# Patient Record
Sex: Female | Born: 1935
Health system: Southern US, Community
[De-identification: ages and names within clinical notes are randomized; demographics above are authoritative.]

## PROBLEM LIST (undated history)

## (undated) DIAGNOSIS — E119 Type 2 diabetes mellitus without complications: Secondary | ICD-10-CM

## (undated) DIAGNOSIS — I428 Other cardiomyopathies: Secondary | ICD-10-CM

## (undated) DIAGNOSIS — E875 Hyperkalemia: Secondary | ICD-10-CM

## (undated) DIAGNOSIS — Z9289 Personal history of other medical treatment: Secondary | ICD-10-CM

## (undated) DIAGNOSIS — Z972 Presence of dental prosthetic device (complete) (partial): Secondary | ICD-10-CM

## (undated) DIAGNOSIS — I10 Essential (primary) hypertension: Secondary | ICD-10-CM

## (undated) DIAGNOSIS — I951 Orthostatic hypotension: Secondary | ICD-10-CM

## (undated) DIAGNOSIS — K219 Gastro-esophageal reflux disease without esophagitis: Secondary | ICD-10-CM

## (undated) DIAGNOSIS — I493 Ventricular premature depolarization: Secondary | ICD-10-CM

## (undated) DIAGNOSIS — I00 Rheumatic fever without heart involvement: Secondary | ICD-10-CM

## (undated) DIAGNOSIS — I5042 Chronic combined systolic (congestive) and diastolic (congestive) heart failure: Secondary | ICD-10-CM

## (undated) DIAGNOSIS — E785 Hyperlipidemia, unspecified: Secondary | ICD-10-CM

## (undated) DIAGNOSIS — N184 Chronic kidney disease, stage 4 (severe): Secondary | ICD-10-CM

## (undated) DIAGNOSIS — I4891 Unspecified atrial fibrillation: Secondary | ICD-10-CM

## (undated) DIAGNOSIS — D649 Anemia, unspecified: Secondary | ICD-10-CM

## (undated) DIAGNOSIS — N189 Chronic kidney disease, unspecified: Secondary | ICD-10-CM

## (undated) HISTORY — DX: Hyperlipidemia, unspecified: E78.5

## (undated) HISTORY — DX: Unspecified atrial fibrillation: I48.91

## (undated) HISTORY — DX: Type 2 diabetes mellitus without complications: E11.9

## (undated) HISTORY — DX: Rheumatic fever without heart involvement: I00

## (undated) HISTORY — DX: Orthostatic hypotension: I95.1

## (undated) HISTORY — DX: Chronic kidney disease, unspecified: N18.9

## (undated) HISTORY — DX: Ventricular premature depolarization: I49.3

## (undated) HISTORY — PX: OVARIAN CYST REMOVAL: SHX89

## (undated) HISTORY — DX: Personal history of other medical treatment: Z92.89

## (undated) HISTORY — DX: Essential (primary) hypertension: I10

## (undated) HISTORY — DX: Hyperkalemia: E87.5

---

## 1999-02-28 ENCOUNTER — Other Ambulatory Visit: Admission: RE | Admit: 1999-02-28 | Discharge: 1999-02-28 | Payer: Self-pay | Admitting: *Deleted

## 2000-11-25 ENCOUNTER — Encounter: Payer: Self-pay | Admitting: Family Medicine

## 2000-11-25 ENCOUNTER — Encounter: Admission: RE | Admit: 2000-11-25 | Discharge: 2000-11-25 | Payer: Self-pay | Admitting: Family Medicine

## 2000-12-19 ENCOUNTER — Encounter: Payer: Self-pay | Admitting: Family Medicine

## 2000-12-19 ENCOUNTER — Encounter: Admission: RE | Admit: 2000-12-19 | Discharge: 2000-12-19 | Payer: Self-pay | Admitting: Family Medicine

## 2002-09-09 ENCOUNTER — Ambulatory Visit (HOSPITAL_COMMUNITY): Admission: RE | Admit: 2002-09-09 | Discharge: 2002-09-09 | Payer: Self-pay | Admitting: Gastroenterology

## 2002-09-09 ENCOUNTER — Encounter (INDEPENDENT_AMBULATORY_CARE_PROVIDER_SITE_OTHER): Payer: Self-pay | Admitting: *Deleted

## 2003-03-17 ENCOUNTER — Encounter: Payer: Self-pay | Admitting: Family Medicine

## 2003-03-17 ENCOUNTER — Encounter: Admission: RE | Admit: 2003-03-17 | Discharge: 2003-03-17 | Payer: Self-pay | Admitting: Family Medicine

## 2007-10-23 HISTORY — PX: KNEE SURGERY: SHX244

## 2007-11-03 ENCOUNTER — Encounter: Admission: RE | Admit: 2007-11-03 | Discharge: 2007-12-01 | Payer: Self-pay | Admitting: Sports Medicine

## 2007-12-04 ENCOUNTER — Encounter: Admission: RE | Admit: 2007-12-04 | Discharge: 2007-12-04 | Payer: Self-pay | Admitting: Nephrology

## 2007-12-16 ENCOUNTER — Encounter: Admission: RE | Admit: 2007-12-16 | Discharge: 2008-01-12 | Payer: Self-pay | Admitting: Orthopedic Surgery

## 2008-10-19 ENCOUNTER — Encounter: Admission: RE | Admit: 2008-10-19 | Discharge: 2008-10-19 | Payer: Self-pay | Admitting: Family Medicine

## 2010-08-04 ENCOUNTER — Other Ambulatory Visit: Admission: RE | Admit: 2010-08-04 | Discharge: 2010-08-04 | Payer: Self-pay | Admitting: Family Medicine

## 2010-08-23 ENCOUNTER — Emergency Department (HOSPITAL_BASED_OUTPATIENT_CLINIC_OR_DEPARTMENT_OTHER): Admission: EM | Admit: 2010-08-23 | Discharge: 2010-08-23 | Payer: Self-pay | Admitting: Emergency Medicine

## 2010-11-06 ENCOUNTER — Encounter
Admission: RE | Admit: 2010-11-06 | Discharge: 2010-11-06 | Payer: Self-pay | Source: Home / Self Care | Attending: Family Medicine | Admitting: Family Medicine

## 2011-01-10 ENCOUNTER — Other Ambulatory Visit: Payer: Self-pay | Admitting: *Deleted

## 2011-01-10 NOTE — Telephone Encounter (Signed)
Msg left to verify Benicar dose, how she is feeling and to make F/U with dr Acie Fredrickson. [facility] faxed ordered refill.  Corwin Levins RN 01/10/2011 4:07 PM

## 2011-01-11 ENCOUNTER — Telehealth: Payer: Self-pay | Admitting: Cardiovascular Disease

## 2011-01-11 ENCOUNTER — Other Ambulatory Visit: Payer: Self-pay | Admitting: *Deleted

## 2011-01-11 DIAGNOSIS — I10 Essential (primary) hypertension: Secondary | ICD-10-CM

## 2011-01-11 MED ORDER — OLMESARTAN MEDOXOMIL-HCTZ 40-25 MG PO TABS: 0.5000 | ORAL_TABLET | Freq: Every day | ORAL | Status: DC

## 2011-01-11 MED ORDER — TELMISARTAN-HCTZ 40-12.5 MG PO TABS: 1.0000 | ORAL_TABLET | Freq: Every day | ORAL | Status: DC

## 2011-01-11 NOTE — Telephone Encounter (Signed)
Pt calling stating the Benicar is causing her to feel flushed and a sunburn effect across her face.  She is requesting a new medication to try to see if this symptom resolves.  This was sent to the pharmacy and pt was advised.  She will continue to monitor her blood pressure and call with any problems.

## 2011-01-11 NOTE — Telephone Encounter (Signed)
Having trouble with script for Benacar HCT 40/25mg  #30, taking 1/2 tab daily.  Pharmacy is telling patient, they can only fill #15, it is illegal to fill for more than the directed dose.  Please call patient to discuss.

## 2011-02-06 ENCOUNTER — Telehealth: Payer: Self-pay | Admitting: Cardiovascular Disease

## 2011-02-06 NOTE — Telephone Encounter (Signed)
Wanted to schedule an appointment for a blood pressure medication check. Says that the medication she received last time isn't working and her blood pressure is still high. Please call back. I have pulled the chart.

## 2011-02-06 NOTE — Telephone Encounter (Signed)
Left msg for pt to make app. If needs follow up soon schedule with Truitt Merle NP or if can wait can schedule a week out with Dr Acie Fredrickson. Office number left to call us back.Corwin Levins RN

## 2011-03-09 NOTE — Op Note (Signed)
NAME:  Krista Mason, Krista Mason                           ACCOUNT NO.:  [id number]   MEDICAL RECORD NO.:  [id number]                   PATIENT TYPE:  AMB   LOCATION:  ENDO                                 FACILITY:  [facility]   PHYSICIAN:  Ronald Lobo, M.D.                DATE OF BIRTH:  [birth year]   DATE OF PROCEDURE:  09/09/2002  DATE OF DISCHARGE:                                 OPERATIVE REPORT   PROCEDURE PERFORMED:  Colonoscopy with polypectomy and biopsy.   ENDOSCOPIST:  Cleotis Nipper, M.D.   INDICATIONS FOR PROCEDURE:  The patient is a 75 year old female for colon  cancer screening.   FINDINGS:  Several small polyps removed.  Moderate sigmoid diverticulosis.   DESCRIPTION OF PROCEDURE:  The nature, purpose and risks of the procedure  had been discussed with the patient, who provided written consent.  Sedation  was fentanyl 100 mcg and Versed 10 mg IV without arrhythmias or  desaturation.   The Olympus adjustable tension pediatric video colonoscope was advanced with  mild difficulty through a somewhat fixated  sigmoid region around the colon  to the cecum, and then into the base of the cecum as identified by  visualization of the appendiceal orifice.  Pullback was performed  thereafter.  The quality of the prep was excellent and it is felt that all  areas were well seen.   In the cecum were two polyps.  The larger was about 4 mm, semipedunculated  in morphology, erythematous and removed by snare technique with minimal  oozing, and with the edge being trimmed with a little bit of cold biopsy  technique.  Adjacent to this was a 2 mm sessile polyp removed by cold biopsy  technique.  There were two diminutive sessile hyperplastic appearing polyps  in the rectosigmoid at about 15 to 20 cm and moderately severe sigmoid  diverticulosis with associated muscular thickening.  Retroflexion could not  be accomplished in the rectum due to a small rectal ampulla but antegrade  viewing and  reinspection of the rectum was unremarkable.  No large polyps,  cancer, colitis or significant vascular ectasia were observed but there were  some early mild changes consistent with vascular ectasia in the cecum.  The  patient tolerated the procedure well and there were no apparent  complications.   IMPRESSION:  1. Several colon polyps removed as described above.  2. Moderate sigmoid diverticulosis.  3. Questionable early vascular ectasia in the cecum.   PLAN:  Await pathology on the polyps.                                              Ronald Lobo, M.D.   RB/MEDQ  D:  09/09/2002  T:  09/09/2002  Job:  [id number]   cc:  Tammy R. Modena Morrow, M.D.

## 2011-06-22 ENCOUNTER — Other Ambulatory Visit: Payer: Self-pay

## 2011-08-13 ENCOUNTER — Other Ambulatory Visit: Payer: Self-pay

## 2011-11-29 ENCOUNTER — Other Ambulatory Visit: Payer: Self-pay | Admitting: Physician Assistant

## 2012-02-28 ENCOUNTER — Encounter: Payer: Self-pay | Admitting: *Deleted

## 2012-04-15 ENCOUNTER — Emergency Department (HOSPITAL_COMMUNITY)
Admission: EM | Admit: 2012-04-15 | Discharge: 2012-04-15 | Disposition: A | Discharge status: Home / Self Care | Payer: Medicare Other | Attending: Emergency Medicine | Admitting: Emergency Medicine

## 2012-04-15 DIAGNOSIS — R079 Chest pain, unspecified: Secondary | ICD-10-CM | POA: Insufficient documentation

## 2012-04-15 DIAGNOSIS — Z79899 Other long term (current) drug therapy: Secondary | ICD-10-CM | POA: Insufficient documentation

## 2012-04-15 DIAGNOSIS — R000 Tachycardia, unspecified: Secondary | ICD-10-CM | POA: Insufficient documentation

## 2012-04-15 DIAGNOSIS — R7309 Other abnormal glucose: Secondary | ICD-10-CM | POA: Insufficient documentation

## 2012-04-15 DIAGNOSIS — R739 Hyperglycemia, unspecified: Secondary | ICD-10-CM

## 2012-04-15 DIAGNOSIS — E785 Hyperlipidemia, unspecified: Secondary | ICD-10-CM | POA: Insufficient documentation

## 2012-04-15 DIAGNOSIS — I10 Essential (primary) hypertension: Secondary | ICD-10-CM | POA: Insufficient documentation

## 2012-04-15 DIAGNOSIS — E860 Dehydration: Secondary | ICD-10-CM

## 2012-04-15 LAB — DIFFERENTIAL
Basophils Absolute: 0 10*3/uL (ref 0.0–0.1)
Basophils Relative: 0 % (ref 0–1)
Eosinophils Relative: 1 % (ref 0–5)
Lymphocytes Relative: 13 % (ref 12–46)
Monocytes Absolute: 0.5 10*3/uL (ref 0.1–1.0)

## 2012-04-15 LAB — URINALYSIS, ROUTINE W REFLEX MICROSCOPIC
Bilirubin Urine: NEGATIVE
Ketones, ur: NEGATIVE mg/dL
Nitrite: NEGATIVE
Protein, ur: NEGATIVE mg/dL
Urobilinogen, UA: 0.2 mg/dL (ref 0.0–1.0)
pH: 5.5 (ref 5.0–8.0)

## 2012-04-15 LAB — CBC
HCT: 36.7 % (ref 36.0–46.0)
MCHC: 35.1 g/dL (ref 30.0–36.0)
MCV: 84.4 fL (ref 78.0–100.0)
Platelets: 273 10*3/uL (ref 150–400)
RDW: 13.9 % (ref 11.5–15.5)
WBC: 10.5 10*3/uL (ref 4.0–10.5)

## 2012-04-15 LAB — BASIC METABOLIC PANEL
BUN: 17 mg/dL (ref 6–23)
Calcium: 9.4 mg/dL (ref 8.4–10.5)
Creatinine, Ser: 1.31 mg/dL — ABNORMAL HIGH (ref 0.50–1.10)
GFR calc Af Amer: 45 mL/min — ABNORMAL LOW (ref >90)

## 2012-04-15 LAB — URINE MICROSCOPIC-ADD ON

## 2012-04-15 MED ORDER — SODIUM CHLORIDE 0.9 % IV SOLN: INTRAVENOUS | Status: DC

## 2012-04-15 MED ORDER — SODIUM CHLORIDE 0.9 % IV BOLUS (SEPSIS)
1000.0000 mL | Freq: Once | INTRAVENOUS | Status: AC
  Administered 2012-04-15: 1000 mL via INTRAVENOUS

## 2012-04-15 NOTE — ED Provider Notes (Addendum)
History     CSN: [id number]  Arrival date & time 04/15/12  1402   First MD Initiated Contact with Patient 04/15/12 1419      No chief complaint on file.   (Consider location/radiation/quality/duration/timing/severity/associated sxs/prior treatment) HPI Comments: Krista Mason is a 76 y.o. Female who describes Hx tightness on-and-off for 2 weeks, somewhat more persistent in the last 2 days. She saw her doctor today, and they felt that she needed further workup because of tachycardia and the chest discomfort. She had blood work done 5 days ago, which showed mild electrolyte abnormalities in living, elevated BUN, creatinine and glucose; and decreased sodium. On transfer here she was treated with aspirin, and sublingual nitroglycerin x1 without change in her discomfort. The patient is trying to lose weight. She also feels that her mouth is dry. She has no change in bowel or urinary habits. She does not know of any palliative or alleviating factors.  The history is provided by the patient.    Past Medical History  Diagnosis Date  . Orthostatic hypotension   . Hypertension   . PVC (premature ventricular contraction)   . Hyperkalemia   . Sleep apnea   . Hyperlipidemia   . Rheumatic fever     Past Surgical History  Procedure Date  . Ovarian cyst removal     Family History  Problem Relation Age of Onset  . Heart attack Father   . Heart disease Father   . Hypertension Brother   . Stroke Brother   . Hypertension Brother     History  Substance Use Topics  . Smoking status: Not on file  . Smokeless tobacco: Not on file  . Alcohol Use: No    OB History    Grav Para Term Preterm Abortions TAB SAB Ect Mult Living                  Review of Systems  All other systems reviewed and are negative.    Allergies  Ceclor; Epinephrine; Other; and Percocet  Home Medications   Current Outpatient Rx  Name Route Sig Dispense Refill  . ACETAMINOPHEN 325 MG PO TABS Oral Take 325  mg by mouth every 6 (six) hours as needed. For pain    . ASPIRIN 81 MG PO TABS Oral Take 81 mg by mouth daily.    [name] VITAMIN D 1000 UNITS PO TABS Oral Take 2,000 Units by mouth daily.     [name] LOSARTAN POTASSIUM-HCTZ 50-12.5 MG PO TABS Oral Take 1 tablet by mouth daily.    [name] METFORMIN HCL 500 MG PO TABS Oral Take 500 mg by mouth 2 (two) times daily with a meal.    . SYSTANE OP Ophthalmic Apply 2 drops to eye 2 (two) times daily as needed. For dry eyes      BP 170/74  Pulse 89  Temp 97.4 F (36.3 C) (Oral)  Resp 22  SpO2 100%  Physical Exam  Nursing note and vitals reviewed. Constitutional: She is oriented to person, place, and time. She appears well-developed and well-nourished. No distress.  HENT:  Head: Normocephalic and atraumatic.  Eyes: Conjunctivae and EOM are normal. Pupils are equal, round, and reactive to light.  Neck: Normal range of motion and phonation normal. Neck supple.  Cardiovascular: Regular rhythm, normal heart sounds and intact distal pulses.        Mild tachycardia  Pulmonary/Chest: Effort normal and breath sounds normal. She exhibits no tenderness.  Abdominal: Soft. She exhibits no distension. There  is no tenderness. There is no guarding.  Musculoskeletal: Normal range of motion.  Neurological: She is alert and oriented to person, place, and time. She has normal strength. She exhibits normal muscle tone.  Skin: Skin is warm and dry.  Psychiatric: She has a normal mood and affect. Her behavior is normal. Judgment and thought content normal.    ED Course  Procedures (including critical care time)   Date: 04/15/2012  Rate: 106  Rhythm: sinus tachycardia  QRS Axis: normal  Intervals: first degree AV block  ST/T Wave abnormalities: normal  Conduction Disutrbances:none  Narrative Interpretation:   Old EKG Reviewed: none available   Reevaluation: 16:30- She was observed in the ED, off oxygen with a normal oxygen saturation. The pulse gradually improved to 89  on examination. Trial. She walked without desaturation. She feels better, with no complaints of chest pain, or discomfort.  Labs Reviewed  BASIC METABOLIC PANEL - Abnormal; Notable for the following:    Sodium 131 (*)     Chloride 95 (*)     Glucose, Bld 159 (*)     Creatinine, Ser 1.31 (*)     GFR calc non Af Amer 39 (*)     GFR calc Af Amer 45 (*)     All other components within normal limits  DIFFERENTIAL - Abnormal; Notable for the following:    Neutrophils Relative 82 (*)     Neutro Abs 8.6 (*)     All other components within normal limits  URINALYSIS, ROUTINE W REFLEX MICROSCOPIC - Abnormal; Notable for the following:    APPearance HAZY (*)     Hgb urine dipstick SMALL (*)     Leukocytes, UA SMALL (*)     All other components within normal limits  PRO B NATRIURETIC PEPTIDE - Abnormal; Notable for the following:    Pro B Natriuretic peptide (BNP) 234.4 (*)     All other components within normal limits  URINE MICROSCOPIC-ADD ON - Abnormal; Notable for the following:    Squamous Epithelial / LPF MANY (*)     Bacteria, UA FEW (*)     All other components within normal limits  CBC  TROPONIN I   No results found.   1. Chest pain   2. Dehydration   3. Hyperglycemia       MDM  Nonspecific chest discomfort, with reassuring. Evaluation in the emergency department. She has mild, hyperglycemia, and electrolyte abnormalities, consistent with dehydration, secondary to elevated glucose. No DKA. No evident infection. Doubt ACS, PE, or pneumonia .  She is stable for discharge with outpatient management.   Plan: Home Medications- usual; Home Treatments- increase fluids; Recommended follow up- call PCP to discuss treatment plans- e.g. Increasing Metformin and further CP evaluation        Richarda Blade, MD 04/15/12 [place], MD 04/16/12 [id number]

## 2012-04-15 NOTE — ED Notes (Signed)
Pt in via [facility], pt from [facility], pt c/o Chest heaviness, pt states at night she gains about 4lbs & swells in the ankles, pt A&O x4, follows commands, speaks in complete sentences, prior to arrival she experienced several PVCs & stays in abnormal rhythm, pt states she has a leaky valve, pt denies SOB & n/v/d, pt c/o dizziness, pt rcvd 324 ASA prior to arrival & x1 SL nitro

## 2012-04-15 NOTE — Discharge Instructions (Signed)
Get plenty of rest, and drink extra fluids. You appear to be somewhat dehydrated. Because your glucose is elevated. Ask your doctor if you should double your metformin to improve your hyperglycemia. Return here if needed for problems         Chest Pain (Nonspecific) It is often hard to give a specific diagnosis for the cause of chest pain. There is always a chance that your pain could be related to something serious, such as a heart attack or a blood clot in the lungs. You need to follow up with your caregiver for further evaluation. CAUSES   Heartburn.   Pneumonia or bronchitis.   Anxiety or stress.   Inflammation around your heart (pericarditis) or lung (pleuritis or pleurisy).   A blood clot in the lung.   A collapsed lung (pneumothorax). It can develop suddenly on its own (spontaneous pneumothorax) or from injury (trauma) to the chest.   Shingles infection (herpes zoster virus).  The chest wall is composed of bones, muscles, and cartilage. Any of these can be the source of the pain.  The bones can be bruised by injury.   The muscles or cartilage can be strained by coughing or overwork.   The cartilage can be affected by inflammation and become sore (costochondritis).  DIAGNOSIS  Lab tests or other studies, such as X-rays, electrocardiography, stress testing, or cardiac imaging, may be needed to find the cause of your pain.  TREATMENT   Treatment depends on what may be causing your chest pain. Treatment may include:   Acid blockers for heartburn.   Anti-inflammatory medicine.   Pain medicine for inflammatory conditions.   Antibiotics if an infection is present.   You may be advised to change lifestyle habits. This includes stopping smoking and avoiding alcohol, caffeine, and chocolate.   You may be advised to keep your head raised (elevated) when sleeping. This reduces the chance of acid going backward from your stomach into your esophagus.   Most of the time,  nonspecific chest pain will improve within 2 to 3 days with rest and mild pain medicine.  HOME CARE INSTRUCTIONS   If antibiotics were prescribed, take your antibiotics as directed. Finish them even if you start to feel better.   For the next few days, avoid physical activities that bring on chest pain. Continue physical activities as directed.   Do not smoke.   Avoid drinking alcohol.   Only take over-the-counter or prescription medicine for pain, discomfort, or fever as directed by your caregiver.   Follow your caregiver's suggestions for further testing if your chest pain does not go away.   Keep any follow-up appointments you made. If you do not go to an appointment, you could develop lasting (chronic) problems with pain. If there is any problem keeping an appointment, you must call to reschedule.  SEEK MEDICAL CARE IF:   You think you are having problems from the medicine you are taking. Read your medicine instructions carefully.   Your chest pain does not go away, even after treatment.   You develop a rash with blisters on your chest.  SEEK IMMEDIATE MEDICAL CARE IF:   You have increased chest pain or pain that spreads to your arm, neck, jaw, back, or abdomen.   You develop shortness of breath, an increasing cough, or you are coughing up blood.   You have severe back or abdominal pain, feel nauseous, or vomit.   You develop severe weakness, fainting, or chills.   You have a  fever.  THIS IS AN EMERGENCY. Do not wait to see if the pain will go away. Get medical help at once. Call your local emergency services (911 in [place] Do not drive yourself to the hospital. MAKE SURE YOU:   Understand these instructions.   Will watch your condition.   Will get help right away if you are not doing well or get worse.  Document Released: 07/18/2005 Document Revised: 09/27/2011 Document Reviewed: 05/13/2008 [facility] Patient Information 2012 [place].

## 2012-04-15 NOTE — ED Notes (Signed)
Patient ambulated to  & back to room with minimal assistance, while maintaining 99% O2.

## 2012-07-01 ENCOUNTER — Encounter: Payer: Self-pay | Admitting: Cardiovascular Disease

## 2012-11-05 ENCOUNTER — Other Ambulatory Visit: Payer: Self-pay | Admitting: Family Medicine

## 2012-11-05 DIAGNOSIS — R0989 Other specified symptoms and signs involving the circulatory and respiratory systems: Secondary | ICD-10-CM

## 2012-11-05 DIAGNOSIS — R6889 Other general symptoms and signs: Secondary | ICD-10-CM

## 2012-11-10 ENCOUNTER — Ambulatory Visit
Admission: RE | Admit: 2012-11-10 | Discharge: 2012-11-10 | Disposition: A | Discharge status: Home / Self Care | Payer: Medicare Other | Source: Ambulatory Visit | Attending: Family Medicine | Admitting: Family Medicine

## 2012-11-10 DIAGNOSIS — R0989 Other specified symptoms and signs involving the circulatory and respiratory systems: Secondary | ICD-10-CM

## 2012-11-10 DIAGNOSIS — R6889 Other general symptoms and signs: Secondary | ICD-10-CM

## 2012-11-20 ENCOUNTER — Other Ambulatory Visit: Payer: Self-pay | Admitting: *Deleted

## 2012-11-20 DIAGNOSIS — I739 Peripheral vascular disease, unspecified: Secondary | ICD-10-CM

## 2012-12-03 ENCOUNTER — Encounter: Payer: Medicare Other | Admitting: Vascular Surgery

## 2012-12-12 ENCOUNTER — Encounter: Payer: Self-pay | Admitting: Surgery

## 2012-12-15 ENCOUNTER — Ambulatory Visit (INDEPENDENT_AMBULATORY_CARE_PROVIDER_SITE_OTHER): Payer: Medicare Other | Admitting: Surgery

## 2012-12-15 ENCOUNTER — Encounter (INDEPENDENT_AMBULATORY_CARE_PROVIDER_SITE_OTHER): Payer: Medicare Other | Admitting: *Deleted

## 2012-12-15 ENCOUNTER — Encounter: Payer: Self-pay | Admitting: Surgery

## 2012-12-15 VITALS — BP 164/99 | HR 102 | Ht 65.0 in | Wt 151.0 lb

## 2012-12-15 DIAGNOSIS — I739 Peripheral vascular disease, unspecified: Secondary | ICD-10-CM | POA: Insufficient documentation

## 2012-12-15 NOTE — Progress Notes (Signed)
[facility]   Patient name: Krista Mason MRN: [id number] DOB: 1935-10-30 Sex: female   Referred by: Dr. Drema Dallas  Reason for referral:  Chief Complaint  Patient presents with  . New Evaluation    decreased pedal pulses - Dr. Leighton Ruff pt c/o of constant numbness in L LE    HISTORY OF PRESENT ILLNESS: The patient is referred for evaluation of left leg peripheral vascular disease. The patient states that she has been having issues with numbness in her left leg for the past 3-4 years. She states that this feeling can come on at any time. It occurs with standing. It occurs with walking. It also occurs with sitting. She states that she does not experience leg cramping with walking. She does get cramps at night. She denies nonhealing ulcers. Although, it did take her about a year to heal A.) 10 nail removal on her right great toe.  The patient suffers from hypercholesterolemia. She has been on a statin in the past but discontinued this recently do to nausea. She is medically managed for type 2 diabetes. She does have chronic renal insufficiency.  Past Medical History  Diagnosis Date  . Orthostatic hypotension   . Hypertension   . PVC (premature ventricular contraction)   . Hyperkalemia   . Sleep apnea   . Hyperlipidemia   . Rheumatic fever   . Atrial fibrillation   . Diabetes mellitus without complication   . Chronic kidney disease     Past Surgical History  Procedure Laterality Date  . Ovarian cyst removal    . Knee surgery Left 2009    History   Social History  . Marital Status: Widowed    Spouse Name: N/A    Number of Children: N/A  . Years of Education: N/A   Occupational History  . Not on file.   Social History Main Topics  . Smoking status: Current Every Day Smoker -- 0.50 packs/day for 30 years  . Smokeless tobacco: Never Used     Comment: pt states " I'm thinking about it"  . Alcohol Use: No  . Drug Use: No  . Sexually  Active: Not on file   Other Topics Concern  . Not on file   Social History Narrative  . No narrative on file    Family History  Problem Relation Age of Onset  . Heart attack Father   . Heart disease Father   . Cancer Father   . Hypertension Father   . Hypertension Brother   . Stroke Brother   . Hypertension Brother   . Diabetes Son   . Hyperlipidemia Son   . Hypertension Son     Allergies as of 12/15/2012 - Review Complete 12/15/2012  Allergen Reaction Noted  . Penicillins  12/15/2012  . Ceclor (cefaclor) Itching 02/28/2012  . Epinephrine Other (See Comments) 04/15/2012  . Nsaids  12/15/2012  . Other Other (See Comments) 04/15/2012  . Percocet (oxycodone-acetaminophen) Nausea And Vomiting 02/28/2012  . Prednisone  12/15/2012    Current Outpatient Prescriptions on File Prior to Visit  Medication Sig Dispense Refill  . acetaminophen (TYLENOL) 325 MG tablet Take 325 mg by mouth every 6 (six) hours as needed. For pain      . cholecalciferol (VITAMIN D) 1000 UNITS tablet Take 2,000 Units by mouth daily.       [name] losartan-hydrochlorothiazide (HYZAAR) 50-12.5 MG per tablet Take 1 tablet by mouth daily.      . metFORMIN (GLUCOPHAGE) 500  MG tablet Take 500 mg by mouth 2 (two) times daily with a meal.      . aspirin 81 MG tablet Take 81 mg by mouth daily.      Vladimir Faster Glycol-Propyl Glycol (SYSTANE OP) Apply 2 drops to eye 2 (two) times daily as needed. For dry eyes       No current facility-administered medications on file prior to visit.     REVIEW OF SYSTEMS: Cardiovascular: No chest pain, chest pressure, palpitations, positive for pain in legs with walking Pulmonary: No productive cough, asthma or wheezing. Neurologic: Positive for weakness in her legs and numbness.  Hematologic: No bleeding problems or clotting disorders. Musculoskeletal: No joint pain or joint swelling. Gastrointestinal: No blood in stool or hematemesis Genitourinary: No dysuria or  hematuria. Psychiatric:: No history of major depression. Integumentary: No rashes or ulcers. Constitutional: No fever or chills.  PHYSICAL EXAMINATION: General: The patient appears their stated age.  Vital signs are BP 164/99  Pulse 102  Ht 5\' 5"  (1.651 m)  Wt 151 lb (68.493 kg)  BMI 25.13 kg/m2  SpO2 100% HEENT:  No gross abnormalities Pulmonary: Respirations are non-labored Abdomen: Soft and non-tender no carotid bruits. Musculoskeletal: There are no major deformities.   Neurologic: No focal weakness or paresthesias are detected, Skin: There are no ulcer or rashes noted. Psychiatric: The patient has normal affect. Cardiovascular: There is a regular rate and rhythm without significant murmur appreciated. No carotid bruits. I cannot palpate pedal pulses.  Diagnostic Studies: Outside ultrasound revealed an ABI of 0.94 on the right and 0.73 on the left. This was confirmed that our office  Outside Studies/Documentation Historical records were reviewed.  They showed left leg peripheral vascular disease  Medication Changes: I have recommended that the patient restart a statin.  Assessment:  Peripheral Vascular disease, left leg Plan: The patient's biggest complaint is that of numbness of her left leg. Based on her symptomatology, I do not feel that this complaint is do to vascular disease. She does have a history of back problems which is the most likely source. Regardless, her ultrasound findings  show that she does have some underlying vascular insufficiency on the left leg. She is not at the point that she would require treatment for this other than medical management. I have stressed the importance of restarting a statin. We also discussed the importance of smoking cessation. I also told her that with her diabetes and underlying vascular disease that she must examine her feet every night. Any wound on her feet should be aggressively managed. I will have her followup with me in one  year with a repeat ultrasound     V. Leia Alf, M.D. [facility] [place] [place] Office: [phone] Pager:  [phone]

## 2012-12-15 NOTE — Addendum Note (Signed)
Addended by: Reola Calkins on: 12/15/2012 04:25 PM   Modules accepted: Orders

## 2013-12-21 ENCOUNTER — Encounter (HOSPITAL_COMMUNITY): Payer: Medicare Other

## 2013-12-21 ENCOUNTER — Ambulatory Visit: Payer: Medicare Other | Admitting: Family

## 2014-10-21 ENCOUNTER — Ambulatory Visit (INDEPENDENT_AMBULATORY_CARE_PROVIDER_SITE_OTHER): Payer: Medicare HMO | Admitting: Physician Assistant

## 2014-10-21 ENCOUNTER — Encounter: Payer: Self-pay | Admitting: Physician Assistant

## 2014-10-21 VITALS — BP 156/88 | HR 111 | Ht 66.0 in | Wt 153.0 lb

## 2014-10-21 DIAGNOSIS — I10 Essential (primary) hypertension: Secondary | ICD-10-CM

## 2014-10-21 DIAGNOSIS — I739 Peripheral vascular disease, unspecified: Secondary | ICD-10-CM

## 2014-10-21 DIAGNOSIS — Z720 Tobacco use: Secondary | ICD-10-CM

## 2014-10-21 DIAGNOSIS — I471 Supraventricular tachycardia: Secondary | ICD-10-CM

## 2014-10-21 DIAGNOSIS — R5383 Other fatigue: Secondary | ICD-10-CM

## 2014-10-21 DIAGNOSIS — E785 Hyperlipidemia, unspecified: Secondary | ICD-10-CM

## 2014-10-21 MED ORDER — DILTIAZEM HCL ER COATED BEADS 120 MG PO CP24: 120.0000 mg | ORAL_CAPSULE | Freq: Every day | ORAL | Status: DC

## 2014-10-21 NOTE — Progress Notes (Addendum)
Cardiology Office Note   Date:  10/21/2014   ID:  Krista Mason, DOB March 28, 1936, MRN [id number]  PCP:  Gerrit Heck, MD  Cardiologist:  Dr. 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  She was evaluated by Dr. Trula Slade in 2014 for PAD.  ABIs: R 0.94 and L 0.73.  Medical Rx was recommended at that time.  She has not seen Dr. Acie Fredrickson since 2010.  She saw her PCP today.  She was noted to be tachycardic.  Her ECG was sent over for Dr. Sherren Mocha (DOD) to read.  She was added on to my schedule for evaluation.  She notes a [place] hx of rapid palpitations.  She does not feel like these have worsened any.  However, she has noted increased fatigue since Thanksgiving this year.  She denies chest pain. She denies significant dyspnea.  She denies syncope or near syncope.  She denies orthopnea, PND, edema.  She is a widow and lives alone.  She is not certain if she snores.  She does take frequent naps during the day.     Studies:   - Echo (10/08):  EF 55-60%, mild LVH, impaired LV relaxation, mild aortic stenosis (mean 5.3 mmHg) , mild to moderate aortic insufficiency, mild MR, mild TR   Recent Labs: No results found for requested labs within last 365 days.    Recent Radiology: No results found.    Wt Readings from Last 3 Encounters:  10/21/14 153 lb (69.4 kg)  12/15/12 151 lb (68.493 kg)     Past Medical History  Diagnosis Date  . Orthostatic hypotension   . Hypertension   . PVC (premature ventricular contraction)   . Hyperkalemia   . Sleep apnea   . Hyperlipidemia   . Rheumatic fever   . Atrial fibrillation   . Diabetes mellitus without complication   . Chronic kidney disease     Current Outpatient Prescriptions  Medication Sig Dispense Refill  . acetaminophen (TYLENOL) 325 MG tablet Take 325 mg by mouth every 6 (six) hours as needed. For pain    .  aspirin 81 MG tablet Take 81 mg by mouth daily.    . cholecalciferol (VITAMIN D) 1000 UNITS tablet Take 2,000 Units by mouth daily.     [name] esomeprazole (NEXIUM) 40 MG capsule Take 40 mg by mouth as needed.    . Imiquimod (ZYCLARA PUMP) 2.5 % CREA Apply topically as needed.    [name] losartan-hydrochlorothiazide (HYZAAR) 50-12.5 MG per tablet Take 1 tablet by mouth daily.    [name] lovastatin (MEVACOR) 20 MG tablet   0  . metFORMIN (GLUCOPHAGE) 500 MG tablet Take 500 mg by mouth 2 (two) times daily with a meal.    . minocycline (DYNACIN) 50 MG tablet Take 50 mg by mouth daily.    Vladimir Faster Glycol-Propyl Glycol (SYSTANE OP) Apply 2 drops to eye 2 (two) times daily as needed. For dry eyes     No current facility-administered medications for this visit.     Allergies:   Penicillins; Ceclor; Epinephrine; Nsaids; Other; Percocet; and Prednisone   Social History:  The patient  reports that she has been smoking.  She has never used smokeless tobacco. She reports that she does not drink alcohol or use illicit drugs.   Family History:  The patient's family history includes Cancer in her father;  Diabetes in her son; Heart attack in her father; Heart disease in her father; Hyperlipidemia in her son; Hypertension in her brother, brother, father, and son; Stroke in her brother.    ROS:  Please see the history of present illness.       All other systems reviewed and negative.    PHYSICAL EXAM: VS:  BP 156/88 mmHg  Pulse 111  Ht 5\' 6"  (1.676 m)  Wt 153 lb (69.4 kg)  BMI 24.71 kg/m2 Well nourished, well developed, in no acute distress HEENT: normal Neck:  no JVD Cardiac:  normal S1, S2;  Irregularly irregular rhythm; no murmur   Lungs:   clear to auscultation bilaterally, no wheezing, rhonchi or rales Abd: soft, nontender, no hepatomegaly Ext:  no edema Skin: warm and dry Neuro:  CNs 2-12 intact, no focal abnormalities noted  EKG:  Multifocal atrial tachycardia, HR 111, normal axis, NSSTTW changes         ASSESSMENT AND PLAN: No diagnosis found.  1.  Multifactorial Atrial Tachycardia:  She has a [place] smoking hx.  Although she does not have a lot of symptoms, I would not be surprised if she has COPD.  I suspect her symptoms of fatigue may be explained by the tachycardia.      -  Schedule 2D echo.    -  Start Diltiazem CD 120 mg QD.    -  Labs from PCP pending:  CMET, TSH, CBC 2.  Fatigue:  I suspect this is related to her MAT.  She may have OSA.  Will have to keep this in mind.  Also she has a lot of risk factors for CAD.    -  Consider sleep study if echo shows pulmonary HTN or atriopathy.    -  Consider stress testing if symptoms continue or worsen. 3.  Hypertension:  BP elevated today and should be able to tolerate Diltiazem.   4.  Hyperlipidemia:  Continue statin. 5.  Tobacco Abuse:  I have advised her to quit.  Consider PFTs.  Defer this to PCP. 6.  PAD:  Given her risk factors, I would suggest she take an ASA a day.    -  Start ASA 81 mg QD.   Disposition:   FU with Dr. Liam Rogers 3-4 weeks.    Signed, Versie Starks, MHS 10/21/2014 2:07 PM    [facility] [address], [place], [place]  [address] Phone: [phone]; Fax: [phone]   Patient seen, examined. Available data reviewed. Agree with findings, assessment, and plan as outlined by Richardson Dopp, PA-C. The patient was independently interviewed and examined. Exam reveals an alert, oriented woman in NAD. Lungs are clear bilaterally. Heart is irregularly irregular and tachycardiac without murmur. There is no abdominal tenderness and no peripheral edema.   EKG reviewed and shows MAT. Suspect this is responsible for her progressive fatigue. Labs reviewed from Dr Drema Dallas' office with no major abnormalities noted. Agree with further diagnostic and treatment plan as outlined above.   Sherren Mocha, M.D. 10/23/2014 11:03 PM

## 2014-10-21 NOTE — Patient Instructions (Addendum)
Your EKG today shows Multifocal Atrial Tachycardia.  This causes your heart to run fast.  I have placed you on Cardizem CD 120 mg once daily.  This should help.  Call me back if your BP starts to run low with this.  I don't think that will happen.  But, if it does, call me back.   Also, start Aspirin 81 mg daily.   We will get a copy of the lab tests done by Gerrit Heck, MD.  I would like for you to get an Echocardiogram to look at your heart chamber sizes and the your left and right heart function.  Call 1-800-QUIT-NOW [phone]) for help with quitting smoking.   Schedule follow up with Dr. Liam Rogers in 3-4 weeks.  Your physician has requested that you have an echocardiogram. Echocardiography is a painless test that uses sound waves to create images of your heart. It provides your doctor with information about the size and shape of your heart and how well your heart's chambers and valves are working. This procedure takes approximately one hour. There are no restrictions for this procedure.

## 2014-10-26 ENCOUNTER — Encounter: Payer: Self-pay | Admitting: Physician Assistant

## 2014-10-26 ENCOUNTER — Ambulatory Visit (HOSPITAL_COMMUNITY): Payer: Medicare HMO | Attending: Cardiovascular Disease | Admitting: Cardiology

## 2014-10-26 DIAGNOSIS — I471 Supraventricular tachycardia: Secondary | ICD-10-CM

## 2014-10-26 NOTE — Progress Notes (Signed)
Echo performed. 

## 2014-10-27 ENCOUNTER — Encounter: Payer: Self-pay | Admitting: Physician Assistant

## 2014-10-27 ENCOUNTER — Telehealth: Payer: Self-pay | Admitting: *Deleted

## 2014-10-27 NOTE — Telephone Encounter (Signed)
pt notified about echo results with verbal understanding . Pt said she is still having some palpitations, but does want to stay on the cardizem, I sent in refill. Pt read to me BP, HR readings which were normal. I advised pt if HR > 100 to call office. Pt said ok and is agreeable to Plan of care. Verified appt with Dr. Acie Fredrickson 2/5.

## 2014-11-03 ENCOUNTER — Encounter: Payer: Self-pay | Admitting: Cardiovascular Disease

## 2014-11-26 ENCOUNTER — Encounter: Payer: Self-pay | Admitting: Cardiovascular Disease

## 2014-11-26 ENCOUNTER — Ambulatory Visit (INDEPENDENT_AMBULATORY_CARE_PROVIDER_SITE_OTHER): Payer: Medicare HMO | Admitting: Cardiovascular Disease

## 2014-11-26 VITALS — BP 135/90 | HR 98 | Ht 66.0 in | Wt 152.4 lb

## 2014-11-26 DIAGNOSIS — I739 Peripheral vascular disease, unspecified: Secondary | ICD-10-CM

## 2014-11-26 DIAGNOSIS — I471 Supraventricular tachycardia: Secondary | ICD-10-CM

## 2014-11-26 MED ORDER — DILTIAZEM HCL ER BEADS 120 MG PO CP24: 120.0000 mg | ORAL_CAPSULE | Freq: Every day | ORAL | Status: DC

## 2014-11-26 MED ORDER — TAZTIA XT 120 MG PO CP24: 120.0000 mg | ORAL_CAPSULE | Freq: Every day | ORAL | Status: DC

## 2014-11-26 NOTE — Progress Notes (Signed)
Cardiology Office Note   Date:  11/26/2014   ID:  Krista Mason, DOB Apr 07, 1936, MRN [id number]  PCP:  Gerrit Heck, MD  Cardiologist:   Thayer Headings, MD   Chief Complaint  Patient presents with  . Follow-up    HTN    P roblem List 1. Hypertension 2. Hyperlipidemia 3. Diabetes mellitus 4. Chronic kidney disease 5. Multifocal atrial tachycardia   History of Present Illness: Krista Mason is a 79 y.o. 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 She was evaluated by Dr. Trula Slade in 2014 for PAD. ABIs: R 0.94 and L 0.73. Medical Rx was recommended at that time. I last saw me  in 2010.   she was recent seen by Nicki Reaper we are for episodes of tachycardia. Her EKG showed multifocal atrial tachycardia. This was probably related to her COPD.  The MAT has improved.  She is still taking the Diltiazem .   No CP or dyspnea.  Has had some left  knee surgery     Past Medical History  Diagnosis Date  . Orthostatic hypotension   . Hypertension   . PVC (premature ventricular contraction)   . Hyperkalemia   . Sleep apnea   . Hyperlipidemia   . Rheumatic fever   . Atrial fibrillation   . Diabetes mellitus without complication   . Chronic kidney disease   . Hx of echocardiogram     Echo (1/16):  EF 60-65%, no RWMA, Gr 1 DD, mod AI, mod MR, mild LAE    Past Surgical History  Procedure Laterality Date  . Ovarian cyst removal    . Knee surgery Left 2009     Current Outpatient Prescriptions  Medication Sig Dispense Refill  . acetaminophen (TYLENOL) 325 MG tablet Take 325 mg by mouth every 6 (six) hours as needed. For pain    . aspirin 81 MG tablet Take 81 mg by mouth daily.    . cholecalciferol (VITAMIN D) 1000 UNITS tablet Take 2,000 Units by mouth daily.     [name] esomeprazole (NEXIUM) 40 MG capsule Take 40 mg by mouth as needed.    . Imiquimod (ZYCLARA PUMP) 2.5 % CREA  Apply topically as needed.    [name] losartan (COZAAR) 50 MG tablet Take 50 mg by mouth daily.  1  . lovastatin (MEVACOR) 20 MG tablet Take 20 mg by mouth.   0  . metFORMIN (GLUCOPHAGE) 500 MG tablet Take 500 mg by mouth 2 (two) times daily with a meal.    . Polyethyl Glycol-Propyl Glycol (SYSTANE OP) Apply 2 drops to eye 2 (two) times daily as needed. For dry eyes    . TAZTIA XT 120 MG 24 hr capsule Take 120 mg by mouth daily.     No current facility-administered medications for this visit.    Allergies:   Penicillins; Ceclor; Epinephrine; Nsaids; Other; Percocet; and Prednisone    Social History:  The patient  reports that she has been smoking.  She has never used smokeless tobacco. She reports that she does not drink alcohol or use illicit drugs.   Family History:  The patient's family history includes Cancer in her father; Diabetes in her son; Heart attack in her father; Heart disease in her father; Hyperlipidemia in her son; Hypertension in her brother, brother, father, and son; Stroke in her brother.    ROS:  Please see the  history of present illness.    Review of Systems: Constitutional:  denies fever, chills, diaphoresis, appetite change and fatigue.  HEENT: denies photophobia, eye pain, redness, hearing loss, ear pain, congestion, sore throat, rhinorrhea, sneezing, neck pain, neck stiffness and tinnitus.  Respiratory: admits to SOB,   Cardiovascular: denies chest pain, palpitations and leg swelling.  Gastrointestinal: denies nausea, vomiting, abdominal pain, diarrhea, constipation, blood in stool.  Genitourinary: denies dysuria, urgency, frequency, hematuria, flank pain and difficulty urinating.  Musculoskeletal: denies  myalgias, back pain, joint swelling, arthralgias and gait problem.   Skin: denies pallor, rash and wound.  Neurological: denies dizziness, seizures, syncope, weakness, light-headedness, numbness and headaches.   Hematological: denies adenopathy, easy bruising,  personal or family bleeding history.  Psychiatric/ Behavioral: denies suicidal ideation, mood changes, confusion, nervousness, sleep disturbance and agitation.       All other systems are reviewed and negative.    PHYSICAL EXAM: VS:  BP 200/80 mmHg  Pulse 98  Ht 5\' 6"  (1.676 m)  Wt 152 lb 6.4 oz (69.128 kg)  BMI 24.61 kg/m2 , BMI Body mass index is 24.61 kg/(m^2). GEN: Well nourished, well developed, in no acute distress HEENT: normal Neck: no JVD, carotid bruits, or masses Cardiac: Irreg. Irreg. ; soft systolic  murmur, rubs, or gallops,no edema  Respiratory:  clear to auscultation bilaterally, normal work of breathing GI: soft, nontender, nondistended, + BS MS: no deformity or atrophy Skin: warm and dry, no rash Neuro:  Strength and sensation are intact Psych: normal   EKG:  EKG is not ordered today.    Recent Labs: No results found for requested labs within last 365 days.    Lipid Panel No results found for: CHOL, TRIG, HDL, CHOLHDL, VLDL, LDLCALC, LDLDIRECT    Wt Readings from Last 3 Encounters:  11/26/14 152 lb 6.4 oz (69.128 kg)  10/21/14 153 lb (69.4 kg)  12/15/12 151 lb (68.493 kg)      Other studies Reviewed: Additional studies/ records that were reviewed today include: . Review of the above records demonstrates:    ASSESSMENT AND PLAN:  1. Hypertension -  Her blood pressure was initially fairly elevated but after several minute weight came down to the 140/90 range. Her blood pressure log from home shows that her blood pressures are typically in the normal range. We will continue with her current medications.  2. Hyperlipidemia -  Followed by her general medical doctor. 3. Diabetes mellitus 4. Chronic kidney disease -  Followed by her general medical doctor. 5. Multifocal atrial tachycardia -  She still has a very irregular  heart rate and her EKG from several weeks ago showed multifocal atrial tachycardia. I've once again encouraged her to stop  smoking. We will continue with the current dose of diltiazem.  She brought her BP log with her and her home HR and BP reading are fairly normal    Current medicines are reviewed at length with the patient today.  The patient does not have concerns regarding medicines.  The following changes have been made:  no change   Disposition:   FU with me in 6 months     Signed, Presly Steinruck, Wonda Cheng, MD  11/26/2014 11:40 AM    [facility] [address], [place], [place]  [address] Phone: [phone]; Fax: [phone]

## 2014-11-26 NOTE — Patient Instructions (Signed)
Your physician recommends that you continue on your current medications as directed. Please refer to the Current Medication list given to you today.  Your physician wants you to follow-up in: 6 months with Dr. Nahser.  You will receive a reminder letter in the mail two months in advance. If you don't receive a letter, please call our office to schedule the follow-up appointment.  

## 2015-06-24 ENCOUNTER — Other Ambulatory Visit: Payer: Self-pay | Admitting: Family Medicine

## 2015-06-24 DIAGNOSIS — R0989 Other specified symptoms and signs involving the circulatory and respiratory systems: Secondary | ICD-10-CM

## 2015-06-30 ENCOUNTER — Ambulatory Visit
Admission: RE | Admit: 2015-06-30 | Discharge: 2015-06-30 | Disposition: A | Discharge status: Home / Self Care | Payer: Medicare HMO | Source: Ambulatory Visit | Attending: Family Medicine | Admitting: Family Medicine

## 2015-06-30 DIAGNOSIS — R0989 Other specified symptoms and signs involving the circulatory and respiratory systems: Secondary | ICD-10-CM

## 2015-07-06 ENCOUNTER — Other Ambulatory Visit: Payer: Self-pay | Admitting: *Deleted

## 2015-07-06 DIAGNOSIS — R0989 Other specified symptoms and signs involving the circulatory and respiratory systems: Secondary | ICD-10-CM

## 2015-07-06 DIAGNOSIS — I739 Peripheral vascular disease, unspecified: Secondary | ICD-10-CM

## 2015-07-11 ENCOUNTER — Ambulatory Visit (INDEPENDENT_AMBULATORY_CARE_PROVIDER_SITE_OTHER)
Admission: RE | Admit: 2015-07-11 | Discharge: 2015-07-11 | Disposition: A | Discharge status: Home / Self Care | Payer: Medicare HMO | Source: Ambulatory Visit | Attending: Surgery | Admitting: Surgery

## 2015-07-11 ENCOUNTER — Ambulatory Visit (HOSPITAL_COMMUNITY)
Admission: RE | Admit: 2015-07-11 | Discharge: 2015-07-11 | Disposition: A | Discharge status: Home / Self Care | Payer: Medicare HMO | Source: Ambulatory Visit | Attending: Surgery | Admitting: Surgery

## 2015-07-11 DIAGNOSIS — R0989 Other specified symptoms and signs involving the circulatory and respiratory systems: Secondary | ICD-10-CM | POA: Diagnosis not present

## 2015-07-11 DIAGNOSIS — R200 Anesthesia of skin: Secondary | ICD-10-CM | POA: Insufficient documentation

## 2015-07-11 DIAGNOSIS — I739 Peripheral vascular disease, unspecified: Secondary | ICD-10-CM | POA: Insufficient documentation

## 2015-07-25 ENCOUNTER — Encounter (HOSPITAL_COMMUNITY): Payer: Medicare HMO

## 2015-07-28 DIAGNOSIS — I739 Peripheral vascular disease, unspecified: Secondary | ICD-10-CM | POA: Diagnosis not present

## 2015-07-28 DIAGNOSIS — I10 Essential (primary) hypertension: Secondary | ICD-10-CM | POA: Diagnosis not present

## 2015-07-28 DIAGNOSIS — D179 Benign lipomatous neoplasm, unspecified: Secondary | ICD-10-CM | POA: Diagnosis not present

## 2015-07-29 ENCOUNTER — Encounter: Payer: Self-pay | Admitting: Surgery

## 2015-08-01 ENCOUNTER — Ambulatory Visit (INDEPENDENT_AMBULATORY_CARE_PROVIDER_SITE_OTHER): Payer: Medicare HMO | Admitting: Surgery

## 2015-08-01 ENCOUNTER — Encounter: Payer: Self-pay | Admitting: Surgery

## 2015-08-01 VITALS — BP 201/147 | HR 62 | Temp 98.0°F | Resp 18 | Ht 66.0 in | Wt 139.0 lb

## 2015-08-01 DIAGNOSIS — I70209 Unspecified atherosclerosis of native arteries of extremities, unspecified extremity: Secondary | ICD-10-CM | POA: Diagnosis not present

## 2015-08-01 NOTE — Progress Notes (Signed)
Patient name: Krista Mason MRN: [id number] DOB: 26-Aug-1936 Sex: female     Chief Complaint  Patient presents with  . Re-evaluation    Ref. by Dr. Drema Dallas   PVD  C/O  decreased edal pulse on Left .   Pt. states numbness of left leg ongoing.    HISTORY OF PRESENT ILLNESS: The patient is back today for follow-up of her peripheral vascular disease.  I first met her 2014.  She was having issues with numbness in her left leg for many years.  When she described her symptoms, they could occur at any time.  As could be with standing or walking or sitting.  She denied classic claudication symptoms of leg cramping with activity.  She would occasionally get cramping at night.  She has a history of a nonhealing ulcer which took approximately 1 year to heal but ultimately did.  She continues to be medically managed for type 2 diabetes.  She takes a statin for hypercholesterolemia and had discontinued it given her nausea.  The patient recently had ankle-brachial indices checked aorta slightly less than previous.  Therefore she is referred today for follow-up evaluation.  Her symptoms of leg pain has been relatively stable.  Past Medical History  Diagnosis Date  . Orthostatic hypotension   . Hypertension   . PVC (premature ventricular contraction)   . Hyperkalemia   . Sleep apnea   . Hyperlipidemia   . Rheumatic fever   . Atrial fibrillation ([place])   . Diabetes mellitus without complication ([place])   . Chronic kidney disease   . Hx of echocardiogram     Echo (1/16):  EF 60-65%, no RWMA, Gr 1 DD, mod AI, mod MR, mild LAE    Past Surgical History  Procedure Laterality Date  . Ovarian cyst removal    . Knee surgery Left 2009    Social History   Social History  . Marital Status: Widowed    Spouse Name: N/A  . Number of Children: N/A  . Years of Education: N/A   Occupational History  . Not on file.   Social History Main Topics  . Smoking status: Current Every Day Smoker -- 0.50  packs/day for 50 years  . Smokeless tobacco: Never Used     Comment: pt states " I'm thinking about it"  . Alcohol Use: No  . Drug Use: No  . Sexual Activity: Not on file   Other Topics Concern  . Not on file   Social History Narrative    Family History  Problem Relation Age of Onset  . Heart attack Father   . Heart disease Father   . Cancer Father   . Hypertension Father   . Hypertension Brother   . Stroke Brother   . Hypertension Brother   . Diabetes Son   . Hyperlipidemia Son   . Hypertension Son     Allergies as of 08/01/2015 - Review Complete 08/01/2015  Allergen Reaction Noted  . Penicillins  12/15/2012  . Ceclor [cefaclor] Itching 02/28/2012  . Epinephrine Other (See Comments) 04/15/2012  . Nsaids  12/15/2012  . Other Other (See Comments) 04/15/2012  . Percocet [oxycodone-acetaminophen] Nausea And Vomiting 02/28/2012  . Prednisone  12/15/2012    Current Outpatient Prescriptions on File Prior to Visit  Medication Sig Dispense Refill  . acetaminophen (TYLENOL) 325 MG tablet Take 325 mg by mouth every 6 (six) hours as needed. For pain    . aspirin 81 MG tablet Take 81 mg  by mouth daily.    . cholecalciferol (VITAMIN D) 1000 UNITS tablet Take 2,000 Units by mouth daily.     [name] diltiazem (TAZTIA XT) 120 MG 24 hr capsule Take 1 capsule (120 mg total) by mouth daily. 90 capsule 3  . esomeprazole (NEXIUM) 40 MG capsule Take 40 mg by mouth as needed.    . Imiquimod (ZYCLARA PUMP) 2.5 % CREA Apply topically as needed.    [name] losartan (COZAAR) 50 MG tablet Take 50 mg by mouth daily.  1  . lovastatin (MEVACOR) 20 MG tablet Take 20 mg by mouth.   0  . metFORMIN (GLUCOPHAGE) 500 MG tablet Take 500 mg by mouth 2 (two) times daily with a meal.    . Polyethyl Glycol-Propyl Glycol (SYSTANE OP) Apply 2 drops to eye 2 (two) times daily as needed. For dry eyes     No current facility-administered medications on file prior to visit.     REVIEW OF SYSTEMS: Please see history of  present illness, otherwise negative  PHYSICAL EXAMINATION:   Vital signs are  Filed Vitals:   08/01/15 1118 08/01/15 1126  BP: 218/86 201/147  Pulse: 106 62  Temp: 98 F (36.7 C)   TempSrc: Oral   Resp: 18   Height: $Remove'5\' 6"'pLLYDUX$  (1.676 m)   Weight: 139 lb (63.05 kg)   SpO2: 100%    Body mass index is 22.45 kg/(m^2). General: The patient appears their stated age. HEENT:  No gross abnormalities Pulmonary:  Non labored breathing Abdomen: Soft and non-tender Musculoskeletal: There are no major deformities. Neurologic: No focal weakness or paresthesias are detected, Skin: There are no ulcer or rashes noted. Psychiatric: The patient has normal affect. Cardiovascular: There is a regular rate and rhythm without significant murmur appreciated.  Palpable right dorsalis pedis pulse, nonpalpable left   Diagnostic Studies Most recent ABIs were 0.77 on the right and 0.69 on the left.  When compared to previous studies in 2014, she was 0.94 on the right and 0.7.  The left  Assessment: Atherosclerosis Plan: Although the patient has had some progression of her atherosclerotic disease, I still feel that she remained asymptomatic.  I discussed with her that as [place] as she is asymptomatic no intervention would be recommended.  I think her symptoms are still related to neuropathic issues.  She may benefit from further evaluation of her lower back and possibly physical therapy.  I'll leave that to the discretion of Dr. Drema Dallas.  I again stressed the importance of daily foot inspection and foot care given her diabetes and peripheral neuropathy.  I have scheduled her to follow-up again in 1 year with a repeat ankle brachial index and  V. Leia Alf, M.D. [facility] Office: [phone] Pager:  [phone]

## 2015-08-01 NOTE — Progress Notes (Signed)
Filed Vitals:   08/01/15 1118 08/01/15 1126  BP: 218/86 201/147  Pulse: 106 62  Temp: 98 F (36.7 C)   TempSrc: Oral   Resp: 18   Height: 5\' 6"  (1.676 m)   Weight: 139 lb (63.05 kg)   SpO2: 100%

## 2015-08-01 NOTE — Addendum Note (Signed)
Addended by: Dorthula Rue L on: 08/01/2015 03:11 PM   Modules accepted: Orders

## 2015-09-22 DIAGNOSIS — N189 Chronic kidney disease, unspecified: Secondary | ICD-10-CM | POA: Diagnosis not present

## 2015-09-22 DIAGNOSIS — Z23 Encounter for immunization: Secondary | ICD-10-CM | POA: Diagnosis not present

## 2015-09-22 DIAGNOSIS — I10 Essential (primary) hypertension: Secondary | ICD-10-CM | POA: Diagnosis not present

## 2015-09-22 DIAGNOSIS — Z1389 Encounter for screening for other disorder: Secondary | ICD-10-CM | POA: Diagnosis not present

## 2015-09-22 DIAGNOSIS — E785 Hyperlipidemia, unspecified: Secondary | ICD-10-CM | POA: Diagnosis not present

## 2015-09-22 DIAGNOSIS — I739 Peripheral vascular disease, unspecified: Secondary | ICD-10-CM | POA: Diagnosis not present

## 2015-09-22 DIAGNOSIS — E119 Type 2 diabetes mellitus without complications: Secondary | ICD-10-CM | POA: Diagnosis not present

## 2015-09-22 DIAGNOSIS — E559 Vitamin D deficiency, unspecified: Secondary | ICD-10-CM | POA: Diagnosis not present

## 2015-09-22 DIAGNOSIS — Z0000 Encounter for general adult medical examination without abnormal findings: Secondary | ICD-10-CM | POA: Diagnosis not present

## 2015-09-30 ENCOUNTER — Other Ambulatory Visit: Payer: Self-pay | Admitting: Family Medicine

## 2015-09-30 DIAGNOSIS — Z1231 Encounter for screening mammogram for malignant neoplasm of breast: Secondary | ICD-10-CM

## 2015-09-30 DIAGNOSIS — M810 Age-related osteoporosis without current pathological fracture: Secondary | ICD-10-CM

## 2015-11-03 ENCOUNTER — Ambulatory Visit
Admission: RE | Admit: 2015-11-03 | Discharge: 2015-11-03 | Disposition: A | Discharge status: Home / Self Care | Payer: Medicare HMO | Source: Ambulatory Visit | Attending: Family Medicine | Admitting: Family Medicine

## 2015-11-03 DIAGNOSIS — Z1231 Encounter for screening mammogram for malignant neoplasm of breast: Secondary | ICD-10-CM

## 2015-11-03 DIAGNOSIS — M8589 Other specified disorders of bone density and structure, multiple sites: Secondary | ICD-10-CM | POA: Diagnosis not present

## 2015-11-03 DIAGNOSIS — M810 Age-related osteoporosis without current pathological fracture: Secondary | ICD-10-CM

## 2015-11-16 DIAGNOSIS — L820 Inflamed seborrheic keratosis: Secondary | ICD-10-CM | POA: Diagnosis not present

## 2015-11-16 DIAGNOSIS — D1801 Hemangioma of skin and subcutaneous tissue: Secondary | ICD-10-CM | POA: Diagnosis not present

## 2015-11-16 DIAGNOSIS — L821 Other seborrheic keratosis: Secondary | ICD-10-CM | POA: Diagnosis not present

## 2015-11-16 DIAGNOSIS — D485 Neoplasm of uncertain behavior of skin: Secondary | ICD-10-CM | POA: Diagnosis not present

## 2015-11-16 DIAGNOSIS — Z85828 Personal history of other malignant neoplasm of skin: Secondary | ICD-10-CM | POA: Diagnosis not present

## 2015-11-16 DIAGNOSIS — L570 Actinic keratosis: Secondary | ICD-10-CM | POA: Diagnosis not present

## 2015-11-29 DIAGNOSIS — M8580 Other specified disorders of bone density and structure, unspecified site: Secondary | ICD-10-CM | POA: Diagnosis not present

## 2015-11-29 DIAGNOSIS — N189 Chronic kidney disease, unspecified: Secondary | ICD-10-CM | POA: Diagnosis not present

## 2015-12-12 DIAGNOSIS — R69 Illness, unspecified: Secondary | ICD-10-CM | POA: Diagnosis not present

## 2015-12-27 ENCOUNTER — Other Ambulatory Visit: Payer: Self-pay | Admitting: Cardiovascular Disease

## 2016-01-11 DIAGNOSIS — L820 Inflamed seborrheic keratosis: Secondary | ICD-10-CM | POA: Diagnosis not present

## 2016-01-11 DIAGNOSIS — L570 Actinic keratosis: Secondary | ICD-10-CM | POA: Diagnosis not present

## 2016-03-07 DIAGNOSIS — R69 Illness, unspecified: Secondary | ICD-10-CM | POA: Diagnosis not present

## 2016-03-30 ENCOUNTER — Other Ambulatory Visit: Payer: Self-pay | Admitting: Cardiovascular Disease

## 2016-04-28 ENCOUNTER — Other Ambulatory Visit: Payer: Self-pay | Admitting: Cardiovascular Disease

## 2016-05-29 ENCOUNTER — Other Ambulatory Visit: Payer: Self-pay | Admitting: Cardiovascular Disease

## 2016-06-12 DIAGNOSIS — R69 Illness, unspecified: Secondary | ICD-10-CM | POA: Diagnosis not present

## 2016-06-12 DIAGNOSIS — I10 Essential (primary) hypertension: Secondary | ICD-10-CM | POA: Diagnosis not present

## 2016-06-12 DIAGNOSIS — E119 Type 2 diabetes mellitus without complications: Secondary | ICD-10-CM | POA: Diagnosis not present

## 2016-06-12 DIAGNOSIS — K449 Diaphragmatic hernia without obstruction or gangrene: Secondary | ICD-10-CM | POA: Diagnosis not present

## 2016-06-12 DIAGNOSIS — R634 Abnormal weight loss: Secondary | ICD-10-CM | POA: Diagnosis not present

## 2016-06-12 DIAGNOSIS — F17200 Nicotine dependence, unspecified, uncomplicated: Secondary | ICD-10-CM | POA: Diagnosis not present

## 2016-06-12 DIAGNOSIS — R030 Elevated blood-pressure reading, without diagnosis of hypertension: Secondary | ICD-10-CM | POA: Diagnosis not present

## 2016-06-12 DIAGNOSIS — R000 Tachycardia, unspecified: Secondary | ICD-10-CM | POA: Diagnosis not present

## 2016-06-12 DIAGNOSIS — E559 Vitamin D deficiency, unspecified: Secondary | ICD-10-CM | POA: Diagnosis not present

## 2016-06-12 DIAGNOSIS — E785 Hyperlipidemia, unspecified: Secondary | ICD-10-CM | POA: Diagnosis not present

## 2016-06-18 ENCOUNTER — Ambulatory Visit (INDEPENDENT_AMBULATORY_CARE_PROVIDER_SITE_OTHER): Payer: Medicare HMO | Admitting: Cardiovascular Disease

## 2016-06-18 ENCOUNTER — Encounter: Payer: Self-pay | Admitting: Cardiovascular Disease

## 2016-06-18 VITALS — BP 160/90 | HR 98 | Ht 66.0 in | Wt 132.8 lb

## 2016-06-18 DIAGNOSIS — I10 Essential (primary) hypertension: Secondary | ICD-10-CM | POA: Insufficient documentation

## 2016-06-18 DIAGNOSIS — I471 Supraventricular tachycardia: Secondary | ICD-10-CM | POA: Diagnosis not present

## 2016-06-18 MED ORDER — DILTIAZEM HCL ER BEADS 120 MG PO CP24: 120.0000 mg | ORAL_CAPSULE | Freq: Every day | ORAL | 3 refills | Status: DC

## 2016-06-18 MED ORDER — METOPROLOL TARTRATE 25 MG PO TABS: 25.0000 mg | ORAL_TABLET | Freq: Two times a day (BID) | ORAL | 11 refills | Status: DC

## 2016-06-18 NOTE — Addendum Note (Signed)
Addended by: Emmaline Life on: 06/18/2016 05:17 PM   Modules accepted: Orders

## 2016-06-18 NOTE — Patient Instructions (Signed)
Medication Instructions:  START: Metoprolol (Lopressor) 25 mg twice a day.  Labwork: None ordered  Testing/Procedures: None ordered  Follow-Up: Your physician wants you to follow-up in: 6 months with Dr. Acie Fredrickson. You will receive a reminder letter in the mail two months in advance. If you don't receive a letter, please call our office to schedule the follow-up appointment.    If you need a refill on your cardiac medications before your next appointment, please call your pharmacy.

## 2016-06-18 NOTE — Progress Notes (Signed)
Cardiology Office Note   Date:  06/18/2016   ID:  Krista Mason, DOB 1936-07-02, MRN [id number]  PCP:  Gerrit Heck, MD  Cardiologist:   Mertie Moores, MD   Chief Complaint  Patient presents with  . Follow-up    P roblem List 1. Hypertension 2. Hyperlipidemia 3. Diabetes mellitus 4. Chronic kidney disease 5. Multifocal atrial tachycardia   History of Present Illness: Krista Mason is a 80 y.o. female     retired Therapist, sports with a hx of HTN, PVCs, HL, DM2, CKD, histoplasmosis, tobacco abuse. She was evaluated by Dr. Trula Slade in 2014 for PAD. ABIs: R 0.94 and L 0.73. Medical Rx was recommended at that time. I last saw me  in 2010.   she was recent seen by Nicki Reaper we are for episodes of tachycardia. Her EKG showed multifocal atrial tachycardia. This was probably related to her COPD.  The MAT has improved.  She is still taking the Diltiazem .   No CP or dyspnea.  Has had some left  knee surgery   Aug. 28, 2017,  Pt is seen for her 1 year ov. Saw Dr. Drema Dallas last week.  Was found to have tachycardia - likely ws MAT .   No CP or dyspnea Has lost some weight .  20 labs in the past year  Is scheduled to see Dr. Cristina Gong.    Wt Readings from Last 3 Encounters:  06/18/16 132 lb 12.8 oz (60.2 kg)  08/01/15 139 lb (63 kg)  11/26/14 152 lb 6.4 oz (69.1 kg)       Past Medical History:  Diagnosis Date  . Atrial fibrillation ([place])   . Chronic kidney disease   . Diabetes mellitus without complication ([place])   . Hx of echocardiogram    Echo (1/16):  EF 60-65%, no RWMA, Gr 1 DD, mod AI, mod MR, mild LAE  . Hyperkalemia   . Hyperlipidemia   . Hypertension   . Orthostatic hypotension   . PVC (premature ventricular contraction)   . Rheumatic fever   . Sleep apnea     Past Surgical History:  Procedure Laterality Date  . KNEE SURGERY Left 2009  . OVARIAN CYST REMOVAL       Current Outpatient Prescriptions  Medication Sig Dispense Refill  . acetaminophen  (TYLENOL) 325 MG tablet Take 325 mg by mouth every 6 (six) hours as needed. For pain    . aspirin 81 MG tablet Take 81 mg by mouth daily.    . cholecalciferol (VITAMIN D) 1000 UNITS tablet Take 2,000 Units by mouth daily.     [name] diltiazem (TIAZAC) 120 MG 24 hr capsule Take 1 capsule (120 mg total) by mouth daily. Patient is overdue for an appt. Please call and schedule for further refills 15 capsule 0  . esomeprazole (NEXIUM) 40 MG capsule Take 40 mg by mouth as needed.    . Imiquimod (ZYCLARA PUMP) 2.5 % CREA Apply topically as needed.    [name] losartan (COZAAR) 50 MG tablet Take 50 mg by mouth daily.  1  . lovastatin (MEVACOR) 20 MG tablet Take 20 mg by mouth.   0  . metFORMIN (GLUCOPHAGE) 500 MG tablet Take 500 mg by mouth 2 (two) times daily with a meal.    . Polyethyl Glycol-Propyl Glycol (SYSTANE OP) Apply 2 drops to eye 2 (two) times daily as needed. For dry eyes     No current facility-administered medications for this visit.     Allergies:  Penicillins; Ceclor [cefaclor]; Epinephrine; Nsaids; Other; Percocet [oxycodone-acetaminophen]; and Prednisone    Social History:  The patient  reports that she has been smoking.  She has a 25.00 pack-year smoking history. She has never used smokeless tobacco. She reports that she does not drink alcohol or use drugs.   Family History:  The patient's family history includes Cancer in her father; Diabetes in her son; Heart attack in her father; Heart disease in her father; Hyperlipidemia in her son; Hypertension in her brother, brother, father, and son; Stroke in her brother.    ROS:  Please see the history of present illness.    Review of Systems: Constitutional:  denies fever, chills, diaphoresis, appetite change and fatigue.  HEENT: denies photophobia, eye pain, redness, hearing loss, ear pain, congestion, sore throat, rhinorrhea, sneezing, neck pain, neck stiffness and tinnitus.  Respiratory: admits to SOB,   Cardiovascular: denies chest pain,  palpitations and leg swelling.  Gastrointestinal: denies nausea, vomiting, abdominal pain, diarrhea, constipation, blood in stool.  Genitourinary: denies dysuria, urgency, frequency, hematuria, flank pain and difficulty urinating.  Musculoskeletal: denies  myalgias, back pain, joint swelling, arthralgias and gait problem.   Skin: denies pallor, rash and wound.  Neurological: denies dizziness, seizures, syncope, weakness, light-headedness, numbness and headaches.   Hematological: denies adenopathy, easy bruising, personal or family bleeding history.  Psychiatric/ Behavioral: denies suicidal ideation, mood changes, confusion, nervousness, sleep disturbance and agitation.       All other systems are reviewed and negative.    PHYSICAL EXAM: VS:  BP (!) 160/90 (BP Location: Left Arm, Patient Position: Sitting, Cuff Size: Normal)   Pulse 98   Ht 5\' 6"  (1.676 m)   Wt 132 lb 12.8 oz (60.2 kg)   BMI 21.43 kg/m  , BMI Body mass index is 21.43 kg/m. GEN: Well nourished, well developed, in no acute distress  HEENT: normal  Neck: no JVD, carotid bruits, or masses Cardiac: Irreg. Irreg. ; soft systolic  murmur, rubs, or gallops,no edema  Respiratory:  clear to auscultation bilaterally, normal work of breathing GI: soft, nontender, nondistended, + BS MS: no deformity or atrophy  Skin: warm and dry, no rash Neuro:  Strength and sensation are intact Psych: normal   EKG:  EKG is ordered today.  NSR at 98.  1st degree AV block . PACs voltage criteria for LVH.     Recent Labs: No results found for requested labs within last 8760 hours.    Lipid Panel No results found for: CHOL, TRIG, HDL, CHOLHDL, VLDL, LDLCALC, LDLDIRECT    Wt Readings from Last 3 Encounters:  06/18/16 132 lb 12.8 oz (60.2 kg)  08/01/15 139 lb (63 kg)  11/26/14 152 lb 6.4 oz (69.1 kg)      Other studies Reviewed: Additional studies/ records that were reviewed today include: . Review of the above records  demonstrates:    ASSESSMENT AND PLAN:  1. Hypertension -   Blood pressure is a little elevated. She is also tachycardic. She does not take beta agonist for her breathing. We'll try her on metoprolol 25 mg twice a day. She'll call us in several weeks or send [place] a message via mychart to let us know how she's doing.  2. Hyperlipidemia -  Followed by her general medical doctor. 3. Diabetes mellitus 4. Chronic kidney disease -  Followed by her general medical doctor. 5. Multifocal atrial tachycardia -   Still has episodes of tachycardia. She's on a very low-dose dose diltiazem. Most of her issues when  she gets anxious. She might benefit from low-dose metoprolol. We will add metoprolol 25 g twice a day. She developed shortness of breath with this, we'll stop the metoprolol and put her on a higher dose of diltiazem.      Current medicines are reviewed at length with the patient today.  The patient does not have concerns regarding medicines.  The following changes have been made:  no change   Disposition:   FU with me in 6 months     Signed, Mertie Moores, MD  06/18/2016 4:54 PM    [facility] [place], [place], [place]  [address] Phone: [phone]; Fax: [phone]

## 2016-06-19 ENCOUNTER — Telehealth: Payer: Self-pay | Admitting: Cardiovascular Disease

## 2016-06-19 NOTE — Telephone Encounter (Signed)
Spoke with patient and advised her that all upcoming testing was ordered by Dr. Stephens Shire office.  I gave her his office phone number and advised her to call with questions.  She verbalized understanding and agreement with plan.

## 2016-06-19 NOTE — Telephone Encounter (Signed)
Krista Mason is calling because she wanted to ask about some test that were schedule and it was not addressed with her . Please call   Thanks

## 2016-06-20 ENCOUNTER — Other Ambulatory Visit: Payer: Self-pay | Admitting: Acute Care

## 2016-06-20 DIAGNOSIS — F17210 Nicotine dependence, cigarettes, uncomplicated: Secondary | ICD-10-CM

## 2016-06-26 ENCOUNTER — Telehealth: Payer: Self-pay | Admitting: Cardiovascular Disease

## 2016-06-26 NOTE — Telephone Encounter (Signed)
New message       Pt c/o medication issue:  1. Name of Medication: lopressor  2. How are you currently taking this medication (dosage and times per day)? 25mg  bid 3. Are you having a reaction (difficulty breathing--STAT)? no 4. What is your medication issue?  Since starting medication, pt said she is extremely tired. Yesterday, she went outside to get the paper and could not hardly make it back inside.  Please advise

## 2016-06-26 NOTE — Telephone Encounter (Signed)
Please have her decrease the metoprolol to 12.5 mg BID and see if that helps

## 2016-06-26 NOTE — Telephone Encounter (Signed)
Patient stated she is feeling weak and fatigue since she started Lopressor. Patient stated she held last nights dose and felt a little better. Patient stated she has not been taking her BP. Informed patient to start recording her BP. Informed patient if her BP is too low and she is having weakness and fatigue, that she should hold her Lopressor and give our office a call. Informed patient that her message would be sent to Dr. Acie Fredrickson and his nurse. Patient verbalized understanding.

## 2016-06-27 MED ORDER — METOPROLOL TARTRATE 25 MG PO TABS: 12.5000 mg | ORAL_TABLET | Freq: Two times a day (BID) | ORAL | 11 refills | Status: DC

## 2016-06-27 NOTE — Telephone Encounter (Signed)
Spoke with patient who states she has not been feeling well since starting metoprolol.  Reports vital signs as follows: 163/72 mmHg, 60 bpm yesterday afternoon 146/62 mmHg, 55 bpm @ 2100 yesterday 158/94 mmHg, 97 bpm @ 0900 today 155/71 mmHg, 73 bpm currently She complains of feeling dizzy and not able to ambulate well; states people have asked her why she is so pale.  I advised her to decrease lopressor to 12.5 mg twice daily and continue to monitor vital signs. I advised that her heart rate of 55-60 bpm may have been too low for her since she is used to a higher heart rate.  I advised her to call back to if her symptoms do not improve after decreasing the dose of lopressor.  She verbalized understanding and agreement and thanked me for the call.

## 2016-06-29 DIAGNOSIS — I10 Essential (primary) hypertension: Secondary | ICD-10-CM | POA: Diagnosis not present

## 2016-06-29 DIAGNOSIS — R1110 Vomiting, unspecified: Secondary | ICD-10-CM | POA: Diagnosis not present

## 2016-06-29 DIAGNOSIS — D649 Anemia, unspecified: Secondary | ICD-10-CM | POA: Diagnosis not present

## 2016-07-11 ENCOUNTER — Ambulatory Visit (INDEPENDENT_AMBULATORY_CARE_PROVIDER_SITE_OTHER)
Admission: RE | Admit: 2016-07-11 | Discharge: 2016-07-11 | Disposition: A | Discharge status: Home / Self Care | Payer: Medicare HMO | Source: Ambulatory Visit | Attending: Acute Care | Admitting: Acute Care

## 2016-07-11 ENCOUNTER — Ambulatory Visit (INDEPENDENT_AMBULATORY_CARE_PROVIDER_SITE_OTHER): Payer: Medicare HMO | Admitting: Acute Care

## 2016-07-11 ENCOUNTER — Encounter: Payer: Self-pay | Admitting: Acute Care

## 2016-07-11 DIAGNOSIS — R69 Illness, unspecified: Secondary | ICD-10-CM | POA: Diagnosis not present

## 2016-07-11 DIAGNOSIS — Z87891 Personal history of nicotine dependence: Secondary | ICD-10-CM | POA: Diagnosis not present

## 2016-07-11 DIAGNOSIS — Z122 Encounter for screening for malignant neoplasm of respiratory organs: Secondary | ICD-10-CM

## 2016-07-11 DIAGNOSIS — F17210 Nicotine dependence, cigarettes, uncomplicated: Secondary | ICD-10-CM | POA: Diagnosis not present

## 2016-07-11 NOTE — Progress Notes (Signed)
Shared Decision Making Visit Lung Cancer Screening Program [phone])   Eligibility:  Age 80 y.o.  Pack Years Smoking History Calculation 32 pack year smoking history (# packs/per year x # years smoked)  Recent History of coughing up blood  no  Unexplained weight loss? no ( >Than 15 pounds within the last 6 months )  Prior History Lung / other cancer no (Diagnosis within the last 5 years already requiring surveillance chest CT Scans).  Smoking Status Current Smoker  Former Smokers: Years since quit: NA  Quit Date: NA  Visit Components:  Discussion included one or more decision making aids. yes  Discussion included risk/benefits of screening. yes  Discussion included potential follow up diagnostic testing for abnormal scans. yes  Discussion included meaning and risk of over diagnosis. yes  Discussion included meaning and risk of False Positives. yes  Discussion included meaning of total radiation exposure. yes  Counseling Included:  Importance of adherence to annual lung cancer LDCT screening. yes  Impact of comorbidities on ability to participate in the program. yes  Ability and willingness to under diagnostic treatment. yes  Smoking Cessation Counseling:  Current Smokers:   Discussed importance of smoking cessation. yes  Information about tobacco cessation classes and interventions provided to patient. yes  Patient provided with "ticket" for LDCT Scan. yes  Symptomatic Patient. no  Counseling  Diagnosis Code: Tobacco Use Z72.0  Asymptomatic Patient yes  Counseling (Intermediate counseling: > three minutes counseling) K5625  Former Smokers:   Discussed the importance of maintaining cigarette abstinence. yes  Diagnosis Code: Personal History of Nicotine Dependence. W38.937  Information about tobacco cessation classes and interventions provided to patient. Yes  Patient provided with "ticket" for LDCT Scan. yes  Written Order for Lung Cancer  Screening with LDCT placed in Epic. Yes (CT Chest Lung Cancer Screening Low Dose W/O CM) [id number] Z12.2-Screening of respiratory organs Z87.891-Personal history of nicotine dependence  I have spent 20 minutes of face to face time with Krista Mason discussing the risks and benefits of lung cancer screening. We viewed a power point together that explained in detail the above noted topics. We paused at intervals to allow for questions to be asked and answered to ensure understanding.We discussed that the single most powerful action that she can take to decrease her risk of developing lung cancer is to quit smoking. We discussed whether or not she is ready to commit to setting a quit date. She is working on quitting smoking, but has not set a quit date. We discussed options for tools to aid in quitting smoking including nicotine replacement therapy, non-nicotine medications, support groups, Quit Smart classes, and behavior modification. We discussed that often times setting smaller, more achievable goals, such as eliminating 1 cigarette a day for a week and then 2 cigarettes a day for a week can be helpful in slowly decreasing the number of cigarettes smoked. This allows for a sense of accomplishment as well as providing a clinical benefit. I gave her the " Be Stronger Than Your Excuses" card with contact information for community resources, classes, free nicotine replacement therapy, and access to mobile apps, text messaging, and on-line smoking cessation help. I have also given her my card and contact information in the event she needs to contact me. We discussed the time and location of the scan, and that either June Leap, CMA, or I will call with the results within 24-48 hours of receiving them. I have provided her with a copy of the power point  we viewed  as a resource in the event they need reinforcement of the concepts we discussed today in the office. The patient verbalized understanding of all of  the above  and had no further questions upon leaving the office. They have my contact information in the event they have any further questions.   Krista Spatz, NP 07/11/2016

## 2016-07-12 ENCOUNTER — Telehealth: Payer: Self-pay | Admitting: Acute Care

## 2016-07-12 DIAGNOSIS — F17210 Nicotine dependence, cigarettes, uncomplicated: Secondary | ICD-10-CM

## 2016-07-12 NOTE — Telephone Encounter (Signed)
I attempted to call Krista Mason with the results of her low-dose CT. There was no answer, or an option to leave a message. I will attempt to call results at a later date.

## 2016-07-16 ENCOUNTER — Telehealth: Payer: Self-pay | Admitting: Acute Care

## 2016-07-16 NOTE — Telephone Encounter (Signed)
I have called Ms. Berk with her CT scan results. I have told her that the scan was read as Lung RADS 2: nodules that are benign in appearance and behavior with a very low likelihood of becoming a clinically active cancer due to size or lack of growth. Recommendation per radiology is for a repeat LDCT in 12 months. I explained that her scan also indicated old granulomatous disease, emphysema, and coronary artery and aortic atherosclerosis. She is currently on a statin and followed by cardiology.The scan also indicated a L1 superior endplate compression fracture age indeterminate. I have shared these results with the patient who verbalized understanding of the results.

## 2016-07-16 NOTE — Telephone Encounter (Signed)
Pt is calling for results of her CT scan.  Will forward to SG for these.  thanks

## 2016-07-18 ENCOUNTER — Other Ambulatory Visit: Payer: Self-pay | Admitting: Physician Assistant

## 2016-07-18 DIAGNOSIS — R1013 Epigastric pain: Secondary | ICD-10-CM | POA: Diagnosis not present

## 2016-07-18 DIAGNOSIS — R634 Abnormal weight loss: Secondary | ICD-10-CM | POA: Diagnosis not present

## 2016-07-18 DIAGNOSIS — R1111 Vomiting without nausea: Secondary | ICD-10-CM | POA: Diagnosis not present

## 2016-07-18 DIAGNOSIS — R1310 Dysphagia, unspecified: Secondary | ICD-10-CM

## 2016-07-18 DIAGNOSIS — D509 Iron deficiency anemia, unspecified: Secondary | ICD-10-CM | POA: Diagnosis not present

## 2016-07-19 DIAGNOSIS — M546 Pain in thoracic spine: Secondary | ICD-10-CM | POA: Diagnosis not present

## 2016-07-19 DIAGNOSIS — S32010A Wedge compression fracture of first lumbar vertebra, initial encounter for closed fracture: Secondary | ICD-10-CM | POA: Diagnosis not present

## 2016-07-19 DIAGNOSIS — R69 Illness, unspecified: Secondary | ICD-10-CM | POA: Diagnosis not present

## 2016-07-19 DIAGNOSIS — M810 Age-related osteoporosis without current pathological fracture: Secondary | ICD-10-CM | POA: Diagnosis not present

## 2016-07-24 DIAGNOSIS — H18411 Arcus senilis, right eye: Secondary | ICD-10-CM | POA: Diagnosis not present

## 2016-07-24 DIAGNOSIS — H02839 Dermatochalasis of unspecified eye, unspecified eyelid: Secondary | ICD-10-CM | POA: Diagnosis not present

## 2016-07-24 DIAGNOSIS — H18412 Arcus senilis, left eye: Secondary | ICD-10-CM | POA: Diagnosis not present

## 2016-07-24 DIAGNOSIS — Z961 Presence of intraocular lens: Secondary | ICD-10-CM | POA: Diagnosis not present

## 2016-07-25 ENCOUNTER — Ambulatory Visit
Admission: RE | Admit: 2016-07-25 | Discharge: 2016-07-25 | Disposition: A | Discharge status: Home / Self Care | Payer: Medicare HMO | Source: Ambulatory Visit | Attending: Physician Assistant | Admitting: Physician Assistant

## 2016-07-25 DIAGNOSIS — R1310 Dysphagia, unspecified: Secondary | ICD-10-CM

## 2016-07-31 DIAGNOSIS — R030 Elevated blood-pressure reading, without diagnosis of hypertension: Secondary | ICD-10-CM | POA: Diagnosis not present

## 2016-07-31 DIAGNOSIS — S32010D Wedge compression fracture of first lumbar vertebra, subsequent encounter for fracture with routine healing: Secondary | ICD-10-CM | POA: Diagnosis not present

## 2016-07-31 DIAGNOSIS — D509 Iron deficiency anemia, unspecified: Secondary | ICD-10-CM | POA: Diagnosis not present

## 2016-07-31 DIAGNOSIS — I10 Essential (primary) hypertension: Secondary | ICD-10-CM | POA: Diagnosis not present

## 2016-07-31 DIAGNOSIS — K222 Esophageal obstruction: Secondary | ICD-10-CM | POA: Diagnosis not present

## 2016-07-31 DIAGNOSIS — M810 Age-related osteoporosis without current pathological fracture: Secondary | ICD-10-CM | POA: Diagnosis not present

## 2016-08-01 DIAGNOSIS — K222 Esophageal obstruction: Secondary | ICD-10-CM | POA: Diagnosis not present

## 2016-08-01 DIAGNOSIS — R933 Abnormal findings on diagnostic imaging of other parts of digestive tract: Secondary | ICD-10-CM | POA: Diagnosis not present

## 2016-08-01 DIAGNOSIS — R1314 Dysphagia, pharyngoesophageal phase: Secondary | ICD-10-CM | POA: Diagnosis not present

## 2016-08-10 DIAGNOSIS — M810 Age-related osteoporosis without current pathological fracture: Secondary | ICD-10-CM | POA: Diagnosis not present

## 2016-08-10 DIAGNOSIS — M47816 Spondylosis without myelopathy or radiculopathy, lumbar region: Secondary | ICD-10-CM | POA: Diagnosis not present

## 2016-08-16 DIAGNOSIS — M545 Low back pain: Secondary | ICD-10-CM | POA: Diagnosis not present

## 2016-08-17 DIAGNOSIS — K222 Esophageal obstruction: Secondary | ICD-10-CM | POA: Diagnosis not present

## 2016-08-17 DIAGNOSIS — R1310 Dysphagia, unspecified: Secondary | ICD-10-CM | POA: Diagnosis not present

## 2016-08-27 ENCOUNTER — Encounter (HOSPITAL_COMMUNITY): Payer: Medicare HMO

## 2016-08-27 ENCOUNTER — Ambulatory Visit: Payer: Medicare HMO | Admitting: Family

## 2016-08-31 DIAGNOSIS — M5416 Radiculopathy, lumbar region: Secondary | ICD-10-CM | POA: Diagnosis not present

## 2016-08-31 DIAGNOSIS — M545 Low back pain: Secondary | ICD-10-CM | POA: Diagnosis not present

## 2016-09-11 DIAGNOSIS — K208 Other esophagitis: Secondary | ICD-10-CM | POA: Diagnosis not present

## 2016-09-11 DIAGNOSIS — R1310 Dysphagia, unspecified: Secondary | ICD-10-CM | POA: Diagnosis not present

## 2016-09-11 DIAGNOSIS — K222 Esophageal obstruction: Secondary | ICD-10-CM | POA: Diagnosis not present

## 2016-09-15 ENCOUNTER — Other Ambulatory Visit: Payer: Self-pay | Admitting: Cardiovascular Disease

## 2016-09-18 NOTE — Telephone Encounter (Signed)
Medication Detail    Disp Refills Start End   diltiazem (TIAZAC) 120 MG 24 hr capsule 90 capsule 3 06/18/2016    Sig - Route: Take 1 capsule (120 mg total) by mouth daily. - Oral   E-Prescribing Status: Receipt confirmed by pharmacy (06/18/2016 5:17 PM EDT)   Pharmacy   [id number] - [place],  - [address] AT [address]

## 2016-09-19 DIAGNOSIS — M545 Low back pain: Secondary | ICD-10-CM | POA: Diagnosis not present

## 2016-09-19 DIAGNOSIS — M2170 Unequal limb length (acquired), unspecified site: Secondary | ICD-10-CM | POA: Diagnosis not present

## 2016-09-19 DIAGNOSIS — M810 Age-related osteoporosis without current pathological fracture: Secondary | ICD-10-CM | POA: Diagnosis not present

## 2016-09-27 ENCOUNTER — Ambulatory Visit: Payer: Medicare HMO | Attending: Sports Medicine | Admitting: Physical Therapy

## 2016-09-27 DIAGNOSIS — R293 Abnormal posture: Secondary | ICD-10-CM | POA: Diagnosis present

## 2016-09-27 DIAGNOSIS — M6281 Muscle weakness (generalized): Secondary | ICD-10-CM | POA: Insufficient documentation

## 2016-09-27 DIAGNOSIS — M5442 Lumbago with sciatica, left side: Secondary | ICD-10-CM | POA: Diagnosis not present

## 2016-09-27 DIAGNOSIS — G8929 Other chronic pain: Secondary | ICD-10-CM | POA: Diagnosis present

## 2016-09-27 NOTE — Patient Instructions (Signed)
Knee to Chest (Flexion)    Pull knee toward chest. Feel stretch in lower back or buttock area. Breathing deeply, Hold _20-30___ seconds. Repeat with other knee. Repeat _2-3___ times. Do _2-3___ sessions per day.  http://gt2.exer.us/226   Copyright  VHI. All rights reserved.   Piriformis Stretch    Lying on back, pull right knee toward opposite shoulder. Hold __20-30__ seconds.  Repeat with other leg. Repeat _2-3___ times. Do _2-3___ sessions per day.  http://gt2.exer.us/258   Copyright  VHI. All rights reserved.   Hamstring Step 2    Left foot relaxed, knee straight, other leg bent, foot flat. Raise straight leg further upward to maximal range. Hold __20-30_ seconds. Relax leg completely down.  Repeat with other leg. Repeat _2-3__ times.  Perform 2-3 sessions per day.   Copyright  VHI. All rights reserved.

## 2016-09-27 NOTE — Therapy (Signed)
[facility] [address]  [place], [place], [address] Phone: [phone]   Fax:  [phone]  Physical Therapy Evaluation  Patient Details  Name: Krista Mason MRN: [id number] Date of Birth: Jan 01, 1936 Referring Provider: Dr. Wandra Feinstein  Encounter Date: 09/27/2016      PT End of Session - 09/27/16 1153    Visit Number 1   Number of Visits 12   Date for PT Re-Evaluation 11/08/16   PT Start Time 1100   PT Stop Time 1140   PT Time Calculation (min) 40 min   Activity Tolerance Patient tolerated treatment well   Behavior During Therapy [facility] for tasks assessed/performed      Past Medical History:  Diagnosis Date  . Atrial fibrillation ([place])   . Chronic kidney disease   . Diabetes mellitus without complication ([place])   . Hx of echocardiogram    Echo (1/16):  EF 60-65%, no RWMA, Gr 1 DD, mod AI, mod MR, mild LAE  . Hyperkalemia   . Hyperlipidemia   . Hypertension   . Orthostatic hypotension   . PVC (premature ventricular contraction)   . Rheumatic fever   . Sleep apnea     Past Surgical History:  Procedure Laterality Date  . KNEE SURGERY Left 2009  . OVARIAN CYST REMOVAL      There were no vitals filed for this visit.       Subjective Assessment - 09/27/16 1057    Subjective Pt is a 80 y/o female who presents to OPPT with 6 month hx of LBP.  Pt reports lung CT in Aug revealed L1 compression fx.  Pt reports initial tx limited activity and back brace. Pt unable to recall mechanism of injury. Reports ESI helped on L side some.   Pertinent History CKD, afib, DM, HLD, HTN, rhematic fever   Limitations House hold activities;Lifting;Sitting;Standing;Walking   How Fre[place] can you sit comfortably? < 5 min   How [place] can you stand comfortably? ~ 10 min   How Retz[place]off can you walk comfortably? 15 min while grocery shopping with UE support on cart; without < 5 min   Diagnostic tests MRI: slight herniated disc L3-5;  mod to severe arthritis, and compression fx only 25% healed   Patient Stated Goals improve pain, return to normal daily activities   Currently in Pain? Yes   Pain Score 4   up to 8-9/10; at best 1-2/10   Pain Location Back   Pain Orientation Lower;Left   Pain Descriptors / Indicators Dull;Aching;Throbbing;Stabbing;Pressure;Nagging   Pain Type Chronic pain   Pain Radiating Towards numbness into bil hips and LLE   Pain Onset More than a month ago   Pain Frequency Constant   Aggravating Factors  sitting, standing, walking, bending, lifting   Pain Relieving Factors sitting in lounge chair, tylenol            [facility] PT Assessment - 09/27/16 1107      Assessment   Medical Diagnosis LBP   Referring Provider Dr. Wandra Feinstein   Onset Date/Surgical Date --  June 2017   Next MD Visit 10/11/16   Prior Therapy unrelated to LBP     Precautions   Precautions None     Restrictions   Weight Bearing Restrictions No     Balance Screen   Has the patient fallen in the past 6 months No   Has the patient had a decrease in activity level because of a fear of  falling?  Yes   Is the patient reluctant to leave their home because of a fear of falling?  No     Home Environment   Living Environment Private residence   Living Arrangements Alone   Type of [place] to enter   Entrance Stairs-Number of Steps 3   Entrance Stairs-Rails None   Home Layout One level   [place] bars - tub/shower   Additional Comments denies difficulty with stairs     Prior Function   Level of Independence Independent   Vocation Retired   Biomedical scientist retired Therapist, sports   Leisure fishing (hasn't been on boat in ~ 5 years), goes to [employer]     Observation/Other Assessments   Focus on [place] (FOTO)  37 (63% limited; predicted 49% limited)     Posture/Postural Control   Posture/Postural Control Postural limitations   Postural Limitations Rounded Shoulders;Forward  head;Increased thoracic kyphosis;Decreased lumbar lordosis;Right pelvic obliquity   Posture Comments R hip elevated in stance     AROM   AROM Assessment Site Lumbar   Lumbar Flexion 90   Lumbar Extension 18   Lumbar - Right Side Bend 25  increase pain on R side   Lumbar - Left Side Bend 27     Strength   Strength Assessment Site Hip;Knee;Ankle   Right Hip Flexion 4/5   Right Hip Extension 3-/5   Right Hip ABduction 3/5   Right Hip ADduction 3+/5   Left Hip Flexion 3/5   Left Hip Extension 3-/5   Left Hip ABduction 3-/5   Left Hip ADduction 3+/5   Right/Left Knee Right;Left   Right Knee Flexion 4/5   Right Knee Extension 4/5   Left Knee Flexion 3/5   Left Knee Extension 3+/5   Right Ankle Dorsiflexion 5/5   Left Ankle Dorsiflexion 3/5     Flexibility   Soft Tissue Assessment /Muscle Length yes   Hamstrings tightness L > R   Piriformis tightness bil     Palpation   Spinal mobility hypomobility   SI assessment  tenderness bil     Special Tests    Special Tests Lumbar   Lumbar Tests Straight Leg Raise     Straight Leg Raise   Findings Negative   Comment bil                   [facility] Adult PT Treatment/Exercise - 09/27/16 1107      Lumbar Exercises: Stretches   Passive Hamstring Stretch 1 rep;30 seconds   Passive Hamstring Stretch Limitations bil; supine with strap; for HEP instruction   Single Knee to Chest Stretch 1 rep;30 seconds   Single Knee to Chest Stretch Limitations bil; for HEP instruction   Piriformis Stretch 1 rep;30 seconds   Piriformis Stretch Limitations bil, for HEP instruction                PT Education - 09/27/16 1152    Education provided Yes   Education Details clinical findings, POC, goals of care, HEP   Person(s) Educated Patient   Methods Explanation;Demonstration;Handout   Comprehension Verbalized understanding;Returned demonstration             PT [place] Term Goals - 09/27/16 1157      PT Eman[place]le TERM GOAL #1    Title independent with HEP (target date for all LTGs: 11/08/16)   Time 6   Period Weeks   Status New     PT T[place]  TERM GOAL #2   Title verbalize understanding of posture/body mechanics to decrease risk of reinjury    Time 6   Period Weeks   Status New     PT O[place]o TERM GOAL #3   Title perform lumbar ROM without increase in pain for improved mobility   Time 6   Period Weeks   Status New     PT Denk TERM GOAL #4   Title report ability to amb > 20 min without increase in pain for improved function   Time 6   Period Weeks   Status New     PT Brandle TERM GOAL #5   Title perform simulated ADLs without increase in pain in order to return to home responsibilities   Time 6   Period Weeks   Status New               Plan - 09/27/16 1154    Clinical Impression Statement Pt is a 80 y/o female who presents to OPPT for moderate complexity PT eval for LBP.  Pt reports ~ 1 in leg length descrepancy (R longer than L) which MD put 5/8" lift in shoe to correct but continues to have R pelvic obliquity.  Will plan to monitor and determine if additional intervention is needed.  Pt also demonstrates poor postural control, core and hip stability as well as decreased functional mobility.  Will benefit from PT to address deficits listed.   Rehab Potential Good   PT Frequency 2x / week   PT Duration 6 weeks   PT Treatment/Interventions ADLs/Self Care Home Management;Cryotherapy;Electrical Stimulation;Moist Heat;Ultrasound;Neuromuscular re-education;Therapeutic exercise;Therapeutic activities;Functional mobility training;Stair training;Gait training;Patient/family education;Orthotic Fit/Training;Manual techniques   PT Next Visit Plan review HEP, add core/hip strengthening exercises   Consulted and Agree with Plan of Care Patient      Patient will benefit from skilled therapeutic intervention in order to improve the following deficits and impairments:  Pain, Decreased strength, Decreased mobility,  Difficulty walking, Postural dysfunction, Impaired flexibility, Decreased range of motion  Visit Diagnosis: Chronic left-sided low back pain with left-sided sciatica - Plan: PT plan of care cert/re-cert  Muscle weakness (generalized) - Plan: PT plan of care cert/re-cert  Abnormal posture - Plan: PT plan of care cert/re-cert      G-Codes - 63/84/53 1200    Functional Assessment Tool Used FOTO   Functional Limitation Mobility: Walking and moving around   Mobility: Walking and Moving Around Current Status (M4680) At least 60 percent but less than 80 percent impaired, limited or restricted   Mobility: Walking and Moving Around Goal Status (H2122) At least 40 percent but less than 60 percent impaired, limited or restricted       Problem List Patient Active Problem List   Diagnosis Date Noted  . HTN (hypertension) 06/18/2016  . Multifocal atrial tachycardia ([place]) 11/26/2014  . PVD (peripheral vascular disease) ([place]) 12/15/2012       Laureen Abrahams, PT, DPT 09/27/16 12:02 PM    [facility] [address]  [place], [place], [address] Phone: [phone]   Fax:  [phone]  Name: KYMBERLY BLOMBERG MRN: [id number] Date of Birth: 1936-08-09

## 2016-10-01 ENCOUNTER — Ambulatory Visit: Payer: Medicare HMO | Admitting: Physical Therapy

## 2016-10-01 DIAGNOSIS — M6281 Muscle weakness (generalized): Secondary | ICD-10-CM

## 2016-10-01 DIAGNOSIS — M5442 Lumbago with sciatica, left side: Secondary | ICD-10-CM | POA: Diagnosis not present

## 2016-10-01 DIAGNOSIS — G8929 Other chronic pain: Secondary | ICD-10-CM

## 2016-10-01 DIAGNOSIS — R293 Abnormal posture: Secondary | ICD-10-CM

## 2016-10-01 NOTE — Therapy (Signed)
[facility] [address]  [place], [place], [address] Phone: [phone]   Fax:  [phone]  Physical Therapy Treatment  Patient Details  Name: Krista Mason MRN: [id number] Date of Birth: 1936/09/16 Referring Provider: Dr. Wandra Feinstein  Encounter Date: 10/01/2016      PT End of Session - 10/01/16 1342    Visit Number 2   Number of Visits 12   Date for PT Re-Evaluation 11/08/16   PT Start Time 1016   PT Stop Time 1101   PT Time Calculation (min) 45 min   Activity Tolerance Patient tolerated treatment well   Behavior During Therapy [facility] for tasks assessed/performed      Past Medical History:  Diagnosis Date  . Atrial fibrillation ([place])   . Chronic kidney disease   . Diabetes mellitus without complication ([place])   . Hx of echocardiogram    Echo (1/16):  EF 60-65%, no RWMA, Gr 1 DD, mod AI, mod MR, mild LAE  . Hyperkalemia   . Hyperlipidemia   . Hypertension   . Orthostatic hypotension   . PVC (premature ventricular contraction)   . Rheumatic fever   . Sleep apnea     Past Surgical History:  Procedure Laterality Date  . KNEE SURGERY Left 2009  . OVARIAN CYST REMOVAL      There were no vitals filed for this visit.      Subjective Assessment - 10/01/16 1019    Subjective Patient reporting L hip pain this weekend - almost had to use RW - felt like L leg was going to "give way"   Pertinent History CKD, afib, DM, HLD, HTN, rhematic fever   Diagnostic tests MRI: slight herniated disc L3-5; mod to severe arthritis, and compression fx only 25% healed   Patient Stated Goals improve pain, return to normal daily activities   Currently in Pain? Yes   Pain Score 3    Pain Location Back   Pain Orientation Lower;Medial   Pain Descriptors / Indicators Aching;Dull   Pain Type Chronic pain   Pain Onset More than a month ago   Pain Frequency Constant   Aggravating Factors  sitting, standing, walking,  bending, lifting   Pain Relieving Factors sitting in lounge chair, tylenol                         [facility] Adult PT Treatment/Exercise - 10/01/16 1021      Exercises   Exercises Knee/Hip;Lumbar     Lumbar Exercises: Stretches   Passive Hamstring Stretch 3 reps;30 seconds   Passive Hamstring Stretch Limitations B - supine with strap   Single Knee to Chest Stretch 1 rep;30 seconds   Single Knee to Chest Stretch Limitations B - supine   Piriformis Stretch 1 rep;30 seconds   Piriformis Stretch Limitations B - HEP review     Lumbar Exercises: Supine   Ab Set 10 reps;3 seconds   Clam 10 reps   Clam Limitations with red tband (unilater hip abduction)   Bridge 15 reps;3 seconds     Knee/Hip Exercises: Aerobic   Nustep level 3 x 5 minutes     Knee/Hip Exercises: Supine   Straight Leg Raises Strengthening;Both;15 reps     Knee/Hip Exercises: Sidelying   Clams 10 reps with red tband at knees - VC to not roll back                PT Education -  10/01/16 1341    Education provided Yes   Education Details HEP for gentle strengthening   Person(s) Educated Patient   Methods Explanation;Demonstration;Handout   Comprehension Verbalized understanding;Returned demonstration;Need further instruction             PT [place] Term Goals - 09/27/16 1157      PT [place] TERM GOAL #1   Title independent with HEP (target date for all LTGs: 11/08/16)   Time 6   Period Weeks   Status New     PT [place]ie TERM GOAL #2   Title verbalize understanding of posture/body mechanics to decrease risk of reinjury    Time 6   Period Weeks   Status New     PT Y[place]owski TERM GOAL #3   Title perform lumbar ROM without increase in pain for improved mobility   Time 6   Period Weeks   Status New     PT Gogg[place] TERM GOAL #4   Title report ability to amb > 20 min without increase in pain for improved function   Time 6   Period Weeks   Status New     PT [place] TERM GOAL #5   Title perform  simulated ADLs without increase in pain in order to return to home responsibilities   Time 6   Period Weeks   Status New               Plan - 10/01/16 1342    Clinical Impression Statement Patient stating that she only did HEP 1 time over the weekend and experienced hip back pain limiting overall function - unsure if due to HEP or other factor. HEP reviewed with patient today with patient requiring further instruction on tasks to perofrm correctly. PT session focusing on addition of gentle hip and core strengthening this visit with patient able to perform all tasks with no increase in pain. HEP handout givent o paitent to perorm at least once daily to maintain and progress functional gains. Patient to continue to benefit from continued PT to maximize function.    PT Treatment/Interventions ADLs/Self Care Home Management;Cryotherapy;Electrical Stimulation;Moist Heat;Ultrasound;Neuromuscular re-education;Therapeutic exercise;Therapeutic activities;Functional mobility training;Stair training;Gait training;Patient/family education;Orthotic Fit/Training;Manual techniques   PT Next Visit Plan progress core and hip strengthening   Consulted and Agree with Plan of Care Patient      Patient will benefit from skilled therapeutic intervention in order to improve the following deficits and impairments:  Pain, Decreased strength, Decreased mobility, Difficulty walking, Postural dysfunction, Impaired flexibility, Decreased range of motion  Visit Diagnosis: Chronic left-sided low back pain with left-sided sciatica  Muscle weakness (generalized)  Abnormal posture     Problem List Patient Active Problem List   Diagnosis Date Noted  . HTN (hypertension) 06/18/2016  . Multifocal atrial tachycardia ([place]) 11/26/2014  . PVD (peripheral vascular disease) ([place]) 12/15/2012       Lanney Gins, PT, DPT 10/01/16 1:47 PM     [facility] [address]  [place], [place], [address] Phone: [phone]   Fax:  [phone]  Name: Krista Mason MRN: [id number] Date of Birth: 1936/09/19

## 2016-10-01 NOTE — Patient Instructions (Signed)
Bridge    Lie back, legs bent. Inhale, pressing hips up. Keeping ribs in, lengthen lower back. Exhale, rolling down along spine from top. Repeat _10-15___ times. Hold 3-5 seconds.     Straight Leg Raise    With one knee bent, other leg straight 10 leg lifts    Pelvic Tilt: Posterior - Legs Bent (Supine)    Tighten stomach and flatten back by rolling pelvis down. Hold _5___ seconds. Relax. Repeat __15__ times per set.     Clam Shell 90 Degrees: Two Pillows    Lying with hips and knees bent 90, two pillows between knees and ankles. Roll top knee up. Be sure pelvis does not roll backward. Do not arch back. Do __10_ times, each leg

## 2016-10-03 ENCOUNTER — Ambulatory Visit: Payer: Medicare HMO | Admitting: Physical Therapy

## 2016-10-03 DIAGNOSIS — M5442 Lumbago with sciatica, left side: Secondary | ICD-10-CM | POA: Diagnosis not present

## 2016-10-03 DIAGNOSIS — R293 Abnormal posture: Secondary | ICD-10-CM

## 2016-10-03 DIAGNOSIS — G8929 Other chronic pain: Secondary | ICD-10-CM

## 2016-10-03 DIAGNOSIS — M6281 Muscle weakness (generalized): Secondary | ICD-10-CM

## 2016-10-03 NOTE — Therapy (Signed)
[facility] [address]  [place], [place], [address] Phone: [phone]   Fax:  [phone]  Physical Therapy Treatment  Patient Details  Name: Krista Mason MRN: [id number] Date of Birth: Mar 04, 1936 Referring Provider: Dr. Wandra Feinstein  Encounter Date: 10/03/2016      PT End of Session - 10/03/16 1329    Visit Number 3   Number of Visits 12   Date for PT Re-Evaluation 11/08/16   PT Start Time 1314   PT Stop Time 1400   PT Time Calculation (min) 46 min   Activity Tolerance Patient tolerated treatment well   Behavior During Therapy [facility] for tasks assessed/performed      Past Medical History:  Diagnosis Date  . Atrial fibrillation ([place])   . Chronic kidney disease   . Diabetes mellitus without complication ([place])   . Hx of echocardiogram    Echo (1/16):  EF 60-65%, no RWMA, Gr 1 DD, mod AI, mod MR, mild LAE  . Hyperkalemia   . Hyperlipidemia   . Hypertension   . Orthostatic hypotension   . PVC (premature ventricular contraction)   . Rheumatic fever   . Sleep apnea     Past Surgical History:  Procedure Laterality Date  . KNEE SURGERY Left 2009  . OVARIAN CYST REMOVAL      There were no vitals filed for this visit.      Subjective Assessment - 10/03/16 1321    Subjective Patient reporting she did not HEP since last visit - wrapped presents yesterday and had to take multiple seated rest breaks    Pertinent History CKD, afib, DM, HLD, HTN, rhematic fever   Diagnostic tests MRI: slight herniated disc L3-5; mod to severe arthritis, and compression fx only 25% healed   Patient Stated Goals improve pain, return to normal daily activities   Currently in Pain? Yes   Pain Score 3    Pain Location Back   Pain Orientation Left;Medial   Pain Descriptors / Indicators Aching   Pain Type Chronic pain   Pain Onset More than a month ago   Pain Frequency Constant   Aggravating Factors  sitting, standing,  walking, bending, lifting   Pain Relieving Factors sitting in lounge chair, tylenol                         [facility] Adult PT Treatment/Exercise - 10/03/16 1332      Lumbar Exercises: Supine   Ab Set 10 reps;3 seconds   AB Set Limitations progression to arms overhead x 10 reps with 3-5 second hold   Clam 10 reps   Clam Limitations with red tband (unilater hip abduction) - with ab set prior to movement   Bridge 15 reps;3 seconds   Bridge Limitations with red tband at knees     Knee/Hip Exercises: Aerobic   Nustep level 4 x 7 minutes     Knee/Hip Exercises: Standing   Heel Raises 15 reps   Heel Raises Limitations with B UE support   Hip Abduction Stengthening;Both;10 reps;Knee straight   Abduction Limitations with B UE support     Knee/Hip Exercises: Supine   Straight Leg Raises Strengthening;Both;15 reps   Straight Leg Raises Limitations 1#     Knee/Hip Exercises: Sidelying   Clams 15 reps B LE - red tband at knees  PT [place] Term Goals - 09/27/16 1157      PT Massmann TERM GOAL #1   Title independent with HEP (target date for all LTGs: 11/08/16)   Time 6   Period Weeks   Status New     PT [place] TERM GOAL #2   Title verbalize understanding of posture/body mechanics to decrease risk of reinjury    Time 6   Period Weeks   Status New     PT [place]din TERM GOAL #3   Title perform lumbar ROM without increase in pain for improved mobility   Time 6   Period Weeks   Status New     PT [place] TERM GOAL #4   Title report ability to amb > 20 min without increase in pain for improved function   Time 6   Period Weeks   Status New     PT W[place] TERM GOAL #5   Title perform simulated ADLs without increase in pain in order to return to home responsibilities   Time 6   Period Weeks   Status New               Plan - 10/03/16 1329    Clinical Impression Statement Patient continuing to report that she has not done HEP - attirbutes not  being able to do it due to busy schedule and pain after wrapping presents. Patient today with some increased pain while on NuStep ( up to 5/10) that quickly resolved after completion of task. PT session focusing on good core engagement with tasks for improved lumbopelvic support. Patient to continue to benefit from PT to maximize function.    PT Treatment/Interventions ADLs/Self Care Home Management;Cryotherapy;Electrical Stimulation;Moist Heat;Ultrasound;Neuromuscular re-education;Therapeutic exercise;Therapeutic activities;Functional mobility training;Stair training;Gait training;Patient/family education;Orthotic Fit/Training;Manual techniques   PT Next Visit Plan progress core and hip strengthening   Consulted and Agree with Plan of Care Patient      Patient will benefit from skilled therapeutic intervention in order to improve the following deficits and impairments:  Pain, Decreased strength, Decreased mobility, Difficulty walking, Postural dysfunction, Impaired flexibility, Decreased range of motion  Visit Diagnosis: Chronic left-sided low back pain with left-sided sciatica  Muscle weakness (generalized)  Abnormal posture     Problem List Patient Active Problem List   Diagnosis Date Noted  . HTN (hypertension) 06/18/2016  . Multifocal atrial tachycardia ([place]) 11/26/2014  . PVD (peripheral vascular disease) ([place]) 12/15/2012      Lanney Gins, PT, DPT 10/03/16 2:18 PM    [facility] [address]  [place], [place], [address] Phone: [phone]   Fax:  [phone]  Name: Krista Mason MRN: [id number] Date of Birth: [birth year]

## 2016-10-04 DIAGNOSIS — K222 Esophageal obstruction: Secondary | ICD-10-CM | POA: Diagnosis not present

## 2016-10-04 DIAGNOSIS — K209 Esophagitis, unspecified: Secondary | ICD-10-CM | POA: Diagnosis not present

## 2016-10-08 ENCOUNTER — Ambulatory Visit: Payer: Medicare HMO | Admitting: Physical Therapy

## 2016-10-10 ENCOUNTER — Ambulatory Visit: Payer: Medicare HMO | Admitting: Physical Therapy

## 2016-10-10 DIAGNOSIS — M2170 Unequal limb length (acquired), unspecified site: Secondary | ICD-10-CM | POA: Diagnosis not present

## 2016-10-10 DIAGNOSIS — M545 Low back pain: Secondary | ICD-10-CM | POA: Diagnosis not present

## 2016-10-12 ENCOUNTER — Encounter: Payer: Self-pay | Admitting: Family

## 2016-10-17 ENCOUNTER — Ambulatory Visit: Payer: Medicare HMO | Admitting: Physical Therapy

## 2016-10-17 DIAGNOSIS — M6281 Muscle weakness (generalized): Secondary | ICD-10-CM

## 2016-10-17 DIAGNOSIS — M5442 Lumbago with sciatica, left side: Principal | ICD-10-CM

## 2016-10-17 DIAGNOSIS — R293 Abnormal posture: Secondary | ICD-10-CM

## 2016-10-17 DIAGNOSIS — G8929 Other chronic pain: Secondary | ICD-10-CM

## 2016-10-17 NOTE — Therapy (Addendum)
[facility] [address]  [place], [place], [address] Phone: [phone]   Fax:  [phone]  Physical Therapy Treatment  Patient Details  Name: Krista Mason MRN: [id number] Date of Birth: [birth year] Referring Provider: Dr. Wandra Feinstein  Encounter Date: 10/17/2016      PT End of Session - 10/17/16 1104    Visit Number 4   Number of Visits 12   Date for PT Re-Evaluation 11/08/16   PT Start Time 1100   PT Stop Time 1142   PT Time Calculation (min) 42 min   Activity Tolerance Patient tolerated treatment well   Behavior During Therapy [facility] for tasks assessed/performed      Past Medical History:  Diagnosis Date  . Atrial fibrillation ([place])   . Chronic kidney disease   . Diabetes mellitus without complication ([place])   . Hx of echocardiogram    Echo (1/16):  EF 60-65%, no RWMA, Gr 1 DD, mod AI, mod MR, mild LAE  . Hyperkalemia   . Hyperlipidemia   . Hypertension   . Orthostatic hypotension   . PVC (premature ventricular contraction)   . Rheumatic fever   . Sleep apnea     Past Surgical History:  Procedure Laterality Date  . KNEE SURGERY Left 2009  . OVARIAN CYST REMOVAL      There were no vitals filed for this visit.      Subjective Assessment - 10/17/16 1102    Subjective Patient has been sick - had an endoscope since the last time she was here for esophageal stretching; continues to have some low back pain - has not done HEP due to some sickness   Pertinent History CKD, afib, DM, HLD, HTN, rhematic fever   Diagnostic tests MRI: slight herniated disc L3-5; mod to severe arthritis, and compression fx only 25% healed   Patient Stated Goals improve pain, return to normal daily activities   Currently in Pain? Yes   Pain Score 3    Pain Location Back   Pain Orientation Lower   Pain Descriptors / Indicators Aching;Dull   Pain Type Chronic pain   Pain Onset More than a month ago   Pain Frequency  Constant   Aggravating Factors  sitting, standing, walking, bending, lifting   Pain Relieving Factors sititng in lounge chair, tylenol                         [facility] Adult PT Treatment/Exercise - 10/17/16 1105      Lumbar Exercises: Stretches   Quadruped Mid Back Stretch Limitations rolling out red physioball left/mid/right 5 reps each with 5 second hold for lumbar stretch     Lumbar Exercises: Supine   Ab Set 15 reps;3 seconds   AB Set Limitations progression to arms overhead x 10 reps with 3-5 second hold; alternating LE marches with ab set x 10 reps    Clam 10 reps   Clam Limitations with green tband (unilater hip abduction) - with ab set prior to movement   Bridge 15 reps;3 seconds   Bridge Limitations with green tband at knees     Knee/Hip Exercises: Aerobic   Nustep level 5 x 5 minutes      Knee/Hip Exercises: Supine   Straight Leg Raises Strengthening;Both;15 reps   Straight Leg Raises Limitations 2#   Other Supine Knee/Hip Exercises straight leg bridge on peanut ball x 15 reps - 3-5 second hold at top  Knee/Hip Exercises: Sidelying   Clams 15 reps B LE - green tband at knees                     PT [place] Term Goals - 10/17/16 1146      PT [place] TERM GOAL #1   Title independent with HEP (target date for all LTGs: 11/08/16)   Time 6   Period Weeks   Status On-going     PT Weltman TERM GOAL #2   Title verbalize understanding of posture/body mechanics to decrease risk of reinjury    Time 6   Period Weeks   Status On-going     PT Swaminathan TERM GOAL #3   Title perform lumbar ROM without increase in pain for improved mobility   Time 6   Period Weeks   Status On-going     PT Robarts TERM GOAL #4   Title report ability to amb > 20 min without increase in pain for improved function   Time 6   Period Weeks   Status On-going     PT Mans TERM GOAL #5   Title perform simulated ADLs without increase in pain in order to return to home  responsibilities   Time 6   Period Weeks   Status On-going               Plan - 10/17/16 1104    Clinical Impression Statement Patient today with subjective reports of poor HEP compliance - due to recent illness. Discussion with patient regarding importance of compliance for functional progress and maintainence of gains made in PT treatment sessions. Patient today tolerating all progressions with no increase in pain. Patient with good core engagement today with very few VC on appropriate muscle activation. Patient to continue to benefit from PT to maximize function.    PT Treatment/Interventions ADLs/Self Care Home Management;Cryotherapy;Electrical Stimulation;Moist Heat;Ultrasound;Neuromuscular re-education;Therapeutic exercise;Therapeutic activities;Functional mobility training;Stair training;Gait training;Patient/family education;Orthotic Fit/Training;Manual techniques   PT Next Visit Plan progress core and hip strengthening   Consulted and Agree with Plan of Care Patient      Patient will benefit from skilled therapeutic intervention in order to improve the following deficits and impairments:  Pain, Decreased strength, Decreased mobility, Difficulty walking, Postural dysfunction, Impaired flexibility, Decreased range of motion  Visit Diagnosis: Chronic left-sided low back pain with left-sided sciatica  Muscle weakness (generalized)  Abnormal posture  G-Codes -    Functional Assessment Tool Used Clinical judgement   Functional Limitation Mobility: Walking and moving around   Mobility: Walking and Moving Around Goal Status [phone]) At least 40 percent but less than 60 percent impaired, limited or restricted   Mobility: Walking and Moving Around Discharge Status [phone]) At least 60 percent but less than 80 percent impaired, limited or restricted        Problem List Patient Active Problem List   Diagnosis Date Noted  . HTN (hypertension) 06/18/2016  . Multifocal  atrial tachycardia ([place]) 11/26/2014  . PVD (peripheral vascular disease) ([place]) 12/15/2012     Lanney Gins, PT, DPT 10/17/16 11:46 AM   PHYSICAL THERAPY DISCHARGE SUMMARY  Visits from Start of Care: 4  Current functional level related to goals / functional outcomes: See above   Remaining deficits: See above; poor HEP compliance, low back pain, reduced strength, poor activity tolerance and endurance   Education / Equipment: HEP  Plan: Patient agrees to discharge.  Patient goals were not met. Patient is being discharged due to the patient's request.  ?????  Patient calling requesting to be d/c from PT due to newly diagnosed CHF. Wishing to stop therapy until she is feeling better and gets medical comorbidities under control. Will gladly see patient in the future for any PT needs.  Lanney Gins, PT, DPT 11/27/16 3:28 PM  [facility] [address]  [place], [place], [address] Phone: [phone]   Fax:  [phone]  Name: Krista Mason MRN: [id number] Date of Birth: 22-Jun-1936

## 2016-10-19 ENCOUNTER — Telehealth: Payer: Self-pay | Admitting: Physician Assistant

## 2016-10-19 ENCOUNTER — Encounter: Payer: Self-pay | Admitting: Cardiology

## 2016-10-19 DIAGNOSIS — R05 Cough: Secondary | ICD-10-CM | POA: Diagnosis not present

## 2016-10-19 DIAGNOSIS — I509 Heart failure, unspecified: Secondary | ICD-10-CM | POA: Diagnosis not present

## 2016-10-19 NOTE — Telephone Encounter (Signed)
Patient called to report she went to Dr. Eddie Dibbles office today, a chest xray was completed and she was told she had an enlarged heart and heart failure. He started her on Lasix 40 mg daily and sent her home. They also called [facility] to schedule appointment - they were given 10/26/16. He instructed her to give the lasix "48 hours to do its work." She had her first dose around lunchtime today. The patient is not in acute distress - she reports SOB on exertion and at night when she lies flat.  She denies CP and swelling. Encouraged her to take Lasix as directed. Encouraged her to limit salt. Recommended she sleep in her recliner until Lasix "kicks in" and she is able to sleep better. Rescheduled her for earlier appointment Tuesday at 0830 with B. Rosita Fire, [place]. She understands to go to ED immediately if breathing worsens over the weekend. Requested notes from Dr. Eddie Dibbles office. Patient was grateful for assistance.   Med list updated.

## 2016-10-19 NOTE — Telephone Encounter (Signed)
New Message   Pt c/o Shortness Of Breath: STAT if SOB developed within the last 24 hours or pt is noticeably SOB on the phone  1. Are you currently SOB (can you hear that pt is SOB on the phone)? Yes  2. How Garden have you been experiencing SOB? Started 2 weeks ago, and has gotten worse  3. Are you SOB when sitting or when up moving around? More when moving around  4. Are you currently experiencing any other symptoms? Cough  Tried to call triage no answer/Patient also has visit on 1/5 with bhagat

## 2016-10-23 ENCOUNTER — Encounter: Payer: Self-pay | Admitting: Cardiology

## 2016-10-23 ENCOUNTER — Ambulatory Visit (INDEPENDENT_AMBULATORY_CARE_PROVIDER_SITE_OTHER): Payer: Medicare HMO | Admitting: Cardiology

## 2016-10-23 ENCOUNTER — Telehealth (HOSPITAL_COMMUNITY): Payer: Self-pay | Admitting: *Deleted

## 2016-10-23 VITALS — BP 190/80 | HR 104 | Ht 66.0 in | Wt 132.0 lb

## 2016-10-23 DIAGNOSIS — R0600 Dyspnea, unspecified: Secondary | ICD-10-CM

## 2016-10-23 DIAGNOSIS — R0609 Other forms of dyspnea: Secondary | ICD-10-CM | POA: Diagnosis not present

## 2016-10-23 DIAGNOSIS — R079 Chest pain, unspecified: Secondary | ICD-10-CM | POA: Diagnosis not present

## 2016-10-23 NOTE — Progress Notes (Signed)
10/23/2016 [name]   [birth year]  [id number]  Primary Physician Krista Heck, MD Primary Cardiologist: Dr. 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 She was evaluated by Dr. Trula Mason in 2014 for PAD. ABIs: R 0.94 and L 0.73. Medical Rx was recommended at that time.She is followed by Dr. Acie Mason.   She was recently diagnosed with CHF. She saw her PCP, Dr. Rex Mason, given symptoms of exertional dyspnea and orthopnea. Per Pt report, she was evaluated with a CXR and was told that she had an enlarged heart and CHF. Dr. Rex Mason started her on PO lasix. She also recently had a chest CT in September 2017 that showed coronary artery calcification and atherosclerotic calcification in the wall of the thoracic aorta. Of note, she had a 2D echo in Jan. 2016 that showed normal LVEF of 60-65% with grade 1 DD, moderate AI and moderate MR. She presents to our clinic for f/u.   She notes lasix has helped but she still has mild DOE. No resting dyspnea. She notes symptoms of orthopnea. Her LEE resolved with lasix. She also notes exertional chest pressure/tightness with activity. No resting chest pain. She notes occasional palpitations. Her BP is elevated today at 190/80 and HR is 114 however, she reports that she did not take her morning meds prior to her appt this morning. She plans to take her meds when she leaves. She is resistant to increasing her metoprolol, as she reports she had bradycardia on higher doses with rates in the 40s.      Current Meds  Medication Sig  . acetaminophen (TYLENOL) 325 MG tablet Take 325 mg by mouth every 6 (six) hours as needed. For pain  . aspirin 81 MG tablet Take 81 mg by mouth daily.  . cholecalciferol (VITAMIN D) 1000 UNITS tablet Take 2,000 Units by mouth daily.   [name] diltiazem (TIAZAC) 120 MG 24 hr capsule Take 1 capsule (120 mg  total) by mouth daily.  . furosemide (LASIX) 40 MG tablet Take 40 mg by mouth daily.  . Imiquimod (ZYCLARA PUMP) 2.5 % CREA Apply topically as needed.  [name] losartan (COZAAR) 50 MG tablet Take 50 mg by mouth daily.  [name] lovastatin (MEVACOR) 20 MG tablet Take 20 mg by mouth.   . metFORMIN (GLUCOPHAGE) 500 MG tablet Take 500 mg by mouth 2 (two) times daily with a meal.  . metoprolol tartrate (LOPRESSOR) 25 MG tablet Take 0.5 tablets (12.5 mg total) by mouth 2 (two) times daily.  [name] omeprazole (PRILOSEC) 40 MG capsule TK 1 C PO QD  . ONE TOUCH ULTRA TEST test strip USE TO TEST YOUR BLOOD SUGAR ONCE DAILY UTD  . ONETOUCH DELICA LANCETS 40X MISC USE TO CHECK YOUR BLOOD SUGAR ONCE DAILY UTD  . Polyethyl Glycol-Propyl Glycol (SYSTANE OP) Apply 2 drops to eye 2 (two) times daily as needed. For dry eyes  . traMADol (ULTRAM) 50 MG tablet TK 1-2 TS PO Q 6-8 H PRF SEVERE PAIN  . [DISCONTINUED] esomeprazole (NEXIUM) 40 MG capsule Take 40 mg by mouth as needed.   Allergies  Allergen Reactions  . Penicillins   . Ceclor [Cefaclor] Itching  . Crestor [Rosuvastatin Calcium] Nausea Only  . Epinephrine Other (See Comments)    "feels like heart is beating out of chest"  . Nsaids     Pt states  she is not taking because of kidney function  . Other Other (See Comments)    FLU SHOT  . Percocet [Oxycodone-Acetaminophen] Nausea And Vomiting  . Prednisone     Dizziness, spiked blood sugar  . Lidocaine Hcl Palpitations   Past Medical History:  Diagnosis Date  . Atrial fibrillation ([place])   . Chronic kidney disease   . Diabetes mellitus without complication ([place])   . Hx of echocardiogram    Echo (1/16):  EF 60-65%, no RWMA, Gr 1 DD, mod AI, mod MR, mild LAE  . Hyperkalemia   . Hyperlipidemia   . Hypertension   . Orthostatic hypotension   . PVC (premature ventricular contraction)   . Rheumatic fever   . Sleep apnea    Family History  Problem Relation Age of Onset  . Heart attack Father   . Heart disease  Father   . Cancer Father   . Hypertension Father   . Hypertension Brother   . Stroke Brother   . Hypertension Brother   . Diabetes Son   . Hyperlipidemia Son   . Hypertension Son    Past Surgical History:  Procedure Laterality Date  . KNEE SURGERY Left 2009  . OVARIAN CYST REMOVAL     Social History   Social History  . Marital status: Widowed    Spouse name: N/A  . Number of children: N/A  . Years of education: N/A   Occupational History  . Not on file.   Social History Main Topics  . Smoking status: Current Every Day Smoker    Packs/day: 0.75    Years: 50.00  . Smokeless tobacco: Never Used     Comment: pt states " I'm thinking about it"  . Alcohol use No  . Drug use: No  . Sexual activity: Not on file   Other Topics Concern  . Not on file   Social History Narrative  . No narrative on file     Review of Systems: General: negative for chills, fever, night sweats or weight changes.  Cardiovascular: negative for chest pain, dyspnea on exertion, edema, orthopnea, palpitations, paroxysmal nocturnal dyspnea or shortness of breath Dermatological: negative for rash Respiratory: negative for cough or wheezing Urologic: negative for hematuria Abdominal: negative for nausea, vomiting, diarrhea, bright red blood per rectum, melena, or hematemesis Neurologic: negative for visual changes, syncope, or dizziness All other systems reviewed and are otherwise negative except as noted above.   Physical Exam:  Blood pressure (!) 190/80, pulse (!) 104, height 5\' 6"  (1.676 m), weight 132 lb (59.9 kg).  General appearance: alert, cooperative and no distress Neck: no carotid bruit and no JVD Lungs: clear to auscultation bilaterally Heart: regular rate and rhythm, S1, S2 normal, no murmur, click, rub or gallop Extremities: extremities normal, atraumatic, no cyanosis or edema Pulses: 2+ and symmetric Skin: Skin color, texture, turgor normal. No rashes or lesions Neurologic:  Grossly normal  EKG NSR. 63 bpm. Qt/QTc 390/399 ms.   ASSESSMENT AND PLAN:   1. Exertional Dyspnea/ Chest Pain: suspect acute CHF based on CXR findings and symptoms. Will check a BNP today and will check a 2D echo to assess LVF, valve anatomy and wall motion. We will also obtain a NST to r/o underlying ischemia given exertional CP, ? New onset HF and frequent ectopy. If reduced EF, we will need to stop Cardizem and change bet blocker from lopressor to Toprol XL Vs Coreg. May also consider 48 hr Holter monitor if reduced EF but normal perfusion,  to assess PVC burden. Continue Lasix. Check BMP today to assess renal function and K. We will also check a Mg level, TSH and CBC. F/u in 1 -2 weeks after w/u is complete.   2. Valvular Disease: Moderate MR and AI noted on last echo in 2016. Will repeat echo given development of dyspnea.   3. HTN: BP is elevated today but patient reports that she did not take morning meds prior to appt. Pt advised to continue losartan, lasix, cardizem and metoprolol. Reassess BP at next f/u visit.   4. PVCs: currently on Cardizem and metoprolol. If echo shows reduced EF, we will need to d/c Cardizem. We discussed upward titration of metoprolol, however patient is resistant to this change today as she notes prior h/o significant bradycardia on higher doses of metoprolol. If EF is reduced and NST is negative, will consider 48 holter monitor to assess PVC burden, as this may be the cause of potential HF/ cardiomyopathy. Check THS, Mg and K today.    Krista Jester PA-C 10/23/2016 12:49 PM

## 2016-10-23 NOTE — Patient Instructions (Signed)
Medication Instructions:  Your physician recommends that you continue on your current medications as directed. Please refer to the Current Medication list given to you today.   Labwork: TODAY BMET, BNP  Testing/Procedures: 1. Your physician has requested that you have a lexiscan myoview. For further information please visit [email or web address]. Please follow instruction sheet, as given.  2. Your physician has requested that you have an echocardiogram. Echocardiography is a painless test that uses sound waves to create images of your heart. It provides your doctor with information about the size and shape of your heart and how well your heart's chambers and valves are working. This procedure takes approximately one hour. There are no restrictions for this procedure.    Follow-Up: PER BRITTANY SIMMONS, PAC... YOU WILL NEED TO FOLLOW UP IN 1-2 WEEKS WITH HER OR DR. Acie Fredrickson AFTER  YOUR TEST HAVE BEEN COMPLETED  Any Other Special Instructions Will Be Listed Below (If Applicable).     If you need a refill on your cardiac medications before your next appointment, please call your pharmacy.

## 2016-10-23 NOTE — Telephone Encounter (Signed)
Patient given detailed instructions per Myocardial Perfusion Study Information Sheet for the test on 10/24/16 at 0715. Patient notified to arrive 15 minutes early and that it is imperative to arrive on time for appointment to keep from having the test rescheduled.  If you need to cancel or reschedule your appointment, please call the office within 24 hours of your appointment. Failure to do so may result in a cancellation of your appointment, and a $50 no show fee. Patient verbalized understanding.Jasiri Hanawalt, [place]e [place]

## 2016-10-24 ENCOUNTER — Ambulatory Visit (HOSPITAL_BASED_OUTPATIENT_CLINIC_OR_DEPARTMENT_OTHER): Payer: Medicare HMO

## 2016-10-24 ENCOUNTER — Ambulatory Visit (HOSPITAL_COMMUNITY): Payer: Medicare HMO | Attending: Cardiovascular Disease

## 2016-10-24 ENCOUNTER — Telehealth: Payer: Self-pay | Admitting: Cardiovascular Disease

## 2016-10-24 ENCOUNTER — Other Ambulatory Visit: Payer: Self-pay

## 2016-10-24 ENCOUNTER — Ambulatory Visit: Payer: Medicare HMO | Admitting: Physical Therapy

## 2016-10-24 DIAGNOSIS — I371 Nonrheumatic pulmonary valve insufficiency: Secondary | ICD-10-CM | POA: Diagnosis not present

## 2016-10-24 DIAGNOSIS — I313 Pericardial effusion (noninflammatory): Secondary | ICD-10-CM | POA: Insufficient documentation

## 2016-10-24 DIAGNOSIS — I129 Hypertensive chronic kidney disease with stage 1 through stage 4 chronic kidney disease, or unspecified chronic kidney disease: Secondary | ICD-10-CM | POA: Insufficient documentation

## 2016-10-24 DIAGNOSIS — R69 Illness, unspecified: Secondary | ICD-10-CM | POA: Diagnosis not present

## 2016-10-24 DIAGNOSIS — I493 Ventricular premature depolarization: Secondary | ICD-10-CM | POA: Insufficient documentation

## 2016-10-24 DIAGNOSIS — E1122 Type 2 diabetes mellitus with diabetic chronic kidney disease: Secondary | ICD-10-CM | POA: Diagnosis not present

## 2016-10-24 DIAGNOSIS — R0609 Other forms of dyspnea: Secondary | ICD-10-CM

## 2016-10-24 DIAGNOSIS — N189 Chronic kidney disease, unspecified: Secondary | ICD-10-CM | POA: Diagnosis not present

## 2016-10-24 DIAGNOSIS — R0600 Dyspnea, unspecified: Secondary | ICD-10-CM

## 2016-10-24 DIAGNOSIS — F17200 Nicotine dependence, unspecified, uncomplicated: Secondary | ICD-10-CM | POA: Diagnosis not present

## 2016-10-24 DIAGNOSIS — E785 Hyperlipidemia, unspecified: Secondary | ICD-10-CM | POA: Insufficient documentation

## 2016-10-24 DIAGNOSIS — I080 Rheumatic disorders of both mitral and aortic valves: Secondary | ICD-10-CM | POA: Insufficient documentation

## 2016-10-24 DIAGNOSIS — R079 Chest pain, unspecified: Secondary | ICD-10-CM

## 2016-10-24 LAB — BASIC METABOLIC PANEL
BUN/Creatinine Ratio: 19 (ref 12–28)
BUN: 23 mg/dL (ref 8–27)
CALCIUM: 9.3 mg/dL (ref 8.7–10.3)
CHLORIDE: 97 mmol/L (ref 96–106)
CO2: 21 mmol/L (ref 18–29)
Creatinine, Ser: 1.24 mg/dL — ABNORMAL HIGH (ref 0.57–1.00)
GFR calc non Af Amer: 42 mL/min/{1.73_m2} — ABNORMAL LOW (ref >59)
GFR, EST AFRICAN AMERICAN: 48 mL/min/{1.73_m2} — AB (ref >59)
Glucose: 123 mg/dL — ABNORMAL HIGH (ref 65–99)
POTASSIUM: 4.2 mmol/L (ref 3.5–5.2)
Sodium: 137 mmol/L (ref 134–144)

## 2016-10-24 LAB — MYOCARDIAL PERFUSION IMAGING
CHL CUP NUCLEAR SRS: 1
NUC STRESS TID: 0.98
Peak HR: 85 {beats}/min
RATE: 0.34
Rest HR: 71 {beats}/min
SDS: 2
SSS: 3

## 2016-10-24 LAB — SPECIMEN STATUS

## 2016-10-24 LAB — BRAIN NATRIURETIC PEPTIDE

## 2016-10-24 MED ORDER — TECHNETIUM TC 99M TETROFOSMIN IV KIT
10.1000 | PACK | Freq: Once | INTRAVENOUS | Status: AC | PRN
  Administered 2016-10-24: 10.1 via INTRAVENOUS
  Filled 2016-10-24: qty 11

## 2016-10-24 MED ORDER — REGADENOSON 0.4 MG/5ML IV SOLN
0.4000 mg | Freq: Once | INTRAVENOUS | Status: AC
  Administered 2016-10-24: 0.4 mg via INTRAVENOUS

## 2016-10-24 MED ORDER — AMINOPHYLLINE 25 MG/ML IV SOLN
75.0000 mg | Freq: Once | INTRAVENOUS | Status: AC
  Administered 2016-10-24: 75 mg via INTRAVENOUS

## 2016-10-24 MED ORDER — TECHNETIUM TC 99M TETROFOSMIN IV KIT
32.3000 | PACK | Freq: Once | INTRAVENOUS | Status: AC | PRN
  Administered 2016-10-24: 32.3 via INTRAVENOUS
  Filled 2016-10-24: qty 33

## 2016-10-24 NOTE — Telephone Encounter (Signed)
I spoke with Krista Mason at [facility]. He reports they received BNP but blood was whole blood in a lavender tube. They need plasma from a lavender tube.

## 2016-10-24 NOTE — Telephone Encounter (Signed)
Reviewed with Con Memos (Publishing copy) and Lanny Hurst from our lab with contact [place]

## 2016-10-24 NOTE — Telephone Encounter (Signed)
She has normal LV systolic function Moderate valvular disease Agree with Lasix for now.

## 2016-10-24 NOTE — Telephone Encounter (Signed)
New Message  Krista Mason voiced he is calling in regards to pt labs.  Please f/u

## 2016-10-25 ENCOUNTER — Telehealth: Payer: Self-pay | Admitting: *Deleted

## 2016-10-25 NOTE — Telephone Encounter (Signed)
Pt notified of echo results and findings by phone with verbal understanding. Pt is agreeable to recommendations per Ellen Henri, PA to be seen tomorrow 10/26/16 @ 2 pm. Pt aware at [place] 10/26/16 she will be set up with a 48 hour heart monitor as well as adjustment to Cardizem and Metoprolol. Pt asked me if I had her lab results in. I placed pt on hold and asked Ellen Henri, PA for lab results. I reviewed lab results with the pt and she is understanding to continue on current Tx plan until visit tomorrow.  I cannot note on lab results since I do not have access to [place], [place], [place]. [place] aware pt aware of all test results at this time.

## 2016-10-25 NOTE — Telephone Encounter (Signed)
BNP resulted.

## 2016-10-26 ENCOUNTER — Encounter: Payer: Self-pay | Admitting: Cardiology

## 2016-10-26 ENCOUNTER — Ambulatory Visit (INDEPENDENT_AMBULATORY_CARE_PROVIDER_SITE_OTHER): Payer: Medicare HMO

## 2016-10-26 ENCOUNTER — Ambulatory Visit: Payer: Medicare HMO | Admitting: Physician Assistant

## 2016-10-26 ENCOUNTER — Ambulatory Visit (INDEPENDENT_AMBULATORY_CARE_PROVIDER_SITE_OTHER): Payer: Medicare HMO | Admitting: Cardiology

## 2016-10-26 VITALS — BP 140/78 | HR 58 | Ht 66.0 in | Wt 132.8 lb

## 2016-10-26 DIAGNOSIS — I493 Ventricular premature depolarization: Secondary | ICD-10-CM

## 2016-10-26 MED ORDER — METOPROLOL TARTRATE 25 MG PO TABS: 25.0000 mg | ORAL_TABLET | Freq: Two times a day (BID) | ORAL | 11 refills | Status: DC

## 2016-10-26 NOTE — Patient Instructions (Addendum)
Medication Instructions:  1) DISCONTINUE Cardizem 2) INCREASE Metoprolol Tartrate to 25mg  twice daily  Labwork: None  Testing/Procedures: Your physician has recommended that you wear a 48 hour holter monitor. Holter monitors are medical devices that record the heart's electrical activity. Doctors most often use these monitors to diagnose arrhythmias. Arrhythmias are problems with the speed or rhythm of the heartbeat. The monitor is a small, portable device. You can wear one while you do your normal daily activities. This is usually used to diagnose what is causing palpitations/syncope (passing out).    Follow-Up: Your physician recommends that you schedule a follow-up appointment in: 2 weeks with one of our EP physicians. (This should be after holter is completed)  Your physician recommends that you schedule a follow-up appointment in: 4-6 weeks with Dr. Acie Fredrickson.   Any Other Special Instructions Will Be Listed Below (If Applicable).  Your physician recommends that you weigh, daily, at the same time every day, and in the same amount of clothing. Please record your daily weights on the handout provided and bring it to your next appointment.   Low-Sodium Eating Plan Sodium raises blood pressure and causes water to be held in the body. Getting less sodium from food will help lower your blood pressure, reduce any swelling, and protect your heart, liver, and kidneys. We get sodium by adding salt (sodium chloride) to food. Most of our sodium comes from canned, boxed, and frozen foods. Restaurant foods, fast foods, and pizza are also very high in sodium. Even if you take medicine to lower your blood pressure or to reduce fluid in your body, getting less sodium from your food is important. What is my plan? Most people should limit their sodium intake to 2,300 mg a day. Your health care provider recommends that you limit your sodium intake to __2,000mg /2g__ a day. What do I need to know about this  eating plan? For the low-sodium eating plan, you will follow these general guidelines:  Choose foods with a % Daily Value for sodium of less than 5% (as listed on the food label).  Use salt-free seasonings or herbs instead of table salt or sea salt.  Check with your health care provider or pharmacist before using salt substitutes.  Eat fresh foods.  Eat more vegetables and fruits.  Limit canned vegetables. If you do use them, rinse them well to decrease the sodium.  Limit cheese to 1 oz (28 g) per day.  Eat lower-sodium products, often labeled as "lower sodium" or "no salt added."  Avoid foods that contain monosodium glutamate (MSG). MSG is sometimes added to [place] food and some canned foods.  Check food labels (Nutrition Facts labels) on foods to learn how much sodium is in one serving.  Eat more home-cooked food and less restaurant, buffet, and fast food.  When eating at a restaurant, ask that your food be prepared with less salt, or no salt if possible. How do I read food labels for sodium information? The Nutrition Facts label lists the amount of sodium in one serving of the food. If you eat more than one serving, you must multiply the listed amount of sodium by the number of servings. Food labels may also identify foods as:  Sodium free-Less than 5 mg in a serving.  Very low sodium-35 mg or less in a serving.  Low sodium-140 mg or less in a serving.  Light in sodium-50% less sodium in a serving. For example, if a food that usually has 300 mg of sodium  is changed to become light in sodium, it will have 150 mg of sodium.  Reduced sodium-25% less sodium in a serving. For example, if a food that usually has 400 mg of sodium is changed to reduced sodium, it will have 300 mg of sodium. What foods can I eat? Grains  Low-sodium cereals, including oats, puffed wheat and rice, and shredded wheat cereals. Low-sodium crackers. Unsalted rice and pasta. Lower-sodium  bread. Vegetables  Frozen or fresh vegetables. Low-sodium or reduced-sodium canned vegetables. Low-sodium or reduced-sodium tomato sauce and paste. Low-sodium or reduced-sodium tomato and vegetable juices. Fruits  Fresh, frozen, and canned fruit. Fruit juice. Meat and Other Protein Products  Low-sodium canned tuna and salmon. Fresh or frozen meat, poultry, seafood, and fish. Lamb. Unsalted nuts. Dried beans, peas, and lentils without added salt. Unsalted canned beans. Homemade soups without salt. Eggs. Dairy  Milk. Soy milk. Ricotta cheese. Low-sodium or reduced-sodium cheeses. Yogurt. Condiments  Fresh and dried herbs and spices. Salt-free seasonings. Onion and garlic powders. Low-sodium varieties of mustard and ketchup. Fresh or refrigerated horseradish. Lemon juice. Fats and Oils  Reduced-sodium salad dressings. Unsalted butter. Other  Unsalted popcorn and pretzels. The items listed above may not be a complete list of recommended foods or beverages. Contact your dietitian for more options.  What foods are not recommended? Grains  Instant hot cereals. Bread stuffing, pancake, and biscuit mixes. Croutons. Seasoned rice or pasta mixes. Noodle soup cups. Boxed or frozen macaroni and cheese. Self-rising flour. Regular salted crackers. Vegetables  Regular canned vegetables. Regular canned tomato sauce and paste. Regular tomato and vegetable juices. Frozen vegetables in sauces. Salted [place] fries. Olives. Angie Fava. Relishes. Sauerkraut. Salsa. Meat and Other Protein Products  Salted, canned, smoked, spiced, or pickled meats, seafood, or fish. Bacon, ham, sausage, hot dogs, corned beef, chipped beef, and packaged luncheon meats. Salt pork. Jerky. Pickled herring. Anchovies, regular canned tuna, and sardines. Salted nuts. Dairy  Processed cheese and cheese spreads. Cheese curds. Blue cheese and cottage cheese. Buttermilk. Condiments  Onion and garlic salt, seasoned salt, table salt, and sea salt.  Canned and packaged gravies. Worcestershire sauce. Tartar sauce. Barbecue sauce. Teriyaki sauce. Soy sauce, including reduced sodium. Steak sauce. Fish sauce. Oyster sauce. Cocktail sauce. Horseradish that you find on the shelf. Regular ketchup and mustard. Meat flavorings and tenderizers. Bouillon cubes. Hot sauce. Tabasco sauce. Marinades. Taco seasonings. Relishes. Fats and Oils  Regular salad dressings. Salted butter. Margarine. Ghee. Bacon fat. Other  Potato and tortilla chips. Corn chips and puffs. Salted popcorn and pretzels. Canned or dried soups. Pizza. Frozen entrees and pot pies. The items listed above may not be a complete list of foods and beverages to avoid. Contact your dietitian for more information.  This information is not intended to replace advice given to you by your health care provider. Make sure you discuss any questions you have with your health care provider. Document Released: 03/30/2002 Document Revised: 03/15/2016 Document Reviewed: 08/12/2013 Elsevier Interactive Patient Education  2017 [employer].     If you need a refill on your cardiac medications before your next appointment, please call your pharmacy.         Premature Ventricular Contraction A premature ventricular contraction (PVC) is a common irregularity in the normal heart rhythm. These contractions are extra heartbeats that start in the heart ventricles and occur too early in the normal sequence. During the PVC, the heart's normal electrical pathway is not used, so the beat is shorter and less effective. In most cases, these  contractions come and go and do not require treatment. What are the causes? In many cases, the cause may not be known. Common causes of the condition include:  Smoking.  Drinking alcohol.  Caffeine.  Certain medicines.  Some illegal drugs.  Stress. Certain medical conditions can also cause PVCs:  Changes in minerals in the blood (electrolytes).  Heart  failure.  Heart valve problems.  Low blood oxygen levels or high carbon dioxide levels.  Heart attack, or coronary artery disease. What are the signs or symptoms? The main symptom of this condition is a fast or skipped heartbeat (palpitations). Other symptoms include:  Chest pain.  Shortness of breath.  Feeling tired.  Dizziness. In some cases, there are no symptoms. How is this diagnosed? This condition may be diagnosed based on:  Your medical history.  A physical exam. During the exam, the health care provider will check for irregular heartbeats.  Tests, such as:  An ECG (electrocardiogram) to monitor the electrical activity of your heart.  Holter monitor testing. This involves wearing a device that clips to your clothing and monitors the electrical activity of your heart over longer periods of time.  Stress tests to see how exercise affects your heart rhythm and blood supply.  Echocardiogram. This test uses sound waves (ultrasound) to produce an image of your heart.  Electrophysiology study. This test checks the electric pathways in your heart. How is this treated? Treatment depends on any underlying conditions, the type of PVCs that you are having, and how much the symptoms are interfering with your daily life. Possible treatments include:  Avoiding things that can trigger the premature contractions, such as caffeine or alcohol.  Medicines. These may be given if symptoms are severe or if the extra heartbeats are frequent.  Treatment for any underlying condition that is found to be the cause of the contractions.  Catheter ablation. This procedure destroys the heart tissues that send abnormal signals. In some cases, no treatment is required. Follow these instructions at home: Lifestyle Follow these instructions as told by your health care provider:  Do not use any products that contain nicotine or tobacco, such as cigarettes and e-cigarettes. If you need help  quitting, ask your health care provider.  If caffeine triggers episodes of PVC, do not eat, drink, or use anything with caffeine in it.  If caffeine does not seem to trigger episodes, consume caffeine in moderation.  If alcohol triggers episodes of PVC, do not drink alcohol.  If alcohol does not seem to trigger episodes, limit alcohol intake to no more than 1 drink a day for nonpregnant women and 2 drinks a day for men. One drink equals 12 oz of beer, 5 oz of wine, or 1 oz of hard liquor.  Exercise regularly. Ask your health care provider what type of exercise is safe for you.  Find healthy ways to manage stress. Avoid stressful situations when possible.  Try to get at least 7-9 hours of sleep each night, or as much as recommended by your health care provider.  Do not use illegal drugs. General instructions  Take over-the-counter and prescription medicines only as told by your health care provider.  Keep all follow-up visits as told by your health care provider. This is important. Get help right away if:  You feel palpitations that are frequent or continual.  You have chest pain.  You have shortness of breath.  You have sweating for no reason.  You have nausea and vomiting.  You become light-headed  or you faint. This information is not intended to replace advice given to you by your health care provider. Make sure you discuss any questions you have with your health care provider. Document Released: 05/25/2004 Document Revised: 06/01/2016 Document Reviewed: 03/14/2016 Elsevier Interactive Patient Education  2017 [employer].

## 2016-10-26 NOTE — Progress Notes (Signed)
10/26/2016 Krista Mason   [birth year]  [id number]  Primary Physician Gerrit Heck, MD Primary Cardiologist: Dr. 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 She was evaluated by Dr. Trula Slade in 2014 for PAD. ABIs: R 0.94 and L 0.73. Medical Rx was recommended at that time.She is followed by Dr. Acie Fredrickson.   She was recently diagnosed with CHF. She saw her PCP, Dr. Rex Kras, given symptoms of exertional dyspnea and orthopnea. Per Pt report, she was evaluated with a CXR and was told that she had an enlarged heart and CHF. Dr. Rex Kras started her on PO lasix. She also recently had a chest CT in September 2017 that showed coronary artery calcification and atherosclerotic calcification in the wall of the thoracic aorta. Of note, she had a 2D echo in Jan. 2016 that showed normal LVEF of 60-65% with grade 1 DD, moderate AI and moderate MR.   She presented to our clinic for f/u on 10/24/15. I ordered a 2D echo to assess LVF and valve anatomy as well as a NST, given reports of occasional chest discomfort, suspected systolic dysfunction and evidence of coronary artery calcification on recent chest CT. Her 2D echo confirmed new systolic HF with reduction in EF down to 35-40% with diffuse hypokinesis and grade 2 DD. There was no significant change in her valves. Mild AI and moderate MR noted.   Her NST was a low risk study with no evidence for ischemia or infarction. It was noted in both the echo and stress test reports that she had frequent PVCs. She was also noted to have frequent PVCs on EKG during her office visit on 10/23/16.  She presents back to clinic today with her daughter and granddaughter. Her breathing has improved with lasix. No LEE. Sleeping has also improved. She was able to sleep on 1 pillow the other night (previously had to sleep in her recliner).  She had an office BMP 10/23/26 that showed stable renal function and K after starting lasix. Of note, she is currently on Cardizem 120 mg daily.     Current Meds  Medication Sig  . acetaminophen (TYLENOL) 325 MG tablet Take 325 mg by mouth every 6 (six) hours as needed. For pain  . aspirin 81 MG tablet Take 81 mg by mouth daily.  . cholecalciferol (VITAMIN D) 1000 UNITS tablet Take 2,000 Units by mouth daily.   [name] diltiazem (TIAZAC) 120 MG 24 hr capsule Take 1 capsule (120 mg total) by mouth daily.  . furosemide (LASIX) 40 MG tablet Take 20 mg by mouth daily.   . Imiquimod (ZYCLARA PUMP) 2.5 % CREA Apply topically as needed.  [name] losartan (COZAAR) 50 MG tablet Take 50 mg by mouth daily.  [name] lovastatin (MEVACOR) 20 MG tablet Take 20 mg by mouth.   . metFORMIN (GLUCOPHAGE) 500 MG tablet Take 500 mg by mouth 2 (two) times daily with a meal.  . metoprolol tartrate (LOPRESSOR) 25 MG tablet Take 0.5 tablets (12.5 mg total) by mouth 2 (two) times daily.  [name] omeprazole (PRILOSEC) 40 MG capsule TK 1 C PO QD  . ONE TOUCH ULTRA TEST test strip USE TO TEST YOUR BLOOD SUGAR ONCE DAILY UTD  . ONETOUCH DELICA LANCETS 17G MISC USE TO CHECK YOUR BLOOD SUGAR ONCE DAILY UTD  . Polyethyl Glycol-Propyl Glycol (SYSTANE OP) Apply 2 drops to eye  2 (two) times daily as needed. For dry eyes  . traMADol (ULTRAM) 50 MG tablet TK 1-2 TS PO Q 6-8 H PRF SEVERE PAIN   Allergies  Allergen Reactions  . Penicillins   . Ceclor [Cefaclor] Itching  . Crestor [Rosuvastatin Calcium] Nausea Only  . Epinephrine Other (See Comments)    "feels like heart is beating out of chest"  . Nsaids     Pt states she is not taking because of kidney function  . Other Other (See Comments)    FLU SHOT  . Percocet [Oxycodone-Acetaminophen] Nausea And Vomiting  . Prednisone     Dizziness, spiked blood sugar  . Lidocaine Hcl Palpitations   Past Medical History:  Diagnosis Date  . Atrial fibrillation ([place])   . Chronic kidney disease   .  Diabetes mellitus without complication ([place])   . Hx of echocardiogram    Echo (1/16):  EF 60-65%, no RWMA, Gr 1 DD, mod AI, mod MR, mild LAE  . Hyperkalemia   . Hyperlipidemia   . Hypertension   . Orthostatic hypotension   . PVC (premature ventricular contraction)   . Rheumatic fever   . Sleep apnea    Family History  Problem Relation Age of Onset  . Heart attack Father   . Heart disease Father   . Cancer Father   . Hypertension Father   . Hypertension Brother   . Stroke Brother   . Hypertension Brother   . Diabetes Son   . Hyperlipidemia Son   . Hypertension Son    Past Surgical History:  Procedure Laterality Date  . KNEE SURGERY Left 2009  . OVARIAN CYST REMOVAL     Social History   Social History  . Marital status: Widowed    Spouse name: N/A  . Number of children: N/A  . Years of education: N/A   Occupational History  . Not on file.   Social History Main Topics  . Smoking status: Current Every Day Smoker    Packs/day: 0.75    Years: 50.00  . Smokeless tobacco: Never Used     Comment: pt states " I'm thinking about it"  . Alcohol use No  . Drug use: No  . Sexual activity: Not on file   Other Topics Concern  . Not on file   Social History Narrative  . No narrative on file     Review of Systems: General: negative for chills, fever, night sweats or weight changes.  Cardiovascular: negative for chest pain, dyspnea on exertion, edema, orthopnea, palpitations, paroxysmal nocturnal dyspnea or shortness of breath Dermatological: negative for rash Respiratory: negative for cough or wheezing Urologic: negative for hematuria Abdominal: negative for nausea, vomiting, diarrhea, bright red blood per rectum, melena, or hematemesis Neurologic: negative for visual changes, syncope, or dizziness All other systems reviewed and are otherwise negative except as noted above.   Physical Exam:  Blood pressure 140/78, pulse (!) 58, height 5\' 6"  (1.676 m), weight 132 lb  12.8 oz (60.2 kg).  General appearance: alert, cooperative and no distress Neck: no carotid bruit and no JVD Lungs: clear to auscultation bilaterally Heart: regular rate and rhythm, S1, S2 normal, no murmur, click, rub or gallop Extremities: extremities normal, atraumatic, no cyanosis or edema Pulses: 2+ and symmetric Skin: Skin color, texture, turgor normal. No rashes or lesions Neurologic: Grossly normal  EKG not performed   2D Echo 10/24/16 Study Conclusions  - Left ventricle: Systolic function was moderately reduced. The   estimated ejection fraction  was in the range of 35% to 40%.   Diffuse hypokinesis. Features are consistent with a pseudonormal   left ventricular filling pattern, with concomitant abnormal   relaxation and increased filling pressure (grade 2 diastolic   dysfunction). - Aortic valve: There was mild regurgitation. - Mitral valve: There was moderate regurgitation directed   centrally. Valve area by pressure half-time: 2.47 cm^2. - Left atrium: The atrium was moderately dilated. - Pericardium, extracardiac: A small pericardial effusion was   identified circumferential to the heart. There was no evidence of   hemodynamic compromise.  Impressions:  - Incessant PVCs throughout the study. Compared to 2016 study,   there is marked reduction in LV function.  NST 10/24/2016 Study Highlights     There was no ST segment deviation noted during stress.  This is a low risk study.   1. No evidence for ischemia or infarction.  2. Ungated study.  3. Frequent PVCs.      ASSESSMENT AND PLAN:    1. Combined Systolic + Diastolic HF: volume and symptoms improved with Lasix. F/u BMP showed stable renal function and K. Continue lasix 20 mg daily. Continue daily weights. Pt instructed to restrict sodium and to notify our office if any weight gain, increased dyspnea or LEE.   2. Cardiomyopathy: new. EF now reduced down to 35-40%, previously 60-65% in 2016. NST 10/23/16  was negative for ischemia and infarction. She has frequent PVCs. Suspect tachy medicated cardiomyopathy. We will order a 48 hr Holter monitor to assess PVC burden and will place referral to see EP. In the meantime, we will discontinue Cardizem given reduced EF and will increase her metoprolol to 25 mg BID. Based on monitor findings, EP will determine continued management with beta blockers, vs initiation of an antiarrythmic vs PVC ablation. We will see how she responds to higher dose of metoprolol. If ok on higher dose, can transition to metoprolol XL, given LV dysfunction.   3. PVCs: 48 hr Holter to assess ectopic burden>> EP referral. If high burden, check TSH and Mg level. K is WNL. We discussed avoidance of caffeine. Continue BB. Metoprolol increased to 25 mg BID today.   4. Valvular Disease: no significant change when compared to prior echo in 2016. She has  Mild AI and moderate MR.   Lyda Jester PA-C 10/26/2016 2:56 PM

## 2016-10-29 ENCOUNTER — Encounter (HOSPITAL_COMMUNITY): Payer: Medicare HMO

## 2016-10-29 ENCOUNTER — Ambulatory Visit: Payer: Medicare HMO | Admitting: Family

## 2016-11-01 ENCOUNTER — Encounter: Payer: Self-pay | Admitting: Cardiology

## 2016-11-01 ENCOUNTER — Ambulatory Visit: Payer: Medicare HMO | Admitting: Cardiology

## 2016-11-01 ENCOUNTER — Ambulatory Visit (INDEPENDENT_AMBULATORY_CARE_PROVIDER_SITE_OTHER): Payer: Medicare HMO | Admitting: Cardiology

## 2016-11-01 VITALS — BP 132/80 | HR 88 | Ht 66.0 in | Wt 132.8 lb

## 2016-11-01 DIAGNOSIS — I493 Ventricular premature depolarization: Secondary | ICD-10-CM | POA: Diagnosis not present

## 2016-11-01 MED ORDER — AMIODARONE HCL 200 MG PO TABS: ORAL_TABLET | ORAL | 0 refills | Status: DC

## 2016-11-01 MED ORDER — AMIODARONE HCL 200 MG PO TABS: 200.0000 mg | ORAL_TABLET | Freq: Every day | ORAL | 2 refills | Status: DC

## 2016-11-01 MED ORDER — ATORVASTATIN CALCIUM 20 MG PO TABS: 20.0000 mg | ORAL_TABLET | Freq: Every day | ORAL | 4 refills | Status: DC

## 2016-11-01 NOTE — Progress Notes (Signed)
Electrophysiology Office Note   Date:  11/01/2016   ID:  Krista Mason, DOB [birth year], MRN [id number]  PCP:  Gerrit Heck, MD  Cardiologist:  [place] Primary Electrophysiologist:  Will [name], MD    Chief Complaint  Patient presents with  . Follow-up    PVC's     History of Present Illness: Krista Mason is a 81 y.o. female who presents today for electrophysiology evaluation.   Hx of HTN, PVCs, HLD, DM2, CKD, histoplasmosis, and tobacco abuse. Recently diagnosed with CHF. Myoview without ischemia. Holter with 29% PVCs. She has been having episodes of fatigue and shortness of breath.  That being said, her SOB has improved since starting her lasix. She is sleeping better but does still complain of palpitations. She previously had to sleep in a chair but was able to sleep on one pillow recently.    Today, she denies symptoms of chest pain, lower extremity edema, claudication, dizziness, presyncope, syncope, bleeding, or neurologic sequela. The patient is tolerating medications without difficulties and is otherwise without complaint today.    Past Medical History:  Diagnosis Date  . Atrial fibrillation ([place])   . Chronic kidney disease   . Diabetes mellitus without complication ([place])   . Hx of echocardiogram    Echo (1/16):  EF 60-65%, no RWMA, Gr 1 DD, mod AI, mod MR, mild LAE  . Hyperkalemia   . Hyperlipidemia   . Hypertension   . Orthostatic hypotension   . PVC (premature ventricular contraction)   . Rheumatic fever   . Sleep apnea    Past Surgical History:  Procedure Laterality Date  . KNEE SURGERY Left 2009  . OVARIAN CYST REMOVAL       Current Outpatient Prescriptions  Medication Sig Dispense Refill  . acetaminophen (TYLENOL) 325 MG tablet Take 325 mg by mouth every 6 (six) hours as needed. For pain    . aspirin 81 MG tablet Take 81 mg by mouth daily.    . cholecalciferol (VITAMIN D) 1000 UNITS tablet Take 1,000 Units by mouth daily.     .  furosemide (LASIX) 40 MG tablet Take 20 mg by mouth daily.     [name] losartan (COZAAR) 50 MG tablet Take 50 mg by mouth daily.  1  . metFORMIN (GLUCOPHAGE) 500 MG tablet Take 500 mg by mouth 2 (two) times daily with a meal.    . metoprolol tartrate (LOPRESSOR) 25 MG tablet Take 1 tablet (25 mg total) by mouth 2 (two) times daily. 60 tablet 11  . omeprazole (PRILOSEC) 40 MG capsule TK 1 C PO QD  4  . ONE TOUCH ULTRA TEST test strip USE TO TEST YOUR BLOOD SUGAR ONCE DAILY UTD  5  . ONETOUCH DELICA LANCETS 92J MISC USE TO CHECK YOUR BLOOD SUGAR ONCE DAILY UTD  5  . Polyethyl Glycol-Propyl Glycol (SYSTANE OP) Apply 2 drops to eye 2 (two) times daily as needed. For dry eyes    . traMADol (ULTRAM) 50 MG tablet TK 1-2 TS PO Q 6-8 H PRF SEVERE PAIN  0   No current facility-administered medications for this visit.     Allergies:   Penicillins; Ceclor [cefaclor]; Crestor [rosuvastatin calcium]; Epinephrine; Nsaids; Other; Percocet [oxycodone-acetaminophen]; Prednisone; and Lidocaine hcl   Social History:  The patient  reports that she has been smoking.  She has a 37.50 pack-year smoking history. She has never used smokeless tobacco. She reports that she does not drink alcohol or use drugs.   Family History:  The patient's family history includes Cancer in her father; Diabetes in her son; Heart attack in her father; Heart disease in her father; Hyperlipidemia in her son; Hypertension in her brother, brother, father, and son; Stroke in her brother.    ROS:  Please see the history of present illness.   Otherwise, review of systems is positive for fatigue, chest pressure, palpitations, chest pain, SOB, cough, back pain, dizziness.   All other systems are reviewed and negative.    PHYSICAL EXAM: VS:  BP 132/80   Pulse 88   Ht 5\' 6"  (1.676 m)   Wt 132 lb 12.8 oz (60.2 kg)   BMI 21.43 kg/m  , BMI Body mass index is 21.43 kg/m. GEN: Well nourished, well developed, in no acute distress  HEENT: normal    Neck: no JVD, carotid bruits, or masses Cardiac: RRR; no murmurs, rubs, or gallops,no edema  Respiratory:  clear to auscultation bilaterally, normal work of breathing GI: soft, nontender, nondistended, + BS MS: no deformity or atrophy  Skin: warm and dry Neuro:  Strength and sensation are intact Psych: euthymic mood, full affect  EKG:  EKG is not ordered today. Personal review of the ekg ordered shows sinus rhythm, frequent PVCs  Recent Labs: 10/23/2016: BNP CANCELED; BUN 23; Creatinine, Ser 1.24; NT-Pro BNP 4,005; Platelets WILL FOLLOW; Potassium 4.2; Sodium 137    Lipid Panel  No results found for: CHOL, TRIG, HDL, CHOLHDL, VLDL, LDLCALC, LDLDIRECT   Wt Readings from Last 3 Encounters:  11/01/16 132 lb 12.8 oz (60.2 kg)  10/26/16 132 lb 12.8 oz (60.2 kg)  10/24/16 132 lb (59.9 kg)      Other studies Reviewed: Additional studies/ records that were reviewed today include: TTE 10/24/16, SPECT 10/24/16, Holter 10/30/16  Review of the above records today demonstrates:  - Left ventricle: Systolic function was moderately reduced. The   estimated ejection fraction was in the range of 35% to 40%.   Diffuse hypokinesis. Features are consistent with a pseudonormal   left ventricular filling pattern, with concomitant abnormal   relaxation and increased filling pressure (grade 2 diastolic   dysfunction). - Aortic valve: There was mild regurgitation. - Mitral valve: There was moderate regurgitation directed   centrally. Valve area by pressure half-time: 2.47 cm^2. - Left atrium: The atrium was moderately dilated. - Pericardium, extracardiac: A small pericardial effusion was   identified circumferential to the heart. There was no evidence of   hemodynamic compromise.   There was no ST segment deviation noted during stress.  This is a low risk study.   1. No evidence for ischemia or infarction.  2. Ungated study.  3. Frequent PVCs.    NSR  Frequent PVCs  Her PVC burden is  29%   ASSESSMENT AND PLAN:  1.  PVCs: unfortunately, it appears that her PVCs are multifocal. Some of them do appear to be coming  From the outflow tracts, and would be amenable to ablation. That being said, she would ather try medical management prior to Ablation. We will start her on amiodarone.I did give her the option of admission to the hospital with initiation of sotalol, but she wished to try amiodarone first.  2. Nonischemic cardiomyopathy: Likely due to her elevated PVC burden. She is on optimal medical therapy with metoprolol and an ARB. Hopefully decreasing her PVC  Burden with amiodarone will help to improve her ejection fraction.  3. Hyperlipidemia: She is on lovastatin which has an interaction with amiodarone. We'll switch her to Lipitor.  Current medicines are reviewed at length with the patient today.   The patient does not have concerns regarding her medicines.  The following changes were made today:  Amiodarone, stop lovastatin start atorvastatin  Labs/ tests ordered today include:  No orders of the defined types were placed in this encounter.    Disposition:   FU with Will Camnitz 3 months  Signed, Will [name], MD  11/01/2016 5:04 PM     [place] [address] [place] [address] [phone] (office) [phone] (fax)

## 2016-11-01 NOTE — Patient Instructions (Addendum)
Medication Instructions:    Your physician has recommended you make the following change in your medication:  1) STOP Lovastatin 2) START Lipitor 20 mg once daily -- start in 1-2 weeks 2) START Amiodarone  - take 400 mg twice a day for 14 days, then  - take 400 mg once a day for 14 days, then  - take 200 mg once daily  --- If you need a refill on your cardiac medications before your next appointment, please call your pharmacy. ---  Labwork:  None ordered  Testing/Procedures:  None ordered  Follow-Up:  Your physician recommends that you schedule a follow-up appointment in: 3 months with Dr. Curt Bears.  Thank you for choosing [facility]!!   Krista Curet, RN [phone]    Any Other Special Instructions Will Be Listed Below (If Applicable).  Amiodarone tablets What is this medicine? AMIODARONE (a MEE oh da rone) is an antiarrhythmic drug. It helps make your heart beat regularly. Because of the side effects caused by this medicine, it is only used when other medicines have not worked. It is usually used for heartbeat problems that may be life threatening. This medicine may be used for other purposes; ask your health care provider or pharmacist if you have questions. COMMON BRAND NAME(S): Cordarone, Pacerone What should I tell my health care provider before I take this medicine? They need to know if you have any of these conditions: -liver disease -lung disease -other heart problems -thyroid disease -an unusual or allergic reaction to amiodarone, iodine, other medicines, foods, dyes, or preservatives -pregnant or trying to get pregnant -breast-feeding How should I use this medicine? Take this medicine by mouth with a glass of water. Follow the directions on the prescription label. You can take this medicine with or without food. However, you should always take it the same way each time. Take your doses at regular intervals. Do not take your medicine more often than  directed. Do not stop taking except on the advice of your doctor or health care professional. A special MedGuide will be given to you by the pharmacist with each prescription and refill. Be sure to read this information carefully each time. Talk to your pediatrician regarding the use of this medicine in children. Special care may be needed. Overdosage: If you think you have taken too much of this medicine contact a poison control center or emergency room at once. NOTE: This medicine is only for you. Do not share this medicine with others. What if I miss a dose? If you miss a dose, take it as soon as you can. If it is almost time for your next dose, take only that dose. Do not take double or extra doses. What may interact with this medicine? Do not take this medicine with any of the following medications: -abarelix -apomorphine -arsenic trioxide -certain antibiotics like erythromycin, gemifloxacin, levofloxacin, pentamidine -certain medicines for depression like amoxapine, tricyclic antidepressants -certain medicines for fungal infections like fluconazole, itraconazole, ketoconazole, posaconazole, voriconazole -certain medicines for irregular heart beat like disopyramide, dofetilide, dronedarone, ibutilide, propafenone, sotalol -certain medicines for malaria like chloroquine, halofantrine -cisapride -droperidol -haloperidol -hawthorn -maprotiline -methadone -phenothiazines like chlorpromazine, mesoridazine, thioridazine -pimozide -ranolazine -red yeast rice -vardenafil -ziprasidone This medicine may also interact with the following medications: -antiviral medicines for HIV or AIDS -certain medicines for blood pressure, heart disease, irregular heart beat -certain medicines for cholesterol like atorvastatin, cerivastatin, lovastatin, simvastatin -certain medicines for hepatitis C like sofosbuvir and ledipasvir; sofosbuvir -certain medicines for seizures like phenytoin -  certain  medicines for thyroid problems -certain medicines that treat or prevent blood clots like warfarin -cholestyramine -cimetidine -clopidogrel -cyclosporine -dextromethorphan -diuretics -fentanyl -general anesthetics -grapefruit juice -lidocaine -loratadine -methotrexate -other medicines that prolong the QT interval (cause an abnormal heart rhythm) -procainamide -quinidine -rifabutin, rifampin, or rifapentine -St. John's Wort -trazodone This list may not describe all possible interactions. Give your health care provider a list of all the medicines, herbs, non-prescription drugs, or dietary supplements you use. Also tell them if you smoke, drink alcohol, or use illegal drugs. Some items may interact with your medicine. What should I watch for while using this medicine? Your condition will be monitored closely when you first begin therapy. Often, this drug is first started in a hospital or other monitored health care setting. Once you are on maintenance therapy, visit your doctor or health care professional for regular checks on your progress. Because your condition and use of this medicine carry some risk, it is a good idea to carry an identification card, necklace or bracelet with details of your condition, medications, and doctor or health care professional. Krista Mason may get drowsy or dizzy. Do not drive, use machinery, or do anything that needs mental alertness until you know how this medicine affects you. Do not stand or sit up quickly, especially if you are an older patient. This reduces the risk of dizzy or fainting spells. This medicine can make you more sensitive to the sun. Keep out of the sun. If you cannot avoid being in the sun, wear protective clothing and use sunscreen. Do not use sun lamps or tanning beds/booths. You should have regular eye exams before and during treatment. Call your doctor if you have blurred vision, see halos, or your eyes become sensitive to light. Your eyes may get  dry. It may be helpful to use a lubricating eye solution or artificial tears solution. If you are going to have surgery or a procedure that requires contrast dyes, tell your doctor or health care professional that you are taking this medicine. What side effects may I notice from receiving this medicine? Side effects that you should report to your doctor or health care professional as soon as possible: -allergic reactions like skin rash, itching or hives, swelling of the face, lips, or tongue -blue-gray coloring of the skin -blurred vision, seeing blue green halos, increased sensitivity of the eyes to light -breathing problems -chest pain -dark urine -fast, irregular heartbeat -feeling faint or light-headed -intolerance to heat or cold -nausea or vomiting -pain and swelling of the scrotum -pain, tingling, numbness in feet, hands -redness, blistering, peeling or loosening of the skin, including inside the mouth -spitting up blood -stomach pain -sweating -unusual or uncontrolled movements of body -unusually weak or tired -weight gain or loss -yellowing of the eyes or skin Side effects that usually do not require medical attention (report to your doctor or health care professional if they continue or are bothersome): -change in sex drive or performance -constipation -dizziness -headache -loss of appetite -trouble sleeping This list may not describe all possible side effects. Call your doctor for medical advice about side effects. You may report side effects to FDA at 1-800-FDA-1088. Where should I keep my medicine? Keep out of the reach of children. Store at room temperature between 20 and 25 degrees C (68 and 77 degrees F). Protect from light. Keep container tightly closed. Throw away any unused medicine after the expiration date. NOTE: This sheet is a summary. It may not cover all possible information.  If you have questions about this medicine, talk to your doctor, pharmacist, or  health care provider.  2017 Elsevier/Gold Standard (2014-01-11 19:48:11)

## 2016-11-05 ENCOUNTER — Encounter (HOSPITAL_COMMUNITY): Payer: Self-pay | Admitting: Emergency Medicine

## 2016-11-05 ENCOUNTER — Emergency Department (HOSPITAL_COMMUNITY): Payer: Medicare HMO

## 2016-11-05 ENCOUNTER — Other Ambulatory Visit: Payer: Self-pay

## 2016-11-05 ENCOUNTER — Inpatient Hospital Stay (HOSPITAL_COMMUNITY)
Admission: EM | Admit: 2016-11-05 | Discharge: 2016-11-09 | DRG: 291 | Disposition: A | Discharge status: Home Health | Payer: Medicare HMO | Attending: Internal Medicine | Admitting: Internal Medicine

## 2016-11-05 ENCOUNTER — Inpatient Hospital Stay (HOSPITAL_COMMUNITY): Payer: Medicare HMO

## 2016-11-05 DIAGNOSIS — R0682 Tachypnea, not elsewhere classified: Secondary | ICD-10-CM | POA: Diagnosis not present

## 2016-11-05 DIAGNOSIS — Z823 Family history of stroke: Secondary | ICD-10-CM | POA: Diagnosis not present

## 2016-11-05 DIAGNOSIS — Z7984 Long term (current) use of oral hypoglycemic drugs: Secondary | ICD-10-CM | POA: Diagnosis not present

## 2016-11-05 DIAGNOSIS — R0602 Shortness of breath: Secondary | ICD-10-CM

## 2016-11-05 DIAGNOSIS — E119 Type 2 diabetes mellitus without complications: Secondary | ICD-10-CM

## 2016-11-05 DIAGNOSIS — Z720 Tobacco use: Secondary | ICD-10-CM | POA: Diagnosis present

## 2016-11-05 DIAGNOSIS — N189 Chronic kidney disease, unspecified: Secondary | ICD-10-CM | POA: Diagnosis not present

## 2016-11-05 DIAGNOSIS — I472 Ventricular tachycardia: Secondary | ICD-10-CM | POA: Diagnosis present

## 2016-11-05 DIAGNOSIS — I159 Secondary hypertension, unspecified: Secondary | ICD-10-CM

## 2016-11-05 DIAGNOSIS — Z8249 Family history of ischemic heart disease and other diseases of the circulatory system: Secondary | ICD-10-CM

## 2016-11-05 DIAGNOSIS — N179 Acute kidney failure, unspecified: Secondary | ICD-10-CM | POA: Diagnosis present

## 2016-11-05 DIAGNOSIS — D509 Iron deficiency anemia, unspecified: Secondary | ICD-10-CM | POA: Diagnosis not present

## 2016-11-05 DIAGNOSIS — E784 Other hyperlipidemia: Secondary | ICD-10-CM | POA: Diagnosis not present

## 2016-11-05 DIAGNOSIS — J9601 Acute respiratory failure with hypoxia: Secondary | ICD-10-CM | POA: Diagnosis present

## 2016-11-05 DIAGNOSIS — I4891 Unspecified atrial fibrillation: Secondary | ICD-10-CM | POA: Diagnosis present

## 2016-11-05 DIAGNOSIS — I340 Nonrheumatic mitral (valve) insufficiency: Secondary | ICD-10-CM | POA: Diagnosis not present

## 2016-11-05 DIAGNOSIS — E785 Hyperlipidemia, unspecified: Secondary | ICD-10-CM | POA: Diagnosis present

## 2016-11-05 DIAGNOSIS — N183 Chronic kidney disease, stage 3 unspecified: Secondary | ICD-10-CM | POA: Diagnosis present

## 2016-11-05 DIAGNOSIS — F17210 Nicotine dependence, cigarettes, uncomplicated: Secondary | ICD-10-CM | POA: Diagnosis present

## 2016-11-05 DIAGNOSIS — E1122 Type 2 diabetes mellitus with diabetic chronic kidney disease: Secondary | ICD-10-CM | POA: Diagnosis present

## 2016-11-05 DIAGNOSIS — E1165 Type 2 diabetes mellitus with hyperglycemia: Secondary | ICD-10-CM | POA: Diagnosis not present

## 2016-11-05 DIAGNOSIS — I5043 Acute on chronic combined systolic (congestive) and diastolic (congestive) heart failure: Secondary | ICD-10-CM

## 2016-11-05 DIAGNOSIS — Z7982 Long term (current) use of aspirin: Secondary | ICD-10-CM

## 2016-11-05 DIAGNOSIS — I493 Ventricular premature depolarization: Secondary | ICD-10-CM | POA: Diagnosis not present

## 2016-11-05 DIAGNOSIS — G4730 Sleep apnea, unspecified: Secondary | ICD-10-CM | POA: Diagnosis present

## 2016-11-05 DIAGNOSIS — R69 Illness, unspecified: Secondary | ICD-10-CM | POA: Diagnosis not present

## 2016-11-05 DIAGNOSIS — I501 Left ventricular failure: Secondary | ICD-10-CM | POA: Diagnosis not present

## 2016-11-05 DIAGNOSIS — E1169 Type 2 diabetes mellitus with other specified complication: Secondary | ICD-10-CM | POA: Insufficient documentation

## 2016-11-05 DIAGNOSIS — I10 Essential (primary) hypertension: Secondary | ICD-10-CM | POA: Diagnosis not present

## 2016-11-05 DIAGNOSIS — I080 Rheumatic disorders of both mitral and aortic valves: Secondary | ICD-10-CM | POA: Diagnosis present

## 2016-11-05 DIAGNOSIS — I5021 Acute systolic (congestive) heart failure: Secondary | ICD-10-CM

## 2016-11-05 DIAGNOSIS — N184 Chronic kidney disease, stage 4 (severe): Secondary | ICD-10-CM | POA: Diagnosis present

## 2016-11-05 DIAGNOSIS — Z79899 Other long term (current) drug therapy: Secondary | ICD-10-CM

## 2016-11-05 DIAGNOSIS — I130 Hypertensive heart and chronic kidney disease with heart failure and stage 1 through stage 4 chronic kidney disease, or unspecified chronic kidney disease: Principal | ICD-10-CM | POA: Diagnosis present

## 2016-11-05 DIAGNOSIS — J811 Chronic pulmonary edema: Secondary | ICD-10-CM | POA: Diagnosis not present

## 2016-11-05 DIAGNOSIS — Z833 Family history of diabetes mellitus: Secondary | ICD-10-CM | POA: Diagnosis not present

## 2016-11-05 DIAGNOSIS — E118 Type 2 diabetes mellitus with unspecified complications: Secondary | ICD-10-CM | POA: Insufficient documentation

## 2016-11-05 DIAGNOSIS — I429 Cardiomyopathy, unspecified: Secondary | ICD-10-CM | POA: Diagnosis present

## 2016-11-05 DIAGNOSIS — I440 Atrioventricular block, first degree: Secondary | ICD-10-CM | POA: Diagnosis present

## 2016-11-05 DIAGNOSIS — E782 Mixed hyperlipidemia: Secondary | ICD-10-CM | POA: Diagnosis present

## 2016-11-05 LAB — CBC
HCT: 31.9 % — ABNORMAL LOW (ref 36.0–46.0)
Hemoglobin: 9.8 g/dL — ABNORMAL LOW (ref 12.0–15.0)
MCH: 23.6 pg — ABNORMAL LOW (ref 26.0–34.0)
MCHC: 30.7 g/dL (ref 30.0–36.0)
MCV: 76.7 fL — ABNORMAL LOW (ref 78.0–100.0)
PLATELETS: 309 10*3/uL (ref 150–400)
RBC: 4.16 MIL/uL (ref 3.87–5.11)
RDW: 16.3 % — AB (ref 11.5–15.5)
WBC: 10.6 10*3/uL — AB (ref 4.0–10.5)

## 2016-11-05 LAB — I-STAT TROPONIN, ED: TROPONIN I, POC: 0.01 ng/mL (ref 0.00–0.08)

## 2016-11-05 LAB — FOLATE: Folate: 20 ng/mL (ref >5.9)

## 2016-11-05 LAB — TSH: TSH: 2.077 u[IU]/mL (ref 0.350–4.500)

## 2016-11-05 LAB — BASIC METABOLIC PANEL
Anion gap: 11 (ref 5–15)
BUN: 30 mg/dL — AB (ref 6–20)
CALCIUM: 9.5 mg/dL (ref 8.9–10.3)
CO2: 20 mmol/L — ABNORMAL LOW (ref 22–32)
CREATININE: 1.36 mg/dL — AB (ref 0.44–1.00)
Chloride: 100 mmol/L — ABNORMAL LOW (ref 101–111)
GFR calc Af Amer: 42 mL/min — ABNORMAL LOW (ref >60)
GFR, EST NON AFRICAN AMERICAN: 36 mL/min — AB (ref >60)
Glucose, Bld: 253 mg/dL — ABNORMAL HIGH (ref 65–99)
Potassium: 4.2 mmol/L (ref 3.5–5.1)
SODIUM: 131 mmol/L — AB (ref 135–145)

## 2016-11-05 LAB — CBG MONITORING, ED
GLUCOSE-CAPILLARY: 120 mg/dL — AB (ref 65–99)
Glucose-Capillary: 114 mg/dL — ABNORMAL HIGH (ref 65–99)

## 2016-11-05 LAB — RETICULOCYTES
RBC.: 3.76 MIL/uL — AB (ref 3.87–5.11)
Retic Count, Absolute: 26.3 10*3/uL (ref 19.0–186.0)
Retic Ct Pct: 0.7 % (ref 0.4–3.1)

## 2016-11-05 LAB — GLUCOSE, CAPILLARY
GLUCOSE-CAPILLARY: 98 mg/dL (ref 65–99)
Glucose-Capillary: 102 mg/dL — ABNORMAL HIGH (ref 65–99)

## 2016-11-05 LAB — FERRITIN: Ferritin: 8 ng/mL — ABNORMAL LOW (ref 11–307)

## 2016-11-05 LAB — BRAIN NATRIURETIC PEPTIDE: B NATRIURETIC PEPTIDE 5: 1409.7 pg/mL — AB (ref 0.0–100.0)

## 2016-11-05 LAB — IRON AND TIBC
Iron: 10 ug/dL — ABNORMAL LOW (ref 28–170)
SATURATION RATIOS: 2 % — AB (ref 10.4–31.8)
TIBC: 489 ug/dL — AB (ref 250–450)
UIBC: 479 ug/dL

## 2016-11-05 LAB — TROPONIN I: Troponin I: <0.03 ng/mL (ref <0.03)

## 2016-11-05 LAB — VITAMIN B12: Vitamin B-12: 330 pg/mL (ref 180–914)

## 2016-11-05 MED ORDER — ORAL CARE MOUTH RINSE
15.0000 mL | Freq: Two times a day (BID) | OROMUCOSAL | Status: DC
  Administered 2016-11-05 – 2016-11-09 (×8): 15 mL via OROMUCOSAL

## 2016-11-05 MED ORDER — GUAIFENESIN-DM 100-10 MG/5ML PO SYRP
5.0000 mL | ORAL_SOLUTION | ORAL | Status: DC | PRN
  Administered 2016-11-07 – 2016-11-08 (×2): 5 mL via ORAL
  Filled 2016-11-05 (×2): qty 5

## 2016-11-05 MED ORDER — POLYETHYL GLYCOL-PROPYL GLYCOL 0.4-0.3 % OP GEL: Freq: Two times a day (BID) | OPHTHALMIC | Status: DC | PRN

## 2016-11-05 MED ORDER — SODIUM CHLORIDE 0.9 % IV SOLN: 250.0000 mL | INTRAVENOUS | Status: DC | PRN

## 2016-11-05 MED ORDER — ENSURE ENLIVE PO LIQD
237.0000 mL | Freq: Two times a day (BID) | ORAL | Status: DC
  Administered 2016-11-05 – 2016-11-06 (×2): 237 mL via ORAL

## 2016-11-05 MED ORDER — INSULIN ASPART 100 UNIT/ML ~~LOC~~ SOLN: 0.0000 [IU] | Freq: Every day | SUBCUTANEOUS | Status: DC

## 2016-11-05 MED ORDER — PANTOPRAZOLE SODIUM 40 MG PO TBEC
40.0000 mg | DELAYED_RELEASE_TABLET | Freq: Every day | ORAL | Status: DC
  Administered 2016-11-05 – 2016-11-08 (×4): 40 mg via ORAL
  Filled 2016-11-05 (×4): qty 1

## 2016-11-05 MED ORDER — ACETAMINOPHEN 325 MG PO TABS
650.0000 mg | ORAL_TABLET | ORAL | Status: DC | PRN
  Administered 2016-11-07: 325 mg via ORAL
  Filled 2016-11-05: qty 2

## 2016-11-05 MED ORDER — NITROGLYCERIN IN D5W 200-5 MCG/ML-% IV SOLN
0.0000 ug/min | Freq: Once | INTRAVENOUS | Status: AC
  Administered 2016-11-05: 5 ug/min via INTRAVENOUS
  Filled 2016-11-05: qty 250

## 2016-11-05 MED ORDER — TRAMADOL HCL 50 MG PO TABS: 50.0000 mg | ORAL_TABLET | Freq: Four times a day (QID) | ORAL | Status: DC | PRN

## 2016-11-05 MED ORDER — FUROSEMIDE 10 MG/ML IJ SOLN
40.0000 mg | Freq: Once | INTRAMUSCULAR | Status: AC
  Administered 2016-11-05: 40 mg via INTRAVENOUS
  Filled 2016-11-05: qty 4

## 2016-11-05 MED ORDER — ASPIRIN 81 MG PO CHEW
81.0000 mg | CHEWABLE_TABLET | Freq: Every day | ORAL | Status: DC
  Administered 2016-11-05 – 2016-11-09 (×5): 81 mg via ORAL
  Filled 2016-11-05 (×4): qty 1

## 2016-11-05 MED ORDER — ASPIRIN 81 MG PO TABS: 81.0000 mg | ORAL_TABLET | Freq: Every day | ORAL | Status: DC

## 2016-11-05 MED ORDER — AMIODARONE HCL 200 MG PO TABS
400.0000 mg | ORAL_TABLET | Freq: Two times a day (BID) | ORAL | Status: DC
  Administered 2016-11-05: 400 mg via ORAL
  Filled 2016-11-05: qty 2

## 2016-11-05 MED ORDER — INSULIN ASPART 100 UNIT/ML ~~LOC~~ SOLN
0.0000 [IU] | Freq: Three times a day (TID) | SUBCUTANEOUS | Status: DC
  Administered 2016-11-07 – 2016-11-08 (×2): 1 [IU] via SUBCUTANEOUS
  Administered 2016-11-09: 2 [IU] via SUBCUTANEOUS

## 2016-11-05 MED ORDER — SODIUM CHLORIDE 0.9% FLUSH: 3.0000 mL | INTRAVENOUS | Status: DC | PRN

## 2016-11-05 MED ORDER — ATORVASTATIN CALCIUM 20 MG PO TABS
20.0000 mg | ORAL_TABLET | Freq: Every day | ORAL | Status: DC
  Filled 2016-11-05 (×4): qty 1

## 2016-11-05 MED ORDER — SODIUM CHLORIDE 0.9% FLUSH
3.0000 mL | Freq: Two times a day (BID) | INTRAVENOUS | Status: DC
  Administered 2016-11-05 – 2016-11-09 (×8): 3 mL via INTRAVENOUS

## 2016-11-05 MED ORDER — ENOXAPARIN SODIUM 30 MG/0.3ML ~~LOC~~ SOLN
30.0000 mg | SUBCUTANEOUS | Status: DC
  Administered 2016-11-05 – 2016-11-08 (×4): 30 mg via SUBCUTANEOUS
  Filled 2016-11-05 (×4): qty 0.3

## 2016-11-05 MED ORDER — POLYVINYL ALCOHOL 1.4 % OP SOLN: 1.0000 [drp] | Freq: Two times a day (BID) | OPHTHALMIC | Status: DC | PRN

## 2016-11-05 MED ORDER — FUROSEMIDE 10 MG/ML IJ SOLN
40.0000 mg | Freq: Two times a day (BID) | INTRAMUSCULAR | Status: DC
  Administered 2016-11-05 – 2016-11-09 (×8): 40 mg via INTRAVENOUS
  Filled 2016-11-05 (×8): qty 4

## 2016-11-05 MED ORDER — LOSARTAN POTASSIUM 50 MG PO TABS
50.0000 mg | ORAL_TABLET | Freq: Every day | ORAL | Status: DC
  Administered 2016-11-05 – 2016-11-09 (×5): 50 mg via ORAL
  Filled 2016-11-05 (×4): qty 1

## 2016-11-05 MED ORDER — ONDANSETRON HCL 4 MG/2ML IJ SOLN: 4.0000 mg | Freq: Four times a day (QID) | INTRAMUSCULAR | Status: DC | PRN

## 2016-11-05 MED ORDER — METOPROLOL TARTRATE 25 MG PO TABS: 25.0000 mg | ORAL_TABLET | Freq: Two times a day (BID) | ORAL | Status: DC

## 2016-11-05 NOTE — ED Notes (Signed)
Per admitting NP, discontinue bipap and nitro drip, continue to monitor. Pt a/o x4 at this time, NAD, reports she feels much better than on arrival. On 4L nasal cannula, sats 98%, RR unlabored.

## 2016-11-05 NOTE — Progress Notes (Signed)
Patient continues to complain of shortness of breath with any exertion, remains on 2 liters.

## 2016-11-05 NOTE — Care Management Note (Signed)
Case Management Note  Patient Details  Name: Krista Mason MRN: [id number] Date of Birth: Apr 17, 1936  Subjective/Objective:                  From home. /80 y.o. female, with hx of DM, MAT, CHF, HTN, brought in by ambulance, who presents to the Emergency Department for sudden onset, worsening shortness of breath that started just prior to arrival.   Action/Plan: Follow for disposition needs. / Admit status INPATIENT (Flash pulm edema w/ resp failure); anticipate discharge [place].    Expected Discharge Date:  11/08/16               Expected Discharge Plan:  [place]  In-House Referral:  NA  Discharge planning Services  CM Consult   Status of Service:  In process, will continue to follow  If discussed at [place] Length of Stay Meetings, dates discussed:    Additional Comments:  Fuller Mandril, RN 11/05/2016, 11:50 AM

## 2016-11-05 NOTE — Progress Notes (Signed)
Subjective: Feeling much better off IV NTG and BiPap  Objective: Vital signs in last 24 hours: Temp:  [97.8 F (36.6 C)] 97.8 F (36.6 C) (01/15 0246) Pulse Rate:  [39-82] 74 (01/15 1140) Resp:  [11-28] 16 (01/15 1140) BP: (111-181)/(50-100) 150/75 (01/15 1140) SpO2:  [98 %-100 %] 99 % (01/15 1140) Weight:  [132 lb (59.9 kg)] 132 lb (59.9 kg) (01/15 0247) Weight change:    Intake/Output from previous day: No intake/output data recorded. Intake/Output this shift: Total I/O In: -  Out: 300 [Urine:300]  KG:URKYHCW:CBJSEGBT affect, NAD Skin:Warm and dry, brisk capillary refill HEENT:normocephalic, sclera clear, mucus membranes moist Neck:supple, no JVD Heart:S1S2 RRR without murmur, gallup, rub or click Lungs: with bibasilar rales, occ rhonchi, no wheezes DVV:OHYW, non tender, + BS, do not palpate liver spleen or masses Ext:no lower ext edema, 2+ pedal pulses, 2+ radial pulses Neuro:alert and oriented, MAE, follows commands, + facial symmetry Tele: SR with PVCs   Lab Results:  Recent Labs  11/05/16 0253  WBC 10.6*  HGB 9.8*  HCT 31.9*  PLT 309   BMET  Recent Labs  11/05/16 0253  NA 131*  K 4.2  CL 100*  CO2 20*  GLUCOSE 253*  BUN 30*  CREATININE 1.36*  CALCIUM 9.5   No results for input(s): TROPONINI in the last 72 hours.  Invalid input(s): CK, MB  No results found for: CHOL, HDL, LDLCALC, LDLDIRECT, TRIG, CHOLHDL No results found for: HGBA1C   Lab Results  Component Value Date   TSH 2.077 11/05/2016        Studies/Results: Dg Chest Port 1 View  Result Date: 11/05/2016 CLINICAL DATA:  Shortness of breath. EXAM: PORTABLE CHEST 1 VIEW COMPARISON:  Radiograph 10/19/2016. CT 07/11/2016 FINDINGS: Cardiomegaly is similar. Mediastinal contours are unchanged. Atherosclerosis of the thoracic aorta. Calcified left mediastinal lymph nodes. Increased pulmonary edema from prior exam. Blunting of the costophrenic angles, right greater than left,  consistent with small effusions. Again seen hyperinflation. No focal airspace disease to suggest pneumonia. No pneumothorax. IMPRESSION: Increasing pulmonary edema, with persistent cardiomegaly and pleural effusions. Findings consistent with worsening CHF. Electronically Signed   By: Jeb Levering M.D.   On: 11/05/2016 03:41    Medications: I have reviewed the patient's current medications. Scheduled Meds: . amiodarone  400 mg Oral BID  . aspirin  81 mg Oral Daily  . furosemide  40 mg Intravenous Q12H  . insulin aspart  0-5 Units Subcutaneous QHS  . insulin aspart  0-9 Units Subcutaneous TID WC  . losartan  50 mg Oral Daily  . pantoprazole  40 mg Oral Daily   Continuous Infusions: PRN Meds:.polyvinyl alcohol  Assessment/Plan: Principal Problem:   Acute on chronic combined systolic and diastolic congestive heart failure (HCC) Active Problems:   Uncontrolled hypertension   Diabetes mellitus without complication (HCC)   HLD (hyperlipidemia)   CKD (chronic kidney disease), stage III   Tobacco abuse   Acute respiratory failure with hypoxia (HCC)   Multifocal PVCs   Moderate mitral regurgitation   Acute on chronic combined systolic and diastolic CHF (congestive heart failure) (HCC)   Microcytic anemia  Acute on chronic systolic and diastolic HF with acute respiratory failure  -- recent echo 10/24/16 with EF 3540% G2DD, mod MR small pericardial effusion. Marked reduction on in LV function form 2016, + PVCs -- nuc study 10/24/16 without ischemia, freq PVCs -- on bipap and IV NTG initially now off --? HF with amiodarone will  ask EP to weigh in- Dr. Curt Bears here tomorrow ( she had 4 doses prior to admit) - communicated with Dr. Curt Bears will stop amiodarone for now and check amiodarone level.  he will see tomorrow.  --follow troponins- may need Rt and Lt heart cath - will see tomorrow to decide.  PVCs freq on holter monitor PVCs 29% of time. Has seen EP Dr. Curt Bears " it appears that her  PVCs are multifocal. Some of them do appear to be coming  From the outflow tracts, and would be amenable to ablation. That being said, she would ather try medical management prior to Ablation. We will start her on amiodarone.I did give her the option of admission to the hospital with initiation of sotalol, but she wished to try amiodarone first."  Nonischemic cardiomyopathy: Likely due to her elevated PVC burden. She is on optimal medical therapy with metoprolol and an ARB. Hopefully decreasing her PVC  Burden with amiodarone will help to improve her ejection fraction.   Hyperlipidemia: She is on lovastatin which has an interaction with amiodarone. We'll switch her to Lipitor.  LOS: 0 days   Time spent with pt. : 15 minutes. Cecilie Kicks  Nurse Practitioner Certified Pager [phone] or after 5pm and on weekends call [phone] 11/05/2016, 11:50 AM

## 2016-11-05 NOTE — ED Provider Notes (Signed)
[facility] DEPT Provider Note   CSN: [id number] Arrival date & time: 11/05/16  0241  By signing my name below, I, Krista Mason, attest that this documentation has been prepared under the direction and in the presence of Krista Hacker, MD. Electronically signed, Krista Mason, ED Scribe. 11/05/16. 3:00 AM.  History   Chief Complaint Chief Complaint  Patient presents with  . Shortness of Breath    HPI HPI Comments: Krista Mason is a 81 y.o. female, with hx of DM, MAT, CHF, HTN, brought in by ambulance, who presents to the Emergency Department for sudden onset, worsening shortness of breath that started just prior to arrival. Per EMS, on arrival pt was tachypneic with audible wheezing. EMS gave 10mg  Albuterol, 0.5 Atrovent, 2 Nitro, 2g Mg IV and 4mg  Zofran. She reports some mild sinus drainage today but no recent illness or fever. Reports that the shortness of breath that came on all of a sudden. Pt is currently placed on Bipap and reports only mild relief of shortness of breath so far. She had a new recent Hx of CHF. She is unsure of any leg swelling as she has not looked. Pt has been a chronic smoker for approximately 40 years, she has decreased to 1 or 2 cigarettes per day. She denies any Hx of COPD. No Hx of similar symptoms. She has never required an inhaler.  Note, patient recently evaluated by Dr. Curt Bears.  She was started on amiodarone and has taken 2 full doses. She has a history of multifocal atrial tachycardia. She has atrial fibrillation listed but this is unclear. She is not on any chronic anticoagulants. Recent EF reduced 35-40%. Negative Myoview for ischemia.  The history is provided by the patient and the EMS personnel. No language interpreter was used.    Past Medical History:  Diagnosis Date  . Atrial fibrillation ([place])   . Chronic kidney disease   . Diabetes mellitus without complication ([place])   . Hx of echocardiogram    Echo (1/16):  EF 60-65%, no RWMA, Gr 1 DD,  mod AI, mod MR, mild LAE  . Hyperkalemia   . Hyperlipidemia   . Hypertension   . Orthostatic hypotension   . PVC (premature ventricular contraction)   . Rheumatic fever   . Sleep apnea     Patient Active Problem List   Diagnosis Date Noted  . HTN (hypertension) 06/18/2016  . Multifocal atrial tachycardia ([place]) 11/26/2014  . PVD (peripheral vascular disease) ([place]) 12/15/2012    Past Surgical History:  Procedure Laterality Date  . KNEE SURGERY Left 2009  . OVARIAN CYST REMOVAL      OB History    No data available       Home Medications    Prior to Admission medications   Medication Sig Start Date End Date Taking? Authorizing Provider  acetaminophen (TYLENOL) 325 MG tablet Take 325 mg by mouth every 6 (six) hours as needed. For pain    Historical Provider, MD  amiodarone (PACERONE) 200 MG tablet Take 2 tablets (400 mg total) twice a day for 14 days. Then reduce and take 2 tablets (400 mg total) once a day for 14 days. 11/01/16   Krista [name], MD  amiodarone (PACERONE) 200 MG tablet Take 1 tablet (200 mg total) by mouth daily. 11/01/16   Krista [name], MD  aspirin 81 MG tablet Take 81 mg by mouth daily.    Historical Provider, MD  atorvastatin (LIPITOR) 20 MG tablet Take 1 tablet (20  mg total) by mouth daily. 11/01/16 01/30/17  Krista [name], MD  cholecalciferol (VITAMIN D) 1000 UNITS tablet Take 1,000 Units by mouth daily.     Historical Provider, MD  furosemide (LASIX) 40 MG tablet Take 20 mg by mouth daily.     Historical Provider, MD  losartan (COZAAR) 50 MG tablet Take 50 mg by mouth daily. 11/15/14   Historical Provider, MD  metFORMIN (GLUCOPHAGE) 500 MG tablet Take 500 mg by mouth 2 (two) times daily with a meal.    Historical Provider, MD  metoprolol tartrate (LOPRESSOR) 25 MG tablet Take 1 tablet (25 mg total) by mouth 2 (two) times daily. 10/26/16 10/26/17  Krista Erie Noe, PA-C  omeprazole (PRILOSEC) 40 MG capsule TK 1 C PO QD 08/10/16   Historical  Provider, MD  ONE TOUCH ULTRA TEST test strip USE TO TEST YOUR BLOOD SUGAR ONCE DAILY UTD 07/19/16   Historical Provider, MD  Krista Mason DELICA LANCETS 03E [place] USE TO CHECK YOUR BLOOD SUGAR ONCE DAILY UTD 07/19/16   Historical Provider, MD  Polyethyl Glycol-Propyl Glycol (SYSTANE OP) Apply 2 drops to eye 2 (two) times daily as needed. For dry eyes    Historical Provider, MD  traMADol (ULTRAM) 50 MG tablet TK 1-2 TS PO Q 6-8 H PRF SEVERE PAIN 07/20/16   Historical Provider, MD    Family History Family History  Problem Relation Age of Onset  . Heart attack Father   . Heart disease Father   . Cancer Father   . Hypertension Father   . Hypertension Brother   . Stroke Brother   . Hypertension Brother   . Diabetes Son   . Hyperlipidemia Son   . Hypertension Son     Social History Social History  Substance Use Topics  . Smoking status: Current Every Day Smoker    Packs/day: 0.75    Years: 50.00  . Smokeless tobacco: Never Used     Comment: pt states " I'm thinking about it"  . Alcohol use No     Allergies   Penicillins; Ceclor [cefaclor]; Crestor [rosuvastatin calcium]; Epinephrine; Nsaids; Other; Percocet [oxycodone-acetaminophen]; Prednisone; and Lidocaine hcl   Review of Systems Review of Systems  Constitutional: Negative for fever.  Respiratory: Positive for shortness of breath. Negative for cough.   Cardiovascular: Positive for leg swelling. Negative for chest pain.  Gastrointestinal: Negative for diarrhea, nausea and vomiting.  All other systems reviewed and are negative.    Physical Exam Updated Vital Signs BP 164/79   Pulse (!) 52   Temp 97.8 F (36.6 C) (Oral)   Resp 25   Ht 5\' 4"  (1.626 m)   Wt 132 lb (59.9 kg)   SpO2 100%   BMI 22.66 kg/m   Physical Exam  Constitutional: She is oriented to person, place, and time.  Ill-appearing but nontoxic, on BiPAP  HENT:  Head: Normocephalic and atraumatic.  Cardiovascular: Normal rate and normal heart sounds.     No murmur heard. Irregular rhythm  Pulmonary/Chest: Effort normal. No respiratory distress. She has wheezes.  Expiratory wheezing upper lung fields, coarse rales and crackles bilateral lung fields with diminished breath sounds right greater than left  Abdominal: Soft. Bowel sounds are normal. There is no tenderness. There is no guarding.  Musculoskeletal: She exhibits edema.  1+ bilateral pitting edema  Neurological: She is alert and oriented to person, place, and time.  Skin: Skin is warm and dry.  Psychiatric: She has a normal mood and affect.  Nursing note and vitals  reviewed.    ED Treatments / Results  DIAGNOSTIC STUDIES: Oxygen Saturation is 99% on RA, normal by my interpretation.  COORDINATION OF CARE: 2:53 AM-Discussed treatment plan with pt at bedside and pt agreed to plan.   Labs (all labs ordered are listed, but only abnormal results are displayed) Labs Reviewed  BASIC METABOLIC PANEL - Abnormal; Notable for the following:       Result Value   Sodium 131 (*)    Chloride 100 (*)    CO2 20 (*)    Glucose, Bld 253 (*)    BUN 30 (*)    Creatinine, Ser 1.36 (*)    GFR calc non Af Amer 36 (*)    GFR calc Af Amer 42 (*)    All other components within normal limits  CBC - Abnormal; Notable for the following:    WBC 10.6 (*)    Hemoglobin 9.8 (*)    HCT 31.9 (*)    MCV 76.7 (*)    MCH 23.6 (*)    RDW 16.3 (*)    All other components within normal limits  BRAIN NATRIURETIC PEPTIDE - Abnormal; Notable for the following:    B Natriuretic Peptide 1,409.7 (*)    All other components within normal limits  I-STAT TROPOININ, ED    EKG  EKG Interpretation  Date/Time:  Monday November 05 2016 02:47:03 EST Ventricular Rate:  88 PR Interval:    QRS Duration: 94 QT Interval:  438 QTC Calculation: 509 R Axis:   42 Text Interpretation:  Atrial fibrillation vs multifocal atrial rhythm Ventricular bigeminy Borderline low voltage, extremity leads Left ventricular  hypertrophy Prolonged QT interval Reconfirmed by Dina Rich  MD, Raelea Gosse ([id number]) on 11/05/2016 4:32:06 AM       EKG Interpretation  Date/Time:  Monday November 05 2016 04:13:11 EST Ventricular Rate:  63 PR Interval:    QRS Duration: 89 QT Interval:  465 QTC Calculation: 476 R Axis:   38 Text Interpretation:  Sinus rhythm Ventricular premature complex Prolonged PR interval Probable left atrial enlargement Left ventricular hypertrophy Confirmed by Dina Rich  MD, [place] ([id number]) on 11/05/2016 4:53:40 AM        Radiology Dg Chest Port 1 View  Result Date: 11/05/2016 CLINICAL DATA:  Shortness of breath. EXAM: PORTABLE CHEST 1 VIEW COMPARISON:  Radiograph 10/19/2016. CT 07/11/2016 FINDINGS: Cardiomegaly is similar. Mediastinal contours are unchanged. Atherosclerosis of the thoracic aorta. Calcified left mediastinal lymph nodes. Increased pulmonary edema from prior exam. Blunting of the costophrenic angles, right greater than left, consistent with small effusions. Again seen hyperinflation. No focal airspace disease to suggest pneumonia. No pneumothorax. IMPRESSION: Increasing pulmonary edema, with persistent cardiomegaly and pleural effusions. Findings consistent with worsening CHF. Electronically Signed   By: Jeb Levering M.D.   On: 11/05/2016 03:41    Procedures Procedures (including critical care time)  CRITICAL CARE Performed by: Krista Mason   Total critical care time: 40 minutes  Critical care time was exclusive of separately billable procedures and treating other patients.  Critical care was necessary to treat or prevent imminent or life-threatening deterioration.  Critical care was time spent personally by me on the following activities: development of treatment plan with patient and/or surrogate as well as nursing, discussions with consultants, evaluation of patient's response to treatment, examination of patient, obtaining history from patient or surrogate, ordering and  performing treatments and interventions, ordering and review of laboratory studies, ordering and review of radiographic studies, pulse oximetry and re-evaluation of patient's condition.  Medications Ordered in ED Medications  nitroGLYCERIN 50 mg in dextrose 5 % 250 mL (0.2 mg/mL) infusion (5 mcg/min Intravenous New Bag/Given 11/05/16 0434)  furosemide (LASIX) injection 40 mg (40 mg Intravenous Given 11/05/16 0430)     Initial Impression / Assessment and Plan / ED Course  I have reviewed the triage vital signs and the nursing notes.  Pertinent labs & imaging results that were available during my care of the patient were reviewed by me and considered in my medical decision making (see chart for details).  Clinical Course     Patient presents with acute onset of shortness of breath. History of heart failure as well as COPD although patient states that she is never been on BiPAP and does not take any COPD medications. Initial EKG looks like as ectopic atrial activity and is irregular but unclear whether this truly A. fib. Repeat EKG in normal sinus rhythm. Clinical presentation is most suspicious for heart failure although COPD not cannot be excluded. She is already received inhaler and steroids but patient had improvement markedly on BiPAP. Chest x-ray shows pleural effusions, BNP is elevated, and patient has evidence of peripheral edema. Krista treat for heart failure. Patient given 40 of IV Lasix. She was also placed on a nitroglycerin drip. Cardiology consulted. Per Dr. Hassell Done, given other respiratory and medical problems, preferred admission to hospitalist with cardiology following. Krista page admitting hospitalist.  Final Clinical Impressions(s) / ED Diagnoses   Final diagnoses:  Acute systolic congestive heart failure ([place])    New Prescriptions New Prescriptions   No medications on file   I personally performed the services described in this documentation, which was scribed in my  presence. The recorded information has been reviewed and is accurate.     Krista Hacker, MD 11/05/16 [phone]

## 2016-11-05 NOTE — Consult Note (Signed)
CARDIOLOGY CONSULT NOTE   Referring Physician: [place] Primary Physician: Primary Cardiologist: Nahser Reason for Consultation: flash pulm edema/resp failure   HPI: [name] is a 81yo female w/ h/o newly-dx LV systolic dysfunction (felt to be non-ischemic, due to high burden of ventricular ectopy--29% on recent Holter), HTN, dyslipidemia, COPD w/ continued smoking, Dm2, h/o Histoplasmosis, and CKD currently admitted w/ respiratory failure requiring BiPap. Pt tells me she was just seen in the cardiology clinic a couple days prior to this ED visit. At that time she was started on amiodarone for frequent ventricular ectopy. She also recently was dx w/ LVEF 40% (down from 55-60% in 2016) and has been on lasix for about 1-2 weeks. She had been doing OK until she went to bed this evening, noting that she was a little more SOB than usual. She was coughing up clear phlegm (although it sounds like this is not new for her), and had more trouble than usual breathing when she laid down flat. As things progressed, she began to feel nauseous and clammy in addition and called 911. She denies significant associated chest discomfort. She does continue to have palpitations and feels her heart skipping.  In the ED, she was started on Bipap and given IV lasix. BNP is up and there is evidence of pulm edema on CXR. She has mild LE edema. BP was 181/100 on admission but has come down slightly since she has been started on NTG gtt.    ROS:  Constitutional: no F/C, unexplained weight loss, appetite change Eyes: no blurry vision or vision changes Cardiovascular: see HPI for CV ROS. Negative other than noted in HPI Respiratory: see HPI for respiratory ROS. Negative other than noted in HPI Gi: no N/V, abdominal pain Skin: no rash or lesions Neuro: no recent dizziness or syncope, focal weakness Psych: no depression or suicidal thoughts Heme: no easy bleeding or bruising  All ROS otherwise negative.    Past  Medical History:  Diagnosis Date  . Atrial fibrillation ([place])   . Chronic kidney disease   . Diabetes mellitus without complication ([place])   . Hx of echocardiogram    Echo (1/16):  EF 60-65%, no RWMA, Gr 1 DD, mod AI, mod MR, mild LAE  . Hyperkalemia   . Hyperlipidemia   . Hypertension   . Orthostatic hypotension   . PVC (premature ventricular contraction)   . Rheumatic fever   . Sleep apnea    Prior to Admission medications   Medication Sig Start Date End Date Taking? Authorizing Provider  acetaminophen (TYLENOL) 325 MG tablet Take 325 mg by mouth every 6 (six) hours as needed. For pain    Historical Provider, MD  amiodarone (PACERONE) 200 MG tablet Take 2 tablets (400 mg total) twice a day for 14 days. Then reduce and take 2 tablets (400 mg total) once a day for 14 days. 11/01/16   Will [name], MD  amiodarone (PACERONE) 200 MG tablet Take 1 tablet (200 mg total) by mouth daily. 11/01/16   Will [name], MD  aspirin 81 MG tablet Take 81 mg by mouth daily.    Historical Provider, MD  atorvastatin (LIPITOR) 20 MG tablet Take 1 tablet (20 mg total) by mouth daily. 11/01/16 01/30/17  Will [name], MD  cholecalciferol (VITAMIN D) 1000 UNITS tablet Take 1,000 Units by mouth daily.     Historical Provider, MD  furosemide (LASIX) 40 MG tablet Take 20 mg by mouth daily.     Historical Provider, MD  losartan (COZAAR) 50 MG tablet Take 50 mg by mouth daily. 11/15/14   Historical Provider, MD  metFORMIN (GLUCOPHAGE) 500 MG tablet Take 500 mg by mouth 2 (two) times daily with a meal.    Historical Provider, MD  metoprolol tartrate (LOPRESSOR) 25 MG tablet Take 1 tablet (25 mg total) by mouth 2 (two) times daily. 10/26/16 10/26/17  Brittainy Erie Noe, PA-C  omeprazole (PRILOSEC) 40 MG capsule TK 1 C PO QD 08/10/16   Historical Provider, MD  ONE TOUCH ULTRA TEST test strip USE TO TEST YOUR BLOOD SUGAR ONCE DAILY UTD 07/19/16   Historical Provider, MD  Glory Rosebush DELICA LANCETS 36R [place] USE TO  CHECK YOUR BLOOD SUGAR ONCE DAILY UTD 07/19/16   Historical Provider, MD  Polyethyl Glycol-Propyl Glycol (SYSTANE OP) Apply 2 drops to eye 2 (two) times daily as needed. For dry eyes    Historical Provider, MD  traMADol (ULTRAM) 50 MG tablet TK 1-2 TS PO Q 6-8 H PRF SEVERE PAIN 07/20/16   Historical Provider, MD     (Not in a hospital admission)     Infusions:   Allergies  Allergen Reactions  . Penicillins   . Ceclor [Cefaclor] Itching  . Crestor [Rosuvastatin Calcium] Nausea Only  . Epinephrine Other (See Comments)    "feels like heart is beating out of chest"  . Nsaids     Pt states she is not taking because of kidney function  . Other Other (See Comments)    FLU SHOT  . Percocet [Oxycodone-Acetaminophen] Nausea And Vomiting  . Prednisone     Dizziness, spiked blood sugar  . Lidocaine Hcl Palpitations    Social History   Social History  . Marital status: Widowed    Spouse name: N/A  . Number of children: N/A  . Years of education: N/A   Occupational History  . Not on file.   Social History Main Topics  . Smoking status: Current Every Day Smoker    Packs/day: 0.75    Years: 50.00  . Smokeless tobacco: Never Used     Comment: pt states " I'm thinking about it"  . Alcohol use No  . Drug use: No  . Sexual activity: Not on file   Other Topics Concern  . Not on file   Social History Narrative  . No narrative on file    Family History  Problem Relation Age of Onset  . Heart attack Father   . Heart disease Father   . Cancer Father   . Hypertension Father   . Hypertension Brother   . Stroke Brother   . Hypertension Brother   . Diabetes Son   . Hyperlipidemia Son   . Hypertension Son     PHYSICAL EXAM: Vitals:   11/05/16 0430 11/05/16 0500  BP: 148/77 166/66  Pulse: (!) 58 69  Resp: 24 18  Temp:      No intake or output data in the 24 hours ending 11/05/16 0534  General:  NAD. + respiratory difficulty; examined on bipap HEENT: normal Neck:  supple. + JVD. Carotids 2+ bilat; no bruits. No lymphadenopathy or thryomegaly appreciated. Cor: PMI nondisplaced. Regular rate & rhythm with frequent ectopy. No rubs, gallops or obvious murmurs. Lungs: decreased air movement/BS. diffuse coarse BS bilaterally with a few rales.  Abdomen: soft, nontender, nondistended. No hepatosplenomegaly. No bruits or masses. Good bowel sounds. Extremities: no cyanosis, clubbing, rash. Ext warm and well-perfused. 1+ bilateral edema Neuro: alert & oriented x 3, cranial nerves grossly intact. moves  all 4 extremities w/o difficulty. Affect pleasant.  ECG: most recent NSR with HR 63 and PVC. Frequent PAC/PVC on the monitor  Results for orders placed or performed during the hospital encounter of 11/05/16 (from the past 24 hour(s))  Basic metabolic panel     Status: Abnormal   Collection Time: 11/05/16  2:53 AM  Result Value Ref Range   Sodium 131 (L) 135 - 145 mmol/L   Potassium 4.2 3.5 - 5.1 mmol/L   Chloride 100 (L) 101 - 111 mmol/L   CO2 20 (L) 22 - 32 mmol/L   Glucose, Bld 253 (H) 65 - 99 mg/dL   BUN 30 (H) 6 - 20 mg/dL   Creatinine, Ser 1.36 (H) 0.44 - 1.00 mg/dL   Calcium 9.5 8.9 - 10.3 mg/dL   GFR calc non Af Amer 36 (L) >60 mL/min   GFR calc Af Amer 42 (L) >60 mL/min   Anion gap 11 5 - 15  CBC     Status: Abnormal   Collection Time: 11/05/16  2:53 AM  Result Value Ref Range   WBC 10.6 (H) 4.0 - 10.5 K/uL   RBC 4.16 3.87 - 5.11 MIL/uL   Hemoglobin 9.8 (L) 12.0 - 15.0 g/dL   HCT 31.9 (L) 36.0 - 46.0 %   MCV 76.7 (L) 78.0 - 100.0 fL   MCH 23.6 (L) 26.0 - 34.0 pg   MCHC 30.7 30.0 - 36.0 g/dL   RDW 16.3 (H) 11.5 - 15.5 %   Platelets 309 150 - 400 K/uL  Brain natriuretic peptide     Status: Abnormal   Collection Time: 11/05/16  2:53 AM  Result Value Ref Range   B Natriuretic Peptide 1,409.7 (H) 0.0 - 100.0 pg/mL  I-stat troponin, ED     Status: None   Collection Time: 11/05/16  3:00 AM  Result Value Ref Range   Troponin i, poc 0.01 0.00 -  0.08 ng/mL   Comment 3           Dg Chest Port 1 View  Result Date: 11/05/2016 CLINICAL DATA:  Shortness of breath. EXAM: PORTABLE CHEST 1 VIEW COMPARISON:  Radiograph 10/19/2016. CT 07/11/2016 FINDINGS: Cardiomegaly is similar. Mediastinal contours are unchanged. Atherosclerosis of the thoracic aorta. Calcified left mediastinal lymph nodes. Increased pulmonary edema from prior exam. Blunting of the costophrenic angles, right greater than left, consistent with small effusions. Again seen hyperinflation. No focal airspace disease to suggest pneumonia. No pneumothorax. IMPRESSION: Increasing pulmonary edema, with persistent cardiomegaly and pleural effusions. Findings consistent with worsening CHF. Electronically Signed   By: Jeb Levering M.D.   On: 11/05/2016 03:41   TTE: 10-24-16 nl LV sz with EF 35-40%, grade 2 DD , mild AI , thickened MV leaflets with mod MR , mod dilated LA , nl RV , tr TR , small pericardial effusion, nl IVC diameter  Stress MPI: 10-24-16 non-gated. No ischemia  ASSESSMENT AND PLAN:  1. Flash pulm edema w/ resp failure: probably due to a combination of HTN urgency, LV systolic dysfunction. There may also be an element of COPD exacerbation although she seems to be responding well to diuresis. Would cont IV lasix diuresis (gently as Cr is 1.36). Cont NTG gtt and BiPap for now and wean as tolerated. Would repeat TTE in the morning. She is on losartan and metoprolol tartrate; consideration could be given to switching BB to more HF-proven agent although this can be weighed against treatment for her frequent PVCs and MAT. Would cont to cycle troponins (  1st 0.01)--recent MPI neg for ischemia  2. PVCs/MAT: she has been on amiodarone for a couple days and follows w/ Dr. Curt Bears. EP to see later today to make a decision re: possible PVC ablation vs continuation of amiodarone vs switching to a different anti-arrhythmic.   3. LV systolic dysfunction: see #1 above  4. HTN: BP has been high  but coming down. Would restart home anti-HTN agents and titrate PRN. She is currently on NTG gtt  5. Diabetes/CKD/dyslipidemia: management as per hospitalist svc   I appreciate the opportunity to participate in the care of your patient. Will follow with you. Rudean Curt, MD , [facility] 5:34 AM

## 2016-11-05 NOTE — Progress Notes (Signed)
Continued CPAP from [facility], RT to monitor and titrate as tolerated.

## 2016-11-05 NOTE — ED Triage Notes (Signed)
Pt brought to Ed by EMS from home for SOB, breath very labored with wz on all fields on EMS arrival to her house, 10 mg Albuterol, 0.5 Atrovent, 2 Nitro sl, 2g Mg IV and 4 mg Zofran IV given by EMS, bread sound changed to rales on all fields, pt placed on CPA by EMS with SPO2 99%.

## 2016-11-05 NOTE — Progress Notes (Signed)
This is a no charge note  Pending admission per Dr. Dina Rich  81 year old lady with past medical history of newly-dx combined systolic and diastolic CHF with EF of 45-62 percent, HTN, dyslipidemia, COPD w/ continued smoking, Dm2, h/o Histoplasmosis, and CKD, who presents with acute respiratory distress requiring BiPAP. BNP 1409. CXR showed pulmonary edema, consistence with CHF exacerbation. Cardiology, Dr. Hassell Done is consulted. Patient was given one dose of Lasix 40 mg x 1 in ED. Nitro gtt is started. Pt is accepted to SDU as inpt.  Ivor Costa, MD  [facility] Pager [phone]  If 7PM-7AM, please contact night-coverage [email or web address] Password [facility] 11/05/2016, 5:46 AM

## 2016-11-05 NOTE — H&P (Signed)
History and Physical    Krista Mason NOB:[id number] DOB: January 23, 1936 DOA: 11/05/2016   PCP: Krista Heck, MD   Patient coming from/Resides with: Private residence/lives alone  Admission status: Inpatient/telemetry -medically necessary to stay a minimum 2 midnights to rule out impending and/or unexpected changes in physiologic status that may differ from initial evaluation performed in the ER and/or at time of admission. Patient presents with acute exacerbation of chronic combined systolic/diastolic heart failure with hypoxemia initially requiring BiPAP and IV nitrates. Patient was able to transition off of IV nitrates and BiPAP to nasal cannula oxygen while in the ER. Cardiology has been consulted and has recommended repeating echocardiogram and EP consultation. She may require adjustments in medications including changing antiarrhythmic and/or undergoing ablation treatment.  Chief Complaint: Shortness of breath  HPI: Krista Mason is a 81 y.o. female with medical history significant for hypertension, moderate mitral regurg with mild aortic insufficiency, dyslipidemia, chronic kidney disease and diabetes on metformin. She was initially diagnosed (2016) with heart failure by her primary care physician 2/2 symptoms of lower extremity edema, dyspnea on exertion and orthopnea. Echo demonstrated moderate MR with mild diastolic dysfunction and she was started on medical therapy for the symptoms. After the initiation of Lasix patient's symptoms improved. She was referred to cardiology and was seen on 1/2 due to recurrent symptomatology. An echocardiogram was repeated that revealed worsened systolic function with EF now 35-40% but valvular disease was stable. EKG revealed first reactive to block with frequent PVCs. She underwent noninvasive stress testing which demonstrated no ischemia. It was presumed that due to her multiple multifocal PVCs she may have tachycardia mediated systolic dysfunction.  She was started on amiodarone on 1/11. Her lovastatin was discontinued in favor of Lipitor secondary to interactions from Lipitor.  The patient reports that she had rapid onset of respiratory symptoms during the night associated with coughing of "stringy" mucoid secretions. She is not had any fevers or chills. She is unable to receive a flu shot due to reported allergy. She did receive pneumococcal vaccine.  ED Course:  Vital Signs: BP 111/64   Pulse (!) 39   Temp 97.8 F (36.6 C) (Oral)   Resp 19   Ht 5\' 4"  (1.626 m)   Wt 59.9 kg (132 lb)   SpO2 100%   BMI 22.66 kg/m  PCXR: Increased pulmonary edema, cardiomegaly and pleural effusions consistent with CHF Lab data: Sodium 131, potassium 4.2, chloride 100, CO2 20, glucose 253, BUN 30, creatinine 1.36, BNP 1410, poc troponin 0.01, white count 10,600 differential not obtained, hemoglobin 9.8, MCV 76.7, platelets 309,000 Medications and treatments: Nitroglycerin infusion, Lasix 40 mg IV 1  Review of Systems:  In addition to the HPI above,  No Fever-chills, myalgias or other constitutional symptoms No Headache, changes with Vision or hearing, new weakness, tingling, numbness in any extremity, dizziness, dysarthria or word finding difficulty, gait disturbance or imbalance, tremors or seizure activity No problems swallowing food or Liquids, indigestion/reflux, choking or coughing while eating, abdominal pain with or after eating No Chest pain No Abdominal pain, N/V, melena,hematochezia, dark tarry stools, constipation No dysuria, malodorous urine, hematuria or flank pain No new skin rashes, lesions, masses or bruises, No new joint pains, aches, swelling or redness No recent unintentional weight gain or loss No polyuria, polydypsia or polyphagia   Past Medical History:  Diagnosis Date  . Atrial fibrillation ([place])   . Chronic kidney disease   . Diabetes mellitus without complication ([place])   . Hx of echocardiogram  Echo (1/16):  EF  60-65%, no RWMA, Gr 1 DD, mod AI, mod MR, mild LAE  . Hyperkalemia   . Hyperlipidemia   . Hypertension   . Orthostatic hypotension   . PVC (premature ventricular contraction)   . Rheumatic fever   . Sleep apnea     Past Surgical History:  Procedure Laterality Date  . KNEE SURGERY Left 2009  . OVARIAN CYST REMOVAL      Social History   Social History  . Marital status: Widowed    Spouse name: N/A  . Number of children: N/A  . Years of education: N/A   Occupational History  . Not on file.   Social History Main Topics  . Smoking status: Current Every Day Smoker    Packs/day: 0.75    Years: 50.00  . Smokeless tobacco: Never Used     Comment: pt states " I'm thinking about it"  . Alcohol use No  . Drug use: No  . Sexual activity: Not on file   Other Topics Concern  . Not on file   Social History Narrative  . No narrative on file    Mobility: Without assistive devices Work history: Retired Equities trader   Allergies  Allergen Reactions  . Penicillins   . Ceclor [Cefaclor] Itching  . Crestor [Rosuvastatin Calcium] Nausea Only  . Epinephrine Other (See Comments)    "feels like heart is beating out of chest"  . Nsaids     Pt states she is not taking because of kidney function  . Other Other (See Comments)    FLU SHOT  . Percocet [Oxycodone-Acetaminophen] Nausea And Vomiting  . Prednisone     Dizziness, spiked blood sugar  . Lidocaine Hcl Palpitations    Family History  Problem Relation Age of Onset  . Heart attack Father   . Heart disease Father   . Cancer Father   . Hypertension Father   . Hypertension Brother   . Stroke Brother   . Hypertension Brother   . Diabetes Son   . Hyperlipidemia Son   . Hypertension Son      Prior to Admission medications   Medication Sig Start Date End Date Taking? Authorizing Provider  acetaminophen (TYLENOL) 325 MG tablet Take 325 mg by mouth every 6 (six) hours as needed. For pain    Historical Provider, MD    amiodarone (PACERONE) 200 MG tablet Take 2 tablets (400 mg total) twice a day for 14 days. Then reduce and take 2 tablets (400 mg total) once a day for 14 days. 11/01/16   Will [name], MD  amiodarone (PACERONE) 200 MG tablet Take 1 tablet (200 mg total) by mouth daily. 11/01/16   Will [name], MD  aspirin 81 MG tablet Take 81 mg by mouth daily.    Historical Provider, MD  atorvastatin (LIPITOR) 20 MG tablet Take 1 tablet (20 mg total) by mouth daily. 11/01/16 01/30/17  Will [name], MD  cholecalciferol (VITAMIN D) 1000 UNITS tablet Take 1,000 Units by mouth daily.     Historical Provider, MD  furosemide (LASIX) 40 MG tablet Take 20 mg by mouth daily.     Historical Provider, MD  losartan (COZAAR) 50 MG tablet Take 50 mg by mouth daily. 11/15/14   Historical Provider, MD  metFORMIN (GLUCOPHAGE) 500 MG tablet Take 500 mg by mouth 2 (two) times daily with a meal.    Historical Provider, MD  metoprolol tartrate (LOPRESSOR) 25 MG tablet Take 1 tablet (25 mg  total) by mouth 2 (two) times daily. 10/26/16 10/26/17  Brittainy Erie Noe, PA-C  omeprazole (PRILOSEC) 40 MG capsule TK 1 C PO QD 08/10/16   Historical Provider, MD  ONE TOUCH ULTRA TEST test strip USE TO TEST YOUR BLOOD SUGAR ONCE DAILY UTD 07/19/16   Historical Provider, MD  Glory Rosebush DELICA LANCETS 68T [place] USE TO CHECK YOUR BLOOD SUGAR ONCE DAILY UTD 07/19/16   Historical Provider, MD  Polyethyl Glycol-Propyl Glycol (SYSTANE OP) Apply 2 drops to eye 2 (two) times daily as needed. For dry eyes    Historical Provider, MD  traMADol (ULTRAM) 50 MG tablet TK 1-2 TS PO Q 6-8 H PRF SEVERE PAIN 07/20/16   Historical Provider, MD    Physical Exam: Vitals:   11/05/16 0430 11/05/16 0500 11/05/16 0545 11/05/16 0615  BP: 148/77 166/66 127/58 111/64  Pulse: (!) 58 69 (!) 39   Resp: 24 18 19    Temp:      TempSrc:      SpO2: 100% 100% 100% 100%  Weight:      Height:          Constitutional: NAD, calm, comfortable Eyes: PERRL, lids and  conjunctivae normal ENMT: Mucous membranes are moist. Posterior pharynx clear of any exudate or lesions.Normal dentition.  Neck: normal, supple, no masses, no thyromegaly Respiratory: Mostly clear to auscultation with fine crackles in the mid fields with decreased sounds bilateral bases, Normal respiratory effort. No accessory muscle use. BiPAP in place Cardiovascular: Regular rate and rhythm with underlying first-degree AV block,, no rubs / gallops. Grade 2/6 systolic murmur left sternal border fourth intercostal space, 2+ bilateral lower extremity edema. 2+ pedal pulses. No carotid bruits.  Abdomen: no tenderness, no masses palpated. No hepatosplenomegaly. Bowel sounds positive.  Musculoskeletal: no clubbing / cyanosis. No joint deformity upper and lower extremities. Good ROM, no contractures. Normal muscle tone.  Skin: no rashes, lesions, ulcers. No induration Neurologic: CN 2-12 grossly intact. Sensation intact, DTR normal. Strength 5/5 x all 4 extremities.  Psychiatric: Normal judgment and insight. Alert and oriented x 3. Normal mood.    Labs on Admission: I have personally reviewed following labs and imaging studies  CBC:  Recent Labs Lab 11/05/16 0253  WBC 10.6*  HGB 9.8*  HCT 31.9*  MCV 76.7*  PLT 419   Basic Metabolic Panel:  Recent Labs Lab 11/05/16 0253  NA 131*  K 4.2  CL 100*  CO2 20*  GLUCOSE 253*  BUN 30*  CREATININE 1.36*  CALCIUM 9.5   GFR: Estimated Creatinine Clearance: 29.4 mL/min (by C-G formula based on SCr of 1.36 mg/dL (H)). Liver Function Tests: No results for input(s): AST, ALT, ALKPHOS, BILITOT, PROT, ALBUMIN in the last 168 hours. No results for input(s): LIPASE, AMYLASE in the last 168 hours. No results for input(s): AMMONIA in the last 168 hours. Coagulation Profile: No results for input(s): INR, PROTIME in the last 168 hours. Cardiac Enzymes: No results for input(s): CKTOTAL, CKMB, CKMBINDEX, TROPONINI in the last 168 hours. BNP (last 3  results)  Recent Labs  10/23/16 0939  PROBNP 4,005*   HbA1C: No results for input(s): HGBA1C in the last 72 hours. CBG: No results for input(s): GLUCAP in the last 168 hours. Lipid Profile: No results for input(s): CHOL, HDL, LDLCALC, TRIG, CHOLHDL, LDLDIRECT in the last 72 hours. Thyroid Function Tests: No results for input(s): TSH, T4TOTAL, FREET4, T3FREE, THYROIDAB in the last 72 hours. Anemia Panel: No results for input(s): VITAMINB12, FOLATE, FERRITIN, TIBC, IRON, RETICCTPCT in the  last 72 hours. Urine analysis:    Component Value Date/Time   COLORURINE YELLOW 04/15/2012 1451   APPEARANCEUR HAZY (A) 04/15/2012 1451   LABSPEC 1.011 04/15/2012 1451   PHURINE 5.5 04/15/2012 1451   GLUCOSEU NEGATIVE 04/15/2012 1451   HGBUR SMALL (A) 04/15/2012 1451   BILIRUBINUR NEGATIVE 04/15/2012 1451   KETONESUR NEGATIVE 04/15/2012 1451   PROTEINUR NEGATIVE 04/15/2012 1451   UROBILINOGEN 0.2 04/15/2012 1451   NITRITE NEGATIVE 04/15/2012 1451   LEUKOCYTESUR SMALL (A) 04/15/2012 1451   Sepsis Labs: @LABRCNTIP (procalcitonin:4,lacticidven:4) )No results found for this or any previous visit (from the past 240 hour(s)).   Radiological Exams on Admission: Dg Chest Port 1 View  Result Date: 11/05/2016 CLINICAL DATA:  Shortness of breath. EXAM: PORTABLE CHEST 1 VIEW COMPARISON:  Radiograph 10/19/2016. CT 07/11/2016 FINDINGS: Cardiomegaly is similar. Mediastinal contours are unchanged. Atherosclerosis of the thoracic aorta. Calcified left mediastinal lymph nodes. Increased pulmonary edema from prior exam. Blunting of the costophrenic angles, right greater than left, consistent with small effusions. Again seen hyperinflation. No focal airspace disease to suggest pneumonia. No pneumothorax. IMPRESSION: Increasing pulmonary edema, with persistent cardiomegaly and pleural effusions. Findings consistent with worsening CHF. Electronically Signed   By: Jeb Levering M.D.   On: 11/05/2016 03:41     EKG: (Independently reviewed) Sinus rhythm with first degree AV block, ventricular rate 63 bpm, unifocal PVC, QTC 476 ms, voltage criteria for LVH, no ischemic changes and unchanged from previous EKGs  Assessment/Plan Principal Problem:   Acute on chronic combined systolic and diastolic congestive heart failure/bilateral pleural effusion  -Patient presents with abrupt onset of shortness of breath with accelerated hypertension at presentation -Symptoms have markedly improved with very low-dose IV nitrates and 1 dose IV Lasix -Acute heart failure order set initiated -Cardiology fellow has seen overnight -Echo just completed as an outpatient on 04/21/68 with new systolic heart failure presumably related to multifocal PVCs prompting discontinuation of Cardizem -Continue Cozaar -Hold Lopressor secondary to acute decompensation and underlying first-degree AV block -Daily weights, strict I/O -Lasix 40 mg IV twice a day with next dose 8 PM -2 view chest x-ray in a.m. -DC IV nitroglycerin  Active Problems:   Acute respiratory failure with hypoxia  -Secondary to acute heart failure -No evidence of COPD on chest x-ray or clinically -Markedly improved so we'll discontinue BiPAP and transition to nasal cannula oxygen    Multifocal PVCs -Amiodarone initiated as an outpatient with current dose 400 twice a day for the next 12 days then down to daily dosing -Cardiology has documented may need to readjust antiarrhythmic such as starting sotalol versus consider ablation    Moderate mitral regurgitation/mild aortic insufficiency -Per recent echocardiogram valvular disease is stable    Uncontrolled hypertension -Presented with markedly elevated blood pressure now improved after initiation of Lasix and IV nitrates -Resume Cozaar -Continuing Lasix with discontinuing IV nitrates (was only on 1.5 mL/h)    Diabetes mellitus without complication  -Mildly hyperglycemic at presentation -Hold metformin  especially with attempts to diurese/acute illness -Hemoglobin A1c -CBGs -SSI    CKD (chronic kidney disease), stage III -Mild acute kidney injury in setting of recent hypoxemia and acute heart failure with anticipation will return to baseline with diuresis -Baseline: 23/1.24 -Continue to monitor renal function closely in the outpatient setting given concomitant need for metformin and ARB    HLD (hyperlipidemia) -Recently lovastatin discontinued in favor of Lipitor due to drug to drug interactions with amiodarone    Tobacco abuse -Continues to smoke but typically smokes  no more than 1-2 cigarettes per day and states most days she does not smoke at all -No chest x-ray evidence of COPD and no clinical evidence in the past 12 months of COPD related symptoms    Microcytic anemia -Hemoglobin in 2013 was 12.9 -Current hemoglobin 9.8 but decrease could potentially be dilutional secondary to heart failure exacerbation -Follow trends in hemoglobin after diuresis -Check TSH and anemia panel      DVT prophylaxis: Lovenox  Code Status: Full  Family Communication: Family at bedside with patient's permission Disposition Plan: Anticipate discharge back to preadmission home environment when medically stable Consults called: Cardiology/ Dr. Hassell Done cardiology fellow([facility] on-call)    Samella Parr ANP-BC [facility] Pager [phone]   If 7PM-7AM, please contact night-coverage [email or web address] Password [email or web address]  11/05/2016, 7:59 AM

## 2016-11-06 ENCOUNTER — Inpatient Hospital Stay (HOSPITAL_COMMUNITY): Payer: Medicare HMO

## 2016-11-06 DIAGNOSIS — I5043 Acute on chronic combined systolic (congestive) and diastolic (congestive) heart failure: Secondary | ICD-10-CM

## 2016-11-06 LAB — BASIC METABOLIC PANEL
ANION GAP: 11 (ref 5–15)
BUN: 25 mg/dL — ABNORMAL HIGH (ref 6–20)
CO2: 25 mmol/L (ref 22–32)
Calcium: 9.1 mg/dL (ref 8.9–10.3)
Chloride: 97 mmol/L — ABNORMAL LOW (ref 101–111)
Creatinine, Ser: 1.42 mg/dL — ABNORMAL HIGH (ref 0.44–1.00)
GFR calc Af Amer: 40 mL/min — ABNORMAL LOW (ref >60)
GFR, EST NON AFRICAN AMERICAN: 34 mL/min — AB (ref >60)
Glucose, Bld: 99 mg/dL (ref 65–99)
POTASSIUM: 3.6 mmol/L (ref 3.5–5.1)
Sodium: 133 mmol/L — ABNORMAL LOW (ref 135–145)

## 2016-11-06 LAB — TROPONIN I: Troponin I: <0.03 ng/mL (ref <0.03)

## 2016-11-06 LAB — GLUCOSE, CAPILLARY
GLUCOSE-CAPILLARY: 98 mg/dL (ref 65–99)
GLUCOSE-CAPILLARY: 170 mg/dL — AB (ref 65–99)
Glucose-Capillary: 82 mg/dL (ref 65–99)
Glucose-Capillary: 107 mg/dL — ABNORMAL HIGH (ref 65–99)

## 2016-11-06 LAB — HEMOGLOBIN A1C
HEMOGLOBIN A1C: 6.2 % — AB (ref 4.8–5.6)
Mean Plasma Glucose: 131 mg/dL

## 2016-11-06 LAB — AMIODARONE LEVEL
Amiodarone Lvl: 3.4 ug/mL — ABNORMAL HIGH (ref 1.0–2.5)
N-Desethyl-Amiodarone: 0.3 ug/mL — ABNORMAL LOW (ref 1.0–2.5)

## 2016-11-06 IMAGING — CR DG CHEST 2V
2 series · 2 of 2 positions shown · non-contrast
Comparison: [DATE]

CLINICAL DATA: Acute on chronic systolic and diastolic CHF.

EXAM:
CHEST  2 VIEW

[chest lat]
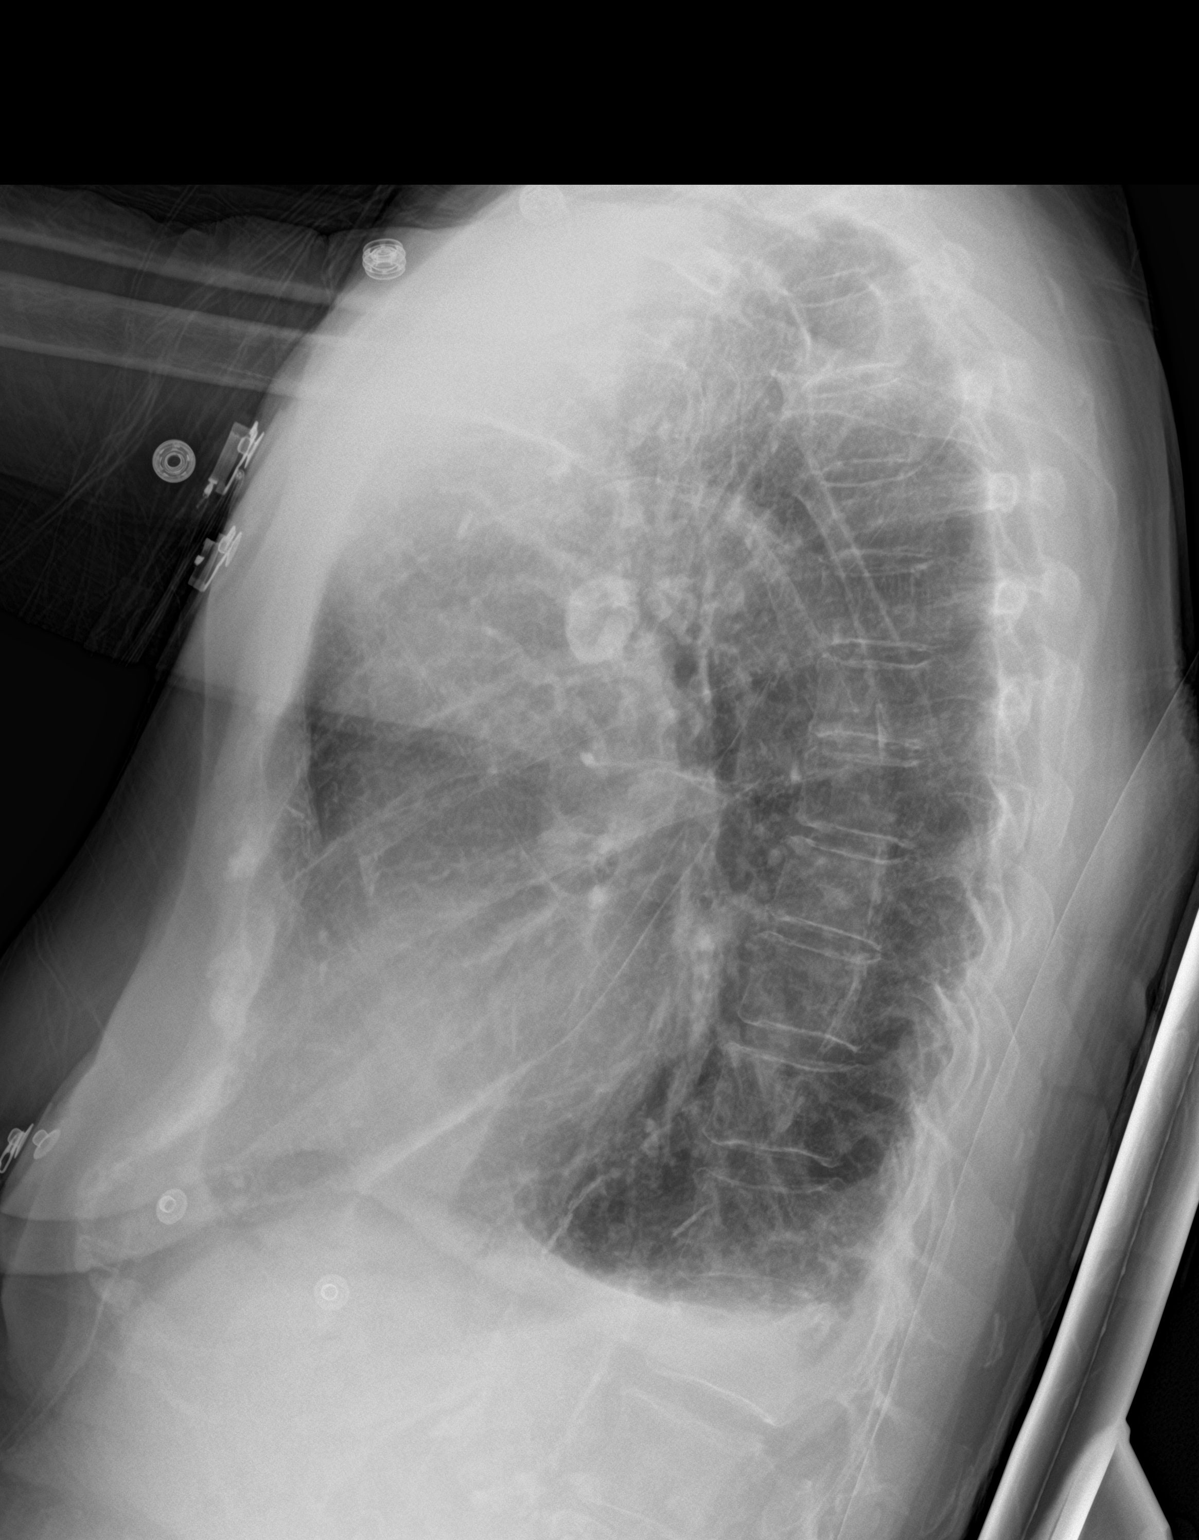

[chest ap]
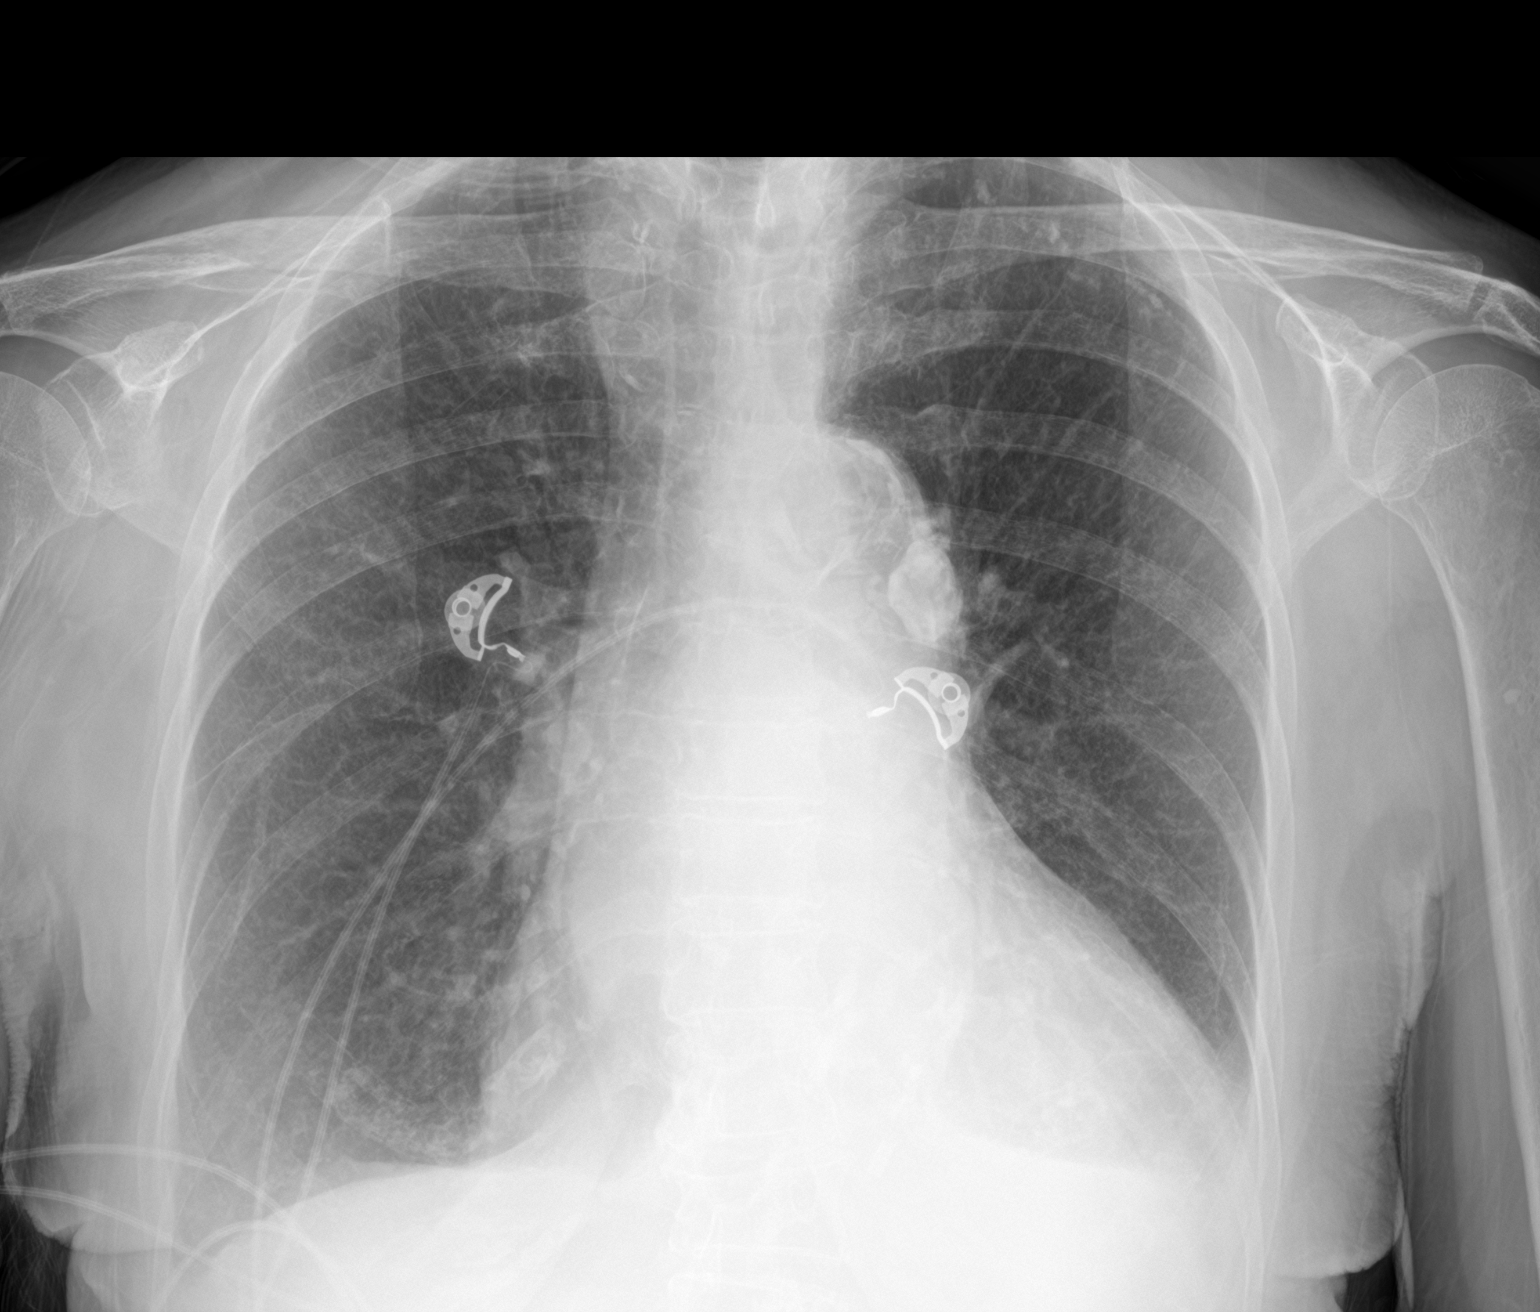

[2 of 2 positions shown; findings below may reference images not displayed]

FINDINGS: Lungs are hyperexpanded as before. The diffuse interstitial and
basilar alveolar opacity has resolved in the interval. Tiny
bilateral pleural effusions persist. The cardio pericardial
silhouette is enlarged. Densely calcified lymph node identified AP
window, stable. Bones are diffusely demineralized. Compression
deformity identified in the region of the thoracolumbar junction,
likely L1. Telemetry leads overlie the chest.
IMPRESSION: Interval resolution of interstitial pulmonary edema with some
minimal persistent bilateral pleural effusions.

## 2016-11-06 MED ORDER — CARVEDILOL 3.125 MG PO TABS
3.1250 mg | ORAL_TABLET | Freq: Two times a day (BID) | ORAL | Status: DC
  Administered 2016-11-06 – 2016-11-08 (×4): 3.125 mg via ORAL
  Filled 2016-11-06 (×4): qty 1

## 2016-11-06 MED ORDER — FERROUS GLUCONATE 324 (38 FE) MG PO TABS
324.0000 mg | ORAL_TABLET | Freq: Every day | ORAL | Status: DC
  Administered 2016-11-07 – 2016-11-09 (×3): 324 mg via ORAL
  Filled 2016-11-06 (×3): qty 1

## 2016-11-06 NOTE — Consult Note (Signed)
ELECTROPHYSIOLOGY CONSULT NOTE    Patient ID: Krista Mason MRN: [id number], DOB/AGE: 1935/12/21 81 y.o.  Admit date: 11/05/2016 Date of Consult: 11/06/2016   Primary Physician: Gerrit Heck, MD Primary Cardiologist: N[place]ser Electrophysiologist: Dr. Curt Bears Requesting MD: Dr. Gwenlyn Found  Reason for Consultation: PVC's  HPI: Krista Mason is a 81 y.o. female with PMHx of HTN, COPD, CRI, CM possiblt secondary to an increased PVC burden, histoplasmosis comes to [facility] with increasing DOE/SOB, admitted with acute CHF exacerbation initially requiring BIPAP support and IV nitrates, though much improved with diuresis, perhaps some component of COPD, though much improved today.  Cardiology team is on the case with thoughts of possible R/LHC today.  The patient is known to EP service, Dr. Curt Bears, referred recently for elevated PVC burden (29%) with concern these may be the etiology of her CM.  She was started on amiodarone (after discussion of options).  Given her acute CHF exacerbation, amiodarone was stopped with thoughts to try Sotalol, once acute CHF is resolved.   LABS: K+ 4.2 > 3.6 BUN/Creat 30/1.36 > 25/1.42 BNP 1409 H/H 9.8/31.9 WBC 10.6 plts 309 Trop I: <0.03 x3 TSH 2.077  Past Medical History:  Diagnosis Date  . Atrial fibrillation ([place])   . Chronic kidney disease   . Diabetes mellitus without complication ([place])   . Hx of echocardiogram    Echo (1/16):  EF 60-65%, no RWMA, Gr 1 DD, mod AI, mod MR, mild LAE  . Hyperkalemia   . Hyperlipidemia   . Hypertension   . Orthostatic hypotension   . PVC (premature ventricular contraction)   . Rheumatic fever   . Sleep apnea      Surgical History:  Past Surgical History:  Procedure Laterality Date  . KNEE SURGERY Left 2009  . OVARIAN CYST REMOVAL       Prescriptions Prior to Admission  Medication Sig Dispense Refill Last Dose  . acetaminophen (TYLENOL) 325 MG tablet Take 325 mg by mouth every 6 (six) hours as needed.  For pain   Past Month at Unknown time  . amiodarone (PACERONE) 200 MG tablet Take 2 tablets (400 mg total) twice a day for 14 days. Then reduce and take 2 tablets (400 mg total) once a day for 14 days. 84 tablet 0 11/04/2016 at Unknown time  . aspirin 81 MG tablet Take 81 mg by mouth daily.   11/04/2016 at Unknown time  . cholecalciferol (VITAMIN D) 1000 UNITS tablet Take 1,000 Units by mouth daily.    11/04/2016 at Unknown time  . denosumab (PROLIA) 60 MG/ML SOLN injection Inject 60 mg into the skin every 6 (six) months. Administer in upper arm, thigh, or abdomen     . furosemide (LASIX) 40 MG tablet Take 20 mg by mouth daily.    11/04/2016 at Unknown time  . losartan (COZAAR) 50 MG tablet Take 50 mg by mouth daily.  1 11/04/2016 at Unknown time  . metFORMIN (GLUCOPHAGE) 500 MG tablet Take 500 mg by mouth 2 (two) times daily with a meal.   11/04/2016 at Unknown time  . metoprolol tartrate (LOPRESSOR) 25 MG tablet Take 1 tablet (25 mg total) by mouth 2 (two) times daily. 60 tablet 11 11/04/2016 at 1900  . Multiple Vitamin (MULTIVITAMIN) tablet Take 1 tablet by mouth daily.   11/04/2016 at Unknown time  . omeprazole (PRILOSEC) 40 MG capsule 40mg  in am, 40mg  in pm  4 11/04/2016 at Unknown time  . ONE TOUCH ULTRA TEST test strip USE TO TEST  YOUR BLOOD SUGAR ONCE DAILY UTD  5 11/04/2016 at Unknown time  . ONETOUCH DELICA LANCETS 89F MISC USE TO CHECK YOUR BLOOD SUGAR ONCE DAILY UTD  5 11/04/2016 at Unknown time  . Polyethyl Glycol-Propyl Glycol (SYSTANE OP) Apply 2 drops to eye 2 (two) times daily as needed. For dry eyes   Past Week at Unknown time  . atorvastatin (LIPITOR) 20 MG tablet Take 1 tablet (20 mg total) by mouth daily. 30 tablet 4     Inpatient Medications:  . aspirin  81 mg Oral Daily  . atorvastatin  20 mg Oral QHS  . enoxaparin (LOVENOX) injection  30 mg Subcutaneous Q24H  . feeding supplement (ENSURE ENLIVE)  237 mL Oral BID BM  . furosemide  40 mg Intravenous Q12H  . insulin aspart  0-5 Units  Subcutaneous QHS  . insulin aspart  0-9 Units Subcutaneous TID WC  . losartan  50 mg Oral Daily  . mouth rinse  15 mL Mouth Rinse BID  . pantoprazole  40 mg Oral Daily  . sodium chloride flush  3 mL Intravenous Q12H    Allergies:  Allergies  Allergen Reactions  . Penicillins   . Ceclor [Cefaclor] Itching  . Crestor [Rosuvastatin Calcium] Nausea Only  . Epinephrine Other (See Comments)    "feels like heart is beating out of chest"  . Nsaids     Pt states she is not taking because of kidney function  . Other Other (See Comments)    FLU SHOT  . Percocet [Oxycodone-Acetaminophen] Nausea And Vomiting  . Prednisone     Dizziness, spiked blood sugar  . Lidocaine Hcl Palpitations    Social History   Social History  . Marital status: Widowed    Spouse name: N/A  . Number of children: N/A  . Years of education: N/A   Occupational History  . Not on file.   Social History Main Topics  . Smoking status: Current Every Day Smoker    Packs/day: 0.75    Years: 50.00  . Smokeless tobacco: Never Used     Comment: pt states " I'm thinking about it"  . Alcohol use No  . Drug use: No  . Sexual activity: Not on file   Other Topics Concern  . Not on file   Social History Narrative  . No narrative on file     Family History  Problem Relation Age of Onset  . Heart attack Father   . Heart disease Father   . Cancer Father   . Hypertension Father   . Hypertension Brother   . Stroke Brother   . Hypertension Brother   . Diabetes Son   . Hyperlipidemia Son   . Hypertension Son      Review of Systems: All other systems reviewed and are otherwise negative except as noted above.  Physical Exam: Vitals:   11/05/16 1200 11/05/16 1321 11/05/16 2156 11/06/16 0612  BP: 151/62 (!) 174/64 (!) 140/48 (!) 150/58  Pulse: 65 68 70 66  Resp:  18 18 18   Temp:   98 F (36.7 C) 98.1 F (36.7 C)  TempSrc:   Oral Oral  SpO2: 100% 100% 100% 98%  Weight:  133 lb 9.6 oz (60.6 kg)  130 lb 8  oz (59.2 kg)  Height:  5\' 6"  (1.676 m)      GEN- The patient is well appearing, alert and oriented x 3 today.   HEENT: normocephalic, atraumatic; sclera clear, conjunctiva pink; hearing intact; oropharynx clear; neck  supple, no JVP Lymph- no cervical lymphadenopathy Lungs- Clear to ausculation bilaterally, normal work of breathing.  No wheezes, rales, rhonchi Heart- Regular rate and rhythm, extrasystoles appreciated, no murmurs, rubs or gallops, PMI not laterally displaced GI- soft, non-tender, non-distended, bowel sounds present Extremities- no clubbing, cyanosis, trace edema MS- no significant deformity or atrophy Skin- warm and dry, no rash or lesion Psych- euthymic mood, full affect Neuro- no gross deficits observed  Labs:   Lab Results  Component Value Date   WBC 10.6 (H) 11/05/2016   HGB 9.8 (L) 11/05/2016   HCT 31.9 (L) 11/05/2016   MCV 76.7 (L) 11/05/2016   PLT 309 11/05/2016    Recent Labs Lab 11/06/16 0140  NA 133*  K 3.6  CL 97*  CO2 25  BUN 25*  CREATININE 1.42*  CALCIUM 9.1  GLUCOSE 99      Radiology/Studies:  Dg Chest 2 View Result Date: 11/06/2016 CLINICAL DATA:  Acute on chronic systolic and diastolic CHF. EXAM: CHEST  2 VIEW COMPARISON:  11/05/2016 FINDINGS: Lungs are hyperexpanded as before. The diffuse interstitial and basilar alveolar opacity has resolved in the interval. Tiny bilateral pleural effusions persist. The cardio pericardial silhouette is enlarged. Densely calcified lymph node identified AP window, stable. Bones are diffusely demineralized. Compression deformity identified in the region of the thoracolumbar junction, likely L1. Telemetry leads overlie the chest. IMPRESSION: Interval resolution of interstitial pulmonary edema with some minimal persistent bilateral pleural effusions. Electronically Signed   By: Misty Stanley M.D.   On: 11/06/2016 08:49      EKG: SR, 1st degree AVBlock, PVC's TELEMETRY: SR, frequent PVC's, multifocal TTE  10/24/16  Review of the above records today demonstrates:  - Left ventricle: Systolic function was moderately reduced. The estimated ejection fraction was in the range of 35% to 40%. Diffuse hypokinesis. Features are consistent with a pseudonormal left ventricular filling pattern, with concomitant abnormal relaxation and increased filling pressure (grade 2 diastolic dysfunction). - Aortic valve: There was mild regurgitation. - Mitral valve: There was moderate regurgitation directed centrally. Valve area by pressure half-time: 2.47 cm^2. - Left atrium: The atrium was moderately dilated. - Pericardium, extracardiac: A small pericardial effusion was identified circumferential to the heart. There was no evidence of hemodynamic compromise.  SPECT 10/24/16  There was no ST segment deviation noted during stress.  This is a low risk study.  1. No evidence for ischemia or infarction.  2. Ungated study.  3. Frequent PVCs.    Holter 10/30/16   NSR  Frequent PVCs  Her PVC burden is 29%     Assessment and Plan:   1. Acute/chronic systolic CHF     Fluid negative -2867ml     C/w primary cardiology team  2. CM     Felt to be nonischemic with negative stress test     Plans for possible [facility] today with cardiology service (decision deferred o their service)     Negative Trop x3  3. PVC's     initially treated with amiodarone (started last week)     Stopped with acute CHF exacerbation     amio level is pending, last does was 400mg  yesterday morning     May pursue Sotalol once acute exacerbation is resolved, await amio level    Signed, Tommye Standard, PA-C 11/06/2016 10:26 AM   I have seen and examined this patient with Tommye Standard.  Agree with above, note added to reflect my findings.  On exam, regular rhythm with extrasystoles, no murmurs, lungs  clear. Presented to the hospital hypertensive and in flash pulmonary edema. She was previously seen by me in clinic  with PVCs, 29%, and heart failure with an EF of approximately 40%.  She was placed on amiodarone and had taken for doses prior to her admission to the hospital. At this time, would stop her amiodarone. Would continue diuresis, as it appears that she still is short of breath and is requiring oxygen. I have discussed with her the option of ablation for her PVCs. Her PVCs do appear to be polymorphic, but there is a dominant morphology on telemetry, that appears to be coming from the outflow tracts. Due to that, we'll schedule her for PVC ablation as an outpatient.  Takari Duncombe M. Cameka Rae MD 11/06/2016 2:47 PM

## 2016-11-06 NOTE — Progress Notes (Addendum)
Initial Nutrition Assessment   INTERVENTION:  Ensure Enlive po BID, each supplement provides 350 kcal and 20 grams of protein\ Multivitamin with minerals daily  NUTRITION DIAGNOSIS:   Inadequate oral intake related to dysphagia as evidenced by per patient/family report, moderate depletions of muscle mass.   GOAL:   Patient will meet greater than or equal to 90% of their needs   MONITOR:   PO intake, Labs, Weight trends, Skin, I & O's  REASON FOR ASSESSMENT:   Malnutrition Screening Tool    ASSESSMENT:   81 y.o. Caucasian women with a PMH significant for DM, MAT, CHF, and HTN. She was brought to the ED on 11/05/16 due to acute onset SOB. Pt. Has smoked for 40 years.   Pt reports that about one year ago, she was weighing 164 lbs and she has lost weight due to an esophageal stricture. She reports eating toast and oatmeal for breakfast, peanut butter crackers and an Ensure or Boost supplement in the afternoon, and meat, veggies, and potatoes for dinner. She reports maintaining her weight the past few months. She has some short term improvement of swallow function after having esophagus stretched in November and December, but requires several more treatments.   Labs: low sodium, low chloride, low ferritin, low iron, high TIBC, low hemoglobin  Diet Order:  Diet heart healthy/carb modified Room service appropriate? Yes; Fluid consistency: Thin  Skin:  Reviewed, no issues  Last BM:  1/14  Height:   Ht Readings from Last 1 Encounters:  11/05/16 5\' 6"  (1.676 m)    Weight:   Wt Readings from Last 1 Encounters:  11/06/16 130 lb 8 oz (59.2 kg)    Ideal Body Weight:  59.09 kg  BMI:  Body mass index is 21.06 kg/m.  Estimated Nutritional Needs:   Kcal:  1450-1650  Protein:  70-80 grams  Fluid:  1.7 L/day  EDUCATION NEEDS:   No education needs identified at this time  [place], CSP, LDN Inpatient Clinical Dietitian Pager: [phone] After Hours Pager:  [phone]

## 2016-11-06 NOTE — Progress Notes (Signed)
PROGRESS NOTE  Krista Mason VOJ:[id number] DOB: May 25, 1936 DOA: 11/05/2016 PCP: Gerrit Heck, MD   LOS: 1 day   Brief Narrative: Patient is a 81 y.o. Caucasian women with a PMH significant for DM, MAT, CHF, and HTN. She was brought to the ED on 11/05/16 due to acute onset SOB. Pt. Has smoked for 40 years, current intake is about 1-2 cigarettes per day. She has never required an inhaler or been dx with COPD.  Assessment & Plan:   Acute respiratory failure with hypoxia (HCC)  - Probable etiology CHF exacerbation induced SOB. See below.   - CXR on admission showed evidence of pulmonary edema, repeat chest x-ray this morning with improvement - Today, O2 sat is 98% on 2L nasal cannula, continue O2 therapy, wean off as tolerated - Continue furosemide.    Acute on chronic combined systolic and diastolic congestive heart failure ([place]):  - Possible etiology arrhythmia induced cardiomyopathy - Echocardiogram from 10/24/16 showed LV Ef: 35-40%. Had incessant PVCs throughout the study.  - EKG on 11/05/16 showed signs of LVH, PVC, PR prolongation, and probable left atrial enlargement.   - Troponin 11/06/16 less than 0.03.  - Continue furosemide   Multifocal PVCs with Moderate mitral regurgitation:  - EKG on 11/05/16 showed signs of LVH, PVC, PR prolongation, and probable left atrial enlargement.   - Consult with cardiology.  - Continue lovenox and ASA daily, and monitoring 24/7 with telemetry.   CKD (chronic kidney disease), stage III  - sCr is 1.42 today. 2013 she was at 1.31.  - Total urine output on 11/05/16 was 800 mL.   - Continue monitor with BMP daily.    Microcytic iron deficiency anemia  - Hgb on 11/05/16 was 9.8. 2013 Hgb was 12.9.  - As of 11/05/16: iron was 10, TIBC 489, saturation ratio 2 and ferritin 8.0.  - Continue to follow with daily CBC.   - Continue protonix while on daily ASA regimen.  - start iron supplements    Uncontrolled hypertension   - BP this morning was  150/58.   - Continue losartan. sCr is 1.42 today, continue to monitor with daily BMP.   Diabetes mellitus without complication (HCC) - Glucose today was 99.   - Last HgbA1C was 6.2 on 11/05/16. - Continue with novoLOG sliding scale.   - Continue to monitor with daily glucose.   HLD (hyperlipidemia) - Continue statin.   Tobacco abuse - Recommend cessation.    DVT prophylaxis: Lovenox daily.  Code Status: Full Family Communication: Spoke with daughter and son in law Disposition Plan: TBD  Consultants:   Cardiology (general and EP)  Procedures:   None at this time.   Antimicrobials:  None   Subjective: Patient appears in pleasant mood this morning. Denies sick contacts at home and current chest discomfort.   Objective: Vitals:   11/05/16 1200 11/05/16 1321 11/05/16 2156 11/06/16 0612  BP: 151/62 (!) 174/64 (!) 140/48 (!) 150/58  Pulse: 65 68 70 66  Resp:  18 18 18   Temp:   98 F (36.7 C) 98.1 F (36.7 C)  TempSrc:   Oral Oral  SpO2: 100% 100% 100% 98%  Weight:  60.6 kg (133 lb 9.6 oz)  59.2 kg (130 lb 8 oz)  Height:  5\' 6"  (1.676 m)      Intake/Output Summary (Last 24 hours) at 11/06/16 0944 Last data filed at 11/06/16 0821  Gross per 24 hour  Intake  3 ml  Output             3100 ml  Net            -3097 ml   Filed Weights   11/05/16 0247 11/05/16 1321 11/06/16 0612  Weight: 59.9 kg (132 lb) 60.6 kg (133 lb 9.6 oz) 59.2 kg (130 lb 8 oz)    Examination: Constitutional: NAD Vitals:   11/05/16 1200 11/05/16 1321 11/05/16 2156 11/06/16 0612  BP: 151/62 (!) 174/64 (!) 140/48 (!) 150/58  Pulse: 65 68 70 66  Resp:  18 18 18   Temp:   98 F (36.7 C) 98.1 F (36.7 C)  TempSrc:   Oral Oral  SpO2: 100% 100% 100% 98%  Weight:  60.6 kg (133 lb 9.6 oz)  59.2 kg (130 lb 8 oz)  Height:  5\' 6"  (1.676 m)     Eyes: PERRLA, lids and conjunctivae normal Neck: Normal, supple, no masses, elevated JVD bilaterally.  Respiratory: Faint crackles  bilaterally. Normal respiratory effort on O2.   Cardiovascular: Regular rate and rhythm, no murmurs / rubs / gallops. B/L 1+ LE edema. 2+ pedal pulses.   Abdomen: No tenderness.   Skin: No rashes, lesions, ulcers. No induration Neurologic: CN 2-12 grossly intact. Strength 5/5 in all 4.  Psychiatric: Normal judgment and insight. Alert and oriented x 3. Normal mood.    Data Reviewed: I have personally reviewed following labs and imaging studies  CBC:  Recent Labs Lab 11/05/16 0253  WBC 10.6*  HGB 9.8*  HCT 31.9*  MCV 76.7*  PLT 960   Basic Metabolic Panel:  Recent Labs Lab 11/05/16 0253 11/06/16 0140  NA 131* 133*  K 4.2 3.6  CL 100* 97*  CO2 20* 25  GLUCOSE 253* 99  BUN 30* 25*  CREATININE 1.36* 1.42*  CALCIUM 9.5 9.1   GFR: Estimated Creatinine Clearance: 30.5 mL/min (by C-G formula based on SCr of 1.42 mg/dL (H)). Liver Function Tests: No results for input(s): AST, ALT, ALKPHOS, BILITOT, PROT, ALBUMIN in the last 168 hours. No results for input(s): LIPASE, AMYLASE in the last 168 hours. No results for input(s): AMMONIA in the last 168 hours. Coagulation Profile: No results for input(s): INR, PROTIME in the last 168 hours. Cardiac Enzymes:  Recent Labs Lab 11/05/16 1542 11/05/16 2001 11/06/16 0140  TROPONINI <0.03 <0.03 <0.03   BNP (last 3 results)  Recent Labs  10/23/16 0939  PROBNP 4,005*   HbA1C:  Recent Labs  11/05/16 0809  HGBA1C 6.2*   CBG:  Recent Labs Lab 11/05/16 0951 11/05/16 1136 11/05/16 1654 11/05/16 2147 11/06/16 0609  GLUCAP 114* 120* 102* 98 98   Lipid Profile: No results for input(s): CHOL, HDL, LDLCALC, TRIG, CHOLHDL, LDLDIRECT in the last 72 hours. Thyroid Function Tests:  Recent Labs  11/05/16 0809  TSH 2.077   Anemia Panel:  Recent Labs  11/05/16 0809  VITAMINB12 330  FOLATE 20.0  FERRITIN 8*  TIBC 489*  IRON 10*  RETICCTPCT 0.7   Urine analysis:    Component Value Date/Time   COLORURINE YELLOW  04/15/2012 1451   APPEARANCEUR HAZY (A) 04/15/2012 1451   LABSPEC 1.011 04/15/2012 1451   PHURINE 5.5 04/15/2012 1451   GLUCOSEU NEGATIVE 04/15/2012 1451   HGBUR SMALL (A) 04/15/2012 1451   BILIRUBINUR NEGATIVE 04/15/2012 1451   KETONESUR NEGATIVE 04/15/2012 1451   PROTEINUR NEGATIVE 04/15/2012 1451   UROBILINOGEN 0.2 04/15/2012 1451   NITRITE NEGATIVE 04/15/2012 1451   LEUKOCYTESUR SMALL (A) 04/15/2012 1451  Sepsis Labs: Invalid input(s): PROCALCITONIN, LACTICIDVEN  No results found for this or any previous visit (from the past 240 hour(s)).    Radiology Studies: Dg Chest 2 View  Result Date: 11/06/2016 CLINICAL DATA:  Acute on chronic systolic and diastolic CHF. EXAM: CHEST  2 VIEW COMPARISON:  11/05/2016 FINDINGS: Lungs are hyperexpanded as before. The diffuse interstitial and basilar alveolar opacity has resolved in the interval. Tiny bilateral pleural effusions persist. The cardio pericardial silhouette is enlarged. Densely calcified lymph node identified AP window, stable. Bones are diffusely demineralized. Compression deformity identified in the region of the thoracolumbar junction, likely L1. Telemetry leads overlie the chest. IMPRESSION: Interval resolution of interstitial pulmonary edema with some minimal persistent bilateral pleural effusions. Electronically Signed   By: Misty Stanley M.D.   On: 11/06/2016 08:49   Dg Chest Port 1 View  Result Date: 11/05/2016 CLINICAL DATA:  Shortness of breath. EXAM: PORTABLE CHEST 1 VIEW COMPARISON:  Radiograph 10/19/2016. CT 07/11/2016 FINDINGS: Cardiomegaly is similar. Mediastinal contours are unchanged. Atherosclerosis of the thoracic aorta. Calcified left mediastinal lymph nodes. Increased pulmonary edema from prior exam. Blunting of the costophrenic angles, right greater than left, consistent with small effusions. Again seen hyperinflation. No focal airspace disease to suggest pneumonia. No pneumothorax. IMPRESSION: Increasing pulmonary  edema, with persistent cardiomegaly and pleural effusions. Findings consistent with worsening CHF. Electronically Signed   By: Jeb Levering M.D.   On: 11/05/2016 03:41     Scheduled Meds: . aspirin  81 mg Oral Daily  . atorvastatin  20 mg Oral QHS  . enoxaparin (LOVENOX) injection  30 mg Subcutaneous Q24H  . feeding supplement (ENSURE ENLIVE)  237 mL Oral BID BM  . furosemide  40 mg Intravenous Q12H  . insulin aspart  0-5 Units Subcutaneous QHS  . insulin aspart  0-9 Units Subcutaneous TID WC  . losartan  50 mg Oral Daily  . mouth rinse  15 mL Mouth Rinse BID  . pantoprazole  40 mg Oral Daily  . sodium chloride flush  3 mL Intravenous Q12H   Continuous Infusions: None.   Rose Clousing, [place]   Attending MD note  Patient was seen, examined,treatment plan was discussed with the PA-S Rose Clousing.  I have personally reviewed the clinical findings, lab, imaging studies and management of this patient in detail. I agree with the documentation, as recorded by the PA-S.   Patient is A pleasant 81 year old female with hypertension, CKD, diabetes, with progressive CHF with worsening EF. It is believed that she has PVC burden that might cost her cardiomyopathy. She came in with fluid overload as evidenced on clinical exam as well as chest x-ray, and cardiology and electrophysiology were consulted.  On Exam: Gen. exam: Awake, alert, not in any distress Chest: Good air entry bilaterally, no rhonchi, bibasilar crackles CVS: S1-S2 regular, no murmurs, frequent PVCs  Abdomen: Soft, nontender and nondistended Neurology: Non-focal Skin: No rash or lesions  Acute respiratory failure with hypoxia ([place])  - In the setting of fluid overload given worsening ejection fraction, continue diuresis, she responded well to Lasix last night and chest x-ray this morning shows significant improvement in her pulmonary edema. We'll wean off oxygen as tolerated.  Acute on chronic combined  systolic and diastolic congestive heart failure (HCC) / PVC - Nonischemic, she recently had a stress test which is negative for reversible findings, likely cardiomyopathy induced by arrhythmia. Cardiology and EP are following. It appears that she will need a cardiac catheterization followed by possible  PVC ablation.  - Most recent 2-D echo was done on January 15 showed an EF of 35-40%.  - Continue diuresis   CKD (chronic kidney disease), stage III  - Serum creatinine appears at baseline, stable with Lasix, continue to monitor  Microcytic iron deficiency anemia  - She is iron deficient, start iron supplements, further workup as an outpatient per primary care and gastroenterology   Uncontrolled hypertension   - Stable, monitor with diuresis   Diabetes mellitus without complication ([place]) - Last HgbA1C was 6.2 on 11/05/16. Hold metformin, place patient on sliding scale   HLD (hyperlipidemia) - Continue statin.   Tobacco abuse - Recommend cessation.    Rest as above  Hubert Derstine M. Cruzita Lederer, MD [facility] [phone]  If 7PM-7AM, please contact night-coverage [email or web address] Password [facility] 11/06/2016, 9:44 AM

## 2016-11-06 NOTE — Progress Notes (Signed)
Patient had slight dizziness today. After sitting before walking it results, Otherwise states that she feels better. And walking further without shortness of breath

## 2016-11-06 NOTE — Progress Notes (Signed)
Subjective:  Feeling much better. Some mild DOE  Objective:  Temp:  [97.8 F (36.6 C)-98.4 F (36.9 C)] 98.4 F (36.9 C) (01/16 1210) Pulse Rate:  [66-79] 76 (01/16 1210) Resp:  [17-18] 17 (01/16 1210) BP: (138-159)/(48-99) 159/99 (01/16 1210) SpO2:  [98 %-100 %] 99 % (01/16 1210) Weight:  [130 lb 8 oz (59.2 kg)] 130 lb 8 oz (59.2 kg) (01/16 0612) Weight change: 1 lb 9.6 oz (0.726 kg)  Intake/Output from previous day: 01/15 0701 - 01/16 0700 In: 3 [I.V.:3] Out: 2900 [Urine:2900]  Intake/Output from this shift: Total I/O In: 443 [P.O.:440; I.V.:3] Out: 1200 [Urine:1200]  Physical Exam: General appearance: alert and no distress Neck: no adenopathy, no carotid bruit, no JVD, supple, symmetrical, trachea midline and thyroid not enlarged, symmetric, no tenderness/mass/nodules Lungs: clear to auscultation bilaterally Heart: regular rate and rhythm, S1, S2 normal, no murmur, click, rub or gallop Extremities: extremities normal, atraumatic, no cyanosis or edema  Lab Results: Results for orders placed or performed during the hospital encounter of 11/05/16 (from the past 48 hour(s))  Basic metabolic panel     Status: Abnormal   Collection Time: 11/05/16  2:53 AM  Result Value Ref Range   Sodium 131 (L) 135 - 145 mmol/L   Potassium 4.2 3.5 - 5.1 mmol/L   Chloride 100 (L) 101 - 111 mmol/L   CO2 20 (L) 22 - 32 mmol/L   Glucose, Bld 253 (H) 65 - 99 mg/dL   BUN 30 (H) 6 - 20 mg/dL   Creatinine, Ser 1.36 (H) 0.44 - 1.00 mg/dL   Calcium 9.5 8.9 - 10.3 mg/dL   GFR calc non Af Amer 36 (L) >60 mL/min   GFR calc Af Amer 42 (L) >60 mL/min    Comment: (NOTE) The eGFR has been calculated using the CKD EPI equation. This calculation has not been validated in all clinical situations. eGFR's persistently <60 mL/min signify possible Chronic Kidney Disease.    Anion gap 11 5 - 15  CBC     Status: Abnormal   Collection Time: 11/05/16  2:53 AM  Result Value Ref Range   WBC 10.6 (H)  4.0 - 10.5 K/uL   RBC 4.16 3.87 - 5.11 MIL/uL   Hemoglobin 9.8 (L) 12.0 - 15.0 g/dL   HCT 31.9 (L) 36.0 - 46.0 %   MCV 76.7 (L) 78.0 - 100.0 fL   MCH 23.6 (L) 26.0 - 34.0 pg   MCHC 30.7 30.0 - 36.0 g/dL   RDW 16.3 (H) 11.5 - 15.5 %   Platelets 309 150 - 400 K/uL  Brain natriuretic peptide     Status: Abnormal   Collection Time: 11/05/16  2:53 AM  Result Value Ref Range   B Natriuretic Peptide 1,409.7 (H) 0.0 - 100.0 pg/mL  I-stat troponin, ED     Status: None   Collection Time: 11/05/16  3:00 AM  Result Value Ref Range   Troponin i, poc 0.01 0.00 - 0.08 ng/mL   Comment 3            Comment: Due to the release kinetics of cTnI, a negative result within the first hours of the onset of symptoms does not rule out myocardial infarction with certainty. If myocardial infarction is still suspected, repeat the test at appropriate intervals.   Hemoglobin A1c     Status: Abnormal   Collection Time: 11/05/16  8:09 AM  Result Value Ref Range   Hgb A1c MFr Bld 6.2 (H) 4.8 - 5.6 %  Comment: (NOTE)         Pre-diabetes: 5.7 - 6.4         Diabetes: >6.4         Glycemic control for adults with diabetes: <7.0    Mean Plasma Glucose 131 mg/dL    Comment: (NOTE) Performed At: [facility] [place], [place] [id number] Lindon Romp MD UX:[id number]   Vitamin B12     Status: None   Collection Time: 11/05/16  8:09 AM  Result Value Ref Range   Vitamin B-12 330 180 - 914 pg/mL    Comment: (NOTE) This assay is not validated for testing neonatal or myeloproliferative syndrome specimens for Vitamin B12 levels.   Folate     Status: None   Collection Time: 11/05/16  8:09 AM  Result Value Ref Range   Folate 20.0 >5.9 ng/mL  Iron and TIBC     Status: Abnormal   Collection Time: 11/05/16  8:09 AM  Result Value Ref Range   Iron 10 (L) 28 - 170 ug/dL   TIBC 489 (H) 250 - 450 ug/dL   Saturation Ratios 2 (L) 10.4 - 31.8 %   UIBC 479 ug/dL  Ferritin     Status:  Abnormal   Collection Time: 11/05/16  8:09 AM  Result Value Ref Range   Ferritin 8 (L) 11 - 307 ng/mL  Reticulocytes     Status: Abnormal   Collection Time: 11/05/16  8:09 AM  Result Value Ref Range   Retic Ct Pct 0.7 0.4 - 3.1 %   RBC. 3.76 (L) 3.87 - 5.11 MIL/uL   Retic Count, Manual 26.3 19.0 - 186.0 K/uL  TSH     Status: None   Collection Time: 11/05/16  8:09 AM  Result Value Ref Range   TSH 2.077 0.350 - 4.500 uIU/mL    Comment: Performed by a 3rd Generation assay with a functional sensitivity of <=0.01 uIU/mL.  CBG monitoring, ED     Status: Abnormal   Collection Time: 11/05/16  9:51 AM  Result Value Ref Range   Glucose-Capillary 114 (H) 65 - 99 mg/dL   Comment 1 Notify RN    Comment 2 Document in Chart   CBG monitoring, ED     Status: Abnormal   Collection Time: 11/05/16 11:36 AM  Result Value Ref Range   Glucose-Capillary 120 (H) 65 - 99 mg/dL   Comment 1 Notify RN    Comment 2 Document in Chart   Troponin I     Status: None   Collection Time: 11/05/16  3:42 PM  Result Value Ref Range   Troponin I <0.03 <0.03 ng/mL  Glucose, capillary     Status: Abnormal   Collection Time: 11/05/16  4:54 PM  Result Value Ref Range   Glucose-Capillary 102 (H) 65 - 99 mg/dL  Troponin I     Status: None   Collection Time: 11/05/16  8:01 PM  Result Value Ref Range   Troponin I <0.03 <0.03 ng/mL  Glucose, capillary     Status: None   Collection Time: 11/05/16  9:47 PM  Result Value Ref Range   Glucose-Capillary 98 65 - 99 mg/dL   Comment 1 Notify RN    Comment 2 Document in Chart   Troponin I     Status: None   Collection Time: 11/06/16  1:40 AM  Result Value Ref Range   Troponin I <0.03 <0.03 ng/mL  Basic metabolic panel     Status: Abnormal  Collection Time: 11/06/16  1:40 AM  Result Value Ref Range   Sodium 133 (L) 135 - 145 mmol/L   Potassium 3.6 3.5 - 5.1 mmol/L   Chloride 97 (L) 101 - 111 mmol/L   CO2 25 22 - 32 mmol/L   Glucose, Bld 99 65 - 99 mg/dL   BUN 25 (H) 6  - 20 mg/dL   Creatinine, Ser 1.42 (H) 0.44 - 1.00 mg/dL   Calcium 9.1 8.9 - 10.3 mg/dL   GFR calc non Af Amer 34 (L) >60 mL/min   GFR calc Af Amer 40 (L) >60 mL/min    Comment: (NOTE) The eGFR has been calculated using the CKD EPI equation. This calculation has not been validated in all clinical situations. eGFR's persistently <60 mL/min signify possible Chronic Kidney Disease.    Anion gap 11 5 - 15  Glucose, capillary     Status: None   Collection Time: 11/06/16  6:09 AM  Result Value Ref Range   Glucose-Capillary 98 65 - 99 mg/dL   Comment 1 Notify RN    Comment 2 Document in Chart   Glucose, capillary     Status: Abnormal   Collection Time: 11/06/16 11:04 AM  Result Value Ref Range   Glucose-Capillary 170 (H) 65 - 99 mg/dL    Imaging: Imaging results have been reviewed  Tele- NSR with PVCs  Assessment/Plan:   1. Principal Problem: 2.   Acute on chronic combined systolic and diastolic congestive heart failure ([place]) 3. Active Problems: 4.   Uncontrolled hypertension 5.   Diabetes mellitus without complication ([place]) 6.   HLD (hyperlipidemia) 7.   CKD (chronic kidney disease), stage III 8.   Tobacco abuse 9.   Acute respiratory failure with hypoxia (HCC) 10.   Multifocal PVCs 11.   Moderate mitral regurgitation 12.   Acute on chronic combined systolic and diastolic CHF (congestive heart failure) ([place]) 13.   Microcytic anemia 14.   Time Spent Directly with Patient:  20 minutes  Length of Stay:  LOS: 1 day   Diuresed 3.5 L. Feeling much better. Dr Curt Bears planning elective OP PVC ablation after DC. EF 35% with non ischemic MV. SCr increased 1.24---> 1.42. Can prob transition to PO diuretics. Pt was on Amio which was DCd. Not on BB. No further recs. Will see again as OP.   Quay Burow 11/06/2016, 3:15 PM

## 2016-11-07 DIAGNOSIS — I493 Ventricular premature depolarization: Secondary | ICD-10-CM

## 2016-11-07 DIAGNOSIS — I10 Essential (primary) hypertension: Secondary | ICD-10-CM

## 2016-11-07 DIAGNOSIS — I340 Nonrheumatic mitral (valve) insufficiency: Secondary | ICD-10-CM

## 2016-11-07 DIAGNOSIS — N183 Chronic kidney disease, stage 3 (moderate): Secondary | ICD-10-CM

## 2016-11-07 LAB — GLUCOSE, CAPILLARY
GLUCOSE-CAPILLARY: 111 mg/dL — AB (ref 65–99)
GLUCOSE-CAPILLARY: 110 mg/dL — AB (ref 65–99)
Glucose-Capillary: 131 mg/dL — ABNORMAL HIGH (ref 65–99)
Glucose-Capillary: 93 mg/dL (ref 65–99)

## 2016-11-07 LAB — CBC
HEMATOCRIT: 30.6 % — AB (ref 36.0–46.0)
Hemoglobin: 9.5 g/dL — ABNORMAL LOW (ref 12.0–15.0)
MCH: 23.6 pg — ABNORMAL LOW (ref 26.0–34.0)
MCHC: 31 g/dL (ref 30.0–36.0)
MCV: 76.1 fL — AB (ref 78.0–100.0)
PLATELETS: 279 10*3/uL (ref 150–400)
RBC: 4.02 MIL/uL (ref 3.87–5.11)
RDW: 16.2 % — AB (ref 11.5–15.5)
WBC: 8.3 10*3/uL (ref 4.0–10.5)

## 2016-11-07 LAB — BASIC METABOLIC PANEL
Anion gap: 10 (ref 5–15)
BUN: 27 mg/dL — AB (ref 6–20)
CALCIUM: 9.2 mg/dL (ref 8.9–10.3)
CHLORIDE: 97 mmol/L — AB (ref 101–111)
CO2: 27 mmol/L (ref 22–32)
CREATININE: 1.47 mg/dL — AB (ref 0.44–1.00)
GFR calc non Af Amer: 33 mL/min — ABNORMAL LOW (ref >60)
GFR, EST AFRICAN AMERICAN: 38 mL/min — AB (ref >60)
Glucose, Bld: 111 mg/dL — ABNORMAL HIGH (ref 65–99)
Potassium: 3.8 mmol/L (ref 3.5–5.1)
SODIUM: 134 mmol/L — AB (ref 135–145)

## 2016-11-07 MED ORDER — GLUCERNA SHAKE PO LIQD
237.0000 mL | Freq: Three times a day (TID) | ORAL | Status: DC
  Administered 2016-11-07 – 2016-11-09 (×6): 237 mL via ORAL
  Filled 2016-11-07: qty 237

## 2016-11-07 NOTE — Progress Notes (Signed)
Patient refused bed alarm. Will continue to monitor patient. 

## 2016-11-07 NOTE — Progress Notes (Addendum)
Patient Name: Krista Mason Date of Encounter: 11/07/2016  Primary Cardiologist: Dr. Antonieta Loveless Problem List     Principal Problem:   Acute on chronic combined systolic and diastolic congestive heart failure ([place]) Active Problems:   Uncontrolled hypertension   Diabetes mellitus without complication (HCC)   HLD (hyperlipidemia)   CKD (chronic kidney disease), stage III   Tobacco abuse   Acute respiratory failure with hypoxia (HCC)   Multifocal PVCs   Moderate mitral regurgitation   Acute on chronic combined systolic and diastolic CHF (congestive heart failure) (HCC)   Microcytic anemia     Subjective   Says her breathing was worse last night, couldn't lay flat. Complains of productive cough.   Inpatient Medications    Scheduled Meds: . aspirin  81 mg Oral Daily  . atorvastatin  20 mg Oral QHS  . carvedilol  3.125 mg Oral BID WC  . enoxaparin (LOVENOX) injection  30 mg Subcutaneous Q24H  . feeding supplement (ENSURE ENLIVE)  237 mL Oral BID BM  . ferrous gluconate  324 mg Oral Q breakfast  . furosemide  40 mg Intravenous Q12H  . insulin aspart  0-5 Units Subcutaneous QHS  . insulin aspart  0-9 Units Subcutaneous TID WC  . losartan  50 mg Oral Daily  . mouth rinse  15 mL Mouth Rinse BID  . pantoprazole  40 mg Oral Daily  . sodium chloride flush  3 mL Intravenous Q12H   Continuous Infusions:  PRN Meds: sodium chloride, acetaminophen, guaiFENesin-dextromethorphan, ondansetron (ZOFRAN) IV, polyvinyl alcohol, sodium chloride flush, traMADol   Vital Signs    Vitals:   11/06/16 1210 11/06/16 1918 11/07/16 0552 11/07/16 0621  BP: (!) 159/99 (!) 159/51 (!) 142/53   Pulse: 76 65 64   Resp: 17 16    Temp: 98.4 F (36.9 C) 98.3 F (36.8 C) 98.1 F (36.7 C)   TempSrc: Oral Oral Oral   SpO2: 99% 100% 100%   Weight:    129 lb 4.8 oz (58.7 kg)  Height:        Intake/Output Summary (Last 24 hours) at 11/07/16 0909 Last data filed at 11/07/16 0739  Gross per  24 hour  Intake              706 ml  Output             2800 ml  Net            -2094 ml   Filed Weights   11/05/16 1321 11/06/16 0612 11/07/16 0621  Weight: 133 lb 9.6 oz (60.6 kg) 130 lb 8 oz (59.2 kg) 129 lb 4.8 oz (58.7 kg)    Physical Exam    GEN: Well nourished, well developed, in no acute distress.  HEENT: Grossly normal.  Neck: Supple, no JVD, carotid bruits, or masses. Cardiac: RRR, no murmurs, rubs, or gallops. No clubbing, cyanosis, edema.  Radials/DP/PT 2+ and equal bilaterally.  Respiratory:  Respirations regular and unlabored, clear to auscultation bilaterally. GI: Soft, nontender, nondistended, BS + x 4. MS: no deformity or atrophy. Skin: warm and dry, no rash. Neuro:  Strength and sensation are intact. Psych: AAOx3.  Normal affect.  Labs    CBC  Recent Labs  11/05/16 0253  WBC 10.6*  HGB 9.8*  HCT 31.9*  MCV 76.7*  PLT 767   Basic Metabolic Panel  Recent Labs  11/06/16 0140 11/07/16 0438  NA 133* 134*  K 3.6 3.8  CL 97* 97*  CO2 25 27  GLUCOSE 99 111*  BUN 25* 27*  CREATININE 1.42* 1.47*  CALCIUM 9.1 9.2   Cardiac Enzymes  Recent Labs  11/05/16 1542 11/05/16 2001 11/06/16 0140  TROPONINI <0.03 <0.03 <0.03   Hemoglobin A1C  Recent Labs  11/05/16 0809  HGBA1C 6.2*   Thyroid Function Tests  Recent Labs  11/05/16 0809  TSH 2.077    Telemetry    NSR, frequent PVC's.  - Personally Reviewed  ECG     NSR, LVH - Personally Reviewed  Radiology    Dg Chest 2 View  Result Date: 11/06/2016 CLINICAL DATA:  Acute on chronic systolic and diastolic CHF. EXAM: CHEST  2 VIEW COMPARISON:  11/05/2016 FINDINGS: Lungs are hyperexpanded as before. The diffuse interstitial and basilar alveolar opacity has resolved in the interval. Tiny bilateral pleural effusions persist. The cardio pericardial silhouette is enlarged. Densely calcified lymph node identified AP window, stable. Bones are diffusely demineralized. Compression deformity  identified in the region of the thoracolumbar junction, likely L1. Telemetry leads overlie the chest. IMPRESSION: Interval resolution of interstitial pulmonary edema with some minimal persistent bilateral pleural effusions. Electronically Signed   By: Misty Stanley M.D.   On: 11/06/2016 08:49    Cardiac Studies  Transthoracic Echocardiography 10/24/16 Study Conclusions  - Left ventricle: Systolic function was moderately reduced. The   estimated ejection fraction was in the range of 35% to 40%.   Diffuse hypokinesis. Features are consistent with a pseudonormal   left ventricular filling pattern, with concomitant abnormal   relaxation and increased filling pressure (grade 2 diastolic   dysfunction). - Aortic valve: There was mild regurgitation. - Mitral valve: There was moderate regurgitation directed   centrally. Valve area by pressure half-time: 2.47 cm^2. - Left atrium: The atrium was moderately dilated. - Pericardium, extracardiac: A small pericardial effusion was   identified circumferential to the heart. There was no evidence of   hemodynamic compromise.  Impressions:  - Incessant PVCs throughout the study. Compared to 2016 study,   there is marked reduction in LV function.    Patient Profile     Krista Mason is a 81 year old female with a past medical history of HTN, COPD, chronic renal insufficiency and cardiomyopathy. She presented with dyspnea, and had recently been referred to EP for elevated PVC burden.   Assessment & Plan    1. Acute on chronic combined systolic and diastolic CHF: Says that she had orthopnea last night, coughed most of the night. Continue IV diuresis today.   EF 35%. Continue Coreg, losartan.   2. 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   3. Cough: Patient complaining of productive cough. Lungs with rhonchi, will check CBC for leukocytosis. Chest X ray yesterday showed small infiltrates.   Signed, Arbutus Leas, NP    11/07/2016, 9:09 AM   Agree with note by Jettie Booze NP  With incresed SOB last PM agree with continuing IV lasix 24-48 hours. I/O neg 4.7 L and SCr stable. Lungs clear and no JVP or periph edema. Pt close to dry weight. Can titrate BB. 2D showed EF 35% with mod MR. Non ischemic MV.  Lorretta Harp, M.D., [place], [place] [address] [place], [place]  [address]  [phone] 11/07/2016 11:08 AM

## 2016-11-07 NOTE — Progress Notes (Signed)
PROGRESS NOTE  Krista Mason KYH:[id number] DOB: 1936/09/23 DOA: 11/05/2016 PCP: Gerrit Heck, MD   LOS: 2 days   Brief Narrative: Patient is a 81 y.o. Caucasian women with a PMH significant for DM, MAT, CHF, and HTN. She was brought to the ED on 11/05/16 due to acute onset SOB. Pt. Has smoked for 40 years, current intake is about 1-2 cigarettes per day. She has never required an inhaler or been dx with COPD.  Assessment & Plan:   Acute respiratory failure with hypoxia (HCC)  - related to CHF exacerbation.  - CXR on admission showed evidence of pulmonary edema, repeat chest x-ray this morning with improvement - Continue with IV lasix, started to have worsening dyspnea last night.  -no leukocytosis    Acute on chronic combined systolic and diastolic congestive heart failure ([place]):  - Possible etiology arrhythmia induced cardiomyopathy - Echocardiogram from 10/24/16 showed LV Ef: 35-40%. Had incessant PVCs throughout the study.  - Continue IV lasix.   Multifocal PVCs with Moderate mitral regurgitation:  -cardiology following, plan for PVC ablation outpatient.  -on coreg.   CKD (chronic kidney disease), stage III  -stable, monitor on lasix.    Microcytic iron deficiency anemia  - As of 11/05/16: iron was 10, TIBC 489, saturation ratio 2 and ferritin 8.0.  - started  iron supplements    Uncontrolled hypertension   - Continue losartan. Monitor renal function.   Diabetes mellitus without complication ([place]) - Last HgbA1C was 6.2 on 11/05/16. - Continue with SSI  HLD (hyperlipidemia) - Continue statin.   Tobacco abuse - Recommend cessation.    DVT prophylaxis: Lovenox daily.  Code Status: Full Family Communication: Spoke with patient  Disposition Plan: TBD  Consultants:   Cardiology (general and EP)  Procedures:   None at this time.   Antimicrobials:  None   Subjective: She report worsening dyspnea last night, specially when she lys down.   Objective: Vitals:   11/06/16 1210 11/06/16 1918 11/07/16 0552 11/07/16 0621  BP: (!) 159/99 (!) 159/51 (!) 142/53   Pulse: 76 65 64   Resp: 17 16    Temp: 98.4 F (36.9 C) 98.3 F (36.8 C) 98.1 F (36.7 C)   TempSrc: Oral Oral Oral   SpO2: 99% 100% 100%   Weight:    58.7 kg (129 lb 4.8 oz)  Height:        Intake/Output Summary (Last 24 hours) at 11/07/16 1101 Last data filed at 11/07/16 0900  Gross per 24 hour  Intake              943 ml  Output             2500 ml  Net            -1557 ml   Filed Weights   11/05/16 1321 11/06/16 0612 11/07/16 0621  Weight: 60.6 kg (133 lb 9.6 oz) 59.2 kg (130 lb 8 oz) 58.7 kg (129 lb 4.8 oz)    Examination: Constitutional: NAD Vitals:   11/06/16 1210 11/06/16 1918 11/07/16 0552 11/07/16 0621  BP: (!) 159/99 (!) 159/51 (!) 142/53   Pulse: 76 65 64   Resp: 17 16    Temp: 98.4 F (36.9 C) 98.3 F (36.8 C) 98.1 F (36.7 C)   TempSrc: Oral Oral Oral   SpO2: 99% 100% 100%   Weight:    58.7 kg (129 lb 4.8 oz)  Height:       Eyes: PERRLA, lids and conjunctivae normal  Neck: Normal, supple, no masses, elevated JVD bilaterally.  Respiratory: bilateral crackles bilaterally. Normal respiratory effort on O2.   Cardiovascular: Regular rate and rhythm, no murmurs / rubs / gallops. B/L 1+ LE edema. 2+ pedal pulses.   Abdomen: No tenderness.   Skin: No rashes, lesions, ulcers. No induration Neurologic: CN 2-12 grossly intact. Strength 5/5 in all 4.    Data Reviewed: I have personally reviewed following labs and imaging studies  CBC:  Recent Labs Lab 11/05/16 0253  WBC 10.6*  HGB 9.8*  HCT 31.9*  MCV 76.7*  PLT 578   Basic Metabolic Panel:  Recent Labs Lab 11/05/16 0253 11/06/16 0140 11/07/16 0438  NA 131* 133* 134*  K 4.2 3.6 3.8  CL 100* 97* 97*  CO2 20* 25 27  GLUCOSE 253* 99 111*  BUN 30* 25* 27*  CREATININE 1.36* 1.42* 1.47*  CALCIUM 9.5 9.1 9.2   GFR: Estimated Creatinine Clearance: 29.2 mL/min (by C-G formula  based on SCr of 1.47 mg/dL (H)). Liver Function Tests: No results for input(s): AST, ALT, ALKPHOS, BILITOT, PROT, ALBUMIN in the last 168 hours. No results for input(s): LIPASE, AMYLASE in the last 168 hours. No results for input(s): AMMONIA in the last 168 hours. Coagulation Profile: No results for input(s): INR, PROTIME in the last 168 hours. Cardiac Enzymes:  Recent Labs Lab 11/05/16 1542 11/05/16 2001 11/06/16 0140  TROPONINI <0.03 <0.03 <0.03   BNP (last 3 results)  Recent Labs  10/23/16 0939  PROBNP 4,005*   HbA1C:  Recent Labs  11/05/16 0809  HGBA1C 6.2*   CBG:  Recent Labs Lab 11/06/16 0609 11/06/16 1104 11/06/16 1627 11/06/16 2116 11/07/16 0601  GLUCAP 98 170* 82 107* 111*   Lipid Profile: No results for input(s): CHOL, HDL, LDLCALC, TRIG, CHOLHDL, LDLDIRECT in the last 72 hours. Thyroid Function Tests:  Recent Labs  11/05/16 0809  TSH 2.077   Anemia Panel:  Recent Labs  11/05/16 0809  VITAMINB12 330  FOLATE 20.0  FERRITIN 8*  TIBC 489*  IRON 10*  RETICCTPCT 0.7   Urine analysis:    Component Value Date/Time   COLORURINE YELLOW 04/15/2012 1451   APPEARANCEUR HAZY (A) 04/15/2012 1451   LABSPEC 1.011 04/15/2012 1451   PHURINE 5.5 04/15/2012 1451   GLUCOSEU NEGATIVE 04/15/2012 1451   HGBUR SMALL (A) 04/15/2012 1451   BILIRUBINUR NEGATIVE 04/15/2012 1451   KETONESUR NEGATIVE 04/15/2012 1451   PROTEINUR NEGATIVE 04/15/2012 1451   UROBILINOGEN 0.2 04/15/2012 1451   NITRITE NEGATIVE 04/15/2012 1451   LEUKOCYTESUR SMALL (A) 04/15/2012 1451   Sepsis Labs: Invalid input(s): PROCALCITONIN, LACTICIDVEN  No results found for this or any previous visit (from the past 240 hour(s)).    Radiology Studies: Dg Chest 2 View  Result Date: 11/06/2016 CLINICAL DATA:  Acute on chronic systolic and diastolic CHF. EXAM: CHEST  2 VIEW COMPARISON:  11/05/2016 FINDINGS: Lungs are hyperexpanded as before. The diffuse interstitial and basilar alveolar  opacity has resolved in the interval. Tiny bilateral pleural effusions persist. The cardio pericardial silhouette is enlarged. Densely calcified lymph node identified AP window, stable. Bones are diffusely demineralized. Compression deformity identified in the region of the thoracolumbar junction, likely L1. Telemetry leads overlie the chest. IMPRESSION: Interval resolution of interstitial pulmonary edema with some minimal persistent bilateral pleural effusions. Electronically Signed   By: Misty Stanley M.D.   On: 11/06/2016 08:49     Scheduled Meds: . aspirin  81 mg Oral Daily  . atorvastatin  20 mg Oral QHS  .  carvedilol  3.125 mg Oral BID WC  . enoxaparin (LOVENOX) injection  30 mg Subcutaneous Q24H  . feeding supplement (GLUCERNA SHAKE)  237 mL Oral TID BM  . ferrous gluconate  324 mg Oral Q breakfast  . furosemide  40 mg Intravenous Q12H  . insulin aspart  0-5 Units Subcutaneous QHS  . insulin aspart  0-9 Units Subcutaneous TID WC  . losartan  50 mg Oral Daily  . mouth rinse  15 mL Mouth Rinse BID  . pantoprazole  40 mg Oral Daily  . sodium chloride flush  3 mL Intravenous Q12H      Allysen Lazo, Md.  [facility] [phone]  If 7PM-7AM, please contact night-coverage [email or web address] Password [email or web address] 11/07/2016, 11:01 AM

## 2016-11-08 DIAGNOSIS — Z720 Tobacco use: Secondary | ICD-10-CM

## 2016-11-08 LAB — GLUCOSE, CAPILLARY
GLUCOSE-CAPILLARY: 123 mg/dL — AB (ref 65–99)
GLUCOSE-CAPILLARY: 120 mg/dL — AB (ref 65–99)
GLUCOSE-CAPILLARY: 162 mg/dL — AB (ref 65–99)
Glucose-Capillary: 110 mg/dL — ABNORMAL HIGH (ref 65–99)

## 2016-11-08 LAB — CBC
HEMATOCRIT: 28.9 % — AB (ref 36.0–46.0)
Hemoglobin: 9.1 g/dL — ABNORMAL LOW (ref 12.0–15.0)
MCH: 23.8 pg — AB (ref 26.0–34.0)
MCHC: 31.5 g/dL (ref 30.0–36.0)
MCV: 75.7 fL — ABNORMAL LOW (ref 78.0–100.0)
Platelets: 265 10*3/uL (ref 150–400)
RBC: 3.82 MIL/uL — ABNORMAL LOW (ref 3.87–5.11)
RDW: 16.2 % — AB (ref 11.5–15.5)
WBC: 6.2 10*3/uL (ref 4.0–10.5)

## 2016-11-08 LAB — BASIC METABOLIC PANEL
Anion gap: 11 (ref 5–15)
BUN: 30 mg/dL — AB (ref 6–20)
CO2: 27 mmol/L (ref 22–32)
Calcium: 9.6 mg/dL (ref 8.9–10.3)
Chloride: 97 mmol/L — ABNORMAL LOW (ref 101–111)
Creatinine, Ser: 1.48 mg/dL — ABNORMAL HIGH (ref 0.44–1.00)
GFR calc Af Amer: 38 mL/min — ABNORMAL LOW (ref >60)
GFR, EST NON AFRICAN AMERICAN: 33 mL/min — AB (ref >60)
GLUCOSE: 102 mg/dL — AB (ref 65–99)
POTASSIUM: 3.8 mmol/L (ref 3.5–5.1)
Sodium: 135 mmol/L (ref 135–145)

## 2016-11-08 LAB — MAGNESIUM: MAGNESIUM: 2.1 mg/dL (ref 1.7–2.4)

## 2016-11-08 MED ORDER — CARVEDILOL 6.25 MG PO TABS
6.2500 mg | ORAL_TABLET | Freq: Two times a day (BID) | ORAL | Status: DC
  Administered 2016-11-08 – 2016-11-09 (×2): 6.25 mg via ORAL
  Filled 2016-11-08 (×2): qty 1

## 2016-11-08 MED ORDER — PANTOPRAZOLE SODIUM 40 MG PO TBEC
40.0000 mg | DELAYED_RELEASE_TABLET | Freq: Two times a day (BID) | ORAL | Status: DC
  Administered 2016-11-08 – 2016-11-09 (×2): 40 mg via ORAL
  Filled 2016-11-08 (×2): qty 1

## 2016-11-08 NOTE — Progress Notes (Signed)
Coreg adjusted by Fanny Dance, Cardiology extender.  Patient updated on medication change.

## 2016-11-08 NOTE — Progress Notes (Signed)
Patient Name: Krista Mason Date of Encounter: 11/08/2016  Primary Cardiologist: Dr. Antonieta Loveless Problem List     Principal Problem:   Acute on chronic combined systolic and diastolic congestive heart failure (HCC) Active Problems:   Uncontrolled hypertension   Diabetes mellitus without complication (HCC)   HLD (hyperlipidemia)   CKD (chronic kidney disease), stage III   Tobacco abuse   Acute respiratory failure with hypoxia (HCC)   Multifocal PVCs   Moderate mitral regurgitation   Acute on chronic combined systolic and diastolic CHF (congestive heart failure) (HCC)   Microcytic anemia     Subjective   Feeling better.  Not quite to baseline.   Inpatient Medications    Scheduled Meds: . aspirin  81 mg Oral Daily  . atorvastatin  20 mg Oral QHS  . carvedilol  3.125 mg Oral BID WC  . enoxaparin (LOVENOX) injection  30 mg Subcutaneous Q24H  . feeding supplement (GLUCERNA SHAKE)  237 mL Oral TID BM  . ferrous gluconate  324 mg Oral Q breakfast  . furosemide  40 mg Intravenous Q12H  . insulin aspart  0-5 Units Subcutaneous QHS  . insulin aspart  0-9 Units Subcutaneous TID WC  . losartan  50 mg Oral Daily  . mouth rinse  15 mL Mouth Rinse BID  . pantoprazole  40 mg Oral BID  . sodium chloride flush  3 mL Intravenous Q12H   Continuous Infusions:  PRN Meds: sodium chloride, acetaminophen, guaiFENesin-dextromethorphan, ondansetron (ZOFRAN) IV, polyvinyl alcohol, sodium chloride flush, traMADol   Vital Signs    Vitals:   11/07/16 0621 11/07/16 2052 11/08/16 0609 11/08/16 0946  BP:  135/63 127/60 (!) 134/51  Pulse:  64 68 65  Resp:  18 16   Temp:  98 F (36.7 C) 98.2 F (36.8 C)   TempSrc:  Oral Oral   SpO2:  100% 100%   Weight: 129 lb 4.8 oz (58.7 kg)  127 lb 8 oz (57.8 kg)   Height:        Intake/Output Summary (Last 24 hours) at 11/08/16 1019 Last data filed at 11/08/16 0957  Gross per 24 hour  Intake              954 ml  Output             3700 ml    Net            -2746 ml   Filed Weights   11/06/16 0612 11/07/16 0621 11/08/16 0609  Weight: 130 lb 8 oz (59.2 kg) 129 lb 4.8 oz (58.7 kg) 127 lb 8 oz (57.8 kg)    Physical Exam   GEN: Well nourished, well developed, in no acute distress.  HEENT: Grossly normal.  Neck: Supple, no JVD, carotid bruits, or masses. Cardiac: RRR, soft murmur, rubs, or gallops. No clubbing, cyanosis, 1+ bilateral LE edema.  Radials/DP/PT 2+ and equal bilaterally.  Respiratory:  Respirations regular and unlabored, mild crackles as bases.[name] GI: Soft, nontender, nondistended, BS + x 4. MS: no deformity or atrophy. Skin: warm and dry, no rash. Neuro:  Strength and sensation are intact. Psych: AAOx3.  Normal affect.  Labs    CBC  Recent Labs  11/07/16 1222 11/08/16 0459  WBC 8.3 6.2  HGB 9.5* 9.1*  HCT 30.6* 28.9*  MCV 76.1* 75.7*  PLT 279 101   Basic Metabolic Panel  Recent Labs  11/07/16 0438 11/08/16 0459 11/08/16 0733  NA 134* 135  --   K  3.8 3.8  --   CL 97* 97*  --   CO2 27 27  --   GLUCOSE 111* 102*  --   BUN 27* 30*  --   CREATININE 1.47* 1.48*  --   CALCIUM 9.2 9.6  --   MG  --   --  2.1   Liver Function Tests No results for input(s): AST, ALT, ALKPHOS, BILITOT, PROT, ALBUMIN in the last 72 hours. No results for input(s): LIPASE, AMYLASE in the last 72 hours. Cardiac Enzymes  Recent Labs  11/05/16 1542 11/05/16 2001 11/06/16 0140  TROPONINI <0.03 <0.03 <0.03   BNP Invalid input(s): POCBNP D-Dimer No results for input(s): DDIMER in the last 72 hours. Hemoglobin A1C No results for input(s): HGBA1C in the last 72 hours. Fasting Lipid Panel No results for input(s): CHOL, HDL, LDLCALC, TRIG, CHOLHDL, LDLDIRECT in the last 72 hours. Thyroid Function Tests No results for input(s): TSH, T4TOTAL, T3FREE, THYROIDAB in the last 72 hours.  Invalid input(s): FREET3  Telemetry    NSR, frequent PVC's, run of 10 beat NSVT - Personally Reviewed  ECG    NSR, LVH  -  Personally Reviewed  Radiology    No results found.  Cardiac Studies   2D ECHO: 10/24/16 Study Conclusion - Left ventricle: Systolic function was moderately reduced. The estimated ejection fraction was in the range of 35% to 40%. Diffuse hypokinesis. Features are consistent with a pseudonormal left ventricular filling pattern, with concomitant abnormal relaxation and increased filling pressure (grade 2 diastolic dysfunction). - Aortic valve: There was mild regurgitation. - Mitral valve: There was moderate regurgitation directed centrally. Valve area by pressure half-time: 2.47 cm^2. - Left atrium: The atrium was moderately dilated. - Pericardium, extracardiac: A small pericardial effusion was identified circumferential to the heart. There was no evidence of hemodynamic compromise. Impressions: - Incessant PVCs throughout the study. Compared to 2016 study, there is marked reduction in LV function.   Patient Profile        [name] is a 81 year old female with a past medical history of HTN, COPD, chronic renal insufficiency, tobaco abuse and cardiomyopathy. She presented with dyspnea, and had recently been referred to EP for elevated PVC burden.    Assessment & Plan    1. Acute on chronic combined S/D CHF: Net neg 7.5L and weight down 5 lbs (132-->127). Creat stable 1.47--> 1.48. Getting close to baseline. Continue IV lasix 40mg  BID today and plan to convert to PO tomorrow.  -- EF 35%. Continue Coreg, losartan.  Will increase Coreg with PVCs and NSVT  2. 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 Had a run of NSVT (10 beats). Will increase Coreg from 3.125mg  BID to 6.25mg  BID.   3. Cough: Patient complaining of productive cough. No leukocytosis   4. Diabetes mellitus without complication: Last [id number] was 6.2 on 11/05/16. Continue with SSI per IM  5. Tobacco abuse: Recommend cessation. She is determined to quit  6. MR:  moderate by echo  Signed, Angelena Form, PA-C  11/08/2016, 10:19 AM   Agree with note by Nell Range PA-C  Close to dry weight. Still has signif DOE. Excellent diuresis (7.5 L). On IV lasix---> PO tomorrow. On BB. SCr stable. May need to be on supplemental O2 depending on Sats.   Lorretta Harp, M.D., [name], [facility], [place] [address] [place], [place]  [address]  [phone] 11/08/2016 10:58 AM

## 2016-11-08 NOTE — Progress Notes (Signed)
PROGRESS NOTE  Krista Mason QVZ:[id number] DOB: December 08, 1935 DOA: 11/05/2016 PCP: Gerrit Heck, MD   LOS: 3 days   Brief Narrative: Patient is a 81 y.o. Caucasian women with a PMH significant for DM, MAT, CHF, and HTN. She was brought to the ED on 11/05/16 due to acute onset SOB. Pt. Has smoked for 40 years, current intake is about 1-2 cigarettes per day. She has never required an inhaler or been dx with COPD.  Assessment & Plan:   Acute respiratory failure with hypoxia (HCC)  - related to CHF exacerbation.  - CXR on admission showed evidence of pulmonary edema, repeat chest x-ray this morning with improvement - Continue with IV lasix. Weight down to 127--from -133 -no leukocytosis   Need to check oxygen on ambulation.   Acute on chronic combined systolic and diastolic congestive heart failure ([place]):  - Possible etiology arrhythmia induced cardiomyopathy - Echocardiogram from 10/24/16 showed LV Ef: 35-40%. Had incessant PVCs throughout the study.  - Continue IV lasix.  Weight down to 127--from -133. Negative 8 L.  She is feeling better today, breathing better.   Multifocal PVCs with Moderate mitral regurgitation:  -cardiology following, plan for PVC ablation outpatient.  -on coreg. Increase dose today Mg normal.   CKD (chronic kidney disease), stage III  -stable, monitor on lasix.    Microcytic iron deficiency anemia  - As of 11/05/16: iron was 10, TIBC 489, saturation ratio 2 and ferritin 8.0.  - started  iron supplements    Uncontrolled hypertension   - Continue losartan. Monitor renal function.   Diabetes mellitus without complication ([place]) - Last HgbA1C was 6.2 on 11/05/16. - Continue with SSI  HLD (hyperlipidemia) - Continue statin.   Tobacco abuse - Recommend cessation.    DVT prophylaxis: Lovenox daily.  Code Status: Full Family Communication: Spoke with patient and daughter who was at bedside.  Disposition Plan: TBD  Consultants:   Cardiology  (general and EP)  Procedures:   None at this time.   Antimicrobials:  None   Subjective: She is feeling better, she was able to sleep lying down flat last night/   Objective: Vitals:   11/07/16 2052 11/08/16 0609 11/08/16 0946 11/08/16 1128  BP: 135/63 127/60 (!) 134/51 (!) 110/57  Pulse: 64 68 65 89  Resp: 18 16  18   Temp: 98 F (36.7 C) 98.2 F (36.8 C)  97.7 F (36.5 C)  TempSrc: Oral Oral  Oral  SpO2: 100% 100%  100%  Weight:  57.8 kg (127 lb 8 oz)    Height:        Intake/Output Summary (Last 24 hours) at 11/08/16 1415 Last data filed at 11/08/16 1330  Gross per 24 hour  Intake              954 ml  Output             3950 ml  Net            -2996 ml   Filed Weights   11/06/16 0612 11/07/16 0621 11/08/16 0609  Weight: 59.2 kg (130 lb 8 oz) 58.7 kg (129 lb 4.8 oz) 57.8 kg (127 lb 8 oz)    Examination: Constitutional: NAD Vitals:   11/07/16 2052 11/08/16 0609 11/08/16 0946 11/08/16 1128  BP: 135/63 127/60 (!) 134/51 (!) 110/57  Pulse: 64 68 65 89  Resp: 18 16  18   Temp: 98 F (36.7 C) 98.2 F (36.8 C)  97.7 F (36.5 C)  TempSrc: Oral  Oral  Oral  SpO2: 100% 100%  100%  Weight:  57.8 kg (127 lb 8 oz)    Height:       Eyes: PERRLA, lids and conjunctivae normal Neck: Normal, supple, no masses, elevated JVD Respiratory: CTA Normal respiratory effort on O2.   Cardiovascular: Regular rate and rhythm, no murmurs / rubs / gallops.  pedal pulses presents.   Abdomen: No tenderness.   Skin: No rashes, lesions, ulcers. No induration Neurologic: CN 2-12 grossly intact. Strength 5/5 in all 4.    Data Reviewed: I have personally reviewed following labs and imaging studies  CBC:  Recent Labs Lab 11/05/16 0253 11/07/16 1222 11/08/16 0459  WBC 10.6* 8.3 6.2  HGB 9.8* 9.5* 9.1*  HCT 31.9* 30.6* 28.9*  MCV 76.7* 76.1* 75.7*  PLT 309 279 099   Basic Metabolic Panel:  Recent Labs Lab 11/05/16 0253 11/06/16 0140 11/07/16 0438 11/08/16 0459  11/08/16 0733  NA 131* 133* 134* 135  --   K 4.2 3.6 3.8 3.8  --   CL 100* 97* 97* 97*  --   CO2 20* 25 27 27   --   GLUCOSE 253* 99 111* 102*  --   BUN 30* 25* 27* 30*  --   CREATININE 1.36* 1.42* 1.47* 1.48*  --   CALCIUM 9.5 9.1 9.2 9.6  --   MG  --   --   --   --  2.1   GFR: Estimated Creatinine Clearance: 28.6 mL/min (by C-G formula based on SCr of 1.48 mg/dL (H)). Liver Function Tests: No results for input(s): AST, ALT, ALKPHOS, BILITOT, PROT, ALBUMIN in the last 168 hours. No results for input(s): LIPASE, AMYLASE in the last 168 hours. No results for input(s): AMMONIA in the last 168 hours. Coagulation Profile: No results for input(s): INR, PROTIME in the last 168 hours. Cardiac Enzymes:  Recent Labs Lab 11/05/16 1542 11/05/16 2001 11/06/16 0140  TROPONINI <0.03 <0.03 <0.03   BNP (last 3 results)  Recent Labs  10/23/16 0939  PROBNP 4,005*   HbA1C: No results for input(s): HGBA1C in the last 72 hours. CBG:  Recent Labs Lab 11/07/16 1123 11/07/16 1702 11/07/16 2059 11/08/16 0606 11/08/16 1132  GLUCAP 131* 93 110* 110* 123*   Lipid Profile: No results for input(s): CHOL, HDL, LDLCALC, TRIG, CHOLHDL, LDLDIRECT in the last 72 hours. Thyroid Function Tests: No results for input(s): TSH, T4TOTAL, FREET4, T3FREE, THYROIDAB in the last 72 hours. Anemia Panel: No results for input(s): VITAMINB12, FOLATE, FERRITIN, TIBC, IRON, RETICCTPCT in the last 72 hours. Urine analysis:    Component Value Date/Time   COLORURINE YELLOW 04/15/2012 1451   APPEARANCEUR HAZY (A) 04/15/2012 1451   LABSPEC 1.011 04/15/2012 1451   PHURINE 5.5 04/15/2012 1451   GLUCOSEU NEGATIVE 04/15/2012 1451   HGBUR SMALL (A) 04/15/2012 1451   BILIRUBINUR NEGATIVE 04/15/2012 1451   KETONESUR NEGATIVE 04/15/2012 1451   PROTEINUR NEGATIVE 04/15/2012 1451   UROBILINOGEN 0.2 04/15/2012 1451   NITRITE NEGATIVE 04/15/2012 1451   LEUKOCYTESUR SMALL (A) 04/15/2012 1451   Sepsis Labs: Invalid  input(s): PROCALCITONIN, LACTICIDVEN  No results found for this or any previous visit (from the past 240 hour(s)).    Radiology Studies: No results found.   Scheduled Meds: . aspirin  81 mg Oral Daily  . atorvastatin  20 mg Oral QHS  . carvedilol  6.25 mg Oral BID WC  . enoxaparin (LOVENOX) injection  30 mg Subcutaneous Q24H  . feeding supplement (GLUCERNA SHAKE)  237 mL Oral  TID BM  . ferrous gluconate  324 mg Oral Q breakfast  . furosemide  40 mg Intravenous Q12H  . insulin aspart  0-5 Units Subcutaneous QHS  . insulin aspart  0-9 Units Subcutaneous TID WC  . losartan  50 mg Oral Daily  . mouth rinse  15 mL Mouth Rinse BID  . pantoprazole  40 mg Oral BID  . sodium chloride flush  3 mL Intravenous Q12H      Krista Connelley, Md.  [facility] [phone]  If 7PM-7AM, please contact night-coverage [email or web address] Password [email or web address] 11/08/2016, 2:15 PM

## 2016-11-08 NOTE — Progress Notes (Signed)
CCMD called with patient having run of bigeminy.  Patient sitting in chair and denied pain.  Skin warm and dry and patient alert and oriented.  Krista Mason with Cardiology notified via text page.

## 2016-11-08 NOTE — Progress Notes (Signed)
Krista Mason texted about 10 beats of V-Tachs tonight. No acute distress noted. No new orders given. Patient being monitered closely.

## 2016-11-09 ENCOUNTER — Telehealth: Payer: Self-pay | Admitting: Cardiology

## 2016-11-09 DIAGNOSIS — Z01812 Encounter for preprocedural laboratory examination: Secondary | ICD-10-CM

## 2016-11-09 DIAGNOSIS — I493 Ventricular premature depolarization: Secondary | ICD-10-CM

## 2016-11-09 DIAGNOSIS — J9601 Acute respiratory failure with hypoxia: Secondary | ICD-10-CM

## 2016-11-09 LAB — GLUCOSE, CAPILLARY
GLUCOSE-CAPILLARY: 169 mg/dL — AB (ref 65–99)
Glucose-Capillary: 112 mg/dL — ABNORMAL HIGH (ref 65–99)

## 2016-11-09 LAB — BASIC METABOLIC PANEL
ANION GAP: 10 (ref 5–15)
BUN: 31 mg/dL — AB (ref 6–20)
CALCIUM: 10.1 mg/dL (ref 8.9–10.3)
CO2: 29 mmol/L (ref 22–32)
Chloride: 95 mmol/L — ABNORMAL LOW (ref 101–111)
Creatinine, Ser: 1.6 mg/dL — ABNORMAL HIGH (ref 0.44–1.00)
GFR calc Af Amer: 34 mL/min — ABNORMAL LOW (ref >60)
GFR, EST NON AFRICAN AMERICAN: 30 mL/min — AB (ref >60)
GLUCOSE: 122 mg/dL — AB (ref 65–99)
POTASSIUM: 4.2 mmol/L (ref 3.5–5.1)
SODIUM: 134 mmol/L — AB (ref 135–145)

## 2016-11-09 LAB — BRAIN NATRIURETIC PEPTIDE: B NATRIURETIC PEPTIDE 5: 212.2 pg/mL — AB (ref 0.0–100.0)

## 2016-11-09 MED ORDER — FERROUS GLUCONATE 324 (38 FE) MG PO TABS: 324.0000 mg | ORAL_TABLET | Freq: Every day | ORAL | 1 refills | Status: DC

## 2016-11-09 MED ORDER — GLUCERNA SHAKE PO LIQD: 237.0000 mL | Freq: Three times a day (TID) | ORAL | 0 refills | Status: DC

## 2016-11-09 MED ORDER — FUROSEMIDE 40 MG PO TABS: 40.0000 mg | ORAL_TABLET | Freq: Every day | ORAL | 0 refills | Status: DC

## 2016-11-09 MED ORDER — CARVEDILOL 6.25 MG PO TABS: 6.2500 mg | ORAL_TABLET | Freq: Two times a day (BID) | ORAL | 0 refills | Status: DC

## 2016-11-09 NOTE — Progress Notes (Signed)
Pt refused bed alarm per Nurse Tech, will continue to monitor.

## 2016-11-09 NOTE — Care Management Important Message (Signed)
Important Message  Patient Details  Name: Krista Mason MRN: [id number] Date of Birth: Oct 12, 1936   Medicare Important Message Given:  Yes    Martinez Boxx 11/09/2016, 1:06 PM

## 2016-11-09 NOTE — Care Management Note (Signed)
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 Attending MD updated. CM talked to patient at length and informed her that if she changed her mind and felt that she needed [place] in the future her PCP can make arrangements from the office.  Expected Discharge Date:  11/09/16               Expected Discharge Plan:  Home In-House Referral:  NA  Discharge planning Services  CM Consult  Status of Service:  In process, will continue to follow  Sherrilyn Rist [phone] 11/09/2016, 1:51 PM

## 2016-11-09 NOTE — Discharge Summary (Signed)
Physician Discharge Summary  Krista Mason MHD:[id number] DOB: [birth year] DOA: 11/05/2016  PCP: Gerrit Heck, MD  Admit date: 11/05/2016 Discharge date: 11/09/2016  Admitted From: Home  Disposition:  Home   Recommendations for Outpatient Follow-up:  1. Follow up with PCP in 1-2 weeks 2. Please obtain BMP/CBC in one week 3. Follow up with Dr Acie Fredrickson and EP    Discharge Condition: Stable.  CODE STATUS: Full code.  Diet recommendation: Heart Healthy  Brief/Interim Summary: Patient is a 81 y.o. Caucasian women with a PMH significant for DM, MAT, CHF, and HTN. She was brought to the ED on 11/05/16 due to acute onset SOB. Pt. Has smoked for 40 years, current intake is about 1-2 cigarettes per day. She has never required an inhaler or been dx with COPD.  Assessment & Plan:   Acute respiratory failure with hypoxia (HCC)  - related to CHF exacerbation.  - CXR on admission showed evidence of pulmonary edema, repeat chest x-ray this morning with improvement - Received  IV lasix. Weight down to 125--from -133 -no leukocytosis   Patient will be discharge on lasix 40 mg daily.   Acute on chronic combined systolic and diastolic congestive heart failure ([place]):  - Possible etiology arrhythmia induced cardiomyopathy - Echocardiogram from 10/24/16 showed LV Ef: 35-40%. Had incessant PVCs throughout the study.  - Treated with  IV lasix 40 mg BID. She will be discharge on 40 daily. Weight down to 125--from -133. Negative 8 L.  She is feeling better today, breathing better.  cr mildly increase to 1.6. Needs b-met next week.   Multifocal PVCs with Moderate mitral regurgitation:  -cardiology following, plan for PVC ablation outpatient.  -on coreg. Doses increased 1-18  CKD (chronic kidney disease), stage III  -stable, monitor on lasix.    Microcytic iron deficiency anemia  - As of 11/05/16: iron was 10, TIBC 489, saturation ratio 2 and ferritin 8.0.  - started  iron  supplements    Uncontrolled hypertension   - Continue losartan. Monitor renal function.   Diabetes mellitus without complication ([place]) - Last HgbA1C was 6.2 on 11/05/16. - Continue with SSI -hole metformin at discharge due to elevation in cr.  HLD (hyperlipidemia) - Continue statin.   Tobacco abuse - Recommend cessation.    Discharge Diagnoses:  Principal Problem:   Acute on chronic combined systolic and diastolic congestive heart failure (HCC) Active Problems:   Uncontrolled hypertension   Diabetes mellitus without complication (HCC)   HLD (hyperlipidemia)   CKD (chronic kidney disease), stage III   Tobacco abuse   Acute respiratory failure with hypoxia (HCC)   Multifocal PVCs   Moderate mitral regurgitation   Acute on chronic combined systolic and diastolic CHF (congestive heart failure) (HCC)   Microcytic anemia    Discharge Instructions  Discharge Instructions    Diet - low sodium heart healthy    Complete by:  As directed    Increase activity slowly    Complete by:  As directed      Allergies as of 11/09/2016      Reactions   Penicillins    Ceclor [cefaclor] Itching   Crestor [rosuvastatin Calcium] Nausea Only   Epinephrine Other (See Comments)   "feels like heart is beating out of chest"   Nsaids    Pt states she is not taking because of kidney function   Other Other (See Comments)   FLU SHOT   Percocet [oxycodone-acetaminophen] Nausea And Vomiting   Prednisone    Dizziness, spiked  blood sugar   Lidocaine Hcl Palpitations      Medication List    STOP taking these medications   amiodarone 200 MG tablet Commonly known as:  PACERONE   metFORMIN 500 MG tablet Commonly known as:  GLUCOPHAGE   metoprolol tartrate 25 MG tablet Commonly known as:  LOPRESSOR     TAKE these medications   acetaminophen 325 MG tablet Commonly known as:  TYLENOL Take 325 mg by mouth every 6 (six) hours as needed. For pain   aspirin 81 MG tablet Take 81 mg by  mouth daily.   atorvastatin 20 MG tablet Commonly known as:  LIPITOR Take 1 tablet (20 mg total) by mouth daily.   carvedilol 6.25 MG tablet Commonly known as:  COREG Take 1 tablet (6.25 mg total) by mouth 2 (two) times daily with a meal.   cholecalciferol 1000 units tablet Commonly known as:  VITAMIN D Take 1,000 Units by mouth daily.   denosumab 60 MG/ML Soln injection Commonly known as:  PROLIA Inject 60 mg into the skin every 6 (six) months. Administer in upper arm, thigh, or abdomen   feeding supplement (GLUCERNA SHAKE) Liqd Take 237 mLs by mouth 3 (three) times daily between meals.   ferrous gluconate 324 MG tablet Commonly known as:  FERGON Take 1 tablet (324 mg total) by mouth daily with breakfast. Start taking on:  11/10/2016   furosemide 40 MG tablet Commonly known as:  LASIX Take 1 tablet (40 mg total) by mouth daily. What changed:  how much to take   losartan 50 MG tablet Commonly known as:  COZAAR Take 50 mg by mouth daily.   multivitamin tablet Take 1 tablet by mouth daily.   omeprazole 40 MG capsule Commonly known as:  PRILOSEC '40mg'$  in am, '40mg'$  in pm   ONE TOUCH ULTRA TEST test strip Generic drug:  glucose blood USE TO TEST YOUR BLOOD SUGAR ONCE DAILY UTD   ONETOUCH DELICA LANCETS 08Q Misc USE TO CHECK YOUR BLOOD SUGAR ONCE DAILY UTD   SYSTANE OP Apply 2 drops to eye 2 (two) times daily as needed. For dry eyes      Follow-up Information    Will [name], MD Follow up on 11/27/2016.   Specialty:  Cardiology Why:  at 2:30 pm  Contact information: [address] [place] [address] [phone]        Mertie Moores, MD Follow up in 1 week(s).   Specialty:  Cardiology Contact information: [address] [place] [address] [phone]          Allergies  Allergen Reactions  . Penicillins   . Ceclor [Cefaclor] Itching  . Crestor [Rosuvastatin Calcium] Nausea Only  . Epinephrine Other (See  Comments)    "feels like heart is beating out of chest"  . Nsaids     Pt states she is not taking because of kidney function  . Other Other (See Comments)    FLU SHOT  . Percocet [Oxycodone-Acetaminophen] Nausea And Vomiting  . Prednisone     Dizziness, spiked blood sugar  . Lidocaine Hcl Palpitations    Consultations:  Cardiology    Procedures/Studies: Dg Chest 2 View  Result Date: 11/06/2016 CLINICAL DATA:  Acute on chronic systolic and diastolic CHF. EXAM: CHEST  2 VIEW COMPARISON:  11/05/2016 FINDINGS: Lungs are hyperexpanded as before. The diffuse interstitial and basilar alveolar opacity has resolved in the interval. Tiny bilateral pleural effusions persist. The cardio pericardial silhouette is  enlarged. Densely calcified lymph node identified AP window, stable. Bones are diffusely demineralized. Compression deformity identified in the region of the thoracolumbar junction, likely L1. Telemetry leads overlie the chest. IMPRESSION: Interval resolution of interstitial pulmonary edema with some minimal persistent bilateral pleural effusions. Electronically Signed   By: Misty Stanley M.D.   On: 11/06/2016 08:49   Dg Chest Port 1 View  Result Date: 11/05/2016 CLINICAL DATA:  Shortness of breath. EXAM: PORTABLE CHEST 1 VIEW COMPARISON:  Radiograph 10/19/2016. CT 07/11/2016 FINDINGS: Cardiomegaly is similar. Mediastinal contours are unchanged. Atherosclerosis of the thoracic aorta. Calcified left mediastinal lymph nodes. Increased pulmonary edema from prior exam. Blunting of the costophrenic angles, right greater than left, consistent with small effusions. Again seen hyperinflation. No focal airspace disease to suggest pneumonia. No pneumothorax. IMPRESSION: Increasing pulmonary edema, with persistent cardiomegaly and pleural effusions. Findings consistent with worsening CHF. Electronically Signed   By: Jeb Levering M.D.   On: 11/05/2016 03:41       Subjective: She is feeling better,  denies dyspnea.   Discharge Exam: Vitals:   11/08/16 2204 11/09/16 0646  BP: (!) 119/46 (!) 120/54  Pulse: 60 66  Resp: 18 18  Temp: 97.6 F (36.4 C) 98 F (36.7 C)   Vitals:   11/08/16 1128 11/08/16 1747 11/08/16 2204 11/09/16 0646  BP: (!) 110/57  (!) 119/46 (!) 120/54  Pulse: 89 69 60 66  Resp: '18  18 18  '$ Temp: 97.7 F (36.5 C)  97.6 F (36.4 C) 98 F (36.7 C)  TempSrc: Oral  Oral Oral  SpO2: 100%  97% 100%  Weight:    56.8 kg (125 lb 4.8 oz)  Height:        General: Pt is alert, awake, not in acute distress Cardiovascular: RRR, S1/S2 +, no rubs, no gallops Respiratory: CTA bilaterally, no wheezing, no rhonchi Abdominal: Soft, NT, ND, bowel sounds + Extremities: no edema, no cyanosis    The results of significant diagnostics from this hospitalization (including imaging, microbiology, ancillary and laboratory) are listed below for reference.     Microbiology: No results found for this or any previous visit (from the past 240 hour(s)).   Labs: BNP (last 3 results)  Recent Labs  10/23/16 0939 11/05/16 0253 11/09/16 0849  BNP CANCELED 1,409.7* 262.0*   Basic Metabolic Panel:  Recent Labs Lab 11/05/16 0253 11/06/16 0140 11/07/16 0438 11/08/16 0459 11/08/16 0733 11/09/16 0638  NA 131* 133* 134* 135  --  134*  K 4.2 3.6 3.8 3.8  --  4.2  CL 100* 97* 97* 97*  --  95*  CO2 20* '25 27 27  '$ --  29  GLUCOSE 253* 99 111* 102*  --  122*  BUN 30* 25* 27* 30*  --  31*  CREATININE 1.36* 1.42* 1.47* 1.48*  --  1.60*  CALCIUM 9.5 9.1 9.2 9.6  --  10.1  MG  --   --   --   --  2.1  --    Liver Function Tests: No results for input(s): AST, ALT, ALKPHOS, BILITOT, PROT, ALBUMIN in the last 168 hours. No results for input(s): LIPASE, AMYLASE in the last 168 hours. No results for input(s): AMMONIA in the last 168 hours. CBC:  Recent Labs Lab 11/05/16 0253 11/07/16 1222 11/08/16 0459  WBC 10.6* 8.3 6.2  HGB 9.8* 9.5* 9.1*  HCT 31.9* 30.6* 28.9*  MCV 76.7*  76.1* 75.7*  PLT 309 279 265   Cardiac Enzymes:  Recent Labs Lab 11/05/16 1542  11/05/16 2001 11/06/16 0140  TROPONINI <0.03 <0.03 <0.03   BNP: Invalid input(s): POCBNP CBG:  Recent Labs Lab 11/08/16 1132 11/08/16 1638 11/08/16 2202 11/09/16 0617 11/09/16 1101  GLUCAP 123* 120* 162* 112* 169*   D-Dimer No results for input(s): DDIMER in the last 72 hours. Hgb A1c No results for input(s): HGBA1C in the last 72 hours. Lipid Profile No results for input(s): CHOL, HDL, LDLCALC, TRIG, CHOLHDL, LDLDIRECT in the last 72 hours. Thyroid function studies No results for input(s): TSH, T4TOTAL, T3FREE, THYROIDAB in the last 72 hours.  Invalid input(s): FREET3 Anemia work up No results for input(s): VITAMINB12, FOLATE, FERRITIN, TIBC, IRON, RETICCTPCT in the last 72 hours. Urinalysis    Component Value Date/Time   COLORURINE YELLOW 04/15/2012 1451   APPEARANCEUR HAZY (A) 04/15/2012 1451   LABSPEC 1.011 04/15/2012 1451   PHURINE 5.5 04/15/2012 1451   GLUCOSEU NEGATIVE 04/15/2012 1451   HGBUR SMALL (A) 04/15/2012 1451   BILIRUBINUR NEGATIVE 04/15/2012 1451   KETONESUR NEGATIVE 04/15/2012 1451   PROTEINUR NEGATIVE 04/15/2012 1451   UROBILINOGEN 0.2 04/15/2012 1451   NITRITE NEGATIVE 04/15/2012 1451   LEUKOCYTESUR SMALL (A) 04/15/2012 1451   Sepsis Labs Invalid input(s): PROCALCITONIN,  WBC,  LACTICIDVEN Microbiology No results found for this or any previous visit (from the past 240 hour(s)).   Time coordinating discharge: Over 30 minutes  SIGNED:   Elmarie Shiley, MD  [facility] 11/09/2016, 12:25 PM Pager [phone]  If 7PM-7AM, please contact night-coverage [email or web address] Password [email or web address]

## 2016-11-09 NOTE — Progress Notes (Signed)
Subjective:  Breathing much better off of O2. At dry weight.   Objective:  Temp:  [97.6 F (36.4 C)-98 F (36.7 C)] 98 F (36.7 C) (01/19 0646) Pulse Rate:  [60-89] 66 (01/19 0646) Resp:  [18] 18 (01/19 0646) BP: (110-120)/(46-57) 120/54 (01/19 0646) SpO2:  [97 %-100 %] 100 % (01/19 0646) Weight:  [125 lb 4.8 oz (56.8 kg)] 125 lb 4.8 oz (56.8 kg) (01/19 0646) Weight change: -2 lb 3.2 oz (-0.998 kg)  Intake/Output from previous day: 01/18 0701 - 01/19 0700 In: 1320 [P.O.:1317; I.V.:3] Out: 2620 [Urine:2620]  Intake/Output from this shift: Total I/O In: 240 [P.O.:240] Out: -   Physical Exam: General appearance: alert and no distress Neck: no adenopathy, no carotid bruit, no JVD, supple, symmetrical, trachea midline and thyroid not enlarged, symmetric, no tenderness/mass/nodules Lungs: clear to auscultation bilaterally Heart: regular rate and rhythm, S1, S2 normal, no murmur, click, rub or gallop Extremities: extremities normal, atraumatic, no cyanosis or edema  Lab Results: Results for orders placed or performed during the hospital encounter of 11/05/16 (from the past 48 hour(s))  Glucose, capillary     Status: Abnormal   Collection Time: 11/07/16 11:23 AM  Result Value Ref Range   Glucose-Capillary 131 (H) 65 - 99 mg/dL   Comment 1 Notify RN    Comment 2 Document in Chart   CBC     Status: Abnormal   Collection Time: 11/07/16 12:22 PM  Result Value Ref Range   WBC 8.3 4.0 - 10.5 K/uL   RBC 4.02 3.87 - 5.11 MIL/uL   Hemoglobin 9.5 (L) 12.0 - 15.0 g/dL   HCT 30.6 (L) 36.0 - 46.0 %   MCV 76.1 (L) 78.0 - 100.0 fL   MCH 23.6 (L) 26.0 - 34.0 pg   MCHC 31.0 30.0 - 36.0 g/dL   RDW 16.2 (H) 11.5 - 15.5 %   Platelets 279 150 - 400 K/uL  Glucose, capillary     Status: None   Collection Time: 11/07/16  5:02 PM  Result Value Ref Range   Glucose-Capillary 93 65 - 99 mg/dL   Comment 1 Notify RN    Comment 2 Document in Chart   Glucose, capillary     Status: Abnormal    Collection Time: 11/07/16  8:59 PM  Result Value Ref Range   Glucose-Capillary 110 (H) 65 - 99 mg/dL   Comment 1 Notify RN    Comment 2 Document in Chart   Basic metabolic panel     Status: Abnormal   Collection Time: 11/08/16  4:59 AM  Result Value Ref Range   Sodium 135 135 - 145 mmol/L   Potassium 3.8 3.5 - 5.1 mmol/L   Chloride 97 (L) 101 - 111 mmol/L   CO2 27 22 - 32 mmol/L   Glucose, Bld 102 (H) 65 - 99 mg/dL   BUN 30 (H) 6 - 20 mg/dL   Creatinine, Ser 1.48 (H) 0.44 - 1.00 mg/dL   Calcium 9.6 8.9 - 10.3 mg/dL   GFR calc non Af Amer 33 (L) >60 mL/min   GFR calc Af Amer 38 (L) >60 mL/min    Comment: (NOTE) The eGFR has been calculated using the CKD EPI equation. This calculation has not been validated in all clinical situations. eGFR's persistently <60 mL/min signify possible Chronic Kidney Disease.    Anion gap 11 5 - 15  CBC     Status: Abnormal   Collection Time: 11/08/16  4:59 AM  Result Value Ref  Range   WBC 6.2 4.0 - 10.5 K/uL   RBC 3.82 (L) 3.87 - 5.11 MIL/uL   Hemoglobin 9.1 (L) 12.0 - 15.0 g/dL   HCT 28.9 (L) 36.0 - 46.0 %   MCV 75.7 (L) 78.0 - 100.0 fL   MCH 23.8 (L) 26.0 - 34.0 pg   MCHC 31.5 30.0 - 36.0 g/dL   RDW 16.2 (H) 11.5 - 15.5 %   Platelets 265 150 - 400 K/uL  Glucose, capillary     Status: Abnormal   Collection Time: 11/08/16  6:06 AM  Result Value Ref Range   Glucose-Capillary 110 (H) 65 - 99 mg/dL  Magnesium     Status: None   Collection Time: 11/08/16  7:33 AM  Result Value Ref Range   Magnesium 2.1 1.7 - 2.4 mg/dL  Glucose, capillary     Status: Abnormal   Collection Time: 11/08/16 11:32 AM  Result Value Ref Range   Glucose-Capillary 123 (H) 65 - 99 mg/dL   Comment 1 Notify RN   Glucose, capillary     Status: Abnormal   Collection Time: 11/08/16  4:38 PM  Result Value Ref Range   Glucose-Capillary 120 (H) 65 - 99 mg/dL   Comment 1 Notify RN   Glucose, capillary     Status: Abnormal   Collection Time: 11/08/16 10:02 PM  Result  Value Ref Range   Glucose-Capillary 162 (H) 65 - 99 mg/dL   Comment 1 Notify RN    Comment 2 Document in Chart   Glucose, capillary     Status: Abnormal   Collection Time: 11/09/16  6:17 AM  Result Value Ref Range   Glucose-Capillary 112 (H) 65 - 99 mg/dL  Basic metabolic panel     Status: Abnormal   Collection Time: 11/09/16  6:38 AM  Result Value Ref Range   Sodium 134 (L) 135 - 145 mmol/L   Potassium 4.2 3.5 - 5.1 mmol/L   Chloride 95 (L) 101 - 111 mmol/L   CO2 29 22 - 32 mmol/L   Glucose, Bld 122 (H) 65 - 99 mg/dL   BUN 31 (H) 6 - 20 mg/dL   Creatinine, Ser 1.60 (H) 0.44 - 1.00 mg/dL   Calcium 10.1 8.9 - 10.3 mg/dL   GFR calc non Af Amer 30 (L) >60 mL/min   GFR calc Af Amer 34 (L) >60 mL/min    Comment: (NOTE) The eGFR has been calculated using the CKD EPI equation. This calculation has not been validated in all clinical situations. eGFR's persistently <60 mL/min signify possible Chronic Kidney Disease.    Anion gap 10 5 - 15  Brain natriuretic peptide     Status: Abnormal   Collection Time: 11/09/16  8:49 AM  Result Value Ref Range   B Natriuretic Peptide 212.2 (H) 0.0 - 100.0 pg/mL    Imaging: Imaging results have been reviewed  Tele- NSR with PVCs   Assessment/Plan:   1. Principal Problem: 2.   Acute on chronic combined systolic and diastolic congestive heart failure ([place]) 3. Active Problems: 4.   Uncontrolled hypertension 5.   Diabetes mellitus without complication ([place]) 6.   HLD (hyperlipidemia) 7.   CKD (chronic kidney disease), stage III 8.   Tobacco abuse 9.   Acute respiratory failure with hypoxia (HCC) 10.   Multifocal PVCs 11.   Moderate mitral regurgitation 12.   Acute on chronic combined systolic and diastolic CHF (congestive heart failure) ([place]) 13.   Microcytic anemia 14.   Time Spent Directly  with Patient:  25 minutes  Length of Stay:  LOS: 4 days   Prob at dry weight. I/O neg 9 L. Scr 1.48---> 1.6. BNP down 1400---> 212. Feeling much  better. Exam benign. Can prob be DCd on lasix 40 mg PO daily. OP F/U with Drs Acie Fredrickson and Camnitz.   Quay Burow 11/09/2016, 10:28 AM

## 2016-11-09 NOTE — Telephone Encounter (Signed)
New Message  Pt voiced needing nurse to follow up with her to schedule her ablation.  Please f/u

## 2016-11-09 NOTE — Telephone Encounter (Signed)
I spoke with Hans Eden, pt's daughter.  Pt discharged from [facility] 11/09/16, Dr Curt Bears saw in consultation and recommended PVC ablation.  Davy Pique is calling to schedule PVC ablation.   Sonya advised I will forward to Sherri to follow up with her on Monday when she is back in the office.

## 2016-11-12 NOTE — Telephone Encounter (Signed)
Informed dtr I would call her tomorrow to arrange procedure.

## 2016-11-13 ENCOUNTER — Encounter: Payer: Self-pay | Admitting: Cardiovascular Disease

## 2016-11-13 ENCOUNTER — Ambulatory Visit (INDEPENDENT_AMBULATORY_CARE_PROVIDER_SITE_OTHER): Payer: Medicare HMO | Admitting: Cardiovascular Disease

## 2016-11-13 ENCOUNTER — Telehealth: Payer: Self-pay | Admitting: Cardiology

## 2016-11-13 VITALS — BP 120/70 | HR 65 | Ht 66.0 in | Wt 131.0 lb

## 2016-11-13 DIAGNOSIS — R0989 Other specified symptoms and signs involving the circulatory and respiratory systems: Secondary | ICD-10-CM | POA: Diagnosis not present

## 2016-11-13 DIAGNOSIS — I5022 Chronic systolic (congestive) heart failure: Secondary | ICD-10-CM | POA: Diagnosis not present

## 2016-11-13 DIAGNOSIS — E119 Type 2 diabetes mellitus without complications: Secondary | ICD-10-CM | POA: Diagnosis not present

## 2016-11-13 DIAGNOSIS — J449 Chronic obstructive pulmonary disease, unspecified: Secondary | ICD-10-CM | POA: Diagnosis not present

## 2016-11-13 DIAGNOSIS — I5042 Chronic combined systolic (congestive) and diastolic (congestive) heart failure: Secondary | ICD-10-CM | POA: Insufficient documentation

## 2016-11-13 DIAGNOSIS — I493 Ventricular premature depolarization: Secondary | ICD-10-CM | POA: Insufficient documentation

## 2016-11-13 DIAGNOSIS — I509 Heart failure, unspecified: Secondary | ICD-10-CM | POA: Diagnosis not present

## 2016-11-13 DIAGNOSIS — H6590 Unspecified nonsuppurative otitis media, unspecified ear: Secondary | ICD-10-CM | POA: Diagnosis not present

## 2016-11-13 NOTE — Telephone Encounter (Signed)
Patient calls in c/o SOB.   Recent hospitalization (d/c 1/19) for acute respiratory failure w/ hypoxia, acute/chronic combined systolic and diastolic CHF, pulmonary edema, pleural effusions, PVCs. She reports doing ok until yesterday.  She woke up around 1:30 this morning with cough and SOB.  Cough went away when she sat up.  When she stands up she feels SOB and dizzy.  States "my legs feel like noodles". Reports weight Saturday 127.6 lb and today 128.8 lb. Pt audibly wheezing and short of breath when speaking. Appt made to see Dr. Acie Fredrickson at 10:45 this morning to assess. Pt thanks me for my help.

## 2016-11-13 NOTE — Patient Instructions (Addendum)
Medication Instructions:  Your physician recommends that you continue on your current medications as directed. Please refer to the Current Medication list given to you today.   Labwork: TODAY - basic metabolic panel, CBC    Testing/Procedures: Your physician has recommended that you have a pulmonary function test. Pulmonary Function Tests are a group of tests that measure how well air moves in and out of your lungs.  Home Oxygen has been ordered - [facility] will be in touch with you to get this set up   Follow-Up: Your physician recommends that you schedule a follow-up appointment in: 2 months with Dr. Acie Fredrickson   If you need a refill on your cardiac medications before your next appointment, please call your pharmacy.   Thank you for choosing [facility]! Christen Bame, RN [phone]

## 2016-11-13 NOTE — Telephone Encounter (Signed)
shortness of breath    Per pt she is SOB  Started about 1:30am this morning .

## 2016-11-13 NOTE — Progress Notes (Addendum)
Cardiology Office Note   Date:  11/13/2016   ID:  Krista Mason, DOB 01-17-1936, MRN [id number]  PCP:  Gerrit Heck, MD  Cardiologist:   Mertie Moores, MD   Chief Complaint  Patient presents with  . Follow-up    P roblem List 1. Hypertension 2. Hyperlipidemia 3. Diabetes mellitus 4. Chronic kidney disease 5. Multifocal atrial tachycardia 6. Chronic systolic congestive heart failure 7. Frequent premature ventricular contractions    History of Present Illness: Krista Mason is a 81 y.o. female     retired Therapist, sports with a hx of HTN, PVCs, HL, DM2, CKD, histoplasmosis, tobacco abuse. She was evaluated by Dr. Trula Slade in 2014 for PAD. ABIs: R 0.94 and L 0.73. Medical Rx was recommended at that time. I last saw me  in 2010.   she was recent seen by Nicki Reaper we are for episodes of tachycardia. Her EKG showed multifocal atrial tachycardia. This was probably related to her COPD.  The MAT has improved.  She is still taking the Diltiazem .   No CP or dyspnea.  Has had some left  knee surgery   Aug. 28, 2017,  Pt is seen for her 1 year ov. Saw Dr. Drema Dallas last week.  Was found to have tachycardia - likely ws MAT .   No CP or dyspnea Has lost some weight .  20 labs in the past year  Is scheduled to see Dr. Cristina Gong.   Jan. 23, 2018:  Krista Mason is seen today for follow up visit - with [place] and son-in law Annie Main.  She was hospitalized for increasing shortness of breath on 11/06/2016. Echocardiogram on January 3 shows moderate left ventricular dysfunction with EF of 35-40%. She has grade 2 diastolic dysfunction.  Cardiac cath was considered but was not done. She has chronic kidney disease. Her serum creatinine at baseline is 1.6.  Has quit smoking 9 days ago  O2 Sats were low when she came in - 74%. Up to 98% with O2 2 LPM.    Has occasional CP , burning , heaviness in her chest .  Also has palpitations (" flip flopping ) - has frequent PVCs No ischemia by myoview  testing on Jan. 3. 2018   Echo generator third reveals moderately depressed left ventricle systolic function with an EF of 35-40%. She has grade 2 diastolic dysfunction. She had numerous premature ventricular contractions.   Wt Readings from Last 3 Encounters:  11/13/16 131 lb (59.4 kg)  11/09/16 125 lb 4.8 oz (56.8 kg)  11/01/16 132 lb 12.8 oz (60.2 kg)       Past Medical History:  Diagnosis Date  . Atrial fibrillation ([place])   . Chronic kidney disease   . Diabetes mellitus without complication ([place])   . Hx of echocardiogram    Echo (1/16):  EF 60-65%, no RWMA, Gr 1 DD, mod AI, mod MR, mild LAE  . Hyperkalemia   . Hyperlipidemia   . Hypertension   . Orthostatic hypotension   . PVC (premature ventricular contraction)   . Rheumatic fever   . Sleep apnea     Past Surgical History:  Procedure Laterality Date  . KNEE SURGERY Left 2009  . OVARIAN CYST REMOVAL       Current Outpatient Prescriptions  Medication Sig Dispense Refill  . acetaminophen (TYLENOL) 325 MG tablet Take 325 mg by mouth every 6 (six) hours as needed. For pain    . aspirin 81 MG tablet Take 81 mg by mouth daily.    [name]  atorvastatin (LIPITOR) 20 MG tablet Take 1 tablet (20 mg total) by mouth daily. 30 tablet 4  . carvedilol (COREG) 6.25 MG tablet Take 1 tablet (6.25 mg total) by mouth 2 (two) times daily with a meal. 60 tablet 0  . cholecalciferol (VITAMIN D) 1000 UNITS tablet Take 1,000 Units by mouth daily.     [name] denosumab (PROLIA) 60 MG/ML SOLN injection Inject 60 mg into the skin every 6 (six) months. Administer in upper arm, thigh, or abdomen    . feeding supplement, GLUCERNA SHAKE, (GLUCERNA SHAKE) LIQD Take 237 mLs by mouth 3 (three) times daily between meals. 30 Can 0  . ferrous gluconate (FERGON) 324 MG tablet Take 1 tablet (324 mg total) by mouth daily with breakfast. 30 tablet 1  . furosemide (LASIX) 40 MG tablet Take 1 tablet (40 mg total) by mouth daily. 30 tablet 0  . losartan (COZAAR) 50 MG  tablet Take 50 mg by mouth daily.  1  . Multiple Vitamin (MULTIVITAMIN) tablet Take 1 tablet by mouth daily.    [name] omeprazole (PRILOSEC) 40 MG capsule 40mg  in am, 40mg  in pm  4  . ONE TOUCH ULTRA TEST test strip USE TO TEST YOUR BLOOD SUGAR ONCE DAILY UTD  5  . ONETOUCH DELICA LANCETS 76H MISC USE TO CHECK YOUR BLOOD SUGAR ONCE DAILY UTD  5  . Polyethyl Glycol-Propyl Glycol (SYSTANE OP) Apply 2 drops to eye 2 (two) times daily as needed. For dry eyes     No current facility-administered medications for this visit.     Allergies:   Penicillins; Ceclor [cefaclor]; Crestor [rosuvastatin calcium]; Epinephrine; Nsaids; Other; Percocet [oxycodone-acetaminophen]; Prednisone; and Lidocaine hcl    Social History:  The patient  reports that she has been smoking.  She has a 37.50 pack-year smoking history. She has never used smokeless tobacco. She reports that she does not drink alcohol or use drugs.   Family History:  The patient's family history includes Cancer in her father; Diabetes in her son; Heart attack in her father; Heart disease in her father; Hyperlipidemia in her son; Hypertension in her brother, brother, father, and son; Stroke in her brother.    ROS:  Please see the history of present illness.    Review of Systems: Constitutional:  denies fever, chills, diaphoresis, appetite change and fatigue.  HEENT: denies photophobia, eye pain, redness, hearing loss, ear pain, congestion, sore throat, rhinorrhea, sneezing, neck pain, neck stiffness and tinnitus.  Respiratory: admits to SOB,   Cardiovascular: denies chest pain, palpitations and leg swelling.  Gastrointestinal: denies nausea, vomiting, abdominal pain, diarrhea, constipation, blood in stool.  Genitourinary: denies dysuria, urgency, frequency, hematuria, flank pain and difficulty urinating.  Musculoskeletal: denies  myalgias, back pain, joint swelling, arthralgias and gait problem.   Skin: denies pallor, rash and wound.    Neurological: denies dizziness, seizures, syncope, weakness, light-headedness, numbness and headaches.   Hematological: denies adenopathy, easy bruising, personal or family bleeding history.  Psychiatric/ Behavioral: denies suicidal ideation, mood changes, confusion, nervousness, sleep disturbance and agitation.       All other systems are reviewed and negative.    PHYSICAL EXAM: VS:  BP 120/70   Pulse 65   Ht 5\' 6"  (1.676 m)   Wt 131 lb (59.4 kg)   SpO2 (!) 74% Comment: on room air. gave 02 - 2L came up to 99  BMI 21.14 kg/m  , BMI Body mass index is 21.14 kg/m. GEN: Well nourished, well developed, in no acute distress  HEENT:  normal  Neck: no JVD, carotid bruits, or masses Cardiac: Irreg. Irreg. ; soft systolic  murmur, rubs, or gallops,no edema  Respiratory:  clear to auscultation bilaterally, normal work of breathing GI: soft, nontender, nondistended, + BS MS: no deformity or atrophy  Skin: warm and dry, no rash Neuro:  Strength and sensation are intact Psych: normal   EKG:  EKG is ordered today.  NSR at 98.  1st degree AV block . PACs voltage criteria for LVH.     Recent Labs: 10/23/2016: NT-Pro BNP 4,005 11/05/2016: TSH 2.077 11/08/2016: Hemoglobin 9.1; Magnesium 2.1; Platelets 265 11/09/2016: B Natriuretic Peptide 212.2; BUN 31; Creatinine, Ser 1.60; Potassium 4.2; Sodium 134    Lipid Panel No results found for: CHOL, TRIG, HDL, CHOLHDL, VLDL, LDLCALC, LDLDIRECT    Wt Readings from Last 3 Encounters:  11/13/16 131 lb (59.4 kg)  11/09/16 125 lb 4.8 oz (56.8 kg)  11/01/16 132 lb 12.8 oz (60.2 kg)      Other studies Reviewed: Additional studies/ records that were reviewed today include: . Review of the above records demonstrates:    ASSESSMENT AND PLAN:  1.  Chronic systolic CHF:   Has EF 56-97 % . Likely due to PVCs Will have her see Dr. Curt Bears soon to consider ablation    O2 sats are low are baseline Decreased to 74% with exertion coming in to the  office   Will walk her on 2LPM O2. .  durable medical equipment info  O2 sats on RA with exertion 76% -  O2 sats on RA at rest - 88% O2 Sat with exertion on 2LPM - 96%    2. Probable COPD: Her chest x-ray shows hyperinflation. I suspect that she has at least moderate COPD. We will order a full set of pulmonary function tests.      Current medicines are reviewed at length with the patient today.  The patient does not have concerns regarding medicines.  The following changes have been made:  no change   Disposition:   FU with me in 2 months     Signed, Mertie Moores, MD  11/13/2016 11:31 AM    [facility] [address], [place], [place]  [address] Phone: [phone]; Fax: [phone]

## 2016-11-14 ENCOUNTER — Telehealth: Payer: Self-pay | Admitting: Cardiovascular Disease

## 2016-11-14 ENCOUNTER — Encounter: Payer: Self-pay | Admitting: Cardiology

## 2016-11-14 DIAGNOSIS — R0602 Shortness of breath: Secondary | ICD-10-CM | POA: Diagnosis not present

## 2016-11-14 DIAGNOSIS — I5022 Chronic systolic (congestive) heart failure: Secondary | ICD-10-CM | POA: Diagnosis not present

## 2016-11-14 LAB — CBC
HEMOGLOBIN: 9.4 g/dL — AB (ref 11.1–15.9)
Hematocrit: 29.2 % — ABNORMAL LOW (ref 34.0–46.6)
MCH: 23.3 pg — ABNORMAL LOW (ref 26.6–33.0)
MCHC: 32.2 g/dL (ref 31.5–35.7)
MCV: 73 fL — ABNORMAL LOW (ref 79–97)
Platelets: 290 10*3/uL (ref 150–379)
RBC: 4.03 x10E6/uL (ref 3.77–5.28)
RDW: 16.4 % — ABNORMAL HIGH (ref 12.3–15.4)
WBC: 6.6 10*3/uL (ref 3.4–10.8)

## 2016-11-14 LAB — BASIC METABOLIC PANEL
BUN/Creatinine Ratio: 21 (ref 12–28)
BUN: 28 mg/dL — ABNORMAL HIGH (ref 8–27)
CALCIUM: 8.8 mg/dL (ref 8.7–10.3)
CHLORIDE: 92 mmol/L — AB (ref 96–106)
CO2: 23 mmol/L (ref 18–29)
Creatinine, Ser: 1.35 mg/dL — ABNORMAL HIGH (ref 0.57–1.00)
GFR calc Af Amer: 43 mL/min/{1.73_m2} — ABNORMAL LOW (ref >59)
GFR calc non Af Amer: 38 mL/min/{1.73_m2} — ABNORMAL LOW (ref >59)
Glucose: 122 mg/dL — ABNORMAL HIGH (ref 65–99)
POTASSIUM: 4.1 mmol/L (ref 3.5–5.2)
Sodium: 133 mmol/L — ABNORMAL LOW (ref 134–144)

## 2016-11-14 NOTE — Telephone Encounter (Signed)
New message  Corene Cornea from [place] call requesting to speak with RN. Corene Cornea states he needs Dr. Elmarie Shiley signature for order for pts oxygen. Please call back to discuss

## 2016-11-14 NOTE — Addendum Note (Signed)
Addended by: Emmaline Life on: 11/14/2016 09:42 AM   Modules accepted: Orders

## 2016-11-14 NOTE — Telephone Encounter (Signed)
Spoke with Krista Mason at [facility] who advised that once order is signed in epic by Dr. Acie Fredrickson, he is placing rush order to get O2 delivered to patient. He states patient's daughter is aware.

## 2016-11-15 LAB — PRO B NATRIURETIC PEPTIDE: NT-Pro BNP: 4005 pg/mL — ABNORMAL HIGH (ref 0–738)

## 2016-11-15 LAB — SPECIMEN STATUS REPORT

## 2016-11-15 NOTE — Telephone Encounter (Signed)
Follow up  Pts daughter voiced to reach her on her mobile and not her work number.  [phone]

## 2016-11-16 ENCOUNTER — Telehealth: Payer: Self-pay | Admitting: Cardiology

## 2016-11-16 DIAGNOSIS — Z7982 Long term (current) use of aspirin: Secondary | ICD-10-CM | POA: Diagnosis not present

## 2016-11-16 DIAGNOSIS — E1151 Type 2 diabetes mellitus with diabetic peripheral angiopathy without gangrene: Secondary | ICD-10-CM | POA: Diagnosis not present

## 2016-11-16 DIAGNOSIS — I4891 Unspecified atrial fibrillation: Secondary | ICD-10-CM | POA: Diagnosis not present

## 2016-11-16 DIAGNOSIS — J449 Chronic obstructive pulmonary disease, unspecified: Secondary | ICD-10-CM | POA: Diagnosis not present

## 2016-11-16 DIAGNOSIS — I509 Heart failure, unspecified: Secondary | ICD-10-CM | POA: Diagnosis not present

## 2016-11-16 DIAGNOSIS — M810 Age-related osteoporosis without current pathological fracture: Secondary | ICD-10-CM | POA: Diagnosis not present

## 2016-11-16 DIAGNOSIS — I110 Hypertensive heart disease with heart failure: Secondary | ICD-10-CM | POA: Diagnosis not present

## 2016-11-16 NOTE — Telephone Encounter (Signed)
Holly (RN from [facility]) is calling because she needs to verify how often patient is supposed to use oxygen. Please call at [phone]. Thanks.

## 2016-11-16 NOTE — Telephone Encounter (Signed)
I have attempted to call this phone number twice and it is a fax machine.   I called the main number for [facility] and was transferred to [facility].  Holly asked how Dr. Acie Fredrickson wants patient to use the oxygen as the patient was unsure. Earnest Bailey states she spent 2 hours with her earlier and that the patient did not feel that she needed the O2 while she was resting, only with exertion. I advised that Dr. Elmarie Shiley concern in the office on the day the patient was here was primarily with exertion. Holly verbalized understanding and agreement and states she will notify the patient. She thanked me for the call.

## 2016-11-16 NOTE — Telephone Encounter (Signed)
dtr and I will speak next week to review procedure instructions

## 2016-11-17 DIAGNOSIS — J449 Chronic obstructive pulmonary disease, unspecified: Secondary | ICD-10-CM | POA: Diagnosis not present

## 2016-11-17 DIAGNOSIS — I4891 Unspecified atrial fibrillation: Secondary | ICD-10-CM | POA: Diagnosis not present

## 2016-11-17 DIAGNOSIS — M810 Age-related osteoporosis without current pathological fracture: Secondary | ICD-10-CM | POA: Diagnosis not present

## 2016-11-17 DIAGNOSIS — I110 Hypertensive heart disease with heart failure: Secondary | ICD-10-CM | POA: Diagnosis not present

## 2016-11-17 DIAGNOSIS — E1151 Type 2 diabetes mellitus with diabetic peripheral angiopathy without gangrene: Secondary | ICD-10-CM | POA: Diagnosis not present

## 2016-11-17 DIAGNOSIS — Z7982 Long term (current) use of aspirin: Secondary | ICD-10-CM | POA: Diagnosis not present

## 2016-11-17 DIAGNOSIS — I509 Heart failure, unspecified: Secondary | ICD-10-CM | POA: Diagnosis not present

## 2016-11-18 DIAGNOSIS — I509 Heart failure, unspecified: Secondary | ICD-10-CM | POA: Diagnosis not present

## 2016-11-18 DIAGNOSIS — J449 Chronic obstructive pulmonary disease, unspecified: Secondary | ICD-10-CM | POA: Diagnosis not present

## 2016-11-18 DIAGNOSIS — E1151 Type 2 diabetes mellitus with diabetic peripheral angiopathy without gangrene: Secondary | ICD-10-CM | POA: Diagnosis not present

## 2016-11-18 DIAGNOSIS — I4891 Unspecified atrial fibrillation: Secondary | ICD-10-CM | POA: Diagnosis not present

## 2016-11-18 DIAGNOSIS — M810 Age-related osteoporosis without current pathological fracture: Secondary | ICD-10-CM | POA: Diagnosis not present

## 2016-11-18 DIAGNOSIS — Z7982 Long term (current) use of aspirin: Secondary | ICD-10-CM | POA: Diagnosis not present

## 2016-11-18 DIAGNOSIS — I110 Hypertensive heart disease with heart failure: Secondary | ICD-10-CM | POA: Diagnosis not present

## 2016-11-20 ENCOUNTER — Encounter (HOSPITAL_COMMUNITY): Payer: Medicare HMO

## 2016-11-20 DIAGNOSIS — J449 Chronic obstructive pulmonary disease, unspecified: Secondary | ICD-10-CM | POA: Diagnosis not present

## 2016-11-20 DIAGNOSIS — Z7982 Long term (current) use of aspirin: Secondary | ICD-10-CM | POA: Diagnosis not present

## 2016-11-20 DIAGNOSIS — I4891 Unspecified atrial fibrillation: Secondary | ICD-10-CM | POA: Diagnosis not present

## 2016-11-20 DIAGNOSIS — I509 Heart failure, unspecified: Secondary | ICD-10-CM | POA: Diagnosis not present

## 2016-11-20 DIAGNOSIS — E1151 Type 2 diabetes mellitus with diabetic peripheral angiopathy without gangrene: Secondary | ICD-10-CM | POA: Diagnosis not present

## 2016-11-20 DIAGNOSIS — M810 Age-related osteoporosis without current pathological fracture: Secondary | ICD-10-CM | POA: Diagnosis not present

## 2016-11-20 DIAGNOSIS — I110 Hypertensive heart disease with heart failure: Secondary | ICD-10-CM | POA: Diagnosis not present

## 2016-11-21 DIAGNOSIS — I110 Hypertensive heart disease with heart failure: Secondary | ICD-10-CM | POA: Diagnosis not present

## 2016-11-21 DIAGNOSIS — M810 Age-related osteoporosis without current pathological fracture: Secondary | ICD-10-CM | POA: Diagnosis not present

## 2016-11-21 DIAGNOSIS — J449 Chronic obstructive pulmonary disease, unspecified: Secondary | ICD-10-CM | POA: Diagnosis not present

## 2016-11-21 DIAGNOSIS — E1151 Type 2 diabetes mellitus with diabetic peripheral angiopathy without gangrene: Secondary | ICD-10-CM | POA: Diagnosis not present

## 2016-11-21 DIAGNOSIS — Z7982 Long term (current) use of aspirin: Secondary | ICD-10-CM | POA: Diagnosis not present

## 2016-11-21 DIAGNOSIS — I509 Heart failure, unspecified: Secondary | ICD-10-CM | POA: Diagnosis not present

## 2016-11-21 DIAGNOSIS — I4891 Unspecified atrial fibrillation: Secondary | ICD-10-CM | POA: Diagnosis not present

## 2016-11-21 NOTE — Telephone Encounter (Signed)
Scheduled PVC ablation for 2/9 Pre procedure labs on 2/6 Post ablation f/u scheduled for 3/22 Letter of instructions reviewed w/ dtr and left at front desk for them to pick up next week. Patient verbalized understanding and agreeable to plan.

## 2016-11-22 ENCOUNTER — Other Ambulatory Visit: Payer: Self-pay | Admitting: Cardiology

## 2016-11-22 ENCOUNTER — Encounter: Payer: Self-pay | Admitting: *Deleted

## 2016-11-22 DIAGNOSIS — E1151 Type 2 diabetes mellitus with diabetic peripheral angiopathy without gangrene: Secondary | ICD-10-CM | POA: Diagnosis not present

## 2016-11-22 DIAGNOSIS — I4891 Unspecified atrial fibrillation: Secondary | ICD-10-CM | POA: Diagnosis not present

## 2016-11-22 DIAGNOSIS — J449 Chronic obstructive pulmonary disease, unspecified: Secondary | ICD-10-CM | POA: Diagnosis not present

## 2016-11-22 DIAGNOSIS — Z7982 Long term (current) use of aspirin: Secondary | ICD-10-CM | POA: Diagnosis not present

## 2016-11-22 DIAGNOSIS — I110 Hypertensive heart disease with heart failure: Secondary | ICD-10-CM | POA: Diagnosis not present

## 2016-11-22 DIAGNOSIS — I509 Heart failure, unspecified: Secondary | ICD-10-CM | POA: Diagnosis not present

## 2016-11-22 DIAGNOSIS — M810 Age-related osteoporosis without current pathological fracture: Secondary | ICD-10-CM | POA: Diagnosis not present

## 2016-11-23 ENCOUNTER — Ambulatory Visit: Payer: Medicare HMO | Admitting: Cardiovascular Disease

## 2016-11-23 ENCOUNTER — Telehealth: Payer: Self-pay | Admitting: Cardiology

## 2016-11-23 DIAGNOSIS — Z7982 Long term (current) use of aspirin: Secondary | ICD-10-CM | POA: Diagnosis not present

## 2016-11-23 DIAGNOSIS — I4891 Unspecified atrial fibrillation: Secondary | ICD-10-CM | POA: Diagnosis not present

## 2016-11-23 DIAGNOSIS — E1151 Type 2 diabetes mellitus with diabetic peripheral angiopathy without gangrene: Secondary | ICD-10-CM | POA: Diagnosis not present

## 2016-11-23 DIAGNOSIS — J449 Chronic obstructive pulmonary disease, unspecified: Secondary | ICD-10-CM | POA: Diagnosis not present

## 2016-11-23 DIAGNOSIS — I509 Heart failure, unspecified: Secondary | ICD-10-CM | POA: Diagnosis not present

## 2016-11-23 DIAGNOSIS — I110 Hypertensive heart disease with heart failure: Secondary | ICD-10-CM | POA: Diagnosis not present

## 2016-11-23 DIAGNOSIS — M810 Age-related osteoporosis without current pathological fracture: Secondary | ICD-10-CM | POA: Diagnosis not present

## 2016-11-23 NOTE — Telephone Encounter (Signed)
New Message  Pt voiced for nurse to give her a call.  Please f/u

## 2016-11-23 NOTE — Telephone Encounter (Signed)
HHRN is not sure that she will able to draw labs on pt and doesn't want to risk not being able to have them drawn in time.  (pt possibly dehydrated). Plan is to keep 2/6 appt here for labs and they will call if pt prefers to have them done at home.

## 2016-11-23 NOTE — Telephone Encounter (Signed)
Follow up      Pt is scheduled to have labs drawn next week.  Will it be ok for the home health nurse to draw these labs?  If yes, please call and give order

## 2016-11-26 ENCOUNTER — Telehealth: Payer: Self-pay | Admitting: Cardiovascular Disease

## 2016-11-26 DIAGNOSIS — I509 Heart failure, unspecified: Secondary | ICD-10-CM | POA: Diagnosis not present

## 2016-11-26 DIAGNOSIS — I110 Hypertensive heart disease with heart failure: Secondary | ICD-10-CM | POA: Diagnosis not present

## 2016-11-26 DIAGNOSIS — I4891 Unspecified atrial fibrillation: Secondary | ICD-10-CM | POA: Diagnosis not present

## 2016-11-26 DIAGNOSIS — Z7982 Long term (current) use of aspirin: Secondary | ICD-10-CM | POA: Diagnosis not present

## 2016-11-26 DIAGNOSIS — M810 Age-related osteoporosis without current pathological fracture: Secondary | ICD-10-CM | POA: Diagnosis not present

## 2016-11-26 DIAGNOSIS — E1151 Type 2 diabetes mellitus with diabetic peripheral angiopathy without gangrene: Secondary | ICD-10-CM | POA: Diagnosis not present

## 2016-11-26 DIAGNOSIS — J449 Chronic obstructive pulmonary disease, unspecified: Secondary | ICD-10-CM | POA: Diagnosis not present

## 2016-11-26 NOTE — Telephone Encounter (Signed)
Asking if mom would be staying overnight in the hospital post ablation -- informed to prepare for overnight stay. Dtr verbalized understanding and agreeable to plan.

## 2016-11-26 NOTE — Telephone Encounter (Signed)
New message    Pt daughter called because she has questions about the cath lab

## 2016-11-26 NOTE — Telephone Encounter (Signed)
Pt is scheduled for PVC ablation 11/30/16 with Dr Curt Bears.

## 2016-11-27 ENCOUNTER — Other Ambulatory Visit: Payer: Self-pay | Admitting: Cardiology

## 2016-11-27 ENCOUNTER — Other Ambulatory Visit: Payer: Medicare HMO | Admitting: *Deleted

## 2016-11-27 ENCOUNTER — Ambulatory Visit: Payer: Medicare HMO | Admitting: Cardiology

## 2016-11-27 DIAGNOSIS — I4891 Unspecified atrial fibrillation: Secondary | ICD-10-CM | POA: Diagnosis not present

## 2016-11-27 DIAGNOSIS — J449 Chronic obstructive pulmonary disease, unspecified: Secondary | ICD-10-CM | POA: Diagnosis not present

## 2016-11-27 DIAGNOSIS — I110 Hypertensive heart disease with heart failure: Secondary | ICD-10-CM | POA: Diagnosis not present

## 2016-11-27 DIAGNOSIS — Z01812 Encounter for preprocedural laboratory examination: Secondary | ICD-10-CM | POA: Diagnosis not present

## 2016-11-27 DIAGNOSIS — I509 Heart failure, unspecified: Secondary | ICD-10-CM | POA: Diagnosis not present

## 2016-11-27 DIAGNOSIS — I493 Ventricular premature depolarization: Secondary | ICD-10-CM

## 2016-11-27 DIAGNOSIS — Z7982 Long term (current) use of aspirin: Secondary | ICD-10-CM | POA: Diagnosis not present

## 2016-11-27 DIAGNOSIS — M810 Age-related osteoporosis without current pathological fracture: Secondary | ICD-10-CM | POA: Diagnosis not present

## 2016-11-27 DIAGNOSIS — E1151 Type 2 diabetes mellitus with diabetic peripheral angiopathy without gangrene: Secondary | ICD-10-CM | POA: Diagnosis not present

## 2016-11-28 DIAGNOSIS — Z7982 Long term (current) use of aspirin: Secondary | ICD-10-CM | POA: Diagnosis not present

## 2016-11-28 DIAGNOSIS — M810 Age-related osteoporosis without current pathological fracture: Secondary | ICD-10-CM | POA: Diagnosis not present

## 2016-11-28 DIAGNOSIS — I4891 Unspecified atrial fibrillation: Secondary | ICD-10-CM | POA: Diagnosis not present

## 2016-11-28 DIAGNOSIS — I110 Hypertensive heart disease with heart failure: Secondary | ICD-10-CM | POA: Diagnosis not present

## 2016-11-28 DIAGNOSIS — J449 Chronic obstructive pulmonary disease, unspecified: Secondary | ICD-10-CM | POA: Diagnosis not present

## 2016-11-28 DIAGNOSIS — I509 Heart failure, unspecified: Secondary | ICD-10-CM | POA: Diagnosis not present

## 2016-11-28 DIAGNOSIS — E1151 Type 2 diabetes mellitus with diabetic peripheral angiopathy without gangrene: Secondary | ICD-10-CM | POA: Diagnosis not present

## 2016-11-28 LAB — CBC WITH DIFFERENTIAL/PLATELET
BASOS: 0 %
Basophils Absolute: 0 10*3/uL (ref 0.0–0.2)
EOS (ABSOLUTE): 0.2 10*3/uL (ref 0.0–0.4)
EOS: 2 %
HEMATOCRIT: 29.6 % — AB (ref 34.0–46.6)
HEMOGLOBIN: 9.2 g/dL — AB (ref 11.1–15.9)
IMMATURE GRANS (ABS): 0 10*3/uL (ref 0.0–0.1)
IMMATURE GRANULOCYTES: 0 %
Lymphocytes Absolute: 1.7 10*3/uL (ref 0.7–3.1)
Lymphs: 21 %
MCH: 22.9 pg — ABNORMAL LOW (ref 26.6–33.0)
MCHC: 31.1 g/dL — ABNORMAL LOW (ref 31.5–35.7)
MCV: 74 fL — AB (ref 79–97)
MONOS ABS: 0.6 10*3/uL (ref 0.1–0.9)
Monocytes: 7 %
NEUTROS PCT: 70 %
Neutrophils Absolute: 5.6 10*3/uL (ref 1.4–7.0)
Platelets: 373 10*3/uL (ref 150–379)
RBC: 4.01 x10E6/uL (ref 3.77–5.28)
RDW: 17.9 % — AB (ref 12.3–15.4)
WBC: 8 10*3/uL (ref 3.4–10.8)

## 2016-11-28 LAB — BASIC METABOLIC PANEL
BUN/Creatinine Ratio: 20 (ref 12–28)
BUN: 21 mg/dL (ref 8–27)
CO2: 24 mmol/L (ref 18–29)
CREATININE: 1.04 mg/dL — AB (ref 0.57–1.00)
Calcium: 9.3 mg/dL (ref 8.7–10.3)
Chloride: 97 mmol/L (ref 96–106)
GFR calc Af Amer: 59 mL/min/{1.73_m2} — ABNORMAL LOW (ref >59)
GFR, EST NON AFRICAN AMERICAN: 52 mL/min/{1.73_m2} — AB (ref >59)
Glucose: 122 mg/dL — ABNORMAL HIGH (ref 65–99)
Potassium: 4.2 mmol/L (ref 3.5–5.2)
SODIUM: 137 mmol/L (ref 134–144)

## 2016-11-29 DIAGNOSIS — I110 Hypertensive heart disease with heart failure: Secondary | ICD-10-CM | POA: Diagnosis not present

## 2016-11-29 DIAGNOSIS — Z7982 Long term (current) use of aspirin: Secondary | ICD-10-CM | POA: Diagnosis not present

## 2016-11-29 DIAGNOSIS — I4891 Unspecified atrial fibrillation: Secondary | ICD-10-CM | POA: Diagnosis not present

## 2016-11-29 DIAGNOSIS — E1151 Type 2 diabetes mellitus with diabetic peripheral angiopathy without gangrene: Secondary | ICD-10-CM | POA: Diagnosis not present

## 2016-11-29 DIAGNOSIS — I509 Heart failure, unspecified: Secondary | ICD-10-CM | POA: Diagnosis not present

## 2016-11-29 DIAGNOSIS — J449 Chronic obstructive pulmonary disease, unspecified: Secondary | ICD-10-CM | POA: Diagnosis not present

## 2016-11-29 DIAGNOSIS — M810 Age-related osteoporosis without current pathological fracture: Secondary | ICD-10-CM | POA: Diagnosis not present

## 2016-11-30 ENCOUNTER — Encounter (HOSPITAL_COMMUNITY)
Admission: RE | Disposition: A | Discharge status: Home / Self Care | Payer: Self-pay | Source: Ambulatory Visit | Attending: Cardiology

## 2016-11-30 ENCOUNTER — Ambulatory Visit (HOSPITAL_COMMUNITY): Payer: Medicare HMO | Admitting: Anesthesiology

## 2016-11-30 ENCOUNTER — Ambulatory Visit (HOSPITAL_COMMUNITY)
Admission: RE | Admit: 2016-11-30 | Discharge: 2016-12-01 | Disposition: A | Discharge status: Home / Self Care | Payer: Medicare HMO | Source: Ambulatory Visit | Attending: Cardiology | Admitting: Cardiology

## 2016-11-30 ENCOUNTER — Encounter (HOSPITAL_COMMUNITY): Payer: Self-pay | Admitting: Certified Registered Nurse Anesthetist

## 2016-11-30 DIAGNOSIS — Z885 Allergy status to narcotic agent status: Secondary | ICD-10-CM | POA: Diagnosis not present

## 2016-11-30 DIAGNOSIS — E785 Hyperlipidemia, unspecified: Secondary | ICD-10-CM | POA: Diagnosis not present

## 2016-11-30 DIAGNOSIS — E1151 Type 2 diabetes mellitus with diabetic peripheral angiopathy without gangrene: Secondary | ICD-10-CM | POA: Insufficient documentation

## 2016-11-30 DIAGNOSIS — I428 Other cardiomyopathies: Secondary | ICD-10-CM | POA: Insufficient documentation

## 2016-11-30 DIAGNOSIS — K219 Gastro-esophageal reflux disease without esophagitis: Secondary | ICD-10-CM | POA: Diagnosis not present

## 2016-11-30 DIAGNOSIS — Z720 Tobacco use: Secondary | ICD-10-CM | POA: Diagnosis present

## 2016-11-30 DIAGNOSIS — I493 Ventricular premature depolarization: Secondary | ICD-10-CM | POA: Diagnosis present

## 2016-11-30 DIAGNOSIS — E1169 Type 2 diabetes mellitus with other specified complication: Secondary | ICD-10-CM | POA: Diagnosis present

## 2016-11-30 DIAGNOSIS — I5022 Chronic systolic (congestive) heart failure: Secondary | ICD-10-CM | POA: Diagnosis present

## 2016-11-30 DIAGNOSIS — N179 Acute kidney failure, unspecified: Secondary | ICD-10-CM | POA: Diagnosis present

## 2016-11-30 DIAGNOSIS — I739 Peripheral vascular disease, unspecified: Secondary | ICD-10-CM | POA: Diagnosis present

## 2016-11-30 DIAGNOSIS — I340 Nonrheumatic mitral (valve) insufficiency: Secondary | ICD-10-CM | POA: Diagnosis present

## 2016-11-30 DIAGNOSIS — I447 Left bundle-branch block, unspecified: Secondary | ICD-10-CM | POA: Insufficient documentation

## 2016-11-30 DIAGNOSIS — E782 Mixed hyperlipidemia: Secondary | ICD-10-CM | POA: Diagnosis present

## 2016-11-30 DIAGNOSIS — I130 Hypertensive heart and chronic kidney disease with heart failure and stage 1 through stage 4 chronic kidney disease, or unspecified chronic kidney disease: Secondary | ICD-10-CM | POA: Insufficient documentation

## 2016-11-30 DIAGNOSIS — E118 Type 2 diabetes mellitus with unspecified complications: Secondary | ICD-10-CM | POA: Diagnosis present

## 2016-11-30 DIAGNOSIS — J449 Chronic obstructive pulmonary disease, unspecified: Secondary | ICD-10-CM | POA: Diagnosis not present

## 2016-11-30 DIAGNOSIS — F17210 Nicotine dependence, cigarettes, uncomplicated: Secondary | ICD-10-CM | POA: Insufficient documentation

## 2016-11-30 DIAGNOSIS — E1122 Type 2 diabetes mellitus with diabetic chronic kidney disease: Secondary | ICD-10-CM | POA: Insufficient documentation

## 2016-11-30 DIAGNOSIS — D509 Iron deficiency anemia, unspecified: Secondary | ICD-10-CM | POA: Diagnosis present

## 2016-11-30 DIAGNOSIS — N183 Chronic kidney disease, stage 3 unspecified: Secondary | ICD-10-CM | POA: Diagnosis present

## 2016-11-30 DIAGNOSIS — Z7984 Long term (current) use of oral hypoglycemic drugs: Secondary | ICD-10-CM | POA: Insufficient documentation

## 2016-11-30 DIAGNOSIS — Z880 Allergy status to penicillin: Secondary | ICD-10-CM | POA: Diagnosis not present

## 2016-11-30 DIAGNOSIS — N184 Chronic kidney disease, stage 4 (severe): Secondary | ICD-10-CM | POA: Diagnosis present

## 2016-11-30 DIAGNOSIS — I5042 Chronic combined systolic (congestive) and diastolic (congestive) heart failure: Secondary | ICD-10-CM | POA: Diagnosis present

## 2016-11-30 HISTORY — PX: PVC ABLATION: EP1236

## 2016-11-30 HISTORY — DX: Chronic combined systolic (congestive) and diastolic (congestive) heart failure: I50.42

## 2016-11-30 LAB — POCT ACTIVATED CLOTTING TIME
ACTIVATED CLOTTING TIME: 274 s
ACTIVATED CLOTTING TIME: 296 s
ACTIVATED CLOTTING TIME: 186 s

## 2016-11-30 LAB — GLUCOSE, CAPILLARY
Glucose-Capillary: 121 mg/dL — ABNORMAL HIGH (ref 65–99)
Glucose-Capillary: 114 mg/dL — ABNORMAL HIGH (ref 65–99)
Glucose-Capillary: 148 mg/dL — ABNORMAL HIGH (ref 65–99)

## 2016-11-30 SURGERY — PVC ABLATION
Anesthesia: Monitor Anesthesia Care

## 2016-11-30 MED ORDER — FERROUS GLUCONATE 324 (38 FE) MG PO TABS
324.0000 mg | ORAL_TABLET | Freq: Every day | ORAL | Status: DC
  Administered 2016-12-01: 324 mg via ORAL
  Filled 2016-11-30: qty 1

## 2016-11-30 MED ORDER — HEPARIN SODIUM (PORCINE) 1000 UNIT/ML IJ SOLN
INTRAMUSCULAR | Status: DC | PRN
  Administered 2016-11-30: 3 mL via INTRAVENOUS
  Administered 2016-11-30: 12 mL via INTRAVENOUS
  Administered 2016-11-30: 2 mL via INTRAVENOUS

## 2016-11-30 MED ORDER — HEPARIN (PORCINE) IN NACL 2-0.9 UNIT/ML-% IJ SOLN
INTRAMUSCULAR | Status: DC | PRN
Start: 2016-11-30 — End: 2016-11-30
  Administered 2016-11-30: 11:00:00

## 2016-11-30 MED ORDER — BUPIVACAINE HCL (PF) 0.25 % IJ SOLN
INTRAMUSCULAR | Status: AC
  Filled 2016-11-30: qty 60

## 2016-11-30 MED ORDER — LOSARTAN POTASSIUM 50 MG PO TABS
50.0000 mg | ORAL_TABLET | Freq: Every day | ORAL | Status: DC
Start: 2016-12-01 — End: 2016-12-01
  Administered 2016-12-01: 50 mg via ORAL
  Filled 2016-11-30: qty 1

## 2016-11-30 MED ORDER — VITAMIN D3 25 MCG (1000 UNIT) PO TABS
1000.0000 [IU] | ORAL_TABLET | Freq: Every day | ORAL | Status: DC
  Administered 2016-12-01: 1000 [IU] via ORAL
  Filled 2016-11-30 (×4): qty 1

## 2016-11-30 MED ORDER — SODIUM CHLORIDE 0.9 % IV SOLN: 250.0000 mL | INTRAVENOUS | Status: DC | PRN

## 2016-11-30 MED ORDER — BUPIVACAINE HCL (PF) 0.25 % IJ SOLN
INTRAMUSCULAR | Status: DC | PRN
  Administered 2016-11-30: 40 mL

## 2016-11-30 MED ORDER — HEPARIN SODIUM (PORCINE) 1000 UNIT/ML IJ SOLN
INTRAMUSCULAR | Status: AC
  Filled 2016-11-30: qty 1

## 2016-11-30 MED ORDER — POLYVINYL ALCOHOL 1.4 % OP SOLN
1.0000 [drp] | Freq: Two times a day (BID) | OPHTHALMIC | Status: DC | PRN
  Filled 2016-11-30: qty 15

## 2016-11-30 MED ORDER — PROTAMINE SULFATE 10 MG/ML IV SOLN
INTRAVENOUS | Status: DC | PRN
  Administered 2016-11-30 (×4): 10 mg via INTRAVENOUS

## 2016-11-30 MED ORDER — HEPARIN (PORCINE) IN NACL 2-0.9 UNIT/ML-% IJ SOLN
INTRAMUSCULAR | Status: DC | PRN
  Administered 2016-11-30: 14:00:00

## 2016-11-30 MED ORDER — HEPARIN (PORCINE) IN NACL 2-0.9 UNIT/ML-% IJ SOLN
INTRAMUSCULAR | Status: AC
  Filled 2016-11-30: qty 500

## 2016-11-30 MED ORDER — PROPOFOL 500 MG/50ML IV EMUL
INTRAVENOUS | Status: DC | PRN
  Administered 2016-11-30: 50 ug/kg/min via INTRAVENOUS

## 2016-11-30 MED ORDER — SODIUM CHLORIDE 0.9% FLUSH: 3.0000 mL | INTRAVENOUS | Status: DC | PRN

## 2016-11-30 MED ORDER — ISOPROTERENOL HCL 0.2 MG/ML IJ SOLN
INTRAVENOUS | Status: DC | PRN
  Administered 2016-11-30: 2 ug/min via INTRAVENOUS

## 2016-11-30 MED ORDER — PANTOPRAZOLE SODIUM 40 MG PO TBEC
80.0000 mg | DELAYED_RELEASE_TABLET | Freq: Every day | ORAL | Status: DC
  Administered 2016-12-01: 80 mg via ORAL
  Filled 2016-11-30: qty 2

## 2016-11-30 MED ORDER — ADULT MULTIVITAMIN W/MINERALS CH
1.0000 | ORAL_TABLET | Freq: Every day | ORAL | Status: DC
  Administered 2016-12-01: 1 via ORAL
  Filled 2016-11-30 (×2): qty 1

## 2016-11-30 MED ORDER — ONDANSETRON HCL 4 MG/2ML IJ SOLN: 4.0000 mg | Freq: Four times a day (QID) | INTRAMUSCULAR | Status: DC | PRN

## 2016-11-30 MED ORDER — BUPIVACAINE HCL (PF) 0.25 % IJ SOLN
INTRAMUSCULAR | Status: AC
  Filled 2016-11-30: qty 30

## 2016-11-30 MED ORDER — ISOPROTERENOL HCL 0.2 MG/ML IJ SOLN
INTRAMUSCULAR | Status: AC
  Filled 2016-11-30: qty 5

## 2016-11-30 MED ORDER — CARVEDILOL 6.25 MG PO TABS
6.2500 mg | ORAL_TABLET | Freq: Two times a day (BID) | ORAL | Status: DC
  Administered 2016-11-30 – 2016-12-01 (×2): 6.25 mg via ORAL
  Filled 2016-11-30 (×2): qty 1

## 2016-11-30 MED ORDER — BENZONATATE 100 MG PO CAPS
100.0000 mg | ORAL_CAPSULE | Freq: Two times a day (BID) | ORAL | Status: DC | PRN
  Administered 2016-11-30: 100 mg via ORAL
  Filled 2016-11-30: qty 1

## 2016-11-30 MED ORDER — DENOSUMAB 60 MG/ML ~~LOC~~ SOLN: 60.0000 mg | SUBCUTANEOUS | Status: DC

## 2016-11-30 MED ORDER — ATORVASTATIN CALCIUM 20 MG PO TABS
20.0000 mg | ORAL_TABLET | Freq: Every day | ORAL | Status: DC
  Filled 2016-11-30: qty 1

## 2016-11-30 MED ORDER — PHENYLEPHRINE HCL 10 MG/ML IJ SOLN
INTRAMUSCULAR | Status: DC | PRN
  Administered 2016-11-30: 120 ug via INTRAVENOUS

## 2016-11-30 MED ORDER — FUROSEMIDE 40 MG PO TABS
40.0000 mg | ORAL_TABLET | Freq: Every day | ORAL | Status: DC
  Administered 2016-11-30 – 2016-12-01 (×2): 40 mg via ORAL
  Filled 2016-11-30 (×2): qty 1

## 2016-11-30 MED ORDER — ACETAMINOPHEN 325 MG PO TABS: 325.0000 mg | ORAL_TABLET | Freq: Four times a day (QID) | ORAL | Status: DC | PRN

## 2016-11-30 MED ORDER — POLYETHYL GLYCOL-PROPYL GLYCOL 0.4-0.3 % OP GEL
Freq: Two times a day (BID) | OPHTHALMIC | Status: DC | PRN
  Filled 2016-11-30: qty 10

## 2016-11-30 MED ORDER — SODIUM CHLORIDE 0.9 % IV SOLN
INTRAVENOUS | Status: DC | PRN
  Administered 2016-11-30: 11:00:00 via INTRAVENOUS

## 2016-11-30 MED ORDER — HEPARIN SODIUM (PORCINE) 1000 UNIT/ML IJ SOLN
INTRAMUSCULAR | Status: DC | PRN
  Administered 2016-11-30: 1000 [IU] via INTRAVENOUS

## 2016-11-30 MED ORDER — SODIUM CHLORIDE 0.9% FLUSH
3.0000 mL | Freq: Two times a day (BID) | INTRAVENOUS | Status: DC
  Administered 2016-11-30 – 2016-12-01 (×2): 3 mL via INTRAVENOUS

## 2016-11-30 SURGICAL SUPPLY — 20 items
BAG SNAP BAND KOVER 36X36 (MISCELLANEOUS) ×3 IMPLANT
BLANKET WARM UNDERBOD FULL ACC (MISCELLANEOUS) ×3 IMPLANT
CATH JOSEPH QUAD ALLRED 6F REP (CATHETERS) ×3 IMPLANT
CATH SMTCH THERMOCOOL SF DF (CATHETERS) ×3 IMPLANT
CATH SOUNDSTAR 3D IMAGING (CATHETERS) ×3 IMPLANT
CATH WEBSTER BI DIR CS D-F CRV (CATHETERS) ×3 IMPLANT
COVER SWIFTLINK CONNECTOR (BAG) ×3 IMPLANT
NEEDLE TRANSSEPTAL BRK 98CM (NEEDLE) ×3 IMPLANT
PACK EP LATEX FREE (CUSTOM PROCEDURE TRAY) ×2
PACK EP LF (CUSTOM PROCEDURE TRAY) ×1 IMPLANT
PAD DEFIB LIFELINK (PAD) ×3 IMPLANT
PATCH CARTO3 (PAD) ×3 IMPLANT
SHEATH AGILIS NXT 8.5F 71CM (SHEATH) ×3 IMPLANT
SHEATH AVANTI 11F 11CM (SHEATH) ×3 IMPLANT
SHEATH PINNACLE 6F 10CM (SHEATH) ×3 IMPLANT
SHEATH PINNACLE 8F 10CM (SHEATH) ×6 IMPLANT
SHEATH PINNACLE 9F 10CM (SHEATH) ×3 IMPLANT
SHIELD RADPAD SCOOP 12X17 (MISCELLANEOUS) ×3 IMPLANT
TUBING SMART ABLATE COOLFLOW (TUBING) ×3 IMPLANT
WIRE HI TORQ VERSACORE-J 145CM (WIRE) ×3 IMPLANT

## 2016-11-30 NOTE — Progress Notes (Addendum)
Site area: Right groin a 6 and 8 french venous sheath was removed  Site Prior to Removal:  Level 0  Pressure Applied For 15 MINUTES    Bedrest Beginning at 1620p  Manual:   Yes.    Patient Status During Pull:  stable  Post Pull Groin Site:  Level 0  Post Pull Instructions Given:  Yes.    Post Pull Pulses Present:  Yes.    Dressing Applied:  Yes.  Pressure dressing applied  Comments:  VS remain stable during sheath pull

## 2016-11-30 NOTE — Anesthesia Procedure Notes (Signed)
Procedure Name: MAC Date/Time: 11/30/2016 11:11 AM Performed by: Carney Living Pre-anesthesia Checklist: Patient identified, Emergency Drugs available, Suction available, Patient being monitored and Timeout performed Patient Re-evaluated:Patient Re-evaluated prior to inductionOxygen Delivery Method: Simple face mask

## 2016-11-30 NOTE — Progress Notes (Addendum)
Site area: Left groin a 8 and 11 french venous sheath was removed  Site Prior to Removal:  Level 0  Pressure Applied For 15 MINUTES    Bedrest Beginning at 1620p  Manual:   Yes.    Patient Status During Pull:  stable  Post Pull Groin Site:  Level 0  Post Pull Instructions Given:  Yes.    Post Pull Pulses Present:  Yes.    Dressing Applied:  Yes.  Pressure dressing applied  Comments:  VS remain stable

## 2016-11-30 NOTE — H&P (Signed)
Krista Mason is a 81 y.o. female with a history of PVCs and a nonischemic cardiomyopathy possibly caused by her PVC burden. She presents today for ablation of her PVCs. On exam, irregular rhythm, no murmurs, lungs clear. Risks and benefits of the procedure were explained. Risks include but not limited to bleeding, tamponade, heart block, and stroke, among others. She understands the risks and has agreed to the procedure. Of note, she has had multifocal PVCs in the past, Margreat Widener work to target the dominant morphology.  Abubakr Wieman Curt Bears, MD 11/30/2016 10:28 AM

## 2016-11-30 NOTE — Transfer of Care (Signed)
Immediate Anesthesia Transfer of Care Note  Patient: Chava C Lien  Procedure(s) Performed: Procedure(s): PVC Ablation (N/A)  Patient Location: Cath Lab  Anesthesia Type:MAC  Level of Consciousness: awake, sedated, patient cooperative and responds to stimulation  Airway & Oxygen Therapy: Patient Spontanous Breathing and Patient connected to face mask oxygen  Post-op Assessment: Report given to RN and Post -op Vital signs reviewed and stable  Post vital signs: Reviewed and stable  Last Vitals:  Vitals:   11/30/16 0827  BP: (!) 162/67  Pulse: 66  Resp: 18  Temp: 37 C    Last Pain:  Vitals:   11/30/16 0907  TempSrc: Oral      Patients Stated Pain Goal: 5 (28/41/32 4401)  Complications: No apparent anesthesia complications

## 2016-11-30 NOTE — Anesthesia Preprocedure Evaluation (Addendum)
Anesthesia Evaluation  Patient identified by MRN, date of birth, ID band Patient awake    Reviewed: Allergy & Precautions, NPO status , Patient's Chart, lab work & pertinent test results, reviewed documented beta blocker date and time   History of Anesthesia Complications Negative for: history of anesthetic complications  Airway Mallampati: II  TM Distance: >3 FB Neck ROM: Full    Dental  (+) Dental Advisory Given, Upper Dentures, Lower Dentures, Implants   Pulmonary shortness of breath and Delauter-Term Oxygen Therapy, sleep apnea and Oxygen sleep apnea , COPD, Current Smoker, former smoker,    Pulmonary exam normal breath sounds clear to auscultation       Cardiovascular hypertension, Pt. on home beta blockers and Pt. on medications (-) angina+ Peripheral Vascular Disease and +CHF  (-) Past MI Normal cardiovascular exam+ dysrhythmias Atrial Fibrillation + Valvular Problems/Murmurs MR  Rhythm:Regular Rate:Normal  PVCs   Neuro/Psych negative neurological ROS  negative psych ROS   GI/Hepatic Neg liver ROS, GERD  Medicated and Controlled,Multiple esophageal dilations    Endo/Other  diabetes, Type 2, Oral Hypoglycemic Agents  Renal/GU Renal InsufficiencyRenal disease     Musculoskeletal negative musculoskeletal ROS (+)   Abdominal   Peds negative pediatric ROS (+)  Hematology  (+) Blood dyscrasia, anemia ,   Anesthesia Other Findings   Reproductive/Obstetrics                           Anesthesia Physical Anesthesia Plan  ASA: III  Anesthesia Plan: MAC   Post-op Pain Management:    Induction: Intravenous  Airway Management Planned: Nasal Cannula  Additional Equipment:   Intra-op Plan:   Post-operative Plan:   Informed Consent: I have reviewed the patients History and Physical, chart, labs and discussed the procedure including the risks, benefits and alternatives for the proposed  anesthesia with the patient or authorized representative who has indicated his/her understanding and acceptance.   Dental advisory given  Plan Discussed with: CRNA, Anesthesiologist and Surgeon  Anesthesia Plan Comments: (Discussed risks/benefits/alternatives to MAC sedation including need for ventilatory support, hypotension, need for conversion to general anesthesia.  All patient questions answered.  Patient/guardian wishes to proceed.)       Anesthesia Quick Evaluation

## 2016-12-01 ENCOUNTER — Encounter (HOSPITAL_COMMUNITY): Payer: Self-pay | Admitting: Physician Assistant

## 2016-12-01 DIAGNOSIS — D509 Iron deficiency anemia, unspecified: Secondary | ICD-10-CM | POA: Diagnosis not present

## 2016-12-01 DIAGNOSIS — I493 Ventricular premature depolarization: Secondary | ICD-10-CM | POA: Diagnosis not present

## 2016-12-01 DIAGNOSIS — D508 Other iron deficiency anemias: Secondary | ICD-10-CM

## 2016-12-01 DIAGNOSIS — N183 Chronic kidney disease, stage 3 (moderate): Secondary | ICD-10-CM | POA: Diagnosis not present

## 2016-12-01 DIAGNOSIS — I447 Left bundle-branch block, unspecified: Secondary | ICD-10-CM | POA: Diagnosis not present

## 2016-12-01 DIAGNOSIS — E1151 Type 2 diabetes mellitus with diabetic peripheral angiopathy without gangrene: Secondary | ICD-10-CM | POA: Diagnosis not present

## 2016-12-01 DIAGNOSIS — I5022 Chronic systolic (congestive) heart failure: Secondary | ICD-10-CM | POA: Diagnosis not present

## 2016-12-01 DIAGNOSIS — I428 Other cardiomyopathies: Secondary | ICD-10-CM | POA: Diagnosis not present

## 2016-12-01 DIAGNOSIS — I340 Nonrheumatic mitral (valve) insufficiency: Secondary | ICD-10-CM | POA: Diagnosis not present

## 2016-12-01 DIAGNOSIS — E1122 Type 2 diabetes mellitus with diabetic chronic kidney disease: Secondary | ICD-10-CM | POA: Diagnosis not present

## 2016-12-01 DIAGNOSIS — I130 Hypertensive heart and chronic kidney disease with heart failure and stage 1 through stage 4 chronic kidney disease, or unspecified chronic kidney disease: Secondary | ICD-10-CM | POA: Diagnosis not present

## 2016-12-01 LAB — RESPIRATORY PANEL BY PCR
Adenovirus: NOT DETECTED
BORDETELLA PERTUSSIS-RVPCR: NOT DETECTED
CHLAMYDOPHILA PNEUMONIAE-RVPPCR: NOT DETECTED
CORONAVIRUS 229E-RVPPCR: NOT DETECTED
CORONAVIRUS HKU1-RVPPCR: NOT DETECTED
Coronavirus NL63: NOT DETECTED
Coronavirus OC43: NOT DETECTED
INFLUENZA B-RVPPCR: NOT DETECTED
Influenza A: NOT DETECTED
METAPNEUMOVIRUS-RVPPCR: NOT DETECTED
MYCOPLASMA PNEUMONIAE-RVPPCR: NOT DETECTED
PARAINFLUENZA VIRUS 2-RVPPCR: NOT DETECTED
Parainfluenza Virus 1: NOT DETECTED
Parainfluenza Virus 3: NOT DETECTED
Parainfluenza Virus 4: NOT DETECTED
RESPIRATORY SYNCYTIAL VIRUS-RVPPCR: NOT DETECTED
Rhinovirus / Enterovirus: NOT DETECTED

## 2016-12-01 NOTE — Discharge Summary (Signed)
Discharge Summary    Patient ID: Krista Mason,  MRN: [id number], DOB/AGE: 11/06/1935 81 y.o.  Admit date: 11/30/2016 Discharge date: 12/01/2016  Primary Care Provider: Gerrit Heck Primary Cardiologist: Dr. Acie Fredrickson / Dr. Curt Bears --> seeing advanced CHF clinic post hospital   Discharge Diagnoses    Principal Problem:   PVC (premature ventricular contraction) Active Problems:   PVD (peripheral vascular disease) ([place])   HLD (hyperlipidemia)   CKD (chronic kidney disease), stage III   Tobacco abuse   Moderate mitral regurgitation   Microcytic anemia   Diabetes mellitus with complication (HCC)   Chronic systolic CHF (congestive heart failure) (HCC)   Allergies Allergies  Allergen Reactions  . Penicillins Itching and Rash    Amoxicillin ok- IM pen is what gives reaction  Has patient had a PCN reaction causing immediate rash, facial/tongue/throat swelling, SOB or lightheadedness with hypotension: no Has patient had a PCN reaction causing severe rash involving mucus membranes or skin necrosis: no Has patient had a PCN reaction that required hospitalization {no Has patient had a PCN reaction occurring within the last 10 years: {no If all of the above answers are "NO", then may proceed with Cephalosporin use.  [name] [Cefaclor] Itching  . Crestor [Rosuvastatin Calcium] Nausea Only  . Epinephrine Other (See Comments)    "feels like heart is beating out of chest"  . Nsaids     Pt states she is not taking because of kidney function  . Other Other (See Comments)    FLU SHOT-swelling, redness, fever  . Percocet [Oxycodone-Acetaminophen] Nausea And Vomiting  . 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   She was  recently diagnosed with CHF. She saw her PCP, Dr. Rex Kras, given symptoms of exertional dyspnea and orthopnea. Per Pt report, she was evaluated with a CXR and was told that she had an enlarged heart and CHF. Dr. Rex Kras started her on PO lasix. She also had a chest CT in 06/2016 that showed coronary artery calcification and atherosclerotic calcification in the wall of the thoracic aorta. Of note, 2D echo in 10/2014 showed normal LVEF of 60-65% with grade 1 DD, moderate AI and moderate MR.   She presented to our clinic for f/u on 79/2/17. Krista Simmons PA-C ordered a 2D echo to assess LVF and valve anatomy as well as a NST, given reports of occasional chest discomfort, suspected systolic dysfunction and evidence of coronary artery calcification on recent chest CT. Her 2D echo confirmed new systolic HF with reduction in EF down to 35-40% with diffuse hypokinesis and grade 2 DD. There was no significant change in her valves. Mild AI and moderate MR noted.   Her NST was a low risk study with no evidence for ischemia or infarction. It was noted in both the echo and stress test reports that she had frequent PVCs. She was also noted to have frequent PVCs on EKG during her office visit on 10/23/16. Subsequent 48 hour holter monitor showed  29% multifocal PVCs. She saw Dr. Shonna Chock for further evaluation and it was felt cardiomyopathy likely related to PVCs. She was started on amiodarone.   She was then admitted from 1/15-1/19/18 with A/C CHF. Discharged weight 133 lbs.  Amiodarone was discontinued and PVC ablation was planned. She presented to [facility] on 11/30/16 for planned PVC ablation.     Hospital Course     Consultants: none  PVCs: 48 hour holter monitor showed  29% multifocal PVCs. She failed medical therapy with amiodarone. Now s/p ablation of LV PVCs at the [facility] by Dr. Curt Bears. Periprocedural LBBB and worsening 1AVB. Continue to monitor  Chronic combined S/D CHF: appears euvolemic. EF 35-40%. Felt to be from  PVCs, now s/p ablation. Recent nuclear stress test was low risk. Will need repeat 2D ECHO 4-6 weeks after ablation. Continue Coreg 6.25mg  BID, Losartan 50mg  daily and lasix 40mg  daily. She has been scheduled to see the advanced CHF clinic on 12/10/16  Valvular Disease: no significant change when compared to prior echo in 2016. She has  Mild AI and moderate MR.  Anemia Fe deficiency: followed by [facility], continue iron.   Chart history of PAF: per review of notes, I think this was actually MAT. Continue Coreg.    The patient has had an uncomplicated hospital course and is recovering well. The femoral catheter site is stable. She has been seen by Dr. Caryl Comes today and deemed ready for discharge home. All follow-up appointments have been scheduled. Smoking cessation was disscussed in length. A written RX for a 30 day free supply of Brilinta was provided for the patient. A work excuse note was provided as well. Discharge medications are listed below.  _____________  Discharge Vitals Blood pressure (!) 133/54, pulse 71, temperature 98.7 F (37.1 C), temperature source Oral, resp. rate 18, height 5\' 6"  (1.676 m), weight 127 lb 2 oz (57.7 kg), SpO2 95 %.  Filed Weights   11/30/16 0827  Weight: 127 lb 2 oz (57.7 kg)    Labs & Radiologic Studies     CBC No results for input(s): WBC, NEUTROABS, HGB, HCT, MCV, PLT in the last 72 hours. Basic Metabolic Panel No results for input(s): NA, K, CL, CO2, GLUCOSE, BUN, CREATININE, CALCIUM, MG, PHOS in the last 72 hours. Liver Function Tests No results for input(s): AST, ALT, ALKPHOS, BILITOT, PROT, ALBUMIN in the last 72 hours. No results for input(s): LIPASE, AMYLASE in the last 72 hours. Cardiac Enzymes No results for input(s): CKTOTAL, CKMB, CKMBINDEX, TROPONINI in the last 72 hours. BNP Invalid input(s): POCBNP D-Dimer No results for input(s): DDIMER in the last 72 hours. Hemoglobin A1C No results for input(s): HGBA1C in the last 72  hours. Fasting Lipid Panel No results for input(s): CHOL, HDL, LDLCALC, TRIG, CHOLHDL, LDLDIRECT in the last 72 hours. Thyroid Function Tests No results for input(s): TSH, T4TOTAL, T3FREE, THYROIDAB in the last 72 hours.  Invalid input(s): FREET3  Dg Chest 2 View  Result Date: 11/06/2016 CLINICAL DATA:  Acute on chronic systolic and diastolic CHF. EXAM: CHEST  2 VIEW COMPARISON:  11/05/2016 FINDINGS: Lungs are hyperexpanded as before. The diffuse interstitial and basilar alveolar opacity has resolved in the interval. Tiny bilateral pleural effusions persist. The cardio pericardial silhouette is enlarged. Densely calcified lymph node identified AP window, stable. Bones are diffusely demineralized. Compression deformity identified in the region of the thoracolumbar junction, likely L1. Telemetry leads overlie the chest. IMPRESSION: Interval resolution of interstitial pulmonary edema with some minimal persistent bilateral pleural effusions. Electronically Signed   By: Misty Stanley M.D.   On: 11/06/2016 08:49   Dg Chest Port 1 View  Result Date: 11/05/2016 CLINICAL DATA:  Shortness of breath. EXAM: PORTABLE CHEST 1 VIEW COMPARISON:  Radiograph  10/19/2016. CT 07/11/2016 FINDINGS: Cardiomegaly is similar. Mediastinal contours are unchanged. Atherosclerosis of the thoracic aorta. Calcified left mediastinal lymph nodes. Increased pulmonary edema from prior exam. Blunting of the costophrenic angles, right greater than left, consistent with small effusions. Again seen hyperinflation. No focal airspace disease to suggest pneumonia. No pneumothorax. IMPRESSION: Increasing pulmonary edema, with persistent cardiomegaly and pleural effusions. Findings consistent with worsening CHF. Electronically Signed   By: Jeb Levering M.D.   On: 11/05/2016 03:41     Diagnostic Studies/Procedures    11/30/16 ablation.  SURGEON:  Will Curt Bears, MD  PREPROCEDURE DIAGNOSES: 1. PVCs  POSTPROCEDURE DIAGNOSES: 1.  PVCs  PROCEDURES: 1. Comprehensive electrophysiologic study. 2. Coronary sinus pacing and recording. 3. Three-dimensional mapping of PVCs 4. Intracardiac echocardiography. 5. Transseptal puncture of an intact septum. 6. Arrhythmia induction with pacing with 6 isuprel infusion  INTRODUCTION:  Chaney Ingram Roane is a 81 y.o. female with a history of PVCs who now presents for EP study and radiofrequency ablation.  The patient reports initially being diagnosed with PVCs after presenting with symptomatic palpitations and fatgiue.  The patient has failed medical therapy.  The patient therefore presents today for catheter ablation of atrial fibrillation.  DESCRIPTION OF PROCEDURE:  Informed written consent was obtained, and the patient was brought to the electrophysiology lab in a fasting state.  The patient was adequately sedated with intravenous medications as outlined in the anesthesia report.  The patient's left and right groins were prepped and draped in the usual sterile fashion by the EP lab staff.  Using a percutaneous Seldinger technique, one 8-French and one 6 french  hemostasis sheaths were placed in the right femoral vein, and one 8 [place] and one 11-French hemostasis sheaths were placed into the left common femoral vein.   _____________    Disposition   Pt is being discharged home today in good condition.  Follow-up Plans & Appointments    Follow-up Information    Will [name], MD Follow up on 01/16/2017.   Specialty:  Cardiology Why:  9:45AM Contact information: [place] [address] [phone]            Discharge Medications     Medication List    TAKE these medications   acetaminophen 325 MG tablet Commonly known as:  TYLENOL Take 325 mg by mouth every 6 (six) hours as needed for mild pain. For pain   aspirin 81 MG tablet Take 81 mg by mouth daily.   atorvastatin 20 MG tablet Commonly known as:  LIPITOR Take 1 tablet (20  mg total) by mouth daily.   carvedilol 6.25 MG tablet Commonly known as:  COREG Take 1 tablet (6.25 mg total) by mouth 2 (two) times daily with a meal.   cholecalciferol 1000 units tablet Commonly known as:  VITAMIN D Take 1,000 Units by mouth daily.   denosumab 60 MG/ML Soln injection Commonly known as:  PROLIA Inject 60 mg into the skin every 6 (six) months. Administer in upper arm, thigh, or abdomen   ferrous gluconate 324 MG tablet Commonly known as:  FERGON Take 1 tablet (324 mg total) by mouth daily with breakfast.   furosemide 40 MG tablet Commonly known as:  LASIX Take 1 tablet (40 mg total) by mouth daily.   losartan 50 MG tablet Commonly known as:  COZAAR Take 50 mg by mouth daily.   multivitamin tablet Take 1 tablet by mouth daily.   omeprazole 40 MG capsule Commonly known as:  PRILOSEC 40mg  by mouth twice daily   SYSTANE OP Place 1 drop into both eyes 2 (two) times daily as needed (dryness).         Outstanding Labs/Studies   none  Duration of Discharge Encounter   Greater than 30 minutes including physician time.  Signed, Angelena Form PA-C 12/01/2016, 10:41 AM   See separate note

## 2016-12-01 NOTE — Anesthesia Postprocedure Evaluation (Addendum)
Anesthesia Post Note  Patient: Krista Mason  Procedure(s) Performed: Procedure(s) (LRB): PVC Ablation (N/A)  Patient location during evaluation: PACU Anesthesia Type: MAC Level of consciousness: awake and alert Pain management: pain level controlled Vital Signs Assessment: post-procedure vital signs reviewed and stable Respiratory status: spontaneous breathing, nonlabored ventilation, respiratory function stable and patient connected to nasal cannula oxygen Cardiovascular status: stable and blood pressure returned to baseline Anesthetic complications: no        Last Vitals:  Vitals:   11/30/16 2025 12/01/16 0632  BP: (!) 133/59 (!) 133/54  Pulse: 66 71  Resp: 18 18  Temp: 36.9 C 37.1 C    Last Pain:  Vitals:   12/01/16 0900  TempSrc:   PainSc: 0-No pain   Pain Goal: Patients Stated Pain Goal: 5 (11/30/16 0907)               Avriel Kandel S

## 2016-12-01 NOTE — Discharge Instructions (Signed)
No driving for 1 week. No lifting over 5 lbs for 1 week. No vigorous or sexual activity for 1 week. You may return to work on 12/08/16. Keep procedure site clean & dry. If you notice increased pain, swelling, bleeding or pus, call/return!  You may shower, but no soaking baths/hot tubs/pools for 1 week.

## 2016-12-01 NOTE — Progress Notes (Signed)
Patient Care Team: Leighton Ruff, MD as PCP - General (Family Medicine)   HPI  Krista Mason is a 81 y.o. female w cough that persists But othewise feels better  Ambulating already  Good diuresis   S/p Ablation of LV PVCs at the [facility] Periprocedural LBBB and worsening 1AVB  Hx of multifocal PVCs  Hx of medical therapy-- amiodarone   Preprocedural echo EF 35-40% <<< EF 65% 1/16 Myoview neg 1/18 Holter 29 %   Records and Results Reviewed Preoperative labs notable for anemia  New since 2013  Ferritin 8   Past Medical History:  Diagnosis Date  . Atrial fibrillation ([place])   . Chronic kidney disease   . Diabetes mellitus without complication ([place])   . Hx of echocardiogram    Echo (1/16):  EF 60-65%, no RWMA, Gr 1 DD, mod AI, mod MR, mild LAE  . Hyperkalemia   . Hyperlipidemia   . Hypertension   . Orthostatic hypotension   . PVC (premature ventricular contraction)   . Rheumatic fever   . Sleep apnea     Past Surgical History:  Procedure Laterality Date  . KNEE SURGERY Left 2009  . OVARIAN CYST REMOVAL      Current Facility-Administered Medications  Medication Dose Route Frequency Provider Last Rate Last Dose  . 0.9 %  sodium chloride infusion  250 mL Intravenous PRN Will [name], MD      . acetaminophen (TYLENOL) tablet 325 mg  325 mg Oral Q6H PRN Will [name], MD      . atorvastatin (LIPITOR) tablet 20 mg  20 mg Oral QHS Will [name], MD      . benzonatate (TESSALON) capsule 100 mg  100 mg Oral BID PRN Will [name], MD   100 mg at 11/30/16 2358  . carvedilol (COREG) tablet 6.25 mg  6.25 mg Oral BID WC Will [name], MD   6.25 mg at 11/30/16 1814  . cholecalciferol (VITAMIN D) tablet 1,000 Units  1,000 Units Oral Daily Will [name], MD      . ferrous gluconate [facility]) tablet 324 mg  324 mg Oral Q breakfast Will [name], MD      . furosemide (LASIX) tablet 40 mg  40 mg Oral Daily Will [name], MD   40  mg at 11/30/16 1814  . losartan (COZAAR) tablet 50 mg  50 mg Oral Daily Will [name], MD      . multivitamin with minerals tablet 1 tablet  1 tablet Oral Daily Will [name], MD      . ondansetron [facility]) injection 4 mg  4 mg Intravenous Q6H PRN Will [name], MD      . pantoprazole (PROTONIX) EC tablet 80 mg  80 mg Oral Daily Will [name], MD      . polyvinyl alcohol (LIQUIFILM TEARS) 1.4 % ophthalmic solution 1 drop  1 drop Both Eyes BID PRN Will [name], MD      . sodium chloride flush (NS) 0.9 % injection 3 mL  3 mL Intravenous Q12H Will [name], MD   3 mL at 11/30/16 2327  . sodium chloride flush (NS) 0.9 % injection 3 mL  3 mL Intravenous PRN Will [name], MD        Allergies  Allergen Reactions  . Penicillins Itching and Rash    Amoxicillin ok- IM pen is what gives reaction  Has patient had a PCN reaction causing immediate rash, facial/tongue/throat  swelling, SOB or lightheadedness with hypotension: no Has patient had a PCN reaction causing severe rash involving mucus membranes or skin necrosis: no Has patient had a PCN reaction that required hospitalization {no Has patient had a PCN reaction occurring within the last 10 years: {no If all of the above answers are "NO", then may proceed with Cephalosporin use.  [name] [Cefaclor] Itching  . Crestor [Rosuvastatin Calcium] Nausea Only  . Epinephrine Other (See Comments)    "feels like heart is beating out of chest"  . Nsaids     Pt states she is not taking because of kidney function  . Other Other (See Comments)    FLU SHOT-swelling, redness, fever  . Percocet [Oxycodone-Acetaminophen] Nausea And Vomiting  . Prednisone     Dizziness, spiked blood sugar      Review of Systems negative except from HPI and PMH  Physical Exam BP (!) 133/54 (BP Location: Left Arm)   Pulse 71   Temp 98.7 F (37.1 C) (Oral)   Resp 18   Ht 5\' 6"  (1.676 m)   Wt 127 lb 2 oz (57.7 kg)   SpO2 95%    BMI 20.52 kg/m  Well developed and well nourished in no acute distress HENT normal E scleral and icterus clear Neck Supple JVP 7; carotids brisk and full Clear to ausculation  Somewhat irregular rate and rhythm, no murmurs gallops or rub Soft with active bowel sounds No clubbing cyanosis  Edema Alert and oriented, grossly normal motor and sensory function Skin Warm and Dry  Telemetry Personally reviewed  No PVCs  Overnight   Assessment and  Plan  PVC s s/p ablation  Cardiomyopathy, sub acute ? Cause  Anemia Fe deficiency-- followed by [facility]   CHF chronic mixed Atrial fib  Continue diuresis and afterload  Will need PA visit 2-3 weeks for CHF reassessment  WC 4-6 weeks   With an echo

## 2016-12-03 ENCOUNTER — Telehealth: Payer: Self-pay | Admitting: Cardiology

## 2016-12-03 ENCOUNTER — Encounter (HOSPITAL_COMMUNITY): Payer: Self-pay | Admitting: Cardiology

## 2016-12-03 DIAGNOSIS — M810 Age-related osteoporosis without current pathological fracture: Secondary | ICD-10-CM | POA: Diagnosis not present

## 2016-12-03 DIAGNOSIS — I509 Heart failure, unspecified: Secondary | ICD-10-CM | POA: Diagnosis not present

## 2016-12-03 DIAGNOSIS — J449 Chronic obstructive pulmonary disease, unspecified: Secondary | ICD-10-CM | POA: Diagnosis not present

## 2016-12-03 DIAGNOSIS — E1151 Type 2 diabetes mellitus with diabetic peripheral angiopathy without gangrene: Secondary | ICD-10-CM | POA: Diagnosis not present

## 2016-12-03 DIAGNOSIS — Z7982 Long term (current) use of aspirin: Secondary | ICD-10-CM | POA: Diagnosis not present

## 2016-12-03 DIAGNOSIS — I4891 Unspecified atrial fibrillation: Secondary | ICD-10-CM | POA: Diagnosis not present

## 2016-12-03 DIAGNOSIS — I110 Hypertensive heart disease with heart failure: Secondary | ICD-10-CM | POA: Diagnosis not present

## 2016-12-03 LAB — GLUCOSE, CAPILLARY: Glucose-Capillary: 95 mg/dL (ref 65–99)

## 2016-12-03 NOTE — Telephone Encounter (Signed)
New message   Arvil Persons [facility] is calling to see if pt can or needs to go a week without pt

## 2016-12-03 NOTE — Telephone Encounter (Signed)
[facility] called to ask that we call pt, she had a cath 11-30-16,late yesterday afternoon heart skipping beat and is aware of her heart beating-also coughed up some blood tinged sputum pls call [phone]

## 2016-12-03 NOTE — Telephone Encounter (Signed)
Advised they may resume HHPT Saturday, 2/17.  Informed that it is recommended 1 week of rest post ablation (procedure done on 2/9). She thanks me for calling her back and agreeable to plan for pt.

## 2016-12-03 NOTE — Telephone Encounter (Signed)
Pt states, "My heart was as regular as it could be until late yesterday afternoon.  I could feel the heart beating. Checked pulse and it was skipping." Explained that she may feel "breakthrough" PVCs for a short time post ablation secondary to scarring.  She was not aware and feels better knowing that this is not abnormal.  Advised to call office if she begins to experience more/more frequent palpitations/skipped beats and/or symptoms begin. She will monitor the blood tinged sputum.  States she was coughing a lot last night, she didn't sleep, and may have damaged mucosa or "possibly busted a capillary."  She will continue to monitor and call PCP if continues to be an ongoing issue. She thanks me for calling and states I eased her mind about palpitations she is currently experiencing.

## 2016-12-04 ENCOUNTER — Telehealth: Payer: Self-pay | Admitting: Cardiology

## 2016-12-04 DIAGNOSIS — J449 Chronic obstructive pulmonary disease, unspecified: Secondary | ICD-10-CM | POA: Diagnosis not present

## 2016-12-04 DIAGNOSIS — I4891 Unspecified atrial fibrillation: Secondary | ICD-10-CM | POA: Diagnosis not present

## 2016-12-04 DIAGNOSIS — Z7982 Long term (current) use of aspirin: Secondary | ICD-10-CM | POA: Diagnosis not present

## 2016-12-04 DIAGNOSIS — I509 Heart failure, unspecified: Secondary | ICD-10-CM | POA: Diagnosis not present

## 2016-12-04 DIAGNOSIS — M810 Age-related osteoporosis without current pathological fracture: Secondary | ICD-10-CM | POA: Diagnosis not present

## 2016-12-04 DIAGNOSIS — I110 Hypertensive heart disease with heart failure: Secondary | ICD-10-CM | POA: Diagnosis not present

## 2016-12-04 DIAGNOSIS — E1151 Type 2 diabetes mellitus with diabetic peripheral angiopathy without gangrene: Secondary | ICD-10-CM | POA: Diagnosis not present

## 2016-12-04 NOTE — Telephone Encounter (Signed)
Left message to call back  

## 2016-12-04 NOTE — Telephone Encounter (Signed)
I spoke with Rosann Auerbach and told her OK to help pt with shower.  I told her pt should avoid tub baths. She is also going out to observe and help with activities of daily living and I told her this would be OK.

## 2016-12-04 NOTE — Telephone Encounter (Signed)
New message    Occupational therapist is calling to ask and make sure that is ok she go out to pt to help shower. She just wants permission for the visit.

## 2016-12-04 NOTE — Telephone Encounter (Signed)
Follow up     Leaving to see pt now, please call

## 2016-12-06 ENCOUNTER — Institutional Professional Consult (permissible substitution): Payer: Medicare HMO | Admitting: Internal Medicine

## 2016-12-06 DIAGNOSIS — Z7982 Long term (current) use of aspirin: Secondary | ICD-10-CM | POA: Diagnosis not present

## 2016-12-06 DIAGNOSIS — I4891 Unspecified atrial fibrillation: Secondary | ICD-10-CM | POA: Diagnosis not present

## 2016-12-06 DIAGNOSIS — J449 Chronic obstructive pulmonary disease, unspecified: Secondary | ICD-10-CM | POA: Diagnosis not present

## 2016-12-06 DIAGNOSIS — I509 Heart failure, unspecified: Secondary | ICD-10-CM | POA: Diagnosis not present

## 2016-12-06 DIAGNOSIS — I110 Hypertensive heart disease with heart failure: Secondary | ICD-10-CM | POA: Diagnosis not present

## 2016-12-06 DIAGNOSIS — E1151 Type 2 diabetes mellitus with diabetic peripheral angiopathy without gangrene: Secondary | ICD-10-CM | POA: Diagnosis not present

## 2016-12-06 DIAGNOSIS — M810 Age-related osteoporosis without current pathological fracture: Secondary | ICD-10-CM | POA: Diagnosis not present

## 2016-12-10 ENCOUNTER — Ambulatory Visit (HOSPITAL_COMMUNITY)
Admission: RE | Admit: 2016-12-10 | Discharge: 2016-12-10 | Disposition: A | Discharge status: Home / Self Care | Payer: Medicare HMO | Source: Ambulatory Visit | Attending: Cardiology | Admitting: Cardiology

## 2016-12-10 VITALS — BP 124/70 | HR 80 | Wt 129.0 lb

## 2016-12-10 DIAGNOSIS — I5042 Chronic combined systolic (congestive) and diastolic (congestive) heart failure: Secondary | ICD-10-CM | POA: Insufficient documentation

## 2016-12-10 DIAGNOSIS — I130 Hypertensive heart and chronic kidney disease with heart failure and stage 1 through stage 4 chronic kidney disease, or unspecified chronic kidney disease: Secondary | ICD-10-CM | POA: Diagnosis not present

## 2016-12-10 DIAGNOSIS — F17200 Nicotine dependence, unspecified, uncomplicated: Secondary | ICD-10-CM | POA: Diagnosis not present

## 2016-12-10 DIAGNOSIS — I340 Nonrheumatic mitral (valve) insufficiency: Secondary | ICD-10-CM

## 2016-12-10 DIAGNOSIS — E1122 Type 2 diabetes mellitus with diabetic chronic kidney disease: Secondary | ICD-10-CM | POA: Diagnosis not present

## 2016-12-10 DIAGNOSIS — I493 Ventricular premature depolarization: Secondary | ICD-10-CM | POA: Insufficient documentation

## 2016-12-10 DIAGNOSIS — R69 Illness, unspecified: Secondary | ICD-10-CM | POA: Diagnosis not present

## 2016-12-10 DIAGNOSIS — N183 Chronic kidney disease, stage 3 unspecified: Secondary | ICD-10-CM

## 2016-12-10 DIAGNOSIS — E785 Hyperlipidemia, unspecified: Secondary | ICD-10-CM | POA: Diagnosis not present

## 2016-12-10 DIAGNOSIS — I5022 Chronic systolic (congestive) heart failure: Secondary | ICD-10-CM

## 2016-12-10 DIAGNOSIS — G4730 Sleep apnea, unspecified: Secondary | ICD-10-CM | POA: Insufficient documentation

## 2016-12-10 DIAGNOSIS — I440 Atrioventricular block, first degree: Secondary | ICD-10-CM | POA: Insufficient documentation

## 2016-12-10 MED ORDER — CARVEDILOL 6.25 MG PO TABS: 9.3750 mg | ORAL_TABLET | Freq: Two times a day (BID) | ORAL | 3 refills | Status: DC

## 2016-12-10 MED ORDER — LOSARTAN POTASSIUM 50 MG PO TABS: 50.0000 mg | ORAL_TABLET | Freq: Every day | ORAL | 3 refills | Status: DC

## 2016-12-10 MED ORDER — FERROUS GLUCONATE 324 (38 FE) MG PO TABS: 324.0000 mg | ORAL_TABLET | Freq: Every day | ORAL | 1 refills | Status: DC

## 2016-12-10 MED ORDER — CARVEDILOL 6.25 MG PO TABS: 6.2500 mg | ORAL_TABLET | Freq: Two times a day (BID) | ORAL | 0 refills | Status: DC

## 2016-12-10 MED ORDER — FERROUS GLUCONATE 324 (38 FE) MG PO TABS: 324.0000 mg | ORAL_TABLET | Freq: Every day | ORAL | 3 refills | Status: DC

## 2016-12-10 NOTE — Patient Instructions (Addendum)
Increase Coreg to 9.375 mg (1.5 tablets) Two times daily  HH-PT has been renewed for you.  Follow up in 4-6 weeks with Echo.

## 2016-12-10 NOTE — Progress Notes (Signed)
Advanced Heart Failure Clinic Note   Referring Physician: Dr. Acie Fredrickson Primary Care: Leighton Ruff, MD Primary Cardiologist: Dr. Acie Fredrickson EP: Dr. Ileana Ladd HF: New (Dr. Haroldine Laws)  HPI:  Krista Mason is a 81 y.o. female with hx of HTN, PVCs, HL, DM2, CKD, histoplasmosis, and tobacco abuse.   Diagnosed with CHF 10/19/17 with symptoms of DOE and orthopnea. CXR with cardiomegaly at that time and started on po lasix.   Echo 10/24/16 LVEF 35-40%, Grade 2 DD, Moderate LAE, mild AI, Moderate MR.  NST low risk with no evidence of ischemia or infarction, but frequent PVCs.  48 hr Holter monitor placed 10/23/16 with 29% multifocal PVCs. Felt to be etiology of cardiomyopathy.   Pt presents today for post hospital follow up and to establish with HF clinic. S/p PVC ablation by Dr. Curt Bears 11/30/16.  Has had far less palpitations and feeling better overall.  Hasn't needed 02 since last Tuesday. Does still get SOB with bathing. No SOB walking around the house otherwise.  Got a little winded walking into clinic. Weight at home 126-127 since procedure. Taking lasix 40 mg daily.  Hasn't needed any extra. Occasional lightheadedness with rapid standing but not marked or limiting. Denies orthopnea. Has been sleeping in a recliner since December. No bendopnea that she can think of.  No chest pain. + cough. Clearish/white/yellow production. No fevers or chills.   Review of Systems: [y] = yes, [ ]  = no   General: Weight gain [ ] ; Weight loss [ ] ; Anorexia [ ] ; Fatigue [y]; Fever [ ] ; Chills [ ] ; Weakness [ ]   Cardiac: Chest pain/pressure [ ] ; Resting SOB [ ] ; Exertional SOB [y]; Orthopnea [y]; Pedal Edema [ ] ; Palpitations [y]; Syncope [ ] ; Presyncope [ ] ; Paroxysmal nocturnal dyspnea[ ]   Pulmonary: Cough [ ] ; Wheezing[ ] ; Hemoptysis[ ] ; Sputum [ ] ; Snoring [ ]   GI: Vomiting[ ] ; Dysphagia[ ] ; Melena[ ] ; Hematochezia [ ] ; Heartburn[ ] ; Abdominal pain [ ] ; Constipation [ ] ; Diarrhea [ ] ; BRBPR [ ]   GU: Hematuria[ ] ;  Dysuria [ ] ; Nocturia[ ]   Vascular: Pain in legs with walking [ ] ; Pain in feet with lying flat [ ] ; Non-healing sores [ ] ; Stroke [ ] ; TIA [ ] ; Slurred speech [ ] ;  Neuro: Headaches[ ] ; Vertigo[ ] ; Seizures[ ] ; Paresthesias[ ] ;Blurred vision [ ] ; Diplopia [ ] ; Vision changes [ ]   Ortho/Skin: Arthritis [y]; Joint pain [y]; Muscle pain [ ] ; Joint swelling [ ] ; Back Pain [ ] ; Rash [ ]   Psych: Depression[ ] ; Anxiety[ ]   Heme: Bleeding problems [ ] ; Clotting disorders [ ] ; Anemia [ ]   Endocrine: Diabetes [ ] ; Thyroid dysfunction[ ]     Past Medical History:  Diagnosis Date  . Atrial fibrillation ([place])   . Chronic combined systolic and diastolic CHF (congestive heart failure) ([place])    a. LV dysfcuntion felt to be related to PVCs  . Chronic kidney disease   . Diabetes mellitus without complication ([place])   . Hx of echocardiogram    Echo (1/16):  EF 60-65%, no RWMA, Gr 1 DD, mod AI, mod MR, mild LAE  . Hyperkalemia   . Hyperlipidemia   . Hypertension   . Orthostatic hypotension   . PVC (premature ventricular contraction)    a. s/p PVC ablation in 11/2016  . Rheumatic fever   . Sleep apnea     Current Outpatient Prescriptions  Medication Sig Dispense Refill  . acetaminophen (TYLENOL) 325 MG tablet Take 325 mg by mouth every 6 (six) hours  as needed for mild pain. For pain     . aspirin 81 MG tablet Take 81 mg by mouth daily.    [name] atorvastatin (LIPITOR) 20 MG tablet Take 1 tablet (20 mg total) by mouth daily. 30 tablet 4  . carvedilol (COREG) 6.25 MG tablet Take 1 tablet (6.25 mg total) by mouth 2 (two) times daily with a meal. 60 tablet 0  . cholecalciferol (VITAMIN D) 1000 UNITS tablet Take 1,000 Units by mouth daily.     [name] denosumab (PROLIA) 60 MG/ML SOLN injection Inject 60 mg into the skin every 6 (six) months. Administer in upper arm, thigh, or abdomen    . ferrous gluconate (FERGON) 324 MG tablet Take 1 tablet (324 mg total) by mouth daily with breakfast. 30 tablet 1  . furosemide  (LASIX) 40 MG tablet Take 1 tablet (40 mg total) by mouth daily. 30 tablet 0  . losartan (COZAAR) 50 MG tablet Take 50 mg by mouth daily.  1  . Multiple Vitamin (MULTIVITAMIN) tablet Take 1 tablet by mouth daily.    [name] omeprazole (PRILOSEC) 40 MG capsule 40mg  by mouth twice daily  4  . Polyethyl Glycol-Propyl Glycol (SYSTANE OP) Place 1 drop into both eyes 2 (two) times daily as needed (dryness).     No current facility-administered medications for this encounter.     Allergies  Allergen Reactions  . Penicillins Itching and Rash    Amoxicillin ok- IM pen is what gives reaction  Has patient had a PCN reaction causing immediate rash, facial/tongue/throat swelling, SOB or lightheadedness with hypotension: no Has patient had a PCN reaction causing severe rash involving mucus membranes or skin necrosis: no Has patient had a PCN reaction that required hospitalization {no Has patient had a PCN reaction occurring within the last 10 years: {no If all of the above answers are "NO", then may proceed with Cephalosporin use.  [name] [Cefaclor] Itching  . Crestor [Rosuvastatin Calcium] Nausea Only  . Epinephrine Other (See Comments)    "feels like heart is beating out of chest"  . Nsaids     Pt states she is not taking because of kidney function  . Other Other (See Comments)    FLU SHOT-swelling, redness, fever  . Percocet [Oxycodone-Acetaminophen] Nausea And Vomiting  . Prednisone     Dizziness, spiked blood sugar      Social History   Social History  . Marital status: Widowed    Spouse name: N/A  . Number of children: N/A  . Years of education: N/A   Occupational History  . Not on file.   Social History Main Topics  . Smoking status: Current Every Day Smoker    Packs/day: 0.75    Years: 50.00  . Smokeless tobacco: Never Used     Comment: pt states " I'm thinking about it"  . Alcohol use No  . Drug use: No  . Sexual activity: Not on file   Other Topics Concern  . Not on  file   Social History Narrative  . No narrative on file      Family History  Problem Relation Age of Onset  . Heart attack Father   . Heart disease Father   . Cancer Father   . Hypertension Father   . Hypertension Brother   . Stroke Brother   . Hypertension Brother   . Diabetes Son   . Hyperlipidemia Son   . Hypertension Son     Vitals:   12/10/16 0904  BP:  124/70  Pulse: 80  SpO2: 100%  Weight: 129 lb (58.5 kg)     PHYSICAL EXAM: General:  Elderly appearing, NAD. Ambulated into clinic without difficulty.  HEENT: Normal Neck: supple. JVD 7-8 cm. Carotids 2+ bilat; no bruits. No thyromegaly or nodule noted.  Cor: PMI nondisplaced. Regular with occasional ectopy. No M/G/R appreciated.  Lungs: CTAB, normal effort Abdomen: soft, nontender, nondistended. No hepatosplenomegaly. No bruits or masses. Good bowel sounds. Extremities: no cyanosis, clubbing, rash, edema Neuro: alert & oriented x 3, cranial nerves grossly intact. moves all 4 extremities w/o difficulty. Affect pleasant.  ECG: NSR with 1st degree AV block with PACs   ASSESSMENT & PLAN:  1. Chronic combined CHF - LVEF 35-40% echo 10/2016 with Grade 2 DD - Volume status looks stable on exam. ReDs vest #29%. NYHA Class III symptoms. - Continue lasix 40 mg daily with extra as needed. - Increase coreg to 9.375 mg BID cautiously. Call if any worsening fatigue or has lightheadedness/dizziness  - PVCs thought to be contributing factor as below.  2. PVCs - 48 Holter with 29% focal PVCs. Failed amio - No s/p PVC ablation.  - Will plan to repeat Echo in 4-6 weeks to look for improvement s/p PVC ablation 3. Valvular disease - Stable on echo.   Mild AI and Moderate MR.  4. CKD III - Follow closely with any med adjustments.   Volume status stable. Feeling much better after PVC ablation. Plan repeat Echo 4-6 weeks on MD side to look for EF improvement.  Meds as above.  [name], PA-C 12/10/16  Total time  spent > 25 minutes. Over half that spent discussing the above

## 2016-12-11 ENCOUNTER — Institutional Professional Consult (permissible substitution): Payer: Medicare HMO | Admitting: Internal Medicine

## 2016-12-11 ENCOUNTER — Other Ambulatory Visit (HOSPITAL_COMMUNITY): Payer: Self-pay | Admitting: *Deleted

## 2016-12-11 MED ORDER — CARVEDILOL 6.25 MG PO TABS: 9.3750 mg | ORAL_TABLET | Freq: Two times a day (BID) | ORAL | 3 refills | Status: DC

## 2016-12-12 DIAGNOSIS — J449 Chronic obstructive pulmonary disease, unspecified: Secondary | ICD-10-CM | POA: Diagnosis not present

## 2016-12-12 DIAGNOSIS — I4891 Unspecified atrial fibrillation: Secondary | ICD-10-CM | POA: Diagnosis not present

## 2016-12-12 DIAGNOSIS — E1151 Type 2 diabetes mellitus with diabetic peripheral angiopathy without gangrene: Secondary | ICD-10-CM | POA: Diagnosis not present

## 2016-12-12 DIAGNOSIS — M810 Age-related osteoporosis without current pathological fracture: Secondary | ICD-10-CM | POA: Diagnosis not present

## 2016-12-12 DIAGNOSIS — I509 Heart failure, unspecified: Secondary | ICD-10-CM | POA: Diagnosis not present

## 2016-12-12 DIAGNOSIS — Z7982 Long term (current) use of aspirin: Secondary | ICD-10-CM | POA: Diagnosis not present

## 2016-12-12 DIAGNOSIS — I110 Hypertensive heart disease with heart failure: Secondary | ICD-10-CM | POA: Diagnosis not present

## 2016-12-13 DIAGNOSIS — E1151 Type 2 diabetes mellitus with diabetic peripheral angiopathy without gangrene: Secondary | ICD-10-CM | POA: Diagnosis not present

## 2016-12-13 DIAGNOSIS — I4891 Unspecified atrial fibrillation: Secondary | ICD-10-CM | POA: Diagnosis not present

## 2016-12-13 DIAGNOSIS — I110 Hypertensive heart disease with heart failure: Secondary | ICD-10-CM | POA: Diagnosis not present

## 2016-12-13 DIAGNOSIS — M810 Age-related osteoporosis without current pathological fracture: Secondary | ICD-10-CM | POA: Diagnosis not present

## 2016-12-13 DIAGNOSIS — J449 Chronic obstructive pulmonary disease, unspecified: Secondary | ICD-10-CM | POA: Diagnosis not present

## 2016-12-13 DIAGNOSIS — I509 Heart failure, unspecified: Secondary | ICD-10-CM | POA: Diagnosis not present

## 2016-12-13 DIAGNOSIS — Z7982 Long term (current) use of aspirin: Secondary | ICD-10-CM | POA: Diagnosis not present

## 2016-12-14 DIAGNOSIS — I110 Hypertensive heart disease with heart failure: Secondary | ICD-10-CM | POA: Diagnosis not present

## 2016-12-14 DIAGNOSIS — I4891 Unspecified atrial fibrillation: Secondary | ICD-10-CM | POA: Diagnosis not present

## 2016-12-14 DIAGNOSIS — Z7982 Long term (current) use of aspirin: Secondary | ICD-10-CM | POA: Diagnosis not present

## 2016-12-14 DIAGNOSIS — E1151 Type 2 diabetes mellitus with diabetic peripheral angiopathy without gangrene: Secondary | ICD-10-CM | POA: Diagnosis not present

## 2016-12-14 DIAGNOSIS — M810 Age-related osteoporosis without current pathological fracture: Secondary | ICD-10-CM | POA: Diagnosis not present

## 2016-12-14 DIAGNOSIS — J449 Chronic obstructive pulmonary disease, unspecified: Secondary | ICD-10-CM | POA: Diagnosis not present

## 2016-12-14 DIAGNOSIS — I509 Heart failure, unspecified: Secondary | ICD-10-CM | POA: Diagnosis not present

## 2016-12-14 DIAGNOSIS — R8290 Unspecified abnormal findings in urine: Secondary | ICD-10-CM | POA: Diagnosis not present

## 2016-12-14 DIAGNOSIS — D649 Anemia, unspecified: Secondary | ICD-10-CM | POA: Diagnosis not present

## 2016-12-15 DIAGNOSIS — R0602 Shortness of breath: Secondary | ICD-10-CM | POA: Diagnosis not present

## 2016-12-15 DIAGNOSIS — I5022 Chronic systolic (congestive) heart failure: Secondary | ICD-10-CM | POA: Diagnosis not present

## 2016-12-17 DIAGNOSIS — Z7982 Long term (current) use of aspirin: Secondary | ICD-10-CM | POA: Diagnosis not present

## 2016-12-17 DIAGNOSIS — E1151 Type 2 diabetes mellitus with diabetic peripheral angiopathy without gangrene: Secondary | ICD-10-CM | POA: Diagnosis not present

## 2016-12-17 DIAGNOSIS — I509 Heart failure, unspecified: Secondary | ICD-10-CM | POA: Diagnosis not present

## 2016-12-17 DIAGNOSIS — I110 Hypertensive heart disease with heart failure: Secondary | ICD-10-CM | POA: Diagnosis not present

## 2016-12-17 DIAGNOSIS — J449 Chronic obstructive pulmonary disease, unspecified: Secondary | ICD-10-CM | POA: Diagnosis not present

## 2016-12-17 DIAGNOSIS — I4891 Unspecified atrial fibrillation: Secondary | ICD-10-CM | POA: Diagnosis not present

## 2016-12-17 DIAGNOSIS — M810 Age-related osteoporosis without current pathological fracture: Secondary | ICD-10-CM | POA: Diagnosis not present

## 2016-12-19 DIAGNOSIS — J449 Chronic obstructive pulmonary disease, unspecified: Secondary | ICD-10-CM | POA: Diagnosis not present

## 2016-12-19 DIAGNOSIS — M810 Age-related osteoporosis without current pathological fracture: Secondary | ICD-10-CM | POA: Diagnosis not present

## 2016-12-19 DIAGNOSIS — Z7982 Long term (current) use of aspirin: Secondary | ICD-10-CM | POA: Diagnosis not present

## 2016-12-19 DIAGNOSIS — I4891 Unspecified atrial fibrillation: Secondary | ICD-10-CM | POA: Diagnosis not present

## 2016-12-19 DIAGNOSIS — E1151 Type 2 diabetes mellitus with diabetic peripheral angiopathy without gangrene: Secondary | ICD-10-CM | POA: Diagnosis not present

## 2016-12-19 DIAGNOSIS — I110 Hypertensive heart disease with heart failure: Secondary | ICD-10-CM | POA: Diagnosis not present

## 2016-12-19 DIAGNOSIS — I509 Heart failure, unspecified: Secondary | ICD-10-CM | POA: Diagnosis not present

## 2016-12-20 ENCOUNTER — Encounter: Payer: Self-pay | Admitting: Cardiology

## 2016-12-25 DIAGNOSIS — I110 Hypertensive heart disease with heart failure: Secondary | ICD-10-CM | POA: Diagnosis not present

## 2016-12-25 DIAGNOSIS — I509 Heart failure, unspecified: Secondary | ICD-10-CM | POA: Diagnosis not present

## 2016-12-25 DIAGNOSIS — E1151 Type 2 diabetes mellitus with diabetic peripheral angiopathy without gangrene: Secondary | ICD-10-CM | POA: Diagnosis not present

## 2016-12-25 DIAGNOSIS — M810 Age-related osteoporosis without current pathological fracture: Secondary | ICD-10-CM | POA: Diagnosis not present

## 2016-12-25 DIAGNOSIS — I4891 Unspecified atrial fibrillation: Secondary | ICD-10-CM | POA: Diagnosis not present

## 2016-12-25 DIAGNOSIS — Z7982 Long term (current) use of aspirin: Secondary | ICD-10-CM | POA: Diagnosis not present

## 2016-12-25 DIAGNOSIS — J449 Chronic obstructive pulmonary disease, unspecified: Secondary | ICD-10-CM | POA: Diagnosis not present

## 2016-12-26 ENCOUNTER — Encounter (INDEPENDENT_AMBULATORY_CARE_PROVIDER_SITE_OTHER): Payer: Self-pay

## 2016-12-26 ENCOUNTER — Ambulatory Visit (INDEPENDENT_AMBULATORY_CARE_PROVIDER_SITE_OTHER): Payer: Medicare HMO | Admitting: Cardiology

## 2016-12-26 ENCOUNTER — Encounter: Payer: Self-pay | Admitting: Cardiology

## 2016-12-26 VITALS — BP 128/80 | HR 81 | Ht 66.0 in | Wt 126.6 lb

## 2016-12-26 DIAGNOSIS — I493 Ventricular premature depolarization: Secondary | ICD-10-CM

## 2016-12-26 NOTE — Progress Notes (Signed)
Electrophysiology Office Note   Date:  12/26/2016   ID:  Krista Mason, DOB 1935-11-22, MRN [id number]  PCP:  Gerrit Heck, MD  Cardiologist:  [place] Primary Electrophysiologist:  Dorenda Pfannenstiel [name], MD    Chief Complaint  Patient presents with  . Follow-up    PVC's     History of Present Illness: Krista Mason is a 81 y.o. female who presents today for electrophysiology evaluation.   Hx of HTN, PVCs, HLD, DM2, CKD, histoplasmosis, and tobacco abuse. Recently diagnosed with CHF. Myoview without ischemia. Holter with 29% PVCs. She has been having episodes of fatigue and shortness of breath.  That being said, her SOB has improved since starting her lasix. She is sleeping better but does still complain of palpitations. She previously had to sleep in a chair but was able to sleep on one pillow recently. Had PVC ablation 11/26/16 with ablation inferior the RCC. Ablation was complicated by LBBB.   Today, she denies symptoms of chest pain, lower extremity edema, claudication, dizziness, presyncope, syncope, bleeding, or neurologic sequela. The patient is tolerating medications without difficulties. He does still have fatigue. She is improving as far as her activity levels, but she has not driving according to her daughter. She otherwise feels well without major complaint.   Past Medical History:  Diagnosis Date  . Atrial fibrillation ([place])   . Chronic combined systolic and diastolic CHF (congestive heart failure) ([place])    a. LV dysfcuntion felt to be related to PVCs  . Chronic kidney disease   . Diabetes mellitus without complication ([place])   . Hx of echocardiogram    Echo (1/16):  EF 60-65%, no RWMA, Gr 1 DD, mod AI, mod MR, mild LAE  . Hyperkalemia   . Hyperlipidemia   . Hypertension   . Orthostatic hypotension   . PVC (premature ventricular contraction)    a. s/p PVC ablation in 11/2016  . Rheumatic fever   . Sleep apnea    Past Surgical History:  Procedure Laterality  Date  . KNEE SURGERY Left 2009  . OVARIAN CYST REMOVAL    . PVC ABLATION N/A 11/30/2016   Procedure: PVC Ablation;  Surgeon: Tyreanna Bisesi [name], MD;  Location: [facility];  Service: Cardiovascular;  Laterality: N/A;     Current Outpatient Prescriptions  Medication Sig Dispense Refill  . acetaminophen (TYLENOL) 325 MG tablet Take 325 mg by mouth every 6 (six) hours as needed for mild pain. For pain     . aspirin 81 MG tablet Take 81 mg by mouth daily.    [name] atorvastatin (LIPITOR) 20 MG tablet Take 1 tablet (20 mg total) by mouth daily. 30 tablet 4  . carvedilol (COREG) 6.25 MG tablet Take 1.5 tablets (9.375 mg total) by mouth 2 (two) times daily with a meal. 90 tablet 3  . cholecalciferol (VITAMIN D) 1000 UNITS tablet Take 1,000 Units by mouth daily.     [name] denosumab (PROLIA) 60 MG/ML SOLN injection Inject 60 mg into the skin every 6 (six) months. Administer in upper arm, thigh, or abdomen    . ferrous gluconate (FERGON) 324 MG tablet Take 1 tablet (324 mg total) by mouth daily with breakfast. 30 tablet 1  . furosemide (LASIX) 40 MG tablet Take 1 tablet (40 mg total) by mouth daily. 30 tablet 0  . losartan (COZAAR) 50 MG tablet Take 1 tablet (50 mg total) by mouth daily. 30 tablet 3  . Multiple Vitamin (MULTIVITAMIN) tablet Take 1 tablet by  mouth daily.    [name] omeprazole (PRILOSEC) 40 MG capsule 40mg  by mouth twice daily  4  . Polyethyl Glycol-Propyl Glycol (SYSTANE OP) Place 1 drop into both eyes 2 (two) times daily as needed (dryness).     No current facility-administered medications for this visit.     Allergies:   Penicillins; Ceclor [cefaclor]; Crestor [rosuvastatin calcium]; Epinephrine; Nsaids; Other; Percocet [oxycodone-acetaminophen]; and Prednisone   Social History:  The patient  reports that she has been smoking.  She has a 37.50 pack-year smoking history. She has never used smokeless tobacco. She reports that she does not drink alcohol or use drugs.   Family History:  The  patient's family history includes Cancer in her father; Diabetes in her son; Heart attack in her father; Heart disease in her father; Hyperlipidemia in her son; Hypertension in her brother, brother, father, and son; Stroke in her brother.    ROS:  Please see the history of present illness.   Otherwise, review of systems is positive for respiratory down, palpitations, back pain, dizziness, easy bruising.   All other systems are reviewed and negative.    PHYSICAL EXAM: VS:  BP 128/80   Pulse 81   Ht 5\' 6"  (1.676 m)   Wt 126 lb 9.6 oz (57.4 kg)   BMI 20.43 kg/m  , BMI Body mass index is 20.43 kg/m. GEN: Well nourished, well developed, in no acute distress  HEENT: normal  Neck: no JVD, carotid bruits, or masses Cardiac: RRR; no murmurs, rubs, or gallops,no edema  Respiratory:  clear to auscultation bilaterally, normal work of breathing GI: soft, nontender, nondistended, + BS MS: no deformity or atrophy  Skin: warm and dry Neuro:  Strength and sensation are intact Psych: euthymic mood, full affect  EKG:  EKG is ordered today. Personal review of the ekg ordered shows sinus rhythm, APCs, LBBB, 1 degree AV block  Recent Labs: 10/23/2016: NT-Pro BNP 4,005 11/05/2016: TSH 2.077 11/08/2016: Hemoglobin 9.1; Magnesium 2.1 11/09/2016: B Natriuretic Peptide 212.2 11/27/2016: BUN 21; Creatinine, Ser 1.04; Platelets 373; Potassium 4.2; Sodium 137    Lipid Panel  No results found for: CHOL, TRIG, HDL, CHOLHDL, VLDL, LDLCALC, LDLDIRECT   Wt Readings from Last 3 Encounters:  12/26/16 126 lb 9.6 oz (57.4 kg)  12/10/16 129 lb (58.5 kg)  11/30/16 127 lb 2 oz (57.7 kg)      Other studies Reviewed: Additional studies/ records that were reviewed today include: TTE 10/24/16, SPECT 10/24/16, Holter 10/30/16  Review of the above records today demonstrates:  - Left ventricle: Systolic function was moderately reduced. The   estimated ejection fraction was in the range of 35% to 40%.   Diffuse hypokinesis.  Features are consistent with a pseudonormal   left ventricular filling pattern, with concomitant abnormal   relaxation and increased filling pressure (grade 2 diastolic   dysfunction). - Aortic valve: There was mild regurgitation. - Mitral valve: There was moderate regurgitation directed   centrally. Valve area by pressure half-time: 2.47 cm^2. - Left atrium: The atrium was moderately dilated. - Pericardium, extracardiac: A small pericardial effusion was   identified circumferential to the heart. There was no evidence of   hemodynamic compromise.   There was no ST segment deviation noted during stress.  This is a low risk study.   1. No evidence for ischemia or infarction.  2. Ungated study.  3. Frequent PVCs.    NSR  Frequent PVCs  Her PVC burden is 29%   ASSESSMENT AND PLAN:  1.  PVCs: Had PVC ablation 11/26/16. PVCs were coming from inferior to the right coronary cusp. Since ablation, she has had no further episodes of palpitations. Her EKG today shows a persistent left bundle branch block after ablation but no evidence of PVCs. We'll continue current management.  2. Nonischemic cardiomyopathy: Likely due to her elevated PVC burden. She is on optimal medical therapy with metoprolol and an ARB. Hopefully decreasing her PVC  Burden with amiodarone Krista Mason help to improve her ejection fraction. Plan for echocardiogram in the future to assess LV function.  3. Hyperlipidemia: She is on lovastatin which has an interaction with amiodarone. We'll switch her to Lipitor.    Current medicines are reviewed at length with the patient today.   The patient does not have concerns regarding her medicines.  The following changes were made today:  none  Labs/ tests ordered today include:  No orders of the defined types were placed in this encounter.    Disposition:   FU with Coren Sagan 3 months  Signed, Peightyn Roberson [name], MD  12/26/2016 9:48 AM     [facility] [address] [place] [address] [phone] (office) [phone] (fax)

## 2016-12-26 NOTE — Patient Instructions (Signed)
Medication Instructions:    Your physician recommends that you continue on your current medications as directed. Please refer to the Current Medication list given to you today.  - If you need a refill on your cardiac medications before your next appointment, please call your pharmacy.   Labwork:  None ordered  Testing/Procedures:  None ordered  Follow-Up:  Your physician recommends that you schedule a follow-up appointment in: 3 months with Dr. Camnitz.  Thank you for choosing [facility]!!   Sherri Price, RN [phone]         

## 2016-12-27 DIAGNOSIS — I110 Hypertensive heart disease with heart failure: Secondary | ICD-10-CM | POA: Diagnosis not present

## 2016-12-27 DIAGNOSIS — J449 Chronic obstructive pulmonary disease, unspecified: Secondary | ICD-10-CM | POA: Diagnosis not present

## 2016-12-27 DIAGNOSIS — I509 Heart failure, unspecified: Secondary | ICD-10-CM | POA: Diagnosis not present

## 2016-12-27 DIAGNOSIS — I4891 Unspecified atrial fibrillation: Secondary | ICD-10-CM | POA: Diagnosis not present

## 2016-12-27 DIAGNOSIS — E1151 Type 2 diabetes mellitus with diabetic peripheral angiopathy without gangrene: Secondary | ICD-10-CM | POA: Diagnosis not present

## 2016-12-27 DIAGNOSIS — M810 Age-related osteoporosis without current pathological fracture: Secondary | ICD-10-CM | POA: Diagnosis not present

## 2016-12-27 DIAGNOSIS — Z7982 Long term (current) use of aspirin: Secondary | ICD-10-CM | POA: Diagnosis not present

## 2016-12-28 DIAGNOSIS — I509 Heart failure, unspecified: Secondary | ICD-10-CM | POA: Diagnosis not present

## 2016-12-28 DIAGNOSIS — J449 Chronic obstructive pulmonary disease, unspecified: Secondary | ICD-10-CM | POA: Diagnosis not present

## 2016-12-28 DIAGNOSIS — M810 Age-related osteoporosis without current pathological fracture: Secondary | ICD-10-CM | POA: Diagnosis not present

## 2016-12-28 DIAGNOSIS — Z7982 Long term (current) use of aspirin: Secondary | ICD-10-CM | POA: Diagnosis not present

## 2016-12-28 DIAGNOSIS — I4891 Unspecified atrial fibrillation: Secondary | ICD-10-CM | POA: Diagnosis not present

## 2016-12-28 DIAGNOSIS — I110 Hypertensive heart disease with heart failure: Secondary | ICD-10-CM | POA: Diagnosis not present

## 2016-12-28 DIAGNOSIS — E1151 Type 2 diabetes mellitus with diabetic peripheral angiopathy without gangrene: Secondary | ICD-10-CM | POA: Diagnosis not present

## 2016-12-29 ENCOUNTER — Other Ambulatory Visit: Payer: Self-pay | Admitting: Cardiology

## 2016-12-29 ENCOUNTER — Other Ambulatory Visit (HOSPITAL_COMMUNITY): Payer: Self-pay | Admitting: Student

## 2016-12-29 MED ORDER — FUROSEMIDE 40 MG PO TABS: 40.0000 mg | ORAL_TABLET | Freq: Every day | ORAL | 0 refills | Status: DC

## 2016-12-31 ENCOUNTER — Other Ambulatory Visit (HOSPITAL_COMMUNITY): Payer: Self-pay | Admitting: Cardiology

## 2016-12-31 MED ORDER — ASPIRIN 81 MG PO TABS: 81.0000 mg | ORAL_TABLET | Freq: Every day | ORAL | 11 refills | Status: DC

## 2017-01-01 DIAGNOSIS — I4891 Unspecified atrial fibrillation: Secondary | ICD-10-CM | POA: Diagnosis not present

## 2017-01-01 DIAGNOSIS — I110 Hypertensive heart disease with heart failure: Secondary | ICD-10-CM | POA: Diagnosis not present

## 2017-01-01 DIAGNOSIS — E1151 Type 2 diabetes mellitus with diabetic peripheral angiopathy without gangrene: Secondary | ICD-10-CM | POA: Diagnosis not present

## 2017-01-01 DIAGNOSIS — Z7982 Long term (current) use of aspirin: Secondary | ICD-10-CM | POA: Diagnosis not present

## 2017-01-01 DIAGNOSIS — J449 Chronic obstructive pulmonary disease, unspecified: Secondary | ICD-10-CM | POA: Diagnosis not present

## 2017-01-01 DIAGNOSIS — M810 Age-related osteoporosis without current pathological fracture: Secondary | ICD-10-CM | POA: Diagnosis not present

## 2017-01-01 DIAGNOSIS — I509 Heart failure, unspecified: Secondary | ICD-10-CM | POA: Diagnosis not present

## 2017-01-03 DIAGNOSIS — Z7982 Long term (current) use of aspirin: Secondary | ICD-10-CM | POA: Diagnosis not present

## 2017-01-03 DIAGNOSIS — I509 Heart failure, unspecified: Secondary | ICD-10-CM | POA: Diagnosis not present

## 2017-01-03 DIAGNOSIS — E1151 Type 2 diabetes mellitus with diabetic peripheral angiopathy without gangrene: Secondary | ICD-10-CM | POA: Diagnosis not present

## 2017-01-03 DIAGNOSIS — M810 Age-related osteoporosis without current pathological fracture: Secondary | ICD-10-CM | POA: Diagnosis not present

## 2017-01-03 DIAGNOSIS — I4891 Unspecified atrial fibrillation: Secondary | ICD-10-CM | POA: Diagnosis not present

## 2017-01-03 DIAGNOSIS — J449 Chronic obstructive pulmonary disease, unspecified: Secondary | ICD-10-CM | POA: Diagnosis not present

## 2017-01-03 DIAGNOSIS — I110 Hypertensive heart disease with heart failure: Secondary | ICD-10-CM | POA: Diagnosis not present

## 2017-01-04 DIAGNOSIS — D509 Iron deficiency anemia, unspecified: Secondary | ICD-10-CM | POA: Diagnosis not present

## 2017-01-04 DIAGNOSIS — R0989 Other specified symptoms and signs involving the circulatory and respiratory systems: Secondary | ICD-10-CM | POA: Diagnosis not present

## 2017-01-04 DIAGNOSIS — K2210 Ulcer of esophagus without bleeding: Secondary | ICD-10-CM | POA: Diagnosis not present

## 2017-01-04 DIAGNOSIS — R195 Other fecal abnormalities: Secondary | ICD-10-CM | POA: Diagnosis not present

## 2017-01-04 DIAGNOSIS — J449 Chronic obstructive pulmonary disease, unspecified: Secondary | ICD-10-CM | POA: Diagnosis not present

## 2017-01-04 DIAGNOSIS — K222 Esophageal obstruction: Secondary | ICD-10-CM | POA: Diagnosis not present

## 2017-01-04 DIAGNOSIS — R1310 Dysphagia, unspecified: Secondary | ICD-10-CM | POA: Diagnosis not present

## 2017-01-07 ENCOUNTER — Ambulatory Visit: Payer: Medicare HMO | Admitting: Cardiovascular Disease

## 2017-01-08 DIAGNOSIS — J449 Chronic obstructive pulmonary disease, unspecified: Secondary | ICD-10-CM | POA: Diagnosis not present

## 2017-01-08 DIAGNOSIS — I4891 Unspecified atrial fibrillation: Secondary | ICD-10-CM | POA: Diagnosis not present

## 2017-01-08 DIAGNOSIS — M810 Age-related osteoporosis without current pathological fracture: Secondary | ICD-10-CM | POA: Diagnosis not present

## 2017-01-08 DIAGNOSIS — I509 Heart failure, unspecified: Secondary | ICD-10-CM | POA: Diagnosis not present

## 2017-01-08 DIAGNOSIS — E1151 Type 2 diabetes mellitus with diabetic peripheral angiopathy without gangrene: Secondary | ICD-10-CM | POA: Diagnosis not present

## 2017-01-08 DIAGNOSIS — I110 Hypertensive heart disease with heart failure: Secondary | ICD-10-CM | POA: Diagnosis not present

## 2017-01-08 DIAGNOSIS — Z7982 Long term (current) use of aspirin: Secondary | ICD-10-CM | POA: Diagnosis not present

## 2017-01-10 ENCOUNTER — Ambulatory Visit: Payer: Medicare HMO | Admitting: Cardiology

## 2017-01-10 DIAGNOSIS — Z7982 Long term (current) use of aspirin: Secondary | ICD-10-CM | POA: Diagnosis not present

## 2017-01-10 DIAGNOSIS — I509 Heart failure, unspecified: Secondary | ICD-10-CM | POA: Diagnosis not present

## 2017-01-10 DIAGNOSIS — I4891 Unspecified atrial fibrillation: Secondary | ICD-10-CM | POA: Diagnosis not present

## 2017-01-10 DIAGNOSIS — M810 Age-related osteoporosis without current pathological fracture: Secondary | ICD-10-CM | POA: Diagnosis not present

## 2017-01-10 DIAGNOSIS — I110 Hypertensive heart disease with heart failure: Secondary | ICD-10-CM | POA: Diagnosis not present

## 2017-01-10 DIAGNOSIS — J449 Chronic obstructive pulmonary disease, unspecified: Secondary | ICD-10-CM | POA: Diagnosis not present

## 2017-01-10 DIAGNOSIS — E1151 Type 2 diabetes mellitus with diabetic peripheral angiopathy without gangrene: Secondary | ICD-10-CM | POA: Diagnosis not present

## 2017-01-11 ENCOUNTER — Ambulatory Visit: Payer: Medicare HMO | Admitting: Cardiovascular Disease

## 2017-01-12 DIAGNOSIS — R0602 Shortness of breath: Secondary | ICD-10-CM | POA: Diagnosis not present

## 2017-01-12 DIAGNOSIS — I5022 Chronic systolic (congestive) heart failure: Secondary | ICD-10-CM | POA: Diagnosis not present

## 2017-01-16 ENCOUNTER — Ambulatory Visit: Payer: Medicare HMO | Admitting: Cardiology

## 2017-01-17 ENCOUNTER — Encounter: Payer: Self-pay | Admitting: Internal Medicine

## 2017-01-17 ENCOUNTER — Ambulatory Visit (INDEPENDENT_AMBULATORY_CARE_PROVIDER_SITE_OTHER): Payer: Medicare HMO | Admitting: Internal Medicine

## 2017-01-17 DIAGNOSIS — I5022 Chronic systolic (congestive) heart failure: Secondary | ICD-10-CM | POA: Diagnosis not present

## 2017-01-17 DIAGNOSIS — J449 Chronic obstructive pulmonary disease, unspecified: Secondary | ICD-10-CM

## 2017-01-17 LAB — PULMONARY FUNCTION TEST
DL/VA % PRED: 74 %
DL/VA: 3.68 ml/min/mmHg/L
DLCO COR: 14.13 ml/min/mmHg
DLCO UNC % PRED: 57 %
DLCO cor % pred: 55 %
DLCO unc: 14.71 ml/min/mmHg
FEF 25-75 Post: 1.04 L/sec
FEF 25-75 Pre: 0.88 L/sec
FEF2575-%Change-Post: 17 %
FEF2575-%Pred-Post: 67 %
FEF2575-%Pred-Pre: 57 %
FEV1-%CHANGE-POST: 6 %
FEV1-%Pred-Post: 79 %
FEV1-%Pred-Pre: 75 %
FEV1-Post: 1.66 L
FEV1-Pre: 1.57 L
FEV1FVC-%Change-Post: 9 %
FEV1FVC-%Pred-Pre: 91 %
FEV6-%Change-Post: -1 %
FEV6-%PRED-POST: 84 %
FEV6-%Pred-Pre: 86 %
FEV6-POST: 2.23 L
FEV6-PRE: 2.28 L
FEV6FVC-%CHANGE-POST: 1 %
FEV6FVC-%PRED-POST: 104 %
FEV6FVC-%PRED-PRE: 103 %
FVC-%Change-Post: -3 %
FVC-%Pred-Post: 80 %
FVC-%Pred-Pre: 83 %
FVC-Post: 2.24 L
FVC-Pre: 2.31 L
PRE FEV6/FVC RATIO: 99 %
Post FEV1/FVC ratio: 74 %
Post FEV6/FVC ratio: 100 %
Pre FEV1/FVC ratio: 68 %
RV % pred: 98 %
RV: 2.39 L
TLC % PRED: 85 %
TLC: 4.44 L

## 2017-01-17 NOTE — Patient Instructions (Signed)
You do not have significant copd and very unlikely you ever will unless you resume smoking   So pulmonary follow up is as needed

## 2017-01-17 NOTE — Progress Notes (Signed)
Subjective:     Patient ID: Krista Mason, female   DOB: February 11, 1936, 81 y.o.   MRN: [id number]  HPI  72 yowf quit smoking  10/2016 with onset of sob late Dec 2017 and ultimately admitted with new dx of chf +/- copd (though noted GOLD 0 criteria 01/17/2017    Admit date: 11/05/2016 Discharge date: 11/09/2016   Brief/Interim Summary: Patient is a 81 y.o. Caucasian women with a PMH significant for DM, MAT, CHF, and HTN. She was brought to the ED on 11/05/16 due to acute onset SOB. Pt. Has smoked for 40 years, current intake is about 1-2 cigarettes per day. She has never required an inhaler or been dx with COPD.  Assessment & Plan:   Acute respiratory failure with hypoxia (HCC)  - related to CHF exacerbation.  - CXR on admission showed evidence of pulmonary edema, repeat chest x-ray this morning with improvement - Received  IV lasix. Weight down to 125--from -133 -no leukocytosis  Patient will be discharge on lasix 40 mg daily.   Acute on chronic combined systolic and diastolic congestive heart failure ([place]):  - Possible etiology arrhythmia induced cardiomyopathy - Echocardiogram from 10/24/16 showed LV Ef: 35-40%. Had incessant PVCs throughout the study.  - Treated with  IV lasix 40 mg BID. She will be discharge on 40 daily. Weight down to 125--from -133. Negative 8 L.  She is feeling better today, breathing better.  cr mildly increase to 1.6. Needs b-met next week.   Multifocal PVCs with Moderate mitral regurgitation: -cardiology following, plan for PVC ablation outpatient.  -on coreg. Doses increased 1-18  CKD (chronic kidney disease), stage III  -stable, monitor on lasix.    Microcytic iron deficiency anemia  - As of 11/05/16: iron was 10, TIBC 489, saturation ratio 2 and ferritin 8.0.  - started iron supplements   Uncontrolled hypertension  - Continue losartan. Monitor renal function.   Diabetes mellitus without complication ([place]) - Last HgbA1C was 6.2 on  11/05/16. - Continue with SSI -hole metformin at discharge due to elevation in cr.  HLD (hyperlipidemia) - Continue statin.   Tobacco abuse - Recommend cessation    01/17/2017 1st [facility] office visit/ Krista Mason   Chief Complaint  Patient presents with  . Pulmonary Consult    Referred by Dr. Acie Fredrickson for possible COPD. Pt states had sudden onset SOB, cough and CP since 10/19/17. She states she gets SOB occ with exertion such as going shopping.   limited by back more than breathing but still able to walk flat/ slow anywhere she wants to go = MMRC 2  No tendency to flares or am cough/ congestion Never used any inhalers  No obvious day to day or daytime variability or assoc excess/ purulent sputum or mucus plugs or hemoptysis or ongoing cp or chest tightness, subjective wheeze or overt sinus or ongoing hb symptoms. No unusual exp hx or h/o childhood pna/ asthma or knowledge of premature birth.  Sleeping ok without nocturnal  or early am exacerbation  of respiratory  c/o's or need for noct saba. Also denies any obvious fluctuation of symptoms with weather or environmental changes or other aggravating or alleviating factors except as outlined above   Current Medications, Allergies, Complete Past Medical History, Past Surgical History, Family History, and Social History were reviewed in [employer] record.  ROS  The following are not active complaints unless bolded sore throat, dysphagia, dental problems, itching, sneezing,  nasal congestion or excess/ purulent secretions, ear  ache,   fever, chills, sweats, unintended wt loss, classically pleuritic or exertional cp,  orthopnea pnd or leg swelling, presyncope, palpitations, abdominal pain, anorexia, nausea, vomiting, diarrhea  or change in bowel or bladder habits, change in stools or urine, dysuria,hematuria,  rash, arthralgias, visual complaints, headache, numbness, weakness or ataxia or problems with walking or  coordination,  change in mood/affect or memory.         Review of Systems     Objective:   Physical Exam amb wf nad  Wt Readings from Last 3 Encounters:  01/17/17 129 lb (58.5 kg)  12/26/16 126 lb 9.6 oz (57.4 kg)  12/10/16 129 lb (58.5 kg)    Vital signs reviewed  - Note on arrival 02 sats  99% on RA and note BP 158/70    HEENT: nl dentition, turbinates bilaterally, and oropharynx. Nl external ear canals without cough reflex   NECK :  without JVD/Nodes/TM/ nl carotid upstrokes bilaterally   LUNGS: no acc muscle use,  Nl contour chest which is clear to A and P bilaterally without cough on insp or exp maneuvers   CV:  RRR  no s3 or murmur or increase in P2, and no edema   ABD:  soft and nontender with nl inspiratory excursion in the supine position. No bruits or organomegaly appreciated, bowel sounds nl  MS:  Nl gait/ ext warm without deformities, calf tenderness, cyanosis or clubbing No obvious joint restrictions   SKIN: warm and dry without lesions    NEURO:  alert, approp, nl sensorium with  no motor or cerebellar deficits apparent.     I personally reviewed images and agree with radiology impression as follows:  CXR:   11/06/16 Interval resolution of interstitial pulmonary edema with some minimal persistent bilateral pleural effusions.     Assessment:        

## 2017-01-17 NOTE — Progress Notes (Signed)
PFT done today. 

## 2017-01-17 NOTE — Assessment & Plan Note (Signed)
PFT's  01/17/2017  FEV1 1.66  (79 % ) ratio 74  p 6 % improvement from saba p nothing prior to study with DLCO  57/55 % corrects to 74  % for alv volume    As I explained to this patient in detail:  although there may be min copd present, it is not likely to  be clinically relevant:   it does not appear to be limiting activity tolerance any more than a set of worn tires limits someone from driving a car  around a parking lot.  A new set of Michelins might look good but would have no perceived impact on the performance of the car and would not be worth the cost.  That is to say:  I don't recommend aggressive pulmonary rx at this point unless limiting symptoms arise or acute exacerbations become as issue, neither of which is the case now.  I asked the patient to contact this office at any time in the future should either of these problems arise.    In additiona:  NB   When respiratory symptoms begin or become refractory well after a patient reports complete smoking cessation,  Especially when this wasn't the case while they were smoking, a red flag is raised based on the work of Dr Kris Mouton which states:  if you quit smoking when your best day FEV1 is still well preserved it is highly unlikely you will progress to severe disease.  That is to say, once the smoking stops,  the symptoms should not suddenly erupt or markedly worsen.  If so, the differential diagnosis should include  obesity/deconditioning,  LPR/Reflux/Aspiration syndromes,  occult CHF, or  especially side effect of medications commonly used in this population, esp amiodarone, acei and BB all of which may be needed in her future care    Therefore if starts having more sob would investigate above before any rx for copd is contemplated   Total time devoted to counseling  > 50 % of initial 60 min office visit:  review case with pt/daughter and daugher in law and  discussion of options/alternatives/ personally creating written customized  instructions  in presence of pt  then going over those specific  Instructions directly with the pt including how to use all of the meds but in particular covering each new medication in detail and the difference between the maintenance= "automatic" meds and the prns using an action plan format for the latter (If this problem/symptom => do that organization reading Left to right).  Please see AVS from this visit for a full list of these instructions which I personally wrote for this pt and  are unique to this visit.

## 2017-01-23 ENCOUNTER — Ambulatory Visit (HOSPITAL_BASED_OUTPATIENT_CLINIC_OR_DEPARTMENT_OTHER)
Admission: RE | Admit: 2017-01-23 | Discharge: 2017-01-23 | Disposition: A | Discharge status: Home / Self Care | Payer: Medicare HMO | Source: Ambulatory Visit | Attending: Internal Medicine | Admitting: Internal Medicine

## 2017-01-23 ENCOUNTER — Encounter (HOSPITAL_COMMUNITY): Payer: Self-pay | Admitting: *Deleted

## 2017-01-23 ENCOUNTER — Ambulatory Visit (HOSPITAL_COMMUNITY)
Admission: RE | Admit: 2017-01-23 | Discharge: 2017-01-23 | Disposition: A | Discharge status: Home / Self Care | Payer: Medicare HMO | Source: Ambulatory Visit | Attending: Internal Medicine | Admitting: Internal Medicine

## 2017-01-23 ENCOUNTER — Encounter (HOSPITAL_COMMUNITY): Payer: Self-pay | Admitting: Internal Medicine

## 2017-01-23 ENCOUNTER — Other Ambulatory Visit (HOSPITAL_COMMUNITY): Payer: Self-pay | Admitting: *Deleted

## 2017-01-23 VITALS — BP 132/90 | HR 68 | Wt 130.5 lb

## 2017-01-23 DIAGNOSIS — Z7982 Long term (current) use of aspirin: Secondary | ICD-10-CM | POA: Insufficient documentation

## 2017-01-23 DIAGNOSIS — I5022 Chronic systolic (congestive) heart failure: Secondary | ICD-10-CM | POA: Insufficient documentation

## 2017-01-23 DIAGNOSIS — I130 Hypertensive heart and chronic kidney disease with heart failure and stage 1 through stage 4 chronic kidney disease, or unspecified chronic kidney disease: Secondary | ICD-10-CM | POA: Diagnosis not present

## 2017-01-23 DIAGNOSIS — I517 Cardiomegaly: Secondary | ICD-10-CM | POA: Diagnosis not present

## 2017-01-23 DIAGNOSIS — E1122 Type 2 diabetes mellitus with diabetic chronic kidney disease: Secondary | ICD-10-CM | POA: Diagnosis not present

## 2017-01-23 DIAGNOSIS — E785 Hyperlipidemia, unspecified: Secondary | ICD-10-CM | POA: Insufficient documentation

## 2017-01-23 DIAGNOSIS — Z87891 Personal history of nicotine dependence: Secondary | ICD-10-CM | POA: Insufficient documentation

## 2017-01-23 DIAGNOSIS — N183 Chronic kidney disease, stage 3 unspecified: Secondary | ICD-10-CM

## 2017-01-23 DIAGNOSIS — I4891 Unspecified atrial fibrillation: Secondary | ICD-10-CM | POA: Insufficient documentation

## 2017-01-23 DIAGNOSIS — J449 Chronic obstructive pulmonary disease, unspecified: Secondary | ICD-10-CM | POA: Diagnosis not present

## 2017-01-23 DIAGNOSIS — G4730 Sleep apnea, unspecified: Secondary | ICD-10-CM | POA: Diagnosis not present

## 2017-01-23 DIAGNOSIS — I493 Ventricular premature depolarization: Secondary | ICD-10-CM

## 2017-01-23 LAB — BASIC METABOLIC PANEL
ANION GAP: 13 (ref 5–15)
BUN: 23 mg/dL — AB (ref 6–20)
CALCIUM: 10.1 mg/dL (ref 8.9–10.3)
CO2: 26 mmol/L (ref 22–32)
CREATININE: 1.5 mg/dL — AB (ref 0.44–1.00)
Chloride: 99 mmol/L — ABNORMAL LOW (ref 101–111)
GFR calc non Af Amer: 32 mL/min — ABNORMAL LOW (ref >60)
GFR, EST AFRICAN AMERICAN: 37 mL/min — AB (ref >60)
Glucose, Bld: 125 mg/dL — ABNORMAL HIGH (ref 65–99)
Potassium: 4.5 mmol/L (ref 3.5–5.1)
SODIUM: 138 mmol/L (ref 135–145)

## 2017-01-23 LAB — CBC
HCT: 37.6 % (ref 36.0–46.0)
Hemoglobin: 12.2 g/dL (ref 12.0–15.0)
MCH: 26.8 pg (ref 26.0–34.0)
MCHC: 32.4 g/dL (ref 30.0–36.0)
MCV: 82.6 fL (ref 78.0–100.0)
PLATELETS: 206 10*3/uL (ref 150–400)
RBC: 4.55 MIL/uL (ref 3.87–5.11)
RDW: 19.6 % — AB (ref 11.5–15.5)
WBC: 7.1 10*3/uL (ref 4.0–10.5)

## 2017-01-23 NOTE — Progress Notes (Signed)
  Echocardiogram 2D Echocardiogram has been performed.  Jennette Dubin 01/23/2017, 11:11 AM

## 2017-01-23 NOTE — Patient Instructions (Signed)
Labs today  Your physician has recommended that you wear a holter monitor. Holter monitors are medical devices that record the heart's electrical activity. Doctors most often use these monitors to diagnose arrhythmias. Arrhythmias are problems with the speed or rhythm of the heartbeat. The monitor is a small, portable device. You can wear one while you do your normal daily activities. This is usually used to diagnose what is causing palpitations/syncope (passing out).  Heart Catheterization on Friday 01/25/17, see instruction sheet  Your physician recommends that you schedule a follow-up appointment in: 6 weeks

## 2017-01-23 NOTE — Progress Notes (Signed)
Advanced Heart Failure Clinic Note   Referring Physician: Dr. Acie Fredrickson Primary Care: Leighton Ruff, MD Primary Cardiologist: Dr. Acie Fredrickson EP: Dr. Ileana Ladd HF: New (Dr. Haroldine Laws)  HPI:  Krista Mason is a 81 y.o. female with hx of HTN, PVCs, HL, DM2, CKD, histoplasmosis, and tobacco abuse.   Diagnosed with CHF 10/19/17 with symptoms of DOE and orthopnea. CXR with cardiomegaly at that time and started on po lasix.   Echo 10/24/16 LVEF 35-40%, Grade 2 DD, Moderate LAE, mild AI, Moderate MR.  NST low risk with no evidence of ischemia or infarction, but frequent PVCs.  48 hr Holter monitor placed 10/23/16 with 29% multifocal PVCs. Felt to be etiology of cardiomyopathy.   Tried amio initially for a few days the decided to go with ablation, S/p PVC ablation by Dr. Curt Bears 11/30/16   PFTs 01/17/17  FEV1 1.57L FVC   2.31L  FEV1/FVC 68% -> 74% with bronchodilators DLCO 55%  CT san chest 9/17: COPD. Granulomatous disease. +coronary Ca2+  Echo 25-30%. RV ok  bigmeiny   Pt presents today for f/u. Says she feels much better than she did 2 months ago. Says last week she had an episode of tachypalpitations with heart beating out of her chest. Lasted about 5 hours. Felt weak no CP. Doesn't go out of house much. Walking to mailbox every day. Can do ADLs without SOB. No edema, orthopnea or PND. Continues to feel weak and fatigued. Weight at home 129 (previously 126-127). Eating more.  Taking lasix 40 mg daily.    Past Medical History:  Diagnosis Date  . Atrial fibrillation ([place])   . Chronic combined systolic and diastolic CHF (congestive heart failure) ([place])    a. LV dysfcuntion felt to be related to PVCs  . Chronic kidney disease   . Diabetes mellitus without complication ([place])   . Hx of echocardiogram    Echo (1/16):  EF 60-65%, no RWMA, Gr 1 DD, mod AI, mod MR, mild LAE  . Hyperkalemia   . Hyperlipidemia   . Hypertension   . Orthostatic hypotension   . PVC (premature ventricular  contraction)    a. s/p PVC ablation in 11/2016  . Rheumatic fever   . Sleep apnea     Current Outpatient Prescriptions  Medication Sig Dispense Refill  . acetaminophen (TYLENOL) 325 MG tablet Take 325 mg by mouth every 6 (six) hours as needed for mild pain. For pain     . aspirin 81 MG tablet Take 1 tablet (81 mg total) by mouth daily. 30 tablet 11  . atorvastatin (LIPITOR) 20 MG tablet Take 1 tablet (20 mg total) by mouth daily. 30 tablet 4  . carvedilol (COREG) 6.25 MG tablet Take 1.5 tablets (9.375 mg total) by mouth 2 (two) times daily with a meal. 90 tablet 3  . cholecalciferol (VITAMIN D) 1000 UNITS tablet Take 1,000 Units by mouth daily.     [name] denosumab (PROLIA) 60 MG/ML SOLN injection Inject 60 mg into the skin every 6 (six) months. Administer in upper arm, thigh, or abdomen    . ferrous gluconate (FERGON) 324 MG tablet Take 1 tablet (324 mg total) by mouth daily with breakfast. 30 tablet 1  . furosemide (LASIX) 40 MG tablet TAKE 1 TABLET BY MOUTH DAILY 30 tablet 6  . losartan (COZAAR) 50 MG tablet Take 1 tablet (50 mg total) by mouth daily. 30 tablet 3  . Multiple Vitamin (MULTIVITAMIN) tablet Take 1 tablet by mouth daily.    [name] omeprazole (PRILOSEC) 40  MG capsule 40mg  by mouth twice daily  4  . Polyethyl Glycol-Propyl Glycol (SYSTANE OP) Place 1 drop into both eyes 2 (two) times daily as needed (dryness).     No current facility-administered medications for this encounter.     Allergies  Allergen Reactions  . Penicillins Itching and Rash    Amoxicillin ok- IM pen is what gives reaction  Has patient had a PCN reaction causing immediate rash, facial/tongue/throat swelling, SOB or lightheadedness with hypotension: no Has patient had a PCN reaction causing severe rash involving mucus membranes or skin necrosis: no Has patient had a PCN reaction that required hospitalization {no Has patient had a PCN reaction occurring within the last 10 years: {no If all of the above answers are  "NO", then may proceed with Cephalosporin use.  [name] [Cefaclor] Itching  . Crestor [Rosuvastatin Calcium] Nausea Only  . Epinephrine Other (See Comments)    "feels like heart is beating out of chest"  . Nsaids     Pt states she is not taking because of kidney function  . Other Other (See Comments)    FLU SHOT-swelling, redness, fever  . Percocet [Oxycodone-Acetaminophen] Nausea And Vomiting  . Prednisone     Dizziness, spiked blood sugar      Social History   Social History  . Marital status: Widowed    Spouse name: N/A  . Number of children: N/A  . Years of education: N/A   Occupational History  . Not on file.   Social History Main Topics  . Smoking status: Former Smoker    Packs/day: 0.75    Years: 60.00    Quit date: 11/04/2016  . Smokeless tobacco: Never Used  . Alcohol use No  . Drug use: No  . Sexual activity: Not on file   Other Topics Concern  . Not on file   Social History Narrative  . No narrative on file      Family History  Problem Relation Age of Onset  . Heart attack Father   . Heart disease Father   . Cancer Father   . Hypertension Father   . Hypertension Brother   . Stroke Brother   . Hypertension Brother   . Diabetes Son   . Hyperlipidemia Son   . Hypertension Son     Vitals:   01/23/17 1054  BP: 132/90  Pulse: 68  SpO2: 100%  Weight: 130 lb 8 oz (59.2 kg)     PHYSICAL EXAM: General:  Elderly appearing, NAD. Ambulated into clinic without difficulty. Daughter present  HEENT: Normal Neck: supple. JVP 5-6 Carotids 2+ bilat; no bruits. No thyromegaly or nodule noted.  Cor: PMI nondisplaced. Regularly irregular with bigeminy. No M/G/R appreciated.  Lungs: Decreased breath sounds throughout. No wheeze Abdomen: soft, nontender, nondistendedNo hepatosplenomegaly. No bruits or masses. Good bowel sounds. Extremities: no clubbing, rash. No edema. + peripheral cyanosis in feet Neuro: alert & oriented x 3, cranial nerves grossly  intact. moves all 4 extremities w/o difficulty. Affect pleasant.    ASSESSMENT & PLAN:  1. Chronic combined CHF - LVEF 35-40% echo 10/2016 with Grade 2 DD - Volume status ok. NYHA II-III - I reviewed echo today in clinic and EF appears worse to me 30-35%. Echo notable for bigeminy. - I agree that etiology of cardiomyopathy is likely PVC induced but she is a former smoker and has significant coronary calcification on CT images that I reviewed - Will plan R/L heart cath this week to further assess for underlying  CAD - Continue lasix 40 mg daily with extra as needed. - Continue current HF meds 2. PVCs - 48 Holter with 29% focal PVCs. Failed amio after only brief trial - No s/p PVC ablation c/b LBBB - She is in bigmeniny today. Will repeat 48-hour Holter and discuss with Dr. Curt Bears. Ma need to try mexilitene - I explained to her that it is not unusual for another dominant PVCs focus to arise after a PVC ablation at another focus 3. Valvular disease - Stable on echo.   Mild AI and Moderate MR.  4. CKD III - Follow closely with any med adjustments.  5. COPD - I reviewed her CT images and PFTs with her and her daughter personally. They tell me they were told that she likely does not had emphysema. However the raw data does suggest that she as at least mild COPD with minimal response to bronchodilators. This currently does not seem to be a major limiting factor for her.   Total time spent 45 minutes. Over half that time spent discussing above.   Glori Bickers, MD 12/10/16

## 2017-01-25 ENCOUNTER — Telehealth: Payer: Self-pay | Admitting: Cardiology

## 2017-01-25 ENCOUNTER — Encounter (HOSPITAL_COMMUNITY)
Admission: RE | Disposition: A | Discharge status: Home / Self Care | Payer: Self-pay | Source: Ambulatory Visit | Attending: Internal Medicine

## 2017-01-25 ENCOUNTER — Encounter (HOSPITAL_COMMUNITY): Payer: Self-pay | Admitting: Internal Medicine

## 2017-01-25 ENCOUNTER — Ambulatory Visit (HOSPITAL_COMMUNITY)
Admission: RE | Admit: 2017-01-25 | Discharge: 2017-01-25 | Disposition: A | Discharge status: Home / Self Care | Payer: Medicare HMO | Source: Ambulatory Visit | Attending: Internal Medicine | Admitting: Internal Medicine

## 2017-01-25 DIAGNOSIS — N183 Chronic kidney disease, stage 3 (moderate): Secondary | ICD-10-CM | POA: Insufficient documentation

## 2017-01-25 DIAGNOSIS — I5022 Chronic systolic (congestive) heart failure: Secondary | ICD-10-CM | POA: Diagnosis not present

## 2017-01-25 DIAGNOSIS — Z7982 Long term (current) use of aspirin: Secondary | ICD-10-CM | POA: Diagnosis not present

## 2017-01-25 DIAGNOSIS — Z880 Allergy status to penicillin: Secondary | ICD-10-CM | POA: Insufficient documentation

## 2017-01-25 DIAGNOSIS — I2510 Atherosclerotic heart disease of native coronary artery without angina pectoris: Secondary | ICD-10-CM | POA: Diagnosis not present

## 2017-01-25 DIAGNOSIS — I5042 Chronic combined systolic (congestive) and diastolic (congestive) heart failure: Secondary | ICD-10-CM | POA: Diagnosis not present

## 2017-01-25 DIAGNOSIS — Z87891 Personal history of nicotine dependence: Secondary | ICD-10-CM | POA: Insufficient documentation

## 2017-01-25 DIAGNOSIS — Z8249 Family history of ischemic heart disease and other diseases of the circulatory system: Secondary | ICD-10-CM | POA: Diagnosis not present

## 2017-01-25 DIAGNOSIS — I2584 Coronary atherosclerosis due to calcified coronary lesion: Secondary | ICD-10-CM | POA: Insufficient documentation

## 2017-01-25 DIAGNOSIS — E785 Hyperlipidemia, unspecified: Secondary | ICD-10-CM | POA: Diagnosis not present

## 2017-01-25 DIAGNOSIS — G4730 Sleep apnea, unspecified: Secondary | ICD-10-CM | POA: Insufficient documentation

## 2017-01-25 DIAGNOSIS — E1122 Type 2 diabetes mellitus with diabetic chronic kidney disease: Secondary | ICD-10-CM | POA: Insufficient documentation

## 2017-01-25 DIAGNOSIS — J449 Chronic obstructive pulmonary disease, unspecified: Secondary | ICD-10-CM | POA: Diagnosis not present

## 2017-01-25 DIAGNOSIS — I4891 Unspecified atrial fibrillation: Secondary | ICD-10-CM | POA: Diagnosis not present

## 2017-01-25 DIAGNOSIS — I130 Hypertensive heart and chronic kidney disease with heart failure and stage 1 through stage 4 chronic kidney disease, or unspecified chronic kidney disease: Secondary | ICD-10-CM | POA: Diagnosis not present

## 2017-01-25 HISTORY — PX: RIGHT/LEFT HEART CATH AND CORONARY ANGIOGRAPHY: CATH118266

## 2017-01-25 LAB — POCT I-STAT 3, VENOUS BLOOD GAS (G3P V)
ACID-BASE EXCESS: 1 mmol/L (ref 0.0–2.0)
ACID-BASE EXCESS: 1 mmol/L (ref 0.0–2.0)
Bicarbonate: 25.6 mmol/L (ref 20.0–28.0)
Bicarbonate: 25.8 mmol/L (ref 20.0–28.0)
O2 SAT: 71 %
O2 Saturation: 68 %
PCO2 VEN: 40.2 mmHg — AB (ref 44.0–60.0)
PH VEN: 7.407 (ref 7.250–7.430)
PO2 VEN: 35 mmHg (ref 32.0–45.0)
TCO2: 27 mmol/L (ref 0–100)
TCO2: 27 mmol/L (ref 0–100)
pCO2, Ven: 40.6 mmHg — ABNORMAL LOW (ref 44.0–60.0)
pH, Ven: 7.416 (ref 7.250–7.430)
pO2, Ven: 37 mmHg (ref 32.0–45.0)

## 2017-01-25 LAB — GLUCOSE, CAPILLARY: GLUCOSE-CAPILLARY: 111 mg/dL — AB (ref 65–99)

## 2017-01-25 LAB — POCT I-STAT 3, ART BLOOD GAS (G3+)
Acid-base deficit: 2 mmol/L (ref 0.0–2.0)
Bicarbonate: 22.2 mmol/L (ref 20.0–28.0)
O2 Saturation: 94 %
PH ART: 7.427 (ref 7.350–7.450)
TCO2: 23 mmol/L (ref 0–100)
pCO2 arterial: 33.7 mmHg (ref 32.0–48.0)
pO2, Arterial: 67 mmHg — ABNORMAL LOW (ref 83.0–108.0)

## 2017-01-25 LAB — PROTIME-INR
INR: 1.04
PROTHROMBIN TIME: 13.7 s (ref 11.4–15.2)

## 2017-01-25 SURGERY — RIGHT/LEFT HEART CATH AND CORONARY ANGIOGRAPHY
Anesthesia: LOCAL

## 2017-01-25 MED ORDER — HEPARIN SODIUM (PORCINE) 1000 UNIT/ML IJ SOLN
INTRAMUSCULAR | Status: DC | PRN
  Administered 2017-01-25: 3000 [IU] via INTRAVENOUS

## 2017-01-25 MED ORDER — VERAPAMIL HCL 2.5 MG/ML IV SOLN
INTRAVENOUS | Status: AC
  Filled 2017-01-25: qty 2

## 2017-01-25 MED ORDER — ACETAMINOPHEN 325 MG PO TABS: 650.0000 mg | ORAL_TABLET | ORAL | Status: DC | PRN

## 2017-01-25 MED ORDER — HEPARIN (PORCINE) IN NACL 2-0.9 UNIT/ML-% IJ SOLN
INTRAMUSCULAR | Status: DC | PRN
  Administered 2017-01-25: 1000 mL

## 2017-01-25 MED ORDER — SODIUM CHLORIDE 0.9 % IV SOLN: 250.0000 mL | INTRAVENOUS | Status: DC | PRN

## 2017-01-25 MED ORDER — ONDANSETRON HCL 4 MG/2ML IJ SOLN: 4.0000 mg | Freq: Four times a day (QID) | INTRAMUSCULAR | Status: DC | PRN

## 2017-01-25 MED ORDER — FENTANYL CITRATE (PF) 100 MCG/2ML IJ SOLN
INTRAMUSCULAR | Status: AC
  Filled 2017-01-25: qty 2

## 2017-01-25 MED ORDER — SODIUM CHLORIDE 0.9% FLUSH: 3.0000 mL | Freq: Two times a day (BID) | INTRAVENOUS | Status: DC

## 2017-01-25 MED ORDER — IOPAMIDOL (ISOVUE-370) INJECTION 76%
INTRAVENOUS | Status: AC
  Filled 2017-01-25: qty 100

## 2017-01-25 MED ORDER — FENTANYL CITRATE (PF) 100 MCG/2ML IJ SOLN
INTRAMUSCULAR | Status: DC | PRN
  Administered 2017-01-25: 25 ug via INTRAVENOUS

## 2017-01-25 MED ORDER — MIDAZOLAM HCL 2 MG/2ML IJ SOLN
INTRAMUSCULAR | Status: AC
  Filled 2017-01-25: qty 2

## 2017-01-25 MED ORDER — IOPAMIDOL (ISOVUE-370) INJECTION 76%
INTRAVENOUS | Status: DC | PRN
  Administered 2017-01-25: 50 mL

## 2017-01-25 MED ORDER — SODIUM CHLORIDE 0.9 % IV SOLN: INTRAVENOUS | Status: AC

## 2017-01-25 MED ORDER — HEPARIN (PORCINE) IN NACL 2-0.9 UNIT/ML-% IJ SOLN
INTRAMUSCULAR | Status: AC
  Filled 2017-01-25: qty 1000

## 2017-01-25 MED ORDER — SODIUM CHLORIDE 0.9% FLUSH: 3.0000 mL | INTRAVENOUS | Status: DC | PRN

## 2017-01-25 MED ORDER — LIDOCAINE HCL (PF) 1 % IJ SOLN
INTRAMUSCULAR | Status: DC | PRN
  Administered 2017-01-25 (×2): 1 mL

## 2017-01-25 MED ORDER — HEPARIN SODIUM (PORCINE) 1000 UNIT/ML IJ SOLN
INTRAMUSCULAR | Status: AC
  Filled 2017-01-25: qty 1

## 2017-01-25 MED ORDER — MIDAZOLAM HCL 2 MG/2ML IJ SOLN
INTRAMUSCULAR | Status: DC | PRN
  Administered 2017-01-25: 1 mg via INTRAVENOUS

## 2017-01-25 MED ORDER — LIDOCAINE HCL (PF) 1 % IJ SOLN
INTRAMUSCULAR | Status: AC
  Filled 2017-01-25: qty 30

## 2017-01-25 MED ORDER — SODIUM CHLORIDE 0.9 % IV SOLN
INTRAVENOUS | Status: DC
  Administered 2017-01-25: 07:00:00 via INTRAVENOUS

## 2017-01-25 MED ORDER — VERAPAMIL HCL 2.5 MG/ML IV SOLN
INTRAVENOUS | Status: DC | PRN
  Administered 2017-01-25: 09:00:00 via INTRA_ARTERIAL

## 2017-01-25 SURGICAL SUPPLY — 17 items
CATH 5FR JL3.5 JR4 ANG PIG MP (CATHETERS) ×2 IMPLANT
CATH BALLN WEDGE 5F 110CM (CATHETERS) ×2 IMPLANT
CATH SITESEER 5F NTR (CATHETERS) ×2 IMPLANT
CATH SWAN GANZ 7F STRAIGHT (CATHETERS) ×2 IMPLANT
DEVICE RAD COMP TR BAND LRG (VASCULAR PRODUCTS) ×2 IMPLANT
GLIDESHEATH SLEND SS 6F .021 (SHEATH) ×2 IMPLANT
GUIDEWIRE INQWIRE 1.5J.035X260 (WIRE) ×1 IMPLANT
INQWIRE 1.5J .035X260CM (WIRE) ×2
KIT HEART LEFT (KITS) ×2 IMPLANT
PACK CARDIAC CATHETERIZATION (CUSTOM PROCEDURE TRAY) ×2 IMPLANT
SHEATH GLIDE SLENDER 4/5FR (SHEATH) ×2 IMPLANT
TRANSDUCER W/STOPCOCK (MISCELLANEOUS) ×2 IMPLANT
TUBING ART PRESS 72  MALE/FEM (TUBING) ×1
TUBING ART PRESS 72 MALE/FEM (TUBING) ×1 IMPLANT
TUBING CIL FLEX 10 FLL-RA (TUBING) ×2 IMPLANT
WIRE HI TORQ VERSACORE-J 145CM (WIRE) ×2 IMPLANT
WIRE SPARTACORE .014X190CM (WIRE) ×2 IMPLANT

## 2017-01-25 NOTE — Progress Notes (Signed)
Dr Haroldine Laws notified of client c/o feeling lightheaded and no new orders

## 2017-01-25 NOTE — Interval H&P Note (Signed)
History and Physical Interval Note:  01/25/2017 8:16 AM  Krista Mason  has presented today for surgery, with the diagnosis of hf  The various methods of treatment have been discussed with the patient and family. After consideration of risks, benefits and other options for treatment, the patient has consented to  Procedure(s): Right/Left Heart Cath and Coronary Angiography (N/A) and possible angioplasty as a surgical intervention .  The patient's history has been reviewed, patient examined, no change in status, stable for surgery.  I have reviewed the patient's chart and labs.  Questions were answered to the patient's satisfaction.     Bensimhon, Quillian Quince

## 2017-01-25 NOTE — Progress Notes (Signed)
Client and her son state they feel ok to go home and advised client and her son they can go to the emergency room at anytime and they voiced understanding

## 2017-01-25 NOTE — H&P (View-Only) (Signed)
Advanced Heart Failure Clinic Note   Referring Physician: Dr. Acie Fredrickson Primary Care: Leighton Ruff, MD Primary Cardiologist: Dr. Acie Fredrickson EP: Dr. Ileana Ladd HF: New (Dr. Haroldine Laws)  HPI:  Krista Mason is a 81 y.o. female with hx of HTN, PVCs, HL, DM2, CKD, histoplasmosis, and tobacco abuse.   Diagnosed with CHF 10/19/17 with symptoms of DOE and orthopnea. CXR with cardiomegaly at that time and started on po lasix.   Echo 10/24/16 LVEF 35-40%, Grade 2 DD, Moderate LAE, mild AI, Moderate MR.  NST low risk with no evidence of ischemia or infarction, but frequent PVCs.  48 hr Holter monitor placed 10/23/16 with 29% multifocal PVCs. Felt to be etiology of cardiomyopathy.   Tried amio initially for a few days the decided to go with ablation, S/p PVC ablation by Dr. Curt Bears 11/30/16   PFTs 01/17/17  FEV1 1.57L FVC   2.31L  FEV1/FVC 68% -> 74% with bronchodilators DLCO 55%  CT san chest 9/17: COPD. Granulomatous disease. +coronary Ca2+  Echo 25-30%. RV ok  bigmeiny   Pt presents today for f/u. Says she feels much better than she did 2 months ago. Says last week she had an episode of tachypalpitations with heart beating out of her chest. Lasted about 5 hours. Felt weak no CP. Doesn't go out of house much. Walking to mailbox every day. Can do ADLs without SOB. No edema, orthopnea or PND. Continues to feel weak and fatigued. Weight at home 129 (previously 126-127). Eating more.  Taking lasix 40 mg daily.    Past Medical History:  Diagnosis Date  . Atrial fibrillation ([place])   . Chronic combined systolic and diastolic CHF (congestive heart failure) ([place])    a. LV dysfcuntion felt to be related to PVCs  . Chronic kidney disease   . Diabetes mellitus without complication ([place])   . Hx of echocardiogram    Echo (1/16):  EF 60-65%, no RWMA, Gr 1 DD, mod AI, mod MR, mild LAE  . Hyperkalemia   . Hyperlipidemia   . Hypertension   . Orthostatic hypotension   . PVC (premature ventricular  contraction)    a. s/p PVC ablation in 11/2016  . Rheumatic fever   . Sleep apnea     Current Outpatient Prescriptions  Medication Sig Dispense Refill  . acetaminophen (TYLENOL) 325 MG tablet Take 325 mg by mouth every 6 (six) hours as needed for mild pain. For pain     . aspirin 81 MG tablet Take 1 tablet (81 mg total) by mouth daily. 30 tablet 11  . atorvastatin (LIPITOR) 20 MG tablet Take 1 tablet (20 mg total) by mouth daily. 30 tablet 4  . carvedilol (COREG) 6.25 MG tablet Take 1.5 tablets (9.375 mg total) by mouth 2 (two) times daily with a meal. 90 tablet 3  . cholecalciferol (VITAMIN D) 1000 UNITS tablet Take 1,000 Units by mouth daily.     [name] denosumab (PROLIA) 60 MG/ML SOLN injection Inject 60 mg into the skin every 6 (six) months. Administer in upper arm, thigh, or abdomen    . ferrous gluconate (FERGON) 324 MG tablet Take 1 tablet (324 mg total) by mouth daily with breakfast. 30 tablet 1  . furosemide (LASIX) 40 MG tablet TAKE 1 TABLET BY MOUTH DAILY 30 tablet 6  . losartan (COZAAR) 50 MG tablet Take 1 tablet (50 mg total) by mouth daily. 30 tablet 3  . Multiple Vitamin (MULTIVITAMIN) tablet Take 1 tablet by mouth daily.    [name] omeprazole (PRILOSEC) 40  MG capsule 40mg  by mouth twice daily  4  . Polyethyl Glycol-Propyl Glycol (SYSTANE OP) Place 1 drop into both eyes 2 (two) times daily as needed (dryness).     No current facility-administered medications for this encounter.     Allergies  Allergen Reactions  . Penicillins Itching and Rash    Amoxicillin ok- IM pen is what gives reaction  Has patient had a PCN reaction causing immediate rash, facial/tongue/throat swelling, SOB or lightheadedness with hypotension: no Has patient had a PCN reaction causing severe rash involving mucus membranes or skin necrosis: no Has patient had a PCN reaction that required hospitalization {no Has patient had a PCN reaction occurring within the last 10 years: {no If all of the above answers are  "NO", then may proceed with Cephalosporin use.  [name] [Cefaclor] Itching  . Crestor [Rosuvastatin Calcium] Nausea Only  . Epinephrine Other (See Comments)    "feels like heart is beating out of chest"  . Nsaids     Pt states she is not taking because of kidney function  . Other Other (See Comments)    FLU SHOT-swelling, redness, fever  . Percocet [Oxycodone-Acetaminophen] Nausea And Vomiting  . Prednisone     Dizziness, spiked blood sugar      Social History   Social History  . Marital status: Widowed    Spouse name: N/A  . Number of children: N/A  . Years of education: N/A   Occupational History  . Not on file.   Social History Main Topics  . Smoking status: Former Smoker    Packs/day: 0.75    Years: 60.00    Quit date: 11/04/2016  . Smokeless tobacco: Never Used  . Alcohol use No  . Drug use: No  . Sexual activity: Not on file   Other Topics Concern  . Not on file   Social History Narrative  . No narrative on file      Family History  Problem Relation Age of Onset  . Heart attack Father   . Heart disease Father   . Cancer Father   . Hypertension Father   . Hypertension Brother   . Stroke Brother   . Hypertension Brother   . Diabetes Son   . Hyperlipidemia Son   . Hypertension Son     Vitals:   01/23/17 1054  BP: 132/90  Pulse: 68  SpO2: 100%  Weight: 130 lb 8 oz (59.2 kg)     PHYSICAL EXAM: General:  Elderly appearing, NAD. Ambulated into clinic without difficulty. Daughter present  HEENT: Normal Neck: supple. JVP 5-6 Carotids 2+ bilat; no bruits. No thyromegaly or nodule noted.  Cor: PMI nondisplaced. Regularly irregular with bigeminy. No M/G/R appreciated.  Lungs: Decreased breath sounds throughout. No wheeze Abdomen: soft, nontender, nondistendedNo hepatosplenomegaly. No bruits or masses. Good bowel sounds. Extremities: no clubbing, rash. No edema. + peripheral cyanosis in feet Neuro: alert & oriented x 3, cranial nerves grossly  intact. moves all 4 extremities w/o difficulty. Affect pleasant.    ASSESSMENT & PLAN:  1. Chronic combined CHF - LVEF 35-40% echo 10/2016 with Grade 2 DD - Volume status ok. NYHA II-III - I reviewed echo today in clinic and EF appears worse to me 30-35%. Echo notable for bigeminy. - I agree that etiology of cardiomyopathy is likely PVC induced but she is a former smoker and has significant coronary calcification on CT images that I reviewed - Will plan R/L heart cath this week to further assess for underlying  CAD - Continue lasix 40 mg daily with extra as needed. - Continue current HF meds 2. PVCs - 48 Holter with 29% focal PVCs. Failed amio after only brief trial - No s/p PVC ablation c/b LBBB - She is in bigmeniny today. Will repeat 48-hour Holter and discuss with Dr. Curt Bears. Ma need to try mexilitene - I explained to her that it is not unusual for another dominant PVCs focus to arise after a PVC ablation at another focus 3. Valvular disease - Stable on echo.   Mild AI and Moderate MR.  4. CKD III - Follow closely with any med adjustments.  5. COPD - I reviewed her CT images and PFTs with her and her daughter personally. They tell me they were told that she likely does not had emphysema. However the raw data does suggest that she as at least mild COPD with minimal response to bronchodilators. This currently does not seem to be a major limiting factor for her.   Total time spent 45 minutes. Over half that time spent discussing above.   Glori Bickers, MD 12/10/16

## 2017-01-25 NOTE — Discharge Instructions (Signed)
Radial Site Care °Refer to this sheet in the next few weeks. These instructions provide you with information about caring for yourself after your procedure. Your health care provider may also give you more specific instructions. Your treatment has been planned according to current medical practices, but problems sometimes occur. Call your health care provider if you have any problems or questions after your procedure. °What can I expect after the procedure? °After your procedure, it is typical to have the following: °· Bruising at the radial site that usually fades within 1-2 weeks. °· Blood collecting in the tissue (hematoma) that may be painful to the touch. It should usually decrease in size and tenderness within 1-2 weeks. °Follow these instructions at home: °· Take medicines only as directed by your health care provider. °· You may shower 24-48 hours after the procedure or as directed by your health care provider. Remove the bandage (dressing) and gently wash the site with plain soap and water. Pat the area dry with a clean towel. Do not rub the site, because this may cause bleeding. °· Do not take baths, swim, or use a hot tub until your health care provider approves. °· Check your insertion site every day for redness, swelling, or drainage. °· Do not apply powder or lotion to the site. °· Do not flex or bend the affected arm for 24 hours or as directed by your health care provider. °· Do not push or pull heavy objects with the affected arm for 24 hours or as directed by your health care provider. °· Do not lift over 10 lb (4.5 kg) for 5 days after your procedure or as directed by your health care provider. °· Ask your health care provider when it is okay to: °¨ Return to work or school. °¨ Resume usual physical activities or sports. °¨ Resume sexual activity. °· Do not drive home if you are discharged the same day as the procedure. Have someone else drive you. °· You may drive 24 hours after the procedure  unless otherwise instructed by your health care provider. °· Do not operate machinery or power tools for 24 hours after the procedure. °· If your procedure was done as an outpatient procedure, which means that you went home the same day as your procedure, a responsible adult should be with you for the first 24 hours after you arrive home. °· Keep all follow-up visits as directed by your health care provider. This is important. °Contact a health care provider if: °· You have a fever. °· You have chills. °· You have increased bleeding from the radial site. Hold pressure on the site. °Get help right away if: °· You have unusual pain at the radial site. °· You have redness, warmth, or swelling at the radial site. °· You have drainage (other than a small amount of blood on the dressing) from the radial site. °· The radial site is bleeding, and the bleeding does not stop after 30 minutes of holding steady pressure on the site. °· Your arm or hand becomes pale, cool, tingly, or numb. °This information is not intended to replace advice given to you by your health care provider. Make sure you discuss any questions you have with your health care provider. °Document Released: 11/10/2010 Document Revised: 03/15/2016 Document Reviewed: 04/26/2014 °Elsevier Interactive Patient Education © 2017 [employer] ° °

## 2017-01-25 NOTE — Progress Notes (Signed)
Client c/o feeling lightheaded and paged Dr Haroldine Laws

## 2017-01-25 NOTE — Telephone Encounter (Signed)
New message  Pt call requesting to speak with RN. Pt would like to discuss moving appt to a sooner date after the Holter monitor. Pt would like to discuss with RN. Please call back.

## 2017-01-25 NOTE — Progress Notes (Signed)
Dr Avon Gully with Dr Haroldine Laws in to see client and ok to discharge per Dr Avon Gully

## 2017-01-25 NOTE — Telephone Encounter (Signed)
Pt seen by Dr. Aundra Dubin yesterday, [place] today. She wanted to make sure we were aware of OV w/ Aundra Dubin and still continuing to have PVCs. She is having a holter placed on 4/12 and will return it on 4/16.   She and I will follow up after holter monitor results available to reviewed by physician.  We agreed to speak again around 4/20 or 4/23.

## 2017-01-28 ENCOUNTER — Telehealth (HOSPITAL_COMMUNITY): Payer: Self-pay | Admitting: *Deleted

## 2017-01-28 NOTE — Telephone Encounter (Signed)
Pre cert approval for r/l heart cath #[id number] valid through 04/28/17

## 2017-01-31 ENCOUNTER — Ambulatory Visit (INDEPENDENT_AMBULATORY_CARE_PROVIDER_SITE_OTHER): Payer: Medicare HMO

## 2017-01-31 DIAGNOSIS — I493 Ventricular premature depolarization: Secondary | ICD-10-CM | POA: Diagnosis not present

## 2017-02-12 ENCOUNTER — Telehealth: Payer: Self-pay | Admitting: Cardiology

## 2017-02-12 DIAGNOSIS — R0602 Shortness of breath: Secondary | ICD-10-CM | POA: Diagnosis not present

## 2017-02-12 DIAGNOSIS — I5022 Chronic systolic (congestive) heart failure: Secondary | ICD-10-CM | POA: Diagnosis not present

## 2017-02-12 NOTE — Telephone Encounter (Signed)
Informed pt that Dr. Curt Bears didn't order monitor, so results would not come to him to read.  Explained that I would, however, have Dr. Curt Bears review tomorrow and call her with his interpretation of results. She thanks me for helping with this.

## 2017-02-12 NOTE — Telephone Encounter (Signed)
New message     Calling to get heart monitor results

## 2017-02-14 ENCOUNTER — Telehealth (HOSPITAL_COMMUNITY): Payer: Self-pay

## 2017-02-14 NOTE — Telephone Encounter (Signed)
Patient calling CHF clinic to review holter monitor results. Reviewed interpretation per Dr. Haroldine Laws, no recommendations/changes/new orders at this time. Reminded of upcoming appointment with Dr. bensimhon's office and to call us or EP if worsening palpitations occur. Aware and agreeable.  Renee Pain, RN

## 2017-02-14 NOTE — Telephone Encounter (Signed)
Informed PVC burden greatly reduced. This is good news!! Patient verbalized understanding and thanks me for helping.

## 2017-02-25 ENCOUNTER — Other Ambulatory Visit (HOSPITAL_COMMUNITY): Payer: Self-pay | Admitting: Cardiology

## 2017-02-25 MED ORDER — CARVEDILOL 6.25 MG PO TABS: 9.3750 mg | ORAL_TABLET | Freq: Two times a day (BID) | ORAL | 3 refills | Status: DC

## 2017-03-06 ENCOUNTER — Ambulatory Visit (HOSPITAL_COMMUNITY)
Admission: RE | Admit: 2017-03-06 | Discharge: 2017-03-06 | Disposition: A | Discharge status: Home / Self Care | Payer: Medicare HMO | Source: Ambulatory Visit | Attending: Internal Medicine | Admitting: Internal Medicine

## 2017-03-06 ENCOUNTER — Encounter (HOSPITAL_COMMUNITY): Payer: Self-pay | Admitting: Internal Medicine

## 2017-03-06 VITALS — BP 148/84 | HR 58 | Ht 65.0 in | Wt 132.5 lb

## 2017-03-06 DIAGNOSIS — E875 Hyperkalemia: Secondary | ICD-10-CM | POA: Diagnosis not present

## 2017-03-06 DIAGNOSIS — I493 Ventricular premature depolarization: Secondary | ICD-10-CM | POA: Insufficient documentation

## 2017-03-06 DIAGNOSIS — Z8249 Family history of ischemic heart disease and other diseases of the circulatory system: Secondary | ICD-10-CM | POA: Insufficient documentation

## 2017-03-06 DIAGNOSIS — I4891 Unspecified atrial fibrillation: Secondary | ICD-10-CM | POA: Diagnosis not present

## 2017-03-06 DIAGNOSIS — E1122 Type 2 diabetes mellitus with diabetic chronic kidney disease: Secondary | ICD-10-CM | POA: Diagnosis not present

## 2017-03-06 DIAGNOSIS — I130 Hypertensive heart and chronic kidney disease with heart failure and stage 1 through stage 4 chronic kidney disease, or unspecified chronic kidney disease: Secondary | ICD-10-CM | POA: Diagnosis not present

## 2017-03-06 DIAGNOSIS — Z880 Allergy status to penicillin: Secondary | ICD-10-CM | POA: Diagnosis not present

## 2017-03-06 DIAGNOSIS — Z7982 Long term (current) use of aspirin: Secondary | ICD-10-CM | POA: Diagnosis not present

## 2017-03-06 DIAGNOSIS — Z809 Family history of malignant neoplasm, unspecified: Secondary | ICD-10-CM | POA: Insufficient documentation

## 2017-03-06 DIAGNOSIS — E785 Hyperlipidemia, unspecified: Secondary | ICD-10-CM | POA: Diagnosis not present

## 2017-03-06 DIAGNOSIS — Z887 Allergy status to serum and vaccine status: Secondary | ICD-10-CM | POA: Diagnosis not present

## 2017-03-06 DIAGNOSIS — I447 Left bundle-branch block, unspecified: Secondary | ICD-10-CM | POA: Insufficient documentation

## 2017-03-06 DIAGNOSIS — Z833 Family history of diabetes mellitus: Secondary | ICD-10-CM | POA: Diagnosis not present

## 2017-03-06 DIAGNOSIS — Z9889 Other specified postprocedural states: Secondary | ICD-10-CM | POA: Diagnosis not present

## 2017-03-06 DIAGNOSIS — I38 Endocarditis, valve unspecified: Secondary | ICD-10-CM | POA: Insufficient documentation

## 2017-03-06 DIAGNOSIS — J9611 Chronic respiratory failure with hypoxia: Secondary | ICD-10-CM | POA: Insufficient documentation

## 2017-03-06 DIAGNOSIS — Z885 Allergy status to narcotic agent status: Secondary | ICD-10-CM | POA: Insufficient documentation

## 2017-03-06 DIAGNOSIS — G4730 Sleep apnea, unspecified: Secondary | ICD-10-CM | POA: Diagnosis not present

## 2017-03-06 DIAGNOSIS — I5022 Chronic systolic (congestive) heart failure: Secondary | ICD-10-CM | POA: Diagnosis not present

## 2017-03-06 DIAGNOSIS — I5042 Chronic combined systolic (congestive) and diastolic (congestive) heart failure: Secondary | ICD-10-CM | POA: Insufficient documentation

## 2017-03-06 DIAGNOSIS — Z888 Allergy status to other drugs, medicaments and biological substances status: Secondary | ICD-10-CM | POA: Diagnosis not present

## 2017-03-06 DIAGNOSIS — Z886 Allergy status to analgesic agent status: Secondary | ICD-10-CM | POA: Diagnosis not present

## 2017-03-06 DIAGNOSIS — Z9109 Other allergy status, other than to drugs and biological substances: Secondary | ICD-10-CM | POA: Insufficient documentation

## 2017-03-06 DIAGNOSIS — Z823 Family history of stroke: Secondary | ICD-10-CM | POA: Diagnosis not present

## 2017-03-06 DIAGNOSIS — N183 Chronic kidney disease, stage 3 (moderate): Secondary | ICD-10-CM | POA: Diagnosis not present

## 2017-03-06 DIAGNOSIS — Z87891 Personal history of nicotine dependence: Secondary | ICD-10-CM | POA: Insufficient documentation

## 2017-03-06 NOTE — Progress Notes (Signed)
Advanced Heart Failure Clinic Note   Referring Physician: Dr. Acie Fredrickson Primary Care: Leighton Ruff, MD Primary Cardiologist: Dr. Acie Fredrickson EP: Dr. Ileana Ladd HF: New (Dr. Haroldine Laws)  HPI:  Ralphine Hinks Alemu is a 81 y.o. female with hx of HTN, PVCs, HL, DM2, CKD, histoplasmosis, and tobacco abuse.   Diagnosed with CHF 10/19/17 with symptoms of DOE and orthopnea. CXR with cardiomegaly at that time and started on po lasix.   Echo 10/24/16 LVEF 35-40%, Grade 2 DD, Moderate LAE, mild AI, Moderate MR.  NST low risk with no evidence of ischemia or infarction, but frequent PVCs.  48 hr Holter monitor placed 10/23/16 with 29% multifocal PVCs. Felt to be etiology of cardiomyopathy.   Tried amio initially for a few days the decided to go with ablation, S/p PVC ablation by Dr. Curt Bears 11/30/16  4/18 Had repeat 48-holter monitor. Frequent PACs and PVC with periods of wandering atrial pacemaker and several brief runs SVT.  PVC burden down to 2.1%   Underwent cath on 01/25/17. Feels fine at rest. Says she is a whole a lot better since January and February but has not gotten back to baseline. At rest feels fine but with activity gets fatigued. Now walking to mailbox 3x/day. Gets SOB and fatigued. Still with palpitations - feels they are increasing. No orthopnea or PND. Having some dizziness. [place] and O2 sats dropped into lows 80s.   Underwent R/L HC on January 25, 2017  Findings:  RA = 2 RV = 24/3 PA = 28/14 (21) PCW = 10 Ao = 151/65 (81) LV = 146/16 Fick cardiac output/index = 5.4/3.3 PVR = 2.0 WU SVR 1161 FA sat = PA sat =  Assessment: 1. Heavily calcified coronaries with non-obstructive CAD. RCA 50%mid, LAD 30% 2. EF 45-50%  3, Normal hemodynamics 4. Significantly decreased PVC burden in cath lab from previous   PFTs 01/17/17  FEV1 1.57L FVC   2.31L  FEV1/FVC 68% -> 74% with bronchodilators DLCO 55%  CT [facility] chest 9/17: COPD. Granulomatous disease. +coronary  Ca2+  Echo 25-30%. RV ok  bigmeiny   Past Medical History:  Diagnosis Date  . Atrial fibrillation ([place])   . Chronic combined systolic and diastolic CHF (congestive heart failure) ([place])    a. LV dysfcuntion felt to be related to PVCs  . Chronic kidney disease   . Diabetes mellitus without complication ([place])   . Hx of echocardiogram    Echo (1/16):  EF 60-65%, no RWMA, Gr 1 DD, mod AI, mod MR, mild LAE  . Hyperkalemia   . Hyperlipidemia   . Hypertension   . Orthostatic hypotension   . PVC (premature ventricular contraction)    a. s/p PVC ablation in 11/2016  . Rheumatic fever   . Sleep apnea     Current Outpatient Prescriptions  Medication Sig Dispense Refill  . acetaminophen (TYLENOL) 325 MG tablet Take 325 mg by mouth every 6 (six) hours as needed for mild pain. For pain     . aspirin 81 MG tablet Take 1 tablet (81 mg total) by mouth daily. 30 tablet 11  . carvedilol (COREG) 6.25 MG tablet Take 1.5 tablets (9.375 mg total) by mouth 2 (two) times daily with a meal. 270 tablet 3  . cholecalciferol (VITAMIN D) 1000 UNITS tablet Take 1,000 Units by mouth daily.     [name] denosumab (PROLIA) 60 MG/ML SOLN injection Inject 60 mg into the skin every 6 (six) months. Administer in upper arm, thigh, or abdomen    .  ferrous gluconate (FERGON) 324 MG tablet Take 1 tablet (324 mg total) by mouth daily with breakfast. 30 tablet 1  . furosemide (LASIX) 40 MG tablet TAKE 1 TABLET BY MOUTH DAILY 30 tablet 6  . losartan (COZAAR) 50 MG tablet Take 1 tablet (50 mg total) by mouth daily. 30 tablet 3  . Multiple Vitamin (MULTIVITAMIN) tablet Take 1 tablet by mouth daily.    [name] omeprazole (PRILOSEC) 40 MG capsule Take 40 mg by mouth 2 (two) times daily.    Vladimir Faster Glycol-Propyl Glycol (SYSTANE OP) Place 1 drop into both eyes 2 (two) times daily as needed (dryness).    [name] atorvastatin (LIPITOR) 20 MG tablet Take 1 tablet (20 mg total) by mouth daily. 30 tablet 4   No current facility-administered  medications for this encounter.     Allergies  Allergen Reactions  . Penicillins Itching and Rash    Amoxicillin ok- IM pen is what gives reaction  Has patient had a PCN reaction causing immediate rash, facial/tongue/throat swelling, SOB or lightheadedness with hypotension: no Has patient had a PCN reaction causing severe rash involving mucus membranes or skin necrosis: no Has patient had a PCN reaction that required hospitalization {no Has patient had a PCN reaction occurring within the last 10 years: {no If all of the above answers are "NO", then may proceed with Cephalosporin use.  [name] [Cefaclor] Itching  . Crestor [Rosuvastatin Calcium] Nausea Only  . Epinephrine Other (See Comments)    "feels like heart is beating out of chest"  . Nsaids     Pt states she is not taking because of kidney function  . Other Other (See Comments)    FLU SHOT-swelling, redness, fever  . Percocet [Oxycodone-Acetaminophen] Nausea And Vomiting  . Prednisone     Dizziness, spiked blood sugar      Social History   Social History  . Marital status: Widowed    Spouse name: N/A  . Number of children: N/A  . Years of education: N/A   Occupational History  . Not on file.   Social History Main Topics  . Smoking status: Former Smoker    Packs/day: 0.75    Years: 60.00    Quit date: 11/04/2016  . Smokeless tobacco: Never Used  . Alcohol use No  . Drug use: No  . Sexual activity: Not on file   Other Topics Concern  . Not on file   Social History Narrative  . No narrative on file      Family History  Problem Relation Age of Onset  . Heart attack Father   . Heart disease Father   . Cancer Father   . Hypertension Father   . Hypertension Brother   . Stroke Brother   . Hypertension Brother   . Diabetes Son   . Hyperlipidemia Son   . Hypertension Son     Vitals:   03/06/17 1042  BP: (!) 148/84  Pulse: (!) 58  SpO2: 97%  Weight: 132 lb 8 oz (60.1 kg)  Height: 5\' 5"  (1.651 m)       PHYSICAL EXAM: General:  Elderly No resp difficulty HEENT: normal Neck: supple. no JVD. Carotids 2+ bilat; no bruits. No lymphadenopathy or thryomegaly appreciated. Cor: PMI nondisplaced. Irregular. No murmur. Lungs: clear with decreased  bs throughout  No wheezing.  Abdomen: soft, nontender, nondistended. No hepatosplenomegaly. No bruits or masses. Good bowel sounds. Extremities: no cyanosis, clubbing, rash, edema Neuro: alert & orientedx3, cranial nerves grossly intact. moves all  4 extremities w/o difficulty. Affect pleasant   ASSESSMENT & PLAN:  1. Chronic combined CHF - LVEF 35-40% echo 10/2016 with Grade 2 DD - EF improved on cath. Now in 45-50% range suspect PVC related CM.  - Results of cath reviewed. Hemodynamics optimized - Suspect majority of symptoms related to lung disease and deconditioning.  - Will refer to Pulmonary Rehab - Continue lasix 40 mg daily with extra as needed. - Continue current HF meds - Repeat echo in 2 months.  2. PVCs - 48 Holter with 29% focal PVCs. Failed amio after only brief trial - s/p PVC ablation c/b LBBB - Still with periods of ectopy. Mildly symptomatic. Follow-up Holter with 2.1% PVCs. I discussed with her and her daughter and explained that this was likely below the threshold to cause cardiomyopathy and reassured them and if her symptoms were tolerable I wouldn't do much else. However if her symptoms become intolerable we will need to consider increasing carvedilol or adding CCB or mexilitene. She feels they are tolerable currently.  - ECG today with NSR 78. LBBB. No PVCs - She has f/u with Dr. Curt Bears in June  3. Valvular disease - Stable on echo.   Mild AI and Moderate MR.  - Will repeat in 2 months  4. CKD III - Stable  5. Chronic hypoxic respiratory failure.  - I reviewed her CT images and PFTs with her and her daughter personally. Theses suggest that she as at least mild COPD with minimal response to bronchodilators and severe  diffusion defect.She desats into 69s with exertion. Will continue home O2. Refer to Pulmonary Rehab. Needs f/u with Pulmoanry to optimize.    Glori Bickers, MD 12/10/16

## 2017-03-06 NOTE — Patient Instructions (Addendum)
Keep appointment with Dr Curt Bears for June  You have been referred to Pulmonary Rehab, they will call you to schedule  Your physician recommends that you schedule a follow-up appointment in: 3 months with echocardiogram

## 2017-03-08 DIAGNOSIS — K2210 Ulcer of esophagus without bleeding: Secondary | ICD-10-CM | POA: Diagnosis not present

## 2017-03-08 DIAGNOSIS — J449 Chronic obstructive pulmonary disease, unspecified: Secondary | ICD-10-CM | POA: Diagnosis not present

## 2017-03-08 DIAGNOSIS — R0989 Other specified symptoms and signs involving the circulatory and respiratory systems: Secondary | ICD-10-CM | POA: Diagnosis not present

## 2017-03-08 DIAGNOSIS — R1310 Dysphagia, unspecified: Secondary | ICD-10-CM | POA: Diagnosis not present

## 2017-03-08 DIAGNOSIS — D500 Iron deficiency anemia secondary to blood loss (chronic): Secondary | ICD-10-CM | POA: Diagnosis not present

## 2017-03-08 DIAGNOSIS — R195 Other fecal abnormalities: Secondary | ICD-10-CM | POA: Diagnosis not present

## 2017-03-14 DIAGNOSIS — R0602 Shortness of breath: Secondary | ICD-10-CM | POA: Diagnosis not present

## 2017-03-14 DIAGNOSIS — I5022 Chronic systolic (congestive) heart failure: Secondary | ICD-10-CM | POA: Diagnosis not present

## 2017-03-20 ENCOUNTER — Encounter: Payer: Self-pay | Admitting: Cardiology

## 2017-04-09 ENCOUNTER — Ambulatory Visit (INDEPENDENT_AMBULATORY_CARE_PROVIDER_SITE_OTHER): Payer: Medicare HMO | Admitting: Cardiology

## 2017-04-09 ENCOUNTER — Encounter: Payer: Self-pay | Admitting: Cardiology

## 2017-04-09 VITALS — BP 144/82 | HR 70 | Ht 65.0 in | Wt 137.8 lb

## 2017-04-09 DIAGNOSIS — I428 Other cardiomyopathies: Secondary | ICD-10-CM | POA: Diagnosis not present

## 2017-04-09 DIAGNOSIS — R531 Weakness: Secondary | ICD-10-CM | POA: Diagnosis not present

## 2017-04-09 DIAGNOSIS — E782 Mixed hyperlipidemia: Secondary | ICD-10-CM

## 2017-04-09 DIAGNOSIS — I493 Ventricular premature depolarization: Secondary | ICD-10-CM | POA: Diagnosis not present

## 2017-04-09 DIAGNOSIS — R262 Difficulty in walking, not elsewhere classified: Secondary | ICD-10-CM | POA: Diagnosis not present

## 2017-04-09 DIAGNOSIS — M6281 Muscle weakness (generalized): Secondary | ICD-10-CM | POA: Diagnosis not present

## 2017-04-09 DIAGNOSIS — M545 Low back pain: Secondary | ICD-10-CM | POA: Diagnosis not present

## 2017-04-09 LAB — CK: CK TOTAL: 71 U/L (ref 24–173)

## 2017-04-09 MED ORDER — CARVEDILOL 6.25 MG PO TABS: 6.2500 mg | ORAL_TABLET | Freq: Two times a day (BID) | ORAL | 2 refills | Status: DC

## 2017-04-09 NOTE — Patient Instructions (Addendum)
Medication Instructions:   Your physician has recommended you make the following change in your medication:  1) DECREASE Carvedilol to 6.25 mg twice daily  - If you need a refill on your cardiac medications before your next appointment, please call your pharmacy.   Labwork:  CK today  Testing/Procedures:  None ordered  Follow-Up:  Your physician wants you to follow-up in: 6 months with Dr. Curt Bears in [facility].  You will receive a reminder letter in the mail two months in advance. If you don't receive a letter, please call our office to schedule the follow-up appointment.  Thank you for choosing [facility]!!   Trinidad Curet, RN [phone]

## 2017-04-09 NOTE — Progress Notes (Signed)
Electrophysiology Office Note   Date:  04/09/2017   ID:  Krista Mason, [name] 1936/07/06, MRN [id number]  PCP:  Leighton Ruff, MD  Cardiologist:  N[place] Primary Electrophysiologist:  Karalina Tift [name], MD    Chief Complaint  Patient presents with  . Follow-up    PVC's     History of Present Illness: Krista Mason is a 81 y.o. female who presents today for electrophysiology evaluation.   Hx of HTN, PVCs, HLD, DM2, CKD, histoplasmosis, and tobacco abuse. Recently diagnosed with CHF. Myoview without ischemia. Holter with 29% PVCs. She has been having episodes of fatigue and shortness of breath.  That being said, her SOB has improved since starting her lasix. She is sleeping better but does still complain of palpitations. She previously had to sleep in a chair but was able to sleep on one pillow recently. Had PVC ablation 11/26/16 with ablation inferior the RCC. Ablation was complicated by LBBB.   Today, denies symptoms of palpitations, chest pain, shortness of breath, orthopnea, PND, lower extremity edema, claudication, presyncope, syncope, bleeding, or neurologic sequela. The patient is tolerating medications without difficulties.She does say that she has dizziness when she stands. She also has weakness in her legs. She is recently moved to an assisted living facility and has her own apartment there. She is planning to start work with physical therapy this evening. Her dizziness and difficulty walking occur mainly with changing positions. She says that she does drink fluids with meals, but does not drink aside from that.   Past Medical History:  Diagnosis Date  . Atrial fibrillation ([place])   . Chronic combined systolic and diastolic CHF (congestive heart failure) ([place])    a. LV dysfcuntion felt to be related to PVCs  . Chronic kidney disease   . Diabetes mellitus without complication ([place])   . Hx of echocardiogram    Echo (1/16):  EF 60-65%, no RWMA, Gr 1 DD, mod AI, mod MR, mild LAE   . Hyperkalemia   . Hyperlipidemia   . Hypertension   . Orthostatic hypotension   . PVC (premature ventricular contraction)    a. s/p PVC ablation in 11/2016  . Rheumatic fever   . Sleep apnea    Past Surgical History:  Procedure Laterality Date  . KNEE SURGERY Left 2009  . OVARIAN CYST REMOVAL    . PVC ABLATION N/A 11/30/2016   Procedure: PVC Ablation;  Surgeon: Sabrina Arriaga [name], MD;  Location: [facility];  Service: Cardiovascular;  Laterality: N/A;  . RIGHT/LEFT HEART CATH AND CORONARY ANGIOGRAPHY N/A 01/25/2017   Procedure: Right/Left Heart Cath and Coronary Angiography;  Surgeon: Jolaine Artist, MD;  Location: [facility];  Service: Cardiovascular;  Laterality: N/A;     Current Outpatient Prescriptions  Medication Sig Dispense Refill  . acetaminophen (TYLENOL) 325 MG tablet Take 325 mg by mouth every 6 (six) hours as needed for mild pain. For pain     . aspirin 81 MG tablet Take 1 tablet (81 mg total) by mouth daily. 30 tablet 11  . atorvastatin (LIPITOR) 20 MG tablet Take 1 tablet (20 mg total) by mouth daily. 30 tablet 4  . cholecalciferol (VITAMIN D) 1000 UNITS tablet Take 1,000 Units by mouth daily.     [name] denosumab (PROLIA) 60 MG/ML SOLN injection Inject 60 mg into the skin every 6 (six) months. Administer in upper arm, thigh, or abdomen    . ferrous gluconate (FERGON) 324 MG tablet Take 324 mg by  mouth every other day.    . furosemide (LASIX) 40 MG tablet TAKE 1 TABLET BY MOUTH DAILY 30 tablet 6  . losartan (COZAAR) 50 MG tablet Take 1 tablet (50 mg total) by mouth daily. 30 tablet 3  . Multiple Vitamin (MULTIVITAMIN) tablet Take 1 tablet by mouth daily.    [name] omeprazole (PRILOSEC) 40 MG capsule Take 40 mg by mouth daily.     Vladimir Faster Glycol-Propyl Glycol (SYSTANE OP) Place 1 drop into both eyes 2 (two) times daily as needed (dryness).    . carvedilol (COREG) 6.25 MG tablet Take 1 tablet (6.25 mg total) by mouth 2 (two) times daily. 180 tablet 2   No  current facility-administered medications for this visit.     Allergies:   Penicillins; Ceclor [cefaclor]; Crestor [rosuvastatin calcium]; Epinephrine; Nsaids; Other; Percocet [oxycodone-acetaminophen]; and Prednisone   Social History:  The patient  reports that she quit smoking about 5 months ago. She has a 45.00 pack-year smoking history. She has never used smokeless tobacco. She reports that she does not drink alcohol or use drugs.   Family History:  The patient's family history includes Cancer in her father; Diabetes in her son; Heart attack in her father; Heart disease in her father; Hyperlipidemia in her son; Hypertension in her brother, brother, father, and son; Stroke in her brother.    ROS:  Please see the history of present illness.   Otherwise, review of systems is positive for weight gain, visual changes, cough, dyspnea on exertion, back pain, muscle pain, balance problems, dizziness, headaches, easy bruising.   All other systems are reviewed and negative.     PHYSICAL EXAM: VS:  BP (!) 144/82   Pulse 70   Ht 5\' 5"  (1.651 m)   Wt 137 lb 12.8 oz (62.5 kg)   BMI 22.93 kg/m  , BMI Body mass index is 22.93 kg/m. GEN: Well nourished, well developed, in no acute distress  HEENT: normal  Neck: no JVD, carotid bruits, or masses Cardiac: RRR; no murmurs, rubs, or gallops,no edema  Respiratory:  clear to auscultation bilaterally, normal work of breathing GI: soft, nontender, nondistended, + BS MS: no deformity or atrophy  Skin: warm and dry Neuro:  Strength and sensation are intact Psych: euthymic mood, full affect  EKG:  EKG is not ordered today. Personal review of the ekg ordered 03/06/17 shows sinus rhythm, LBBB   Recent Labs: 10/23/2016: NT-Pro BNP 4,005 11/05/2016: TSH 2.077 11/08/2016: Magnesium 2.1 11/09/2016: B Natriuretic Peptide 212.2 01/23/2017: BUN 23; Creatinine, Ser 1.50; Hemoglobin 12.2; Platelets 206; Potassium 4.5; Sodium 138    Lipid Panel  No results found  for: CHOL, TRIG, HDL, CHOLHDL, VLDL, LDLCALC, LDLDIRECT   Wt Readings from Last 3 Encounters:  04/09/17 137 lb 12.8 oz (62.5 kg)  03/06/17 132 lb 8 oz (60.1 kg)  01/25/17 128 lb (58.1 kg)      Other studies Reviewed: Additional studies/ records that were reviewed today include: TTE 10/24/16, SPECT 10/24/16, Holter 10/30/16  Review of the above records today demonstrates:  - Left ventricle: Systolic function was moderately reduced. The   estimated ejection fraction was in the range of 35% to 40%.   Diffuse hypokinesis. Features are consistent with a pseudonormal   left ventricular filling pattern, with concomitant abnormal   relaxation and increased filling pressure (grade 2 diastolic   dysfunction). - Aortic valve: There was mild regurgitation. - Mitral valve: There was moderate regurgitation directed   centrally. Valve area by pressure half-time: 2.47 cm^2. -  Left atrium: The atrium was moderately dilated. - Pericardium, extracardiac: A small pericardial effusion was   identified circumferential to the heart. There was no evidence of   hemodynamic compromise.   There was no ST segment deviation noted during stress.  This is a low risk study.   1. No evidence for ischemia or infarction.  2. Ungated study.  3. Frequent PVCs.    NSR  Frequent PVCs  Her PVC burden is 29%   Holter 01/31/17 - Personally reviewed Frequent PACs and PVC with periods of wandering atrial pacemaker and several brief runs SVT.   PVC burden down to 2.1%  ASSESSMENT AND PLAN:  1.  PVCs: Had PVC ablation 11/26/16. PVCs were inferior to the right coronary cusp. Repeat Holter monitor showed PVC burden of 2.1%. Plan to continue current management.  2. Nonischemic cardiomyopathy: Possibly due to elevated PVC burden. Is on optimal medical therapy. She is having some episodes of dizziness. We'll decrease her carvedilol to 6.25 mg.  3. Hyperlipidemia: Continue Lipitor. She is having lower extremity  weakness. We Josselyn Harkins plan for a CK to determine if she is having complications from her statin.    Current medicines are reviewed at length with the patient today.   The patient does not have concerns regarding her medicines.  The following changes were made today:    Labs/ tests ordered today include:  Orders Placed This Encounter  Procedures  . CK (Creatine Kinase)     Disposition:   FU with Terena Bohan 6 months  Signed, Airyn Ellzey [name], MD  04/09/2017 10:14 AM     [facility] [address] [name] [address] [phone] (office) [phone] (fax)

## 2017-04-10 DIAGNOSIS — M545 Low back pain: Secondary | ICD-10-CM | POA: Diagnosis not present

## 2017-04-10 DIAGNOSIS — M6281 Muscle weakness (generalized): Secondary | ICD-10-CM | POA: Diagnosis not present

## 2017-04-10 DIAGNOSIS — R262 Difficulty in walking, not elsewhere classified: Secondary | ICD-10-CM | POA: Diagnosis not present

## 2017-04-12 DIAGNOSIS — M6281 Muscle weakness (generalized): Secondary | ICD-10-CM | POA: Diagnosis not present

## 2017-04-12 DIAGNOSIS — M545 Low back pain: Secondary | ICD-10-CM | POA: Diagnosis not present

## 2017-04-12 DIAGNOSIS — R262 Difficulty in walking, not elsewhere classified: Secondary | ICD-10-CM | POA: Diagnosis not present

## 2017-04-14 DIAGNOSIS — I5022 Chronic systolic (congestive) heart failure: Secondary | ICD-10-CM | POA: Diagnosis not present

## 2017-04-14 DIAGNOSIS — R0602 Shortness of breath: Secondary | ICD-10-CM | POA: Diagnosis not present

## 2017-04-15 DIAGNOSIS — M545 Low back pain: Secondary | ICD-10-CM | POA: Diagnosis not present

## 2017-04-15 DIAGNOSIS — R262 Difficulty in walking, not elsewhere classified: Secondary | ICD-10-CM | POA: Diagnosis not present

## 2017-04-15 DIAGNOSIS — M6281 Muscle weakness (generalized): Secondary | ICD-10-CM | POA: Diagnosis not present

## 2017-04-16 DIAGNOSIS — R262 Difficulty in walking, not elsewhere classified: Secondary | ICD-10-CM | POA: Diagnosis not present

## 2017-04-16 DIAGNOSIS — M545 Low back pain: Secondary | ICD-10-CM | POA: Diagnosis not present

## 2017-04-16 DIAGNOSIS — M6281 Muscle weakness (generalized): Secondary | ICD-10-CM | POA: Diagnosis not present

## 2017-04-17 DIAGNOSIS — M6281 Muscle weakness (generalized): Secondary | ICD-10-CM | POA: Diagnosis not present

## 2017-04-17 DIAGNOSIS — M545 Low back pain: Secondary | ICD-10-CM | POA: Diagnosis not present

## 2017-04-17 DIAGNOSIS — R262 Difficulty in walking, not elsewhere classified: Secondary | ICD-10-CM | POA: Diagnosis not present

## 2017-04-18 DIAGNOSIS — M6281 Muscle weakness (generalized): Secondary | ICD-10-CM | POA: Diagnosis not present

## 2017-04-18 DIAGNOSIS — M545 Low back pain: Secondary | ICD-10-CM | POA: Diagnosis not present

## 2017-04-18 DIAGNOSIS — R262 Difficulty in walking, not elsewhere classified: Secondary | ICD-10-CM | POA: Diagnosis not present

## 2017-04-22 DIAGNOSIS — M6281 Muscle weakness (generalized): Secondary | ICD-10-CM | POA: Diagnosis not present

## 2017-04-23 DIAGNOSIS — M6281 Muscle weakness (generalized): Secondary | ICD-10-CM | POA: Diagnosis not present

## 2017-04-25 DIAGNOSIS — M6281 Muscle weakness (generalized): Secondary | ICD-10-CM | POA: Diagnosis not present

## 2017-04-26 DIAGNOSIS — D500 Iron deficiency anemia secondary to blood loss (chronic): Secondary | ICD-10-CM | POA: Diagnosis not present

## 2017-04-26 DIAGNOSIS — M6281 Muscle weakness (generalized): Secondary | ICD-10-CM | POA: Diagnosis not present

## 2017-04-26 DIAGNOSIS — R2689 Other abnormalities of gait and mobility: Secondary | ICD-10-CM | POA: Diagnosis not present

## 2017-04-29 DIAGNOSIS — M6281 Muscle weakness (generalized): Secondary | ICD-10-CM | POA: Diagnosis not present

## 2017-04-30 DIAGNOSIS — M6281 Muscle weakness (generalized): Secondary | ICD-10-CM | POA: Diagnosis not present

## 2017-05-02 DIAGNOSIS — M6281 Muscle weakness (generalized): Secondary | ICD-10-CM | POA: Diagnosis not present

## 2017-05-03 DIAGNOSIS — M6281 Muscle weakness (generalized): Secondary | ICD-10-CM | POA: Diagnosis not present

## 2017-05-07 ENCOUNTER — Telehealth: Payer: Self-pay | Admitting: *Deleted

## 2017-05-07 DIAGNOSIS — E559 Vitamin D deficiency, unspecified: Secondary | ICD-10-CM | POA: Diagnosis not present

## 2017-05-07 DIAGNOSIS — I447 Left bundle-branch block, unspecified: Secondary | ICD-10-CM | POA: Diagnosis not present

## 2017-05-07 DIAGNOSIS — E119 Type 2 diabetes mellitus without complications: Secondary | ICD-10-CM | POA: Diagnosis not present

## 2017-05-07 DIAGNOSIS — N189 Chronic kidney disease, unspecified: Secondary | ICD-10-CM | POA: Diagnosis not present

## 2017-05-07 DIAGNOSIS — R002 Palpitations: Secondary | ICD-10-CM | POA: Diagnosis not present

## 2017-05-07 DIAGNOSIS — I493 Ventricular premature depolarization: Secondary | ICD-10-CM | POA: Diagnosis not present

## 2017-05-07 DIAGNOSIS — E785 Hyperlipidemia, unspecified: Secondary | ICD-10-CM | POA: Diagnosis not present

## 2017-05-07 DIAGNOSIS — D509 Iron deficiency anemia, unspecified: Secondary | ICD-10-CM | POA: Diagnosis not present

## 2017-05-07 DIAGNOSIS — R809 Proteinuria, unspecified: Secondary | ICD-10-CM | POA: Diagnosis not present

## 2017-05-07 DIAGNOSIS — I10 Essential (primary) hypertension: Secondary | ICD-10-CM | POA: Diagnosis not present

## 2017-05-07 NOTE — Telephone Encounter (Signed)
Dr Lovena Le received phone call from Dr Drema Dallas with [facility] regarding patient.  An EKG was faxed over for review.  After discussion with Dr Lovena Le and reviewing EKG, Dr. Lovena Le let Dr Drema Dallas know we would have patient seen in 1-2 weeks with Dr Curt Bears.  Message forwarded to Lorenda Hatchet to schedule.

## 2017-05-08 DIAGNOSIS — M6281 Muscle weakness (generalized): Secondary | ICD-10-CM | POA: Diagnosis not present

## 2017-05-09 ENCOUNTER — Ambulatory Visit (INDEPENDENT_AMBULATORY_CARE_PROVIDER_SITE_OTHER): Payer: Medicare HMO | Admitting: Cardiology

## 2017-05-09 ENCOUNTER — Encounter: Payer: Self-pay | Admitting: Cardiology

## 2017-05-09 VITALS — BP 138/90 | HR 94 | Ht 65.0 in | Wt 139.2 lb

## 2017-05-09 DIAGNOSIS — E782 Mixed hyperlipidemia: Secondary | ICD-10-CM

## 2017-05-09 DIAGNOSIS — I493 Ventricular premature depolarization: Secondary | ICD-10-CM | POA: Diagnosis not present

## 2017-05-09 DIAGNOSIS — I428 Other cardiomyopathies: Secondary | ICD-10-CM

## 2017-05-09 NOTE — Progress Notes (Signed)
Electrophysiology Office Note   Date:  05/09/2017   ID:  Krista Mason July 11, 1936, MRN [id number]  PCP:  Krista Ruff, MD  Cardiologist:  Krista Mason Primary Electrophysiologist:  Krista Spina [name], MD    Chief Complaint  Patient presents with  . Follow-up    PVC's     History of Present Illness: Krista Mason is a 81 y.o. female who presents today for electrophysiology evaluation.   Hx of HTN, PVCs, HLD, DM2, CKD, histoplasmosis, and tobacco abuse. Recently diagnosed with CHF. Myoview without ischemia. Holter with 29% PVCs. She has been having episodes of fatigue and shortness of breath.  That being said, her SOB has improved since starting her lasix. She is sleeping better but does still complain of palpitations. She previously had to sleep in a chair but was able to sleep on one pillow recently. Had PVC ablation 11/26/16 with ablation inferior the RCC. Ablation was complicated by LBBB.   Today, denies symptoms of palpitations, chest pain, orthopnea, PND, lower extremity edema, claudication, dizziness, presyncope, syncope, bleeding, or neurologic sequela. The patient is tolerating medications without difficulties and is otherwise without complaint today. She is feeling shortness of breath with exertion today. She says that she was having PVCs more often over the weekend. Her daughter feels that she overexerted herself on Saturday, and her PVCs began on Sunday. They lasted until Monday. She went to her primary physician's office, complaining of shortness of breath. An EKG was done that showed PVCs.   Past Medical History:  Diagnosis Date  . Atrial fibrillation ([place])   . Chronic combined systolic and diastolic CHF (congestive heart failure) ([place])    a. LV dysfcuntion felt to be related to PVCs  . Chronic kidney disease   . Diabetes mellitus without complication ([place])   . Hx of echocardiogram    Echo (1/16):  EF 60-65%, no RWMA, Gr 1 DD, mod AI, mod MR, mild LAE  . Hyperkalemia    . Hyperlipidemia   . Hypertension   . Orthostatic hypotension   . PVC (premature ventricular contraction)    a. s/p PVC ablation in 11/2016  . Rheumatic fever   . Sleep apnea    Past Surgical History:  Procedure Laterality Date  . KNEE SURGERY Left 2009  . OVARIAN CYST REMOVAL    . PVC ABLATION N/A 11/30/2016   Procedure: PVC Ablation;  Surgeon: Krista Carignan [name], MD;  Location: [facility];  Service: Cardiovascular;  Laterality: N/A;  . RIGHT/LEFT HEART CATH AND CORONARY ANGIOGRAPHY N/A 01/25/2017   Procedure: Right/Left Heart Cath and Coronary Angiography;  Surgeon: Krista Artist, MD;  Location: [facility];  Service: Cardiovascular;  Laterality: N/A;     Current Outpatient Prescriptions  Medication Sig Dispense Refill  . acetaminophen (TYLENOL) 325 MG tablet Take 325 mg by mouth every 6 (six) hours as needed for mild pain. For pain     . aspirin 81 MG tablet Take 1 tablet (81 mg total) by mouth daily. 30 tablet 11  . atorvastatin (LIPITOR) 20 MG tablet Take 1 tablet (20 mg total) by mouth daily. 30 tablet 4  . carvedilol (COREG) 6.25 MG tablet Take 1 tablet (6.25 mg total) by mouth 2 (two) times daily. 180 tablet 2  . cholecalciferol (VITAMIN D) 1000 UNITS tablet Take 1,000 Units by mouth daily.     [name] denosumab (PROLIA) 60 MG/ML SOLN injection Inject 60 mg into the skin every 6 (six) months. Administer in upper arm,  thigh, or abdomen    . ferrous gluconate (FERGON) 324 MG tablet Take 324 mg by mouth every other day.    . furosemide (LASIX) 40 MG tablet TAKE 1 TABLET BY MOUTH DAILY 30 tablet 6  . losartan (COZAAR) 50 MG tablet Take 1 tablet (50 mg total) by mouth daily. 30 tablet 3  . Multiple Vitamin (MULTIVITAMIN) tablet Take 1 tablet by mouth daily.    [name] omeprazole (PRILOSEC) 40 MG capsule Take 40 mg by mouth daily.     Krista Mason Glycol-Propyl Glycol (SYSTANE OP) Place 1 drop into both eyes 2 (two) times daily as needed (dryness).     No current  facility-administered medications for this visit.     Allergies:   Penicillins; Ceclor [cefaclor]; Crestor [rosuvastatin calcium]; Epinephrine; Nsaids; Other; Percocet [oxycodone-acetaminophen]; and Prednisone   Social History:  The patient  reports that she quit smoking about 6 months ago. She has a 45.00 pack-year smoking history. She has never used smokeless tobacco. She reports that she does not drink alcohol or use drugs.   Family History:  The patient's family history includes Cancer in her father; Diabetes in her son; Heart attack in her father; Heart disease in her father; Hyperlipidemia in her son; Hypertension in her brother, brother, father, and son; Stroke in her brother.    ROS:  Please see the history of present illness.   Otherwise, review of systems is positive for SOB, palpitations, back pain, balance problems.   All other systems are reviewed and negative.   PHYSICAL EXAM: VS:  BP 138/90   Pulse 94   Ht 5\' 5"  (1.651 m)   Wt 139 lb 3.2 oz (63.1 kg)   SpO2 99%   BMI 23.16 kg/m  , BMI Body mass index is 23.16 kg/m. GEN: Well nourished, well developed, in no acute distress  HEENT: normal  Neck: no JVD, carotid bruits, or masses Cardiac: RRR; no murmurs, rubs, or gallops,no edema  Respiratory:  clear to auscultation bilaterally, normal work of breathing GI: soft, nontender, nondistended, + BS MS: no deformity or atrophy  Skin: warm and dry Neuro:  Strength and sensation are intact Psych: euthymic mood, full affect  EKG:  EKG is not ordered today. Personal review of the ekg ordered 02/24/17 shows sinus rhythm, left bundle branch block  Recent Labs: 10/23/2016: NT-Pro BNP 4,005 11/05/2016: TSH 2.077 11/08/2016: Magnesium 2.1 11/09/2016: B Natriuretic Peptide 212.2 01/23/2017: BUN 23; Creatinine, Ser 1.50; Hemoglobin 12.2; Platelets 206; Potassium 4.5; Sodium 138    Lipid Panel  No results found for: CHOL, TRIG, HDL, CHOLHDL, VLDL, LDLCALC, LDLDIRECT   Wt Readings from  Last 3 Encounters:  05/09/17 139 lb 3.2 oz (63.1 kg)  04/09/17 137 lb 12.8 oz (62.5 kg)  03/06/17 132 lb 8 oz (60.1 kg)      Other studies Reviewed: Additional studies/ records that were reviewed today include: TTE 10/24/16, SPECT 10/24/16, Holter 10/30/16  Review of the above records today demonstrates:  - Left ventricle: Systolic function was moderately reduced. The   estimated ejection fraction was in the range of 35% to 40%.   Diffuse hypokinesis. Features are consistent with a pseudonormal   left ventricular filling pattern, with concomitant abnormal   relaxation and increased filling pressure (grade 2 diastolic   dysfunction). - Aortic valve: There was mild regurgitation. - Mitral valve: There was moderate regurgitation directed   centrally. Valve area by pressure half-time: 2.47 cm^2. - Left atrium: The atrium was moderately dilated. - Pericardium, extracardiac: A  small pericardial effusion was   identified circumferential to the heart. There was no evidence of   hemodynamic compromise.   There was no ST segment deviation noted during stress.  This is a low risk study.   1. No evidence for ischemia or infarction.  2. Ungated study.  3. Frequent PVCs.    NSR  Frequent PVCs  Her PVC burden is 29%   Holter 01/31/17 - Personally reviewed Frequent PACs and PVC with periods of wandering atrial pacemaker and several brief runs SVT.   PVC burden down to 2.1%  Cath 01/25/17 1. Heavily calcified coronaries with non-obstructive CAD 2. EF 45-50%  3, Normal hemodynamics 4. Significantly decreased PVC burden in cath lab from previous  ASSESSMENT AND PLAN:  1.  PVCs: I PVC ablation 11/26/16. Repeat Holter monitor shows a decrease in her PVC burden down to 2.1% since ablation. She had EKGs done at her primary physician's that did show PVCs. Auscultation today does not reveal any PVC activity. It is likely that she Turner Kunzman have salvos of PVCs at times, but with her low burden, no  further workup needs to occur at this time.  2. Nonischemic cardiomyopathy: Possibly due to elevated PVC burden. Her ejection fraction has improved based on catheterization to 45-50%. No changes at this time.  3. Hyperlipidemia: Continue Lipitor    Current medicines are reviewed at length with the patient today.   The patient does not have concerns regarding her medicines.  The following changes were made today:    Labs/ tests ordered today include:  No orders of the defined types were placed in this encounter.    Disposition:   FU with Finlee Concepcion 6 months  Signed, Ronnette Rump [name], MD  05/09/2017 12:03 PM     [facility] [address] [place] [address] [phone] (office) [phone] (fax)

## 2017-05-09 NOTE — Patient Instructions (Signed)
Medication Instructions:    Your physician recommends that you continue on your current medications as directed. Please refer to the Current Medication list given to you today.  - If you need a refill on your cardiac medications before your next appointment, please call your pharmacy.   Labwork:  None ordered  Testing/Procedures:  None ordered  Follow-Up:  Your physician wants you to follow-up in:6 MONTHS with Dr. Curt Bears.  You will receive a reminder letter in the mail two months in advance. If you don't receive a letter, please call our office to schedule the follow-up appointment.  Thank you for choosing [facility]!!   Trinidad Curet, RN [phone]

## 2017-05-10 DIAGNOSIS — M6281 Muscle weakness (generalized): Secondary | ICD-10-CM | POA: Diagnosis not present

## 2017-05-14 DIAGNOSIS — M6281 Muscle weakness (generalized): Secondary | ICD-10-CM | POA: Diagnosis not present

## 2017-05-14 DIAGNOSIS — R0602 Shortness of breath: Secondary | ICD-10-CM | POA: Diagnosis not present

## 2017-05-14 DIAGNOSIS — I5022 Chronic systolic (congestive) heart failure: Secondary | ICD-10-CM | POA: Diagnosis not present

## 2017-05-15 DIAGNOSIS — M6281 Muscle weakness (generalized): Secondary | ICD-10-CM | POA: Diagnosis not present

## 2017-05-17 ENCOUNTER — Other Ambulatory Visit: Payer: Self-pay | Admitting: Cardiology

## 2017-05-21 DIAGNOSIS — M6281 Muscle weakness (generalized): Secondary | ICD-10-CM | POA: Diagnosis not present

## 2017-05-23 DIAGNOSIS — M6281 Muscle weakness (generalized): Secondary | ICD-10-CM | POA: Diagnosis not present

## 2017-05-23 DIAGNOSIS — M545 Low back pain: Secondary | ICD-10-CM | POA: Diagnosis not present

## 2017-05-23 DIAGNOSIS — R262 Difficulty in walking, not elsewhere classified: Secondary | ICD-10-CM | POA: Diagnosis not present

## 2017-05-24 DIAGNOSIS — M545 Low back pain: Secondary | ICD-10-CM | POA: Diagnosis not present

## 2017-05-24 DIAGNOSIS — M6281 Muscle weakness (generalized): Secondary | ICD-10-CM | POA: Diagnosis not present

## 2017-05-24 DIAGNOSIS — R262 Difficulty in walking, not elsewhere classified: Secondary | ICD-10-CM | POA: Diagnosis not present

## 2017-05-27 DIAGNOSIS — M6281 Muscle weakness (generalized): Secondary | ICD-10-CM | POA: Diagnosis not present

## 2017-05-27 DIAGNOSIS — R262 Difficulty in walking, not elsewhere classified: Secondary | ICD-10-CM | POA: Diagnosis not present

## 2017-05-27 DIAGNOSIS — M545 Low back pain: Secondary | ICD-10-CM | POA: Diagnosis not present

## 2017-05-28 DIAGNOSIS — M6281 Muscle weakness (generalized): Secondary | ICD-10-CM | POA: Diagnosis not present

## 2017-05-28 DIAGNOSIS — R262 Difficulty in walking, not elsewhere classified: Secondary | ICD-10-CM | POA: Diagnosis not present

## 2017-05-28 DIAGNOSIS — M545 Low back pain: Secondary | ICD-10-CM | POA: Diagnosis not present

## 2017-05-29 ENCOUNTER — Telehealth (HOSPITAL_COMMUNITY): Payer: Self-pay | Admitting: *Deleted

## 2017-05-29 NOTE — Telephone Encounter (Signed)
Patient called triage line to verify her echo and follow up appointment this Friday.  I called her back and she is aware of appointment times and gate code, no further questions.

## 2017-05-30 DIAGNOSIS — M6281 Muscle weakness (generalized): Secondary | ICD-10-CM | POA: Diagnosis not present

## 2017-05-30 DIAGNOSIS — R262 Difficulty in walking, not elsewhere classified: Secondary | ICD-10-CM | POA: Diagnosis not present

## 2017-05-30 DIAGNOSIS — M545 Low back pain: Secondary | ICD-10-CM | POA: Diagnosis not present

## 2017-05-31 ENCOUNTER — Ambulatory Visit (HOSPITAL_COMMUNITY)
Admission: RE | Admit: 2017-05-31 | Discharge: 2017-05-31 | Disposition: A | Discharge status: Home / Self Care | Payer: Medicare HMO | Source: Ambulatory Visit | Attending: Internal Medicine | Admitting: Internal Medicine

## 2017-05-31 ENCOUNTER — Encounter (HOSPITAL_COMMUNITY): Payer: Self-pay | Admitting: Internal Medicine

## 2017-05-31 ENCOUNTER — Ambulatory Visit (HOSPITAL_BASED_OUTPATIENT_CLINIC_OR_DEPARTMENT_OTHER)
Admission: RE | Admit: 2017-05-31 | Discharge: 2017-05-31 | Disposition: A | Discharge status: Home / Self Care | Payer: Medicare HMO | Source: Ambulatory Visit | Attending: Internal Medicine | Admitting: Internal Medicine

## 2017-05-31 ENCOUNTER — Other Ambulatory Visit (HOSPITAL_COMMUNITY): Payer: Self-pay | Admitting: Internal Medicine

## 2017-05-31 VITALS — BP 172/80 | HR 70 | Wt 141.0 lb

## 2017-05-31 DIAGNOSIS — J9611 Chronic respiratory failure with hypoxia: Secondary | ICD-10-CM

## 2017-05-31 DIAGNOSIS — Z79899 Other long term (current) drug therapy: Secondary | ICD-10-CM | POA: Diagnosis not present

## 2017-05-31 DIAGNOSIS — Z888 Allergy status to other drugs, medicaments and biological substances status: Secondary | ICD-10-CM | POA: Insufficient documentation

## 2017-05-31 DIAGNOSIS — E785 Hyperlipidemia, unspecified: Secondary | ICD-10-CM | POA: Insufficient documentation

## 2017-05-31 DIAGNOSIS — E1122 Type 2 diabetes mellitus with diabetic chronic kidney disease: Secondary | ICD-10-CM | POA: Insufficient documentation

## 2017-05-31 DIAGNOSIS — I428 Other cardiomyopathies: Secondary | ICD-10-CM | POA: Diagnosis not present

## 2017-05-31 DIAGNOSIS — Z833 Family history of diabetes mellitus: Secondary | ICD-10-CM | POA: Insufficient documentation

## 2017-05-31 DIAGNOSIS — R262 Difficulty in walking, not elsewhere classified: Secondary | ICD-10-CM | POA: Diagnosis not present

## 2017-05-31 DIAGNOSIS — I5042 Chronic combined systolic (congestive) and diastolic (congestive) heart failure: Secondary | ICD-10-CM | POA: Insufficient documentation

## 2017-05-31 DIAGNOSIS — I447 Left bundle-branch block, unspecified: Secondary | ICD-10-CM | POA: Insufficient documentation

## 2017-05-31 DIAGNOSIS — I5022 Chronic systolic (congestive) heart failure: Secondary | ICD-10-CM | POA: Diagnosis not present

## 2017-05-31 DIAGNOSIS — G4730 Sleep apnea, unspecified: Secondary | ICD-10-CM | POA: Diagnosis not present

## 2017-05-31 DIAGNOSIS — M545 Low back pain: Secondary | ICD-10-CM | POA: Diagnosis not present

## 2017-05-31 DIAGNOSIS — Z880 Allergy status to penicillin: Secondary | ICD-10-CM | POA: Insufficient documentation

## 2017-05-31 DIAGNOSIS — Z885 Allergy status to narcotic agent status: Secondary | ICD-10-CM | POA: Diagnosis not present

## 2017-05-31 DIAGNOSIS — M6281 Muscle weakness (generalized): Secondary | ICD-10-CM | POA: Diagnosis not present

## 2017-05-31 DIAGNOSIS — I493 Ventricular premature depolarization: Secondary | ICD-10-CM | POA: Diagnosis not present

## 2017-05-31 DIAGNOSIS — Z87891 Personal history of nicotine dependence: Secondary | ICD-10-CM | POA: Diagnosis not present

## 2017-05-31 DIAGNOSIS — Z7982 Long term (current) use of aspirin: Secondary | ICD-10-CM | POA: Diagnosis not present

## 2017-05-31 DIAGNOSIS — Z8249 Family history of ischemic heart disease and other diseases of the circulatory system: Secondary | ICD-10-CM | POA: Diagnosis not present

## 2017-05-31 DIAGNOSIS — N183 Chronic kidney disease, stage 3 (moderate): Secondary | ICD-10-CM | POA: Insufficient documentation

## 2017-05-31 DIAGNOSIS — I130 Hypertensive heart and chronic kidney disease with heart failure and stage 1 through stage 4 chronic kidney disease, or unspecified chronic kidney disease: Secondary | ICD-10-CM | POA: Diagnosis not present

## 2017-05-31 DIAGNOSIS — I4891 Unspecified atrial fibrillation: Secondary | ICD-10-CM | POA: Insufficient documentation

## 2017-05-31 DIAGNOSIS — I509 Heart failure, unspecified: Secondary | ICD-10-CM | POA: Diagnosis present

## 2017-05-31 NOTE — Patient Instructions (Signed)
Your physician recommends that you schedule a follow-up appointment in: 4 months  

## 2017-05-31 NOTE — Progress Notes (Signed)
Advanced Heart Failure Clinic Note   Referring Physician: Dr. Acie Fredrickson Primary Care: Leighton Ruff, MD Primary Cardiologist: Dr. Acie Fredrickson EP: Dr. Ileana Ladd HF: New (Dr. Haroldine Laws)  HPI:  Krista Mason is a 81 y.o. female with hx of HTN, PVCs, HL, DM2, CKD, histoplasmosis, and tobacco abuse.   Diagnosed with CHF 10/19/17 with symptoms of DOE and orthopnea. CXR with cardiomegaly at that time and started on po lasix.   Echo 10/24/16 LVEF 35-40%, Grade 2 DD, Moderate LAE, mild AI, Moderate MR.  NST low risk with no evidence of ischemia or infarction, but frequent PVCs.  48 hr Holter monitor placed 10/23/16 with 29% multifocal PVCs. Felt to be etiology of cardiomyopathy.   Tried amio initially for a few days the decided to go with ablation, S/p PVC ablation by Dr. Curt Bears 06/29/10 complicated by LBBB.   4/18 Had repeat 48-holter monitor. Frequent PACs and PVC with periods of wandering atrial pacemaker and several brief runs SVT. PVC burden down to 2.1%.   She presents today for HF follow up. No SOB with walking back to clinic, no SOB with walking throughout [place]. Taking all medications, weights at home 137-138 pounds. No longer wearing home oxygen as she says she does not need it. Taking all of her medications, denies orthopnea, PND and chest pain.   Underwent R/L HC on January 25, 2017  Findings:  RA = 2 RV = 24/3 PA = 28/14 (21) PCW = 10 Ao = 151/65 (81) LV = 146/16 Fick cardiac output/index = 5.4/3.3 PVR = 2.0 WU SVR 1161 FA sat = PA sat =  Assessment: 1. Heavily calcified coronaries with non-obstructive CAD. RCA 50%mid, LAD 30% 2. EF 45-50%  3, Normal hemodynamics 4. Significantly decreased PVC burden in cath lab from previous   PFTs 01/17/17  FEV1 1.57L FVC   2.31L  FEV1/FVC 68% -> 74% with bronchodilators DLCO 55%  CT san chest 9/17: COPD. Granulomatous disease. +coronary Ca2+  Echo 25-30%. RV ok  bigmeiny   Past Medical History:  Diagnosis Date   . Atrial fibrillation ([place])   . Chronic combined systolic and diastolic CHF (congestive heart failure) ([place])    a. LV dysfcuntion felt to be related to PVCs  . Chronic kidney disease   . Diabetes mellitus without complication ([place])   . Hx of echocardiogram    Echo (1/16):  EF 60-65%, no RWMA, Gr 1 DD, mod AI, mod MR, mild LAE  . Hyperkalemia   . Hyperlipidemia   . Hypertension   . Orthostatic hypotension   . PVC (premature ventricular contraction)    a. s/p PVC ablation in 11/2016  . Rheumatic fever   . Sleep apnea     Current Outpatient Prescriptions  Medication Sig Dispense Refill  . acetaminophen (TYLENOL) 325 MG tablet Take 325 mg by mouth every 6 (six) hours as needed for mild pain. For pain     . aspirin 81 MG tablet Take 1 tablet (81 mg total) by mouth daily. 30 tablet 11  . atorvastatin (LIPITOR) 20 MG tablet TAKE 1 TABLET BY MOUTH DAILY 30 tablet 11  . carvedilol (COREG) 6.25 MG tablet Take 1 tablet (6.25 mg total) by mouth 2 (two) times daily. 180 tablet 2  . cholecalciferol (VITAMIN D) 1000 UNITS tablet Take 1,000 Units by mouth daily.     [name] denosumab (PROLIA) 60 MG/ML SOLN injection Inject 60 mg into the skin every 6 (six) months. Administer in upper arm, thigh, or abdomen    .  ferrous gluconate (FERGON) 324 MG tablet Take 324 mg by mouth every other day.    . furosemide (LASIX) 40 MG tablet TAKE 1 TABLET BY MOUTH DAILY 30 tablet 6  . losartan (COZAAR) 50 MG tablet Take 1 tablet (50 mg total) by mouth daily. 30 tablet 3  . Multiple Vitamin (MULTIVITAMIN) tablet Take 1 tablet by mouth daily.    [name] omeprazole (PRILOSEC) 40 MG capsule Take 40 mg by mouth daily.     Vladimir Faster Glycol-Propyl Glycol (SYSTANE OP) Place 1 drop into both eyes 2 (two) times daily as needed (dryness).     No current facility-administered medications for this encounter.     Allergies  Allergen Reactions  . Penicillins Itching and Rash    Amoxicillin ok- IM pen is what gives reaction  Has  patient had a PCN reaction causing immediate rash, facial/tongue/throat swelling, SOB or lightheadedness with hypotension: no Has patient had a PCN reaction causing severe rash involving mucus membranes or skin necrosis: no Has patient had a PCN reaction that required hospitalization {no Has patient had a PCN reaction occurring within the last 10 years: {no If all of the above answers are "NO", then may proceed with Cephalosporin use.  [name] [Cefaclor] Itching  . Crestor [Rosuvastatin Calcium] Nausea Only  . Epinephrine Other (See Comments)    "feels like heart is beating out of chest"  . Nsaids     Pt states she is not taking because of kidney function  . Other Other (See Comments)    FLU SHOT-swelling, redness, fever  . Percocet [Oxycodone-Acetaminophen] Nausea And Vomiting  . Prednisone     Dizziness, spiked blood sugar      Social History   Social History  . Marital status: Widowed    Spouse name: N/A  . Number of children: N/A  . Years of education: N/A   Occupational History  . Not on file.   Social History Main Topics  . Smoking status: Former Smoker    Packs/day: 0.75    Years: 60.00    Quit date: 11/04/2016  . Smokeless tobacco: Never Used  . Alcohol use No  . Drug use: No  . Sexual activity: Not on file   Other Topics Concern  . Not on file   Social History Narrative  . No narrative on file      Family History  Problem Relation Age of Onset  . Heart attack Father   . Heart disease Father   . Cancer Father   . Hypertension Father   . Hypertension Brother   . Stroke Brother   . Hypertension Brother   . Diabetes Son   . Hyperlipidemia Son   . Hypertension Son     Vitals:   05/31/17 1053  BP: (!) 172/80  Pulse: 70  SpO2: 98%  Weight: 141 lb (64 kg)     PHYSICAL EXAM: General: Elderly female, NAD. Walked into clinic with walker.  HEENT: Normal.  Neck: supple. JVP flat.  Carotids 2+ bilat; no bruits. No lymphadenopathy or thryomegaly  appreciated. Cor: PMI nondisplaced. Regular rate and rhythm. No M/R/G Lungs: Diminished throughout.  Abdomen: Soft, non tender, non distended. No hepatosplenomegaly. No bruits or masses. Good bowel sounds. Extremities: no cyanosis, clubbing, rash. No peripheral edema. Chronic venous stasis  Neuro: Alert and oriented x 3. cranial nerves grossly intact. moves all 4 extremities w/o difficulty. Affect pleasant   ASSESSMENT & PLAN:  1. Chronic combined CHF: NICM s/p PVC ablation. Now with LBBB.  Echo today with EF 35-40% (unchanged from prior).  - NYHA II-III - Volume status stable on exam. Continue lasix 40 mg daily - Continue Coreg 6.25 mg BID.  - Continue losartan 50 mg daily. Considered switching her to [facility] however, she says that she has some dizziness at times when changing positions, she is at high risk for falls and would not want to make her orthostatic with Entresto.  - Consider adding Arlyce Harman next visit. K 4.6 (labs reviewed from [place]).   2. PVCs - s/p ablation. Off Amiodarone therapy. PVC burden 2.1%.   3. LBBB - At high risk for worsening cardiomyopathy, would consider CRT-D in the future. She is functionally doing well currently, but if functional class declines would consider. Recheck echo 6 months.   4. CKD III - Baseline creatinine 1.5-1.6.  - Creatinine 1.56 in July 2018   5. Chronic hypoxic respiratory failure.  - Encouraged her to continued to use her oxygen. O2 sats 88% with walking and talking and 90% with walking (without talking).   Arbutus Leas, NP 12/10/16  Patient seen and examined with Jettie Booze, NP. We discussed all aspects of the encounter. I agree with the assessment and plan as stated above.   Doing relatively well s/p PVC ablation however now has LBBB. EF may be a bit better without PVCs but now has LBBB with significant dyssynchrony so EF still down some. Will need to follow closely. BP is quite labile. She will keep BP log and if BP remains high will  switch to Entresto. I walked her int he halls personally today and sats dropped to 88% with walking and talking. Reinfirced need for supplemental O2.   Glori Bickers, MD  11:43 PM

## 2017-05-31 NOTE — Progress Notes (Signed)
  Echocardiogram 2D Echocardiogram has been performed.  Krista Mason 05/31/2017, 10:51 AM

## 2017-06-02 DIAGNOSIS — R262 Difficulty in walking, not elsewhere classified: Secondary | ICD-10-CM | POA: Diagnosis not present

## 2017-06-02 DIAGNOSIS — M6281 Muscle weakness (generalized): Secondary | ICD-10-CM | POA: Diagnosis not present

## 2017-06-02 DIAGNOSIS — M545 Low back pain: Secondary | ICD-10-CM | POA: Diagnosis not present

## 2017-06-03 ENCOUNTER — Telehealth: Payer: Self-pay | Admitting: *Deleted

## 2017-06-03 NOTE — Telephone Encounter (Signed)
Spoke with pt and advised due to current age of 47, pt is no longer eligible for the lung cancer screening per Medicare guidelines.  Pt verbalized understanding.  Nothing further needed.

## 2017-06-04 DIAGNOSIS — R262 Difficulty in walking, not elsewhere classified: Secondary | ICD-10-CM | POA: Diagnosis not present

## 2017-06-04 DIAGNOSIS — M545 Low back pain: Secondary | ICD-10-CM | POA: Diagnosis not present

## 2017-06-04 DIAGNOSIS — M6281 Muscle weakness (generalized): Secondary | ICD-10-CM | POA: Diagnosis not present

## 2017-06-06 DIAGNOSIS — M545 Low back pain: Secondary | ICD-10-CM | POA: Diagnosis not present

## 2017-06-06 DIAGNOSIS — R262 Difficulty in walking, not elsewhere classified: Secondary | ICD-10-CM | POA: Diagnosis not present

## 2017-06-06 DIAGNOSIS — M6281 Muscle weakness (generalized): Secondary | ICD-10-CM | POA: Diagnosis not present

## 2017-06-07 DIAGNOSIS — R262 Difficulty in walking, not elsewhere classified: Secondary | ICD-10-CM | POA: Diagnosis not present

## 2017-06-07 DIAGNOSIS — M6281 Muscle weakness (generalized): Secondary | ICD-10-CM | POA: Diagnosis not present

## 2017-06-07 DIAGNOSIS — M545 Low back pain: Secondary | ICD-10-CM | POA: Diagnosis not present

## 2017-06-09 DIAGNOSIS — M6281 Muscle weakness (generalized): Secondary | ICD-10-CM | POA: Diagnosis not present

## 2017-06-09 DIAGNOSIS — M545 Low back pain: Secondary | ICD-10-CM | POA: Diagnosis not present

## 2017-06-09 DIAGNOSIS — R262 Difficulty in walking, not elsewhere classified: Secondary | ICD-10-CM | POA: Diagnosis not present

## 2017-06-10 DIAGNOSIS — M545 Low back pain: Secondary | ICD-10-CM | POA: Diagnosis not present

## 2017-06-10 DIAGNOSIS — R262 Difficulty in walking, not elsewhere classified: Secondary | ICD-10-CM | POA: Diagnosis not present

## 2017-06-10 DIAGNOSIS — M6281 Muscle weakness (generalized): Secondary | ICD-10-CM | POA: Diagnosis not present

## 2017-06-11 DIAGNOSIS — M6281 Muscle weakness (generalized): Secondary | ICD-10-CM | POA: Diagnosis not present

## 2017-06-11 DIAGNOSIS — M545 Low back pain: Secondary | ICD-10-CM | POA: Diagnosis not present

## 2017-06-11 DIAGNOSIS — R262 Difficulty in walking, not elsewhere classified: Secondary | ICD-10-CM | POA: Diagnosis not present

## 2017-06-12 NOTE — Addendum Note (Signed)
Addendum  created 06/12/17 1148 by Albertha Ghee, MD   Sign clinical note

## 2017-06-13 DIAGNOSIS — M6281 Muscle weakness (generalized): Secondary | ICD-10-CM | POA: Diagnosis not present

## 2017-06-13 DIAGNOSIS — M545 Low back pain: Secondary | ICD-10-CM | POA: Diagnosis not present

## 2017-06-13 DIAGNOSIS — R262 Difficulty in walking, not elsewhere classified: Secondary | ICD-10-CM | POA: Diagnosis not present

## 2017-06-14 DIAGNOSIS — R0602 Shortness of breath: Secondary | ICD-10-CM | POA: Diagnosis not present

## 2017-06-14 DIAGNOSIS — I5022 Chronic systolic (congestive) heart failure: Secondary | ICD-10-CM | POA: Diagnosis not present

## 2017-06-16 DIAGNOSIS — M6281 Muscle weakness (generalized): Secondary | ICD-10-CM | POA: Diagnosis not present

## 2017-06-16 DIAGNOSIS — M545 Low back pain: Secondary | ICD-10-CM | POA: Diagnosis not present

## 2017-06-16 DIAGNOSIS — R262 Difficulty in walking, not elsewhere classified: Secondary | ICD-10-CM | POA: Diagnosis not present

## 2017-06-17 DIAGNOSIS — R262 Difficulty in walking, not elsewhere classified: Secondary | ICD-10-CM | POA: Diagnosis not present

## 2017-06-17 DIAGNOSIS — M6281 Muscle weakness (generalized): Secondary | ICD-10-CM | POA: Diagnosis not present

## 2017-06-17 DIAGNOSIS — M545 Low back pain: Secondary | ICD-10-CM | POA: Diagnosis not present

## 2017-06-30 DIAGNOSIS — M545 Low back pain: Secondary | ICD-10-CM | POA: Diagnosis not present

## 2017-06-30 DIAGNOSIS — R262 Difficulty in walking, not elsewhere classified: Secondary | ICD-10-CM | POA: Diagnosis not present

## 2017-06-30 DIAGNOSIS — M6281 Muscle weakness (generalized): Secondary | ICD-10-CM | POA: Diagnosis not present

## 2017-07-02 DIAGNOSIS — M6281 Muscle weakness (generalized): Secondary | ICD-10-CM | POA: Diagnosis not present

## 2017-07-02 DIAGNOSIS — M545 Low back pain: Secondary | ICD-10-CM | POA: Diagnosis not present

## 2017-07-02 DIAGNOSIS — R262 Difficulty in walking, not elsewhere classified: Secondary | ICD-10-CM | POA: Diagnosis not present

## 2017-07-03 DIAGNOSIS — M6281 Muscle weakness (generalized): Secondary | ICD-10-CM | POA: Diagnosis not present

## 2017-07-03 DIAGNOSIS — M545 Low back pain: Secondary | ICD-10-CM | POA: Diagnosis not present

## 2017-07-03 DIAGNOSIS — R262 Difficulty in walking, not elsewhere classified: Secondary | ICD-10-CM | POA: Diagnosis not present

## 2017-07-04 DIAGNOSIS — M545 Low back pain: Secondary | ICD-10-CM | POA: Diagnosis not present

## 2017-07-04 DIAGNOSIS — R262 Difficulty in walking, not elsewhere classified: Secondary | ICD-10-CM | POA: Diagnosis not present

## 2017-07-04 DIAGNOSIS — M6281 Muscle weakness (generalized): Secondary | ICD-10-CM | POA: Diagnosis not present

## 2017-07-09 DIAGNOSIS — M6281 Muscle weakness (generalized): Secondary | ICD-10-CM | POA: Diagnosis not present

## 2017-07-09 DIAGNOSIS — R262 Difficulty in walking, not elsewhere classified: Secondary | ICD-10-CM | POA: Diagnosis not present

## 2017-07-09 DIAGNOSIS — M545 Low back pain: Secondary | ICD-10-CM | POA: Diagnosis not present

## 2017-07-11 DIAGNOSIS — M545 Low back pain: Secondary | ICD-10-CM | POA: Diagnosis not present

## 2017-07-11 DIAGNOSIS — R262 Difficulty in walking, not elsewhere classified: Secondary | ICD-10-CM | POA: Diagnosis not present

## 2017-07-11 DIAGNOSIS — M6281 Muscle weakness (generalized): Secondary | ICD-10-CM | POA: Diagnosis not present

## 2017-07-15 ENCOUNTER — Other Ambulatory Visit (HOSPITAL_COMMUNITY): Payer: Self-pay | Admitting: Internal Medicine

## 2017-07-15 DIAGNOSIS — R0602 Shortness of breath: Secondary | ICD-10-CM | POA: Diagnosis not present

## 2017-07-15 DIAGNOSIS — I5022 Chronic systolic (congestive) heart failure: Secondary | ICD-10-CM | POA: Diagnosis not present

## 2017-07-16 DIAGNOSIS — M545 Low back pain: Secondary | ICD-10-CM | POA: Diagnosis not present

## 2017-07-16 DIAGNOSIS — M6281 Muscle weakness (generalized): Secondary | ICD-10-CM | POA: Diagnosis not present

## 2017-07-16 DIAGNOSIS — R262 Difficulty in walking, not elsewhere classified: Secondary | ICD-10-CM | POA: Diagnosis not present

## 2017-07-18 DIAGNOSIS — M6281 Muscle weakness (generalized): Secondary | ICD-10-CM | POA: Diagnosis not present

## 2017-07-18 DIAGNOSIS — M545 Low back pain: Secondary | ICD-10-CM | POA: Diagnosis not present

## 2017-07-18 DIAGNOSIS — R262 Difficulty in walking, not elsewhere classified: Secondary | ICD-10-CM | POA: Diagnosis not present

## 2017-07-23 DIAGNOSIS — M6281 Muscle weakness (generalized): Secondary | ICD-10-CM | POA: Diagnosis not present

## 2017-07-23 DIAGNOSIS — M545 Low back pain: Secondary | ICD-10-CM | POA: Diagnosis not present

## 2017-07-23 DIAGNOSIS — R262 Difficulty in walking, not elsewhere classified: Secondary | ICD-10-CM | POA: Diagnosis not present

## 2017-07-24 DIAGNOSIS — R262 Difficulty in walking, not elsewhere classified: Secondary | ICD-10-CM | POA: Diagnosis not present

## 2017-07-24 DIAGNOSIS — M545 Low back pain: Secondary | ICD-10-CM | POA: Diagnosis not present

## 2017-07-24 DIAGNOSIS — M6281 Muscle weakness (generalized): Secondary | ICD-10-CM | POA: Diagnosis not present

## 2017-07-25 DIAGNOSIS — M545 Low back pain: Secondary | ICD-10-CM | POA: Diagnosis not present

## 2017-07-25 DIAGNOSIS — M6281 Muscle weakness (generalized): Secondary | ICD-10-CM | POA: Diagnosis not present

## 2017-07-25 DIAGNOSIS — R262 Difficulty in walking, not elsewhere classified: Secondary | ICD-10-CM | POA: Diagnosis not present

## 2017-07-30 DIAGNOSIS — M6281 Muscle weakness (generalized): Secondary | ICD-10-CM | POA: Diagnosis not present

## 2017-07-30 DIAGNOSIS — R262 Difficulty in walking, not elsewhere classified: Secondary | ICD-10-CM | POA: Diagnosis not present

## 2017-07-30 DIAGNOSIS — M545 Low back pain: Secondary | ICD-10-CM | POA: Diagnosis not present

## 2017-08-01 DIAGNOSIS — M6281 Muscle weakness (generalized): Secondary | ICD-10-CM | POA: Diagnosis not present

## 2017-08-01 DIAGNOSIS — R262 Difficulty in walking, not elsewhere classified: Secondary | ICD-10-CM | POA: Diagnosis not present

## 2017-08-01 DIAGNOSIS — M545 Low back pain: Secondary | ICD-10-CM | POA: Diagnosis not present

## 2017-08-02 DIAGNOSIS — M6281 Muscle weakness (generalized): Secondary | ICD-10-CM | POA: Diagnosis not present

## 2017-08-02 DIAGNOSIS — R262 Difficulty in walking, not elsewhere classified: Secondary | ICD-10-CM | POA: Diagnosis not present

## 2017-08-02 DIAGNOSIS — M545 Low back pain: Secondary | ICD-10-CM | POA: Diagnosis not present

## 2017-08-14 DIAGNOSIS — I5022 Chronic systolic (congestive) heart failure: Secondary | ICD-10-CM | POA: Diagnosis not present

## 2017-08-14 DIAGNOSIS — R0602 Shortness of breath: Secondary | ICD-10-CM | POA: Diagnosis not present

## 2017-09-14 DIAGNOSIS — I5022 Chronic systolic (congestive) heart failure: Secondary | ICD-10-CM | POA: Diagnosis not present

## 2017-09-14 DIAGNOSIS — R0602 Shortness of breath: Secondary | ICD-10-CM | POA: Diagnosis not present

## 2017-09-17 DIAGNOSIS — M87180 Osteonecrosis due to drugs, jaw: Secondary | ICD-10-CM | POA: Diagnosis not present

## 2017-09-17 DIAGNOSIS — K136 Irritative hyperplasia of oral mucosa: Secondary | ICD-10-CM | POA: Diagnosis not present

## 2017-09-24 DIAGNOSIS — R809 Proteinuria, unspecified: Secondary | ICD-10-CM | POA: Diagnosis not present

## 2017-09-24 DIAGNOSIS — E119 Type 2 diabetes mellitus without complications: Secondary | ICD-10-CM | POA: Diagnosis not present

## 2017-10-02 ENCOUNTER — Encounter (HOSPITAL_COMMUNITY): Payer: Self-pay | Admitting: Internal Medicine

## 2017-10-02 ENCOUNTER — Other Ambulatory Visit (HOSPITAL_COMMUNITY): Payer: Self-pay | Admitting: Internal Medicine

## 2017-10-02 ENCOUNTER — Ambulatory Visit (HOSPITAL_COMMUNITY)
Admission: RE | Admit: 2017-10-02 | Discharge: 2017-10-02 | Disposition: A | Discharge status: Home / Self Care | Payer: Medicare HMO | Source: Ambulatory Visit | Attending: Internal Medicine | Admitting: Internal Medicine

## 2017-10-02 ENCOUNTER — Other Ambulatory Visit: Payer: Self-pay

## 2017-10-02 VITALS — BP 166/80 | HR 76 | Wt 148.8 lb

## 2017-10-02 DIAGNOSIS — Z809 Family history of malignant neoplasm, unspecified: Secondary | ICD-10-CM | POA: Diagnosis not present

## 2017-10-02 DIAGNOSIS — Z885 Allergy status to narcotic agent status: Secondary | ICD-10-CM | POA: Diagnosis not present

## 2017-10-02 DIAGNOSIS — Z823 Family history of stroke: Secondary | ICD-10-CM | POA: Diagnosis not present

## 2017-10-02 DIAGNOSIS — I493 Ventricular premature depolarization: Secondary | ICD-10-CM | POA: Insufficient documentation

## 2017-10-02 DIAGNOSIS — I5042 Chronic combined systolic (congestive) and diastolic (congestive) heart failure: Secondary | ICD-10-CM | POA: Diagnosis present

## 2017-10-02 DIAGNOSIS — N183 Chronic kidney disease, stage 3 (moderate): Secondary | ICD-10-CM | POA: Diagnosis not present

## 2017-10-02 DIAGNOSIS — Z880 Allergy status to penicillin: Secondary | ICD-10-CM | POA: Insufficient documentation

## 2017-10-02 DIAGNOSIS — B399 Histoplasmosis, unspecified: Secondary | ICD-10-CM | POA: Diagnosis not present

## 2017-10-02 DIAGNOSIS — Z833 Family history of diabetes mellitus: Secondary | ICD-10-CM | POA: Insufficient documentation

## 2017-10-02 DIAGNOSIS — I447 Left bundle-branch block, unspecified: Secondary | ICD-10-CM | POA: Insufficient documentation

## 2017-10-02 DIAGNOSIS — J9611 Chronic respiratory failure with hypoxia: Secondary | ICD-10-CM | POA: Diagnosis not present

## 2017-10-02 DIAGNOSIS — Z8249 Family history of ischemic heart disease and other diseases of the circulatory system: Secondary | ICD-10-CM | POA: Insufficient documentation

## 2017-10-02 DIAGNOSIS — Z888 Allergy status to other drugs, medicaments and biological substances status: Secondary | ICD-10-CM | POA: Diagnosis not present

## 2017-10-02 DIAGNOSIS — I130 Hypertensive heart and chronic kidney disease with heart failure and stage 1 through stage 4 chronic kidney disease, or unspecified chronic kidney disease: Secondary | ICD-10-CM | POA: Insufficient documentation

## 2017-10-02 DIAGNOSIS — E1122 Type 2 diabetes mellitus with diabetic chronic kidney disease: Secondary | ICD-10-CM | POA: Diagnosis not present

## 2017-10-02 DIAGNOSIS — Z79899 Other long term (current) drug therapy: Secondary | ICD-10-CM | POA: Insufficient documentation

## 2017-10-02 DIAGNOSIS — Z886 Allergy status to analgesic agent status: Secondary | ICD-10-CM | POA: Diagnosis not present

## 2017-10-02 DIAGNOSIS — Z7982 Long term (current) use of aspirin: Secondary | ICD-10-CM | POA: Diagnosis not present

## 2017-10-02 DIAGNOSIS — R55 Syncope and collapse: Secondary | ICD-10-CM | POA: Insufficient documentation

## 2017-10-02 DIAGNOSIS — Z87891 Personal history of nicotine dependence: Secondary | ICD-10-CM | POA: Diagnosis not present

## 2017-10-02 DIAGNOSIS — I5022 Chronic systolic (congestive) heart failure: Secondary | ICD-10-CM | POA: Diagnosis not present

## 2017-10-02 LAB — BASIC METABOLIC PANEL
Anion gap: 8 (ref 5–15)
BUN: 37 mg/dL — ABNORMAL HIGH (ref 6–20)
CO2: 27 mmol/L (ref 22–32)
Calcium: 10.1 mg/dL (ref 8.9–10.3)
Chloride: 101 mmol/L (ref 101–111)
Creatinine, Ser: 1.87 mg/dL — ABNORMAL HIGH (ref 0.44–1.00)
GFR calc Af Amer: 28 mL/min — ABNORMAL LOW (ref >60)
GFR calc non Af Amer: 24 mL/min — ABNORMAL LOW (ref >60)
Glucose, Bld: 143 mg/dL — ABNORMAL HIGH (ref 65–99)
Potassium: 4.5 mmol/L (ref 3.5–5.1)
Sodium: 136 mmol/L (ref 135–145)

## 2017-10-02 NOTE — Patient Instructions (Signed)
Labs today  Your physician has recommended that you wear an event monitor. Event monitors are medical devices that record the heart's electrical activity. Doctors most often [place] these monitors to diagnose arrhythmias. Arrhythmias are problems with the speed or rhythm of the heartbeat. The monitor is a small, portable device. You can wear one while you do your normal daily activities. This is usually used to diagnose what is causing palpitations/syncope (passing out).  We will contact you in 4 months to schedule your next appointment and echocardiogram

## 2017-10-02 NOTE — Progress Notes (Signed)
Advanced Heart Failure Clinic Note   Referring Physician: Dr. Acie Fredrickson Primary Care: Leighton Ruff, MD Primary Cardiologist: Dr. Acie Fredrickson EP: Dr. Ileana Ladd HF: New (Dr. Haroldine Laws)  HPI:  Krista Mason is a 81 y.o. female with hx of HTN, PVCs, HL, DM2, CKD, histoplasmosis, and tobacco abuse.   Diagnosed with CHF 10/19/17 with symptoms of DOE and orthopnea. CXR with cardiomegaly at that time and started on po lasix.   Echo 10/24/16 LVEF 35-40%, Grade 2 DD, Moderate LAE, mild AI, Moderate MR.  NST low risk with no evidence of ischemia or infarction, but frequent PVCs.  48 hr Holter monitor placed 10/23/16 with 29% multifocal PVCs. Felt to be etiology of cardiomyopathy.   Tried amio initially for a few days the decided to go with ablation, S/p PVC ablation by Dr. Curt Bears 05/25/68 complicated by LBBB.   4/18 Had repeat 48-holter monitor. Frequent PACs and PVC with periods of wandering atrial pacemaker and several brief runs SVT. PVC burden down to 2.1%.   She presents today for HF follow up. Overall feeling pretty good. Lives at [facility]. Chair exercise 45/day. And does Nu-step. SBP 115-140. Still very stiff. Continues to have occasional hear flips and flops. But no tachypalpitations. Uses a walker because occasionally she "loses where she is" and feels like she is going to pass out. No orthopnea or PND. Mild edema. Uses lasix 40 daily.   Recent labs with creatinine 1.6-> 1.8   Underwent R/L HC on January 25, 2017  Findings:  RA = 2 RV = 24/3 PA = 28/14 (21) PCW = 10 Ao = 151/65 (81) LV = 146/16 Fick cardiac output/index = 5.4/3.3 PVR = 2.0 WU SVR 1161 FA sat = PA sat =  Assessment: 1. Heavily calcified coronaries with non-obstructive CAD. RCA 50%mid, LAD 30% 2. EF 45-50%  3, Normal hemodynamics 4. Significantly decreased PVC burden in cath lab from previous   PFTs 01/17/17  FEV1 1.57L FVC   2.31L  FEV1/FVC 68% -> 74% with bronchodilators DLCO 55%  CT  san chest 9/17: COPD. Granulomatous disease. +coronary Ca2+  Echo 25-30%. RV ok  bigmeiny   Past Medical History:  Diagnosis Date  . Atrial fibrillation ([place])   . Chronic combined systolic and diastolic CHF (congestive heart failure) ([place])    a. LV dysfcuntion felt to be related to PVCs  . Chronic kidney disease   . Diabetes mellitus without complication ([place])   . Hx of echocardiogram    Echo (1/16):  EF 60-65%, no RWMA, Gr 1 DD, mod AI, mod MR, mild LAE  . Hyperkalemia   . Hyperlipidemia   . Hypertension   . Orthostatic hypotension   . PVC (premature ventricular contraction)    a. s/p PVC ablation in 11/2016  . Rheumatic fever   . Sleep apnea     Current Outpatient Medications  Medication Sig Dispense Refill  . acetaminophen (TYLENOL) 325 MG tablet Take 325 mg by mouth every 6 (six) hours as needed for mild pain. For pain     . aspirin 81 MG tablet Take 1 tablet (81 mg total) by mouth daily. 30 tablet 11  . atorvastatin (LIPITOR) 20 MG tablet TAKE 1 TABLET BY MOUTH DAILY 30 tablet 11  . carvedilol (COREG) 6.25 MG tablet Take 1 tablet (6.25 mg total) by mouth 2 (two) times daily. 180 tablet 2  . cholecalciferol (VITAMIN D) 1000 UNITS tablet Take 1,000 Units by mouth daily.     [name] denosumab (PROLIA) 60 MG/ML SOLN  injection Inject 60 mg into the skin every 6 (six) months. Administer in upper arm, thigh, or abdomen    . ferrous gluconate (FERGON) 324 MG tablet Take 324 mg by mouth every other day.    . furosemide (LASIX) 40 MG tablet TAKE 1 TABLET BY MOUTH DAILY 30 tablet 6  . losartan (COZAAR) 50 MG tablet Take 1 tablet (50 mg total) by mouth daily. 30 tablet 3  . Multiple Vitamin (MULTIVITAMIN) tablet Take 1 tablet by mouth daily.    [name] omeprazole (PRILOSEC) 40 MG capsule Take 40 mg by mouth daily.     Vladimir Faster Glycol-Propyl Glycol (SYSTANE OP) Place 1 drop into both eyes 2 (two) times daily as needed (dryness).     No current facility-administered medications for this  encounter.     Allergies  Allergen Reactions  . Penicillins Itching and Rash    Amoxicillin ok- IM pen is what gives reaction  Has patient had a PCN reaction causing immediate rash, facial/tongue/throat swelling, SOB or lightheadedness with hypotension: no Has patient had a PCN reaction causing severe rash involving mucus membranes or skin necrosis: no Has patient had a PCN reaction that required hospitalization {no Has patient had a PCN reaction occurring within the last 10 years: {no If all of the above answers are "NO", then may proceed with Cephalosporin use.  [name] [Cefaclor] Itching  . Crestor [Rosuvastatin Calcium] Nausea Only  . Epinephrine Other (See Comments)    "feels like heart is beating out of chest"  . Nsaids     Pt states she is not taking because of kidney function  . Other Other (See Comments)    FLU SHOT-swelling, redness, fever  . Percocet [Oxycodone-Acetaminophen] Nausea And Vomiting  . Prednisone     Dizziness, spiked blood sugar      Social History   Socioeconomic History  . Marital status: Widowed    Spouse name: Not on file  . Number of children: Not on file  . Years of education: Not on file  . Highest education level: Not on file  Social Needs  . Financial resource strain: Not on file  . Food insecurity - worry: Not on file  . Food insecurity - inability: Not on file  . Transportation needs - medical: Not on file  . Transportation needs - non-medical: Not on file  Occupational History  . Not on file  Tobacco Use  . Smoking status: Former Smoker    Packs/day: 0.75    Years: 60.00    Pack years: 45.00    Last attempt to quit: 11/04/2016    Years since quitting: 0.9  . Smokeless tobacco: Never Used  Substance and Sexual Activity  . Alcohol use: No  . Drug use: No  . Sexual activity: Not on file  Other Topics Concern  . Not on file  Social History Narrative  . Not on file      Family History  Problem Relation Age of Onset  .  Heart attack Father   . Heart disease Father   . Cancer Father   . Hypertension Father   . Hypertension Brother   . Stroke Brother   . Hypertension Brother   . Diabetes Son   . Hyperlipidemia Son   . Hypertension Son     Vitals:   10/02/17 0920  BP: (!) 166/80  Pulse: 76  SpO2: 100%  Weight: 148 lb 12 oz (67.5 kg)     PHYSICAL EXAM: General:  Elderly Well appearing.  No resp difficulty HEENT: normal Neck: supple. no JVD. Carotids 2+ bilat; no bruits. No lymphadenopathy or thryomegaly appreciated. Cor: PMI nondisplaced. Regular rate & rhythm. Occasional PVC. No rubs, gallops or murmurs. Lungs: clear. Decreased throughout Abdomen: soft, nontender, nondistended. No hepatosplenomegaly. No bruits or masses. Good bowel sounds. Extremities: no cyanosis, clubbing, rash, edema Neuro: alert & orientedx3, cranial nerves grossly intact. moves all 4 extremities w/o difficulty. Affect pleasant   ASSESSMENT & PLAN:  1. Chronic combined CHF: NICM s/p PVC ablation. Now with LBBB. Echo 8/18 EF 40% (unchanged from prior).  - Stable NYHA II- early III - Volume status stable on exam. Continue lasix 40 mg daily. Creatinine up recently. Will recheck. If keeps climbing can got back to 40 qod.  - Continue Coreg 6.25 mg BID.  - Continue losartan 50 mg daily. Considered switching her to [facility] however BP too low - Not candidate for spiro with K 4.6-4.8  2. PVCs - s/p ablation. Off Amiodarone therapy. PVC burden 2.1%.   3. LBBB - At high risk for worsening cardiomyopathy, would consider CRT-D in the future. She is functionally doing well currently, but if functional class declines would consider.   4. Drop attacks/syncope - Unclear if this is due to arrhythmia/pauses or other cause - Place 30-day monitor  5. CKD III - Baseline creatinine 1.5-1.6.  - Creatinine 1.8 on 12/6 - Will repeat if climbing cut lasix to 40 daily.   5. Chronic hypoxic respiratory failure.  - Encouraged her to  continued to use her oxygen. O2 sats 88% with walking and talking and 90% with walking (without talking).   Glori Bickers, MD 12/10/16

## 2017-10-04 ENCOUNTER — Telehealth (HOSPITAL_COMMUNITY): Payer: Self-pay | Admitting: *Deleted

## 2017-10-04 MED ORDER — FUROSEMIDE 40 MG PO TABS: 40.0000 mg | ORAL_TABLET | ORAL | 6 refills | Status: DC

## 2017-10-04 NOTE — Telephone Encounter (Signed)
-----   Message from Jolaine Artist, MD sent at 10/03/2017 12:34 AM EST ----- Creatinine climbing. Please change lasix to 40 mg every other day and as needed. Repeat bmet in 1 week

## 2017-10-09 ENCOUNTER — Other Ambulatory Visit (HOSPITAL_COMMUNITY): Payer: Self-pay | Admitting: *Deleted

## 2017-10-09 ENCOUNTER — Other Ambulatory Visit (HOSPITAL_COMMUNITY): Payer: Medicare HMO

## 2017-10-09 DIAGNOSIS — I5022 Chronic systolic (congestive) heart failure: Secondary | ICD-10-CM

## 2017-10-10 ENCOUNTER — Ambulatory Visit (HOSPITAL_COMMUNITY)
Admission: RE | Admit: 2017-10-10 | Discharge: 2017-10-10 | Disposition: A | Discharge status: Home / Self Care | Payer: Medicare HMO | Source: Ambulatory Visit | Attending: Cardiology | Admitting: Cardiology

## 2017-10-10 DIAGNOSIS — I5022 Chronic systolic (congestive) heart failure: Secondary | ICD-10-CM | POA: Diagnosis not present

## 2017-10-10 LAB — BASIC METABOLIC PANEL
ANION GAP: 7 (ref 5–15)
BUN: 36 mg/dL — ABNORMAL HIGH (ref 6–20)
CHLORIDE: 104 mmol/L (ref 101–111)
CO2: 24 mmol/L (ref 22–32)
CREATININE: 1.85 mg/dL — AB (ref 0.44–1.00)
Calcium: 9.6 mg/dL (ref 8.9–10.3)
GFR calc non Af Amer: 25 mL/min — ABNORMAL LOW (ref >60)
GFR, EST AFRICAN AMERICAN: 29 mL/min — AB (ref >60)
Glucose, Bld: 174 mg/dL — ABNORMAL HIGH (ref 65–99)
POTASSIUM: 4.7 mmol/L (ref 3.5–5.1)
Sodium: 135 mmol/L (ref 135–145)

## 2017-10-14 DIAGNOSIS — I5022 Chronic systolic (congestive) heart failure: Secondary | ICD-10-CM | POA: Diagnosis not present

## 2017-10-14 DIAGNOSIS — R0602 Shortness of breath: Secondary | ICD-10-CM | POA: Diagnosis not present

## 2017-10-29 ENCOUNTER — Ambulatory Visit (INDEPENDENT_AMBULATORY_CARE_PROVIDER_SITE_OTHER): Payer: Medicare HMO

## 2017-10-29 DIAGNOSIS — R55 Syncope and collapse: Secondary | ICD-10-CM | POA: Diagnosis not present

## 2017-11-14 DIAGNOSIS — R0602 Shortness of breath: Secondary | ICD-10-CM | POA: Diagnosis not present

## 2017-11-14 DIAGNOSIS — I5022 Chronic systolic (congestive) heart failure: Secondary | ICD-10-CM | POA: Diagnosis not present

## 2017-12-15 DIAGNOSIS — I5022 Chronic systolic (congestive) heart failure: Secondary | ICD-10-CM | POA: Diagnosis not present

## 2017-12-15 DIAGNOSIS — R0602 Shortness of breath: Secondary | ICD-10-CM | POA: Diagnosis not present

## 2017-12-16 ENCOUNTER — Telehealth (HOSPITAL_COMMUNITY): Payer: Self-pay | Admitting: *Deleted

## 2017-12-16 NOTE — Telephone Encounter (Signed)
-----   Message from Jolaine Artist, MD sent at 12/07/2017 11:46 PM EST ----- ok

## 2017-12-16 NOTE — Telephone Encounter (Signed)
Pt given results.  She also mentioned she has been having some swelling in her legs since Lasix was decreased to QOD.  We discussed salt/fluid intake, sounds like pt stays within her limits.  She exercises daily and tries to keep feet elevated when she is in her room.  Advised per chart, in Dec lasix was decreased to QOD and prn, pt states she did not realize prn, she will try this.  Advised if it does help and she continues to have swelling to please call back for sooner appt, she is agreeable

## 2017-12-17 DIAGNOSIS — R69 Illness, unspecified: Secondary | ICD-10-CM | POA: Diagnosis not present

## 2018-01-12 DIAGNOSIS — I5022 Chronic systolic (congestive) heart failure: Secondary | ICD-10-CM | POA: Diagnosis not present

## 2018-01-12 DIAGNOSIS — R0602 Shortness of breath: Secondary | ICD-10-CM | POA: Diagnosis not present

## 2018-01-14 DIAGNOSIS — R490 Dysphonia: Secondary | ICD-10-CM | POA: Diagnosis not present

## 2018-01-14 DIAGNOSIS — N189 Chronic kidney disease, unspecified: Secondary | ICD-10-CM | POA: Diagnosis not present

## 2018-01-14 DIAGNOSIS — L0291 Cutaneous abscess, unspecified: Secondary | ICD-10-CM | POA: Diagnosis not present

## 2018-01-14 DIAGNOSIS — Z8719 Personal history of other diseases of the digestive system: Secondary | ICD-10-CM | POA: Diagnosis not present

## 2018-01-14 DIAGNOSIS — E119 Type 2 diabetes mellitus without complications: Secondary | ICD-10-CM | POA: Diagnosis not present

## 2018-01-28 ENCOUNTER — Emergency Department (HOSPITAL_COMMUNITY): Payer: Medicare HMO

## 2018-01-28 ENCOUNTER — Other Ambulatory Visit: Payer: Self-pay

## 2018-01-28 ENCOUNTER — Encounter (HOSPITAL_COMMUNITY): Payer: Self-pay | Admitting: Emergency Medicine

## 2018-01-28 ENCOUNTER — Telehealth (HOSPITAL_COMMUNITY): Payer: Self-pay | Admitting: *Deleted

## 2018-01-28 ENCOUNTER — Observation Stay (HOSPITAL_COMMUNITY)
Admission: EM | Admit: 2018-01-28 | Discharge: 2018-01-31 | Disposition: A | Discharge status: Home / Self Care | Payer: Medicare HMO | Attending: Family Medicine | Admitting: Family Medicine

## 2018-01-28 DIAGNOSIS — I2584 Coronary atherosclerosis due to calcified coronary lesion: Secondary | ICD-10-CM | POA: Diagnosis not present

## 2018-01-28 DIAGNOSIS — I5043 Acute on chronic combined systolic (congestive) and diastolic (congestive) heart failure: Secondary | ICD-10-CM | POA: Insufficient documentation

## 2018-01-28 DIAGNOSIS — Z87891 Personal history of nicotine dependence: Secondary | ICD-10-CM | POA: Diagnosis not present

## 2018-01-28 DIAGNOSIS — J449 Chronic obstructive pulmonary disease, unspecified: Secondary | ICD-10-CM | POA: Diagnosis not present

## 2018-01-28 DIAGNOSIS — E1151 Type 2 diabetes mellitus with diabetic peripheral angiopathy without gangrene: Secondary | ICD-10-CM | POA: Diagnosis not present

## 2018-01-28 DIAGNOSIS — I5022 Chronic systolic (congestive) heart failure: Secondary | ICD-10-CM | POA: Diagnosis not present

## 2018-01-28 DIAGNOSIS — I340 Nonrheumatic mitral (valve) insufficiency: Secondary | ICD-10-CM | POA: Diagnosis not present

## 2018-01-28 DIAGNOSIS — J9601 Acute respiratory failure with hypoxia: Secondary | ICD-10-CM | POA: Insufficient documentation

## 2018-01-28 DIAGNOSIS — I5042 Chronic combined systolic (congestive) and diastolic (congestive) heart failure: Secondary | ICD-10-CM | POA: Diagnosis present

## 2018-01-28 DIAGNOSIS — I10 Essential (primary) hypertension: Secondary | ICD-10-CM

## 2018-01-28 DIAGNOSIS — Z885 Allergy status to narcotic agent status: Secondary | ICD-10-CM | POA: Insufficient documentation

## 2018-01-28 DIAGNOSIS — N183 Chronic kidney disease, stage 3 unspecified: Secondary | ICD-10-CM | POA: Diagnosis present

## 2018-01-28 DIAGNOSIS — E782 Mixed hyperlipidemia: Secondary | ICD-10-CM | POA: Diagnosis present

## 2018-01-28 DIAGNOSIS — Z880 Allergy status to penicillin: Secondary | ICD-10-CM | POA: Diagnosis not present

## 2018-01-28 DIAGNOSIS — E7849 Other hyperlipidemia: Secondary | ICD-10-CM | POA: Diagnosis not present

## 2018-01-28 DIAGNOSIS — R0609 Other forms of dyspnea: Secondary | ICD-10-CM | POA: Diagnosis not present

## 2018-01-28 DIAGNOSIS — I2510 Atherosclerotic heart disease of native coronary artery without angina pectoris: Secondary | ICD-10-CM | POA: Diagnosis not present

## 2018-01-28 DIAGNOSIS — R0902 Hypoxemia: Secondary | ICD-10-CM | POA: Diagnosis present

## 2018-01-28 DIAGNOSIS — L0291 Cutaneous abscess, unspecified: Secondary | ICD-10-CM | POA: Diagnosis not present

## 2018-01-28 DIAGNOSIS — I130 Hypertensive heart and chronic kidney disease with heart failure and stage 1 through stage 4 chronic kidney disease, or unspecified chronic kidney disease: Principal | ICD-10-CM | POA: Insufficient documentation

## 2018-01-28 DIAGNOSIS — N179 Acute kidney failure, unspecified: Secondary | ICD-10-CM | POA: Diagnosis present

## 2018-01-28 DIAGNOSIS — I051 Rheumatic mitral insufficiency: Secondary | ICD-10-CM | POA: Insufficient documentation

## 2018-01-28 DIAGNOSIS — E118 Type 2 diabetes mellitus with unspecified complications: Secondary | ICD-10-CM

## 2018-01-28 DIAGNOSIS — E1122 Type 2 diabetes mellitus with diabetic chronic kidney disease: Secondary | ICD-10-CM | POA: Diagnosis not present

## 2018-01-28 DIAGNOSIS — Z7982 Long term (current) use of aspirin: Secondary | ICD-10-CM | POA: Insufficient documentation

## 2018-01-28 DIAGNOSIS — R0789 Other chest pain: Secondary | ICD-10-CM | POA: Diagnosis not present

## 2018-01-28 DIAGNOSIS — E119 Type 2 diabetes mellitus without complications: Secondary | ICD-10-CM | POA: Diagnosis not present

## 2018-01-28 DIAGNOSIS — I447 Left bundle-branch block, unspecified: Secondary | ICD-10-CM | POA: Insufficient documentation

## 2018-01-28 DIAGNOSIS — N184 Chronic kidney disease, stage 4 (severe): Secondary | ICD-10-CM | POA: Diagnosis present

## 2018-01-28 DIAGNOSIS — E785 Hyperlipidemia, unspecified: Secondary | ICD-10-CM | POA: Diagnosis not present

## 2018-01-28 DIAGNOSIS — Z8249 Family history of ischemic heart disease and other diseases of the circulatory system: Secondary | ICD-10-CM | POA: Diagnosis not present

## 2018-01-28 DIAGNOSIS — E1169 Type 2 diabetes mellitus with other specified complication: Secondary | ICD-10-CM | POA: Diagnosis present

## 2018-01-28 DIAGNOSIS — L0231 Cutaneous abscess of buttock: Secondary | ICD-10-CM | POA: Insufficient documentation

## 2018-01-28 DIAGNOSIS — J9611 Chronic respiratory failure with hypoxia: Secondary | ICD-10-CM | POA: Insufficient documentation

## 2018-01-28 DIAGNOSIS — R079 Chest pain, unspecified: Secondary | ICD-10-CM | POA: Diagnosis not present

## 2018-01-28 DIAGNOSIS — J439 Emphysema, unspecified: Secondary | ICD-10-CM | POA: Diagnosis not present

## 2018-01-28 DIAGNOSIS — R0602 Shortness of breath: Secondary | ICD-10-CM

## 2018-01-28 LAB — COMPREHENSIVE METABOLIC PANEL
ALBUMIN: 3.7 g/dL (ref 3.5–5.0)
ALK PHOS: 104 U/L (ref 38–126)
ALT: 9 U/L — AB (ref 14–54)
AST: 17 U/L (ref 15–41)
Anion gap: 9 (ref 5–15)
BUN: 33 mg/dL — AB (ref 6–20)
CALCIUM: 9.9 mg/dL (ref 8.9–10.3)
CO2: 23 mmol/L (ref 22–32)
Chloride: 102 mmol/L (ref 101–111)
Creatinine, Ser: 1.82 mg/dL — ABNORMAL HIGH (ref 0.44–1.00)
GFR calc Af Amer: 29 mL/min — ABNORMAL LOW (ref >60)
GFR calc non Af Amer: 25 mL/min — ABNORMAL LOW (ref >60)
GLUCOSE: 111 mg/dL — AB (ref 65–99)
Potassium: 4.2 mmol/L (ref 3.5–5.1)
Sodium: 134 mmol/L — ABNORMAL LOW (ref 135–145)
Total Bilirubin: 0.6 mg/dL (ref 0.3–1.2)
Total Protein: 6.6 g/dL (ref 6.5–8.1)

## 2018-01-28 LAB — CBC WITH DIFFERENTIAL/PLATELET
HEMATOCRIT: 35.4 % — AB (ref 36.0–46.0)
Hemoglobin: 11.5 g/dL — ABNORMAL LOW (ref 12.0–15.0)
MCH: 28.6 pg (ref 26.0–34.0)
MCHC: 32.5 g/dL (ref 30.0–36.0)
MCV: 88.1 fL (ref 78.0–100.0)
Platelets: 236 10*3/uL (ref 150–400)
RBC: 4.02 MIL/uL (ref 3.87–5.11)
RDW: 14.2 % (ref 11.5–15.5)
WBC: 6.5 10*3/uL (ref 4.0–10.5)

## 2018-01-28 LAB — URINALYSIS, ROUTINE W REFLEX MICROSCOPIC
Bilirubin Urine: NEGATIVE
GLUCOSE, UA: NEGATIVE mg/dL
HGB URINE DIPSTICK: NEGATIVE
Ketones, ur: NEGATIVE mg/dL
Leukocytes, UA: NEGATIVE
Nitrite: NEGATIVE
Protein, ur: NEGATIVE mg/dL
SPECIFIC GRAVITY, URINE: 1.006 (ref 1.005–1.030)
pH: 6 (ref 5.0–8.0)

## 2018-01-28 LAB — I-STAT TROPONIN, ED: Troponin i, poc: 0.01 ng/mL (ref 0.00–0.08)

## 2018-01-28 LAB — BRAIN NATRIURETIC PEPTIDE: B Natriuretic Peptide: 298.5 pg/mL — ABNORMAL HIGH (ref 0.0–100.0)

## 2018-01-28 LAB — GLUCOSE, CAPILLARY: Glucose-Capillary: 158 mg/dL — ABNORMAL HIGH (ref 65–99)

## 2018-01-28 MED ORDER — CARVEDILOL 6.25 MG PO TABS
6.2500 mg | ORAL_TABLET | Freq: Two times a day (BID) | ORAL | Status: DC
  Administered 2018-01-28 – 2018-01-31 (×6): 6.25 mg via ORAL
  Filled 2018-01-28 (×6): qty 1

## 2018-01-28 MED ORDER — ATORVASTATIN CALCIUM 20 MG PO TABS
20.0000 mg | ORAL_TABLET | Freq: Every day | ORAL | Status: DC
  Administered 2018-01-28 – 2018-01-31 (×4): 20 mg via ORAL
  Filled 2018-01-28 (×6): qty 1

## 2018-01-28 MED ORDER — INSULIN ASPART 100 UNIT/ML ~~LOC~~ SOLN: 0.0000 [IU] | Freq: Every day | SUBCUTANEOUS | Status: DC

## 2018-01-28 MED ORDER — TECHNETIUM TO 99M ALBUMIN AGGREGATED
4.0000 | Freq: Once | INTRAVENOUS | Status: AC | PRN
  Administered 2018-01-28: 4 via INTRAVENOUS

## 2018-01-28 MED ORDER — SODIUM CHLORIDE 0.9 % IV SOLN: 250.0000 mL | INTRAVENOUS | Status: DC | PRN

## 2018-01-28 MED ORDER — SODIUM CHLORIDE 0.9% FLUSH
3.0000 mL | Freq: Two times a day (BID) | INTRAVENOUS | Status: DC
  Administered 2018-01-29 (×2): 3 mL via INTRAVENOUS

## 2018-01-28 MED ORDER — PANTOPRAZOLE SODIUM 40 MG PO TBEC
40.0000 mg | DELAYED_RELEASE_TABLET | Freq: Every day | ORAL | Status: DC
  Administered 2018-01-29 – 2018-01-31 (×3): 40 mg via ORAL
  Filled 2018-01-28 (×3): qty 1

## 2018-01-28 MED ORDER — LEVALBUTEROL HCL 0.63 MG/3ML IN NEBU
0.6300 mg | INHALATION_SOLUTION | Freq: Four times a day (QID) | RESPIRATORY_TRACT | Status: DC
  Administered 2018-01-29 (×2): 0.63 mg via RESPIRATORY_TRACT
  Filled 2018-01-28 (×3): qty 3

## 2018-01-28 MED ORDER — TECHNETIUM TC 99M DIETHYLENETRIAME-PENTAACETIC ACID
30.0000 | Freq: Once | INTRAVENOUS | Status: AC | PRN
  Administered 2018-01-28: 30 via RESPIRATORY_TRACT

## 2018-01-28 MED ORDER — ASPIRIN EC 81 MG PO TBEC
81.0000 mg | DELAYED_RELEASE_TABLET | Freq: Every day | ORAL | Status: DC
  Administered 2018-01-29 – 2018-01-31 (×3): 81 mg via ORAL
  Filled 2018-01-28 (×4): qty 1

## 2018-01-28 MED ORDER — SODIUM CHLORIDE 0.9% FLUSH: 3.0000 mL | INTRAVENOUS | Status: DC | PRN

## 2018-01-28 MED ORDER — FUROSEMIDE 40 MG PO TABS
40.0000 mg | ORAL_TABLET | ORAL | Status: DC
  Administered 2018-01-29: 40 mg via ORAL
  Filled 2018-01-28: qty 1

## 2018-01-28 MED ORDER — LOSARTAN POTASSIUM 50 MG PO TABS
50.0000 mg | ORAL_TABLET | Freq: Every day | ORAL | Status: DC
  Administered 2018-01-29 – 2018-01-31 (×3): 50 mg via ORAL
  Filled 2018-01-28 (×3): qty 1

## 2018-01-28 MED ORDER — POLYETHYL GLYCOL-PROPYL GLYCOL 0.4-0.3 % OP GEL: Freq: Two times a day (BID) | OPHTHALMIC | Status: DC | PRN

## 2018-01-28 MED ORDER — INSULIN ASPART 100 UNIT/ML ~~LOC~~ SOLN: 0.0000 [IU] | Freq: Three times a day (TID) | SUBCUTANEOUS | Status: DC

## 2018-01-28 MED ORDER — HYPROMELLOSE (GONIOSCOPIC) 2.5 % OP SOLN
1.0000 [drp] | Freq: Two times a day (BID) | OPHTHALMIC | Status: DC | PRN
  Filled 2018-01-28: qty 15

## 2018-01-28 MED ORDER — ACETAMINOPHEN 325 MG PO TABS: 325.0000 mg | ORAL_TABLET | Freq: Four times a day (QID) | ORAL | Status: DC | PRN

## 2018-01-28 MED ORDER — ONDANSETRON HCL 4 MG/2ML IJ SOLN: 4.0000 mg | Freq: Four times a day (QID) | INTRAMUSCULAR | Status: DC | PRN

## 2018-01-28 MED ORDER — BUDESONIDE 0.25 MG/2ML IN SUSP
0.2500 mg | Freq: Two times a day (BID) | RESPIRATORY_TRACT | Status: DC
  Administered 2018-01-29: 0.25 mg via RESPIRATORY_TRACT
  Filled 2018-01-28 (×3): qty 2

## 2018-01-28 MED ORDER — ONDANSETRON HCL 4 MG PO TABS: 4.0000 mg | ORAL_TABLET | Freq: Four times a day (QID) | ORAL | Status: DC | PRN

## 2018-01-28 MED ORDER — ENOXAPARIN SODIUM 40 MG/0.4ML ~~LOC~~ SOLN
40.0000 mg | SUBCUTANEOUS | Status: DC
  Administered 2018-01-28 – 2018-01-29 (×2): 40 mg via SUBCUTANEOUS
  Filled 2018-01-28 (×2): qty 0.4

## 2018-01-28 NOTE — ED Notes (Signed)
Pt ambulated down hallway, respirations went up to 26 patient states she feels short of breath, able to walk with walker and steady gait, oxygen dropped to 86% at end of walk

## 2018-01-28 NOTE — H&P (Addendum)
History and Physical    Krista Mason JAS:[id number] DOB: 16-May-1936 DOA: 01/28/2018    PCP: Leighton Ruff, MD  Patient coming from: [facility]  Chief Complaint: dyspnea on exertion and chest pan   HPI: Krista Mason is a 82 y.o. female with medical history of HTN, [place]-term smoker who quit last year, histoplasmosis, PVCs  s/p ablation 2/18 as they were though to be causing cardiomyopathy, systolic CHF,  rheumatic fever with moderate mitral regurgitation and diet controlled DM.  She developed the flu on March 26.  She took Tylenol only for this.  She was also noted to have a left buttock abscess and was started on doxycycline for 10 days which she has completed.  Today she went for follow-up visit with her PCP. She complained of feeling dyspnea on exertion. She also complained of intermittent chest pain which felt like heaviness and an irregular heartbeat during this chest pain.  Patient is not sure if this pain is related to exertion or food.  When asked if it is with exertion, she states yes it is but when she walked in the ER today she states did not notice any chest pain "I was just focused on breathing".  This pain has not been causing her to wake up at night.  Intermittent No complaints of nausea vomiting abdominal pain or diarrhea. No residual cough or congestion.  No sore throat. She was asked by her PCP to come into the ED.  ED Course: Pulse ox 86% on exertion with dyspnea CT chest without contrast shows no acute pulmonary process - VQ scan neg for PE  Review of Systems:  All other systems reviewed and apart from HPI, are negative.  Past Medical History:  Diagnosis Date  . Atrial fibrillation ([place])   . Chronic combined systolic and diastolic CHF (congestive heart failure) ([place])    a. LV dysfcuntion felt to be related to PVCs  . Chronic kidney disease   . Diabetes mellitus without complication ([place])   . Hx of echocardiogram    Echo (1/16):  EF 60-65%, no  RWMA, Gr 1 DD, mod AI, mod MR, mild LAE  . Hyperkalemia   . Hyperlipidemia   . Hypertension   . Orthostatic hypotension   . PVC (premature ventricular contraction)    a. s/p PVC ablation in 11/2016  . Rheumatic fever   . Sleep apnea     Past Surgical History:  Procedure Laterality Date  . KNEE SURGERY Left 2009  . OVARIAN CYST REMOVAL    . PVC ABLATION N/A 11/30/2016   Procedure: PVC Ablation;  Surgeon: Will [name], MD;  Location: [facility];  Service: Cardiovascular;  Laterality: N/A;  . RIGHT/LEFT HEART CATH AND CORONARY ANGIOGRAPHY N/A 01/25/2017   Procedure: Right/Left Heart Cath and Coronary Angiography;  Surgeon: Jolaine Artist, MD;  Location: [facility];  Service: Cardiovascular;  Laterality: N/A;    Social History:   reports that she quit smoking about 14 months ago.  She started smoking in the 1960s S people destroyed themselves and we are trying to figure out what is wrong with right he reports that she does not drink alcohol or use drugs.  Allergies  Allergen Reactions  . Penicillins Itching and Rash    Amoxicillin ok- IM pen is what gives reaction  Has patient had a PCN reaction causing immediate rash, facial/tongue/throat swelling, SOB or lightheadedness with hypotension: Yes Has patient had a PCN reaction causing severe rash involving  mucus membranes or skin necrosis: No Has patient had a PCN reaction that required hospitalization: No Has patient had a PCN reaction occurring within the last 10 years: No If all of the above answers are "NO", then may proceed with Cephalosporin use.  [name] [Cefaclor] Itching  . Crestor [Rosuvastatin Calcium] Nausea Only  . Epinephrine Other (See Comments)    "feels like heart is beating out of chest"  . Nsaids     Pt states she is not taking because of kidney function  . Other Other (See Comments)    FLU SHOT-swelling, redness, fever  . Percocet [Oxycodone-Acetaminophen] Nausea And Vomiting  .  Prednisone     Dizziness, spiked blood sugar    Family History  Problem Relation Age of Onset  . Heart attack Father   . Heart disease Father   . Cancer Father   . Hypertension Father   . Hypertension Brother   . Stroke Brother   . Hypertension Brother   . Diabetes Son   . Hyperlipidemia Son   . Hypertension Son      Prior to Admission medications   Medication Sig Start Date End Date Taking? Authorizing Provider  acetaminophen (TYLENOL) 325 MG tablet Take 325 mg by mouth every 6 (six) hours as needed for mild pain.    Yes [provider]  aspirin 81 MG tablet Take 1 tablet (81 mg total) by mouth daily. 12/31/16  Yes Bensimhon, Shaune Pascal, MD  atorvastatin (LIPITOR) 20 MG tablet TAKE 1 TABLET BY MOUTH DAILY Patient taking differently: Take 40 mg by mouth at bedtime 05/17/17  Yes Camnitz, Will Hassell Done, MD  carvedilol (COREG) 6.25 MG tablet Take 1 tablet (6.25 mg total) by mouth 2 (two) times daily. 04/09/17  Yes Camnitz, Ocie Doyne, MD  cholecalciferol (VITAMIN D) 1000 UNITS tablet Take 1,000 Units by mouth daily.    Yes [provider]  ferrous gluconate (FERGON) 324 MG tablet Take 324 mg by mouth every other day.   Yes [provider]  furosemide (LASIX) 40 MG tablet Take 1 tablet (40 mg total) by mouth every other day. 10/04/17  Yes Bensimhon, Shaune Pascal, MD  losartan (COZAAR) 50 MG tablet Take 1 tablet (50 mg total) by mouth daily. 12/10/16  Yes [name], PA-C  omeprazole (PRILOSEC) 40 MG capsule Take 40 mg by mouth daily.    Yes [provider]  Polyethyl Glycol-Propyl Glycol (SYSTANE OP) Place 1 drop into both eyes 2 (two) times daily as needed (dryness).   Yes [provider]    Physical Exam: Wt Readings from Last 3 Encounters:  01/28/18 68.9 kg (152 lb)  10/02/17 67.5 kg (148 lb 12 oz)  05/31/17 64 kg (141 lb)   Vitals:   01/28/18 1530 01/28/18 1600 01/28/18 1645 01/28/18 1715  BP: (!) 179/72 (!) 175/69 (!) 169/68  (!) 175/75  Pulse: 69 71 82 72  Resp: 19 15 18 16   Temp:      TempSrc:      SpO2: 100% 99% 97% 100%  Weight:          Constitutional: NAD, calm, comfortable Eyes: PERTLA, lids and conjunctivae normal ENMT: Mucous membranes are moist. Posterior pharynx clear of any exudate or lesions. Normal dentition.  Neck: normal, supple, no masses, no thyromegaly Respiratory: clear to auscultation bilaterally, no wheezing, no crackles. Normal respiratory effort. No accessory muscle use.  Cardiovascular: S1 & S2 heard, regular rate and rhythm, no murmurs / rubs / gallops. No extremity edema.  2+ pedal pulses. No carotid bruits.  Abdomen: No distension, no tenderness, no masses palpated. No hepatosplenomegaly. Bowel sounds normal.  Musculoskeletal: no clubbing / cyanosis. No joint deformity upper and lower extremities. Good ROM, no contractures. Normal muscle tone.  Skin: no rashes, lesions, ulcers. No induration Neurologic: CN 2-12 grossly intact. Sensation intact, DTR normal. Strength 5/5 in all 4 limbs.  Psychiatric: Normal judgment and insight. Alert and oriented x 3. Normal mood.     Labs on Admission: I have personally reviewed following labs and imaging studies  CBC: Recent Labs  Lab 01/28/18 1301  WBC 6.5  HGB 11.5*  HCT 35.4*  MCV 88.1  PLT 361   Basic Metabolic Panel: Recent Labs  Lab 01/28/18 1301  NA 134*  K 4.2  CL 102  CO2 23  GLUCOSE 111*  BUN 33*  CREATININE 1.82*  CALCIUM 9.9   GFR: CrCl cannot be calculated (Unknown ideal weight.). Liver Function Tests: Recent Labs  Lab 01/28/18 1301  AST 17  ALT 9*  ALKPHOS 104  BILITOT 0.6  PROT 6.6  ALBUMIN 3.7   No results for input(s): LIPASE, AMYLASE in the last 168 hours. No results for input(s): AMMONIA in the last 168 hours. Coagulation Profile: No results for input(s): INR, PROTIME in the last 168 hours. Cardiac Enzymes: No results for input(s): CKTOTAL, CKMB, CKMBINDEX, TROPONINI in the last 168  hours. BNP (last 3 results) No results for input(s): PROBNP in the last 8760 hours. HbA1C: No results for input(s): HGBA1C in the last 72 hours. CBG: No results for input(s): GLUCAP in the last 168 hours. Lipid Profile: No results for input(s): CHOL, HDL, LDLCALC, TRIG, CHOLHDL, LDLDIRECT in the last 72 hours. Thyroid Function Tests: No results for input(s): TSH, T4TOTAL, FREET4, T3FREE, THYROIDAB in the last 72 hours. Anemia Panel: No results for input(s): VITAMINB12, FOLATE, FERRITIN, TIBC, IRON, RETICCTPCT in the last 72 hours. Urine analysis:    Component Value Date/Time   COLORURINE STRAW (A) 01/28/2018 1353   APPEARANCEUR CLEAR 01/28/2018 1353   LABSPEC 1.006 01/28/2018 1353   PHURINE 6.0 01/28/2018 1353   GLUCOSEU NEGATIVE 01/28/2018 1353   HGBUR NEGATIVE 01/28/2018 1353   BILIRUBINUR NEGATIVE 01/28/2018 1353   KETONESUR NEGATIVE 01/28/2018 1353   PROTEINUR NEGATIVE 01/28/2018 1353   UROBILINOGEN 0.2 04/15/2012 1451   NITRITE NEGATIVE 01/28/2018 1353   LEUKOCYTESUR NEGATIVE 01/28/2018 1353   Sepsis Labs: @LABRCNTIP (procalcitonin:4,lacticidven:4) )No results found for this or any previous visit (from the past 240 hour(s)).   Radiological Exams on Admission: Dg Chest 2 View  Result Date: 01/28/2018 CLINICAL DATA:  Shortness of breath. EXAM: CHEST - 2 VIEW COMPARISON:  01/28/2018, 11/06/2016.  CT 07/11/2016 FINDINGS: Stable mild mediastinal fullness, most likely vascular. Calcified left AP window lymph node noted. Aortic and bilateral subclavian atherosclerotic vascular calcification. Stable cardiomegaly. No pulmonary venous congestion. No focal infiltrate. No pleural effusion or pneumothorax. Stable lower thoracic vertebral body compression fracture. IMPRESSION: 1. No acute cardiopulmonary disease. Stable calcified left AP window lymph nodes. 2. Stable cardiomegaly. Stable aortic and peripheral vascular disease. Electronically Signed   By: Marcello Moores  Register   On: 01/28/2018  13:45   Ct Chest Wo Contrast  Result Date: 01/28/2018 CLINICAL DATA:  Chest pain and shortness of breath, worse with exertion for 1 week. Ex smoker. History of CHF, hypertension. EXAM: CT CHEST WITHOUT CONTRAST TECHNIQUE: Multidetector CT imaging of the chest was performed following the standard protocol without IV contrast. COMPARISON:  Chest radiograph January 28, 2018 and CT chest July 19, 2016 FINDINGS: CARDIOVASCULAR: Heart is mildly enlarged. Trace pericardial effusion. Severe coronary artery calcifications. Thoracic aorta is normal course and caliber, severe calcific atherosclerosis. MEDIASTINUM/NODES: Coarsely calcified aortopulmonary window lymph nodes. No lymphadenopathy by CT size criteria. Normal appearance of thoracic esophagus though not tailored for evaluation. LUNGS/PLEURA: Tracheobronchial tree is patent, no pneumothorax. Mild centrilobular emphysema with upper lobe predominance. Mild biapical bullous changes. Bibasilar atelectasis/scarring. No focal consolidation or pleural effusion. A few punctate scattered calcified granulomas. UPPER ABDOMEN: Calcified granulomas in spleen. Multiple renal cysts measuring to 2.5 cm. MUSCULOSKELETAL: Nonacute.  Old mild L1 compression fracture. IMPRESSION: 1. Mild cardiomegaly.  No acute pulmonary process by noncontrast CT. 2. Old granulomatous disease. 3. Aortic Atherosclerosis (ICD10-I70.0) and Emphysema (ICD10-J43.9). Electronically Signed   By: Elon Alas M.D.   On: 01/28/2018 16:42   Nm Pulmonary Vent And Perf (v/q Scan)  Result Date: 01/28/2018 CLINICAL DATA:  Shortness of breath on exertion. EXAM: NUCLEAR MEDICINE VENTILATION - PERFUSION LUNG SCAN TECHNIQUE: Ventilation images were obtained in multiple projections using inhaled aerosol Tc-61m DTPA. Perfusion images were obtained in multiple projections after intravenous injection of Tc-43m-MAA. RADIOPHARMACEUTICALS:  6 mCi of Tc-72m DTPA aerosol inhalation and 30 mCi Tc68m-MAA IV COMPARISON:   CT chest this same day. FINDINGS: Ventilation: No focal ventilation defect. Perfusion: No wedge shaped peripheral perfusion defects to suggest acute pulmonary embolism. IMPRESSION: Negative for pulmonary embolus. Electronically Signed   By: Inge Rise M.D.   On: 01/28/2018 17:21    EKG: Independently reviewed.  Sinus rhythm with left bundle branch block which is chronic  Assessment/Plan Active Problems: Dyspnea and hypoxia on exertion-nicotine abuse for 50+ years- recent influenza -[place]-term smoker who quit last year but tries to down play this -suspect she may have an undiagnosed COPD-at this point she may have airway reactivity after the Flu which is worse when she exerts herself-she is hesitant to start oral steroids as prednisone 2 years ago "made her sugars go up" - have convinced her to take Pulmicort and will add Xopenex 4 times daily -Tomorrow morning she should have another pulse ox checked on exertion  Chest pain - it may be related to above however her history is poor- at one point she states its pressure like with fluttering and later states that it is occurring with exertion although she did notice it when she walked in the ER - Monitor on telemetry for arrhythmias-PVCs ablated last year  --she did just complete a course of doxycycline-and has a history of esophageal stricture which was dilated a few years ago therefore will need to see if this pain is with eating   Chronic  systolic heart failure -Currently compensated-continue Coreg, Lasix, losartan -Most recent echo on 8/18 showed an EF of 40-45% which had improved from 30-35%  Stage 3-4 CKD - stable  DM2 - not on medicaitons- on carb mod diet at home - ISS Ordered  Esophageal stricture status post dilatation -Continue PPI-she does not complain of dysphagia    DVT prophylaxis: Lovenox Code Status: Full code but does not want to remain intubated if she has a poor prognosis Family Communication: Daughter at  bedside Disposition Plan: Telemetry  Consults called: None Admission status: Observation   Debbe Odea MD [facility] Pager: [email or web address] Password [email or web address] 7PM-7AM, please contact night-coverage   01/28/2018, 5:56 PM

## 2018-01-28 NOTE — ED Notes (Signed)
Pt stated that BP cuff was bothering her. BP cuff removed.

## 2018-01-28 NOTE — ED Triage Notes (Signed)
PT arrives via EMS from assisted living for worsening SOB. PT reports episodes of chest pain as well. This has been going on for over 1 week. PT had an ablation in 2018 for frequent PVCs. PT reports this ablation caused a LBBB. PT has some EKG changes today per EMS. PT reports exertional SOB has gotten worse and worse. PT was given 324 ASA in route.

## 2018-01-28 NOTE — ED Provider Notes (Signed)
[facility] EMERGENCY DEPARTMENT Provider Note   CSN: [id number] Arrival date & time: 01/28/18  1218     History   Chief Complaint Chief Complaint  Patient presents with  . Chest Pain    HPI Krista Mason is a 82 y.o. female.  HPI   Krista Mason is a 82 y.o. female, with a history of A. fib, CHF, CKD, DM, hyperlipidemia, HTN,, presenting to the ED with shortness of breath worsening for the last 2 weeks. Her shortness of breath will come on at rest occasionally, but mostly with exertion. Has occasionally had to use her home oxygen.   Before this time she was doing daily exercises and time on an elliptical machine, but now she has difficulty walking short distances. States she will intermittently have a "funny feeling" in the chest that lasts for a few minutes, occurs at rest or with exertion, accompanied by lightheadedness and nausea.  Patient states she was clinically diagnosed with influenza by her PCP March 26 due to symptoms of fever, cough, congestion, shortness of breath, nausea, and vomiting.  Simultaneously diagnosed with an abscess on her buttocks for which she was prescribed 10 days of doxycycline, finished this course on April 4. Cough, congestion, and vomiting seem to have resolved.  Denies syncope, diaphoresis, diarrhea, abdominal pain, peripheral edema, increased orthopnea, urinary symptoms, or any other complaints.   Past Medical History:  Diagnosis Date  . Atrial fibrillation ([place])   . Chronic combined systolic and diastolic CHF (congestive heart failure) ([place])    a. LV dysfcuntion felt to be related to PVCs  . Chronic kidney disease   . Diabetes mellitus without complication ([place])   . Hx of echocardiogram    Echo (1/16):  EF 60-65%, no RWMA, Gr 1 DD, mod AI, mod MR, mild LAE  . Hyperkalemia   . Hyperlipidemia   . Hypertension   . Orthostatic hypotension   . PVC (premature ventricular contraction)    a. s/p PVC ablation in 11/2016  .  Rheumatic fever   . Sleep apnea     Patient Active Problem List   Diagnosis Date Noted  . COPD  GOLD 0  01/17/2017  . Chronic systolic CHF (congestive heart failure) ([place]) 11/13/2016  . PVC (premature ventricular contraction) 11/13/2016  . HLD (hyperlipidemia) 11/05/2016  . CKD (chronic kidney disease), stage III ([place]) 11/05/2016  . Tobacco abuse 11/05/2016  . Multifocal PVCs 11/05/2016  . Moderate mitral regurgitation 11/05/2016  . Microcytic anemia 11/05/2016  . Diabetes mellitus with complication ([place])   . Uncontrolled hypertension 06/18/2016  . Multifocal atrial tachycardia ([place]) 11/26/2014  . PVD (peripheral vascular disease) ([place]) 12/15/2012    Past Surgical History:  Procedure Laterality Date  . KNEE SURGERY Left 2009  . OVARIAN CYST REMOVAL    . PVC ABLATION N/A 11/30/2016   Procedure: PVC Ablation;  Surgeon: Will [name], MD;  Location: [facility];  Service: Cardiovascular;  Laterality: N/A;  . RIGHT/LEFT HEART CATH AND CORONARY ANGIOGRAPHY N/A 01/25/2017   Procedure: Right/Left Heart Cath and Coronary Angiography;  Surgeon: Jolaine Artist, MD;  Location: [facility];  Service: Cardiovascular;  Laterality: N/A;     OB History   None      Home Medications    Prior to Admission medications   Medication Sig Start Date End Date Taking? Authorizing Provider  acetaminophen (TYLENOL) 325 MG tablet Take 325 mg by mouth every 6 (six) hours as needed for mild pain.  Yes [provider]  aspirin 81 MG tablet Take 1 tablet (81 mg total) by mouth daily. 12/31/16  Yes Bensimhon, Shaune Pascal, MD  atorvastatin (LIPITOR) 20 MG tablet TAKE 1 TABLET BY MOUTH DAILY Patient taking differently: Take 40 mg by mouth at bedtime 05/17/17  Yes Camnitz, Will Hassell Done, MD  carvedilol (COREG) 6.25 MG tablet Take 1 tablet (6.25 mg total) by mouth 2 (two) times daily. 04/09/17  Yes Camnitz, Ocie Doyne, MD  cholecalciferol (VITAMIN D) 1000 UNITS tablet Take 1,000 Units  by mouth daily.    Yes [provider]  ferrous gluconate (FERGON) 324 MG tablet Take 324 mg by mouth every other day.   Yes [provider]  furosemide (LASIX) 40 MG tablet Take 1 tablet (40 mg total) by mouth every other day. 10/04/17  Yes Bensimhon, Shaune Pascal, MD  losartan (COZAAR) 50 MG tablet Take 1 tablet (50 mg total) by mouth daily. 12/10/16  Yes [name], PA-C  omeprazole (PRILOSEC) 40 MG capsule Take 40 mg by mouth daily.    Yes [provider]  Polyethyl Glycol-Propyl Glycol (SYSTANE OP) Place 1 drop into both eyes 2 (two) times daily as needed (dryness).   Yes [provider]    Family History Family History  Problem Relation Age of Onset  . Heart attack Father   . Heart disease Father   . Cancer Father   . Hypertension Father   . Hypertension Brother   . Stroke Brother   . Hypertension Brother   . Diabetes Son   . Hyperlipidemia Son   . Hypertension Son     Social History Social History   Tobacco Use  . Smoking status: Former Smoker    Packs/day: 0.75    Years: 60.00    Pack years: 45.00    Last attempt to quit: 11/04/2016    Years since quitting: 1.2  . Smokeless tobacco: Never Used  Substance Use Topics  . Alcohol use: No  . Drug use: No     Allergies   Penicillins; Ceclor [cefaclor]; Crestor [rosuvastatin calcium]; Epinephrine; Nsaids; Other; Percocet [oxycodone-acetaminophen]; and Prednisone   Review of Systems Review of Systems  Constitutional: Negative for chills, diaphoresis and fever.  Respiratory: Positive for shortness of breath. Negative for cough.   Cardiovascular: Positive for chest pain. Negative for leg swelling.  Gastrointestinal: Positive for nausea. Negative for abdominal pain, blood in stool, diarrhea and vomiting.  Musculoskeletal: Negative for back pain and neck pain.  Neurological: Positive for weakness and light-headedness. Negative for syncope.  All other systems reviewed and are  negative.    Physical Exam Updated Vital Signs BP (!) 179/72   Pulse 78   Temp 98.5 F (36.9 C) (Oral)   Resp 16   Wt 68.9 kg (152 lb)   SpO2 100%   BMI 25.29 kg/m   Physical Exam  Constitutional: She appears well-developed and well-nourished. No distress.  HENT:  Head: Normocephalic and atraumatic.  Eyes: Conjunctivae are normal.  Neck: Neck supple.  Cardiovascular: Normal rate, regular rhythm, normal heart sounds and intact distal pulses.  Pulmonary/Chest: Effort normal and breath sounds normal. No respiratory distress.  Abdominal: Soft. There is no tenderness. There is no guarding.  Musculoskeletal: She exhibits no edema.  Lymphadenopathy:    She has no cervical adenopathy.  Neurological: She is alert.  Skin: Skin is warm and dry. She is not diaphoretic.  Psychiatric: She has a normal mood and affect. Her behavior is normal.  Nursing note and  vitals reviewed.    ED Treatments / Results  Labs (all labs ordered are listed, but only abnormal results are displayed) Labs Reviewed  COMPREHENSIVE METABOLIC PANEL - Abnormal; Notable for the following components:      Result Value   Sodium 134 (*)    Glucose, Bld 111 (*)    BUN 33 (*)    Creatinine, Ser 1.82 (*)    ALT 9 (*)    GFR calc non Af Amer 25 (*)    GFR calc Af Amer 29 (*)    All other components within normal limits  CBC WITH DIFFERENTIAL/PLATELET - Abnormal; Notable for the following components:   Hemoglobin 11.5 (*)    HCT 35.4 (*)    All other components within normal limits  BRAIN NATRIURETIC PEPTIDE - Abnormal; Notable for the following components:   B Natriuretic Peptide 298.5 (*)    All other components within normal limits  URINALYSIS, ROUTINE W REFLEX MICROSCOPIC - Abnormal; Notable for the following components:   Color, Urine STRAW (*)    All other components within normal limits  I-STAT TROPONIN, ED    EKG EKG Interpretation  Date/Time:  Tuesday January 28 2018 12:24:14 EDT Ventricular  Rate:  78 PR Interval:    QRS Duration: 148 QT Interval:  423 QTC Calculation: 482 R Axis:   -71 Text Interpretation:  Sinus rhythm Prolonged PR interval Left bundle branch block since last tracing no significant change Confirmed by Daleen Bo [phone]) on 01/28/2018 1:13:14 PM   Radiology Dg Chest 2 View  Result Date: 01/28/2018 CLINICAL DATA:  Shortness of breath. EXAM: CHEST - 2 VIEW COMPARISON:  01/28/2018, 11/06/2016.  CT 07/11/2016 FINDINGS: Stable mild mediastinal fullness, most likely vascular. Calcified left AP window lymph node noted. Aortic and bilateral subclavian atherosclerotic vascular calcification. Stable cardiomegaly. No pulmonary venous congestion. No focal infiltrate. No pleural effusion or pneumothorax. Stable lower thoracic vertebral body compression fracture. IMPRESSION: 1. No acute cardiopulmonary disease. Stable calcified left AP window lymph nodes. 2. Stable cardiomegaly. Stable aortic and peripheral vascular disease. Electronically Signed   By: Marcello Moores  Register   On: 01/28/2018 13:45    Procedures Procedures (including critical care time)  Medications Ordered in ED Medications - No data to display   Initial Impression / Assessment and Plan / ED Course  I have reviewed the triage vital signs and the nursing notes.  Pertinent labs & imaging results that were available during my care of the patient were reviewed by me and considered in my medical decision making (see chart for details).  Clinical Course as of Jan 28 1629  Tue Jan 28, 2018  1440 Consistent with previous values.  BUN(!): 33 [SJ]  1441 Consistent with previous values.  Creatinine(!): 1.82 [SJ]  1542 Within range of previous value of 12.5.  Hemoglobin(!): 11.5 [SJ]  1612 Spoke with Erin Hearing, hospitalist. Agreed to admit the patient.    [SJ]    Clinical Course User Index [SJ] Veera Stapleton C, PA-C    Patient presents with shortness of breath, mostly on exertion, with intermittent chest  discomfort.  CHF exacerbation is a possibility. Patient's BNP of 298 is elevated from last value of 212 a year ago.  No correlating changes on chest x-ray, lungs sound findings, or peripheral edema. Patient quickly became short of breath, tachypneic, and SPO2 dropped to 86% during ambulation.  Admission for further evaluation and management.  CT of the chest and VQ scan pending on admission.  Findings and plan of care  discussed with Daleen Bo, MD. Dr. Eulis Foster personally evaluated and examined this patient.   Vitals:   01/28/18 1430 01/28/18 1445 01/28/18 1530 01/28/18 1600  BP: (!) 142/88 (!) 161/66 (!) 179/72 (!) 175/69  Pulse: 70 66 69 71  Resp: 14 15 19 15   Temp:      TempSrc:      SpO2: 98% 96% 100% 99%  Weight:         Final Clinical Impressions(s) / ED Diagnoses   Final diagnoses:  Shortness of breath    ED Discharge Orders    None       Layla Maw 01/28/18 1631    Daleen Bo, MD 01/28/18 2159

## 2018-01-28 NOTE — ED Provider Notes (Signed)
  Face-to-face evaluation   History: She presents for evaluation of shortness of breath following recent influenza infection, 1 month ago.  She states she gets short of breath with any activity but improves with rest.  She checks her oxygen saturation at home and it runs between 91 and 99%.  She saw her PCP this morning who stated that she did not look well, so sent her here.   Physical exam: Elderly female, alert and cooperative.  No respiratory distress.  She is lying semirecumbent in no apparent distress.  Lungs clear bilaterally.  Heart regular rate and rhythm without murmur.   Medical screening examination/treatment/procedure(s) were conducted as a shared visit with non-physician practitioner(s) and myself.  I personally evaluated the patient during the encounter    Daleen Bo, MD 01/28/18 2159

## 2018-01-28 NOTE — ED Notes (Signed)
PT returns from Xray

## 2018-01-28 NOTE — Telephone Encounter (Signed)
Pt called concerned about her SOB.  She states she had the flu about 3-4 weeks ago and has just not recovered well, she states the SOB has been getting worse over the past several weeks and she now gets SOB w/little exertion.  She states her wt has been pretty stable and she does not have any edema.  She also mentiones that at times she feels like her heart is jumping or skipping around.  Pt states the only day she has transportation is Wed or Friday.  Sch appt for Fri 4/12 w/Amy Ninfa Meeker, NP

## 2018-01-28 NOTE — ED Notes (Signed)
Patient transported to CT 

## 2018-01-28 NOTE — ED Notes (Signed)
Patient transported to X-ray 

## 2018-01-29 ENCOUNTER — Encounter (HOSPITAL_COMMUNITY): Payer: Self-pay

## 2018-01-29 ENCOUNTER — Observation Stay (HOSPITAL_BASED_OUTPATIENT_CLINIC_OR_DEPARTMENT_OTHER): Payer: Medicare HMO

## 2018-01-29 DIAGNOSIS — R0600 Dyspnea, unspecified: Secondary | ICD-10-CM | POA: Diagnosis not present

## 2018-01-29 DIAGNOSIS — R0902 Hypoxemia: Secondary | ICD-10-CM

## 2018-01-29 DIAGNOSIS — I5023 Acute on chronic systolic (congestive) heart failure: Secondary | ICD-10-CM | POA: Diagnosis not present

## 2018-01-29 DIAGNOSIS — N183 Chronic kidney disease, stage 3 (moderate): Secondary | ICD-10-CM | POA: Diagnosis not present

## 2018-01-29 DIAGNOSIS — I361 Nonrheumatic tricuspid (valve) insufficiency: Secondary | ICD-10-CM | POA: Diagnosis not present

## 2018-01-29 DIAGNOSIS — R0602 Shortness of breath: Secondary | ICD-10-CM | POA: Diagnosis not present

## 2018-01-29 DIAGNOSIS — E118 Type 2 diabetes mellitus with unspecified complications: Secondary | ICD-10-CM | POA: Diagnosis not present

## 2018-01-29 DIAGNOSIS — I5022 Chronic systolic (congestive) heart failure: Secondary | ICD-10-CM | POA: Diagnosis not present

## 2018-01-29 LAB — GLUCOSE, CAPILLARY
GLUCOSE-CAPILLARY: 167 mg/dL — AB (ref 65–99)
Glucose-Capillary: 122 mg/dL — ABNORMAL HIGH (ref 65–99)
Glucose-Capillary: 123 mg/dL — ABNORMAL HIGH (ref 65–99)
Glucose-Capillary: 127 mg/dL — ABNORMAL HIGH (ref 65–99)

## 2018-01-29 LAB — BASIC METABOLIC PANEL
Anion gap: 12 (ref 5–15)
BUN: 30 mg/dL — AB (ref 6–20)
CHLORIDE: 102 mmol/L (ref 101–111)
CO2: 22 mmol/L (ref 22–32)
CREATININE: 1.63 mg/dL — AB (ref 0.44–1.00)
Calcium: 9.5 mg/dL (ref 8.9–10.3)
GFR calc non Af Amer: 29 mL/min — ABNORMAL LOW (ref >60)
GFR, EST AFRICAN AMERICAN: 33 mL/min — AB (ref >60)
GLUCOSE: 87 mg/dL (ref 65–99)
Potassium: 3.8 mmol/L (ref 3.5–5.1)
Sodium: 136 mmol/L (ref 135–145)

## 2018-01-29 LAB — CBC
HCT: 33 % — ABNORMAL LOW (ref 36.0–46.0)
Hemoglobin: 10.5 g/dL — ABNORMAL LOW (ref 12.0–15.0)
MCH: 28.1 pg (ref 26.0–34.0)
MCHC: 31.8 g/dL (ref 30.0–36.0)
MCV: 88.2 fL (ref 78.0–100.0)
PLATELETS: 204 10*3/uL (ref 150–400)
RBC: 3.74 MIL/uL — AB (ref 3.87–5.11)
RDW: 14.4 % (ref 11.5–15.5)
WBC: 5.5 10*3/uL (ref 4.0–10.5)

## 2018-01-29 LAB — ECHOCARDIOGRAM COMPLETE: WEIGHTICAEL: 2414.4 [oz_av]

## 2018-01-29 LAB — TROPONIN I: Troponin I: <0.03 ng/mL (ref <0.03)

## 2018-01-29 MED ORDER — FUROSEMIDE 10 MG/ML IJ SOLN
40.0000 mg | Freq: Once | INTRAMUSCULAR | Status: AC
  Administered 2018-01-29: 40 mg via INTRAVENOUS
  Filled 2018-01-29: qty 4

## 2018-01-29 MED ORDER — LEVALBUTEROL HCL 0.63 MG/3ML IN NEBU: 0.6300 mg | INHALATION_SOLUTION | Freq: Four times a day (QID) | RESPIRATORY_TRACT | Status: DC | PRN

## 2018-01-29 MED ORDER — FUROSEMIDE 40 MG PO TABS: 40.0000 mg | ORAL_TABLET | ORAL | Status: DC

## 2018-01-29 NOTE — Progress Notes (Signed)
SATURATION QUALIFICATIONS: (This note is used to comply with regulatory documentation for home oxygen)  Patient Saturations on Room Air at Rest = 99%  Patient Saturations on Room Air while Ambulating = 98%  Patient Saturations on Liters of oxygen while Ambulating =   Please briefly explain why patient needs home oxygen: not at this time.

## 2018-01-29 NOTE — Evaluation (Signed)
Physical Therapy Evaluation Patient Details Name: Krista Mason MRN: [id number] DOB: 1935-12-27 Today's Date: 01/29/2018   History of Present Illness  Pt. is a 82 y.o. F with significant PMH of HTN, [place]cker term smoker who quit last year, histoplasmosis, PVCs s/p ablation 2/1, systolic CHF, diet controlled DM. Of note, patient developed flu on March 26 and noted to have a left buttock abscess. Patient admitted with dyspnea on exertion and intermittent chest pain.  Clinical Impression  Patient very motivated to participate with therapy. At baseline, is very active and enjoys using the Nustep multiple times a week for 45 minutes at a time. However, patient states she has noticed dyspnea on exertion after 5 minutes of activity in the past couple weeks. Patient with decreased functional mobility secondary to diminished endurance and strength on evaluation. Orthostatics obtained including Supine: 145/122, Seated: 118/47, Standing: 122/57, HR: 74. Patient with no complaint of dizziness; RN notified. Able to ambulate 200 feet with RW and supervision; during last 20 feet, patient complaining of increased lower extremity tremulousness and weakness, however, this was not apparently noticeable to PT. Will benefit from continued skilled PT to increase mobility and independence. Patient states PT services are available at her independent living facility and will therefore benefit from follow up services through there if possible.     Follow Up Recommendations Other (comment)(PT at [facility])    Equipment Recommendations  None recommended by PT    Recommendations for Other Services       Precautions / Restrictions Precautions Precautions: Fall Restrictions Weight Bearing Restrictions: No      Mobility  Bed Mobility Overal bed mobility: Modified Independent             General bed mobility comments: Modified independent with supine to sit using bed rail to elevate trunk with  increased time  Transfers Overall transfer level: Modified independent Equipment used: None;Rolling walker (2 wheeled)                Ambulation/Gait Ambulation/Gait assistance: Supervision Ambulation Distance (Feet): 200 Feet Assistive device: Rolling walker (2 wheeled) Gait Pattern/deviations: Step-through pattern Gait velocity: decreased Gait velocity interpretation: Below normal speed for age/gender General Gait Details: VC's for proximity to RW but patient did not maintain. DOE 1/4; SpO2 98% on RA. During last 20 feet, patient stating "my legs are shaking and feel like they are going to give out," although not noticeable to observer  Stairs            Wheelchair Mobility    Modified Rankin (Stroke Patients Only)       Balance Overall balance assessment: Needs assistance Sitting-balance support: No upper extremity supported;Feet supported Sitting balance-Leahy Scale: Normal     Standing balance support: Single extremity supported Standing balance-Leahy Scale: Fair                               Pertinent Vitals/Pain Pain Assessment: No/denies pain    Home Living Family/patient expects to be discharged to:: Private residence Living Arrangements: Alone Available Help at Discharge: Neighbor Type of Home: Independent living facility Home Access: Level entry     Home Layout: One level Home Equipment: Environmental consultant - 2 wheels;Shower seat;Walker - 4 wheels      Prior Function Level of Independence: Independent with assistive device(s)         Comments: Patient uses Nustep 3 times a week for 45 minutes. Patient states the past several  weeks she becomes SOB after 5 minutes on Nustep or in the shower. Started using RW in past year due to "dizzy spells."      Hand Dominance        Extremity/Trunk Assessment   Upper Extremity Assessment Upper Extremity Assessment: RUE deficits/detail;LUE deficits/detail RUE Deficits / Details: MMT: R shoulder  flexion 4/5, elbow flexion 5/5, elbow extension 4/5 LUE Deficits / Details: MMT: shoulder flexion 5/5, elbow flexion 5/5, elbow extension 5/5    Lower Extremity Assessment Lower Extremity Assessment: RLE deficits/detail;LLE deficits/detail RLE Deficits / Details: MMT: hip flexion 5/5, knee ext 5/5, ankle dorsiflexion 5/5 LLE Deficits / Details: MMT: hip flexion, knee extension, ankle DF 5/5    Cervical / Trunk Assessment Cervical / Trunk Assessment: Kyphotic  Communication   Communication: No difficulties  Cognition Arousal/Alertness: Awake/alert Behavior During Therapy: WFL for tasks assessed/performed Overall Cognitive Status: Within Functional Limits for tasks assessed                                        General Comments General comments (skin integrity, edema, etc.): Patient instructed on activity recommendations and endurance strategies.    Exercises     Assessment/Plan    PT Assessment Patient needs continued PT services  PT Problem List Decreased strength;Decreased activity tolerance;Decreased balance;Decreased mobility;Cardiopulmonary status limiting activity       PT Treatment Interventions DME instruction;Gait training;Stair training;Functional mobility training;Therapeutic activities;Therapeutic exercise;Balance training;Patient/family education    PT Goals (Current goals can be found in the Care Plan section)  Acute Rehab PT Goals Patient Stated Goal: figure out what's going on PT Goal Formulation: With patient Time For Goal Achievement: 02/12/18 Potential to Achieve Goals: Good    Frequency Min 3X/week   Barriers to discharge        Co-evaluation               AM-PAC PT "6 Clicks" Daily Activity  Outcome Measure Difficulty turning over in bed (including adjusting bedclothes, sheets and blankets)?: None Difficulty moving from lying on back to sitting on the side of the bed? : A Lot Difficulty sitting down on and standing up  from a chair with arms (e.g., wheelchair, bedside commode, etc,.)?: None Help needed moving to and from a bed to chair (including a wheelchair)?: A Little Help needed walking in hospital room?: A Little Help needed climbing 3-5 steps with a railing? : A Little 6 Click Score: 19    End of Session Equipment Utilized During Treatment: Gait belt Activity Tolerance: Patient tolerated treatment well Patient left: Other (comment)(Seated EOB) Nurse Communication: Mobility status PT Visit Diagnosis: Unsteadiness on feet (R26.81);Dizziness and giddiness (R42)    Time: 1510-1535 PT Time Calculation (min) (ACUTE ONLY): 25 min   Charges:   PT Evaluation $PT Eval Moderate Complexity: 1 Mod PT Treatments $Gait Training: 8-22 mins   PT G Codes:        Ellamae Sia, PT, DPT Acute Rehabilitation Services  Pager: [phone]   Willy Eddy 01/29/2018, 3:55 PM

## 2018-01-29 NOTE — Progress Notes (Signed)
Patient refused bed alarm. Will continue to monitor patient. 

## 2018-01-29 NOTE — Progress Notes (Signed)
PROGRESS NOTE    Krista Mason  NTI:[id number] DOB: 11/22/1935 DOA: 01/28/2018 PCP: Leighton Ruff, MD   Outpatient Specialists:     Brief Narrative:  Krista Mason is a 82 y.o. female with medical history of HTN, [place]-term smoker who quit last year, histoplasmosis, PVCs  s/p ablation 2/18 as they were though to be causing cardiomyopathy, systolic CHF,  rheumatic fever with moderate mitral regurgitation and diet controlled DM.  She developed the flu on March 26.  She took Tylenol only for this.  She was also noted to have a left buttock abscess and was started on doxycycline for 10 days which she has completed.  Today she went for follow-up visit with her PCP. She complained of feeling dyspnea on exertion. She also complained of intermittent chest pain which felt like heaviness and an irregular heartbeat during this chest pain.     Assessment & Plan:   Active Problems:   Uncontrolled hypertension   HLD (hyperlipidemia)   CKD (chronic kidney disease), stage III (HCC)   Moderate mitral regurgitation   Diabetes mellitus with complication (HCC)   Chronic systolic CHF (congestive heart failure) (HCC)   Dyspnea and hypoxia on exertion-nicotine abuse for 50+ years- recent influenza -[place]-term smoker who quit last year  -nebs not helpful per patient -started after the flu -says more than just routine deconditioning-- usually very active on treadmill -no O2 de-saturations -echo ordered -CHF team consult -V/Q negative, CT scan of chest- no acute process  Chest discomfort-- not pain - Monitor on telemetry for arrhythmias-PVCs ablated last year   Chronic  systolic heart failure -Currently compensated-continue Coreg, Lasix, losartan -Most recent echo on 8/18 showed an EF of 40-45% which had improved from 30-35%  Stage 3-4 CKD - stable  DM2 - not on medicaitons- on carb mod diet at home  Esophageal stricture status post dilatation -Continue PPI-she does not complain of  dysphagia       DVT prophylaxis:  Lovenox   Code Status: Full Code   Family Communication:   Disposition Plan:     Consultants:   CHF team    Subjective: SOB with just talking   Objective: Vitals:   01/29/18 0750 01/29/18 0842 01/29/18 0845 01/29/18 1253  BP: (!) 145/59   (!) 162/64  Pulse: 64   61  Resp:    20  Temp:    (!) 97.4 F (36.3 C)  TempSrc:    Oral  SpO2: 100% 99% 99% 100%  Weight:        Intake/Output Summary (Last 24 hours) at 01/29/2018 1324 Last data filed at 01/29/2018 1246 Gross per 24 hour  Intake 240 ml  Output 1600 ml  Net -1360 ml   Filed Weights   01/28/18 1230 01/29/18 0523  Weight: 68.9 kg (152 lb) 68.4 kg (150 lb 14.4 oz)    Examination:  General exam: in bed, NAD, does appear short of breath in routine conversation Respiratory system: diminished, no wheezing Cardiovascular system: rrr Gastrointestinal system: Abdomen is nondistended, soft and nontender. No organomegaly or masses felt. Normal bowel sounds heard. Central nervous system: Alert and oriented. No focal neurological deficits. Extremities: Symmetric 5 x 5 power. Skin: No rashes, lesions or ulcers Psychiatry: Judgement and insight appear normal. Mood & affect appropriate.     Data Reviewed: I have personally reviewed following labs and imaging studies  CBC: Recent Labs  Lab 01/28/18 1301 01/29/18 0414  WBC 6.5 5.5  HGB 11.5* 10.5*  HCT 35.4* 33.0*  MCV  88.1 88.2  PLT 236 353   Basic Metabolic Panel: Recent Labs  Lab 01/28/18 1301 01/29/18 0414  NA 134* 136  K 4.2 3.8  CL 102 102  CO2 23 22  GLUCOSE 111* 87  BUN 33* 30*  CREATININE 1.82* 1.63*  CALCIUM 9.9 9.5   GFR: CrCl cannot be calculated (Unknown ideal weight.). Liver Function Tests: Recent Labs  Lab 01/28/18 1301  AST 17  ALT 9*  ALKPHOS 104  BILITOT 0.6  PROT 6.6  ALBUMIN 3.7   No results for input(s): LIPASE, AMYLASE in the last 168 hours. No results for input(s):  AMMONIA in the last 168 hours. Coagulation Profile: No results for input(s): INR, PROTIME in the last 168 hours. Cardiac Enzymes: No results for input(s): CKTOTAL, CKMB, CKMBINDEX, TROPONINI in the last 168 hours. BNP (last 3 results) No results for input(s): PROBNP in the last 8760 hours. HbA1C: No results for input(s): HGBA1C in the last 72 hours. CBG: Recent Labs  Lab 01/28/18 2202 01/29/18 0828 01/29/18 1145  GLUCAP 158* 122* 123*   Lipid Profile: No results for input(s): CHOL, HDL, LDLCALC, TRIG, CHOLHDL, LDLDIRECT in the last 72 hours. Thyroid Function Tests: No results for input(s): TSH, T4TOTAL, FREET4, T3FREE, THYROIDAB in the last 72 hours. Anemia Panel: No results for input(s): VITAMINB12, FOLATE, FERRITIN, TIBC, IRON, RETICCTPCT in the last 72 hours. Urine analysis:    Component Value Date/Time   COLORURINE STRAW (A) 01/28/2018 1353   APPEARANCEUR CLEAR 01/28/2018 1353   LABSPEC 1.006 01/28/2018 1353   PHURINE 6.0 01/28/2018 1353   GLUCOSEU NEGATIVE 01/28/2018 1353   HGBUR NEGATIVE 01/28/2018 1353   BILIRUBINUR NEGATIVE 01/28/2018 1353   KETONESUR NEGATIVE 01/28/2018 1353   PROTEINUR NEGATIVE 01/28/2018 1353   UROBILINOGEN 0.2 04/15/2012 1451   NITRITE NEGATIVE 01/28/2018 1353   LEUKOCYTESUR NEGATIVE 01/28/2018 1353     )No results found for this or any previous visit (from the past 240 hour(s)).    Anti-infectives (From admission, onward)   None       Radiology Studies: Dg Chest 2 View  Result Date: 01/28/2018 CLINICAL DATA:  Shortness of breath. EXAM: CHEST - 2 VIEW COMPARISON:  01/28/2018, 11/06/2016.  CT 07/11/2016 FINDINGS: Stable mild mediastinal fullness, most likely vascular. Calcified left AP window lymph node noted. Aortic and bilateral subclavian atherosclerotic vascular calcification. Stable cardiomegaly. No pulmonary venous congestion. No focal infiltrate. No pleural effusion or pneumothorax. Stable lower thoracic vertebral body compression  fracture. IMPRESSION: 1. No acute cardiopulmonary disease. Stable calcified left AP window lymph nodes. 2. Stable cardiomegaly. Stable aortic and peripheral vascular disease. Electronically Signed   By: Marcello Moores  Register   On: 01/28/2018 13:45   Ct Chest Wo Contrast  Result Date: 01/28/2018 CLINICAL DATA:  Chest pain and shortness of breath, worse with exertion for 1 week. Ex smoker. History of CHF, hypertension. EXAM: CT CHEST WITHOUT CONTRAST TECHNIQUE: Multidetector CT imaging of the chest was performed following the standard protocol without IV contrast. COMPARISON:  Chest radiograph January 28, 2018 and CT chest July 19, 2016 FINDINGS: CARDIOVASCULAR: Heart is mildly enlarged. Trace pericardial effusion. Severe coronary artery calcifications. Thoracic aorta is normal course and caliber, severe calcific atherosclerosis. MEDIASTINUM/NODES: Coarsely calcified aortopulmonary window lymph nodes. No lymphadenopathy by CT size criteria. Normal appearance of thoracic esophagus though not tailored for evaluation. LUNGS/PLEURA: Tracheobronchial tree is patent, no pneumothorax. Mild centrilobular emphysema with upper lobe predominance. Mild biapical bullous changes. Bibasilar atelectasis/scarring. No focal consolidation or pleural effusion. A few punctate scattered calcified granulomas. UPPER  ABDOMEN: Calcified granulomas in spleen. Multiple renal cysts measuring to 2.5 cm. MUSCULOSKELETAL: Nonacute.  Old mild L1 compression fracture. IMPRESSION: 1. Mild cardiomegaly.  No acute pulmonary process by noncontrast CT. 2. Old granulomatous disease. 3. Aortic Atherosclerosis (ICD10-I70.0) and Emphysema (ICD10-J43.9). Electronically Signed   By: Elon Alas M.D.   On: 01/28/2018 16:42   Nm Pulmonary Vent And Perf (v/q Scan)  Result Date: 01/28/2018 CLINICAL DATA:  Shortness of breath on exertion. EXAM: NUCLEAR MEDICINE VENTILATION - PERFUSION LUNG SCAN TECHNIQUE: Ventilation images were obtained in multiple  projections using inhaled aerosol Tc-64m DTPA. Perfusion images were obtained in multiple projections after intravenous injection of Tc-43m-MAA. RADIOPHARMACEUTICALS:  6 mCi of Tc-21m DTPA aerosol inhalation and 30 mCi Tc75m-MAA IV COMPARISON:  CT chest this same day. FINDINGS: Ventilation: No focal ventilation defect. Perfusion: No wedge shaped peripheral perfusion defects to suggest acute pulmonary embolism. IMPRESSION: Negative for pulmonary embolus. Electronically Signed   By: Inge Rise M.D.   On: 01/28/2018 17:21        Scheduled Meds: . aspirin EC  81 mg Oral Daily  . atorvastatin  20 mg Oral Daily  . carvedilol  6.25 mg Oral BID  . enoxaparin (LOVENOX) injection  40 mg Subcutaneous Q24H  . furosemide  40 mg Oral QODAY  . insulin aspart  0-5 Units Subcutaneous QHS  . insulin aspart  0-9 Units Subcutaneous TID WC  . losartan  50 mg Oral Daily  . pantoprazole  40 mg Oral Daily  . sodium chloride flush  3 mL Intravenous Q12H   Continuous Infusions: . sodium chloride       LOS: 0 days    Time spent: 35 min    Geradine Girt, DO [facility] Pager [phone]  If 7PM-7AM, please contact night-coverage [email or web address] Password [email or web address] 01/29/2018, 1:24 PM

## 2018-01-29 NOTE — Progress Notes (Signed)
Patient is alert oriented up in chair with no distress. Hf team with f/u in hospital. x1 lasix given. walked with PT.

## 2018-01-29 NOTE — Consult Note (Addendum)
Advanced Heart Failure Team Consult Note   Primary Physician: Leighton Ruff, MD PCP-Cardiologist:  No primary care provider on file.  Reason for Consultation: SOB and A/C systolic CHF  HPI:    Niani C Alberto is seen today for evaluation of SOB at the request of Dr. Eliseo Squires.   TEMICA RIGHETTI is a 82 y.o. female hx of Combined systolic diastolic CHF, CAD, HTN, PVCs s/p ablation c/b LBBB, HL, DM2, CKD, histoplasmosis, and tobacco abuse.   Last seen in HF clinic 12/03/2016, Was going well overall at [facility]. Was having occasional palpitations.  30 day event monitor performed with several episodes of "drop attacks/syncope". This was unremarkable.   She has had some worsening of her baseline SOB since 2-3 weeks ago. Recently clinically diagnosed with the flu by her PCP. Symptomatic treatment. She has had occasional chest pressure on exertion when she is SOB. It does not occur every time. She sleeps in a recliner.  Weight at home has gradually increased over the past 2 weeks, but she cannot give a specific amount. She was sent from her PCP to the [facility] yesterday when she complained of CP. Pertinent labs on admission include K 4.2, Creatinine 1.82, BNP 298, Troponing negative, WBC 6.5, Hgb 11.5. UA unremarkable. CT chest stable with old granulomatous disease. CXR without acute changes. VQ scan negative.   She is feeling somewhat better today. She feels like she has been more tired and weak since getting the flu, and is anxious to get back to "working out". Tries to remains active at her independent living facility. Was taking lasix every other day PTA.  She denies fever or chills since getting over flu. She did have a  Recent buttock wound treated with doxycycline. She still has her "drop attacks" every now and then, where she gets lightheaded and feels like she blacks out for a moment. She states she had one this am. They last for 2-3 seconds and they resolve.   Echo 05/2017 LVEF  40-45%, Grade 1 DD, Mild AI, Mod MR, Trivial TI, PA peak pressure 28 mm Hg.   Holter monitory 10/29/2017. No high grade blocks of AF.   Review of Systems: [y] = yes, [ ]  = no   General: Weight gain [y]; Weight loss [ ] ; Anorexia [ ] ; Fatigue [y]; Fever [ ] ; Chills [ ] ; Weakness [ ]   Cardiac: Chest pain/pressure [ ] ; Resting SOB [ ] ; Exertional SOB [y]; Orthopnea [y]; Pedal Edema [ ] ; Palpitations [ ] ; Syncope [ ] ; Presyncope [ ] ; Paroxysmal nocturnal dyspnea[ ]   Pulmonary: Cough [ ] ; Wheezing[ ] ; Hemoptysis[ ] ; Sputum [ ] ; Snoring [ ]   GI: Vomiting[ ] ; Dysphagia[ ] ; Melena[ ] ; Hematochezia [ ] ; Heartburn[ ] ; Abdominal pain [ ] ; Constipation [ ] ; Diarrhea [ ] ; BRBPR [ ]   GU: Hematuria[ ] ; Dysuria [ ] ; Nocturia[ ]   Vascular: Pain in legs with walking [ ] ; Pain in feet with lying flat [ ] ; Non-healing sores [ ] ; Stroke [ ] ; TIA [ ] ; Slurred speech [ ] ;  Neuro: Headaches[ ] ; Vertigo[ ] ; Seizures[ ] ; Paresthesias[ ] ;Blurred vision [ ] ; Diplopia [ ] ; Vision changes [ ]   Ortho/Skin: Arthritis [y]; Joint pain [y]; Muscle pain [ ] ; Joint swelling [ ] ; Back Pain [ ] ; Rash [ ]   Psych: Depression[ ] ; Anxiety[ ]   Heme: Bleeding problems [ ] ; Clotting disorders [ ] ; Anemia [ ]   Endocrine: Diabetes [ ] ; Thyroid dysfunction[ ]   Home Medications Prior to Admission medications  Medication Sig Start Date End Date Taking? Authorizing Provider  acetaminophen (TYLENOL) 325 MG tablet Take 325 mg by mouth every 6 (six) hours as needed for mild pain.    Yes [provider]  aspirin 81 MG tablet Take 1 tablet (81 mg total) by mouth daily. 12/31/16  Yes Bensimhon, Shaune Pascal, MD  atorvastatin (LIPITOR) 20 MG tablet TAKE 1 TABLET BY MOUTH DAILY Patient taking differently: Take 40 mg by mouth at bedtime 05/17/17  Yes Camnitz, Will Hassell Done, MD  carvedilol (COREG) 6.25 MG tablet Take 1 tablet (6.25 mg total) by mouth 2 (two) times daily. 04/09/17  Yes Camnitz, Ocie Doyne, MD  cholecalciferol (VITAMIN D) 1000 UNITS  tablet Take 1,000 Units by mouth daily.    Yes [provider]  ferrous gluconate (FERGON) 324 MG tablet Take 324 mg by mouth every other day.   Yes [provider]  furosemide (LASIX) 40 MG tablet Take 1 tablet (40 mg total) by mouth every other day. 10/04/17  Yes Bensimhon, Shaune Pascal, MD  losartan (COZAAR) 50 MG tablet Take 1 tablet (50 mg total) by mouth daily. 12/10/16  Yes [name], PA-C  omeprazole (PRILOSEC) 40 MG capsule Take 40 mg by mouth daily.    Yes [provider]  Polyethyl Glycol-Propyl Glycol (SYSTANE OP) Place 1 drop into both eyes 2 (two) times daily as needed (dryness).   Yes [provider]    Past Medical History: Past Medical History:  Diagnosis Date  . Atrial fibrillation ([place])   . Chronic combined systolic and diastolic CHF (congestive heart failure) ([place])    a. LV dysfcuntion felt to be related to PVCs  . Chronic kidney disease   . Diabetes mellitus without complication ([place])   . Hx of echocardiogram    Echo (1/16):  EF 60-65%, no RWMA, Gr 1 DD, mod AI, mod MR, mild LAE  . Hyperkalemia   . Hyperlipidemia   . Hypertension   . Orthostatic hypotension   . PVC (premature ventricular contraction)    a. s/p PVC ablation in 11/2016  . Rheumatic fever   . Sleep apnea     Past Surgical History: Past Surgical History:  Procedure Laterality Date  . KNEE SURGERY Left 2009  . OVARIAN CYST REMOVAL    . PVC ABLATION N/A 11/30/2016   Procedure: PVC Ablation;  Surgeon: Will [name], MD;  Location: [place] [facility];  Service: Cardiovascular;  Laterality: N/A;  . RIGHT/LEFT HEART CATH AND CORONARY ANGIOGRAPHY N/A 01/25/2017   Procedure: Right/Left Heart Cath and Coronary Angiography;  Surgeon: Jolaine Artist, MD;  Location: [place] [facility];  Service: Cardiovascular;  Laterality: N/A;    Family History: Family History  Problem Relation Age of Onset  . Heart attack Father   . Heart disease Father   . Cancer  Father   . Hypertension Father   . Hypertension Brother   . Stroke Brother   . Hypertension Brother   . Diabetes Son   . Hyperlipidemia Son   . Hypertension Son     Social History: Social History   Socioeconomic History  . Marital status: Widowed    Spouse name: Not on file  . Number of children: Not on file  . Years of education: Not on file  . Highest education level: Not on file  Occupational History  . Not on file  Social Needs  . Financial resource strain: Not on file  . Food insecurity:    Worry: Not on  file    Inability: Not on file  . Transportation needs:    Medical: Not on file    Non-medical: Not on file  Tobacco Use  . Smoking status: Former Smoker    Packs/day: 0.75    Years: 60.00    Pack years: 45.00    Last attempt to quit: 11/04/2016    Years since quitting: 1.2  . Smokeless tobacco: Never Used  Substance and Sexual Activity  . Alcohol use: No  . Drug use: No  . Sexual activity: Not on file  Lifestyle  . Physical activity:    Days per week: Not on file    Minutes per session: Not on file  . Stress: Not on file  Relationships  . Social connections:    Talks on phone: Not on file    Gets together: Not on file    Attends religious service: Not on file    Active member of club or organization: Not on file    Attends meetings of clubs or organizations: Not on file    Relationship status: Not on file  Other Topics Concern  . Not on file  Social History Narrative  . Not on file    Allergies:  Allergies  Allergen Reactions  . Penicillins Itching and Rash    Amoxicillin ok- IM pen is what gives reaction  Has patient had a PCN reaction causing immediate rash, facial/tongue/throat swelling, SOB or lightheadedness with hypotension: Yes Has patient had a PCN reaction causing severe rash involving mucus membranes or skin necrosis: No Has patient had a PCN reaction that required hospitalization: No Has patient had a PCN reaction occurring within  the last 10 years: No If all of the above answers are "NO", then may proceed with Cephalosporin use.  [name] [Cefaclor] Itching  . Crestor [Rosuvastatin Calcium] Nausea Only  . Epinephrine Other (See Comments)    "feels like heart is beating out of chest"  . Nsaids     Pt states she is not taking because of kidney function  . Other Other (See Comments)    FLU SHOT-swelling, redness, fever  . Percocet [Oxycodone-Acetaminophen] Nausea And Vomiting  . Prednisone     Dizziness, spiked blood sugar    Objective:    Vital Signs:   Temp:  [97.4 F (36.3 C)-98.2 F (36.8 C)] 97.4 F (36.3 C) (04/10 1253) Pulse Rate:  [57-82] 61 (04/10 1253) Resp:  [14-21] 20 (04/10 1253) BP: (128-179)/(46-101) 162/64 (04/10 1253) SpO2:  [96 %-100 %] 100 % (04/10 1253) Weight:  [150 lb 14.4 oz (68.4 kg)] 150 lb 14.4 oz (68.4 kg) (04/10 0523) Last BM Date: 01/27/18  Weight change: Filed Weights   01/28/18 1230 01/29/18 0523  Weight: 152 lb (68.9 kg) 150 lb 14.4 oz (68.4 kg)    Intake/Output:   Intake/Output Summary (Last 24 hours) at 01/29/2018 1316 Last data filed at 01/29/2018 1246 Gross per 24 hour  Intake 240 ml  Output 1600 ml  Net -1360 ml      Physical Exam    General:  Elderly appearing. No resp difficulty HEENT: normal Neck: supple. JVP 6-7 cm. Carotids 2+ bilat; no bruits. No lymphadenopathy or thyromegaly appreciated. Cor: PMI nondisplaced. Regular rate & rhythm. No rubs, gallops or murmurs. Lungs: clear Abdomen: soft, nontender, nondistended. No hepatosplenomegaly. No bruits or masses. Good bowel sounds. Extremities: no cyanosis, clubbing, rash, or edema Neuro: alert & orientedx3, cranial nerves grossly intact. moves all 4 extremities w/o difficulty. Affect pleasant  Telemetry  NSR with 1st degree AV block, Personally reviewed  EKG    NSR, LBBB 01/28/18, personally reviewed.  Labs   Basic Metabolic Panel: Recent Labs  Lab 01/28/18 1301 01/29/18 0414  NA 134* 136   K 4.2 3.8  CL 102 102  CO2 23 22  GLUCOSE 111* 87  BUN 33* 30*  CREATININE 1.82* 1.63*  CALCIUM 9.9 9.5    Liver Function Tests: Recent Labs  Lab 01/28/18 1301  AST 17  ALT 9*  ALKPHOS 104  BILITOT 0.6  PROT 6.6  ALBUMIN 3.7   No results for input(s): LIPASE, AMYLASE in the last 168 hours. No results for input(s): AMMONIA in the last 168 hours.  CBC: Recent Labs  Lab 01/28/18 1301 01/29/18 0414  WBC 6.5 5.5  HGB 11.5* 10.5*  HCT 35.4* 33.0*  MCV 88.1 88.2  PLT 236 204    Cardiac Enzymes: No results for input(s): CKTOTAL, CKMB, CKMBINDEX, TROPONINI in the last 168 hours.  BNP: BNP (last 3 results) Recent Labs    01/28/18 1302  BNP 298.5*    ProBNP (last 3 results) No results for input(s): PROBNP in the last 8760 hours.   CBG: Recent Labs  Lab 01/28/18 2202 01/29/18 0828 01/29/18 1145  GLUCAP 158* 122* 123*    Coagulation Studies: No results for input(s): LABPROT, INR in the last 72 hours.   Imaging   Dg Chest 2 View  Result Date: 01/28/2018 CLINICAL DATA:  Shortness of breath. EXAM: CHEST - 2 VIEW COMPARISON:  01/28/2018, 11/06/2016.  CT 07/11/2016 FINDINGS: Stable mild mediastinal fullness, most likely vascular. Calcified left AP window lymph node noted. Aortic and bilateral subclavian atherosclerotic vascular calcification. Stable cardiomegaly. No pulmonary venous congestion. No focal infiltrate. No pleural effusion or pneumothorax. Stable lower thoracic vertebral body compression fracture. IMPRESSION: 1. No acute cardiopulmonary disease. Stable calcified left AP window lymph nodes. 2. Stable cardiomegaly. Stable aortic and peripheral vascular disease. Electronically Signed   By: Marcello Moores  Register   On: 01/28/2018 13:45   Ct Chest Wo Contrast  Result Date: 01/28/2018 CLINICAL DATA:  Chest pain and shortness of breath, worse with exertion for 1 week. Ex smoker. History of CHF, hypertension. EXAM: CT CHEST WITHOUT CONTRAST TECHNIQUE: Multidetector  CT imaging of the chest was performed following the standard protocol without IV contrast. COMPARISON:  Chest radiograph January 28, 2018 and CT chest July 19, 2016 FINDINGS: CARDIOVASCULAR: Heart is mildly enlarged. Trace pericardial effusion. Severe coronary artery calcifications. Thoracic aorta is normal course and caliber, severe calcific atherosclerosis. MEDIASTINUM/NODES: Coarsely calcified aortopulmonary window lymph nodes. No lymphadenopathy by CT size criteria. Normal appearance of thoracic esophagus though not tailored for evaluation. LUNGS/PLEURA: Tracheobronchial tree is patent, no pneumothorax. Mild centrilobular emphysema with upper lobe predominance. Mild biapical bullous changes. Bibasilar atelectasis/scarring. No focal consolidation or pleural effusion. A few punctate scattered calcified granulomas. UPPER ABDOMEN: Calcified granulomas in spleen. Multiple renal cysts measuring to 2.5 cm. MUSCULOSKELETAL: Nonacute.  Old mild L1 compression fracture. IMPRESSION: 1. Mild cardiomegaly.  No acute pulmonary process by noncontrast CT. 2. Old granulomatous disease. 3. Aortic Atherosclerosis (ICD10-I70.0) and Emphysema (ICD10-J43.9). Electronically Signed   By: Elon Alas M.D.   On: 01/28/2018 16:42   Nm Pulmonary Vent And Perf (v/q Scan)  Result Date: 01/28/2018 CLINICAL DATA:  Shortness of breath on exertion. EXAM: NUCLEAR MEDICINE VENTILATION - PERFUSION LUNG SCAN TECHNIQUE: Ventilation images were obtained in multiple projections using inhaled aerosol Tc-27m DTPA. Perfusion images were obtained in multiple projections after intravenous injection of Tc-72m-MAA. RADIOPHARMACEUTICALS:  6 mCi of Tc-41m DTPA aerosol inhalation and 30 mCi Tc48m-MAA IV COMPARISON:  CT chest this same day. FINDINGS: Ventilation: No focal ventilation defect. Perfusion: No wedge shaped peripheral perfusion defects to suggest acute pulmonary embolism. IMPRESSION: Negative for pulmonary embolus. Electronically Signed    By: Inge Rise M.D.   On: 01/28/2018 17:21      Medications:     Current Medications: . aspirin EC  81 mg Oral Daily  . atorvastatin  20 mg Oral Daily  . carvedilol  6.25 mg Oral BID  . enoxaparin (LOVENOX) injection  40 mg Subcutaneous Q24H  . furosemide  40 mg Oral QODAY  . insulin aspart  0-5 Units Subcutaneous QHS  . insulin aspart  0-9 Units Subcutaneous TID WC  . losartan  50 mg Oral Daily  . pantoprazole  40 mg Oral Daily  . sodium chloride flush  3 mL Intravenous Q12H     Infusions: . sodium chloride         Patient Profile   Caddie Randle Sabala is a 82 y.o. female with hx of Combined systolic diastolic CHF, CAD, HTN, PVCs, HL, DM2, CKD, histoplasmosis, and tobacco abuse.   Admitted 01/28/18 with chest pain and worsening SOB.   Assessment/Plan    1. Chest pain - Mixed history. Suspect this was in setting of volume overload.  - Troponin negative.  - Last cath 01/2017 with heavily calcified but non-obstructive CAD. 50% RCA and 30% LAD.  - CT chest stable with old granulomatous disease. CXR without acute changes. VQ scan negative.   2. Acute on chronic combined CHF: NICM s/p PVC ablation. Now with LBBB. Echo 8/18 EF 40% (unchanged from prior).  - Echo today with LVEF 35-40%, Mod/Sev MR, LVH - NYHA III leading up to admit. Chronic orthopnea sleeps in recliner.  - Volume status looks near normal.  - Will give 40 mg IV lasix x one and start po lasix tomorrow.  - Continue Coreg 6.25 mg BID.  - Continue losartan 50 mg daily. Have previously considered switching her to [facility] but BP has been too low. Now running high.  -Previously not candidate for spiro with High normal K at baseline (4.6-4.8) - With LVH, IVCD, NICM, and recent history of orthostasis will order myeloma panel and PYP scan to look for cardiac amyloidosis.   3. PVCs - s/p ablation. Off Amiodarone therapy. PVC burden 2.1% 01/2017   4. LBBB - Echo as above. - If worsening cardiomyopathy, could  consider CRT-D in the future.   5. Drop attacks/syncope - Continues to have on occasion. 30 day monitor unremarkable 10/2017. ? orthostasis in setting of possible infiltrative cardiomyopathy.   6. CKD III - Baseline creatinine 1.5-1.6.  - 1.8 on admit, down to 1.6 with diuresis.   7. Chronic hypoxic respiratory failure.  - Hypoxic on arrival, but now improved.  - CT chest with evidence of Emphysema. - PFTs 12/2016 with Mod/Severe reduction in DLCO (14.13 / 55%)  Medication concerns reviewed with patient and pharmacy team. Barriers identified: None at this time.  Length of Stay: 0  [name], PA-C  01/29/2018, 1:16 PM  Advanced Heart Failure Team Pager [phone] (M-F; 7a - 4p)  Please contact [facility] for night-coverage after hours (4p -7a ) and weekends on [email or web address]  Patient seen and examined with the above-signed Advanced Practice Provider and/or Housestaff. I personally reviewed laboratory data, imaging studies and relevant notes. I independently examined the patient and formulated the important aspects of the plan.  I have edited the note to reflect any of my changes or salient points. I have personally discussed the plan with the patient and/or family.  82 y/o wit multiple medical problems well known to me from HF Clinic. She has h/o NICM with EF 40-45%. Was having frequent PVCs and underwent PVC ablation c/b LBBB.   Recently had the flu. Since that time has had severe SOB and DOE. Also sleeping in recliner due to orthopnea. PCP suggested she come to ER. W/u in ER relatively unrevealing. CXR ok. BNP close to baseline. Did receive 1 dose of IV lasix with good urine output but she says she is still SOB  On examine, I do not see any evidence of volume overload at this point. Lungs are clear. No edema.   Echo reviewed personally and d/w Dr. Meda Coffee. EF 35-40% with LBBB. Moderate LVH with trivial posterior effusion concerning for cardiac amyloid.   At this point I am  not sure why she is so SOB still. Will give one more dose of IV lasix. Have PT see. Will plan [employer] PYP scan tomorrow to evaluate for TTR amyloid. Also check myeloma panel. We will follow. LBBB could be contributing to symptoms but this has been stable for some time.   Glori Bickers, MD  9:26 PM

## 2018-01-29 NOTE — Progress Notes (Signed)
  Echocardiogram 2D Echocardiogram has been performed.  Jennette Dubin 01/29/2018, 1:40 PM

## 2018-01-29 NOTE — Progress Notes (Signed)
Patient is alert and oriented with SOB, with exertion and has a appt with HF team Friday April 12th.

## 2018-01-30 ENCOUNTER — Observation Stay (HOSPITAL_COMMUNITY): Payer: Medicare HMO

## 2018-01-30 DIAGNOSIS — N183 Chronic kidney disease, stage 3 (moderate): Secondary | ICD-10-CM | POA: Diagnosis not present

## 2018-01-30 DIAGNOSIS — R0602 Shortness of breath: Secondary | ICD-10-CM | POA: Diagnosis not present

## 2018-01-30 DIAGNOSIS — R0902 Hypoxemia: Secondary | ICD-10-CM | POA: Diagnosis not present

## 2018-01-30 DIAGNOSIS — I5023 Acute on chronic systolic (congestive) heart failure: Secondary | ICD-10-CM | POA: Diagnosis not present

## 2018-01-30 DIAGNOSIS — I509 Heart failure, unspecified: Secondary | ICD-10-CM | POA: Diagnosis not present

## 2018-01-30 DIAGNOSIS — R0600 Dyspnea, unspecified: Secondary | ICD-10-CM | POA: Diagnosis not present

## 2018-01-30 DIAGNOSIS — I5022 Chronic systolic (congestive) heart failure: Secondary | ICD-10-CM | POA: Diagnosis not present

## 2018-01-30 DIAGNOSIS — E118 Type 2 diabetes mellitus with unspecified complications: Secondary | ICD-10-CM | POA: Diagnosis not present

## 2018-01-30 LAB — GLUCOSE, CAPILLARY
GLUCOSE-CAPILLARY: 128 mg/dL — AB (ref 65–99)
GLUCOSE-CAPILLARY: 118 mg/dL — AB (ref 65–99)
Glucose-Capillary: 139 mg/dL — ABNORMAL HIGH (ref 65–99)
Glucose-Capillary: 145 mg/dL — ABNORMAL HIGH (ref 65–99)

## 2018-01-30 LAB — CBC
HEMATOCRIT: 33.5 % — AB (ref 36.0–46.0)
HEMOGLOBIN: 10.9 g/dL — AB (ref 12.0–15.0)
MCH: 28.9 pg (ref 26.0–34.0)
MCHC: 32.5 g/dL (ref 30.0–36.0)
MCV: 88.9 fL (ref 78.0–100.0)
Platelets: 205 10*3/uL (ref 150–400)
RBC: 3.77 MIL/uL — AB (ref 3.87–5.11)
RDW: 14.8 % (ref 11.5–15.5)
WBC: 5 10*3/uL (ref 4.0–10.5)

## 2018-01-30 LAB — BASIC METABOLIC PANEL
Anion gap: 11 (ref 5–15)
BUN: 33 mg/dL — AB (ref 6–20)
CO2: 24 mmol/L (ref 22–32)
Calcium: 9.5 mg/dL (ref 8.9–10.3)
Chloride: 101 mmol/L (ref 101–111)
Creatinine, Ser: 2.12 mg/dL — ABNORMAL HIGH (ref 0.44–1.00)
GFR calc Af Amer: 24 mL/min — ABNORMAL LOW (ref >60)
GFR calc non Af Amer: 21 mL/min — ABNORMAL LOW (ref >60)
Glucose, Bld: 122 mg/dL — ABNORMAL HIGH (ref 65–99)
POTASSIUM: 3.8 mmol/L (ref 3.5–5.1)
SODIUM: 136 mmol/L (ref 135–145)

## 2018-01-30 MED ORDER — SODIUM CHLORIDE 0.9% FLUSH
3.0000 mL | Freq: Two times a day (BID) | INTRAVENOUS | Status: DC
  Administered 2018-01-30: 3 mL via INTRAVENOUS

## 2018-01-30 MED ORDER — TECHNETIUM TC 99M PYROPHOSPHATE
20.0000 | Freq: Once | INTRAVENOUS | Status: AC
  Administered 2018-01-30: 20 via INTRAVENOUS
  Filled 2018-01-30: qty 20

## 2018-01-30 MED ORDER — ENOXAPARIN SODIUM 30 MG/0.3ML ~~LOC~~ SOLN
30.0000 mg | SUBCUTANEOUS | Status: DC
  Administered 2018-01-30: 30 mg via SUBCUTANEOUS
  Filled 2018-01-30: qty 0.3

## 2018-01-30 MED ORDER — SODIUM CHLORIDE 0.9 % IV SOLN: 250.0000 mL | INTRAVENOUS | Status: DC | PRN

## 2018-01-30 MED ORDER — SODIUM CHLORIDE 0.9 % IV SOLN
INTRAVENOUS | Status: DC
  Administered 2018-01-31: 06:00:00 via INTRAVENOUS

## 2018-01-30 MED ORDER — SODIUM CHLORIDE 0.9% FLUSH: 3.0000 mL | INTRAVENOUS | Status: DC | PRN

## 2018-01-30 NOTE — H&P (View-Only) (Signed)
Advanced Heart Failure Rounding Note  PCP-Cardiologist: No primary care provider on file.   Subjective:    Plan for PYP scan today. Myeloma panel pending.   Received another dose of IV lasix yesterday. Creatinine trending up. 1.6>2.1   Still feels winded with any activity. Says she is even SOB at rest. Walked down the hall and sats 98% on RA   Objective:   Weight Range: 149 lb 14.4 oz (68 kg) Body mass index is 24.94 kg/m.   Vital Signs:   Temp:  [97.4 F (36.3 C)-98.1 F (36.7 C)] 97.8 F (36.6 C) (04/11 0438) Pulse Rate:  [56-65] 56 (04/11 0438) Resp:  [18-20] 18 (04/11 0438) BP: (125-162)/(49-101) 129/49 (04/11 0438) SpO2:  [95 %-100 %] 95 % (04/11 0438) Weight:  [149 lb 14.4 oz (68 kg)] 149 lb 14.4 oz (68 kg) (04/11 0438) Last BM Date: 01/29/18  Weight change: Filed Weights   01/28/18 1230 01/29/18 0523 01/30/18 0438  Weight: 152 lb (68.9 kg) 150 lb 14.4 oz (68.4 kg) 149 lb 14.4 oz (68 kg)    Intake/Output:   Intake/Output Summary (Last 24 hours) at 01/30/2018 0743 Last data filed at 01/30/2018 0015 Gross per 24 hour  Intake 822 ml  Output 2550 ml  Net -1728 ml      Physical Exam    General:  Tearful.  No resp difficulty HEENT: Normal anicteric Neck: Supple. JVP flat. Carotids 2+ bilat; no bruits. No lymphadenopathy or thyromegaly appreciated. Cor: PMI nondisplaced. Regular rate & rhythm. No rubs, gallops or murmurs. Lungs: Clear Abdomen: Soft, nontender, nondistended. No hepatosplenomegaly. No bruits or masses. Good bowel sounds. Extremities: No cyanosis, clubbing, rash, edema Neuro: Alert & orientedx3, cranial nerves grossly intact. moves all 4 extremities w/o difficulty. Affect pleasant   Telemetry   NSR 60 personally reviewed.   EKG    N/A   Labs    CBC Recent Labs    01/29/18 0414 01/30/18 0505  WBC 5.5 5.0  HGB 10.5* 10.9*  HCT 33.0* 33.5*  MCV 88.2 88.9  PLT 204 500   Basic Metabolic Panel Recent Labs    01/29/18 0414  01/30/18 0505  NA 136 136  K 3.8 3.8  CL 102 101  CO2 22 24  GLUCOSE 87 122*  BUN 30* 33*  CREATININE 1.63* 2.12*  CALCIUM 9.5 9.5   Liver Function Tests Recent Labs    01/28/18 1301  AST 17  ALT 9*  ALKPHOS 104  BILITOT 0.6  PROT 6.6  ALBUMIN 3.7   No results for input(s): LIPASE, AMYLASE in the last 72 hours. Cardiac Enzymes Recent Labs    01/29/18 1248  TROPONINI <0.03    BNP: BNP (last 3 results) Recent Labs    01/28/18 1302  BNP 298.5*    ProBNP (last 3 results) No results for input(s): PROBNP in the last 8760 hours.   D-Dimer No results for input(s): DDIMER in the last 72 hours. Hemoglobin A1C No results for input(s): HGBA1C in the last 72 hours. Fasting Lipid Panel No results for input(s): CHOL, HDL, LDLCALC, TRIG, CHOLHDL, LDLDIRECT in the last 72 hours. Thyroid Function Tests No results for input(s): TSH, T4TOTAL, T3FREE, THYROIDAB in the last 72 hours.  Invalid input(s): FREET3  Other results:   Imaging     No results found.   Medications:     Scheduled Medications: . aspirin EC  81 mg Oral Daily  . atorvastatin  20 mg Oral Daily  . carvedilol  6.25 mg Oral  BID  . enoxaparin (LOVENOX) injection  40 mg Subcutaneous Q24H  . furosemide  40 mg Oral QODAY  . insulin aspart  0-5 Units Subcutaneous QHS  . insulin aspart  0-9 Units Subcutaneous TID WC  . losartan  50 mg Oral Daily  . pantoprazole  40 mg Oral Daily  . sodium chloride flush  3 mL Intravenous Q12H     Infusions: . sodium chloride       PRN Medications:  sodium chloride, acetaminophen, hydroxypropyl methylcellulose / hypromellose, levalbuterol, ondansetron **OR** ondansetron (ZOFRAN) IV, sodium chloride flush    Patient Profile   Krista Mason is a 82 y.o. female with hx of Combined systolic diastolic CHF, CAD, HTN, PVCs, HL, DM2, CKD, histoplasmosis, and tobacco abuse.   Admitted 01/28/18 with chest pain and worsening SOB.     Assessment/Plan   1.  Chest pain - Mixed history. Suspect this was in setting of volume overload.  - Troponin negative.  - Last cath 01/2017 with heavily calcified but non-obstructive CAD. 50% RCA and 30% LAD.  - CT chest stable with old granulomatous disease. CXR without acute changes. VQ scan negative.   2. Acute on chronic combined CHF: NICM s/p PVC ablation. Now with LBBB. Echo8/18EF 40% (unchanged from prior).  - Echo today with LVEF 35-40%, Mod/Sev MR, LVH -Appears dry. Creatinine up from 1.6>2.1. Hold lasix today.  - Continue Coreg 6.25 mg BID.  - Continue losartan 50 mg daily. Creatinine elevated. So will not increase or add spiro.  -Previously not candidate for spiro with High normal K at baseline (4.6-4.8) - With LVH, IVCD, NICM, and recent history of orthostasis will order myeloma panel and PYP scan to look for cardiac amyloidosis. PYP Scan today.   3. PVCs - s/p ablation. Off Amiodarone therapy. PVC burden 2.1% 01/2017   4. LBBB - Echo as above. - If worsening cardiomyopathy, could consider CRT-D in the future.   5. Drop attacks/syncope - Continues to have on occasion. 30 day monitor unremarkable 10/2017. ? orthostasis in setting of possible infiltrative cardiomyopathy.  - May need LINQ  6.CKD III - Baseline creatinine 1.5-1.6.  - 1.6>2.1 Holding diuresis.    7. Chronic hypoxic respiratory failure.  - Hypoxic on arrival, but now improved.  - CT chest with evidence of Emphysema. - PFTs 12/2016 with Mod/Severe reduction in DLCO (14.13 / 55%)  Consult cardiac rehab.    Medication concerns reviewed with patient and pharmacy team. Barriers identified: none   Length of Stay: 0  Amy Clegg, NP  01/30/2018, 7:43 AM  Advanced Heart Failure Team Pager [phone] (M-F; 7a - 4p)  Please contact [facility] for night-coverage after hours (4p -7a ) and weekends on [email or web address]  Patient seen and examined with Darrick Grinder, NP. We discussed all aspects of the encounter. I agree with the  assessment and plan as stated above.   She continues to complain about severe dyspnea but her symptoms seem out of proportion to objective data. CT and VQ reviewed personally. We have pushed her diuresis hard to see if removing fluid. Creatinine has now bumped so no room to push farther. Will hold lasix today. She walked down the hall and sats 98% per her report. Plan RHC tomorrow to further elucidate but I doubt we will find much that will change our management,.  Glori Bickers, MD  6:38 PM

## 2018-01-30 NOTE — Plan of Care (Signed)
  Problem: Nutrition: Goal: Adequate nutrition will be maintained Outcome: Completed/Met   Problem: Coping: Goal: Level of anxiety will decrease Outcome: Completed/Met   Problem: Elimination: Goal: Will not experience complications related to bowel motility Outcome: Completed/Met   Problem: Pain Managment: Goal: General experience of comfort will improve Outcome: Completed/Met   Problem: Safety: Goal: Ability to remain free from injury will improve Outcome: Completed/Met   Problem: Skin Integrity: Goal: Risk for impaired skin integrity will decrease Outcome: Completed/Met

## 2018-01-30 NOTE — Progress Notes (Signed)
Responded to [facility] to support patient that was having some anxiety over her condition. She said she was just having a pity party because of all the things that were going in her life all at one time.  She said she was much better today and that her emotional strength is back and she is okay.  Patient requested prayers that the doctors can find out what going on with her.  She indicated that she is schedule for more test and looking for good outcomes.  Chaplain will follow as needed.  Jaclynn Major, [place], [facility], Pager [phone]

## 2018-01-30 NOTE — Progress Notes (Addendum)
Advanced Heart Failure Rounding Note  PCP-Cardiologist: No primary care provider on file.   Subjective:    Plan for PYP scan today. Myeloma panel pending.   Received another dose of IV lasix yesterday. Creatinine trending up. 1.6>2.1   Still feels winded with any activity. Says she is even SOB at rest. Walked down the hall and sats 98% on RA   Objective:   Weight Range: 149 lb 14.4 oz (68 kg) Body mass index is 24.94 kg/m.   Vital Signs:   Temp:  [97.4 F (36.3 C)-98.1 F (36.7 C)] 97.8 F (36.6 C) (04/11 0438) Pulse Rate:  [56-65] 56 (04/11 0438) Resp:  [18-20] 18 (04/11 0438) BP: (125-162)/(49-101) 129/49 (04/11 0438) SpO2:  [95 %-100 %] 95 % (04/11 0438) Weight:  [149 lb 14.4 oz (68 kg)] 149 lb 14.4 oz (68 kg) (04/11 0438) Last BM Date: 01/29/18  Weight change: Filed Weights   01/28/18 1230 01/29/18 0523 01/30/18 0438  Weight: 152 lb (68.9 kg) 150 lb 14.4 oz (68.4 kg) 149 lb 14.4 oz (68 kg)    Intake/Output:   Intake/Output Summary (Last 24 hours) at 01/30/2018 0743 Last data filed at 01/30/2018 0015 Gross per 24 hour  Intake 822 ml  Output 2550 ml  Net -1728 ml      Physical Exam    General:  Tearful.  No resp difficulty HEENT: Normal anicteric Neck: Supple. JVP flat. Carotids 2+ bilat; no bruits. No lymphadenopathy or thyromegaly appreciated. Cor: PMI nondisplaced. Regular rate & rhythm. No rubs, gallops or murmurs. Lungs: Clear Abdomen: Soft, nontender, nondistended. No hepatosplenomegaly. No bruits or masses. Good bowel sounds. Extremities: No cyanosis, clubbing, rash, edema Neuro: Alert & orientedx3, cranial nerves grossly intact. moves all 4 extremities w/o difficulty. Affect pleasant   Telemetry   NSR 60 personally reviewed.   EKG    N/A   Labs    CBC Recent Labs    01/29/18 0414 01/30/18 0505  WBC 5.5 5.0  HGB 10.5* 10.9*  HCT 33.0* 33.5*  MCV 88.2 88.9  PLT 204 662   Basic Metabolic Panel Recent Labs    01/29/18 0414  01/30/18 0505  NA 136 136  K 3.8 3.8  CL 102 101  CO2 22 24  GLUCOSE 87 122*  BUN 30* 33*  CREATININE 1.63* 2.12*  CALCIUM 9.5 9.5   Liver Function Tests Recent Labs    01/28/18 1301  AST 17  ALT 9*  ALKPHOS 104  BILITOT 0.6  PROT 6.6  ALBUMIN 3.7   No results for input(s): LIPASE, AMYLASE in the last 72 hours. Cardiac Enzymes Recent Labs    01/29/18 1248  TROPONINI <0.03    BNP: BNP (last 3 results) Recent Labs    01/28/18 1302  BNP 298.5*    ProBNP (last 3 results) No results for input(s): PROBNP in the last 8760 hours.   D-Dimer No results for input(s): DDIMER in the last 72 hours. Hemoglobin A1C No results for input(s): HGBA1C in the last 72 hours. Fasting Lipid Panel No results for input(s): CHOL, HDL, LDLCALC, TRIG, CHOLHDL, LDLDIRECT in the last 72 hours. Thyroid Function Tests No results for input(s): TSH, T4TOTAL, T3FREE, THYROIDAB in the last 72 hours.  Invalid input(s): FREET3  Other results:   Imaging     No results found.   Medications:     Scheduled Medications: . aspirin EC  81 mg Oral Daily  . atorvastatin  20 mg Oral Daily  . carvedilol  6.25 mg Oral  BID  . enoxaparin (LOVENOX) injection  40 mg Subcutaneous Q24H  . furosemide  40 mg Oral QODAY  . insulin aspart  0-5 Units Subcutaneous QHS  . insulin aspart  0-9 Units Subcutaneous TID WC  . losartan  50 mg Oral Daily  . pantoprazole  40 mg Oral Daily  . sodium chloride flush  3 mL Intravenous Q12H     Infusions: . sodium chloride       PRN Medications:  sodium chloride, acetaminophen, hydroxypropyl methylcellulose / hypromellose, levalbuterol, ondansetron **OR** ondansetron (ZOFRAN) IV, sodium chloride flush    Patient Profile   Krista Mason is a 82 y.o. female with hx of Combined systolic diastolic CHF, CAD, HTN, PVCs, HL, DM2, CKD, histoplasmosis, and tobacco abuse.   Admitted 01/28/18 with chest pain and worsening SOB.     Assessment/Plan   1.  Chest pain - Mixed history. Suspect this was in setting of volume overload.  - Troponin negative.  - Last cath 01/2017 with heavily calcified but non-obstructive CAD. 50% RCA and 30% LAD.  - CT chest stable with old granulomatous disease. CXR without acute changes. VQ scan negative.   2. Acute on chronic combined CHF: NICM s/p PVC ablation. Now with LBBB. Echo8/18EF 40% (unchanged from prior).  - Echo today with LVEF 35-40%, Mod/Sev MR, LVH -Appears dry. Creatinine up from 1.6>2.1. Hold lasix today.  - Continue Coreg 6.25 mg BID.  - Continue losartan 50 mg daily. Creatinine elevated. So will not increase or add spiro.  -Previously not candidate for spiro with High normal K at baseline (4.6-4.8) - With LVH, IVCD, NICM, and recent history of orthostasis will order myeloma panel and PYP scan to look for cardiac amyloidosis. PYP Scan today.   3. PVCs - s/p ablation. Off Amiodarone therapy. PVC burden 2.1% 01/2017   4. LBBB - Echo as above. - If worsening cardiomyopathy, could consider CRT-D in the future.   5. Drop attacks/syncope - Continues to have on occasion. 30 day monitor unremarkable 10/2017. ? orthostasis in setting of possible infiltrative cardiomyopathy.  - May need LINQ  6.CKD III - Baseline creatinine 1.5-1.6.  - 1.6>2.1 Holding diuresis.    7. Chronic hypoxic respiratory failure.  - Hypoxic on arrival, but now improved.  - CT chest with evidence of Emphysema. - PFTs 12/2016 with Mod/Severe reduction in DLCO (14.13 / 55%)  Consult cardiac rehab.    Medication concerns reviewed with patient and pharmacy team. Barriers identified: none   Length of Stay: 0  Amy Clegg, NP  01/30/2018, 7:43 AM  Advanced Heart Failure Team Pager [phone] (M-F; 7a - 4p)  Please contact [facility] for night-coverage after hours (4p -7a ) and weekends on [email or web address]  Patient seen and examined with Darrick Grinder, NP. We discussed all aspects of the encounter. I agree with the  assessment and plan as stated above.   She continues to complain about severe dyspnea but her symptoms seem out of proportion to objective data. CT and VQ reviewed personally. We have pushed her diuresis hard to see if removing fluid. Creatinine has now bumped so no room to push farther. Will hold lasix today. She walked down the hall and sats 98% per her report. Plan RHC tomorrow to further elucidate but I doubt we will find much that will change our management,.  Glori Bickers, MD  6:38 PM

## 2018-01-30 NOTE — Progress Notes (Signed)
PROGRESS NOTE    Krista Mason  EXH:[id number] DOB: 07-Feb-1936 DOA: 01/28/2018 PCP: Leighton Ruff, MD   Outpatient Specialists:     Brief Narrative:  Krista Mason is a 82 y.o. female with medical history of HTN, [place]-term smoker who quit last year, histoplasmosis, PVCs  s/p ablation 2/18 as they were though to be causing cardiomyopathy, systolic CHF,  rheumatic fever with moderate mitral regurgitation and diet controlled DM.  She developed the flu on March 26.  She took Tylenol only for this.  She was also noted to have a left buttock abscess and was started on doxycycline for 10 days which she has completed.  Today she went for follow-up visit with her PCP. She complained of feeling dyspnea on exertion. She also complained of intermittent chest pain which felt like heaviness and an irregular heartbeat during this chest pain.  Etiology unclear so work up in progress.    Assessment & Plan:   Active Problems:   Uncontrolled hypertension   HLD (hyperlipidemia)   CKD (chronic kidney disease), stage III (HCC)   Moderate mitral regurgitation   Diabetes mellitus with complication (HCC)   Chronic systolic CHF (congestive heart failure) (HCC)   Dyspnea and hypoxia on exertion-nicotine abuse for 50+ years- recent influenza -[place]-term smoker who quit last year  -nebs not helpful per patient -started after the flu -says more than just routine deconditioning-- usually very active on treadmill -no O2 de-saturations -echo: - Severe concentric LVH with diffuse hypokinesis and paradoxical   septal motion. Thickened aortic and mitral valve leaflets. Mild   aortic regurgitation and moderate mitral regurgitation. Trivial   posteriorly located pericardial effusion.   Evaluation for cardiac amyloidosis should be considered . -CHF team consult appreciated: PYP scan pending as well as myeloma panel -V/Q negative, CT scan of chest- no acute process  Chest discomfort-- not pain - Monitor on  telemetry for arrhythmias-PVCs ablated last year   Chronic  systolic heart failure -Currently compensated-continue Coreg, Lasix, losartan -Most recent echo on 8/18 showed an EF of 40-45% which had improved from 30-35%  Stage 3-4 CKD - slightly elevated -diuresis on hold for now  DM2 - not on medicaitons- on carb mod diet at home  Esophageal stricture status post dilatation -Continue PPI-she does not complain of dysphagia       DVT prophylaxis:  Lovenox   Code Status: Full Code   Family Communication:   Disposition Plan:  Pending improvement   Consultants:   CHF team    Subjective: Not back to baseline Continues to have unexplained dyspnea   Objective: Vitals:   01/29/18 1253 01/29/18 2000 01/30/18 0000 01/30/18 0438  BP: (!) 162/64 138/63 (!) 125/57 (!) 129/49  Pulse: 61 63 (!) 58 (!) 56  Resp: 20 18 18 18   Temp: (!) 97.4 F (36.3 C) 98.1 F (36.7 C) 98 F (36.7 C) 97.8 F (36.6 C)  TempSrc: Oral Oral Oral Oral  SpO2: 100% 97% 98% 95%  Weight:    68 kg (149 lb 14.4 oz)    Intake/Output Summary (Last 24 hours) at 01/30/2018 1050 Last data filed at 01/30/2018 1020 Gross per 24 hour  Intake 1062 ml  Output 2750 ml  Net -1688 ml   Filed Weights   01/28/18 1230 01/29/18 0523 01/30/18 0438  Weight: 68.9 kg (152 lb) 68.4 kg (150 lb 14.4 oz) 68 kg (149 lb 14.4 oz)    Examination:  General exam: in bed, tearful at times Respiratory system: diminished but no  wheezing Cardiovascular system: rrr Gastrointestinal system: +Bs Central nervous system: alert and oriented Extremities: moves all 4 ext Psychiatry: tearful at times as she says she is not used to being sick    Data Reviewed: I have personally reviewed following labs and imaging studies  CBC: Recent Labs  Lab 01/28/18 1301 01/29/18 0414 01/30/18 0505  WBC 6.5 5.5 5.0  HGB 11.5* 10.5* 10.9*  HCT 35.4* 33.0* 33.5*  MCV 88.1 88.2 88.9  PLT 236 204 419   Basic Metabolic  Panel: Recent Labs  Lab 01/28/18 1301 01/29/18 0414 01/30/18 0505  NA 134* 136 136  K 4.2 3.8 3.8  CL 102 102 101  CO2 23 22 24   GLUCOSE 111* 87 122*  BUN 33* 30* 33*  CREATININE 1.82* 1.63* 2.12*  CALCIUM 9.9 9.5 9.5   GFR: CrCl cannot be calculated (Unknown ideal weight.). Liver Function Tests: Recent Labs  Lab 01/28/18 1301  AST 17  ALT 9*  ALKPHOS 104  BILITOT 0.6  PROT 6.6  ALBUMIN 3.7   No results for input(s): LIPASE, AMYLASE in the last 168 hours. No results for input(s): AMMONIA in the last 168 hours. Coagulation Profile: No results for input(s): INR, PROTIME in the last 168 hours. Cardiac Enzymes: Recent Labs  Lab 01/29/18 1248  TROPONINI <0.03   BNP (last 3 results) No results for input(s): PROBNP in the last 8760 hours. HbA1C: No results for input(s): HGBA1C in the last 72 hours. CBG: Recent Labs  Lab 01/29/18 0828 01/29/18 1145 01/29/18 1631 01/29/18 2128 01/30/18 0748  GLUCAP 122* 123* 167* 127* 128*   Lipid Profile: No results for input(s): CHOL, HDL, LDLCALC, TRIG, CHOLHDL, LDLDIRECT in the last 72 hours. Thyroid Function Tests: No results for input(s): TSH, T4TOTAL, FREET4, T3FREE, THYROIDAB in the last 72 hours. Anemia Panel: No results for input(s): VITAMINB12, FOLATE, FERRITIN, TIBC, IRON, RETICCTPCT in the last 72 hours. Urine analysis:    Component Value Date/Time   COLORURINE STRAW (A) 01/28/2018 1353   APPEARANCEUR CLEAR 01/28/2018 1353   LABSPEC 1.006 01/28/2018 1353   PHURINE 6.0 01/28/2018 1353   GLUCOSEU NEGATIVE 01/28/2018 1353   HGBUR NEGATIVE 01/28/2018 1353   BILIRUBINUR NEGATIVE 01/28/2018 1353   KETONESUR NEGATIVE 01/28/2018 1353   PROTEINUR NEGATIVE 01/28/2018 1353   UROBILINOGEN 0.2 04/15/2012 1451   NITRITE NEGATIVE 01/28/2018 1353   LEUKOCYTESUR NEGATIVE 01/28/2018 1353     )No results found for this or any previous visit (from the past 240 hour(s)).    Anti-infectives (From admission, onward)   None        Radiology Studies: Dg Chest 2 View  Result Date: 01/28/2018 CLINICAL DATA:  Shortness of breath. EXAM: CHEST - 2 VIEW COMPARISON:  01/28/2018, 11/06/2016.  CT 07/11/2016 FINDINGS: Stable mild mediastinal fullness, most likely vascular. Calcified left AP window lymph node noted. Aortic and bilateral subclavian atherosclerotic vascular calcification. Stable cardiomegaly. No pulmonary venous congestion. No focal infiltrate. No pleural effusion or pneumothorax. Stable lower thoracic vertebral body compression fracture. IMPRESSION: 1. No acute cardiopulmonary disease. Stable calcified left AP window lymph nodes. 2. Stable cardiomegaly. Stable aortic and peripheral vascular disease. Electronically Signed   By: Marcello Moores  Register   On: 01/28/2018 13:45   Ct Chest Wo Contrast  Result Date: 01/28/2018 CLINICAL DATA:  Chest pain and shortness of breath, worse with exertion for 1 week. Ex smoker. History of CHF, hypertension. EXAM: CT CHEST WITHOUT CONTRAST TECHNIQUE: Multidetector CT imaging of the chest was performed following the standard protocol without IV contrast. COMPARISON:  Chest radiograph January 28, 2018 and CT chest July 19, 2016 FINDINGS: CARDIOVASCULAR: Heart is mildly enlarged. Trace pericardial effusion. Severe coronary artery calcifications. Thoracic aorta is normal course and caliber, severe calcific atherosclerosis. MEDIASTINUM/NODES: Coarsely calcified aortopulmonary window lymph nodes. No lymphadenopathy by CT size criteria. Normal appearance of thoracic esophagus though not tailored for evaluation. LUNGS/PLEURA: Tracheobronchial tree is patent, no pneumothorax. Mild centrilobular emphysema with upper lobe predominance. Mild biapical bullous changes. Bibasilar atelectasis/scarring. No focal consolidation or pleural effusion. A few punctate scattered calcified granulomas. UPPER ABDOMEN: Calcified granulomas in spleen. Multiple renal cysts measuring to 2.5 cm. MUSCULOSKELETAL: Nonacute.   Old mild L1 compression fracture. IMPRESSION: 1. Mild cardiomegaly.  No acute pulmonary process by noncontrast CT. 2. Old granulomatous disease. 3. Aortic Atherosclerosis (ICD10-I70.0) and Emphysema (ICD10-J43.9). Electronically Signed   By: Elon Alas M.D.   On: 01/28/2018 16:42   Nm Pulmonary Vent And Perf (v/q Scan)  Result Date: 01/28/2018 CLINICAL DATA:  Shortness of breath on exertion. EXAM: NUCLEAR MEDICINE VENTILATION - PERFUSION LUNG SCAN TECHNIQUE: Ventilation images were obtained in multiple projections using inhaled aerosol Tc-69m DTPA. Perfusion images were obtained in multiple projections after intravenous injection of Tc-110m-MAA. RADIOPHARMACEUTICALS:  6 mCi of Tc-26m DTPA aerosol inhalation and 30 mCi Tc1m-MAA IV COMPARISON:  CT chest this same day. FINDINGS: Ventilation: No focal ventilation defect. Perfusion: No wedge shaped peripheral perfusion defects to suggest acute pulmonary embolism. IMPRESSION: Negative for pulmonary embolus. Electronically Signed   By: Inge Rise M.D.   On: 01/28/2018 17:21        Scheduled Meds: . aspirin EC  81 mg Oral Daily  . atorvastatin  20 mg Oral Daily  . carvedilol  6.25 mg Oral BID  . enoxaparin (LOVENOX) injection  40 mg Subcutaneous Q24H  . insulin aspart  0-5 Units Subcutaneous QHS  . insulin aspart  0-9 Units Subcutaneous TID WC  . losartan  50 mg Oral Daily  . pantoprazole  40 mg Oral Daily  . sodium chloride flush  3 mL Intravenous Q12H   Continuous Infusions: . sodium chloride       LOS: 0 days    Time spent: 35 min    Geradine Girt, DO [facility] Pager [phone]  If 7PM-7AM, please contact night-coverage [email or web address] Password [email or web address] 01/30/2018, 10:50 AM

## 2018-01-30 NOTE — Progress Notes (Signed)
Physical Therapy Treatment Patient Details Name: Krista Mason MRN: [id number] DOB: October 09, 1936 Today's Date: 01/30/2018    History of Present Illness Pt. is a 82 y.o. F with significant PMH of HTN, [place] term smoker who quit last year, histoplasmosis, PVCs s/p ablation 2/1, systolic CHF, diet controlled DM. Of note, patient developed flu on March 26 and noted to have a left buttock abscess. Patient admitted with dyspnea on exertion and intermittent chest pain.    PT Comments    Patient is progressing very well towards their physical therapy goals. Session focused on endurance training and specifically lower extremity strength/power. Patient with improvement in activity tolerance but still complaining of increased dyspnea on exertion, SpO2 98% on RA during ambulation. 5 times sit to stand performed in 35.57 seconds, whereas greater than 15 seconds indicates patient is a fall risk. Patient continues to be at a supervision level for ambulating 180 feet with RW.     Follow Up Recommendations  Other (comment)(PT at [facility])     Equipment Recommendations  None recommended by PT    Recommendations for Other Services       Precautions / Restrictions Precautions Precautions: Fall Restrictions Weight Bearing Restrictions: No    Mobility  Bed Mobility Overal bed mobility: Modified Independent             General bed mobility comments: Modified independent with supine to sit using bed rail to elevate trunk with increased time  Transfers Overall transfer level: Modified independent Equipment used: None;Rolling walker (2 wheeled)                Ambulation/Gait Ambulation/Gait assistance: Supervision Ambulation Distance (Feet): 180 Feet Assistive device: Rolling walker (2 wheeled) Gait Pattern/deviations: Step-through pattern Gait velocity: decreased Gait velocity interpretation: Below normal speed for age/gender General Gait Details: Patient with  improved gait speed this session   Stairs            Wheelchair Mobility    Modified Rankin (Stroke Patients Only)       Balance Overall balance assessment: Needs assistance Sitting-balance support: No upper extremity supported;Feet supported Sitting balance-Leahy Scale: Normal     Standing balance support: No upper extremity supported Standing balance-Leahy Scale: Good                              Cognition Arousal/Alertness: Awake/alert Behavior During Therapy: WFL for tasks assessed/performed Overall Cognitive Status: Within Functional Limits for tasks assessed                                        Exercises Other Exercises Other Exercises: Seated marching x 30 Other Exercises: Sit to stands with no BUE support x 5    General Comments        Pertinent Vitals/Pain Pain Assessment: No/denies pain    Home Living                      Prior Function            PT Goals (current goals can now be found in the care plan section) Acute Rehab PT Goals Patient Stated Goal: figure out what's going on PT Goal Formulation: With patient Time For Goal Achievement: 02/12/18 Potential to Achieve Goals: Good Progress towards PT goals: Progressing toward goals    Frequency  Min 3X/week      PT Plan Current plan remains appropriate    Co-evaluation              AM-PAC PT "6 Clicks" Daily Activity  Outcome Measure  Difficulty turning over in bed (including adjusting bedclothes, sheets and blankets)?: None Difficulty moving from lying on back to sitting on the side of the bed? : A Lot Difficulty sitting down on and standing up from a chair with arms (e.g., wheelchair, bedside commode, etc,.)?: None Help needed moving to and from a bed to chair (including a wheelchair)?: A Little Help needed walking in hospital room?: A Little Help needed climbing 3-5 steps with a railing? : A Little 6 Click Score: 19    End  of Session Equipment Utilized During Treatment: Gait belt Activity Tolerance: Patient tolerated treatment well Patient left: Other (comment)(Seated EOB) Nurse Communication: Mobility status PT Visit Diagnosis: Unsteadiness on feet (R26.81);Dizziness and giddiness (R42)     Time: 5015-8682 PT Time Calculation (min) (ACUTE ONLY): 17 min  Charges:  $Therapeutic Exercise: 8-22 mins                    G Codes:      Ellamae Sia, PT, DPT Acute Rehabilitation Services  Pager: [phone]   Willy Eddy 01/30/2018, 2:24 PM

## 2018-01-30 NOTE — Plan of Care (Signed)
  Problem: Education: Goal: Knowledge of General Education information will improve Outcome: Progressing   Problem: Clinical Measurements: Goal: Diagnostic test results will improve Outcome: Progressing   Problem: Activity: Goal: Risk for activity intolerance will decrease Note:  Up with walker   Problem: Elimination: Goal: Will not experience complications related to urinary retention Outcome: Progressing

## 2018-01-31 ENCOUNTER — Encounter (HOSPITAL_COMMUNITY): Payer: Self-pay | Admitting: Internal Medicine

## 2018-01-31 ENCOUNTER — Observation Stay (HOSPITAL_COMMUNITY): Payer: Medicare HMO

## 2018-01-31 ENCOUNTER — Encounter (HOSPITAL_COMMUNITY)
Admission: EM | Disposition: A | Discharge status: Home / Self Care | Payer: Self-pay | Source: Home / Self Care | Attending: Emergency Medicine

## 2018-01-31 ENCOUNTER — Encounter (HOSPITAL_COMMUNITY): Payer: Medicare HMO

## 2018-01-31 DIAGNOSIS — R0600 Dyspnea, unspecified: Secondary | ICD-10-CM | POA: Diagnosis not present

## 2018-01-31 DIAGNOSIS — R0602 Shortness of breath: Secondary | ICD-10-CM

## 2018-01-31 DIAGNOSIS — I5022 Chronic systolic (congestive) heart failure: Secondary | ICD-10-CM | POA: Diagnosis not present

## 2018-01-31 DIAGNOSIS — I5023 Acute on chronic systolic (congestive) heart failure: Secondary | ICD-10-CM | POA: Diagnosis not present

## 2018-01-31 HISTORY — PX: RIGHT HEART CATH: CATH118263

## 2018-01-31 LAB — CBC
HEMATOCRIT: 32.2 % — AB (ref 36.0–46.0)
HEMOGLOBIN: 10.5 g/dL — AB (ref 12.0–15.0)
MCH: 28.8 pg (ref 26.0–34.0)
MCHC: 32.6 g/dL (ref 30.0–36.0)
MCV: 88.5 fL (ref 78.0–100.0)
PLATELETS: 196 10*3/uL (ref 150–400)
RBC: 3.64 MIL/uL — AB (ref 3.87–5.11)
RDW: 14.7 % (ref 11.5–15.5)
WBC: 4.6 10*3/uL (ref 4.0–10.5)

## 2018-01-31 LAB — POCT I-STAT 3, VENOUS BLOOD GAS (G3P V)
Acid-base deficit: 2 mmol/L (ref 0.0–2.0)
Acid-base deficit: 3 mmol/L — ABNORMAL HIGH (ref 0.0–2.0)
BICARBONATE: 23.2 mmol/L (ref 20.0–28.0)
Bicarbonate: 21.9 mmol/L (ref 20.0–28.0)
O2 Saturation: 65 %
O2 Saturation: 67 %
PCO2 VEN: 37.9 mmHg — AB (ref 44.0–60.0)
PCO2 VEN: 38.1 mmHg — AB (ref 44.0–60.0)
PO2 VEN: 35 mmHg (ref 32.0–45.0)
PO2 VEN: 35 mmHg (ref 32.0–45.0)
TCO2: 24 mmol/L (ref 22–32)
TCO2: 23 mmol/L (ref 22–32)
pH, Ven: 7.369 (ref 7.250–7.430)
pH, Ven: 7.392 (ref 7.250–7.430)

## 2018-01-31 LAB — MULTIPLE MYELOMA PANEL, SERUM
ALBUMIN/GLOB SERPL: 1.1 (ref 0.7–1.7)
Albumin SerPl Elph-Mcnc: 3.3 g/dL (ref 2.9–4.4)
Alpha 1: 0.2 g/dL (ref 0.0–0.4)
Alpha2 Glob SerPl Elph-Mcnc: 0.7 g/dL (ref 0.4–1.0)
B-GLOBULIN SERPL ELPH-MCNC: 1 g/dL (ref 0.7–1.3)
GAMMA GLOB SERPL ELPH-MCNC: 1.1 g/dL (ref 0.4–1.8)
GLOBULIN, TOTAL: 3.1 g/dL (ref 2.2–3.9)
IGA: 308 mg/dL (ref 64–422)
IgG (Immunoglobin G), Serum: 893 mg/dL (ref 700–1600)
IgM (Immunoglobulin M), Srm: 179 mg/dL (ref 26–217)
M PROTEIN SERPL ELPH-MCNC: 0.3 g/dL — AB
Total Protein ELP: 6.4 g/dL (ref 6.0–8.5)

## 2018-01-31 LAB — BASIC METABOLIC PANEL
Anion gap: 10 (ref 5–15)
BUN: 40 mg/dL — ABNORMAL HIGH (ref 6–20)
CHLORIDE: 101 mmol/L (ref 101–111)
CO2: 22 mmol/L (ref 22–32)
CREATININE: 2.09 mg/dL — AB (ref 0.44–1.00)
Calcium: 9.2 mg/dL (ref 8.9–10.3)
GFR calc non Af Amer: 21 mL/min — ABNORMAL LOW (ref >60)
GFR, EST AFRICAN AMERICAN: 25 mL/min — AB (ref >60)
Glucose, Bld: 111 mg/dL — ABNORMAL HIGH (ref 65–99)
Potassium: 3.6 mmol/L (ref 3.5–5.1)
Sodium: 133 mmol/L — ABNORMAL LOW (ref 135–145)

## 2018-01-31 LAB — GLUCOSE, CAPILLARY
GLUCOSE-CAPILLARY: 110 mg/dL — AB (ref 65–99)
Glucose-Capillary: 148 mg/dL — ABNORMAL HIGH (ref 65–99)

## 2018-01-31 LAB — PROTIME-INR
INR: 1.03
Prothrombin Time: 13.4 seconds (ref 11.4–15.2)

## 2018-01-31 SURGERY — RIGHT HEART CATH
Anesthesia: LOCAL

## 2018-01-31 MED ORDER — HEPARIN (PORCINE) IN NACL 2-0.9 UNIT/ML-% IJ SOLN
INTRAMUSCULAR | Status: AC | PRN
  Administered 2018-01-31: 500 mL

## 2018-01-31 MED ORDER — LIDOCAINE HCL (PF) 1 % IJ SOLN
INTRAMUSCULAR | Status: DC | PRN
  Administered 2018-01-31: 2 mL

## 2018-01-31 MED ORDER — SODIUM CHLORIDE 0.9 % IV SOLN: 250.0000 mL | INTRAVENOUS | Status: DC | PRN

## 2018-01-31 MED ORDER — LIDOCAINE HCL (PF) 1 % IJ SOLN
INTRAMUSCULAR | Status: AC
  Filled 2018-01-31: qty 30

## 2018-01-31 MED ORDER — ACETAMINOPHEN 325 MG PO TABS: 650.0000 mg | ORAL_TABLET | ORAL | Status: DC | PRN

## 2018-01-31 MED ORDER — MIDAZOLAM HCL 2 MG/2ML IJ SOLN
INTRAMUSCULAR | Status: AC
  Filled 2018-01-31: qty 2

## 2018-01-31 MED ORDER — ONDANSETRON HCL 4 MG/2ML IJ SOLN
4.0000 mg | Freq: Four times a day (QID) | INTRAMUSCULAR | Status: DC | PRN
Start: 2018-01-31 — End: 2018-01-31

## 2018-01-31 MED ORDER — SODIUM CHLORIDE 0.9% FLUSH: 3.0000 mL | INTRAVENOUS | Status: DC | PRN

## 2018-01-31 MED ORDER — FENTANYL CITRATE (PF) 100 MCG/2ML IJ SOLN
INTRAMUSCULAR | Status: AC
  Filled 2018-01-31: qty 2

## 2018-01-31 MED ORDER — MIDAZOLAM HCL 2 MG/2ML IJ SOLN
INTRAMUSCULAR | Status: DC | PRN
  Administered 2018-01-31: 1 mg via INTRAVENOUS

## 2018-01-31 MED ORDER — FENTANYL CITRATE (PF) 100 MCG/2ML IJ SOLN
INTRAMUSCULAR | Status: DC | PRN
  Administered 2018-01-31: 25 ug via INTRAVENOUS

## 2018-01-31 MED ORDER — SODIUM CHLORIDE 0.9% FLUSH: 3.0000 mL | Freq: Two times a day (BID) | INTRAVENOUS | Status: DC

## 2018-01-31 SURGICAL SUPPLY — 8 items
CATH BALLN WEDGE 5F 110CM (CATHETERS) ×2 IMPLANT
PACK CARDIAC CATHETERIZATION (CUSTOM PROCEDURE TRAY) ×2 IMPLANT
SHEATH RAIN 4/5FR (SHEATH) ×2 IMPLANT
TRANSDUCER W/STOPCOCK (MISCELLANEOUS) ×2 IMPLANT
TUBING ART PRESS 72  MALE/FEM (TUBING) ×1
TUBING ART PRESS 72 MALE/FEM (TUBING) ×1 IMPLANT
TUBING CIL FLEX 10 FLL-RA (TUBING) ×2 IMPLANT
WIRE EMERALD 3MM-J .025X260CM (WIRE) ×2 IMPLANT

## 2018-01-31 NOTE — Progress Notes (Signed)
Pt has orders to be discharged. Discharge instructions given and pt has no additional questions at this time. Medication regimen reviewed and pt educated. Pt verbalized understanding and has no additional questions. Telemetry box removed. IV removed and site in good condition. Pt stable and waiting for transportation. 

## 2018-01-31 NOTE — Progress Notes (Signed)
Advanced Heart Failure Rounding Note  PCP-Cardiologist: No primary care provider on file.   Subjective:    PYP scan negative for TTR amyloid.   Says she felt better this am but then walked across the room and got SOB again.   No orthopnea or PND. Sats stable at 98% on hall walk. Lasix on hold and creatinine improving slowly.   RHC today with low filling pressures (normal) and normal cardiac output.    Objective:   Weight Range: 68.1 kg (150 lb 1.6 oz) Body mass index is 24.98 kg/m.   Vital Signs:   Temp:  [97.5 F (36.4 C)-97.9 F (36.6 C)] 97.9 F (36.6 C) (04/12 0550) Pulse Rate:  [0-82] 64 (04/12 0813) Resp:  [0-22] 20 (04/12 0813) BP: (122-179)/(47-103) 145/67 (04/12 0813) SpO2:  [0 %-100 %] 100 % (04/12 0813) Weight:  [68.1 kg (150 lb 1.6 oz)] 68.1 kg (150 lb 1.6 oz) (04/12 0550) Last BM Date: 01/29/18  Weight change: Filed Weights   01/29/18 0523 01/30/18 0438 01/31/18 0550  Weight: 68.4 kg (150 lb 14.4 oz) 68 kg (149 lb 14.4 oz) 68.1 kg (150 lb 1.6 oz)    Intake/Output:   Intake/Output Summary (Last 24 hours) at 01/31/2018 0815 Last data filed at 01/31/2018 0654 Gross per 24 hour  Intake 960 ml  Output 1900 ml  Net -940 ml      Physical Exam    General:  Well appearing. No resp difficulty HEENT: normal Neck: supple. no JVD. Carotids 2+ bilat; no bruits. No lymphadenopathy or thryomegaly appreciated. Cor: PMI nondisplaced. Regular rate & rhythm. No rubs, gallops or murmurs. Lungs: clear Abdomen: soft, nontender, nondistended. No hepatosplenomegaly. No bruits or masses. Good bowel sounds. Extremities: no cyanosis, clubbing, rash, edema Neuro: alert & orientedx3, cranial nerves grossly intact. moves all 4 extremities w/o difficulty. Flat affect    Telemetry   NSR 60s personally reviewed.   EKG    N/A   Labs    CBC Recent Labs    01/30/18 0505 01/31/18 0518  WBC 5.0 4.6  HGB 10.9* 10.5*  HCT 33.5* 32.2*  MCV 88.9 88.5  PLT 205  916   Basic Metabolic Panel Recent Labs    01/30/18 0505 01/31/18 0518  NA 136 133*  K 3.8 3.6  CL 101 101  CO2 24 22  GLUCOSE 122* 111*  BUN 33* 40*  CREATININE 2.12* 2.09*  CALCIUM 9.5 9.2   Liver Function Tests Recent Labs    01/28/18 1301  AST 17  ALT 9*  ALKPHOS 104  BILITOT 0.6  PROT 6.6  ALBUMIN 3.7   No results for input(s): LIPASE, AMYLASE in the last 72 hours. Cardiac Enzymes Recent Labs    01/29/18 1248  TROPONINI <0.03    BNP: BNP (last 3 results) Recent Labs    01/28/18 1302  BNP 298.5*    ProBNP (last 3 results) No results for input(s): PROBNP in the last 8760 hours.   D-Dimer No results for input(s): DDIMER in the last 72 hours. Hemoglobin A1C No results for input(s): HGBA1C in the last 72 hours. Fasting Lipid Panel No results for input(s): CHOL, HDL, LDLCALC, TRIG, CHOLHDL, LDLDIRECT in the last 72 hours. Thyroid Function Tests No results for input(s): TSH, T4TOTAL, T3FREE, THYROIDAB in the last 72 hours.  Invalid input(s): FREET3  Other results:   Imaging    Nm Tumor Localization W Spect  Result Date: 01/30/2018 CLINICAL DATA:  HEART FAILURE. CONCERN FOR CARDIAC AMYLOIDOSIS. EXAM: NUCLEAR MEDICINE  TUMOR LOCALIZATION. PYP CARDIAC AMYLOIDOSIS SCAN WITH SPECT TECHNIQUE: Following intravenous administration of radiopharmaceutical, anterior planar images of the chest were obtained. Regions of interest were placed on the heart and contralateral chest wall for quantitative assessment. Additional SPECT imaging of the chest was obtained. RADIOPHARMACEUTICALS:  8 mCi TECHNETIUM 99 PYROPHOSPHATE FINDINGS: Planar Visual assessment: Anterior planar imaging demonstrates radiotracer uptake within the heart less than than uptake within the adjacent ribs (Grade 1). Quantitative assessment : Quantitative assessment of the cardiac uptake compared to the contralateral chest wall is equal to 1.6 (H/CL = 1.6). SPECT assessment: SPECT imaging of the chest  demonstrates minimal radiotracer accumulation within the LEFT ventricle. IMPRESSION: Visual and quantitative assessment (grade 1, H/CLL equal 1.6) are not suggestive of transthyretin amyloidosis. Electronically Signed   By: Suzy Bouchard M.D.   On: 01/30/2018 14:46     Medications:     Scheduled Medications: . [MAR Hold] aspirin EC  81 mg Oral Daily  . [MAR Hold] atorvastatin  20 mg Oral Daily  . [MAR Hold] carvedilol  6.25 mg Oral BID  . [MAR Hold] enoxaparin (LOVENOX) injection  30 mg Subcutaneous Q24H  . [MAR Hold] insulin aspart  0-5 Units Subcutaneous QHS  . [MAR Hold] insulin aspart  0-9 Units Subcutaneous TID WC  . [MAR Hold] losartan  50 mg Oral Daily  . [MAR Hold] pantoprazole  40 mg Oral Daily  . [MAR Hold] sodium chloride flush  3 mL Intravenous Q12H  . sodium chloride flush  3 mL Intravenous Q12H    Infusions: . [MAR Hold] sodium chloride    . sodium chloride    . sodium chloride 10 mL/hr at 01/31/18 0607    PRN Medications: [MAR Hold] sodium chloride, sodium chloride, [MAR Hold] acetaminophen, fentaNYL, [MAR Hold] hydroxypropyl methylcellulose / hypromellose, [MAR Hold] levalbuterol, lidocaine (PF), midazolam, [MAR Hold] ondansetron **OR** [MAR Hold] ondansetron (ZOFRAN) IV, [MAR Hold] sodium chloride flush, sodium chloride flush    Patient Profile   Krista Mason is a 82 y.o. female with hx of Combined systolic diastolic CHF, CAD, HTN, PVCs, HL, DM2, CKD, histoplasmosis, and tobacco abuse.   Admitted 01/28/18 with chest pain and worsening SOB.     Assessment/Plan   1. Dyspnea  - she continues to complain of dyspnea at rest and low level exertion.  - EF 35-40% by echo  - volume status and cardiac output normal on RHC - chest CT with mild scarring from histo. VQ negative - unclear etiology - so far subjective complaints far outweigh objective findings.  - can go home today and consider outpatient CPX testing but doubt her symptoms are cardiac.   2.  Chest pain - Doubt cardiac  - Troponin negative. ECG with LBBB - Last cath 01/2017 with heavily calcified but non-obstructive CAD. 50% RCA and 30% LAD.  - CT chest stable with old granulomatous disease. CXR without acute changes. VQ scan negative.   2. Acute on chronic combined CHF: NICM s/p PVC ablation. Now with LBBB. Echo8/18EF 40% (unchanged from prior).  - Echo  with LVEF 35-40%, Mod/Sev MR, LVH -She is dry. Creatinine up from 1.6>2.1.> 2.0 - Continue to hold lasix - PYP scan negative for TTR amyloid  3. PVCs - s/p ablation. Off Amiodarone therapy. PVC burden 2.1% 01/2017   4. LBBB - Echo as above. - If worsening cardiomyopathy, could consider CRT-D in the future.   5. Drop attacks/syncope - Continues to have on occasion. 30 day monitor unremarkable 10/2017.  6.CKD III - Baseline  creatinine 1.5-1.6.  - 1.6>2.1 Holding diuresis.     [place] for d/c home today. Can resume homme cardiac meds. Would hold lasix until Monday. Resume Monday.   Length of Stay: 0  Glori Bickers, MD  01/31/2018, 8:15 AM  Advanced Heart Failure Team Pager [phone] (M-F; 7a - 4p)  Please contact [facility] for night-coverage after hours (4p -7a ) and weekends on [email or web address]

## 2018-01-31 NOTE — Discharge Summary (Signed)
Physician Discharge Summary  Krista Mason:[id number] DOB: Aug 27, 1936 DOA: 01/28/2018  PCP: Leighton Ruff, MD  Admit date: 01/28/2018 Discharge date: 01/31/2018  Time spent: 35 minutes  Recommendations for Outpatient Follow-up:  1. Follow up dr. Tempie Hoist 2. Labs as per their protocol 3. Follow myeloma panel and labs ordered 4/10 4. D/c weight 68 k  Discharge Diagnoses:  Active Problems:   Uncontrolled hypertension   HLD (hyperlipidemia)   CKD (chronic kidney disease), stage III (HCC)   Moderate mitral regurgitation   Diabetes mellitus with complication (HCC)   Chronic systolic CHF (congestive heart failure) ([place])   Discharge Condition: good  Diet recommendation: hh low salt  Filed Weights   01/29/18 0523 01/30/18 0438 01/31/18 0550  Weight: 68.4 kg (150 lb 14.4 oz) 68 kg (149 lb 14.4 oz) 68.1 kg (150 lb 1.6 oz)    History of present illness:  Krista Mason a 82 y.o.femalewith medical history ofHTN,[place]-term smoker who quit last year, histoplasmosis, PVCs s/p ablation 2/18 as they were though to be causing cardiomyopathy, systolicCHF,rheumatic fever with moderate mitral regurgitation and diet controlled DM.  Recent Rx flu 3/26+ left buttock abscess status post doxycycline Went to PCP with chest heaviness irregular heartbeat and subjective dyspnea CT chest no pulmonary process pulse ox 86%   Hospital Course:  Dyspnea on exertion nicotine use 50 years plus plus Echo = severe concentric LVH with diffuse hypokinesis paradoxical motion CHF team saw patient PYP scan negative myeloma panel pending no acute process overall--cardiology felt subjective >objective findings and hold Lasix over the weekend and they will evaluate patient on a close follow-up visit  Will need follow-up of myeloma panel  Systolic compensated heart failure continue meds as per cardiology most recent echo 2018 EF 40-45%  Stage III-IV CKD diuresis on hold  Dysphagia stricture status post  dilatation continue PPI  Atrial fibrillation status post DC CV chronic LBBB follow-up with cardiology     Discharge Exam: Vitals:   01/31/18 0813 01/31/18 0847  BP: (!) 145/67 (!) 155/52  Pulse: 64 63  Resp: 20 20  Temp:  98 F (36.7 C)  SpO2: 100% 98%    General: Awake alert pleasant no distress tolerating diet Cardiovascular: S1-S2 no murmur patient seems to be in sinus rhythm Respiratory: Clinically clear no added sound Left arm has a bandage around No lower extremity edema  Discharge Instructions   Discharge Instructions    Diet - low sodium heart healthy   Complete by:  As directed    Discharge instructions   Complete by:  As directed    Follow with dr bensimon and follow his instructions re: medication   Increase activity slowly   Complete by:  As directed      Allergies as of 01/31/2018      Reactions   Penicillins Itching, Rash   Amoxicillin ok- IM pen is what gives reaction Has patient had a PCN reaction causing immediate rash, facial/tongue/throat swelling, SOB or lightheadedness with hypotension: Yes Has patient had a PCN reaction causing severe rash involving mucus membranes or skin necrosis: No Has patient had a PCN reaction that required hospitalization: No Has patient had a PCN reaction occurring within the last 10 years: No If all of the above answers are "NO", then may proceed with Cephalosporin use.   Ceclor [cefaclor] Itching   Crestor [rosuvastatin Calcium] Nausea Only   Epinephrine Other (See Comments)   "feels like heart is beating out of chest"   Nsaids    Pt  states she is not taking because of kidney function   Other Other (See Comments)   FLU SHOT-swelling, redness, fever   Percocet [oxycodone-acetaminophen] Nausea And Vomiting   Prednisone    Dizziness, spiked blood sugar      Medication List    TAKE these medications   acetaminophen 325 MG tablet Commonly known as:  TYLENOL Take 325 mg by mouth every 6 (six) hours as needed for  mild pain.   aspirin 81 MG tablet Take 1 tablet (81 mg total) by mouth daily.   atorvastatin 20 MG tablet Commonly known as:  LIPITOR TAKE 1 TABLET BY MOUTH DAILY What changed:    how much to take  how to take this  when to take this   carvedilol 6.25 MG tablet Commonly known as:  COREG Take 1 tablet (6.25 mg total) by mouth 2 (two) times daily.   cholecalciferol 1000 units tablet Commonly known as:  VITAMIN D Take 1,000 Units by mouth daily.   ferrous gluconate 324 MG tablet Commonly known as:  FERGON Take 324 mg by mouth every other day.   furosemide 40 MG tablet Commonly known as:  LASIX Take 1 tablet (40 mg total) by mouth every other day.   losartan 50 MG tablet Commonly known as:  COZAAR Take 1 tablet (50 mg total) by mouth daily.   omeprazole 40 MG capsule Commonly known as:  PRILOSEC Take 40 mg by mouth daily.   SYSTANE OP Place 1 drop into both eyes 2 (two) times daily as needed (dryness).      Allergies  Allergen Reactions  . Penicillins Itching and Rash    Amoxicillin ok- IM pen is what gives reaction  Has patient had a PCN reaction causing immediate rash, facial/tongue/throat swelling, SOB or lightheadedness with hypotension: Yes Has patient had a PCN reaction causing severe rash involving mucus membranes or skin necrosis: No Has patient had a PCN reaction that required hospitalization: No Has patient had a PCN reaction occurring within the last 10 years: No If all of the above answers are "NO", then may proceed with Cephalosporin use.  [name] [Cefaclor] Itching  . Crestor [Rosuvastatin Calcium] Nausea Only  . Epinephrine Other (See Comments)    "feels like heart is beating out of chest"  . Nsaids     Pt states she is not taking because of kidney function  . Other Other (See Comments)    FLU SHOT-swelling, redness, fever  . Percocet [Oxycodone-Acetaminophen] Nausea And Vomiting  . Prednisone     Dizziness, spiked blood sugar   Follow-up  Information    [facility] Follow up on 02/12/2018.   Specialty:  Cardiology Why:  at 300 pm for post hospital follow up. The code for parking is 1100. Contact information: [address] [id number] [place] [phone]           The results of significant diagnostics from this hospitalization (including imaging, microbiology, ancillary and laboratory) are listed below for reference.    Significant Diagnostic Studies: Dg Chest 2 View  Result Date: 01/28/2018 CLINICAL DATA:  Shortness of breath. EXAM: CHEST - 2 VIEW COMPARISON:  01/28/2018, 11/06/2016.  CT 07/11/2016 FINDINGS: Stable mild mediastinal fullness, most likely vascular. Calcified left AP window lymph node noted. Aortic and bilateral subclavian atherosclerotic vascular calcification. Stable cardiomegaly. No pulmonary venous congestion. No focal infiltrate. No pleural effusion or pneumothorax. Stable lower thoracic vertebral body compression fracture. IMPRESSION: 1. No acute  cardiopulmonary disease. Stable calcified left AP window lymph nodes. 2. Stable cardiomegaly. Stable aortic and peripheral vascular disease. Electronically Signed   By: Marcello Moores  Register   On: 01/28/2018 13:45   Ct Chest Wo Contrast  Result Date: 01/28/2018 CLINICAL DATA:  Chest pain and shortness of breath, worse with exertion for 1 week. Ex smoker. History of CHF, hypertension. EXAM: CT CHEST WITHOUT CONTRAST TECHNIQUE: Multidetector CT imaging of the chest was performed following the standard protocol without IV contrast. COMPARISON:  Chest radiograph January 28, 2018 and CT chest July 19, 2016 FINDINGS: CARDIOVASCULAR: Heart is mildly enlarged. Trace pericardial effusion. Severe coronary artery calcifications. Thoracic aorta is normal course and caliber, severe calcific atherosclerosis. MEDIASTINUM/NODES: Coarsely calcified aortopulmonary window lymph nodes. No lymphadenopathy by CT  size criteria. Normal appearance of thoracic esophagus though not tailored for evaluation. LUNGS/PLEURA: Tracheobronchial tree is patent, no pneumothorax. Mild centrilobular emphysema with upper lobe predominance. Mild biapical bullous changes. Bibasilar atelectasis/scarring. No focal consolidation or pleural effusion. A few punctate scattered calcified granulomas. UPPER ABDOMEN: Calcified granulomas in spleen. Multiple renal cysts measuring to 2.5 cm. MUSCULOSKELETAL: Nonacute.  Old mild L1 compression fracture. IMPRESSION: 1. Mild cardiomegaly.  No acute pulmonary process by noncontrast CT. 2. Old granulomatous disease. 3. Aortic Atherosclerosis (ICD10-I70.0) and Emphysema (ICD10-J43.9). Electronically Signed   By: Elon Alas M.D.   On: 01/28/2018 16:42   Nm Tumor Localization W Spect  Result Date: 01/30/2018 CLINICAL DATA:  HEART FAILURE. CONCERN FOR CARDIAC AMYLOIDOSIS. EXAM: NUCLEAR MEDICINE TUMOR LOCALIZATION. PYP CARDIAC AMYLOIDOSIS SCAN WITH SPECT TECHNIQUE: Following intravenous administration of radiopharmaceutical, anterior planar images of the chest were obtained. Regions of interest were placed on the heart and contralateral chest wall for quantitative assessment. Additional SPECT imaging of the chest was obtained. RADIOPHARMACEUTICALS:  8 mCi TECHNETIUM 99 PYROPHOSPHATE FINDINGS: Planar Visual assessment: Anterior planar imaging demonstrates radiotracer uptake within the heart less than than uptake within the adjacent ribs (Grade 1). Quantitative assessment : Quantitative assessment of the cardiac uptake compared to the contralateral chest wall is equal to 1.6 (H/CL = 1.6). SPECT assessment: SPECT imaging of the chest demonstrates minimal radiotracer accumulation within the LEFT ventricle. IMPRESSION: Visual and quantitative assessment (grade 1, H/CLL equal 1.6) are not suggestive of transthyretin amyloidosis. Electronically Signed   By: Suzy Bouchard M.D.   On: 01/30/2018 14:46   Nm  Pulmonary Vent And Perf (v/q Scan)  Result Date: 01/28/2018 CLINICAL DATA:  Shortness of breath on exertion. EXAM: NUCLEAR MEDICINE VENTILATION - PERFUSION LUNG SCAN TECHNIQUE: Ventilation images were obtained in multiple projections using inhaled aerosol Tc-38m DTPA. Perfusion images were obtained in multiple projections after intravenous injection of Tc-76m-MAA. RADIOPHARMACEUTICALS:  6 mCi of Tc-26m DTPA aerosol inhalation and 30 mCi Tc46m-MAA IV COMPARISON:  CT chest this same day. FINDINGS: Ventilation: No focal ventilation defect. Perfusion: No wedge shaped peripheral perfusion defects to suggest acute pulmonary embolism. IMPRESSION: Negative for pulmonary embolus. Electronically Signed   By: Inge Rise M.D.   On: 01/28/2018 17:21    Microbiology: No results found for this or any previous visit (from the past 240 hour(s)).   Labs: Basic Metabolic Panel: Recent Labs  Lab 01/28/18 1301 01/29/18 0414 01/30/18 0505 01/31/18 0518  NA 134* 136 136 133*  K 4.2 3.8 3.8 3.6  CL 102 102 101 101  CO2 23 22 24 22   GLUCOSE 111* 87 122* 111*  BUN 33* 30* 33* 40*  CREATININE 1.82* 1.63* 2.12* 2.09*  CALCIUM 9.9 9.5 9.5 9.2   Liver Function Tests:  Recent Labs  Lab 01/28/18 1301  AST 17  ALT 9*  ALKPHOS 104  BILITOT 0.6  PROT 6.6  ALBUMIN 3.7   No results for input(s): LIPASE, AMYLASE in the last 168 hours. No results for input(s): AMMONIA in the last 168 hours. CBC: Recent Labs  Lab 01/28/18 1301 01/29/18 0414 01/30/18 0505 01/31/18 0518  WBC 6.5 5.5 5.0 4.6  HGB 11.5* 10.5* 10.9* 10.5*  HCT 35.4* 33.0* 33.5* 32.2*  MCV 88.1 88.2 88.9 88.5  PLT 236 204 205 196   Cardiac Enzymes: Recent Labs  Lab 01/29/18 1248  TROPONINI <0.03   BNP: BNP (last 3 results) Recent Labs    01/28/18 1302  BNP 298.5*    ProBNP (last 3 results) No results for input(s): PROBNP in the last 8760 hours.  CBG: Recent Labs  Lab 01/30/18 0748 01/30/18 1207 01/30/18 1659  01/30/18 2112 01/31/18 0614  GLUCAP 128* 118* 139* 145* 110*       Signed:  Nita Sells MD   [facility] 01/31/2018, 10:11 AM

## 2018-01-31 NOTE — Progress Notes (Signed)
CARDIAC REHAB PHASE I   PRE:  Rate/Rhythm: 61 SR  BP:  Supine: 143/73  Sitting:   Standing:    SaO2: 99%RA  MODE:  Ambulation: 200 ft   POST:  Rate/Rhythm: 83 SR  BP:  Supine:   Sitting: 160/76  Standing:    SaO2: 97%RA 1145-1225 Pt walked 200 ft with rolling walker and minimal asst.  She stated her breathing is getting a little better. Some DOE noted but sats good on RA. Pt able to answer teachback re daily weights and low sodium. Pt stated facility will bring meals to room as needed. She stated about 400 ft to dining room. PT recommended PT at facility. Pt stated she wants to try on her own with her rollator and if she feels that she needs PT she will call Medical Doctor and request order.  Gave low sodium diets and encouraged her to increase walking slowly as tolerated, sitting with rollator as needed.   Graylon Good, RN BSN  01/31/2018 12:22 PM

## 2018-01-31 NOTE — Interval H&P Note (Signed)
History and Physical Interval Note:  01/31/2018 7:53 AM  Krista Mason  has presented today for surgery, with the diagnosis of chf  The various methods of treatment have been discussed with the patient and family. After consideration of risks, benefits and other options for treatment, the patient has consented to  Procedure(s): RIGHT HEART CATH (N/A) as a surgical intervention .  The patient's history has been reviewed, patient examined, no change in status, stable for surgery.  I have reviewed the patient's chart and labs.  Questions were answered to the patient's satisfaction.     Wilmar Prabhakar

## 2018-02-01 ENCOUNTER — Observation Stay (HOSPITAL_COMMUNITY): Payer: Medicare HMO

## 2018-02-06 ENCOUNTER — Other Ambulatory Visit (HOSPITAL_COMMUNITY): Payer: Self-pay

## 2018-02-06 DIAGNOSIS — D472 Monoclonal gammopathy: Secondary | ICD-10-CM

## 2018-02-11 ENCOUNTER — Other Ambulatory Visit (HOSPITAL_COMMUNITY): Payer: Self-pay | Admitting: Student

## 2018-02-12 ENCOUNTER — Encounter (HOSPITAL_COMMUNITY): Payer: Medicare HMO

## 2018-02-12 DIAGNOSIS — I5022 Chronic systolic (congestive) heart failure: Secondary | ICD-10-CM | POA: Diagnosis not present

## 2018-02-12 DIAGNOSIS — R0602 Shortness of breath: Secondary | ICD-10-CM | POA: Diagnosis not present

## 2018-02-13 ENCOUNTER — Encounter (HOSPITAL_COMMUNITY): Payer: Self-pay

## 2018-02-13 ENCOUNTER — Encounter (HOSPITAL_COMMUNITY): Payer: Self-pay | Admitting: *Deleted

## 2018-02-13 ENCOUNTER — Ambulatory Visit (HOSPITAL_COMMUNITY)
Admit: 2018-02-13 | Discharge: 2018-02-13 | Disposition: A | Discharge status: Home / Self Care | Payer: Medicare HMO | Attending: Internal Medicine | Admitting: Internal Medicine

## 2018-02-13 VITALS — BP 180/90 | HR 81 | Wt 154.6 lb

## 2018-02-13 DIAGNOSIS — R0600 Dyspnea, unspecified: Secondary | ICD-10-CM | POA: Diagnosis not present

## 2018-02-13 DIAGNOSIS — I2510 Atherosclerotic heart disease of native coronary artery without angina pectoris: Secondary | ICD-10-CM | POA: Insufficient documentation

## 2018-02-13 DIAGNOSIS — Z87891 Personal history of nicotine dependence: Secondary | ICD-10-CM | POA: Diagnosis not present

## 2018-02-13 DIAGNOSIS — Z8249 Family history of ischemic heart disease and other diseases of the circulatory system: Secondary | ICD-10-CM | POA: Diagnosis not present

## 2018-02-13 DIAGNOSIS — Z809 Family history of malignant neoplasm, unspecified: Secondary | ICD-10-CM | POA: Insufficient documentation

## 2018-02-13 DIAGNOSIS — N183 Chronic kidney disease, stage 3 unspecified: Secondary | ICD-10-CM

## 2018-02-13 DIAGNOSIS — I340 Nonrheumatic mitral (valve) insufficiency: Secondary | ICD-10-CM | POA: Diagnosis not present

## 2018-02-13 DIAGNOSIS — I429 Cardiomyopathy, unspecified: Secondary | ICD-10-CM | POA: Insufficient documentation

## 2018-02-13 DIAGNOSIS — I447 Left bundle-branch block, unspecified: Secondary | ICD-10-CM | POA: Insufficient documentation

## 2018-02-13 DIAGNOSIS — E785 Hyperlipidemia, unspecified: Secondary | ICD-10-CM | POA: Diagnosis not present

## 2018-02-13 DIAGNOSIS — Z9889 Other specified postprocedural states: Secondary | ICD-10-CM | POA: Diagnosis not present

## 2018-02-13 DIAGNOSIS — I428 Other cardiomyopathies: Secondary | ICD-10-CM | POA: Diagnosis not present

## 2018-02-13 DIAGNOSIS — Z79899 Other long term (current) drug therapy: Secondary | ICD-10-CM | POA: Insufficient documentation

## 2018-02-13 DIAGNOSIS — E875 Hyperkalemia: Secondary | ICD-10-CM | POA: Insufficient documentation

## 2018-02-13 DIAGNOSIS — I5042 Chronic combined systolic (congestive) and diastolic (congestive) heart failure: Secondary | ICD-10-CM | POA: Diagnosis not present

## 2018-02-13 DIAGNOSIS — Z7982 Long term (current) use of aspirin: Secondary | ICD-10-CM | POA: Insufficient documentation

## 2018-02-13 DIAGNOSIS — I5022 Chronic systolic (congestive) heart failure: Secondary | ICD-10-CM | POA: Diagnosis not present

## 2018-02-13 DIAGNOSIS — I4891 Unspecified atrial fibrillation: Secondary | ICD-10-CM | POA: Diagnosis not present

## 2018-02-13 DIAGNOSIS — E1122 Type 2 diabetes mellitus with diabetic chronic kidney disease: Secondary | ICD-10-CM | POA: Diagnosis not present

## 2018-02-13 DIAGNOSIS — G4730 Sleep apnea, unspecified: Secondary | ICD-10-CM | POA: Insufficient documentation

## 2018-02-13 DIAGNOSIS — Z823 Family history of stroke: Secondary | ICD-10-CM | POA: Insufficient documentation

## 2018-02-13 DIAGNOSIS — R079 Chest pain, unspecified: Secondary | ICD-10-CM | POA: Insufficient documentation

## 2018-02-13 DIAGNOSIS — Z833 Family history of diabetes mellitus: Secondary | ICD-10-CM | POA: Diagnosis not present

## 2018-02-13 DIAGNOSIS — I130 Hypertensive heart and chronic kidney disease with heart failure and stage 1 through stage 4 chronic kidney disease, or unspecified chronic kidney disease: Secondary | ICD-10-CM | POA: Diagnosis not present

## 2018-02-13 DIAGNOSIS — R0602 Shortness of breath: Secondary | ICD-10-CM | POA: Insufficient documentation

## 2018-02-13 LAB — BASIC METABOLIC PANEL
ANION GAP: 9 (ref 5–15)
BUN: 29 mg/dL — ABNORMAL HIGH (ref 6–20)
CO2: 23 mmol/L (ref 22–32)
Calcium: 10 mg/dL (ref 8.9–10.3)
Chloride: 103 mmol/L (ref 101–111)
Creatinine, Ser: 1.78 mg/dL — ABNORMAL HIGH (ref 0.44–1.00)
GFR calc non Af Amer: 26 mL/min — ABNORMAL LOW (ref >60)
GFR, EST AFRICAN AMERICAN: 30 mL/min — AB (ref >60)
Glucose, Bld: 213 mg/dL — ABNORMAL HIGH (ref 65–99)
POTASSIUM: 4.7 mmol/L (ref 3.5–5.1)
SODIUM: 135 mmol/L (ref 135–145)

## 2018-02-13 MED ORDER — CARVEDILOL 6.25 MG PO TABS: 9.3750 mg | ORAL_TABLET | Freq: Two times a day (BID) | ORAL | 3 refills | Status: DC

## 2018-02-13 NOTE — Progress Notes (Signed)
Patient referred to Clinical Exercise Physiologist by Darrick Grinder for guidance and discussion about safe home exercises. Exercises were demonstrated and detailed with safety precautions to patient and accompanying caregiver (if present). Patient was presented with an information packet including demonstrations of the exercises discussed. All patient's questions were answered and patient was given contact information for further questions or concerns regarding their exercise.    Landis Martins, MS, ACSM-RCEP Clinical Exercise Physiologist

## 2018-02-13 NOTE — Progress Notes (Signed)
PCP: Primary Cardiologist: Dr Vaughan Browner  HPI: Krista Mason is a 82 year old with a history of  Htn, hld, ckd, moderate mitral regurgitation, DM, and chronic systolic heart failure .  Admitted 4/9 with dyspnea. ECHO had gone down to 35-40%. Had RHC with normal filling pressures and cardiac output. Creatinine peaked at 2.1 so lasix was held for few days after discharge. Myeloma panel - 0.3 M protein noted. PYP scan was negative for TTR.   Today she returns for post hospital follow up. Overall feeling ok. Ongoing SOB with exertion. She has gradually increased walking time from 5 minutes 3x a day to 6 min 3x a day. Denies PND/Orthopnea. Denies syncope/presyncope.  Appetite ok. No fever or chills. Weight at home has been stable.  Taking all medications. Lives at [place].    2019 PYP scan negative for myeloma.   [place] 01/2018  RA = 3 RV = 29/3 PA = 25/12 (16) PCW = 8 Fick cardiac output/index = 5.3/3.0 PVR = 1.5 WU Ao sat = 97% PA sat = 65%, 67% Assessment: 1. Normal filling pressures and outputs  LHC 01/2017  Heavily calcified but non-obstructive CAD. 50% RCA and 30% LAD.   ECHO 01/2018  EF 35%  Severe concentric LVH with diffuse hypokinesis and paradoxical   septal motion. Thickened aortic and mitral valve leaflets. Mild   aortic regurgitation and moderate mitral regurgitation. Trivial   posteriorly located pericardial effusion.   Evaluation for cardiac amyloidosis should be considered .  ECHO 2018 2018, the LVEF is improved from   35-40% up to 40-45%. There is persistent moderate mitral   regurgitation.   ROS: All systems negative except as listed in HPI, PMH and Problem List.  SH:  Social History   Socioeconomic History  . Marital status: Widowed    Spouse name: Not on file  . Number of children: Not on file  . Years of education: Not on file  . Highest education level: Not on file  Occupational History  . Not on file  Social Needs  . Financial resource  strain: Not on file  . Food insecurity:    Worry: Not on file    Inability: Not on file  . Transportation needs:    Medical: Not on file    Non-medical: Not on file  Tobacco Use  . Smoking status: Former Smoker    Packs/day: 0.75    Years: 60.00    Pack years: 45.00    Last attempt to quit: 11/04/2016    Years since quitting: 1.2  . Smokeless tobacco: Never Used  Substance and Sexual Activity  . Alcohol use: No  . Drug use: No  . Sexual activity: Not on file  Lifestyle  . Physical activity:    Days per week: Not on file    Minutes per session: Not on file  . Stress: Not on file  Relationships  . Social connections:    Talks on phone: Not on file    Gets together: Not on file    Attends religious service: Not on file    Active member of club or organization: Not on file    Attends meetings of clubs or organizations: Not on file    Relationship status: Not on file  . Intimate partner violence:    Fear of current or ex partner: Not on file    Emotionally abused: Not on file    Physically abused: Not on file    Forced sexual activity: Not on file  Other Topics Concern  . Not on file  Social History Narrative  . Not on file    FH:  Family History  Problem Relation Age of Onset  . Heart attack Father   . Heart disease Father   . Cancer Father   . Hypertension Father   . Hypertension Brother   . Stroke Brother   . Hypertension Brother   . Diabetes Son   . Hyperlipidemia Son   . Hypertension Son     Past Medical History:  Diagnosis Date  . Atrial fibrillation ([place])   . Chronic combined systolic and diastolic CHF (congestive heart failure) ([place])    a. LV dysfcuntion felt to be related to PVCs  . Chronic kidney disease   . Diabetes mellitus without complication ([place])   . Hx of echocardiogram    Echo (1/16):  EF 60-65%, no RWMA, Gr 1 DD, mod AI, mod MR, mild LAE  . Hyperkalemia   . Hyperlipidemia   . Hypertension   . Orthostatic hypotension   . PVC  (premature ventricular contraction)    a. s/p PVC ablation in 11/2016  . Rheumatic fever   . Sleep apnea     Current Outpatient Medications  Medication Sig Dispense Refill  . acetaminophen (TYLENOL) 325 MG tablet Take 325 mg by mouth every 6 (six) hours as needed for mild pain.     [name] aspirin 81 MG tablet Take 1 tablet (81 mg total) by mouth daily. 30 tablet 11  . atorvastatin (LIPITOR) 20 MG tablet TAKE 1 TABLET BY MOUTH DAILY (Patient taking differently: Take 40 mg by mouth at bedtime) 30 tablet 11  . carvedilol (COREG) 6.25 MG tablet Take 1 tablet (6.25 mg total) by mouth 2 (two) times daily. 180 tablet 2  . cholecalciferol (VITAMIN D) 1000 UNITS tablet Take 1,000 Units by mouth daily.     . ferrous gluconate (FERGON) 324 MG tablet Take 324 mg by mouth every other day.    . furosemide (LASIX) 40 MG tablet Take 1 tablet (40 mg total) by mouth every other day. 30 tablet 6  . losartan (COZAAR) 50 MG tablet TAKE 1 TABLET (50 MG TOTAL) BY MOUTH DAILY. 30 tablet 0  . omeprazole (PRILOSEC) 40 MG capsule Take 40 mg by mouth daily.     Vladimir Faster Glycol-Propyl Glycol (SYSTANE OP) Place 1 drop into both eyes 2 (two) times daily as needed (dryness).     No current facility-administered medications for this encounter.     Vitals:   02/13/18 0932  BP: (!) 180/90  Pulse: 81  SpO2: 98%  Weight: 154 lb 9.6 oz (70.1 kg)    PHYSICAL EXAM: General:  Well appearing. No resp difficulty. Walked in the clinic with her daughter.  HEENT: normal Neck: supple. JVP flat. Carotids 2+ bilaterally; no bruits. No lymphadenopathy or thryomegaly appreciated. Cor: PMI normal. Regular rate & rhythm. No rubs, gallops or murmurs. Lungs: clear Abdomen: soft, nontender, nondistended. No hepatosplenomegaly. No bruits or masses. Good bowel sounds. Extremities: no cyanosis, clubbing, rash, edema Neuro: alert & orientedx3, cranial nerves grossly intact. Moves all 4 extremities w/o difficulty. Affect  pleasant.   ASSESSMENT & PLAN:  1. Dyspnea -Ongoing   - - EF 35-40% by echo  - volume status and cardiac output normal on RHC - chest CT with mild scarring from histo. VQ negative -  Set up CPX test.   2. Chest pain -Resolved.  - Last cath 01/2017 with heavily calcified but non-obstructive CAD. 50% RCA  and 30% LAD. -CT chest stable with old granulomatous disease.  2.Chronic combined CHF: NICM s/p PVC ablation. Now with LBBB. Echo8/18EF 40% (unchanged from prior). - Echo with LVEF 35-40%, Mod/Sev MR, LVH. Concern for amyloid.  -PYP scan negative for TTR amyloid.Myeloma panel -M protein 0.3. [name] discussed with Dr Beryle Beams --->   Check UPEP and SPEP today.  -Volume status stable. Continue current dose of lasix every other day. .  - Increase coreg to 9.375 mg twice a day. -Continue 50 mg losartan daily.  - Check BMET today.   3. PVCs - s/p ablation. Off Amiodarone therapy. PVC burden 2.1%01/2017  4. LBBB - EF 35-40%. May need CRT-D if EF drops.    5. Drop attacks/syncope Resolved.  - Continues to have on occasion.30 day monitor unremarkable 10/2017.  6.CKD III - Baseline creatinine 1.5-1.6.  -Up to 2.1 on discharge.  Check BMET   7. HTN Elevated today but just took morning medications.  Increase carvedilol as above.   Today she was referred to our exercise physiologist for exercise recommendations. Set up CPX.   Follow up in 6 weeks to discuss CPX results.   Dreyah Montrose NP-C  10:03 AM

## 2018-02-13 NOTE — Patient Instructions (Signed)
Labs today (will call for abnormal results, otherwise no news is good news)  INCREASE Carvedilol to 9.375 mg (1.5 Tablets) Twice Daily  Cardiopulmonary test has been ordered for you, we'll schedule at checkout  Follow up with Dr. Haroldine Laws in 6 weeks.

## 2018-02-14 LAB — PROTEIN ELECTRO, RANDOM URINE
Albumin ELP, Urine: 42 %
Alpha-1-Globulin, U: 3.5 %
Alpha-2-Globulin, U: 9.1 %
BETA GLOBULIN, U: 23.2 %
Gamma Globulin, U: 22.2 %
TOTAL PROTEIN, URINE-UPE24: 12.5 mg/dL

## 2018-02-14 LAB — PROTEIN ELECTROPHORESIS, SERUM
A/G Ratio: 1.2 (ref 0.7–1.7)
Albumin ELP: 3.7 g/dL (ref 2.9–4.4)
Alpha-1-Globulin: 0.3 g/dL (ref 0.0–0.4)
Alpha-2-Globulin: 0.8 g/dL (ref 0.4–1.0)
Beta Globulin: 1.1 g/dL (ref 0.7–1.3)
GAMMA GLOBULIN: 1.1 g/dL (ref 0.4–1.8)
Globulin, Total: 3.2 g/dL (ref 2.2–3.9)
TOTAL PROTEIN ELP: 6.9 g/dL (ref 6.0–8.5)

## 2018-02-24 ENCOUNTER — Other Ambulatory Visit (HOSPITAL_COMMUNITY): Payer: Self-pay | Admitting: *Deleted

## 2018-02-24 ENCOUNTER — Ambulatory Visit (HOSPITAL_COMMUNITY): Payer: Medicare HMO

## 2018-02-24 ENCOUNTER — Ambulatory Visit (HOSPITAL_COMMUNITY)
Admission: RE | Admit: 2018-02-24 | Discharge: 2018-02-24 | Disposition: A | Discharge status: Home / Self Care | Payer: Medicare HMO | Source: Ambulatory Visit | Attending: Cardiology | Admitting: Cardiology

## 2018-02-24 DIAGNOSIS — D472 Monoclonal gammopathy: Secondary | ICD-10-CM | POA: Diagnosis not present

## 2018-02-24 DIAGNOSIS — I5022 Chronic systolic (congestive) heart failure: Secondary | ICD-10-CM | POA: Insufficient documentation

## 2018-02-25 LAB — KAPPA/LAMBDA LIGHT CHAINS
KAPPA, LAMDA LIGHT CHAIN RATIO: 3.24 — AB (ref 0.26–1.65)
Kappa free light chain: 91 mg/L — ABNORMAL HIGH (ref 3.3–19.4)
Lambda free light chains: 28.1 mg/L — ABNORMAL HIGH (ref 5.7–26.3)

## 2018-02-27 ENCOUNTER — Telehealth (HOSPITAL_COMMUNITY): Payer: Self-pay | Admitting: Student

## 2018-02-27 NOTE — Telephone Encounter (Signed)
  Spoke with patient in regards of referral to Dr. Rosemarie Ax office.   She understands work up for TTR amyloid was negative but showed "abnormal cells in her blood" requiring further work up.    She tells me she remains fatigue and SOB with mild activity. Feels like her belly is bloated, especially on days she doesn't take lasix (Takes every other day)  Instructed to take lasix tomorrow, her scheduled day off of lasix, and call back to clinic on Monday for update on her symptoms.   Legrand Como [address]" [place], PA-C 02/27/2018 4:16 PM

## 2018-03-07 ENCOUNTER — Other Ambulatory Visit (HOSPITAL_COMMUNITY): Payer: Self-pay | Admitting: Cardiology

## 2018-03-11 ENCOUNTER — Ambulatory Visit: Payer: Medicare HMO | Admitting: Oncology

## 2018-03-11 ENCOUNTER — Encounter: Payer: Self-pay | Admitting: Oncology

## 2018-03-11 ENCOUNTER — Other Ambulatory Visit: Payer: Self-pay

## 2018-03-11 VITALS — BP 158/55 | HR 68 | Temp 97.8°F | Ht 65.0 in | Wt 155.2 lb

## 2018-03-11 DIAGNOSIS — R1310 Dysphagia, unspecified: Secondary | ICD-10-CM

## 2018-03-11 DIAGNOSIS — Z888 Allergy status to other drugs, medicaments and biological substances status: Secondary | ICD-10-CM

## 2018-03-11 DIAGNOSIS — E1122 Type 2 diabetes mellitus with diabetic chronic kidney disease: Secondary | ICD-10-CM | POA: Diagnosis not present

## 2018-03-11 DIAGNOSIS — I080 Rheumatic disorders of both mitral and aortic valves: Secondary | ICD-10-CM

## 2018-03-11 DIAGNOSIS — Z880 Allergy status to penicillin: Secondary | ICD-10-CM

## 2018-03-11 DIAGNOSIS — E1143 Type 2 diabetes mellitus with diabetic autonomic (poly)neuropathy: Secondary | ICD-10-CM

## 2018-03-11 DIAGNOSIS — R58 Hemorrhage, not elsewhere classified: Secondary | ICD-10-CM

## 2018-03-11 DIAGNOSIS — I130 Hypertensive heart and chronic kidney disease with heart failure and stage 1 through stage 4 chronic kidney disease, or unspecified chronic kidney disease: Secondary | ICD-10-CM | POA: Diagnosis not present

## 2018-03-11 DIAGNOSIS — I428 Other cardiomyopathies: Secondary | ICD-10-CM | POA: Diagnosis not present

## 2018-03-11 DIAGNOSIS — D649 Anemia, unspecified: Secondary | ICD-10-CM

## 2018-03-11 DIAGNOSIS — I5042 Chronic combined systolic (congestive) and diastolic (congestive) heart failure: Secondary | ICD-10-CM

## 2018-03-11 DIAGNOSIS — Z887 Allergy status to serum and vaccine status: Secondary | ICD-10-CM

## 2018-03-11 DIAGNOSIS — Z8679 Personal history of other diseases of the circulatory system: Secondary | ICD-10-CM

## 2018-03-11 DIAGNOSIS — M1990 Unspecified osteoarthritis, unspecified site: Secondary | ICD-10-CM

## 2018-03-11 DIAGNOSIS — Z885 Allergy status to narcotic agent status: Secondary | ICD-10-CM

## 2018-03-11 DIAGNOSIS — Z87891 Personal history of nicotine dependence: Secondary | ICD-10-CM

## 2018-03-11 DIAGNOSIS — Z8719 Personal history of other diseases of the digestive system: Secondary | ICD-10-CM

## 2018-03-11 DIAGNOSIS — Z886 Allergy status to analgesic agent status: Secondary | ICD-10-CM

## 2018-03-11 DIAGNOSIS — N184 Chronic kidney disease, stage 4 (severe): Secondary | ICD-10-CM

## 2018-03-11 DIAGNOSIS — Z8249 Family history of ischemic heart disease and other diseases of the circulatory system: Secondary | ICD-10-CM

## 2018-03-11 DIAGNOSIS — Z9889 Other specified postprocedural states: Secondary | ICD-10-CM

## 2018-03-11 DIAGNOSIS — K219 Gastro-esophageal reflux disease without esophagitis: Secondary | ICD-10-CM

## 2018-03-11 DIAGNOSIS — Z862 Personal history of diseases of the blood and blood-forming organs and certain disorders involving the immune mechanism: Secondary | ICD-10-CM

## 2018-03-11 DIAGNOSIS — N183 Chronic kidney disease, stage 3 unspecified: Secondary | ICD-10-CM

## 2018-03-11 DIAGNOSIS — Z8709 Personal history of other diseases of the respiratory system: Secondary | ICD-10-CM

## 2018-03-11 NOTE — Patient Instructions (Signed)
To lab to pick up container for 24 hour urine Return urine & get blood test on Friday, 5/24 MD visit first available to discuss results

## 2018-03-12 ENCOUNTER — Encounter: Payer: Self-pay | Admitting: Oncology

## 2018-03-12 NOTE — Progress Notes (Signed)
New Patient Hematology   ZOEE HEENEY [id number] [birth year] 82 y.o. 03/12/2018  CC: Dr. Leighton Ruff; Dr. 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  She has hypertension, diet-controlled type 2 diabetes, history of ventricular arrhythmias status post an ablation procedure in March 2018, CKD 4.  She was initially hospitalized in January 2018 with new onset of congestive heart failure.  Significant deterioration in left ventricular function compared with a January 2016 study when ejection fraction was 60-65%.  EF now down to 35-40%.  Grade 2 diastolic dysfunction.  Diffuse hypokinesis.  Mild aortic and moderate mitral valve regurgitation.  Incessant PVCs during the study felt to be contributing to her cardiomyopathy which subsequently led to the ablation procedure .  Recent April 2019 admission for acute on chronic heart failure.  Repeat echocardiogram done April 10 shows severe concentric hypertrophy of the left ventricle.  Estimated EF 35-40%.  Diffuse hypokinesis.  Grade 1 diastolic dysfunction.  Paradoxical septal motion of the ventricular septum.  Again noted was moderate mitral regurgitation.  Possibility of amyloidosis was raised.  A technetium pyrophosphate bone scan to screen for cardiac amyloidosis done April 11 read as grade 1 uptake and not suggestive of transthyretin amyloidosis.  She is chronically anemic with baseline hemoglobin 9-10 g.  Hemoglobin recorded in June 2013 was 12.9 g.  Next value recorded not until January 2018 when hemoglobin was 9.8.  Most recent CBC done January 31, 2018 with hemoglobin 10.5, hematocrit 32, MCV 88, white count 4600, no differential, platelet count 196,000. Chemistry profile on April 9: Total protein 6.6, albumin 3.7.  Creatinine 1.8.  Estimated GFR 25 mL/min. Serum protein electrophoresis April 15: IgG 893, IgA 308, IgM 179.  There was a  minor 0.3 g% M spike on that study with immunofixation electrophoresis showing presence of monoclonal IgA and monoclonal IgG kappa restriction.  A repeat serum study done on April 25 did not show an M spike and immunofixation electrophoresis did not detect any monoclonal protein.  Serum free kappa/lambda light chains 91/28 with ratio 3.24, reference range 0.26-1.65 done on May 6. Random urine protein electrophoresis done on April 25 showed no monoclonal protein. She does have symptoms compatible with her autonomic neuropathy with orthostatic dizziness. At one point she did have a component of iron deficiency anemia which was corrected.  She has had to have periodic esophageal dilatations for strictures.  There is no obvious family history of any bleeding disorder.  Her father died at age 23.  He was getting injections to maintain his hemoglobin.  Not clear whether he had a myelodysplastic syndrome or whether this was due to kidney dysfunction.  He had only one kidney.  None of her 3 siblings or her 2 children have any hematologic issues.    PMH: Past Medical History:  Diagnosis Date  . Atrial fibrillation ([place])   . Chronic combined systolic and diastolic CHF (congestive heart failure) ([place])    a. LV dysfcuntion felt to be related to PVCs  . Chronic kidney disease   . Diabetes mellitus without complication ([place])   . Hx of echocardiogram    Echo (1/16):  EF 60-65%, no RWMA, Gr 1 DD, mod AI, mod MR, mild LAE  . Hyperkalemia   . Hyperlipidemia   . Hypertension   . Orthostatic hypotension   . PVC (premature ventricular contraction)    a. s/p PVC ablation in  11/2016  . Rheumatic fever   . Sleep apnea   History of histoplasmosis left upper lung not requiring treatment chronic changes on chest radiograph.  She grew up in [place]. Borderline obstructive airway disease per her history. She denies previous ulcers.  She does have reflux.  History of esophageal stricture requiring periodic dilation.   Follows with Dr. Cristina Gong for this. No thyroid disease.  No history of pneumonia, tuberculosis, hepatitis, yellow jaundice, malaria, mononucleosis, positive degenerative arthritis.  Negative inflammatory arthritis.  Denies lupus.  Denies rheumatoid arthritis.  No prior history of stroke.   Past Surgical History:  Procedure Laterality Date  . KNEE SURGERY Left 2009  . OVARIAN CYST REMOVAL    . PVC ABLATION N/A 11/30/2016   Procedure: PVC Ablation;  Surgeon: Will [name], MD;  Location: [facility];  Service: Cardiovascular;  Laterality: N/A;  . RIGHT HEART CATH N/A 01/31/2018   Procedure: RIGHT HEART CATH;  Surgeon: Jolaine Artist, MD;  Location: [facility];  Service: Cardiovascular;  Laterality: N/A;  . RIGHT/LEFT HEART CATH AND CORONARY ANGIOGRAPHY N/A 01/25/2017   Procedure: Right/Left Heart Cath and Coronary Angiography;  Surgeon: Jolaine Artist, MD;  Location: [facility];  Service: Cardiovascular;  Laterality: N/A;    Allergies: Allergies  Allergen Reactions  . Penicillins Itching and Rash    Amoxicillin ok- IM pen is what gives reaction  Has patient had a PCN reaction causing immediate rash, facial/tongue/throat swelling, SOB or lightheadedness with hypotension: Yes Has patient had a PCN reaction causing severe rash involving mucus membranes or skin necrosis: No Has patient had a PCN reaction that required hospitalization: No Has patient had a PCN reaction occurring within the last 10 years: No If all of the above answers are "NO", then may proceed with Cephalosporin use.  [name] [Cefaclor] Itching  . Crestor [Rosuvastatin Calcium] Nausea Only  . Epinephrine Other (See Comments)    "feels like heart is beating out of chest"  . Nsaids     Pt states she is not taking because of kidney function  . Other Other (See Comments)    FLU SHOT-swelling, redness, fever  . Percocet [Oxycodone-Acetaminophen] Nausea And Vomiting  . Prednisone      Dizziness, spiked blood sugar    Medications:  Current Outpatient Medications:  .  acetaminophen (TYLENOL) 325 MG tablet, Take 325 mg by mouth every 6 (six) hours as needed for mild pain. , Disp: , Rfl:  .  aspirin 81 MG tablet, Take 1 tablet (81 mg total) by mouth daily., Disp: 30 tablet, Rfl: 11 .  atorvastatin (LIPITOR) 20 MG tablet, TAKE 1 TABLET BY MOUTH DAILY (Patient taking differently: Take 40 mg by mouth at bedtime), Disp: 30 tablet, Rfl: 11 .  carvedilol (COREG) 6.25 MG tablet, Take 1.5 tablets (9.375 mg total) by mouth 2 (two) times daily., Disp: 270 tablet, Rfl: 3 .  cholecalciferol (VITAMIN D) 1000 UNITS tablet, Take 1,000 Units by mouth daily. , Disp: , Rfl:  .  ferrous gluconate (FERGON) 324 MG tablet, Take 324 mg by mouth every other day., Disp: , Rfl:  .  furosemide (LASIX) 40 MG tablet, Take 1 tablet (40 mg total) by mouth every other day., Disp: 30 tablet, Rfl: 6 .  losartan (COZAAR) 50 MG tablet, TAKE 1 TABLET BY MOUTH EVERY DAY, Disp: 30 tablet, Rfl: 0 .  omeprazole (PRILOSEC) 40 MG capsule, Take 40 mg by mouth daily. , Disp: , Rfl:  .  Polyethyl  Glycol-Propyl Glycol (SYSTANE OP), Place 1 drop into both eyes 2 (two) times daily as needed (dryness)., Disp: , Rfl:   Social History: She is a widow.  Retired Marine scientist.  One daughter and 2 sons.  1 of her sons had a MI at age 72.  Her daughter accompanies her today.  she quit smoking about 16 months ago. She has a 45.00 pack-year smoking history. She has never used smokeless tobacco. she does not drink alcohol or use drugs.  Family History: Family History  Problem Relation Age of Onset  . Heart attack Father   . Heart disease Father   . Cancer Father   . Hypertension Father   . Hypertension Brother   . Stroke Brother   . Hypertension Brother   . Diabetes Son   . Hyperlipidemia Son   . Hypertension Son   Her father died at age 41.  He was getting injections to maintain his hemoglobin.  He had only one kidney.  No other  family members with blood disorders. She had 3 brothers.  2 are deceased.  One is alive.  Review of Systems: See H&P She has had a change in her bowel habit recently with loose bowel movements.  Occasional blood on the toilet tissue.  Recurrent dysphagia with solid food and pills.  She has GI intolerance to oral iron.  She denies any paresthesias. Remaining ROS negative.  Physical Exam: Blood pressure (!) 158/55, pulse 68, temperature 97.8 F (36.6 C), temperature source Oral, height 5\' 5"  (1.651 m), weight 155 lb 3.2 oz (70.4 kg), SpO2 100 %. Wt Readings from Last 3 Encounters:  03/11/18 155 lb 3.2 oz (70.4 kg)  02/13/18 154 lb 9.6 oz (70.1 kg)  01/31/18 150 lb 1.6 oz (68.1 kg)     General appearance: Well-nourished Caucasian woman HENNT: Periorbital edema but no pinch purpura.  Pharynx no erythema, exudate, mass, or ulcer.  Tongue appears normal.  No hypertrophy.  No scalloping.  No thyromegaly or thyroid nodules Lymph nodes: No cervical, supraclavicular, or axillary lymphadenopathy Breasts:  Lungs: Clear to auscultation, resonant to percussion throughout Heart: Regular rhythm, no murmur, no gallop, no rub, no click, no edema Abdomen: Soft, nontender, normal bowel sounds, no mass, no organomegaly Extremities: No edema, no calf tenderness.  Pronounced cyanosis of her feet in the dependent position. Musculoskeletal: no joint deformities GU:  Vascular: Carotid pulses 2+, no bruits, distal pulses: Dorsalis pedis and posterior tibial pulses are not palpable on either foot.  Cyanotic changes noted above. Neurologic: Alert, oriented, PERRLA,  cranial nerves grossly normal, motor strength 5 over 5, reflexes 1+ symmetric, upper body coordination normal, gait normal, moderate decrease in vibration sensation over the fingertips by tuning fork exam Skin: No rash.  Scattered ecchymosis on the skin of her forearms bilaterally.    Lab Results: Lab Results  Component Value Date   WBC 4.6  01/31/2018   HGB 10.5 (L) 01/31/2018   HCT 32.2 (L) 01/31/2018   MCV 88.5 01/31/2018   PLT 196 01/31/2018     Chemistry      Component Value Date/Time   NA 135 02/13/2018 0955   NA 137 11/27/2016 1557   K 4.7 02/13/2018 0955   CL 103 02/13/2018 0955   CO2 23 02/13/2018 0955   BUN 29 (H) 02/13/2018 0955   BUN 21 11/27/2016 1557   CREATININE 1.78 (H) 02/13/2018 0955      Component Value Date/Time   CALCIUM 10.0 02/13/2018 0955   ALKPHOS 104 01/28/2018 1301  AST 17 01/28/2018 1301   ALT 9 (L) 01/28/2018 1301   BILITOT 0.6 01/28/2018 1301       Radiological Studies: No results found.    Impression: Normochromic anemia. She has a number of features suggestive of amyloidosis including renal dysfunction, diastolic cardiac dysfunction, autonomic insufficiency, signs of peripheral neuropathy on exam. Although she has an elevated serum free light chain ratio, she has no monoclonal protein in the serum or urine and normal quantitative immunoglobulins which argues against AL amyloidosis.  A technetium pyrophosphate bone scan which is fairly sensitive for transthyretin amyloidosis was negative. By process of elimination I would have to say that her nonischemic cardiomyopathy  and chronic renal dysfunction are most likely related to Slivinski-standing hypertension and that the main reason for her normochromic anemia is related to her degree of renal dysfunction. Just to be sure, I am going to go ahead and get a 24-hour urine for total protein, creatinine clearance, and immunofixation electrophoresis.  Given normal total quantitative immunoglobulins I doubt that we are going to find significant light chain proteinuria. Of note, small amounts of monoclonal protein in the serum and/or urine are seen in about 5% of all patients over the age of 55 and unless there is significant proteinuria of greater than or equal to 500 mg, recommendation will be for observation alone with serial monitoring.     Recommendation: See discussion above. My impressions and management plan discussed at length with the patient and her daughter.    Murriel Hopper, MD, [place]  Hematology-Oncology/Internal Medicine  03/12/2018, 10:20 AM

## 2018-03-13 DIAGNOSIS — D649 Anemia, unspecified: Secondary | ICD-10-CM | POA: Diagnosis not present

## 2018-03-13 DIAGNOSIS — N183 Chronic kidney disease, stage 3 (moderate): Secondary | ICD-10-CM | POA: Diagnosis not present

## 2018-03-14 ENCOUNTER — Ambulatory Visit (HOSPITAL_COMMUNITY)
Admission: RE | Admit: 2018-03-14 | Discharge: 2018-03-14 | Disposition: A | Discharge status: Home / Self Care | Payer: Medicare HMO | Source: Ambulatory Visit | Attending: Internal Medicine | Admitting: Internal Medicine

## 2018-03-14 ENCOUNTER — Encounter (HOSPITAL_COMMUNITY): Payer: Self-pay | Admitting: Internal Medicine

## 2018-03-14 ENCOUNTER — Other Ambulatory Visit (INDEPENDENT_AMBULATORY_CARE_PROVIDER_SITE_OTHER): Payer: Medicare HMO

## 2018-03-14 VITALS — BP 165/64 | HR 53 | Wt 153.0 lb

## 2018-03-14 DIAGNOSIS — E875 Hyperkalemia: Secondary | ICD-10-CM | POA: Diagnosis not present

## 2018-03-14 DIAGNOSIS — I447 Left bundle-branch block, unspecified: Secondary | ICD-10-CM | POA: Diagnosis not present

## 2018-03-14 DIAGNOSIS — N183 Chronic kidney disease, stage 3 unspecified: Secondary | ICD-10-CM

## 2018-03-14 DIAGNOSIS — E1122 Type 2 diabetes mellitus with diabetic chronic kidney disease: Secondary | ICD-10-CM | POA: Insufficient documentation

## 2018-03-14 DIAGNOSIS — D649 Anemia, unspecified: Secondary | ICD-10-CM

## 2018-03-14 DIAGNOSIS — I2510 Atherosclerotic heart disease of native coronary artery without angina pectoris: Secondary | ICD-10-CM | POA: Diagnosis not present

## 2018-03-14 DIAGNOSIS — E785 Hyperlipidemia, unspecified: Secondary | ICD-10-CM | POA: Insufficient documentation

## 2018-03-14 DIAGNOSIS — I340 Nonrheumatic mitral (valve) insufficiency: Secondary | ICD-10-CM | POA: Insufficient documentation

## 2018-03-14 DIAGNOSIS — Z8249 Family history of ischemic heart disease and other diseases of the circulatory system: Secondary | ICD-10-CM | POA: Diagnosis not present

## 2018-03-14 DIAGNOSIS — Z8349 Family history of other endocrine, nutritional and metabolic diseases: Secondary | ICD-10-CM | POA: Insufficient documentation

## 2018-03-14 DIAGNOSIS — I130 Hypertensive heart and chronic kidney disease with heart failure and stage 1 through stage 4 chronic kidney disease, or unspecified chronic kidney disease: Secondary | ICD-10-CM | POA: Diagnosis not present

## 2018-03-14 DIAGNOSIS — J984 Other disorders of lung: Secondary | ICD-10-CM | POA: Diagnosis not present

## 2018-03-14 DIAGNOSIS — I429 Cardiomyopathy, unspecified: Secondary | ICD-10-CM | POA: Insufficient documentation

## 2018-03-14 DIAGNOSIS — D472 Monoclonal gammopathy: Secondary | ICD-10-CM

## 2018-03-14 DIAGNOSIS — Z7982 Long term (current) use of aspirin: Secondary | ICD-10-CM | POA: Insufficient documentation

## 2018-03-14 DIAGNOSIS — Z809 Family history of malignant neoplasm, unspecified: Secondary | ICD-10-CM | POA: Diagnosis not present

## 2018-03-14 DIAGNOSIS — R55 Syncope and collapse: Secondary | ICD-10-CM | POA: Insufficient documentation

## 2018-03-14 DIAGNOSIS — Z79899 Other long term (current) drug therapy: Secondary | ICD-10-CM | POA: Diagnosis not present

## 2018-03-14 DIAGNOSIS — Z87891 Personal history of nicotine dependence: Secondary | ICD-10-CM | POA: Diagnosis not present

## 2018-03-14 DIAGNOSIS — I739 Peripheral vascular disease, unspecified: Secondary | ICD-10-CM | POA: Diagnosis not present

## 2018-03-14 DIAGNOSIS — R079 Chest pain, unspecified: Secondary | ICD-10-CM | POA: Insufficient documentation

## 2018-03-14 DIAGNOSIS — I493 Ventricular premature depolarization: Secondary | ICD-10-CM

## 2018-03-14 DIAGNOSIS — R0609 Other forms of dyspnea: Secondary | ICD-10-CM | POA: Diagnosis not present

## 2018-03-14 DIAGNOSIS — I5042 Chronic combined systolic (congestive) and diastolic (congestive) heart failure: Secondary | ICD-10-CM | POA: Insufficient documentation

## 2018-03-14 DIAGNOSIS — I5022 Chronic systolic (congestive) heart failure: Secondary | ICD-10-CM | POA: Diagnosis not present

## 2018-03-14 DIAGNOSIS — Z79811 Long term (current) use of aromatase inhibitors: Secondary | ICD-10-CM | POA: Insufficient documentation

## 2018-03-14 DIAGNOSIS — R0602 Shortness of breath: Secondary | ICD-10-CM | POA: Diagnosis not present

## 2018-03-14 MED ORDER — HYDRALAZINE HCL 25 MG PO TABS: 12.5000 mg | ORAL_TABLET | Freq: Three times a day (TID) | ORAL | 3 refills | Status: DC

## 2018-03-14 NOTE — Progress Notes (Signed)
Krista Mason  ADVANCED HF CLINIC NOTE   Primary Cardiologist: Dr Vaughan Browner  HPI: Krista Mason is a 82 year old with a history of HTN, HL, CKD 3, moderate mitral regurgitation, DM, and chronic systolic heart failure .  Admitted 4/9 with dyspnea. ECHO had gone down to 35-40% (previously 45%). Had RHC with normal filling pressures and cardiac output. Creatinine peaked at 2.1 so lasix was held for few days after discharge. Myeloma panel - 0.3 M protein noted. PYP scan was negative for TTR. Has seen Dr. Jolyn Lent and not felt to have myeloma.   Has had CPX test 5/19 which showed - submax test with moderate to severe functional limitation due to HF, lung disease and deconditioning. However, markedly elevated Ve/VCO2 slope points to HF as being the predominant limitation and would suggest work-up for advanced therapies if patient is a candidate.    Today she returns for post hospital follow up. Here with her family. Initially she said she wasn't feeling any better but then she said that she was able to do more and getting stronger. Remains in an independent living facility. Can do ADLs without too much problem. Sleeps in recliner for comfort. Weight going up gradually. Gets dizzy for a few seconds when standing. No syncope or presyncope. SBP 130-160 (140s average). Took extra lasix one time. Says she stopped CPX mostly due to leg pain. Previous ABIs 0.70 in 2014    5/19 CPX FVC 1.97 (73%)    FEV1 1.24 (61%)     FEV1/FVC 63 (81%)     MVV 49 (59%)     Resting HR: 58 Peak HR: 110  (78% age predicted max HR) BP rest: 152/66 BP peak: 214/70 Peak VO2: 9.6 (57% predicted peak VO2) VE/VCO2 slope: 50 OUES: 0.87 Peak RER: 0.93 VE/MVV: 70% O2pulse: 6  (75% predicted O2pulse)   01/2018 PYP scan negative for TTR  RHC 01/2018  RA = 3 RV = 29/3 PA = 25/12 (16) PCW = 8 Fick cardiac output/index = 5.3/3.0 PVR = 1.5 WU Ao sat = 97% PA sat = 65%, 67% Assessment: 1. Normal filling pressures and  outputs  LHC 01/2017  Heavily calcified but non-obstructive CAD. 50% RCA and 30% LAD.   ECHO 01/2018  EF 35%  Severe concentric LVH with diffuse hypokinesis and paradoxical   septal motion. Thickened aortic and mitral valve leaflets. Mild   aortic regurgitation and moderate mitral regurgitation. Trivial   posteriorly located pericardial effusion.   Evaluation for cardiac amyloidosis should be considered .  ECHO 2018 2018, the LVEF is improved from   35-40% up to 40-45%. There is persistent moderate mitral   regurgitation.   ROS: All systems negative except as listed in HPI, PMH and Problem List.  SH:  Social History   Socioeconomic History  . Marital status: Widowed    Spouse name: Not on file  . Number of children: Not on file  . Years of education: Not on file  . Highest education level: Not on file  Occupational History  . Not on file  Social Needs  . Financial resource strain: Not on file  . Food insecurity:    Worry: Not on file    Inability: Not on file  . Transportation needs:    Medical: Not on file    Non-medical: Not on file  Tobacco Use  . Smoking status: Former Smoker    Packs/day: 0.75    Years: 60.00    Pack years: 45.00    Last attempt  to quit: 11/04/2016    Years since quitting: 1.3  . Smokeless tobacco: Never Used  Substance and Sexual Activity  . Alcohol use: No  . Drug use: No  . Sexual activity: Not on file  Lifestyle  . Physical activity:    Days per week: Not on file    Minutes per session: Not on file  . Stress: Not on file  Relationships  . Social connections:    Talks on phone: Not on file    Gets together: Not on file    Attends religious service: Not on file    Active member of club or organization: Not on file    Attends meetings of clubs or organizations: Not on file    Relationship status: Not on file  . Intimate partner violence:    Fear of current or ex partner: Not on file    Emotionally abused: Not on file     Physically abused: Not on file    Forced sexual activity: Not on file  Other Topics Concern  . Not on file  Social History Narrative  . Not on file    FH:  Family History  Problem Relation Age of Onset  . Heart attack Father   . Heart disease Father   . Cancer Father   . Hypertension Father   . Hypertension Brother   . Stroke Brother   . Hypertension Brother   . Diabetes Son   . Hyperlipidemia Son   . Hypertension Son     Past Medical History:  Diagnosis Date  . Atrial fibrillation ([place])   . Chronic combined systolic and diastolic CHF (congestive heart failure) ([place])    a. LV dysfcuntion felt to be related to PVCs  . Chronic kidney disease   . Diabetes mellitus without complication ([place])   . Hx of echocardiogram    Echo (1/16):  EF 60-65%, no RWMA, Gr 1 DD, mod AI, mod MR, mild LAE  . Hyperkalemia   . Hyperlipidemia   . Hypertension   . Orthostatic hypotension   . PVC (premature ventricular contraction)    a. s/p PVC ablation in 11/2016  . Rheumatic fever   . Sleep apnea     Current Outpatient Medications  Medication Sig Dispense Refill  . acetaminophen (TYLENOL) 325 MG tablet Take 325 mg by mouth every 6 (six) hours as needed for mild pain.     Krista Mason aspirin 81 MG tablet Take 1 tablet (81 mg total) by mouth daily. 30 tablet 11  . atorvastatin (LIPITOR) 20 MG tablet TAKE 1 TABLET BY MOUTH DAILY (Patient taking differently: Take 40 mg by mouth at bedtime) 30 tablet 11  . carvedilol (COREG) 6.25 MG tablet Take 1.5 tablets (9.375 mg total) by mouth 2 (two) times daily. 270 tablet 3  . cholecalciferol (VITAMIN D) 1000 UNITS tablet Take 1,000 Units by mouth daily.     . ferrous gluconate (FERGON) 324 MG tablet Take 324 mg by mouth every other day.    . furosemide (LASIX) 40 MG tablet Take 1 tablet (40 mg total) by mouth every other day. 30 tablet 6  . losartan (COZAAR) 50 MG tablet TAKE 1 TABLET BY MOUTH EVERY DAY 30 tablet 0  . omeprazole (PRILOSEC) 40 MG capsule Take 40 mg  by mouth daily.     Vladimir Faster Glycol-Propyl Glycol (SYSTANE OP) Place 1 drop into both eyes 2 (two) times daily as needed (dryness).     No current facility-administered medications for this encounter.  Vitals:   03/14/18 0949  BP: (!) 165/64  Pulse: (!) 53  SpO2: 99%  Weight: 153 lb (69.4 kg)   Filed Weights   03/14/18 0949  Weight: 153 lb (69.4 kg)   PHYSICAL EXAM: General:  Elderly. No resp difficulty HEENT: normal Neck: supple. JVP 5-6. Carotids 2+ bilat; no bruits. No lymphadenopathy or thryomegaly appreciated. Cor: PMI nondisplaced. Regular rate & rhythm. No rubs, gallops or murmurs. Lungs: clear with decreased BS throughout Abdomen: soft, nontender, nondistended. No hepatosplenomegaly. No bruits or masses. Good bowel sounds. Extremities: no cyanosis, clubbing, rash, edema + venous cyanosis Neuro: alert & orientedx3, cranial nerves grossly intact. moves all 4 extremities w/o difficulty. Affect flat  ASSESSMENT & PLAN:  1. Dyspnea and exercise intolerance  -EF 35-40% by echo  - volume status low and cardiac output normal on RHC - chest CT with mild scarring from histo. VQ negative - CPX test shows moderate to severe exercise limitation due to a combination of HF, PAD, deconditinoing and lung disease  - Based on all available data I am not sure that her CHF is the major issue here though it is certainly playing a role.  - Given age and comorbidities she is not candidate for VAD or transplant. Only real option would be CRT-P.  - However we discussed options and before proceeding to CRT, will try 3 months of exercise program and also check ABIs  2. Chest pain - Resolved.  - Last cath 01/2017 with heavily calcified but non-obstructive CAD. 50% RCA and 30% LAD. -CT chest stable with old granulomatous disease. - Continue ASA and statin  2.Chronic combined CHF: NICM s/p PVC ablation. Now with LBBB. Echo8/18EF 40% (unchanged from prior). - Echo with LVEF 35-40%,  Mod/Sev MR, LVH. Concern for amyloid.  -PYP scan negative for TTR amyloid. Has MGUS and has seen Dr. Beryle Beams but low suspicion for AL amyloid/myeloma. 24 hour urine in progress. - Volume status stable. Continue current dose of lasix every other day. .  - Continue coreg 9.375 mg twice a day. - Continue losartan 50 mg bid (can consider Entresto at next visit if creatinine improved) - Start hydralazine 12.5mg  tid  - Check BMET today.  - see discussion above  3. PVCs - s/p ablation. Off Amiodarone therapy. PVC burden 2.1%01/2017  4. LBBB - EF 35-40%. May need CRT-P if symptoms not improving  5. Drop attacks/syncope Resolved.  - Continues to have on occasion.30 day monitor unremarkable 10/2017.  6.CKD III - Baseline creatinine 1.5-1.6.  - Up to 2.1 on 01/31/18  - Check BMET today  7. HTN - Elevated. Will add hydralazine - Can switch losartan to Entresto at next visit if creatinine stable  Total time spent 50 minutes. Over half that time spent discussing above.   Glori Bickers MD  10:00 AM

## 2018-03-14 NOTE — Patient Instructions (Addendum)
Start Hydralazine 12.5 mg (1/2 tab) Three times a day   Your physician has requested that you have an ankle brachial index (ABI). During this test an ultrasound and blood pressure cuff are used to evaluate the arteries that supply the arms and legs with blood. Allow thirty minutes for this exam. There are no restrictions or special instructions.  You have been referred for Physical Therapy with [facility] physician recommends that you schedule a follow-up appointment in: 2-3 months

## 2018-03-15 LAB — BUN+CREAT
BUN / CREAT RATIO: 20 (ref 12–28)
BUN: 36 mg/dL — AB (ref 8–27)
CREATININE: 1.76 mg/dL — AB (ref 0.57–1.00)
GFR, EST AFRICAN AMERICAN: 31 mL/min/{1.73_m2} — AB (ref >59)
GFR, EST NON AFRICAN AMERICAN: 27 mL/min/{1.73_m2} — AB (ref >59)

## 2018-03-15 LAB — ERYTHROPOIETIN: Erythropoietin: 25.2 m[IU]/mL — ABNORMAL HIGH (ref 2.6–18.5)

## 2018-03-19 ENCOUNTER — Other Ambulatory Visit: Payer: Self-pay | Admitting: Cardiology

## 2018-03-19 ENCOUNTER — Other Ambulatory Visit (HOSPITAL_COMMUNITY): Payer: Self-pay | Admitting: Internal Medicine

## 2018-03-21 ENCOUNTER — Other Ambulatory Visit: Payer: Self-pay | Admitting: Internal Medicine

## 2018-03-21 DIAGNOSIS — I739 Peripheral vascular disease, unspecified: Secondary | ICD-10-CM

## 2018-03-21 LAB — CREATININE CLEARANCE, URINE, 24 HOUR
CREAT CLEAR: 32 mL/min — AB (ref 88–128)
CREATININE 24H UR: 774 mg/(24.h) — AB (ref 800–1800)
Creatinine, Ser: 1.7 mg/dL — ABNORMAL HIGH (ref 0.57–1.00)
Creatinine, Urine: 38.7 mg/dL
GFR calc Af Amer: 33 mL/min/{1.73_m2} — ABNORMAL LOW (ref >59)
GFR, EST NON AFRICAN AMERICAN: 28 mL/min/{1.73_m2} — AB (ref >59)

## 2018-03-21 LAB — UPEP/TP, 24-HR URINE
Albumin, U: 30.2 %
Alpha 1, Urine: 4.6 %
Alpha 2, Urine: 18 %
BETA UR: 23.8 %
GAMMA UR: 23.4 %
PROTEIN 24H UR: 136 mg/(24.h) (ref 30–150)
Protein, Ur: 6.8 mg/dL

## 2018-03-24 ENCOUNTER — Telehealth: Payer: Self-pay

## 2018-03-24 NOTE — Telephone Encounter (Signed)
Requesting lab results. Please call back.  

## 2018-03-25 NOTE — Telephone Encounter (Signed)
I called her 6/3 PM

## 2018-03-26 ENCOUNTER — Ambulatory Visit (HOSPITAL_COMMUNITY)
Admission: RE | Admit: 2018-03-26 | Discharge: 2018-03-26 | Disposition: A | Discharge status: Home / Self Care | Payer: Medicare HMO | Source: Ambulatory Visit | Attending: Cardiology | Admitting: Cardiology

## 2018-03-26 DIAGNOSIS — I739 Peripheral vascular disease, unspecified: Secondary | ICD-10-CM

## 2018-03-27 ENCOUNTER — Encounter (HOSPITAL_COMMUNITY): Payer: Medicare HMO | Admitting: Internal Medicine

## 2018-04-03 ENCOUNTER — Telehealth (HOSPITAL_COMMUNITY): Payer: Self-pay | Admitting: Cardiology

## 2018-04-03 DIAGNOSIS — R0600 Dyspnea, unspecified: Secondary | ICD-10-CM

## 2018-04-03 DIAGNOSIS — I5022 Chronic systolic (congestive) heart failure: Secondary | ICD-10-CM

## 2018-04-03 NOTE — Telephone Encounter (Signed)
Patient called to report she never received referral for in home PT [facility])  [facility] representative reports they never received original referral as they have been experiencing company wide fax issues  New referral placed and faxed to  [phone]

## 2018-04-04 DIAGNOSIS — N183 Chronic kidney disease, stage 3 (moderate): Secondary | ICD-10-CM | POA: Diagnosis not present

## 2018-04-04 DIAGNOSIS — E1122 Type 2 diabetes mellitus with diabetic chronic kidney disease: Secondary | ICD-10-CM | POA: Diagnosis not present

## 2018-04-04 DIAGNOSIS — M545 Low back pain: Secondary | ICD-10-CM | POA: Diagnosis not present

## 2018-04-04 DIAGNOSIS — I5042 Chronic combined systolic (congestive) and diastolic (congestive) heart failure: Secondary | ICD-10-CM | POA: Diagnosis not present

## 2018-04-04 DIAGNOSIS — I130 Hypertensive heart and chronic kidney disease with heart failure and stage 1 through stage 4 chronic kidney disease, or unspecified chronic kidney disease: Secondary | ICD-10-CM | POA: Diagnosis not present

## 2018-04-05 ENCOUNTER — Other Ambulatory Visit (HOSPITAL_COMMUNITY): Payer: Self-pay | Admitting: Internal Medicine

## 2018-04-07 ENCOUNTER — Ambulatory Visit: Payer: Medicare HMO | Admitting: Oncology

## 2018-04-08 DIAGNOSIS — I5042 Chronic combined systolic (congestive) and diastolic (congestive) heart failure: Secondary | ICD-10-CM | POA: Diagnosis not present

## 2018-04-08 DIAGNOSIS — I130 Hypertensive heart and chronic kidney disease with heart failure and stage 1 through stage 4 chronic kidney disease, or unspecified chronic kidney disease: Secondary | ICD-10-CM | POA: Diagnosis not present

## 2018-04-08 DIAGNOSIS — N183 Chronic kidney disease, stage 3 (moderate): Secondary | ICD-10-CM | POA: Diagnosis not present

## 2018-04-08 DIAGNOSIS — E1122 Type 2 diabetes mellitus with diabetic chronic kidney disease: Secondary | ICD-10-CM | POA: Diagnosis not present

## 2018-04-08 DIAGNOSIS — M545 Low back pain: Secondary | ICD-10-CM | POA: Diagnosis not present

## 2018-04-10 DIAGNOSIS — N183 Chronic kidney disease, stage 3 (moderate): Secondary | ICD-10-CM | POA: Diagnosis not present

## 2018-04-10 DIAGNOSIS — M545 Low back pain: Secondary | ICD-10-CM | POA: Diagnosis not present

## 2018-04-10 DIAGNOSIS — E1122 Type 2 diabetes mellitus with diabetic chronic kidney disease: Secondary | ICD-10-CM | POA: Diagnosis not present

## 2018-04-10 DIAGNOSIS — I5042 Chronic combined systolic (congestive) and diastolic (congestive) heart failure: Secondary | ICD-10-CM | POA: Diagnosis not present

## 2018-04-10 DIAGNOSIS — I130 Hypertensive heart and chronic kidney disease with heart failure and stage 1 through stage 4 chronic kidney disease, or unspecified chronic kidney disease: Secondary | ICD-10-CM | POA: Diagnosis not present

## 2018-04-14 DIAGNOSIS — M545 Low back pain: Secondary | ICD-10-CM | POA: Diagnosis not present

## 2018-04-14 DIAGNOSIS — I5022 Chronic systolic (congestive) heart failure: Secondary | ICD-10-CM | POA: Diagnosis not present

## 2018-04-14 DIAGNOSIS — I5042 Chronic combined systolic (congestive) and diastolic (congestive) heart failure: Secondary | ICD-10-CM | POA: Diagnosis not present

## 2018-04-14 DIAGNOSIS — N183 Chronic kidney disease, stage 3 (moderate): Secondary | ICD-10-CM | POA: Diagnosis not present

## 2018-04-14 DIAGNOSIS — R0602 Shortness of breath: Secondary | ICD-10-CM | POA: Diagnosis not present

## 2018-04-14 DIAGNOSIS — E1122 Type 2 diabetes mellitus with diabetic chronic kidney disease: Secondary | ICD-10-CM | POA: Diagnosis not present

## 2018-04-14 DIAGNOSIS — I130 Hypertensive heart and chronic kidney disease with heart failure and stage 1 through stage 4 chronic kidney disease, or unspecified chronic kidney disease: Secondary | ICD-10-CM | POA: Diagnosis not present

## 2018-04-15 ENCOUNTER — Telehealth (HOSPITAL_COMMUNITY): Payer: Self-pay | Admitting: *Deleted

## 2018-04-15 DIAGNOSIS — I739 Peripheral vascular disease, unspecified: Secondary | ICD-10-CM

## 2018-04-15 NOTE — Telephone Encounter (Signed)
-----   Message from Jolaine Artist, MD sent at 04/14/2018 10:41 PM EDT ----- Please refer back to Dr. Trula Slade

## 2018-04-15 NOTE — Telephone Encounter (Signed)
Notes recorded by Scarlette Calico, RN on 04/15/2018 at 3:10 PM EDT Pt aware and agreeable, referral placed

## 2018-04-17 DIAGNOSIS — E1122 Type 2 diabetes mellitus with diabetic chronic kidney disease: Secondary | ICD-10-CM | POA: Diagnosis not present

## 2018-04-17 DIAGNOSIS — M545 Low back pain: Secondary | ICD-10-CM | POA: Diagnosis not present

## 2018-04-17 DIAGNOSIS — N183 Chronic kidney disease, stage 3 (moderate): Secondary | ICD-10-CM | POA: Diagnosis not present

## 2018-04-17 DIAGNOSIS — I130 Hypertensive heart and chronic kidney disease with heart failure and stage 1 through stage 4 chronic kidney disease, or unspecified chronic kidney disease: Secondary | ICD-10-CM | POA: Diagnosis not present

## 2018-04-17 DIAGNOSIS — I5042 Chronic combined systolic (congestive) and diastolic (congestive) heart failure: Secondary | ICD-10-CM | POA: Diagnosis not present

## 2018-04-21 ENCOUNTER — Other Ambulatory Visit: Payer: Self-pay

## 2018-04-21 DIAGNOSIS — I739 Peripheral vascular disease, unspecified: Secondary | ICD-10-CM

## 2018-04-22 DIAGNOSIS — I130 Hypertensive heart and chronic kidney disease with heart failure and stage 1 through stage 4 chronic kidney disease, or unspecified chronic kidney disease: Secondary | ICD-10-CM | POA: Diagnosis not present

## 2018-04-22 DIAGNOSIS — I5042 Chronic combined systolic (congestive) and diastolic (congestive) heart failure: Secondary | ICD-10-CM | POA: Diagnosis not present

## 2018-04-22 DIAGNOSIS — M545 Low back pain: Secondary | ICD-10-CM | POA: Diagnosis not present

## 2018-04-22 DIAGNOSIS — E1122 Type 2 diabetes mellitus with diabetic chronic kidney disease: Secondary | ICD-10-CM | POA: Diagnosis not present

## 2018-04-22 DIAGNOSIS — N183 Chronic kidney disease, stage 3 (moderate): Secondary | ICD-10-CM | POA: Diagnosis not present

## 2018-04-23 ENCOUNTER — Other Ambulatory Visit (HOSPITAL_COMMUNITY): Payer: Self-pay

## 2018-04-23 MED ORDER — ATORVASTATIN CALCIUM 20 MG PO TABS: 20.0000 mg | ORAL_TABLET | Freq: Every day | ORAL | 9 refills | Status: DC

## 2018-04-24 DIAGNOSIS — I130 Hypertensive heart and chronic kidney disease with heart failure and stage 1 through stage 4 chronic kidney disease, or unspecified chronic kidney disease: Secondary | ICD-10-CM | POA: Diagnosis not present

## 2018-04-24 DIAGNOSIS — N183 Chronic kidney disease, stage 3 (moderate): Secondary | ICD-10-CM | POA: Diagnosis not present

## 2018-04-24 DIAGNOSIS — E1122 Type 2 diabetes mellitus with diabetic chronic kidney disease: Secondary | ICD-10-CM | POA: Diagnosis not present

## 2018-04-24 DIAGNOSIS — M545 Low back pain: Secondary | ICD-10-CM | POA: Diagnosis not present

## 2018-04-24 DIAGNOSIS — I5042 Chronic combined systolic (congestive) and diastolic (congestive) heart failure: Secondary | ICD-10-CM | POA: Diagnosis not present

## 2018-05-01 DIAGNOSIS — N184 Chronic kidney disease, stage 4 (severe): Secondary | ICD-10-CM | POA: Diagnosis not present

## 2018-05-01 DIAGNOSIS — I739 Peripheral vascular disease, unspecified: Secondary | ICD-10-CM | POA: Diagnosis not present

## 2018-05-01 DIAGNOSIS — I5022 Chronic systolic (congestive) heart failure: Secondary | ICD-10-CM | POA: Diagnosis not present

## 2018-05-01 DIAGNOSIS — I129 Hypertensive chronic kidney disease with stage 1 through stage 4 chronic kidney disease, or unspecified chronic kidney disease: Secondary | ICD-10-CM | POA: Diagnosis not present

## 2018-05-01 DIAGNOSIS — E785 Hyperlipidemia, unspecified: Secondary | ICD-10-CM | POA: Diagnosis not present

## 2018-05-14 DIAGNOSIS — I5022 Chronic systolic (congestive) heart failure: Secondary | ICD-10-CM | POA: Diagnosis not present

## 2018-05-14 DIAGNOSIS — R0602 Shortness of breath: Secondary | ICD-10-CM | POA: Diagnosis not present

## 2018-05-15 ENCOUNTER — Other Ambulatory Visit (HOSPITAL_COMMUNITY): Payer: Self-pay | Admitting: Internal Medicine

## 2018-05-28 ENCOUNTER — Ambulatory Visit (HOSPITAL_COMMUNITY)
Admission: RE | Admit: 2018-05-28 | Discharge: 2018-05-28 | Disposition: A | Discharge status: Home / Self Care | Payer: Medicare HMO | Source: Ambulatory Visit | Attending: Internal Medicine | Admitting: Internal Medicine

## 2018-05-28 VITALS — BP 141/47 | HR 65 | Wt 154.2 lb

## 2018-05-28 DIAGNOSIS — R0600 Dyspnea, unspecified: Secondary | ICD-10-CM | POA: Diagnosis not present

## 2018-05-28 DIAGNOSIS — Z79899 Other long term (current) drug therapy: Secondary | ICD-10-CM | POA: Insufficient documentation

## 2018-05-28 DIAGNOSIS — D472 Monoclonal gammopathy: Secondary | ICD-10-CM | POA: Diagnosis not present

## 2018-05-28 DIAGNOSIS — I5042 Chronic combined systolic (congestive) and diastolic (congestive) heart failure: Secondary | ICD-10-CM | POA: Insufficient documentation

## 2018-05-28 DIAGNOSIS — Z7982 Long term (current) use of aspirin: Secondary | ICD-10-CM | POA: Diagnosis not present

## 2018-05-28 DIAGNOSIS — E785 Hyperlipidemia, unspecified: Secondary | ICD-10-CM | POA: Diagnosis not present

## 2018-05-28 DIAGNOSIS — I447 Left bundle-branch block, unspecified: Secondary | ICD-10-CM | POA: Diagnosis not present

## 2018-05-28 DIAGNOSIS — I4891 Unspecified atrial fibrillation: Secondary | ICD-10-CM | POA: Insufficient documentation

## 2018-05-28 DIAGNOSIS — I2510 Atherosclerotic heart disease of native coronary artery without angina pectoris: Secondary | ICD-10-CM | POA: Insufficient documentation

## 2018-05-28 DIAGNOSIS — Z87891 Personal history of nicotine dependence: Secondary | ICD-10-CM | POA: Diagnosis not present

## 2018-05-28 DIAGNOSIS — I080 Rheumatic disorders of both mitral and aortic valves: Secondary | ICD-10-CM | POA: Insufficient documentation

## 2018-05-28 DIAGNOSIS — N183 Chronic kidney disease, stage 3 unspecified: Secondary | ICD-10-CM

## 2018-05-28 DIAGNOSIS — I130 Hypertensive heart and chronic kidney disease with heart failure and stage 1 through stage 4 chronic kidney disease, or unspecified chronic kidney disease: Secondary | ICD-10-CM | POA: Diagnosis not present

## 2018-05-28 DIAGNOSIS — I5022 Chronic systolic (congestive) heart failure: Secondary | ICD-10-CM

## 2018-05-28 DIAGNOSIS — R079 Chest pain, unspecified: Secondary | ICD-10-CM | POA: Insufficient documentation

## 2018-05-28 DIAGNOSIS — I739 Peripheral vascular disease, unspecified: Secondary | ICD-10-CM

## 2018-05-28 DIAGNOSIS — E1122 Type 2 diabetes mellitus with diabetic chronic kidney disease: Secondary | ICD-10-CM | POA: Diagnosis not present

## 2018-05-28 DIAGNOSIS — G4730 Sleep apnea, unspecified: Secondary | ICD-10-CM | POA: Insufficient documentation

## 2018-05-28 LAB — BASIC METABOLIC PANEL
Anion gap: 10 (ref 5–15)
BUN: 34 mg/dL — AB (ref 8–23)
CHLORIDE: 101 mmol/L (ref 98–111)
CO2: 24 mmol/L (ref 22–32)
CREATININE: 1.91 mg/dL — AB (ref 0.44–1.00)
Calcium: 9.6 mg/dL (ref 8.9–10.3)
GFR calc Af Amer: 27 mL/min — ABNORMAL LOW (ref >60)
GFR, EST NON AFRICAN AMERICAN: 24 mL/min — AB (ref >60)
Glucose, Bld: 173 mg/dL — ABNORMAL HIGH (ref 70–99)
Potassium: 4.6 mmol/L (ref 3.5–5.1)
SODIUM: 135 mmol/L (ref 135–145)

## 2018-05-28 MED ORDER — HYDRALAZINE HCL 25 MG PO TABS: 25.0000 mg | ORAL_TABLET | Freq: Two times a day (BID) | ORAL | 3 refills | Status: DC

## 2018-05-28 MED ORDER — HYDRALAZINE HCL 25 MG PO TABS: 25.0000 mg | ORAL_TABLET | Freq: Three times a day (TID) | ORAL | 3 refills | Status: DC

## 2018-05-28 NOTE — Patient Instructions (Addendum)
Increase Hydralazine to 25 mg Twice daily   Lab today  Your physician recommends that you schedule a follow-up appointment in: 2-3 months

## 2018-05-28 NOTE — Progress Notes (Signed)
Advanced Heart Failure Clinic Note    Primary Cardiologist: Dr Vaughan Browner Nephrologist: Dr Hollie Salk  HPI: Krista Mason is a 82 y.o. female with a history of HTN, HL, CKD 3, moderate mitral regurgitation, DM, and chronic systolic heart failure .  Admitted 4/9 with dyspnea. ECHO had gone down to 35-40% (previously 45%). Had RHC with normal filling pressures and cardiac output. Creatinine peaked at 2.1 so lasix was held for few days after discharge. Myeloma panel - 0.3 M protein noted. PYP scan was negative for TTR. Has seen Dr. Jolyn Lent and not felt to have myeloma.   Has had CPX test 02/2018 which showed - submax test with moderate to severe functional limitation due to HF, lung disease and deconditioning. However, markedly elevated Ve/VCO2 slope points to HF as being the predominant limitation and would suggest work-up for advanced therapies if patient is a candidate.   Today she returns for regular follow up. Last visit hydralazine started. Overall doing about the same. Saw nephrology recently with no changes to medications. Working with PT. She does 45 min of chair exercises 5 days/week. Also doing elliptical 10 minutes 2-3 times/week. She is still SOB with minimal exertion. She is in independent living and has been having problems with humidity in her apartment. She now has a dehumidifier that is getting 2 gallons of water daily. She notices worsening SOB when it is not emptied. She has abdominal bloating every other day. Denies peripheral edema. Sleeps sitting up at baseline. No CP or dizziness. She sees Dr Nechama Guard next month because ABIs were abnormal. Weights 152-153 lbs at home. SBP 130s when PT checks it. Takes lasix every other day, has taken extra twice since last visit. She had eaten a BLT both times. Has a hard time remembering afternoon dose of hydralazine, but otherwise taking all meds.   5/19 CPX FVC 1.97 (73%)    FEV1 1.24 (61%)     FEV1/FVC 63 (81%)     MVV 49 (59%)      Resting HR: 58 Peak HR: 110  (78% age predicted max HR) BP rest: 152/66 BP peak: 214/70 Peak VO2: 9.6 (57% predicted peak VO2) VE/VCO2 slope: 50 OUES: 0.87 Peak RER: 0.93 VE/MVV: 70% O2pulse: 6  (75% predicted O2pulse)   01/2018 PYP scan negative for TTR  RHC 01/2018  RA = 3 RV = 29/3 PA = 25/12 (16) PCW = 8 Fick cardiac output/index = 5.3/3.0 PVR = 1.5 WU Ao sat = 97% PA sat = 65%, 67% Assessment: 1. Normal filling pressures and outputs  LHC 01/2017  Heavily calcified but non-obstructive CAD. 50% RCA and 30% LAD.   ECHO 01/2018  EF 35%  Severe concentric LVH with diffuse hypokinesis and paradoxical   septal motion. Thickened aortic and mitral valve leaflets. Mild   aortic regurgitation and moderate mitral regurgitation. Trivial   posteriorly located pericardial effusion.   Evaluation for cardiac amyloidosis should be considered .  ECHO 2018 2018, the LVEF is improved from   35-40% up to 40-45%. There is persistent moderate mitral   regurgitation.  Review of systems complete and found to be negative unless listed in HPI.   SH:  Social History   Socioeconomic History  . Marital status: Widowed    Spouse name: Not on file  . Number of children: Not on file  . Years of education: Not on file  . Highest education level: Not on file  Occupational History  . Not on file  Social Needs  . Financial  resource strain: Not on file  . Food insecurity:    Worry: Not on file    Inability: Not on file  . Transportation needs:    Medical: Not on file    Non-medical: Not on file  Tobacco Use  . Smoking status: Former Smoker    Packs/day: 0.75    Years: 60.00    Pack years: 45.00    Last attempt to quit: 11/04/2016    Years since quitting: 1.5  . Smokeless tobacco: Never Used  Substance and Sexual Activity  . Alcohol use: No  . Drug use: No  . Sexual activity: Not on file  Lifestyle  . Physical activity:    Days per week: Not on file    Minutes per  session: Not on file  . Stress: Not on file  Relationships  . Social connections:    Talks on phone: Not on file    Gets together: Not on file    Attends religious service: Not on file    Active member of club or organization: Not on file    Attends meetings of clubs or organizations: Not on file    Relationship status: Not on file  . Intimate partner violence:    Fear of current or ex partner: Not on file    Emotionally abused: Not on file    Physically abused: Not on file    Forced sexual activity: Not on file  Other Topics Concern  . Not on file  Social History Narrative  . Not on file    FH:  Family History  Problem Relation Age of Onset  . Heart attack Father   . Heart disease Father   . Cancer Father   . Hypertension Father   . Hypertension Brother   . Stroke Brother   . Hypertension Brother   . Diabetes Son   . Hyperlipidemia Son   . Hypertension Son     Past Medical History:  Diagnosis Date  . Atrial fibrillation ([place])   . Chronic combined systolic and diastolic CHF (congestive heart failure) ([place])    a. LV dysfcuntion felt to be related to PVCs  . Chronic kidney disease   . Diabetes mellitus without complication ([place])   . Hx of echocardiogram    Echo (1/16):  EF 60-65%, no RWMA, Gr 1 DD, mod AI, mod MR, mild LAE  . Hyperkalemia   . Hyperlipidemia   . Hypertension   . Orthostatic hypotension   . PVC (premature ventricular contraction)    a. s/p PVC ablation in 11/2016  . Rheumatic fever   . Sleep apnea     Current Outpatient Medications  Medication Sig Dispense Refill  . acetaminophen (TYLENOL) 325 MG tablet Take 325 mg by mouth every 6 (six) hours as needed for mild pain.     [name] aspirin 81 MG tablet Take 1 tablet (81 mg total) by mouth daily. 30 tablet 11  . atorvastatin (LIPITOR) 20 MG tablet Take 1 tablet (20 mg total) by mouth daily. 30 tablet 9  . carvedilol (COREG) 6.25 MG tablet Take 1.5 tablets (9.375 mg total) by mouth 2 (two) times daily.  270 tablet 3  . cholecalciferol (VITAMIN D) 1000 UNITS tablet Take 1,000 Units by mouth daily.     . ferrous gluconate (FERGON) 324 MG tablet Take 324 mg by mouth every other day.    . furosemide (LASIX) 40 MG tablet Take 1 tablet (40 mg total) by mouth every other day. 30 tablet 6  .  hydrALAZINE (APRESOLINE) 25 MG tablet Take 0.5 tablets (12.5 mg total) by mouth 3 (three) times daily. 45 tablet 3  . losartan (COZAAR) 50 MG tablet TAKE 1 TABLET BY MOUTH EVERY DAY 30 tablet 3  . omeprazole (PRILOSEC) 40 MG capsule Take 40 mg by mouth daily.     Vladimir Faster Glycol-Propyl Glycol (SYSTANE OP) Place 1 drop into both eyes 2 (two) times daily as needed (dryness).     No current facility-administered medications for this encounter.     Vitals:   05/28/18 0905  BP: (!) 141/47  Pulse: 65  SpO2: 100%  Weight: 154 lb 3.2 oz (69.9 kg)   Filed Weights   05/28/18 0905  Weight: 154 lb 3.2 oz (69.9 kg)   PHYSICAL EXAM: General: Elderly. No resp difficulty. HEENT: Normal Neck: Supple. JVP 6-7. Carotids 2+ bilat; no bruits. No thyromegaly or nodule noted. Cor: PMI nondisplaced. RRR, No M/G/R noted Lungs: CTAB, normal effort. Abdomen: Soft, non-tender, non-distended, no HSM. No bruits or masses. +BS  Extremities: No cyanosis, clubbing, or rash. R and LLE no edema. BLE venous cyanosis. Neuro: Alert & orientedx3, cranial nerves grossly intact. moves all 4 extremities w/o difficulty. Affect pleasant  ASSESSMENT & PLAN:  1. Dyspnea and exercise intolerance - EF 35-40% by echo  - volume status low and cardiac output normal on RHC - chest CT with mild scarring from histo. VQ negative - CPX test shows moderate to severe exercise limitation due to a combination of HF, PAD, deconditinoing and lung disease  - Based on all available data I am not sure that her CHF is the major issue here though it is certainly playing a role.  - Given age and comorbidities she is not candidate for VAD or transplant. Only  real option would be CRT-P. She would like to see what Dr Nechama Guard can do for her before considering CRT-P.  - Encouraged her to follow up with apartment regarding humidity problems   2. Chest pain - Last cath 01/2017 with heavily calcified but non-obstructive CAD. 50% RCA and 30% LAD. -CT chest stable with old granulomatous disease. - Continue ASA and statin - No further CP  2.Chronic combined CHF: NICM s/p PVC ablation. Now with LBBB. Echo8/18EF 40% (unchanged from prior). - Echo 01/2018 LVEF 35-40%, Mod/Sev MR, LVH. Concern for amyloid.  -PYP scan negative for TTR amyloid. Has MGUS and has seen Dr. Beryle Beams but low suspicion for AL amyloid/myeloma. 24 hour urine with no Mspike - Volume status okay. Weight up 1 lb from last visit. Continue lasix 40 mg every other day.  - Continue coreg 9.375 mg twice a day. - Continue losartan 50 mg bid. Creatinine still above baseline, so will not start Entresto today. BMET today. - Increase hydralazine to 25 mg BID. Unable to remember TID  3. PVCs - s/p ablation. Off Amiodarone therapy. PVC burden 2.1%01/2017. No change.   4. LBBB - EF 35-40% 01/2018. May need CRT-P if symptoms not improving  5. Drop attacks/syncope Resolved.  - Denies any recent occurrences. 30 day monitor unremarkable 10/2017.  6.CKD III - Baseline creatinine 1.5-1.6.  - Creatinine 1.76 02/2018 - Following with Dr Hollie Salk - BMET today  7. HTN - Elevated today. - Increase hydralazine to 25 mg BID  8. PAD - ABIs showed moderate disease BLE. Sees Dr Trula Slade next month.   BMET today Increase hydralazine to 25 mg BID Follow up 2-3 months with Dr Haroldine Laws  Georgiana Shore, NP 9:11 AM  Greater than 50%  of the 25 minute visit was spent in counseling/coordination of care regarding disease state education, salt/fluid restriction, sliding scale diuretics, and medication compliance.

## 2018-05-29 ENCOUNTER — Telehealth (HOSPITAL_COMMUNITY): Payer: Self-pay | Admitting: *Deleted

## 2018-05-29 NOTE — Telephone Encounter (Signed)
Called pt, per her request, and let her know labs from 05/28/18 were stable per Lillia Mountain, NP, pt thankful for call

## 2018-06-09 ENCOUNTER — Other Ambulatory Visit (HOSPITAL_COMMUNITY): Payer: Self-pay | Admitting: Internal Medicine

## 2018-06-14 DIAGNOSIS — R0602 Shortness of breath: Secondary | ICD-10-CM | POA: Diagnosis not present

## 2018-06-14 DIAGNOSIS — I5022 Chronic systolic (congestive) heart failure: Secondary | ICD-10-CM | POA: Diagnosis not present

## 2018-06-17 DIAGNOSIS — R69 Illness, unspecified: Secondary | ICD-10-CM | POA: Diagnosis not present

## 2018-06-30 ENCOUNTER — Ambulatory Visit (INDEPENDENT_AMBULATORY_CARE_PROVIDER_SITE_OTHER): Payer: Medicare HMO | Admitting: Surgery

## 2018-06-30 ENCOUNTER — Other Ambulatory Visit: Payer: Self-pay

## 2018-06-30 ENCOUNTER — Encounter: Payer: Self-pay | Admitting: Surgery

## 2018-06-30 VITALS — BP 151/69 | HR 63 | Temp 98.3°F | Resp 16 | Ht 64.0 in | Wt 154.0 lb

## 2018-06-30 DIAGNOSIS — I739 Peripheral vascular disease, unspecified: Secondary | ICD-10-CM

## 2018-06-30 NOTE — Progress Notes (Signed)
Vitals:   06/30/18 1146  BP: (!) 160/71  Pulse: 63  Resp: 16  Temp: 98.3 F (36.8 C)  TempSrc: Oral  SpO2: 98%  Weight: 154 lb (69.9 kg)  Height: 5\' 4"  (1.626 m)

## 2018-06-30 NOTE — Progress Notes (Signed)
[facility]  Patient name: Krista Mason MRN: [id number] DOB: 10-May-1936 Sex: female   REASON FOR VISIT:    Follow up  HISOTRY OF PRESENT ILLNESS:    Krista Mason is a 82 y.o. female whom I last saw in 2016.  I initially met her in 2014 when she was having issues with numbness in her left leg.  She has had vascular insufficiency in the past, however her symptoms did not warrant intervention.  She is back again today with progression of her symptoms.  She states that she began getting worse about 1 year ago.  She can now walk approximately 150 feet.  She gets burning and cramping in her legs, and they feel very heavy.  The right leg is more severe than the left.  Resting does help improve her symptoms.  She does not have any open wounds.  She does not have rest pain.  The patient does walk with the assistance of a walker.  She feels her walking is more limited by her legs than her shortness of breath.  Patient is managed in the heart failure clinic.  Her ejection fraction is 30-40% by echo.  She has stable chest pain and a nonobstructive cath most recently in 2018.  She has chronic renal insufficiency with a baseline creatinine around 1.7.  She is medically managed for hypertension.  She is on a statin for hypercholesterolemia.   PAST MEDICAL HISTORY:   Past Medical History:  Diagnosis Date  . Atrial fibrillation ([place])   . Chronic combined systolic and diastolic CHF (congestive heart failure) ([place])    a. LV dysfcuntion felt to be related to PVCs  . Chronic kidney disease   . Diabetes mellitus without complication ([place])   . Hx of echocardiogram    Echo (1/16):  EF 60-65%, no RWMA, Gr 1 DD, mod AI, mod MR, mild LAE  . Hyperkalemia   . Hyperlipidemia   . Hypertension   . Orthostatic hypotension   . PVC (premature ventricular contraction)    a. s/p PVC ablation in 11/2016  . Rheumatic fever   . Sleep apnea       FAMILY HISTORY:   Family History  Problem Relation Age of Onset  . Heart attack Father   . Heart disease Father   . Cancer Father   . Hypertension Father   . Hypertension Brother   . Stroke Brother   . Hypertension Brother   . Diabetes Son   . Hyperlipidemia Son   . Hypertension Son     SOCIAL HISTORY:   Social History   Tobacco Use  . Smoking status: Former Smoker    Packs/day: 0.75    Years: 60.00    Pack years: 45.00    Last attempt to quit: 11/04/2016    Years since quitting: 1.6  . Smokeless tobacco: Never Used  Substance Use Topics  . Alcohol use: No     ALLERGIES:   Allergies  Allergen Reactions  . Penicillins Itching and Rash    Amoxicillin ok- IM pen is what gives reaction  Has patient had a PCN reaction causing immediate rash, facial/tongue/throat swelling, SOB or lightheadedness with hypotension: Yes Has patient had a PCN reaction causing severe rash involving mucus membranes or skin necrosis: No Has patient had a PCN reaction that required hospitalization: No Has patient had a PCN reaction occurring within the last 10 years: No If all of the above answers are "NO", then may proceed  with Cephalosporin use.  [name] [Cefaclor] Itching  . Crestor [Rosuvastatin Calcium] Nausea Only  . Epinephrine Other (See Comments)    "feels like heart is beating out of chest"  . Nsaids     Pt states she is not taking because of kidney function  . Other Other (See Comments)    FLU SHOT-swelling, redness, fever  . Percocet [Oxycodone-Acetaminophen] Nausea And Vomiting  . Prednisone     Dizziness, spiked blood sugar     CURRENT MEDICATIONS:   Current Outpatient Medications  Medication Sig Dispense Refill  . acetaminophen (TYLENOL) 325 MG tablet Take 325 mg by mouth every 6 (six) hours as needed for mild pain.     [name] aspirin 81 MG tablet Take 1 tablet (81 mg total) by mouth daily. 30 tablet 11  . atorvastatin (LIPITOR) 20 MG tablet Take 1 tablet (20 mg  total) by mouth daily. 30 tablet 9  . carvedilol (COREG) 6.25 MG tablet Take 1.5 tablets (9.375 mg total) by mouth 2 (two) times daily. 270 tablet 3  . cholecalciferol (VITAMIN D) 1000 UNITS tablet Take 1,000 Units by mouth daily.     . furosemide (LASIX) 40 MG tablet Take 1 tablet (40 mg total) by mouth every other day. 30 tablet 6  . hydrALAZINE (APRESOLINE) 25 MG tablet Take 1 tablet (25 mg total) by mouth 2 (two) times daily. 60 tablet 3  . losartan (COZAAR) 50 MG tablet TAKE 1 TABLET BY MOUTH EVERY DAY 30 tablet 3  . omeprazole (PRILOSEC) 40 MG capsule Take 20 mg by mouth daily.     Vladimir Faster Glycol-Propyl Glycol (SYSTANE OP) Place 1 drop into both eyes 2 (two) times daily as needed (dryness).    . ferrous gluconate (FERGON) 324 MG tablet Take 324 mg by mouth every other day.     No current facility-administered medications for this visit.     REVIEW OF SYSTEMS:   '[X]'$  denotes positive finding, '[ ]'$  denotes negative finding Cardiac  Comments:  Chest pain or chest pressure: x   Shortness of breath upon exertion: x   Short of breath when lying flat:    Irregular heart rhythm: x       Vascular    Pain in calf, thigh, or hip brought on by ambulation: x   Pain in feet at night that wakes you up from your sleep:     Blood clot in your veins:    Leg swelling:         Pulmonary    Oxygen at home: x   Productive cough:     Wheezing:         Neurologic    Sudden weakness in arms or legs:     Sudden numbness in arms or legs:     Sudden onset of difficulty speaking or slurred speech:    Temporary loss of vision in one eye:     Problems with dizziness:         Gastrointestinal    Blood in stool:     Vomited blood:         Genitourinary    Burning when urinating:     Blood in urine:        Psychiatric    Major depression:         Hematologic    Bleeding problems:    Problems with blood clotting too easily:        Skin    Rashes or ulcers:  Constitutional     Fever or chills:      PHYSICAL EXAM:   Vitals:   06/30/18 1146 06/30/18 1156  BP: (!) 160/71 (!) 151/69  Pulse: 63 63  Resp: 16   Temp: 98.3 F (36.8 C)   TempSrc: Oral   SpO2: 98%   Weight: 154 lb (69.9 kg)   Height: '5\' 4"'$  (1.626 m)     GENERAL: The patient is a well-nourished female, in no acute distress. The vital signs are documented above. CARDIAC: There is a regular rate and rhythm.  VASCULAR: palpable femoral pulses PULMONARY: Non-labored respirations ABDOMEN: Soft and non-tender with normal pitched bowel sounds.  MUSCULOSKELETAL: There are no major deformities or cyanosis. NEUROLOGIC: No focal weakness or paresthesias are detected. SKIN: There are no ulcers or rashes noted. PSYCHIATRIC: The patient has a normal affect.  STUDIES:   ABI/TBIToday's ABIToday's TBIPrevious ABIPrevious TBI +-------+-----------+-----------+------------+------------+ Right 0.53    0.22    0.86    0.49     +-------+-----------+-----------+------------+------------+ Left  0.68    0.39    0.53    0.42     +-------+-----------+-----------+------------+------------+   Right: >50% stenosis of the right common iliac artery.    >50 stenosis of the right external iliac artery.    Possible occlusion of the distal superficial femoral artery,    with collateral reconstitution of the above-knee popliteal    artery. Heavily calcified plaque with poor insonation in this    area.  Left: >50% stenosis of the left external iliac artery. Unable to   rule out left common iliac artery stenosis due to poor   visualization and bowel gas.    30-49% stenosis noted in the common femoral artery. 50-74%  MEDICAL ISSUES:   Atherosclerotic vascular disease with intermittent claudication: I had a lengthy discussion with the patient today regarding our options.  Currently, I am not sure how much quality of life improvement she  will get from intervening on her iliac stenosis.  She does describe claudication symptoms, however she also gets short of breath with activity.  Her vascular disease does appear to limit her.  She wants to walk more but states that she can do her daily activities without significant difficulty.  She is not a candidate for cilostazol because of her heart failure.  I stressed the importance of an exercise program.  She is going to contemplate how much her legs bother her on a daily basis and determine whether or not she wants to proceed with angiography.  This will need to be done with a combination of CO2 and IV contrast because of her renal insufficiency.  We discussed the patency rates associated with iliac intervention.  She will contact the office if she wants to have this scheduled.    Annamarie Major, MD [facility] [phone] Pager [phone]

## 2018-07-15 DIAGNOSIS — R0602 Shortness of breath: Secondary | ICD-10-CM | POA: Diagnosis not present

## 2018-07-15 DIAGNOSIS — I5022 Chronic systolic (congestive) heart failure: Secondary | ICD-10-CM | POA: Diagnosis not present

## 2018-07-23 ENCOUNTER — Telehealth (HOSPITAL_COMMUNITY): Payer: Self-pay

## 2018-07-23 NOTE — Telephone Encounter (Signed)
Pt notified and verbalizes understanding.

## 2018-07-23 NOTE — Telephone Encounter (Signed)
Please double lasix for 2 days and see if it helps

## 2018-07-23 NOTE — Telephone Encounter (Signed)
Pt called and stated that she is having SOB. Pt reports having SOB for the past 2-3 weeks and it is getting worst. Pt also reported having swelling in her abdomen with 3 lb weight gain in last 3 weeks. Pt is currently taking 1 (40 mg) lasix every other day. Please advise.

## 2018-07-30 ENCOUNTER — Encounter (HOSPITAL_COMMUNITY): Payer: Self-pay | Admitting: Internal Medicine

## 2018-07-30 ENCOUNTER — Ambulatory Visit (HOSPITAL_COMMUNITY)
Admission: RE | Admit: 2018-07-30 | Discharge: 2018-07-30 | Disposition: A | Discharge status: Home / Self Care | Payer: Medicare HMO | Source: Ambulatory Visit | Attending: Internal Medicine | Admitting: Internal Medicine

## 2018-07-30 ENCOUNTER — Other Ambulatory Visit: Payer: Self-pay

## 2018-07-30 VITALS — BP 142/47 | HR 70 | Wt 154.2 lb

## 2018-07-30 DIAGNOSIS — N183 Chronic kidney disease, stage 3 (moderate): Secondary | ICD-10-CM | POA: Diagnosis not present

## 2018-07-30 DIAGNOSIS — I340 Nonrheumatic mitral (valve) insufficiency: Secondary | ICD-10-CM | POA: Diagnosis not present

## 2018-07-30 DIAGNOSIS — R0602 Shortness of breath: Secondary | ICD-10-CM | POA: Insufficient documentation

## 2018-07-30 DIAGNOSIS — I130 Hypertensive heart and chronic kidney disease with heart failure and stage 1 through stage 4 chronic kidney disease, or unspecified chronic kidney disease: Secondary | ICD-10-CM | POA: Insufficient documentation

## 2018-07-30 DIAGNOSIS — I25119 Atherosclerotic heart disease of native coronary artery with unspecified angina pectoris: Secondary | ICD-10-CM | POA: Insufficient documentation

## 2018-07-30 DIAGNOSIS — Z87891 Personal history of nicotine dependence: Secondary | ICD-10-CM | POA: Insufficient documentation

## 2018-07-30 DIAGNOSIS — I2510 Atherosclerotic heart disease of native coronary artery without angina pectoris: Secondary | ICD-10-CM | POA: Diagnosis not present

## 2018-07-30 DIAGNOSIS — I5022 Chronic systolic (congestive) heart failure: Secondary | ICD-10-CM

## 2018-07-30 DIAGNOSIS — Z7982 Long term (current) use of aspirin: Secondary | ICD-10-CM | POA: Insufficient documentation

## 2018-07-30 DIAGNOSIS — I428 Other cardiomyopathies: Secondary | ICD-10-CM | POA: Insufficient documentation

## 2018-07-30 DIAGNOSIS — R001 Bradycardia, unspecified: Secondary | ICD-10-CM | POA: Diagnosis not present

## 2018-07-30 DIAGNOSIS — I5042 Chronic combined systolic (congestive) and diastolic (congestive) heart failure: Secondary | ICD-10-CM | POA: Diagnosis not present

## 2018-07-30 DIAGNOSIS — I739 Peripheral vascular disease, unspecified: Secondary | ICD-10-CM | POA: Insufficient documentation

## 2018-07-30 DIAGNOSIS — I493 Ventricular premature depolarization: Secondary | ICD-10-CM | POA: Diagnosis not present

## 2018-07-30 DIAGNOSIS — Z79899 Other long term (current) drug therapy: Secondary | ICD-10-CM | POA: Insufficient documentation

## 2018-07-30 DIAGNOSIS — Z8249 Family history of ischemic heart disease and other diseases of the circulatory system: Secondary | ICD-10-CM | POA: Diagnosis not present

## 2018-07-30 DIAGNOSIS — G4730 Sleep apnea, unspecified: Secondary | ICD-10-CM | POA: Diagnosis not present

## 2018-07-30 DIAGNOSIS — I4891 Unspecified atrial fibrillation: Secondary | ICD-10-CM | POA: Diagnosis not present

## 2018-07-30 DIAGNOSIS — E1122 Type 2 diabetes mellitus with diabetic chronic kidney disease: Secondary | ICD-10-CM | POA: Diagnosis not present

## 2018-07-30 DIAGNOSIS — E785 Hyperlipidemia, unspecified: Secondary | ICD-10-CM | POA: Insufficient documentation

## 2018-07-30 DIAGNOSIS — I447 Left bundle-branch block, unspecified: Secondary | ICD-10-CM | POA: Insufficient documentation

## 2018-07-30 DIAGNOSIS — I440 Atrioventricular block, first degree: Secondary | ICD-10-CM | POA: Insufficient documentation

## 2018-07-30 LAB — BASIC METABOLIC PANEL
Anion gap: 8 (ref 5–15)
BUN: 40 mg/dL — ABNORMAL HIGH (ref 8–23)
CHLORIDE: 104 mmol/L (ref 98–111)
CO2: 24 mmol/L (ref 22–32)
CREATININE: 2.34 mg/dL — AB (ref 0.44–1.00)
Calcium: 9.8 mg/dL (ref 8.9–10.3)
GFR, EST AFRICAN AMERICAN: 21 mL/min — AB (ref >60)
GFR, EST NON AFRICAN AMERICAN: 19 mL/min — AB (ref >60)
Glucose, Bld: 167 mg/dL — ABNORMAL HIGH (ref 70–99)
Potassium: 4.6 mmol/L (ref 3.5–5.1)
Sodium: 136 mmol/L (ref 135–145)

## 2018-07-30 LAB — BRAIN NATRIURETIC PEPTIDE: B Natriuretic Peptide: 362.4 pg/mL — ABNORMAL HIGH (ref 0.0–100.0)

## 2018-07-30 MED ORDER — FUROSEMIDE 40 MG PO TABS: 40.0000 mg | ORAL_TABLET | Freq: Every day | ORAL | 6 refills | Status: DC

## 2018-07-30 NOTE — Progress Notes (Signed)
Advanced Heart Failure Clinic Note    Primary Cardiologist: Dr Vaughan Browner Nephrologist: Dr Hollie Salk  HPI: Shaquanna Lycan Distel is a 82 y.o. female with a history of HTN, HL, CKD 3, moderate mitral regurgitation, DM, and chronic systolic heart failure .  Admitted 01/28/18 with dyspnea. ECHO had gone down to 35-40% (previously 45%). Had RHC with normal filling pressures and cardiac output. Creatinine peaked at 2.1 so lasix was held for few days after discharge. Myeloma panel - 0.3 M protein noted. PYP scan was negative for TTR. Has seen Dr. Beryle Beams and not felt to have myeloma.   Has had CPX test 02/2018 which showed - submax test with moderate to severe functional limitation due to HF, lung disease and deconditioning. However, markedly elevated Ve/VCO2 slope points to HF as being the predominant limitation and would suggest work-up for advanced therapies if patient is a candidate.   Today she returns for regular follow up. Says she is doing ok. Still doing chair exercises but gets very SOB with them or ADls such as showering. Last week she weight was up and took extra lasix. Weight back down 5 pounds but not back to baseline. Taking lasix 40mg  every other day but took lasix 40 bid for 2 days. Very careful with salt and fluid intake. + orthopnea or bendopnea. No CP. Saw Dr. Trula Slade and had non-obstructive PAD. Weight at 152. Creatinine runs 1.7-1.9. Still with episodes of chest pressure. SBP at home 130-140.   5/19 CPX FVC 1.97 (73%)    FEV1 1.24 (61%)     FEV1/FVC 63 (81%)     MVV 49 (59%)     Resting HR: 58 Peak HR: 110  (78% age predicted max HR) BP rest: 152/66 BP peak: 214/70 Peak VO2: 9.6 (57% predicted peak VO2) VE/VCO2 slope: 50 OUES: 0.87 Peak RER: 0.93 VE/MVV: 70% O2pulse: 6  (75% predicted O2pulse)   01/2018 PYP scan negative for TTR  RHC 01/2018  RA = 3 RV = 29/3 PA = 25/12 (16) PCW = 8 Fick cardiac output/index = 5.3/3.0 PVR = 1.5 WU Ao sat = 97% PA sat =  65%, 67% Assessment: 1. Normal filling pressures and outputs  LHC 01/2017  Heavily calcified but non-obstructive CAD. 50% RCA and 30% LAD.   ECHO 01/2018  EF 35%  Severe concentric LVH with diffuse hypokinesis and paradoxical   septal motion. Thickened aortic and mitral valve leaflets. Mild   aortic regurgitation and moderate mitral regurgitation. Trivial   posteriorly located pericardial effusion.   Evaluation for cardiac amyloidosis should be considered .  ECHO 2018 2018, the LVEF is improved from   35-40% up to 40-45%. There is persistent moderate mitral   regurgitation.  Review of systems complete and found to be negative unless listed in HPI.   SH:  Social History   Socioeconomic History  . Marital status: Widowed    Spouse name: Not on file  . Number of children: Not on file  . Years of education: Not on file  . Highest education level: Not on file  Occupational History  . Not on file  Social Needs  . Financial resource strain: Not on file  . Food insecurity:    Worry: Not on file    Inability: Not on file  . Transportation needs:    Medical: Not on file    Non-medical: Not on file  Tobacco Use  . Smoking status: Former Smoker    Packs/day: 0.75    Years: 60.00  Pack years: 45.00    Last attempt to quit: 11/04/2016    Years since quitting: 1.7  . Smokeless tobacco: Never Used  Substance and Sexual Activity  . Alcohol use: No  . Drug use: No  . Sexual activity: Not on file  Lifestyle  . Physical activity:    Days per week: Not on file    Minutes per session: Not on file  . Stress: Not on file  Relationships  . Social connections:    Talks on phone: Not on file    Gets together: Not on file    Attends religious service: Not on file    Active member of club or organization: Not on file    Attends meetings of clubs or organizations: Not on file    Relationship status: Not on file  . Intimate partner violence:    Fear of current or ex partner: Not  on file    Emotionally abused: Not on file    Physically abused: Not on file    Forced sexual activity: Not on file  Other Topics Concern  . Not on file  Social History Narrative  . Not on file    FH:  Family History  Problem Relation Age of Onset  . Heart attack Father   . Heart disease Father   . Cancer Father   . Hypertension Father   . Hypertension Brother   . Stroke Brother   . Hypertension Brother   . Diabetes Son   . Hyperlipidemia Son   . Hypertension Son     Past Medical History:  Diagnosis Date  . Atrial fibrillation ([place])   . Chronic combined systolic and diastolic CHF (congestive heart failure) ([place])    a. LV dysfcuntion felt to be related to PVCs  . Chronic kidney disease   . Diabetes mellitus without complication ([place])   . Hx of echocardiogram    Echo (1/16):  EF 60-65%, no RWMA, Gr 1 DD, mod AI, mod MR, mild LAE  . Hyperkalemia   . Hyperlipidemia   . Hypertension   . Orthostatic hypotension   . PVC (premature ventricular contraction)    a. s/p PVC ablation in 11/2016  . Rheumatic fever   . Sleep apnea     Current Outpatient Medications  Medication Sig Dispense Refill  . acetaminophen (TYLENOL) 325 MG tablet Take 325 mg by mouth every 6 (six) hours as needed for mild pain.     [name] aspirin 81 MG tablet Take 1 tablet (81 mg total) by mouth daily. 30 tablet 11  . atorvastatin (LIPITOR) 20 MG tablet Take 1 tablet (20 mg total) by mouth daily. 30 tablet 9  . carvedilol (COREG) 6.25 MG tablet Take 1.5 tablets (9.375 mg total) by mouth 2 (two) times daily. 270 tablet 3  . cholecalciferol (VITAMIN D) 1000 UNITS tablet Take 1,000 Units by mouth daily.     . furosemide (LASIX) 40 MG tablet Take 1 tablet (40 mg total) by mouth every other day. 30 tablet 6  . hydrALAZINE (APRESOLINE) 25 MG tablet Take 1 tablet (25 mg total) by mouth 2 (two) times daily. 60 tablet 3  . losartan (COZAAR) 50 MG tablet TAKE 1 TABLET BY MOUTH EVERY DAY 30 tablet 3  . omeprazole  (PRILOSEC) 40 MG capsule Take 20 mg by mouth daily.     Vladimir Faster Glycol-Propyl Glycol (SYSTANE OP) Place 1 drop into both eyes 2 (two) times daily as needed (dryness).     No current facility-administered  medications for this encounter.     Vitals:   07/30/18 0859  BP: (!) 142/47  Pulse: 70  SpO2: 100%  Weight: 69.9 kg (154 lb 3.2 oz)   Filed Weights   07/30/18 0859  Weight: 69.9 kg (154 lb 3.2 oz)   PHYSICAL EXAM: General:  Elderly No resp difficulty HEENT: normal Neck: supple. JVP 6  Carotids 2+ bilat; no bruits. No lymphadenopathy or thryomegaly appreciated. Cor: PMI nondisplaced. Regular rate & rhythm. No rubs, gallops or murmurs. Lungs: clear but moderately decreased throughout  No wheeze.  Abdomen: soft, nontender, mildly distended. No hepatosplenomegaly. No bruits or masses. Good bowel sounds. Extremities: no cyanosis, clubbing, rash, edema Neuro: alert & orientedx3, cranial nerves grossly intact. moves all 4 extremities w/o difficulty. Affect pleasant   ReDS Vest - 07/30/18 1000      ReDS Vest   MR   No    Fitting Posture  Sitting    Ruler Value  28    Center Strip  Aligned    ReDS Value  26       ECG:  Sbrady 52 1AVB 242 ms LBBB 142 ms .dbps    ASSESSMENT & PLAN:   1.Chronic combined CHF: NICM s/p PVC ablation. Now with LBBB. Echo8/18EF 40% (unchanged from prior). - volume status low and cardiac output normal on RHC in 4/19 - Echo 01/2018 LVEF 35-40%, Mod/Sev MR, LVH. Concern for amyloid.  -PYP scan negative for TTR amyloid. Has MGUS and has seen Dr. Beryle Beams but low suspicion for AL amyloid/myeloma. 24 hour urine with no Mspike - Continues with NYHA IIIB symptoms. On exam volume looked up with mild abdominal distension but REDS reading actually on low end of normal. So volume looks fine on current diuretic dosing.  - Will increase lasix to 40 daily to see if this will help keep fluid values stable. Repeat labs in 7-10 days  - CPX suggests severe  multifactorial limitation but HF predominates. Unfortunately, not candidate for advanced therapies due to age and comorbidities.  - Can consider possible CRT. Will discuss with EP. If not, can also consider trial of palliative home inotropes as needed.  - Continue coreg 9.375 mg twice a day. - Continue losartan 50 mg bid.  - Continue hydralazine to 25 mg BID. Unable to remember TID - Check labs   2. Chest pain - Last cath 01/2017 with heavily calcified but non-obstructive CAD. 50% RCA and 30% LAD. -CT chest stable with old granulomatous disease. - Continue ASA and statin - Still with intermittent CP - she is s/p esophageal dilations with Dr. Cristina Gong   3. PVCs - s/p ablation. Off Amiodarone therapy. PVC burden 2.1%01/2017. No change.   4. LBBB - ECG QRS 157ms - EF 35-40% 01/2018. May need CRT-P if symptoms not improving  5. Drop attacks/syncope Resolved.  - Denies any recent occurrences. 30 day monitor unremarkable 10/2017.  6.CKD III - Baseline creatinine 1.5-1.6.  - Creatinine 1.9 on 8/7 - Following with Dr Hollie Salk - BMET today and in 7-10 days  7. HTN - Elevated today. - Increase hydralazine to 25 mg BID  8. PAD - ABIs showed moderate disease BLE. Has seen [facility]. Medical therapy for now.   Total time spent 35 minutes. Over half that time spent discussing above.    Glori Bickers, MD 9:22 AM

## 2018-07-30 NOTE — Progress Notes (Addendum)
ReDS Vest - 07/30/18 1000      ReDS Vest   MR   No    Fitting Posture  Sitting    Ruler Value  28    Center Strip  Aligned    ReDS Value  26        Clip reading

## 2018-07-30 NOTE — Patient Instructions (Signed)
Increase Lasix to 40mg  daily.  Routine lab work today. Will notify you of abnormal results  Repeat labs in 7-10 days.   Follow up with Dr.Bensimhon in 2 months.

## 2018-07-31 ENCOUNTER — Telehealth (HOSPITAL_COMMUNITY): Payer: Self-pay

## 2018-07-31 NOTE — Telephone Encounter (Signed)
Pt has appointment next week to have lab work done with there PCP

## 2018-08-05 DIAGNOSIS — N184 Chronic kidney disease, stage 4 (severe): Secondary | ICD-10-CM | POA: Diagnosis not present

## 2018-08-05 DIAGNOSIS — R809 Proteinuria, unspecified: Secondary | ICD-10-CM | POA: Diagnosis not present

## 2018-08-05 DIAGNOSIS — E119 Type 2 diabetes mellitus without complications: Secondary | ICD-10-CM | POA: Diagnosis not present

## 2018-08-05 DIAGNOSIS — E559 Vitamin D deficiency, unspecified: Secondary | ICD-10-CM | POA: Diagnosis not present

## 2018-08-05 DIAGNOSIS — E785 Hyperlipidemia, unspecified: Secondary | ICD-10-CM | POA: Diagnosis not present

## 2018-08-05 DIAGNOSIS — I10 Essential (primary) hypertension: Secondary | ICD-10-CM | POA: Diagnosis not present

## 2018-08-05 DIAGNOSIS — D649 Anemia, unspecified: Secondary | ICD-10-CM | POA: Diagnosis not present

## 2018-08-08 ENCOUNTER — Other Ambulatory Visit (HOSPITAL_COMMUNITY): Payer: Self-pay | Admitting: Internal Medicine

## 2018-08-14 DIAGNOSIS — I5022 Chronic systolic (congestive) heart failure: Secondary | ICD-10-CM | POA: Diagnosis not present

## 2018-08-14 DIAGNOSIS — R0602 Shortness of breath: Secondary | ICD-10-CM | POA: Diagnosis not present

## 2018-08-20 ENCOUNTER — Other Ambulatory Visit (HOSPITAL_COMMUNITY): Payer: Self-pay | Admitting: Internal Medicine

## 2018-08-26 ENCOUNTER — Other Ambulatory Visit: Payer: Self-pay | Admitting: Gastroenterology

## 2018-08-26 ENCOUNTER — Other Ambulatory Visit (HOSPITAL_COMMUNITY): Payer: Self-pay | Admitting: Internal Medicine

## 2018-08-26 DIAGNOSIS — K210 Gastro-esophageal reflux disease with esophagitis: Secondary | ICD-10-CM | POA: Diagnosis not present

## 2018-08-26 DIAGNOSIS — R1319 Other dysphagia: Secondary | ICD-10-CM

## 2018-08-26 DIAGNOSIS — R1310 Dysphagia, unspecified: Secondary | ICD-10-CM | POA: Diagnosis not present

## 2018-08-26 DIAGNOSIS — K222 Esophageal obstruction: Secondary | ICD-10-CM | POA: Diagnosis not present

## 2018-09-02 ENCOUNTER — Ambulatory Visit
Admission: RE | Admit: 2018-09-02 | Discharge: 2018-09-02 | Disposition: A | Discharge status: Home / Self Care | Payer: Medicare HMO | Source: Ambulatory Visit | Attending: Gastroenterology | Admitting: Gastroenterology

## 2018-09-02 DIAGNOSIS — R1310 Dysphagia, unspecified: Secondary | ICD-10-CM

## 2018-09-02 DIAGNOSIS — K219 Gastro-esophageal reflux disease without esophagitis: Secondary | ICD-10-CM | POA: Diagnosis not present

## 2018-09-02 DIAGNOSIS — R1319 Other dysphagia: Secondary | ICD-10-CM

## 2018-09-03 ENCOUNTER — Telehealth (HOSPITAL_COMMUNITY): Payer: Self-pay

## 2018-09-03 NOTE — Telephone Encounter (Signed)
I called the office but they were closed. I paged him but no response. I sent him a note with my cell number and requested that if he wanted to talk to me that he either call my cell number or leave his cell # and not his pager number.

## 2018-09-03 NOTE — Telephone Encounter (Signed)
Dr. Buccini's office called stating he needs to talk to you about patient and her upcoming procedure. His pager number [phone].

## 2018-09-14 DIAGNOSIS — R0602 Shortness of breath: Secondary | ICD-10-CM | POA: Diagnosis not present

## 2018-09-14 DIAGNOSIS — I5022 Chronic systolic (congestive) heart failure: Secondary | ICD-10-CM | POA: Diagnosis not present

## 2018-09-23 DIAGNOSIS — N184 Chronic kidney disease, stage 4 (severe): Secondary | ICD-10-CM | POA: Diagnosis not present

## 2018-09-23 DIAGNOSIS — I5040 Unspecified combined systolic (congestive) and diastolic (congestive) heart failure: Secondary | ICD-10-CM | POA: Diagnosis not present

## 2018-09-23 DIAGNOSIS — K210 Gastro-esophageal reflux disease with esophagitis: Secondary | ICD-10-CM | POA: Diagnosis not present

## 2018-09-23 DIAGNOSIS — R1310 Dysphagia, unspecified: Secondary | ICD-10-CM | POA: Diagnosis not present

## 2018-09-23 DIAGNOSIS — K222 Esophageal obstruction: Secondary | ICD-10-CM | POA: Diagnosis not present

## 2018-09-24 ENCOUNTER — Encounter (HOSPITAL_COMMUNITY): Payer: Self-pay | Admitting: *Deleted

## 2018-09-25 ENCOUNTER — Ambulatory Visit (HOSPITAL_COMMUNITY): Payer: Medicare HMO | Admitting: Anesthesiology

## 2018-09-25 ENCOUNTER — Encounter (HOSPITAL_COMMUNITY): Payer: Self-pay | Admitting: Emergency Medicine

## 2018-09-25 ENCOUNTER — Ambulatory Visit (HOSPITAL_COMMUNITY)
Admission: RE | Admit: 2018-09-25 | Discharge: 2018-09-25 | Disposition: A | Discharge status: Home / Self Care | Payer: Medicare HMO | Source: Ambulatory Visit | Attending: Gastroenterology | Admitting: Gastroenterology

## 2018-09-25 ENCOUNTER — Other Ambulatory Visit: Payer: Self-pay

## 2018-09-25 ENCOUNTER — Encounter (HOSPITAL_COMMUNITY)
Admission: RE | Disposition: A | Discharge status: Home / Self Care | Payer: Self-pay | Source: Ambulatory Visit | Attending: Gastroenterology

## 2018-09-25 DIAGNOSIS — I130 Hypertensive heart and chronic kidney disease with heart failure and stage 1 through stage 4 chronic kidney disease, or unspecified chronic kidney disease: Secondary | ICD-10-CM | POA: Insufficient documentation

## 2018-09-25 DIAGNOSIS — N189 Chronic kidney disease, unspecified: Secondary | ICD-10-CM | POA: Diagnosis not present

## 2018-09-25 DIAGNOSIS — Z886 Allergy status to analgesic agent status: Secondary | ICD-10-CM | POA: Diagnosis not present

## 2018-09-25 DIAGNOSIS — E1122 Type 2 diabetes mellitus with diabetic chronic kidney disease: Secondary | ICD-10-CM | POA: Diagnosis not present

## 2018-09-25 DIAGNOSIS — Z7982 Long term (current) use of aspirin: Secondary | ICD-10-CM | POA: Insufficient documentation

## 2018-09-25 DIAGNOSIS — R1314 Dysphagia, pharyngoesophageal phase: Secondary | ICD-10-CM | POA: Diagnosis not present

## 2018-09-25 DIAGNOSIS — K222 Esophageal obstruction: Secondary | ICD-10-CM | POA: Insufficient documentation

## 2018-09-25 DIAGNOSIS — K209 Esophagitis, unspecified: Secondary | ICD-10-CM | POA: Insufficient documentation

## 2018-09-25 DIAGNOSIS — Z79899 Other long term (current) drug therapy: Secondary | ICD-10-CM | POA: Insufficient documentation

## 2018-09-25 DIAGNOSIS — I4891 Unspecified atrial fibrillation: Secondary | ICD-10-CM | POA: Diagnosis not present

## 2018-09-25 DIAGNOSIS — Z87891 Personal history of nicotine dependence: Secondary | ICD-10-CM | POA: Diagnosis not present

## 2018-09-25 DIAGNOSIS — R933 Abnormal findings on diagnostic imaging of other parts of digestive tract: Secondary | ICD-10-CM | POA: Diagnosis present

## 2018-09-25 DIAGNOSIS — Z887 Allergy status to serum and vaccine status: Secondary | ICD-10-CM | POA: Insufficient documentation

## 2018-09-25 DIAGNOSIS — Z888 Allergy status to other drugs, medicaments and biological substances status: Secondary | ICD-10-CM | POA: Diagnosis not present

## 2018-09-25 DIAGNOSIS — Z881 Allergy status to other antibiotic agents status: Secondary | ICD-10-CM | POA: Diagnosis not present

## 2018-09-25 DIAGNOSIS — Z885 Allergy status to narcotic agent status: Secondary | ICD-10-CM | POA: Insufficient documentation

## 2018-09-25 DIAGNOSIS — E785 Hyperlipidemia, unspecified: Secondary | ICD-10-CM | POA: Diagnosis not present

## 2018-09-25 DIAGNOSIS — Z880 Allergy status to penicillin: Secondary | ICD-10-CM | POA: Diagnosis not present

## 2018-09-25 DIAGNOSIS — I5042 Chronic combined systolic (congestive) and diastolic (congestive) heart failure: Secondary | ICD-10-CM | POA: Insufficient documentation

## 2018-09-25 HISTORY — PX: ESOPHAGOGASTRODUODENOSCOPY (EGD) WITH PROPOFOL: SHX5813

## 2018-09-25 HISTORY — PX: SAVORY DILATION: SHX5439

## 2018-09-25 LAB — GLUCOSE, CAPILLARY: Glucose-Capillary: 116 mg/dL — ABNORMAL HIGH (ref 70–99)

## 2018-09-25 SURGERY — ESOPHAGOGASTRODUODENOSCOPY (EGD) WITH PROPOFOL
Anesthesia: Monitor Anesthesia Care

## 2018-09-25 MED ORDER — SODIUM CHLORIDE 0.9 % IV SOLN: INTRAVENOUS | Status: DC

## 2018-09-25 MED ORDER — PROPOFOL 500 MG/50ML IV EMUL
INTRAVENOUS | Status: DC | PRN
  Administered 2018-09-25: 100 ug/kg/min via INTRAVENOUS

## 2018-09-25 MED ORDER — PROPOFOL 10 MG/ML IV BOLUS
INTRAVENOUS | Status: AC
  Filled 2018-09-25: qty 40

## 2018-09-25 MED ORDER — LACTATED RINGERS IV SOLN
INTRAVENOUS | Status: DC
  Administered 2018-09-25: 13:00:00 via INTRAVENOUS

## 2018-09-25 MED ORDER — PROPOFOL 10 MG/ML IV BOLUS
INTRAVENOUS | Status: DC | PRN
  Administered 2018-09-25 (×2): 20 mg via INTRAVENOUS

## 2018-09-25 SURGICAL SUPPLY — 14 items

## 2018-09-25 NOTE — Anesthesia Preprocedure Evaluation (Addendum)
Anesthesia Evaluation  Patient identified by MRN, date of birth, ID band Patient awake    Reviewed: Allergy & Precautions, NPO status , Patient's Chart, lab work & pertinent test results, reviewed documented beta blocker date and time   History of Anesthesia Complications Negative for: history of anesthetic complications  Airway Mallampati: II  TM Distance: >3 FB Neck ROM: Full    Dental  (+) Upper Dentures, Lower Dentures, Dental Advisory Given   Pulmonary COPD (was O2 dependent in april), former smoker (quit '18),    breath sounds clear to auscultation       Cardiovascular hypertension, Pt. on home beta blockers and Pt. on medications + CAD ('15 cath: heavily calcified coronaries with non-obstructive CAD) and +CHF (chronic)  + dysrhythmias (s/p PVC ablation)  Rhythm:Regular Rate:Normal + Systolic murmurs 2/62 R heart cath: normal filling pressures and output '16 ECHO:  EF 60-65%, no RWMA, Gr 1 DD, mod AI, mod MR 4/19 ECHO: 35% to 40%. Diffuse hypokinesis. Mod MR, mild TR   Neuro/Psych negative neurological ROS     GI/Hepatic Neg liver ROS, GERD (esophageal stricture)  Medicated and Poorly Controlled,  Endo/Other  diabetes (glu 116)  Renal/GU Renal InsufficiencyRenal disease     Musculoskeletal   Abdominal   Peds  Hematology negative hematology ROS (+)   Anesthesia Other Findings   Reproductive/Obstetrics                           Anesthesia Physical Anesthesia Plan  ASA: III  Anesthesia Plan: MAC   Post-op Pain Management:    Induction:   PONV Risk Score and Plan: 2 and Treatment may vary due to age or medical condition  Airway Management Planned: Nasal Cannula and Natural Airway  Additional Equipment:   Intra-op Plan:   Post-operative Plan:   Informed Consent: I have reviewed the patients History and Physical, chart, labs and discussed the procedure including the risks,  benefits and alternatives for the proposed anesthesia with the patient or authorized representative who has indicated his/her understanding and acceptance.   Dental advisory given  Plan Discussed with: CRNA and Surgeon  Anesthesia Plan Comments: (Plan routine monitors, MAC)        Anesthesia Quick Evaluation

## 2018-09-25 NOTE — Anesthesia Postprocedure Evaluation (Signed)
Anesthesia Post Note  Patient: Krista Mason  Procedure(s) Performed: ESOPHAGOGASTRODUODENOSCOPY (EGD) WITH PROPOFOL (N/A ) SAVORY DILATION (N/A )     Patient location during evaluation: Endoscopy Anesthesia Type: MAC Level of consciousness: awake and alert, oriented and patient cooperative Pain management: pain level controlled Vital Signs Assessment: post-procedure vital signs reviewed and stable Respiratory status: nonlabored ventilation, spontaneous breathing and respiratory function stable Cardiovascular status: blood pressure returned to baseline and stable Postop Assessment: no apparent nausea or vomiting Anesthetic complications: no    Last Vitals:  Vitals:   09/25/18 1433 09/25/18 1435  BP: (!) 123/44   Pulse: 61   Resp: 16   Temp: 36.5 C   SpO2: 100% 100%    Last Pain:  Vitals:   09/25/18 1433  TempSrc: Oral  PainSc: 0-No pain                 Davidjames Blansett,E. Makiya Jeune

## 2018-09-25 NOTE — H&P (Signed)
Krista Mason is an 82 y.o. female.   Chief Complaint: Dysphagia HPI: The patient has a longstanding history of esophageal dysphagia requiring endoscopic dilatation on several successive procedures approximately 2 years ago with good control of dysphagia symptoms.  The patient, however, became somewhat lax in her use of PPI therapy and in recent months has developed severe dysphagia symptoms, with radiographic confirmation of a 6 cm stricture in the esophagus.  She has significant issues of heart failure (ejection fraction approximately 30%) and atrial fibrillation (on aspirin) as well as stage IV kidney disease.  Past Medical History:  Diagnosis Date  . Atrial fibrillation (Krista Mason)   . Chronic combined systolic and diastolic CHF (congestive heart failure) (Krista Mason)    a. LV dysfcuntion felt to be related to PVCs  . Chronic kidney disease   . Diabetes mellitus without complication (Krista Mason)   . Hx of echocardiogram    Echo (1/16):  EF 60-65%, no RWMA, Gr 1 DD, mod AI, mod MR, mild LAE  . Hyperkalemia   . Hyperlipidemia   . Hypertension   . Orthostatic hypotension   . PVC (premature ventricular contraction)    a. s/p PVC ablation in 11/2016  . Rheumatic fever     Past Surgical History:  Procedure Laterality Date  . KNEE SURGERY Left 2009  . OVARIAN CYST REMOVAL    . PVC ABLATION N/A 11/30/2016   Procedure: PVC Ablation;  Surgeon: Will [name], MD;  Location: [facility];  Service: Cardiovascular;  Laterality: N/A;  . RIGHT HEART CATH N/A 01/31/2018   Procedure: RIGHT HEART CATH;  Surgeon: Jolaine Artist, MD;  Location: [facility];  Service: Cardiovascular;  Laterality: N/A;  . RIGHT/LEFT HEART CATH AND CORONARY ANGIOGRAPHY N/A 01/25/2017   Procedure: Right/Left Heart Cath and Coronary Angiography;  Surgeon: Jolaine Artist, MD;  Location: [facility];  Service: Cardiovascular;  Laterality: N/A;    Family History  Problem Relation Age of Onset  . Heart attack  Father   . Heart disease Father   . Cancer Father   . Hypertension Father   . Hypertension Brother   . Stroke Brother   . Hypertension Brother   . Diabetes Son   . Hyperlipidemia Son   . Hypertension Son    Social History:  reports that she quit smoking about 22 months ago. She has a 45.00 pack-year smoking history. She has never used smokeless tobacco. She reports that she does not drink alcohol or use drugs.  Allergies:  Allergies  Allergen Reactions  . Penicillins Itching and Rash    Amoxicillin ok- IM pen is what gives reaction  Has patient had a PCN reaction causing immediate rash, facial/tongue/throat swelling, SOB or lightheadedness with hypotension: Yes Has patient had a PCN reaction causing severe rash involving mucus membranes or skin necrosis: No Has patient had a PCN reaction that required hospitalization: No Has patient had a PCN reaction occurring within the last 10 years: No If all of the above answers are "NO", then may proceed with Cephalosporin use.  [name] [Cefaclor] Itching  . Crestor [Rosuvastatin Calcium] Nausea Only  . Epinephrine Other (See Comments)    "feels like heart is beating out of chest"  . Influenza Vaccines     Swelling and redness at injection site, fever  . Nsaids     Pt states she is not taking because of kidney function  . Percocet [Oxycodone-Acetaminophen] Nausea And Vomiting  . Prednisone  Dizziness, spiked blood sugar    Medications Prior to Admission  Medication Sig Dispense Refill  . acetaminophen (TYLENOL) 500 MG tablet Take 500 mg by mouth daily as needed for moderate pain.    [name] aspirin 81 MG tablet Take 1 tablet (81 mg total) by mouth daily. 30 tablet 11  . atorvastatin (LIPITOR) 20 MG tablet Take 1 tablet (20 mg total) by mouth daily. (Patient taking differently: Take 20 mg by mouth at bedtime. ) 30 tablet 9  . carvedilol (COREG) 6.25 MG tablet Take 1.5 tablets (9.375 mg total) by mouth 2 (two) times daily. 270 tablet 3  .  Cholecalciferol (VITAMIN D) 50 MCG (2000 UT) tablet Take 2,000 Units by mouth daily.    . furosemide (LASIX) 40 MG tablet Take 1 tablet (40 mg total) by mouth daily. 30 tablet 6  . hydrALAZINE (APRESOLINE) 25 MG tablet TAKE 1 TABLET BY MOUTH TWICE A DAY 180 tablet 3  . losartan (COZAAR) 50 MG tablet TAKE 1 TABLET BY MOUTH EVERY DAY 90 tablet 1  . omeprazole (PRILOSEC) 40 MG capsule Take 40 mg by mouth 2 (two) times daily.     Vladimir Faster Glycol-Propyl Glycol (SYSTANE OP) Place 1 drop into both eyes 2 (two) times daily as needed (dryness).    . vitamin C (ASCORBIC ACID) 500 MG tablet Take 500 mg by mouth daily as needed (bruising).      Results for orders placed or performed during the hospital encounter of 09/25/18 (from the past 48 hour(s))  Glucose, capillary     Status: Abnormal   Collection Time: 09/25/18 12:36 PM  Result Value Ref Range   Glucose-Capillary 116 (H) 70 - 99 mg/dL   No results found.  ROS  Blood pressure (!) 167/50, pulse 63, temperature 98.1 F (36.7 C), temperature source Oral, resp. rate 18, height 5\' 5"  (1.651 m), weight 68.9 kg, SpO2 99 %. Physical Exam  Appears generally healthy.  No evident distress.  Anicteric and no pallor.  Chest clear, heart has irregular rhythm with fairly well controlled rate, abdomen nontender.  No pallor, icterus, or evident focal neurologic deficits Assessment/Plan Proceed endoscopic dilatation.  I have discussed the patient with both the patient and her son.  The realize that she is at an increased anesthesia risk as well as risk for perforation because of the length and severity of her stricture, and that esophageal perforation would likely be fatal if it were to occur.  I plan a conservative dilatation, probably under fluoroscopic guidance.  As before, we may need to do this procedure several times to get her esophagus up to an adequate diameter for her to be able to eat reasonably well.  If there is severe ulceration present at the time  of today's endoscopy, we will need to defer dilatation until a later time, treating with perhaps a powdered proton pump inhibitor in the meantime.  Cleotis Nipper, MD 09/25/2018, 1:19 PM

## 2018-09-25 NOTE — Op Note (Signed)
[facility] Patient Name: Krista Mason Procedure Date: 09/25/2018 MRN: [id number] Attending MD: Ronald Lobo , MD Date of Birth: [birth year] CSN: [id number] Age: 82 Admit Type: Inpatient Procedure:                Upper GI endoscopy Indications:              Esophageal dysphagia, Abnormal cine-esophagram                            showing 6cm length stricture and hang-up of the                            barium tablet. Providers:                Ronald Lobo, MD, Baird Cancer, RN, Alan Mulder, Technician, [place] Alday CRNA, CRNA Referring MD:              Medicines:                Monitored Anesthesia Care Complications:            No immediate complications. Estimated Blood Loss:     Estimated blood loss was minimal. Procedure:                Pre-Anesthesia Assessment:                           - Prior to the procedure, a History and Physical                            was performed, and patient medications and                            allergies were reviewed. The patient's tolerance of                            previous anesthesia was also reviewed. The risks                            and benefits of the procedure and the sedation                            options and risks were discussed with the patient.                            All questions were answered, and informed consent                            was obtained. Prior Anticoagulants: The patient has                            taken aspirin, last dose was 1 day prior to  procedure. ASA Grade Assessment: III - A patient                            with severe systemic disease. After reviewing the                            risks and benefits, the patient was deemed in                            satisfactory condition to undergo the procedure.                           After obtaining informed consent, the endoscope was   passed under direct vision. Throughout the                            procedure, the patient's blood pressure, pulse, and                            oxygen saturations were monitored continuously. The                            GIF-H190 ([id number] Olympus adult endoscope was                            introduced through the mouth, and advanced to the                            distal esophagus, whereupon it met resistance and                            could not be advanced farther due to stricturing.                            We then switched to the GIF-[id number] ([id number])                            Olympus Ultra Slim EGD scope which was introduced                            through the mouth and advanced to the second                            duodenum without significant resistance. The upper                            GI endoscopy was accomplished without difficulty.                            The patient tolerated the procedure well. Scope In: Scope Out: Findings:      The larynx was normal.      Mild to moderate esophagitis was found in the distal esophagus,       characterized by mucosal desquamation and perhaps some  erosions, but no       deep ulcerations.      Ring-like benign-appearing, intrinsic severe stenoses were found in the       distal esophagus, superimposed on some subtle tapered luminal narrowing.       The narrowest stenosis measured approximately 7 mm (inner diameter). The       stenoses were traversed after downsizing scope to the ULTRASLIM       pediatric upper endoscope. A guidewire was placed and the scope was       withdrawn. Dilation was performed with a 7 mm Savary dilator with no       resistance. Reinspection showed minimal mucosal abrasion with no mucosal       disruption. The guidewire was reinserted and the scope was withdrawn.       Dilation was next performed with a 9 mm Savary dilator with mild       resistance. The dilation site was examined following  endoscope       reinsertion and showed mild mucosal disruption in the wall in several       locations just above the area of the ring, and mild improvement in       luminal narrowing. It was elected not to do further dilatation today.       Estimated blood loss was minimal.      The entire examined stomach was normal.      The cardia and gastric fundus were normal on retroflexion.      The examined duodenum was normal. Impression:               - Normal larynx.                           - Mild to moderate esophagitis.                           - Benign-appearing esophageal Ring-like stenosis                            superimposed on tapered luminal narrowing. Dilated                            to 9 mm as described above.                           - Normal stomach.                           - Normal examined duodenum.                           - No specimens collected. Moderate Sedation:      This patient was sedated with monitored anesthesia care, not moderate       sedation. Recommendation:           - Observe patient's clinical course following                            today's procedure with therapeutic intervention.                           -  Continue present medications.                           - Repeat upper endoscopy in 3 weeks for retreatment. Procedure Code(s):        --- Professional ---                           [phone], Esophagogastroduodenoscopy, flexible,                            transoral; with insertion of guide wire followed by                            passage of dilator(s) through esophagus over guide                            wire Diagnosis Code(s):        --- Professional ---                           K20.9, Esophagitis, unspecified                           K22.2, Esophageal obstruction                           R13.14, Dysphagia, pharyngoesophageal phase                           R93.3, Abnormal findings on diagnostic imaging of                             other parts of digestive tract CPT copyright 2018 American [employer] Association. All rights reserved. The codes documented in this report are preliminary and upon coder review may  be revised to meet current compliance requirements. Ronald Lobo, MD 09/25/2018 2:40:00 PM This report has been signed electronically. Number of Addenda: 0

## 2018-09-25 NOTE — Discharge Instructions (Signed)

## 2018-09-25 NOTE — Transfer of Care (Signed)
Immediate Anesthesia Transfer of Care Note  Patient: Krista Mason  Procedure(s) Performed: ESOPHAGOGASTRODUODENOSCOPY (EGD) WITH PROPOFOL (N/A ) SAVORY DILATION (N/A )  Patient Location: PACU  Anesthesia Type:MAC  Level of Consciousness: sedated  Airway & Oxygen Therapy: Patient Spontanous Breathing and Patient connected to nasal cannula oxygen  Post-op Assessment: Report given to RN and Post -op Vital signs reviewed and stable  Post vital signs: Reviewed and stable  Last Vitals:  Vitals Value Taken Time  BP    Temp    Pulse    Resp    SpO2      Last Pain:  Vitals:   09/25/18 1221  TempSrc: Oral         Complications: No apparent anesthesia complications

## 2018-09-26 ENCOUNTER — Encounter (HOSPITAL_COMMUNITY): Payer: Self-pay | Admitting: Gastroenterology

## 2018-09-29 ENCOUNTER — Other Ambulatory Visit: Payer: Self-pay | Admitting: Gastroenterology

## 2018-10-01 ENCOUNTER — Encounter (HOSPITAL_COMMUNITY): Payer: Self-pay | Admitting: Internal Medicine

## 2018-10-01 ENCOUNTER — Ambulatory Visit (HOSPITAL_COMMUNITY)
Admission: RE | Admit: 2018-10-01 | Discharge: 2018-10-01 | Disposition: A | Discharge status: Home / Self Care | Payer: Medicare HMO | Source: Ambulatory Visit | Attending: Internal Medicine | Admitting: Internal Medicine

## 2018-10-01 VITALS — BP 134/74 | HR 65 | Wt 155.0 lb

## 2018-10-01 DIAGNOSIS — E1122 Type 2 diabetes mellitus with diabetic chronic kidney disease: Secondary | ICD-10-CM | POA: Insufficient documentation

## 2018-10-01 DIAGNOSIS — I5042 Chronic combined systolic (congestive) and diastolic (congestive) heart failure: Secondary | ICD-10-CM | POA: Diagnosis not present

## 2018-10-01 DIAGNOSIS — I130 Hypertensive heart and chronic kidney disease with heart failure and stage 1 through stage 4 chronic kidney disease, or unspecified chronic kidney disease: Secondary | ICD-10-CM | POA: Diagnosis not present

## 2018-10-01 DIAGNOSIS — I447 Left bundle-branch block, unspecified: Secondary | ICD-10-CM | POA: Diagnosis not present

## 2018-10-01 DIAGNOSIS — E785 Hyperlipidemia, unspecified: Secondary | ICD-10-CM | POA: Insufficient documentation

## 2018-10-01 DIAGNOSIS — R079 Chest pain, unspecified: Secondary | ICD-10-CM | POA: Diagnosis not present

## 2018-10-01 DIAGNOSIS — D472 Monoclonal gammopathy: Secondary | ICD-10-CM | POA: Insufficient documentation

## 2018-10-01 DIAGNOSIS — Z7982 Long term (current) use of aspirin: Secondary | ICD-10-CM | POA: Diagnosis not present

## 2018-10-01 DIAGNOSIS — Z87891 Personal history of nicotine dependence: Secondary | ICD-10-CM | POA: Diagnosis not present

## 2018-10-01 DIAGNOSIS — I739 Peripheral vascular disease, unspecified: Secondary | ICD-10-CM | POA: Insufficient documentation

## 2018-10-01 DIAGNOSIS — I428 Other cardiomyopathies: Secondary | ICD-10-CM | POA: Insufficient documentation

## 2018-10-01 DIAGNOSIS — N183 Chronic kidney disease, stage 3 unspecified: Secondary | ICD-10-CM

## 2018-10-01 DIAGNOSIS — I5022 Chronic systolic (congestive) heart failure: Secondary | ICD-10-CM | POA: Diagnosis not present

## 2018-10-01 DIAGNOSIS — I493 Ventricular premature depolarization: Secondary | ICD-10-CM | POA: Diagnosis not present

## 2018-10-01 DIAGNOSIS — I2510 Atherosclerotic heart disease of native coronary artery without angina pectoris: Secondary | ICD-10-CM | POA: Insufficient documentation

## 2018-10-01 DIAGNOSIS — K219 Gastro-esophageal reflux disease without esophagitis: Secondary | ICD-10-CM | POA: Diagnosis not present

## 2018-10-01 DIAGNOSIS — Z79899 Other long term (current) drug therapy: Secondary | ICD-10-CM | POA: Insufficient documentation

## 2018-10-01 NOTE — Patient Instructions (Signed)
Your physician has requested that you have an echocardiogram. Echocardiography is a painless test that uses sound waves to create images of your heart. It provides your doctor with information about the size and shape of your heart and how well your heart's chambers and valves are working. This procedure takes approximately one hour. There are no restrictions for this procedure.  Follow up with Dr. Bensimhon in 3 months. 

## 2018-10-01 NOTE — Progress Notes (Signed)
Advanced Heart Failure Clinic Note    Primary Cardiologist: Dr Vaughan Browner Nephrologist: Dr Hollie Salk  HPI: Krista Mason is a 82 y.o. female with a history of HTN, HL, CKD 3, moderate mitral regurgitation, DM, and chronic systolic heart failure .  Admitted 01/28/18 with dyspnea. ECHO had gone down to 35-40% (previously 45%). Had RHC with normal filling pressures and cardiac output. Creatinine peaked at 2.1 so lasix was held for few days after discharge. Myeloma panel - 0.3 M protein noted. PYP scan was negative for TTR. Has seen Dr. Beryle Beams and not felt to have myeloma.   Has had CPX test 02/2018 which showed - submax test with moderate to severe functional limitation due to HF, lung disease and deconditioning. However, markedly elevated Ve/VCO2 slope points to HF as being the predominant limitation and would suggest work-up for advanced therapies if patient is a candidate.   Today she returns for HF follow up. Last visit lasix and hydralazine were increased. Had esophageal dilation on Thursday last week. Overall feels much better than she has in a while due to dilation. She is more SOB in the morning and feels SOB with showering and getting dressed. Okay with walking short distances, like from waiting room. No edema or PND. Sleeps at an incline due to GERD. She was lightheaded after endo for a while, but this has resolved. Appetite decreased. She has had 3 episodes of CP in the last month, one since the dilation. Described as midsternal up into roof of her mouth. Associated diaphoresis, but no other symptoms. Does not occur with activity. Typically occurs at night and lasts 10-15 minutes. Weight up 1 lb on our scale, but steady at 151-152 lbs on her home scale. Taking all medications. Does not check BP at home.   5/19 CPX FVC 1.97 (73%)    FEV1 1.24 (61%)     FEV1/FVC 63 (81%)     MVV 49 (59%)     Resting HR: 58 Peak HR: 110  (78% age predicted max HR) BP rest: 152/66 BP peak:  214/70 Peak VO2: 9.6 (57% predicted peak VO2) VE/VCO2 slope: 50 OUES: 0.87 Peak RER: 0.93 VE/MVV: 70% O2pulse: 6  (75% predicted O2pulse)   01/2018 PYP scan negative for TTR  RHC 01/2018  RA = 3 RV = 29/3 PA = 25/12 (16) PCW = 8 Fick cardiac output/index = 5.3/3.0 PVR = 1.5 WU Ao sat = 97% PA sat = 65%, 67% Assessment: 1. Normal filling pressures and outputs  LHC 01/2017  Heavily calcified but non-obstructive CAD. 50% RCA and 30% LAD.   ECHO 01/2018  EF 35%  Severe concentric LVH with diffuse hypokinesis and paradoxical   septal motion. Thickened aortic and mitral valve leaflets. Mild   aortic regurgitation and moderate mitral regurgitation. Trivial   posteriorly located pericardial effusion.   Evaluation for cardiac amyloidosis should be considered .  ECHO 2018 2018, the LVEF is improved from   35-40% up to 40-45%. There is persistent moderate mitral   regurgitation.  Review of systems complete and found to be negative unless listed in HPI.   SH:  Social History   Socioeconomic History  . Marital status: Widowed    Spouse name: Not on file  . Number of children: Not on file  . Years of education: Not on file  . Highest education level: Not on file  Occupational History  . Not on file  Social Needs  . Financial resource strain: Not on file  . Food insecurity:  Worry: Not on file    Inability: Not on file  . Transportation needs:    Medical: Not on file    Non-medical: Not on file  Tobacco Use  . Smoking status: Former Smoker    Packs/day: 0.75    Years: 60.00    Pack years: 45.00    Last attempt to quit: 11/04/2016    Years since quitting: 1.9  . Smokeless tobacco: Never Used  Substance and Sexual Activity  . Alcohol use: No  . Drug use: No  . Sexual activity: Not on file  Lifestyle  . Physical activity:    Days per week: Not on file    Minutes per session: Not on file  . Stress: Not on file  Relationships  . Social connections:     Talks on phone: Not on file    Gets together: Not on file    Attends religious service: Not on file    Active member of club or organization: Not on file    Attends meetings of clubs or organizations: Not on file    Relationship status: Not on file  . Intimate partner violence:    Fear of current or ex partner: Not on file    Emotionally abused: Not on file    Physically abused: Not on file    Forced sexual activity: Not on file  Other Topics Concern  . Not on file  Social History Narrative  . Not on file    FH:  Family History  Problem Relation Age of Onset  . Heart attack Father   . Heart disease Father   . Cancer Father   . Hypertension Father   . Hypertension Brother   . Stroke Brother   . Hypertension Brother   . Diabetes Son   . Hyperlipidemia Son   . Hypertension Son     Past Medical History:  Diagnosis Date  . Atrial fibrillation ([place])   . Chronic combined systolic and diastolic CHF (congestive heart failure) ([place])    a. LV dysfcuntion felt to be related to PVCs  . Chronic kidney disease   . Diabetes mellitus without complication ([place])   . Hx of echocardiogram    Echo (1/16):  EF 60-65%, no RWMA, Gr 1 DD, mod AI, mod MR, mild LAE  . Hyperkalemia   . Hyperlipidemia   . Hypertension   . Orthostatic hypotension   . PVC (premature ventricular contraction)    a. s/p PVC ablation in 11/2016  . Rheumatic fever     Current Outpatient Medications  Medication Sig Dispense Refill  . aspirin 81 MG tablet Take 1 tablet (81 mg total) by mouth daily. 30 tablet 11  . atorvastatin (LIPITOR) 20 MG tablet Take 1 tablet (20 mg total) by mouth daily. (Patient taking differently: Take 20 mg by mouth at bedtime. ) 30 tablet 9  . carvedilol (COREG) 6.25 MG tablet Take 1.5 tablets (9.375 mg total) by mouth 2 (two) times daily. 270 tablet 3  . Cholecalciferol (VITAMIN D) 50 MCG (2000 UT) tablet Take 2,000 Units by mouth daily.    . furosemide (LASIX) 40 MG tablet Take 1 tablet  (40 mg total) by mouth daily. 30 tablet 6  . hydrALAZINE (APRESOLINE) 25 MG tablet TAKE 1 TABLET BY MOUTH TWICE A DAY 180 tablet 3  . losartan (COZAAR) 50 MG tablet TAKE 1 TABLET BY MOUTH EVERY DAY 90 tablet 1  . omeprazole (PRILOSEC) 40 MG capsule Take 40 mg by mouth 2 (two) times  daily.     . Polyethyl Glycol-Propyl Glycol (SYSTANE OP) Place 1 drop into both eyes 2 (two) times daily as needed (dryness).    [name] acetaminophen (TYLENOL) 500 MG tablet Take 500 mg by mouth daily as needed for moderate pain.    . vitamin C (ASCORBIC ACID) 500 MG tablet Take 500 mg by mouth daily as needed (bruising).     No current facility-administered medications for this encounter.     Vitals:   10/01/18 1020  BP: 134/74  Pulse: 65  SpO2: 95%  Weight: 70.3 kg (155 lb)   Filed Weights   10/01/18 1020  Weight: 70.3 kg (155 lb)   Wt Readings from Last 3 Encounters:  10/01/18 70.3 kg (155 lb)  09/25/18 68.9 kg (152 lb)  07/30/18 69.9 kg (154 lb 3.2 oz)    PHYSICAL EXAM: General: Elderly. No resp difficulty. HEENT: Normal Neck: Supple. JVP flat. Carotids 2+ bilat; no bruits. No thyromegaly or nodule noted. Cor: PMI nondisplaced. RRR, soft AS murmur Lungs: CTAB, normal effort. Abdomen: Soft, non-tender, non-distended, no HSM. No bruits or masses. +BS  Extremities: No cyanosis, clubbing, or rash. R and LLE no edema.  Neuro: Alert & orientedx3, cranial nerves grossly intact. moves all 4 extremities w/o difficulty. Affect pleasant  ECG:  NSR 62 bpm with 1st degree AV block, LBBB (QRS 148 ms). Personally reviewed.    ASSESSMENT & PLAN:   1.Chronic combined CHF: NICM s/p PVC ablation. Now with LBBB. Echo8/18EF 40% (unchanged from prior). - volume status low and cardiac output normal on RHC in 4/19 - Echo 01/2018 LVEF 35-40%, Mod/Sev MR, LVH. Concern for amyloid.  -PYP scan negative for TTR amyloid. Has MGUS and has seen Dr. Beryle Beams but low suspicion for AL amyloid/myeloma. 24 hour urine  with no Mspike - Continues with NYHA III symptoms. Volume looks stable on exam. - Continue lasix 40 daily - CPX suggests severe multifactorial limitation but HF predominates. Unfortunately, not candidate for advanced therapies due to age and comorbidities.  - Can consider possible CRT. Will discuss with EP.  - Continue coreg 9.375 mg twice a day.  - Continue losartan 50 mg bid.  - Continue hydralazine to 25 mg BID. Unable to remember TID  2. Chest pain - Last cath 01/2017 with heavily calcified but non-obstructive CAD. 50% RCA and 30% LAD. -CT chest stable with old granulomatous disease. - Continue ASA and statin - Continues to have episodes of CP radiating into mouth at night. Does not occur with exertion. I think this is more GERD related.   3. PVCs - s/p ablation. Off Amiodarone therapy. PVC burden 2.1%01/2017. No change.   4. LBBB - EF 35-40% 01/2018. May need CRT-P if symptoms not improving  5. Drop attacks/syncope Resolved.  - Denies any recent occurrences. 30 day monitor unremarkable 10/2017.  6.CKD III - Baseline creatinine 1.7-1.9 - Creatinine 2.0 on 10/15 - Following with Dr Hollie Salk  7. HTN - Mildly elevated today.   8. PAD - ABIs showed moderate disease BLE. Has seen [facility]. Medical therapy for now. No change.    Georgiana Shore, NP 10:26 AM  Patient seen and examined with the above-signed Advanced Practice Provider and/or Housestaff. I personally reviewed laboratory data, imaging studies and relevant notes. I independently examined the patient and formulated the important aspects of the plan. I have edited the note to reflect any of my changes or salient points. I have personally discussed the plan with the patient and/or family.  Overall improved from  cardiac perspective. NYHA II-III. Recent course c/b by need for repeat esophageal dilation. Suspect CP due to dilation and not CAD as she had only mild CAD on recent cath. Have encouraged her to return to her exercise  program with stationary bike riding. Echo raised concern for amyloid but PYP scan completely normal (Personally reviewed) and although she has MGUS not felt to have amyloid. Unable to get cMRI due to CKD. Thus cardiomyopathy may be related to LBBB or PVCs (no suppressed). Will repeat echo. If EF worsening or develops worse symptoms can consider CRT. Can consider cardiac biopsy as needed.  Glori Bickers, MD  11:40 AM

## 2018-10-07 DIAGNOSIS — I739 Peripheral vascular disease, unspecified: Secondary | ICD-10-CM | POA: Diagnosis not present

## 2018-10-07 DIAGNOSIS — N189 Chronic kidney disease, unspecified: Secondary | ICD-10-CM | POA: Diagnosis not present

## 2018-10-07 DIAGNOSIS — R809 Proteinuria, unspecified: Secondary | ICD-10-CM | POA: Diagnosis not present

## 2018-10-07 DIAGNOSIS — N184 Chronic kidney disease, stage 4 (severe): Secondary | ICD-10-CM | POA: Diagnosis not present

## 2018-10-07 DIAGNOSIS — I5022 Chronic systolic (congestive) heart failure: Secondary | ICD-10-CM | POA: Diagnosis not present

## 2018-10-07 DIAGNOSIS — I129 Hypertensive chronic kidney disease with stage 1 through stage 4 chronic kidney disease, or unspecified chronic kidney disease: Secondary | ICD-10-CM | POA: Diagnosis not present

## 2018-10-07 DIAGNOSIS — N2581 Secondary hyperparathyroidism of renal origin: Secondary | ICD-10-CM | POA: Diagnosis not present

## 2018-10-07 DIAGNOSIS — E785 Hyperlipidemia, unspecified: Secondary | ICD-10-CM | POA: Diagnosis not present

## 2018-10-07 DIAGNOSIS — I4891 Unspecified atrial fibrillation: Secondary | ICD-10-CM | POA: Diagnosis not present

## 2018-10-07 DIAGNOSIS — D631 Anemia in chronic kidney disease: Secondary | ICD-10-CM | POA: Diagnosis not present

## 2018-10-14 DIAGNOSIS — I5022 Chronic systolic (congestive) heart failure: Secondary | ICD-10-CM | POA: Diagnosis not present

## 2018-10-14 DIAGNOSIS — R0602 Shortness of breath: Secondary | ICD-10-CM | POA: Diagnosis not present

## 2018-10-21 ENCOUNTER — Other Ambulatory Visit: Payer: Self-pay | Admitting: Oncology

## 2018-10-23 DIAGNOSIS — N184 Chronic kidney disease, stage 4 (severe): Secondary | ICD-10-CM | POA: Diagnosis not present

## 2018-10-28 ENCOUNTER — Other Ambulatory Visit: Payer: Self-pay

## 2018-10-28 ENCOUNTER — Encounter (HOSPITAL_COMMUNITY): Payer: Self-pay | Admitting: *Deleted

## 2018-10-28 ENCOUNTER — Encounter (HOSPITAL_COMMUNITY): Payer: Medicare HMO

## 2018-10-29 ENCOUNTER — Encounter (HOSPITAL_COMMUNITY)
Admission: RE | Disposition: A | Discharge status: Home / Self Care | Payer: Self-pay | Source: Home / Self Care | Attending: Gastroenterology

## 2018-10-29 ENCOUNTER — Ambulatory Visit (HOSPITAL_COMMUNITY)
Admission: RE | Admit: 2018-10-29 | Discharge: 2018-10-29 | Disposition: A | Discharge status: Home / Self Care | Payer: Medicare HMO | Attending: Gastroenterology | Admitting: Gastroenterology

## 2018-10-29 ENCOUNTER — Encounter (HOSPITAL_COMMUNITY): Payer: Self-pay

## 2018-10-29 ENCOUNTER — Ambulatory Visit (HOSPITAL_COMMUNITY): Payer: Medicare HMO | Admitting: Certified Registered"

## 2018-10-29 ENCOUNTER — Other Ambulatory Visit (HOSPITAL_COMMUNITY): Payer: Self-pay | Admitting: *Deleted

## 2018-10-29 ENCOUNTER — Other Ambulatory Visit: Payer: Self-pay

## 2018-10-29 DIAGNOSIS — I2510 Atherosclerotic heart disease of native coronary artery without angina pectoris: Secondary | ICD-10-CM | POA: Diagnosis not present

## 2018-10-29 DIAGNOSIS — N189 Chronic kidney disease, unspecified: Secondary | ICD-10-CM | POA: Diagnosis not present

## 2018-10-29 DIAGNOSIS — K222 Esophageal obstruction: Secondary | ICD-10-CM | POA: Insufficient documentation

## 2018-10-29 DIAGNOSIS — R1314 Dysphagia, pharyngoesophageal phase: Secondary | ICD-10-CM | POA: Diagnosis not present

## 2018-10-29 DIAGNOSIS — Z79899 Other long term (current) drug therapy: Secondary | ICD-10-CM | POA: Diagnosis not present

## 2018-10-29 DIAGNOSIS — I130 Hypertensive heart and chronic kidney disease with heart failure and stage 1 through stage 4 chronic kidney disease, or unspecified chronic kidney disease: Secondary | ICD-10-CM | POA: Diagnosis not present

## 2018-10-29 DIAGNOSIS — J449 Chronic obstructive pulmonary disease, unspecified: Secondary | ICD-10-CM | POA: Insufficient documentation

## 2018-10-29 DIAGNOSIS — I110 Hypertensive heart disease with heart failure: Secondary | ICD-10-CM | POA: Diagnosis not present

## 2018-10-29 DIAGNOSIS — K219 Gastro-esophageal reflux disease without esophagitis: Secondary | ICD-10-CM | POA: Insufficient documentation

## 2018-10-29 DIAGNOSIS — E1122 Type 2 diabetes mellitus with diabetic chronic kidney disease: Secondary | ICD-10-CM | POA: Diagnosis not present

## 2018-10-29 DIAGNOSIS — I5022 Chronic systolic (congestive) heart failure: Secondary | ICD-10-CM | POA: Diagnosis not present

## 2018-10-29 DIAGNOSIS — E785 Hyperlipidemia, unspecified: Secondary | ICD-10-CM | POA: Diagnosis not present

## 2018-10-29 DIAGNOSIS — E1151 Type 2 diabetes mellitus with diabetic peripheral angiopathy without gangrene: Secondary | ICD-10-CM | POA: Insufficient documentation

## 2018-10-29 DIAGNOSIS — K449 Diaphragmatic hernia without obstruction or gangrene: Secondary | ICD-10-CM | POA: Diagnosis not present

## 2018-10-29 DIAGNOSIS — I5042 Chronic combined systolic (congestive) and diastolic (congestive) heart failure: Secondary | ICD-10-CM | POA: Insufficient documentation

## 2018-10-29 DIAGNOSIS — Z7982 Long term (current) use of aspirin: Secondary | ICD-10-CM | POA: Diagnosis not present

## 2018-10-29 DIAGNOSIS — Z87891 Personal history of nicotine dependence: Secondary | ICD-10-CM | POA: Insufficient documentation

## 2018-10-29 HISTORY — PX: ESOPHAGOGASTRODUODENOSCOPY (EGD) WITH PROPOFOL: SHX5813

## 2018-10-29 HISTORY — PX: SAVORY DILATION: SHX5439

## 2018-10-29 SURGERY — ESOPHAGOGASTRODUODENOSCOPY (EGD) WITH PROPOFOL
Anesthesia: Monitor Anesthesia Care

## 2018-10-29 MED ORDER — PROPOFOL 10 MG/ML IV BOLUS
INTRAVENOUS | Status: DC | PRN
  Administered 2018-10-29 (×3): 10 mg via INTRAVENOUS

## 2018-10-29 MED ORDER — SODIUM CHLORIDE 0.9 % IV SOLN
INTRAVENOUS | Status: DC
  Administered 2018-10-29: 13:00:00 via INTRAVENOUS

## 2018-10-29 MED ORDER — PROPOFOL 10 MG/ML IV BOLUS
INTRAVENOUS | Status: AC
  Filled 2018-10-29: qty 40

## 2018-10-29 MED ORDER — PROPOFOL 500 MG/50ML IV EMUL
INTRAVENOUS | Status: DC | PRN
  Administered 2018-10-29: 100 ug/kg/min via INTRAVENOUS

## 2018-10-29 MED ORDER — PROPOFOL 10 MG/ML IV BOLUS
INTRAVENOUS | Status: AC
  Filled 2018-10-29: qty 20

## 2018-10-29 SURGICAL SUPPLY — 15 items

## 2018-10-29 NOTE — Op Note (Signed)
[facility] Patient Name: Krista Mason Procedure Date: 10/29/2018 MRN: [id number] Attending MD: Ronald Lobo , MD Date of Birth: 1936/03/26 CSN: [id number] Age: 83 Admit Type: Outpatient Procedure:                Upper GI endoscopy Indications:              Esophageal dysphagia, For therapy of esophageal                            stricture, last dilated to 11 mm a month ago Providers:                Ronald Lobo, MD, Burtis Junes, RN, Charolette Child,                            Technician, Dellie Catholic Referring MD:              Medicines:                Monitored Anesthesia Care Complications:            No immediate complications. Estimated Blood Loss:     Estimated blood loss was minimal. Procedure:                Pre-Anesthesia Assessment:                           - Prior to the procedure, a History and Physical                            was performed, and patient medications and                            allergies were reviewed. The patient's tolerance of                            previous anesthesia was also reviewed. The risks                            and benefits of the procedure and the sedation                            options and risks were discussed with the patient.                            All questions were answered, and informed consent                            was obtained. Prior Anticoagulants: The patient has                            taken aspirin, last dose was 1 day prior to                            procedure. ASA Grade Assessment: III - A patient  with severe systemic disease. After reviewing the                            risks and benefits, the patient was deemed in                            satisfactory condition to undergo the procedure.                           After obtaining informed consent, the endoscope was                            passed under direct vision. Throughout the                             procedure, the patient's blood pressure, pulse, and                            oxygen saturations were monitored continuously. The                            [id number]-[id number] ([id number]) Olympus Ultra Slim EGD was                            introduced through the mouth, and advanced to the                            second part of duodenum. The upper GI endoscopy was                            accomplished without difficulty. The patient                            tolerated the procedure well. Scope In: Scope Out: Findings:      The larynx was normal.      A small hiatal hernia was present.      No significant esophagitis was present, specifically no esophageal       ulcerations.      A few benign-appearing, intrinsic mild stenoses were found, superimposed       on what appeared at the time of her last procedure to be a tapered       stenosis of the distal esophagus. The stenoses were traversed, using, as       before, the ULTRASLIM pediatric upper endoscope. A guidewire was placed       and the scope was withdrawn. Dilation was performed with a Savary       dilator with mild resistance at 9, 10, and 11 mm. The dilation site was       examined after passage of each dilator, and showed mild but progressive       mucosal disruption with each dilator. Estimated blood loss was minimal.      The entire examined stomach was normal.      The cardia and gastric fundus were normal on retroflexion.      The examined duodenum was normal. Impression:               -  Normal larynx.                           - Small hiatal hernia.                           - Benign-appearing esophageal stenoses. Dilated to                            11 mm by Savary technique.                           - Normal stomach.                           - Normal examined duodenum.                           - No specimens collected. Moderate Sedation:      This patient was sedated with monitored anesthesia care, not moderate        sedation. Recommendation:           - Observe patient's clinical course following                            today's procedure with therapeutic intervention.                            Consider repeat dilatation in 1 month if still                            significantly symptomatic.                           - Continue present medications. Procedure Code(s):        --- Professional ---                           [phone], Esophagogastroduodenoscopy, flexible,                            transoral; with insertion of guide wire followed by                            passage of dilator(s) through esophagus over guide                            wire Diagnosis Code(s):        --- Professional ---                           K22.2, Esophageal obstruction                           R13.14, Dysphagia, pharyngoesophageal phase CPT copyright 2018 [employer]. All rights reserved. The codes documented in this report are preliminary and upon coder review may  be revised to meet current compliance requirements. Ronald Lobo, MD 10/29/2018 2:36:45 PM This report  has been signed electronically. Number of Addenda: 0

## 2018-10-29 NOTE — Anesthesia Preprocedure Evaluation (Addendum)
Anesthesia Evaluation  Patient identified by MRN, date of birth, ID band Patient awake    Reviewed: Allergy & Precautions, NPO status , Patient's Chart, lab work & pertinent test results, reviewed documented beta blocker date and time   History of Anesthesia Complications Negative for: history of anesthetic complications  Airway Mallampati: II  TM Distance: >3 FB Neck ROM: Full    Dental  (+) Upper Dentures, Lower Dentures   Pulmonary COPD (was O2 dependent in april), former smoker,    Pulmonary exam normal        Cardiovascular hypertension, Pt. on home beta blockers and Pt. on medications + CAD ('15 cath: heavily calcified coronaries with non-obstructive CAD), + Peripheral Vascular Disease and +CHF (chronic)  + dysrhythmias (s/p PVC ablation)  Rhythm:Regular Rate:Normal + Systolic murmurs 4/88 R heart cath: normal filling pressures and output '16 ECHO:  EF 60-65%, no RWMA, Gr 1 DD, mod AI, mod MR 4/19 ECHO: 35% to 40%. Diffuse hypokinesis. Mod MR, mild TR   Neuro/Psych negative neurological ROS     GI/Hepatic Neg liver ROS, GERD (esophageal stricture)  Medicated and Poorly Controlled,  Endo/Other  diabetes  Renal/GU Renal InsufficiencyRenal disease     Musculoskeletal   Abdominal   Peds  Hematology negative hematology ROS (+)   Anesthesia Other Findings No interval change since prior procedure.  Reproductive/Obstetrics                           Anesthesia Physical  Anesthesia Plan  ASA: III  Anesthesia Plan: MAC   Post-op Pain Management:    Induction:   PONV Risk Score and Plan: 2 and Treatment may vary due to age or medical condition and Propofol infusion  Airway Management Planned: Nasal Cannula and Simple Face Mask  Additional Equipment: None  Intra-op Plan:   Post-operative Plan:   Informed Consent: I have reviewed the patients History and Physical, chart, labs and  discussed the procedure including the risks, benefits and alternatives for the proposed anesthesia with the patient or authorized representative who has indicated his/her understanding and acceptance.   Dental advisory given  Plan Discussed with:   Anesthesia Plan Comments:       Anesthesia Quick Evaluation

## 2018-10-29 NOTE — Anesthesia Procedure Notes (Signed)
Procedure Name: MAC Date/Time: 10/29/2018 2:07 PM Performed by: Niel Hummer, CRNA Pre-anesthesia Checklist: Patient identified, Emergency Drugs available, Suction available and Patient being monitored Patient Re-evaluated:Patient Re-evaluated prior to induction Oxygen Delivery Method: Nasal cannula

## 2018-10-29 NOTE — Discharge Instructions (Signed)

## 2018-10-29 NOTE — Anesthesia Postprocedure Evaluation (Signed)
Anesthesia Post Note  Patient: Krista Mason  Procedure(s) Performed: ESOPHAGOGASTRODUODENOSCOPY (EGD) WITH PROPOFOL (N/A ) SAVORY DILATION (N/A )     Patient location during evaluation: PACU Anesthesia Type: MAC Level of consciousness: awake and alert Pain management: pain level controlled Vital Signs Assessment: post-procedure vital signs reviewed and stable Respiratory status: spontaneous breathing, nonlabored ventilation and respiratory function stable Cardiovascular status: blood pressure returned to baseline and stable Postop Assessment: no apparent nausea or vomiting Anesthetic complications: no    Last Vitals:  Vitals:   10/29/18 1440 10/29/18 1450  BP: (!) 158/45 (!) 173/53  Pulse: 61 74  Resp: 16 (!) 22  Temp:    SpO2: 100% 100%    Last Pain:  Vitals:   10/29/18 1450  TempSrc:   PainSc: 0-No pain                 Lidia Collum

## 2018-10-29 NOTE — Transfer of Care (Signed)
Immediate Anesthesia Transfer of Care Note  Patient: Krista Mason  Procedure(s) Performed: ESOPHAGOGASTRODUODENOSCOPY (EGD) WITH PROPOFOL (N/A ) SAVORY DILATION (N/A )  Patient Location: PACU  Anesthesia Type:MAC  Level of Consciousness: awake, alert  and oriented  Airway & Oxygen Therapy: Patient Spontanous Breathing and Patient connected to nasal cannula oxygen  Post-op Assessment: Report given to RN and Post -op Vital signs reviewed and stable  Post vital signs: Reviewed and stable  Last Vitals:  Vitals Value Taken Time  BP    Temp    Pulse 69 10/29/2018  2:33 PM  Resp 20 10/29/2018  2:33 PM  SpO2 100 % 10/29/2018  2:33 PM  Vitals shown include unvalidated device data.  Last Pain:  Vitals:   10/29/18 1256  TempSrc: Oral  PainSc: 0-No pain         Complications: No apparent anesthesia complications

## 2018-10-29 NOTE — H&P (Signed)
Krista Mason is an 83 y.o. female.   Chief Complaint: Dysphagia HPI: This patient with history of heart failure also has a history of recurrent esophageal stricturing, dilated approximately a year and a half ago to 11 mm but with recurrence when she went off her PPI therapy.    She underwent repeat dilatation of an extremely tight stenosis to 9 mm a month ago.  That brought about noticeable improvement in her swallowing, although she still has significant residual dysphagia symptoms including occasional hang-up of pills, and the necessity to eat very slowly, eating very small bites of food, etc.    She comes in at this time for repeat dilatation, hopefully to a somewhat larger diameter.  She is using omeprazole twice daily.  Past Medical History:  Diagnosis Date  . Atrial fibrillation ([place])   . Chronic combined systolic and diastolic CHF (congestive heart failure) ([place])    a. LV dysfcuntion felt to be related to PVCs  . Chronic kidney disease   . Diabetes mellitus without complication ([place])   . Hx of echocardiogram    Echo (1/16):  EF 60-65%, no RWMA, Gr 1 DD, mod AI, mod MR, mild LAE  . Hyperkalemia   . Hyperlipidemia   . Hypertension   . Orthostatic hypotension   . PVC (premature ventricular contraction)    a. s/p PVC ablation in 11/2016  . Rheumatic fever     Past Surgical History:  Procedure Laterality Date  . ESOPHAGOGASTRODUODENOSCOPY (EGD) WITH PROPOFOL N/A 09/25/2018   Procedure: ESOPHAGOGASTRODUODENOSCOPY (EGD) WITH PROPOFOL;  Surgeon: Ronald Lobo, MD;  Location: [facility];  Service: Endoscopy;  Laterality: N/A;  . KNEE SURGERY Left 2009  . OVARIAN CYST REMOVAL    . PVC ABLATION N/A 11/30/2016   Procedure: PVC Ablation;  Surgeon: Will [name], MD;  Location: [place] [facility];  Service: Cardiovascular;  Laterality: N/A;  . RIGHT HEART CATH N/A 01/31/2018   Procedure: RIGHT HEART CATH;  Surgeon: Jolaine Artist, MD;  Location: [place] [facility];  Service:  Cardiovascular;  Laterality: N/A;  . RIGHT/LEFT HEART CATH AND CORONARY ANGIOGRAPHY N/A 01/25/2017   Procedure: Right/Left Heart Cath and Coronary Angiography;  Surgeon: Jolaine Artist, MD;  Location: [place] [facility];  Service: Cardiovascular;  Laterality: N/A;  . SAVORY DILATION N/A 09/25/2018   Procedure: SAVORY DILATION;  Surgeon: Ronald Lobo, MD;  Location: [facility];  Service: Endoscopy;  Laterality: N/A;    Family History  Problem Relation Age of Onset  . Heart attack Father   . Heart disease Father   . Cancer Father   . Hypertension Father   . Hypertension Brother   . Stroke Brother   . Hypertension Brother   . Diabetes Son   . Hyperlipidemia Son   . Hypertension Son    Social History:  reports that she quit smoking about 1 years ago. She has a 45.00 pack-year smoking history. She has never used smokeless tobacco. She reports that she does not drink alcohol or use drugs.  Allergies:  Allergies  Allergen Reactions  . Penicillins Itching and Rash    Amoxicillin ok- IM pen is what gives reaction  Has patient had a PCN reaction causing immediate rash, facial/tongue/throat swelling, SOB or lightheadedness with hypotension: Yes Has patient had a PCN reaction causing severe rash involving mucus membranes or skin necrosis: No Has patient had a PCN reaction that required hospitalization: No Has patient had a PCN reaction occurring within the last 10 years: No  If all of the above answers are "NO", then may proceed with Cephalosporin use.  [name] [Cefaclor] Itching  . Crestor [Rosuvastatin Calcium] Nausea Only  . Epinephrine Other (See Comments)    "feels like heart is beating out of chest"  . Influenza Vaccines     Swelling and redness at injection site, fever  . Nsaids     Pt states she is not taking because of kidney function  . Percocet [Oxycodone-Acetaminophen] Nausea And Vomiting  . Prednisone     Dizziness, spiked blood sugar    Medications Prior to  Admission  Medication Sig Dispense Refill  . aspirin 81 MG tablet Take 1 tablet (81 mg total) by mouth daily. 30 tablet 11  . carvedilol (COREG) 6.25 MG tablet Take 1.5 tablets (9.375 mg total) by mouth 2 (two) times daily. 270 tablet 3  . Cholecalciferol (VITAMIN D) 50 MCG (2000 UT) tablet Take 2,000 Units by mouth daily.    . furosemide (LASIX) 40 MG tablet Take 1 tablet (40 mg total) by mouth daily. 30 tablet 6  . hydrALAZINE (APRESOLINE) 25 MG tablet TAKE 1 TABLET BY MOUTH TWICE A DAY 180 tablet 3  . losartan (COZAAR) 50 MG tablet TAKE 1 TABLET BY MOUTH EVERY DAY 90 tablet 1  . omeprazole (PRILOSEC) 40 MG capsule Take 40 mg by mouth 2 (two) times daily.     . vitamin C (ASCORBIC ACID) 500 MG tablet Take 500 mg by mouth daily as needed (bruising).    [name] acetaminophen (TYLENOL) 500 MG tablet Take 500 mg by mouth daily as needed for moderate pain.    [name] atorvastatin (LIPITOR) 20 MG tablet Take 1 tablet (20 mg total) by mouth daily. (Patient taking differently: Take 20 mg by mouth at bedtime. ) 30 tablet 9  . Polyethyl Glycol-Propyl Glycol (SYSTANE OP) Place 1 drop into both eyes 2 (two) times daily as needed (dryness).      No results found for this or any previous visit (from the past 48 hour(s)). No results found.  ROS  Blood pressure (!) 183/74, pulse 61, temperature 98.3 F (36.8 C), temperature source Oral, resp. rate 13, height 5\' 5"  (1.651 m), weight 69.1 kg, SpO2 99 %. Physical Exam pleasant, healthy in appearance, no distress, no pallor or icterus, cognitively intact, no evident focal neurologic deficits.  Chest clear, heart without murmur or arrhythmia, abdomen soft and nontender  Assessment/Plan Elderly patient with history of heart failure is sufficiently medically stable to undergo repeat dilatation with the hope of further improving her residual dysphagia symptoms.  Our plan is to use Savary technique, as before.  Cleotis Nipper, MD 10/29/2018, 1:44 PM

## 2018-10-30 ENCOUNTER — Ambulatory Visit (HOSPITAL_COMMUNITY)
Admission: RE | Admit: 2018-10-30 | Discharge: 2018-10-30 | Disposition: A | Discharge status: Home / Self Care | Payer: Medicare HMO | Source: Ambulatory Visit | Attending: Nephrology | Admitting: Nephrology

## 2018-10-30 ENCOUNTER — Encounter (HOSPITAL_COMMUNITY): Payer: Self-pay | Admitting: Gastroenterology

## 2018-10-30 DIAGNOSIS — D631 Anemia in chronic kidney disease: Secondary | ICD-10-CM | POA: Insufficient documentation

## 2018-10-30 DIAGNOSIS — N183 Chronic kidney disease, stage 3 (moderate): Secondary | ICD-10-CM | POA: Diagnosis present

## 2018-10-30 MED ORDER — SODIUM CHLORIDE 0.9 % IV SOLN
510.0000 mg | INTRAVENOUS | Status: DC
  Administered 2018-10-30: 510 mg via INTRAVENOUS
  Filled 2018-10-30: qty 17

## 2018-10-30 NOTE — Addendum Note (Signed)
Addendum  created 10/30/18 [id number] by Lollie Sails, CRNA   Charge Capture section accepted

## 2018-10-30 NOTE — Discharge Instructions (Signed)

## 2018-11-04 ENCOUNTER — Encounter (HOSPITAL_COMMUNITY): Payer: Medicare HMO

## 2018-11-06 ENCOUNTER — Ambulatory Visit (HOSPITAL_COMMUNITY)
Admission: RE | Admit: 2018-11-06 | Discharge: 2018-11-06 | Disposition: A | Discharge status: Home / Self Care | Payer: Medicare HMO | Source: Ambulatory Visit | Attending: Nephrology | Admitting: Nephrology

## 2018-11-06 DIAGNOSIS — D631 Anemia in chronic kidney disease: Secondary | ICD-10-CM | POA: Insufficient documentation

## 2018-11-06 DIAGNOSIS — N189 Chronic kidney disease, unspecified: Secondary | ICD-10-CM | POA: Insufficient documentation

## 2018-11-06 MED ORDER — SODIUM CHLORIDE 0.9 % IV SOLN
510.0000 mg | INTRAVENOUS | Status: DC
  Administered 2018-11-06: 510 mg via INTRAVENOUS
  Filled 2018-11-06: qty 510

## 2018-11-14 DIAGNOSIS — R0602 Shortness of breath: Secondary | ICD-10-CM | POA: Diagnosis not present

## 2018-11-14 DIAGNOSIS — I5022 Chronic systolic (congestive) heart failure: Secondary | ICD-10-CM | POA: Diagnosis not present

## 2018-11-27 DIAGNOSIS — N189 Chronic kidney disease, unspecified: Secondary | ICD-10-CM | POA: Diagnosis not present

## 2018-11-27 DIAGNOSIS — N184 Chronic kidney disease, stage 4 (severe): Secondary | ICD-10-CM | POA: Diagnosis not present

## 2018-12-15 DIAGNOSIS — R0602 Shortness of breath: Secondary | ICD-10-CM | POA: Diagnosis not present

## 2018-12-15 DIAGNOSIS — I5022 Chronic systolic (congestive) heart failure: Secondary | ICD-10-CM | POA: Diagnosis not present

## 2019-01-01 ENCOUNTER — Ambulatory Visit (HOSPITAL_BASED_OUTPATIENT_CLINIC_OR_DEPARTMENT_OTHER)
Admission: RE | Admit: 2019-01-01 | Discharge: 2019-01-01 | Disposition: A | Discharge status: Home / Self Care | Payer: Medicare HMO | Source: Ambulatory Visit | Attending: Internal Medicine | Admitting: Internal Medicine

## 2019-01-01 ENCOUNTER — Other Ambulatory Visit: Payer: Self-pay

## 2019-01-01 ENCOUNTER — Other Ambulatory Visit (HOSPITAL_COMMUNITY): Payer: Medicare HMO

## 2019-01-01 ENCOUNTER — Ambulatory Visit (HOSPITAL_COMMUNITY)
Admission: RE | Admit: 2019-01-01 | Discharge: 2019-01-01 | Disposition: A | Discharge status: Home / Self Care | Payer: Medicare HMO | Source: Ambulatory Visit | Attending: Family Medicine | Admitting: Family Medicine

## 2019-01-01 VITALS — BP 138/68 | HR 72 | Wt 153.0 lb

## 2019-01-01 DIAGNOSIS — I5022 Chronic systolic (congestive) heart failure: Secondary | ICD-10-CM

## 2019-01-01 DIAGNOSIS — Z79899 Other long term (current) drug therapy: Secondary | ICD-10-CM | POA: Diagnosis not present

## 2019-01-01 DIAGNOSIS — Z8249 Family history of ischemic heart disease and other diseases of the circulatory system: Secondary | ICD-10-CM | POA: Insufficient documentation

## 2019-01-01 DIAGNOSIS — I4891 Unspecified atrial fibrillation: Secondary | ICD-10-CM | POA: Insufficient documentation

## 2019-01-01 DIAGNOSIS — D472 Monoclonal gammopathy: Secondary | ICD-10-CM | POA: Diagnosis not present

## 2019-01-01 DIAGNOSIS — I080 Rheumatic disorders of both mitral and aortic valves: Secondary | ICD-10-CM | POA: Diagnosis not present

## 2019-01-01 DIAGNOSIS — Z87891 Personal history of nicotine dependence: Secondary | ICD-10-CM | POA: Diagnosis not present

## 2019-01-01 DIAGNOSIS — I2510 Atherosclerotic heart disease of native coronary artery without angina pectoris: Secondary | ICD-10-CM | POA: Diagnosis not present

## 2019-01-01 DIAGNOSIS — I447 Left bundle-branch block, unspecified: Secondary | ICD-10-CM

## 2019-01-01 DIAGNOSIS — E785 Hyperlipidemia, unspecified: Secondary | ICD-10-CM | POA: Diagnosis not present

## 2019-01-01 DIAGNOSIS — Z7982 Long term (current) use of aspirin: Secondary | ICD-10-CM | POA: Diagnosis not present

## 2019-01-01 DIAGNOSIS — I130 Hypertensive heart and chronic kidney disease with heart failure and stage 1 through stage 4 chronic kidney disease, or unspecified chronic kidney disease: Secondary | ICD-10-CM | POA: Insufficient documentation

## 2019-01-01 DIAGNOSIS — E1151 Type 2 diabetes mellitus with diabetic peripheral angiopathy without gangrene: Secondary | ICD-10-CM | POA: Insufficient documentation

## 2019-01-01 DIAGNOSIS — I428 Other cardiomyopathies: Secondary | ICD-10-CM | POA: Diagnosis not present

## 2019-01-01 DIAGNOSIS — I5042 Chronic combined systolic (congestive) and diastolic (congestive) heart failure: Secondary | ICD-10-CM | POA: Diagnosis not present

## 2019-01-01 DIAGNOSIS — N183 Chronic kidney disease, stage 3 unspecified: Secondary | ICD-10-CM

## 2019-01-01 DIAGNOSIS — E1122 Type 2 diabetes mellitus with diabetic chronic kidney disease: Secondary | ICD-10-CM | POA: Insufficient documentation

## 2019-01-01 NOTE — Progress Notes (Signed)
  Echocardiogram 2D Echocardiogram has been performed.  Randa Lynn Aryianna Earwood 01/01/2019, 12:06 PM

## 2019-01-01 NOTE — Patient Instructions (Signed)
You have been referred to Dr. Curt Bears in Electrophysiology.  They will call you to schedule your appointment.  Your physician recommends that you schedule a follow-up appointment in: 6 months. We will call you to schedule this appointment. IF you do not get a call, please call us to schedule.

## 2019-01-01 NOTE — Progress Notes (Signed)
Advanced Heart Failure Clinic Note    Primary Cardiologist: Dr Vaughan Browner Nephrologist: Dr Hollie Salk  HPI: Krista Mason is a 83 y.o. female with a history of HTN, HL, CKD 3, moderate mitral regurgitation, DM, and chronic systolic heart failure .  Admitted 01/28/18 with dyspnea. ECHO had gone down to 35-40% (previously 45%). Had RHC with normal filling pressures and cardiac output. Creatinine peaked at 2.1 so lasix was held for few days after discharge. Myeloma panel - 0.3 M protein noted. PYP scan was negative for TTR. Has seen Dr. Beryle Beams and not felt to have myeloma.   Has had CPX test 02/2018 which showed - submax test with moderate to severe functional limitation due to HF, lung disease and deconditioning. However, markedly elevated Ve/VCO2 slope points to HF as being the predominant limitation and would suggest work-up for advanced therapies if patient is a candidate.   She presents today for regular follow up. Doing well. Doing ADLs without any problem. Walking a lot more. Can walk about 10 mins before she gets SOB. No edema, orthopnea or PND. No problems with meds.   Echo today shows 35-40% moderate MR. RV ok Personally reviewed   5/19 CPX FVC 1.97 (73%)    FEV1 1.24 (61%)     FEV1/FVC 63 (81%)     MVV 49 (59%)     Resting HR: 58 Peak HR: 110  (78% age predicted max HR) BP rest: 152/66 BP peak: 214/70 Peak VO2: 9.6 (57% predicted peak VO2) VE/VCO2 slope: 50 OUES: 0.87 Peak RER: 0.93 VE/MVV: 70% O2pulse: 6  (75% predicted O2pulse)   01/2018 PYP scan negative for TTR  RHC 01/2018  RA = 3 RV = 29/3 PA = 25/12 (16) PCW = 8 Fick cardiac output/index = 5.3/3.0 PVR = 1.5 WU Ao sat = 97% PA sat = 65%, 67% Assessment: 1. Normal filling pressures and outputs  LHC 01/2017  Heavily calcified but non-obstructive CAD. 50% RCA and 30% LAD.   ECHO 01/2018  EF 35%  Severe concentric LVH with diffuse hypokinesis and paradoxical   septal motion. Thickened  aortic and mitral valve leaflets. Mild   aortic regurgitation and moderate mitral regurgitation. Trivial   posteriorly located pericardial effusion.   Evaluation for cardiac amyloidosis should be considered .  ECHO 2018, the LVEF is improved from   35-40% up to 40-45%. There is persistent moderate mitral   regurgitation.  Review of systems complete and found to be negative unless listed in HPI.    SH:  Social History   Socioeconomic History  . Marital status: Widowed    Spouse name: Not on file  . Number of children: Not on file  . Years of education: Not on file  . Highest education level: Not on file  Occupational History  . Not on file  Social Needs  . Financial resource strain: Not on file  . Food insecurity:    Worry: Not on file    Inability: Not on file  . Transportation needs:    Medical: Not on file    Non-medical: Not on file  Tobacco Use  . Smoking status: Former Smoker    Packs/day: 0.75    Years: 60.00    Pack years: 45.00    Last attempt to quit: 11/04/2016    Years since quitting: 2.1  . Smokeless tobacco: Never Used  Substance and Sexual Activity  . Alcohol use: No  . Drug use: No  . Sexual activity: Not on file  Lifestyle  .  Physical activity:    Days per week: Not on file    Minutes per session: Not on file  . Stress: Not on file  Relationships  . Social connections:    Talks on phone: Not on file    Gets together: Not on file    Attends religious service: Not on file    Active member of club or organization: Not on file    Attends meetings of clubs or organizations: Not on file    Relationship status: Not on file  . Intimate partner violence:    Fear of current or ex partner: Not on file    Emotionally abused: Not on file    Physically abused: Not on file    Forced sexual activity: Not on file  Other Topics Concern  . Not on file  Social History Narrative  . Not on file    FH:  Family History  Problem Relation Age of Onset  .  Heart attack Father   . Heart disease Father   . Cancer Father   . Hypertension Father   . Hypertension Brother   . Stroke Brother   . Hypertension Brother   . Diabetes Son   . Hyperlipidemia Son   . Hypertension Son     Past Medical History:  Diagnosis Date  . Atrial fibrillation ([place])   . Chronic combined systolic and diastolic CHF (congestive heart failure) ([place])    a. LV dysfcuntion felt to be related to PVCs  . Chronic kidney disease   . Diabetes mellitus without complication ([place])   . Hx of echocardiogram    Echo (1/16):  EF 60-65%, no RWMA, Gr 1 DD, mod AI, mod MR, mild LAE  . Hyperkalemia   . Hyperlipidemia   . Hypertension   . Orthostatic hypotension   . PVC (premature ventricular contraction)    a. s/p PVC ablation in 11/2016  . Rheumatic fever     Current Outpatient Medications  Medication Sig Dispense Refill  . acetaminophen (TYLENOL) 500 MG tablet Take 500 mg by mouth daily as needed for moderate pain.    [name] aspirin 81 MG tablet Take 1 tablet (81 mg total) by mouth daily. 30 tablet 11  . atorvastatin (LIPITOR) 20 MG tablet Take 1 tablet (20 mg total) by mouth daily. (Patient taking differently: Take 20 mg by mouth at bedtime. ) 30 tablet 9  . carvedilol (COREG) 6.25 MG tablet Take 1.5 tablets (9.375 mg total) by mouth 2 (two) times daily. 270 tablet 3  . Cholecalciferol (VITAMIN D) 50 MCG (2000 UT) tablet Take 2,000 Units by mouth daily.    . furosemide (LASIX) 40 MG tablet Take 1 tablet (40 mg total) by mouth daily. 30 tablet 6  . hydrALAZINE (APRESOLINE) 25 MG tablet TAKE 1 TABLET BY MOUTH TWICE A DAY 180 tablet 3  . losartan (COZAAR) 50 MG tablet TAKE 1 TABLET BY MOUTH EVERY DAY (Patient taking differently: 25 mg. ) 90 tablet 1  . omeprazole (PRILOSEC) 40 MG capsule Take 40 mg by mouth 2 (two) times daily.     Vladimir Faster Glycol-Propyl Glycol (SYSTANE OP) Place 1 drop into both eyes 2 (two) times daily as needed (dryness).     No current facility-administered  medications for this encounter.     Vitals:   01/01/19 1216  BP: 138/68  Pulse: 72  SpO2: 98%  Weight: 69.4 kg (153 lb)   Filed Weights   01/01/19 1216  Weight: 69.4 kg (153 lb)  Wt Readings from Last 3 Encounters:  01/01/19 69.4 kg (153 lb)  11/06/18 68.2 kg (150 lb 4 oz)  10/29/18 69.1 kg (152 lb 6.4 oz)   PHYSICAL EXAM: General:  Well appearing. No resp difficulty HEENT: normal Neck: supple. no JVD. Carotids 2+ bilat; no bruits. No lymphadenopathy or thryomegaly appreciated. Cor: PMI nondisplaced. Regular rate & rhythm. No rubs, gallops or murmurs. Lungs: clear Abdomen: soft, nontender, nondistended. No hepatosplenomegaly. No bruits or masses. Good bowel sounds. Extremities: no cyanosis, clubbing, rash, edema Neuro: alert & orientedx3, cranial nerves grossly intact. moves all 4 extremities w/o difficulty. Affect pleasant  ECG 10/01/18 NSR LBBB 19ms Personally reviewed   ASSESSMENT & PLAN:  1.Chronic combined CHF: NICM s/p PVC ablation. Now with LBBB. Echo8/18EF 40% (unchanged from prior). - volume status low and cardiac output normal on RHC in 4/19 - Echo 01/2018 LVEF 35-40%, Moderate MR, LVH. Concern for amyloid.  -PYP scan negative for TTR amyloid. Has MGUS and has seen Dr. Beryle Beams but low suspicion for AL amyloid/myeloma. 24 hour urine with no Mspike - Echo today EF stable at 35-40% with moderate MR - Stable NYHA II-III symptoms.  - Volume status stable on exam.   - Continue lasix 40 daily - CPX 5/19 suggested  severe multifactorial limitation but HF predominates. Unfortunately, not candidate for advanced therapies due to age and comorbidities. Improved recently. - With LBBB (~125ms), reduced EF and MR will refer to EP for possible CRT - Continue coreg 9.375 mg twice a day.  - Continue losartan 50 mg bid. (consider Entresto in future) - Continue hydralazine to 25 mg BID. Unable to remember TID  2. Chest pain - Last cath 01/2017 with heavily calcified  but non-obstructive CAD. 50% RCA and 30% LAD. - No s/s ischemia currently -CT chest stable with old granulomatous disease. - Continue ASA and statin  3. PVCs - S/p ablation. Off Amiodarone therapy. PVC burden 2.1%01/2017. No change.   4. LBBB - EF stable 35-40% on echo today + moderate MR. LBBB ~118ms. - Refer to EP for CRT  5. Drop attacks/syncope - No recent occurrences. 30 day monitor unremarkable 10/2017.  6.CKD III - Baseline creatinine 1.7-1.9 - Following with Dr Hollie Salk  7. HTN - Somewhat elevated, but likely will leave as permissive with labile kidney function.   8. PAD - ABIs showed moderate disease BLE. Has seen [facility]. Medical therapy for now. No change.   Glori Bickers, MD  11:54 PM

## 2019-01-13 DIAGNOSIS — R062 Wheezing: Secondary | ICD-10-CM | POA: Diagnosis not present

## 2019-01-13 DIAGNOSIS — I5022 Chronic systolic (congestive) heart failure: Secondary | ICD-10-CM | POA: Diagnosis not present

## 2019-01-21 ENCOUNTER — Other Ambulatory Visit (HOSPITAL_COMMUNITY): Payer: Self-pay | Admitting: Internal Medicine

## 2019-01-23 ENCOUNTER — Other Ambulatory Visit (HOSPITAL_COMMUNITY): Payer: Self-pay | Admitting: Internal Medicine

## 2019-01-27 ENCOUNTER — Telehealth: Payer: Self-pay | Admitting: *Deleted

## 2019-01-27 NOTE — Telephone Encounter (Signed)
Called patient to let them know due to recent [place] and Health Department Protocols, we are not seeing patients in the office. We are instead seeing if they would like to schedule this appointment as a Research scientist (medical) or Laptop. Patient is aware if they decide to reschedule this appointment, they may not be seen or scheduled for the next 4-6 months. Patient at this time declines WebEx Virtual Visits. Patient does not have computer, internet, or smart phone access. Message sent scheduling and nurse.   Currently Patient does not have any concerns or complaints.  She has been walking daily to get exercise.

## 2019-02-03 ENCOUNTER — Institutional Professional Consult (permissible substitution): Payer: Medicare HMO | Admitting: Cardiology

## 2019-02-11 NOTE — Telephone Encounter (Signed)
Pt agreeable to virtual visit w/ [place] tomorrow.     Virtual Visit Pre-Appointment Phone Call  "(Name), I am calling you today to discuss your upcoming appointment. We are currently trying to limit exposure to the virus that causes COVID-19 by seeing patients at home rather than in the office."  1. "What is the BEST phone number to call the day of the visit?" - include this in appointment notes  2. "Do you have or have access to (through a family member/friend) a smartphone with video capability that we can use for your visit?" a. If yes - list this number in appt notes as "cell" (if different from BEST phone #) and list the appointment type as a VIDEO visit in appointment notes b. If no - list the appointment type as a PHONE visit in appointment notes  3. Confirm consent - "In the setting of the current Covid19 crisis, you are scheduled for a (phone or video) visit with your provider on (date) at (time).  Just as we do with many in-office visits, in order for you to participate in this visit, we must obtain consent.  If you'd like, I can send this to your mychart (if signed up) or email for you to review.  Otherwise, I can obtain your verbal consent now.  All virtual visits are billed to your insurance company just like a normal visit would be.  By agreeing to a virtual visit, we'd like you to understand that the technology does not allow for your provider to perform an examination, and thus may limit your provider's ability to fully assess your condition. If your provider identifies any concerns that need to be evaluated in person, we will make arrangements to do so.  Finally, though the technology is pretty good, we cannot assure that it will always work on either your or our end, and in the setting of a video visit, we may have to convert it to a phone-only visit.  In either situation, we cannot ensure that we have a secure connection.  Are you willing to proceed?" STAFF: Did the patient  verbally acknowledge consent to telehealth visit? Document YES/NO here: YES  4. Advise patient to be prepared - "Two hours prior to your appointment, go ahead and check your blood pressure, pulse, oxygen saturation, and your weight (if you have the equipment to check those) and write them all down. When your visit starts, your provider will ask you for this information. If you have an Apple Watch or Kardia device, please plan to have heart rate information ready on the day of your appointment. Please have a pen and paper handy nearby the day of the visit as well."  5. Give patient instructions for MyChart download to smartphone OR [email or web address] as below if video visit (depending on what platform provider is using)  6. Inform patient they will receive a phone call 15 minutes prior to their appointment time (may be from unknown caller ID) so they should be prepared to answer    TELEPHONE CALL NOTE  Krista Mason has been deemed a candidate for a follow-up tele-health visit to limit community exposure during the Covid-19 pandemic. I spoke with the patient via phone to ensure availability of phone/video source, confirm preferred email & phone number, and discuss instructions and expectations.  I reminded Krista Mason to be prepared with any vital sign and/or heart rhythm information that could potentially be obtained via home monitoring, at the time of her visit. I reminded  Krista Mason to expect a phone call prior to her visit.  [name], RN 02/11/2019 12:29 PM   INSTRUCTIONS FOR DOWNLOADING THE MYCHART APP TO SMARTPHONE  - The patient must first make sure to have activated MyChart and know their login information - If Apple, go to [employer] and type in MyChart in the search bar and download the app. If Android, ask patient to go to [employer] and type in [place] in the search bar and download the app. The app is free but as with any other app downloads, their phone may require  them to verify saved payment information or Apple/Android password.  - The patient will need to then log into the app with their MyChart username and password, and select [facility] as their healthcare provider to link the account. When it is time for your visit, go to the MyChart app, find appointments, and click Begin Video Visit. Be sure to Select Allow for your device to access the Microphone and Camera for your visit. You will then be connected, and your provider will be with you shortly.  **If they have any issues connecting, or need assistance please contact MyChart service desk [phone])**  **If using a computer, in order to ensure the best quality for their visit they will need to use either of the following Internet Browsers: [employer], or Google Chrome**  IF USING DOXIMITY or DOXY.ME - The patient will receive a link just prior to their visit by text.     FULL LENGTH CONSENT FOR TELE-HEALTH VISIT   I hereby voluntarily request, consent and authorize [facility] and its employed or contracted physicians, physician assistants, nurse practitioners or other licensed health care professionals (the Practitioner), to provide me with telemedicine health care services (the "Services") as deemed necessary by the treating Practitioner. I acknowledge and consent to receive the Services by the Practitioner via telemedicine. I understand that the telemedicine visit will involve communicating with the Practitioner through live audiovisual communication technology and the disclosure of certain medical information by electronic transmission. I acknowledge that I have been given the opportunity to request an in-person assessment or other available alternative prior to the telemedicine visit and am voluntarily participating in the telemedicine visit.  I understand that I have the right to withhold or withdraw my consent to the use of telemedicine in the course of my care at any  time, without affecting my right to future care or treatment, and that the Practitioner or I may terminate the telemedicine visit at any time. I understand that I have the right to inspect all information obtained and/or recorded in the course of the telemedicine visit and may receive copies of available information for a reasonable fee.  I understand that some of the potential risks of receiving the Services via telemedicine include:  [name] Delay or interruption in medical evaluation due to technological equipment failure or disruption; . Information transmitted may not be sufficient (e.g. poor resolution of images) to allow for appropriate medical decision making by the Practitioner; and/or  . In rare instances, security protocols could fail, causing a breach of personal health information.  Furthermore, I acknowledge that it is my responsibility to provide information about my medical history, conditions and care that is complete and accurate to the best of my ability. I acknowledge that Practitioner's advice, recommendations, and/or decision may be based on factors not within their control, such as incomplete or inaccurate data provided by me or distortions of  diagnostic images or specimens that may result from electronic transmissions. I understand that the practice of medicine is not an exact science and that Practitioner makes no warranties or guarantees regarding treatment outcomes. I acknowledge that I will receive a copy of this consent concurrently upon execution via email to the email address I last provided but may also request a printed copy by calling the office of [facility].    I understand that my insurance will be billed for this visit.   I have read or had this consent read to me. . I understand the contents of this consent, which adequately explains the benefits and risks of the Services being provided via telemedicine.  . I have been provided ample opportunity to ask questions  regarding this consent and the Services and have had my questions answered to my satisfaction. . I give my informed consent for the services to be provided through the use of telemedicine in my medical care  By participating in this telemedicine visit I agree to the above.

## 2019-02-12 ENCOUNTER — Other Ambulatory Visit: Payer: Self-pay

## 2019-02-12 ENCOUNTER — Telehealth (INDEPENDENT_AMBULATORY_CARE_PROVIDER_SITE_OTHER): Payer: Medicare HMO | Admitting: Cardiology

## 2019-02-12 ENCOUNTER — Encounter: Payer: Self-pay | Admitting: Cardiology

## 2019-02-12 DIAGNOSIS — I5022 Chronic systolic (congestive) heart failure: Secondary | ICD-10-CM | POA: Diagnosis not present

## 2019-02-12 NOTE — Progress Notes (Signed)
Electrophysiology TeleHealth Note   Due to national recommendations of social distancing due to COVID 19, an audio/video telehealth visit is felt to be most appropriate for this patient at this time.  See Epic message for the patient's consent to telehealth for [facility].   Date:  02/14/2019   ID:  Krista Mason, DOB 12/04/1935, MRN [id number]  Location: patient's home  Provider location: [address], [place]  Evaluation Performed: Follow-up visit  PCP:  Leighton Ruff, MD  Cardiologist: Ronna Polio Electrophysiologist:  Dr Curt Bears  Chief Complaint: CHF  History of Present Illness:    Krista Mason is a 83 y.o. female who presents via audio/video conferencing for a telehealth visit today.  Since last being seen in our clinic, the patient reports doing very well.  Today, she denies symptoms of palpitations, chest pain, shortness of breath,  lower extremity edema, dizziness, presyncope, or syncope.  The patient is otherwise without complaint today.  The patient denies symptoms of fevers, chills, cough, or new SOB worrisome for COVID 19.   Today, denies symptoms of palpitations, chest pain, shortness of breath, orthopnea, PND, lower extremity edema, claudication, dizziness, presyncope, syncope, bleeding, or neurologic sequela. The patient is tolerating medications without difficulties.  She has a history of chronic systolic heart failure with a left bundle branch block and PVCs on amiodarone.  Over the past few months, she has been getting more short of breath.  She is not able to do as many of her daily activities.  She gets short of breath with talking, but otherwise has been feeling well.  Past Medical History:  Diagnosis Date  . Atrial fibrillation ([place])   . Chronic combined systolic and diastolic CHF (congestive heart failure) ([place])    a. LV dysfcuntion felt to be related to PVCs  . Chronic kidney disease   . Diabetes mellitus without complication ([place])    . Hx of echocardiogram    Echo (1/16):  EF 60-65%, no RWMA, Gr 1 DD, mod AI, mod MR, mild LAE  . Hyperkalemia   . Hyperlipidemia   . Hypertension   . Orthostatic hypotension   . PVC (premature ventricular contraction)    a. s/p PVC ablation in 11/2016  . Rheumatic fever     Past Surgical History:  Procedure Laterality Date  . ESOPHAGOGASTRODUODENOSCOPY (EGD) WITH PROPOFOL N/A 09/25/2018   Procedure: ESOPHAGOGASTRODUODENOSCOPY (EGD) WITH PROPOFOL;  Surgeon: Ronald Lobo, MD;  Location: [facility];  Service: Endoscopy;  Laterality: N/A;  . ESOPHAGOGASTRODUODENOSCOPY (EGD) WITH PROPOFOL N/A 10/29/2018   Procedure: ESOPHAGOGASTRODUODENOSCOPY (EGD) WITH PROPOFOL;  Surgeon: Ronald Lobo, MD;  Location: [facility];  Service: Endoscopy;  Laterality: N/A;  . KNEE SURGERY Left 2009  . OVARIAN CYST REMOVAL    . PVC ABLATION N/A 11/30/2016   Procedure: PVC Ablation;  Surgeon: Zhoey Blackstock [name], MD;  Location: [facility];  Service: Cardiovascular;  Laterality: N/A;  . RIGHT HEART CATH N/A 01/31/2018   Procedure: RIGHT HEART CATH;  Surgeon: Jolaine Artist, MD;  Location: [facility];  Service: Cardiovascular;  Laterality: N/A;  . RIGHT/LEFT HEART CATH AND CORONARY ANGIOGRAPHY N/A 01/25/2017   Procedure: Right/Left Heart Cath and Coronary Angiography;  Surgeon: Jolaine Artist, MD;  Location: [facility];  Service: Cardiovascular;  Laterality: N/A;  . SAVORY DILATION N/A 09/25/2018   Procedure: SAVORY DILATION;  Surgeon: Ronald Lobo, MD;  Location: [facility];  Service: Endoscopy;  Laterality: N/A;  . SAVORY DILATION  N/A 10/29/2018   Procedure: SAVORY DILATION;  Surgeon: Ronald Lobo, MD;  Location: [facility];  Service: Endoscopy;  Laterality: N/A;    Current Outpatient Medications  Medication Sig Dispense Refill  . acetaminophen (TYLENOL) 500 MG tablet Take 500 mg by mouth daily as needed for moderate pain.    [name] aspirin 81 MG tablet Take 1 tablet (81 mg  total) by mouth daily. 30 tablet 11  . atorvastatin (LIPITOR) 20 MG tablet TAKE 1 TABLET BY MOUTH EVERY DAY 90 tablet 3  . carvedilol (COREG) 6.25 MG tablet Take 1.5 tablets (9.375 mg total) by mouth 2 (two) times daily. 270 tablet 3  . Cholecalciferol (VITAMIN D) 50 MCG (2000 UT) tablet Take 2,000 Units by mouth daily.    . furosemide (LASIX) 40 MG tablet TAKE 1 TABLET BY MOUTH EVERY DAY 90 tablet 2  . hydrALAZINE (APRESOLINE) 25 MG tablet TAKE 0.5 TABLETS BY MOUTH 3 TIMES DAILY. 135 tablet 1  . losartan (COZAAR) 50 MG tablet TAKE 1 TABLET BY MOUTH EVERY DAY (Patient taking differently: 25 mg. ) 90 tablet 1  . omeprazole (PRILOSEC) 40 MG capsule Take 40 mg by mouth 2 (two) times daily.     Vladimir Faster Glycol-Propyl Glycol (SYSTANE OP) Place 1 drop into both eyes 2 (two) times daily as needed (dryness).     No current facility-administered medications for this visit.     Allergies:   Penicillins; Ceclor [cefaclor]; Crestor [rosuvastatin calcium]; Epinephrine; Influenza vaccines; Nsaids; Percocet [oxycodone-acetaminophen]; and Prednisone   Social History:  The patient  reports that she quit smoking about 2 years ago. She has a 45.00 pack-year smoking history. She has never used smokeless tobacco. She reports that she does not drink alcohol or use drugs.   Family History:  The patient's  family history includes Cancer in her father; Diabetes in her son; Heart attack in her father; Heart disease in her father; Hyperlipidemia in her son; Hypertension in her brother, brother, father, and son; Stroke in her brother.   ROS:  Please see the history of present illness.   All other systems are personally reviewed and negative.    Exam:    Vital Signs:  Pulse 62   SpO2 97%   Over the phone, no acute distress, no shortness of breath.  Labs/Other Tests and Data Reviewed:    Recent Labs: 07/30/2018: B Natriuretic Peptide 362.4; BUN 40; Creatinine, Ser 2.34; Potassium 4.6; Sodium 136   Wt Readings  from Last 3 Encounters:  01/01/19 153 lb (69.4 kg)  11/06/18 150 lb 4 oz (68.2 kg)  10/29/18 152 lb 6.4 oz (69.1 kg)     Other studies personally reviewed: Additional studies/ records that were reviewed today include: ECG 10/01/18 - personally reviewed  Review of the above records today demonstrates:   SR, LBBB  ASSESSMENT & PLAN:    1.  Chronic systolic heart failure: on coreg, losartan, hydralazine.  Unfortunately, she does continue to have symptoms of shortness of breath.  Her shortness of breath symptoms have actually gotten worse over the last few weeks.We discussed the option of possible CRT-P implant. The patient would like to wait until after COVID issues have passed prior to discussing further, but she is interested. Babak Lucus continue current management.  2. PVCs: 2.1% on amiodarone     COVID 19 screen The patient denies symptoms of COVID 19 at this time.  The importance of social distancing was discussed today.  Follow-up: 3 months  Current medicines are reviewed at length  with the patient today.   The patient does not have concerns regarding her medicines.  The following changes were made today:  none  Labs/ tests ordered today include:  No orders of the defined types were placed in this encounter.  This visit was done as a telephone visit.  The patient does not have a smart phone nor a computer to do a video visit.  Patient Risk:  after full review of this patients clinical status, I feel that they are at moderate risk at this time.  Today, I have spent 12 minutes with the patient with telehealth technology discussing heart failure, pacemaker.    Signed, Kel Senn [name], MD  02/14/2019 2:08 PM     [facility] [address] [place] [address] [phone] (office) [phone] (fax)

## 2019-02-13 DIAGNOSIS — I5022 Chronic systolic (congestive) heart failure: Secondary | ICD-10-CM | POA: Diagnosis not present

## 2019-02-13 DIAGNOSIS — R062 Wheezing: Secondary | ICD-10-CM | POA: Diagnosis not present

## 2019-03-09 ENCOUNTER — Other Ambulatory Visit (HOSPITAL_COMMUNITY): Payer: Self-pay | Admitting: Internal Medicine

## 2019-03-15 DIAGNOSIS — I5022 Chronic systolic (congestive) heart failure: Secondary | ICD-10-CM | POA: Diagnosis not present

## 2019-03-15 DIAGNOSIS — R062 Wheezing: Secondary | ICD-10-CM | POA: Diagnosis not present

## 2019-03-29 ENCOUNTER — Other Ambulatory Visit (HOSPITAL_COMMUNITY): Payer: Self-pay | Admitting: Adult Health

## 2019-04-03 ENCOUNTER — Other Ambulatory Visit (HOSPITAL_COMMUNITY): Payer: Self-pay

## 2019-04-03 MED ORDER — HYDRALAZINE HCL 25 MG PO TABS: 25.0000 mg | ORAL_TABLET | Freq: Two times a day (BID) | ORAL | 2 refills | Status: DC

## 2019-04-15 DIAGNOSIS — I5022 Chronic systolic (congestive) heart failure: Secondary | ICD-10-CM | POA: Diagnosis not present

## 2019-04-15 DIAGNOSIS — R062 Wheezing: Secondary | ICD-10-CM | POA: Diagnosis not present

## 2019-05-15 DIAGNOSIS — I5022 Chronic systolic (congestive) heart failure: Secondary | ICD-10-CM | POA: Diagnosis not present

## 2019-05-15 DIAGNOSIS — R062 Wheezing: Secondary | ICD-10-CM | POA: Diagnosis not present

## 2019-06-11 ENCOUNTER — Encounter (HOSPITAL_COMMUNITY): Payer: Self-pay | Admitting: Internal Medicine

## 2019-06-11 ENCOUNTER — Ambulatory Visit (HOSPITAL_COMMUNITY)
Admission: RE | Admit: 2019-06-11 | Discharge: 2019-06-11 | Disposition: A | Discharge status: Home / Self Care | Payer: Medicare HMO | Source: Ambulatory Visit | Attending: Internal Medicine | Admitting: Internal Medicine

## 2019-06-11 ENCOUNTER — Telehealth (HOSPITAL_COMMUNITY): Payer: Self-pay

## 2019-06-11 ENCOUNTER — Encounter (HOSPITAL_COMMUNITY): Payer: Self-pay

## 2019-06-11 ENCOUNTER — Other Ambulatory Visit: Payer: Self-pay

## 2019-06-11 VITALS — BP 162/80 | HR 63 | Wt 153.9 lb

## 2019-06-11 DIAGNOSIS — I5022 Chronic systolic (congestive) heart failure: Secondary | ICD-10-CM | POA: Diagnosis not present

## 2019-06-11 DIAGNOSIS — E1122 Type 2 diabetes mellitus with diabetic chronic kidney disease: Secondary | ICD-10-CM | POA: Diagnosis not present

## 2019-06-11 DIAGNOSIS — I130 Hypertensive heart and chronic kidney disease with heart failure and stage 1 through stage 4 chronic kidney disease, or unspecified chronic kidney disease: Secondary | ICD-10-CM | POA: Diagnosis not present

## 2019-06-11 DIAGNOSIS — J984 Other disorders of lung: Secondary | ICD-10-CM | POA: Diagnosis not present

## 2019-06-11 DIAGNOSIS — R0602 Shortness of breath: Secondary | ICD-10-CM | POA: Insufficient documentation

## 2019-06-11 DIAGNOSIS — Z8249 Family history of ischemic heart disease and other diseases of the circulatory system: Secondary | ICD-10-CM | POA: Diagnosis not present

## 2019-06-11 DIAGNOSIS — I2510 Atherosclerotic heart disease of native coronary artery without angina pectoris: Secondary | ICD-10-CM | POA: Diagnosis not present

## 2019-06-11 DIAGNOSIS — I447 Left bundle-branch block, unspecified: Secondary | ICD-10-CM | POA: Diagnosis not present

## 2019-06-11 DIAGNOSIS — N183 Chronic kidney disease, stage 3 unspecified: Secondary | ICD-10-CM

## 2019-06-11 DIAGNOSIS — D472 Monoclonal gammopathy: Secondary | ICD-10-CM | POA: Insufficient documentation

## 2019-06-11 DIAGNOSIS — Z7982 Long term (current) use of aspirin: Secondary | ICD-10-CM | POA: Diagnosis not present

## 2019-06-11 DIAGNOSIS — I340 Nonrheumatic mitral (valve) insufficiency: Secondary | ICD-10-CM | POA: Insufficient documentation

## 2019-06-11 DIAGNOSIS — I428 Other cardiomyopathies: Secondary | ICD-10-CM | POA: Diagnosis not present

## 2019-06-11 DIAGNOSIS — Z79899 Other long term (current) drug therapy: Secondary | ICD-10-CM | POA: Insufficient documentation

## 2019-06-11 DIAGNOSIS — I5042 Chronic combined systolic (congestive) and diastolic (congestive) heart failure: Secondary | ICD-10-CM | POA: Insufficient documentation

## 2019-06-11 DIAGNOSIS — Z87891 Personal history of nicotine dependence: Secondary | ICD-10-CM | POA: Insufficient documentation

## 2019-06-11 DIAGNOSIS — Z7901 Long term (current) use of anticoagulants: Secondary | ICD-10-CM | POA: Diagnosis not present

## 2019-06-11 DIAGNOSIS — I4891 Unspecified atrial fibrillation: Secondary | ICD-10-CM | POA: Diagnosis not present

## 2019-06-11 DIAGNOSIS — E785 Hyperlipidemia, unspecified: Secondary | ICD-10-CM | POA: Diagnosis not present

## 2019-06-11 DIAGNOSIS — R079 Chest pain, unspecified: Secondary | ICD-10-CM | POA: Insufficient documentation

## 2019-06-11 LAB — COMPREHENSIVE METABOLIC PANEL
ALT: 16 U/L (ref 0–44)
AST: 16 U/L (ref 15–41)
Albumin: 4 g/dL (ref 3.5–5.0)
Alkaline Phosphatase: 89 U/L (ref 38–126)
Anion gap: 12 (ref 5–15)
BUN: 43 mg/dL — ABNORMAL HIGH (ref 8–23)
CO2: 22 mmol/L (ref 22–32)
Calcium: 9.9 mg/dL (ref 8.9–10.3)
Chloride: 101 mmol/L (ref 98–111)
Creatinine, Ser: 2.53 mg/dL — ABNORMAL HIGH (ref 0.44–1.00)
GFR calc Af Amer: 20 mL/min — ABNORMAL LOW (ref >60)
GFR calc non Af Amer: 17 mL/min — ABNORMAL LOW (ref >60)
Glucose, Bld: 148 mg/dL — ABNORMAL HIGH (ref 70–99)
Potassium: 4.4 mmol/L (ref 3.5–5.1)
Sodium: 135 mmol/L (ref 135–145)
Total Bilirubin: 1 mg/dL (ref 0.3–1.2)
Total Protein: 7.2 g/dL (ref 6.5–8.1)

## 2019-06-11 LAB — CBC
HCT: 37 % (ref 36.0–46.0)
Hemoglobin: 12.4 g/dL (ref 12.0–15.0)
MCH: 31 pg (ref 26.0–34.0)
MCHC: 33.5 g/dL (ref 30.0–36.0)
MCV: 92.5 fL (ref 80.0–100.0)
Platelets: 238 10*3/uL (ref 150–400)
RBC: 4 MIL/uL (ref 3.87–5.11)
RDW: 13.7 % (ref 11.5–15.5)
WBC: 8.1 10*3/uL (ref 4.0–10.5)
nRBC: 0 % (ref 0.0–0.2)

## 2019-06-11 LAB — HEMOGLOBIN A1C
Hgb A1c MFr Bld: 6.9 % — ABNORMAL HIGH (ref 4.8–5.6)
Mean Plasma Glucose: 151.33 mg/dL

## 2019-06-11 LAB — BRAIN NATRIURETIC PEPTIDE: B Natriuretic Peptide: 131.8 pg/mL — ABNORMAL HIGH (ref 0.0–100.0)

## 2019-06-11 MED ORDER — SACUBITRIL-VALSARTAN 24-26 MG PO TABS: 1.0000 | ORAL_TABLET | Freq: Two times a day (BID) | ORAL | 3 refills | Status: DC

## 2019-06-11 MED ORDER — FUROSEMIDE 40 MG PO TABS: 40.0000 mg | ORAL_TABLET | ORAL | 3 refills | Status: DC

## 2019-06-11 MED ORDER — HYDRALAZINE HCL 50 MG PO TABS: 50.0000 mg | ORAL_TABLET | Freq: Two times a day (BID) | ORAL | 3 refills | Status: DC

## 2019-06-11 NOTE — Progress Notes (Signed)
Advanced Heart Failure Clinic Note    Primary Cardiologist: Dr Vaughan Browner Nephrologist: Dr Hollie Salk  HPI: Tawonda Legaspi Berendt is a 83 y.o. female with a history of HTN, HL, CKD 3, moderate mitral regurgitation, DM, and chronic systolic heart failure .  Admitted 01/28/18 with dyspnea. ECHO had gone down to 35-40% (previously 45%). Had RHC with normal filling pressures and cardiac output. Creatinine peaked at 2.1 so lasix was held for few days after discharge. Myeloma panel - 0.3 M protein noted. PYP scan was negative for TTR. Has seen Dr. Beryle Beams and not felt to have myeloma.   Has had CPX test 02/2018 which showed - submax test with moderate to severe functional limitation due to HF, lung disease and deconditioning. However, markedly elevated Ve/VCO2 slope points to HF as being the predominant limitation and would suggest work-up for advanced therapies if patient is a candidate.   She presents today for regular follow up. We last saw her in 3/20 and had ongoing NYHA II-III symptoms. Referred to EP to consider CRT. Saw Dr. Curt Bears in April and she wanted to hold off on deciding until after Covid situation improved. Returns for routine f/u with her daughter. Has been on "lockdown" at home. Numerous complaints about symptoms and other providers. Walking a bit around the house. Doing 45 mins of chair exercises per day. No CP, edema. Orthopnea or PND. Remains SOB with mild exertion. SBP at home 130-160   Echo 3/20 shows 35-40% moderate MR. RV ok Personally reviewed   5/19 CPX FVC 1.97 (73%)    FEV1 1.24 (61%)     FEV1/FVC 63 (81%)     MVV 49 (59%)     Resting HR: 58 Peak HR: 110  (78% age predicted max HR) BP rest: 152/66 BP peak: 214/70 Peak VO2: 9.6 (57% predicted peak VO2) VE/VCO2 slope: 50 OUES: 0.87 Peak RER: 0.93 VE/MVV: 70% O2pulse: 6  (75% predicted O2pulse)   01/2018 PYP scan negative for TTR  RHC 01/2018  RA = 3 RV = 29/3 PA = 25/12 (16) PCW = 8 Fick cardiac  output/index = 5.3/3.0 PVR = 1.5 WU Ao sat = 97% PA sat = 65%, 67% Assessment: 1. Normal filling pressures and outputs  LHC 01/2017  Heavily calcified but non-obstructive CAD. 50% RCA and 30% LAD.   ECHO 01/2018  EF 35%  Severe concentric LVH with diffuse hypokinesis and paradoxical   septal motion. Thickened aortic and mitral valve leaflets. Mild   aortic regurgitation and moderate mitral regurgitation. Trivial   posteriorly located pericardial effusion.   Evaluation for cardiac amyloidosis should be considered .  ECHO 2018, the LVEF is improved from   35-40% up to 40-45%. There is persistent moderate mitral   regurgitation.  Review of systems complete and found to be negative unless listed in HPI.    SH:  Social History   Socioeconomic History  . Marital status: Widowed    Spouse name: Not on file  . Number of children: Not on file  . Years of education: Not on file  . Highest education level: Not on file  Occupational History  . Not on file  Social Needs  . Financial resource strain: Not on file  . Food insecurity    Worry: Not on file    Inability: Not on file  . Transportation needs    Medical: Not on file    Non-medical: Not on file  Tobacco Use  . Smoking status: Former Smoker    Packs/day: 0.75  Years: 60.00    Pack years: 51.00    Quit date: 11/04/2016    Years since quitting: 2.6  . Smokeless tobacco: Never Used  Substance and Sexual Activity  . Alcohol use: No  . Drug use: No  . Sexual activity: Not on file  Lifestyle  . Physical activity    Days per week: Not on file    Minutes per session: Not on file  . Stress: Not on file  Relationships  . Social Herbalist on phone: Not on file    Gets together: Not on file    Attends religious service: Not on file    Active member of club or organization: Not on file    Attends meetings of clubs or organizations: Not on file    Relationship status: Not on file  . Intimate partner  violence    Fear of current or ex partner: Not on file    Emotionally abused: Not on file    Physically abused: Not on file    Forced sexual activity: Not on file  Other Topics Concern  . Not on file  Social History Narrative  . Not on file    FH:  Family History  Problem Relation Age of Onset  . Heart attack Father   . Heart disease Father   . Cancer Father   . Hypertension Father   . Hypertension Brother   . Stroke Brother   . Hypertension Brother   . Diabetes Son   . Hyperlipidemia Son   . Hypertension Son     Past Medical History:  Diagnosis Date  . Atrial fibrillation ([place])   . Chronic combined systolic and diastolic CHF (congestive heart failure) ([place])    a. LV dysfcuntion felt to be related to PVCs  . Chronic kidney disease   . Diabetes mellitus without complication ([place])   . Hx of echocardiogram    Echo (1/16):  EF 60-65%, no RWMA, Gr 1 DD, mod AI, mod MR, mild LAE  . Hyperkalemia   . Hyperlipidemia   . Hypertension   . Orthostatic hypotension   . PVC (premature ventricular contraction)    a. s/p PVC ablation in 11/2016  . Rheumatic fever     Current Outpatient Medications  Medication Sig Dispense Refill  . acetaminophen (TYLENOL) 500 MG tablet Take 500 mg by mouth daily as needed for moderate pain.    [name] aspirin 81 MG tablet Take 1 tablet (81 mg total) by mouth daily. 30 tablet 11  . atorvastatin (LIPITOR) 20 MG tablet TAKE 1 TABLET BY MOUTH EVERY DAY 90 tablet 3  . carvedilol (COREG) 6.25 MG tablet TAKE 1.5 TABLETS (9.375 MG TOTAL) BY MOUTH 2 (TWO) TIMES DAILY 270 tablet 3  . Cholecalciferol (VITAMIN D) 50 MCG (2000 UT) tablet Take 2,000 Units by mouth daily.    . furosemide (LASIX) 40 MG tablet TAKE 1 TABLET BY MOUTH EVERY DAY 90 tablet 2  . hydrALAZINE (APRESOLINE) 25 MG tablet Take 1 tablet (25 mg total) by mouth 2 (two) times a day. 180 tablet 2  . losartan (COZAAR) 25 MG tablet Take 25 mg by mouth daily.    [name] omeprazole (PRILOSEC) 40 MG capsule Take  40 mg by mouth 2 (two) times daily.     Vladimir Faster Glycol-Propyl Glycol (SYSTANE OP) Place 1 drop into both eyes 2 (two) times daily as needed (dryness).     No current facility-administered medications for this encounter.     Vitals:  06/11/19 1126  BP: (!) 162/80  Pulse: 63  SpO2: 96%  Weight: 69.8 kg (153 lb 14.4 oz)   Filed Weights   06/11/19 1126  Weight: 69.8 kg (153 lb 14.4 oz)   Wt Readings from Last 3 Encounters:  06/11/19 69.8 kg (153 lb 14.4 oz)  01/01/19 69.4 kg (153 lb)  11/06/18 68.2 kg (150 lb 4 oz)   PHYSICAL EXAM: General:  Elderly. No resp difficulty HEENT: normal Neck: supple. no JVD. Carotids 2+ bilat; no bruits. No lymphadenopathy or thryomegaly appreciated. Cor: PMI nondisplaced. Regular rate & rhythm. No rubs, gallops or murmurs. Lungs: clear with decreased BS throughout Abdomen: soft, nontender, nondistended. No hepatosplenomegaly. No bruits or masses. Good bowel sounds. Extremities: no cyanosis, clubbing, rash, edema Neuro: alert & orientedx3, cranial nerves grossly intact. moves all 4 extremities w/o difficulty. Affect pleasant  ECG 06/11/19 NSR 78 1AVB LBBB 112ms + PACs Personally reviewed   ASSESSMENT & PLAN:  1.Chronic combined CHF: NICM s/p PVC ablation. Now with LBBB. Echo8/18EF 40% (unchanged from prior). - volume status low and cardiac output normal on RHC in 4/19 - Echo 01/2018 LVEF 35-40%, Moderate MR, LVH. Concern for amyloid.  -PYP scan negative for TTR amyloid. Has MGUS and has seen Dr. Beryle Beams but low suspicion for AL amyloid/myeloma. 24 hour urine with no Mspike - Echo 3/20 EF stable at 35-40% with moderate MR - CPX 5/19 suggested  severe multifactorial limitation but HF predominates. Unfortunately, not candidate for advanced therapies due to age and comorbidities. - Multiple complaints today but seems to have somewhat progressive NYHA III symptoms though hard to tell with COVID inactivity.  - Volume status stable on exam.    - Continue lasix 40 daily - With LBBB (~171ms), reduced EF and MR will refer back to EP for possible CRT - Continue coreg 9.375 mg twice a day.  - Switch losartan 50 mg bid to Entresto 24/26 bid. Will ned to follow renal function closely. (If GFR < 30 will have to stop) See pharmD in 2 weeks to f/u and potentially increase.  - Continue hydralazine to 25 mg BID. Unable to remember TID. Can wean as Delene Loll goes up   2. Chest pain - Last cath 01/2017 with heavily calcified but non-obstructive CAD. 50% RCA and 30% LAD. - No s/s ischemia currently -CT chest stable with old granulomatous disease. - Continue ASA and statin  3. PVCs - S/p ablation. Off Amiodarone therapy. PVC burden 2.1%01/2017. No change.   4. LBBB - EF stable 35-40% on echo 3/20 + moderate MR. LBBB ~170ms. - F/u with EP for CRT  5. Drop attacks/syncope - No recent occurrences. 30 day monitor unremarkable 10/2017.  6.CKD III - Baseline creatinine 1.7-1.9. Last value in 10/19 creatinine 2.3 - Following with Dr Hollie Salk - Recehck today   7. HTN - Remains elevated. Titrated meds with goal SBP 120-140. Avoid hypotension  8. PAD - ABIs showed moderate disease BLE. Has seen [facility]. Medical therapy for now. No change.   Glori Bickers, MD  11:44 AM

## 2019-06-11 NOTE — Telephone Encounter (Signed)
-----   Message from Jolaine Artist, MD sent at 06/11/2019  1:04 PM EDT ----- Creatinine continues to climb. Stop losartan. Don not get [employer].  Increase hydralazine to 50 bid  Switch lasix to every other day.  Repeat labs 1 week

## 2019-06-11 NOTE — Patient Instructions (Signed)
Labs were drawn today. We will ONLY contact you if there is anything ABNORMAL.  EKG was done today.  Your physician has requested that you have an echocardiogram. Echocardiography is a painless test that uses sound waves to create images of your heart. It provides your doctor with information about the size and shape of your heart and how well your heart's chambers and valves are working. This procedure takes approximately one hour. There are no restrictions for this procedure.

## 2019-06-11 NOTE — Progress Notes (Signed)
Pt requested note with med changes from Dr. Haroldine Laws. Sent.

## 2019-06-11 NOTE — Telephone Encounter (Signed)
Pt aware. Pt requested letter with changes written. Letter sent. Labs to be drawn at [facility] next week.

## 2019-06-12 ENCOUNTER — Telehealth: Payer: Self-pay | Admitting: Cardiology

## 2019-06-12 NOTE — Telephone Encounter (Signed)
Per call from pt's daughter pt needs a sooner appt for eval of pacemaker please give her a call to schedule

## 2019-06-12 NOTE — Telephone Encounter (Signed)
Spoke with the pts daughter, Hans Eden [phone] and she reports the pt saw Dr. Haroldine Laws yesterday and they discussed the pt going ahead with her pacer implant.... she says they are hoping to have an added OV to discuss with Dr. Curt Bears prior to proceeding but they are accepting this as her plan.. She is okay with waiting for Sherri's return next week to talk about all of this.. will forward to [facility] and to Dr. Curt Bears for review.   Will need Sherri to review Dr. Macky Lower schedule since next available 08/2019.

## 2019-06-15 DIAGNOSIS — I5022 Chronic systolic (congestive) heart failure: Secondary | ICD-10-CM | POA: Diagnosis not present

## 2019-06-15 DIAGNOSIS — R062 Wheezing: Secondary | ICD-10-CM | POA: Diagnosis not present

## 2019-06-16 NOTE — Telephone Encounter (Signed)
Returned dtr's call. Scheduled pt for 9/8 OV w/ [place] to discuss further.  Dtr will accompany pt to this appt.

## 2019-06-17 ENCOUNTER — Telehealth (HOSPITAL_COMMUNITY): Payer: Self-pay

## 2019-06-17 DIAGNOSIS — I5022 Chronic systolic (congestive) heart failure: Secondary | ICD-10-CM

## 2019-06-17 NOTE — Telephone Encounter (Signed)
Pt called stating that [facility] does not have order for lab requested by our office. bmet faxed to [facility] [phone]. To be done tomorrow

## 2019-06-18 ENCOUNTER — Other Ambulatory Visit: Payer: Self-pay | Admitting: Internal Medicine

## 2019-06-18 DIAGNOSIS — I5022 Chronic systolic (congestive) heart failure: Secondary | ICD-10-CM | POA: Diagnosis not present

## 2019-06-19 LAB — BASIC METABOLIC PANEL
BUN/Creatinine Ratio: 14 (ref 12–28)
BUN: 33 mg/dL — ABNORMAL HIGH (ref 8–27)
CO2: 22 mmol/L (ref 20–29)
Calcium: 9.7 mg/dL (ref 8.7–10.3)
Chloride: 97 mmol/L (ref 96–106)
Creatinine, Ser: 2.31 mg/dL — ABNORMAL HIGH (ref 0.57–1.00)
GFR calc Af Amer: 22 mL/min/{1.73_m2} — ABNORMAL LOW (ref >59)
GFR calc non Af Amer: 19 mL/min/{1.73_m2} — ABNORMAL LOW (ref >59)
Glucose: 228 mg/dL — ABNORMAL HIGH (ref 65–99)
Potassium: 4.4 mmol/L (ref 3.5–5.2)
Sodium: 137 mmol/L (ref 134–144)

## 2019-06-23 ENCOUNTER — Other Ambulatory Visit (HOSPITAL_COMMUNITY): Payer: Self-pay | Admitting: Internal Medicine

## 2019-06-26 DIAGNOSIS — I4891 Unspecified atrial fibrillation: Secondary | ICD-10-CM | POA: Diagnosis not present

## 2019-06-26 DIAGNOSIS — N189 Chronic kidney disease, unspecified: Secondary | ICD-10-CM | POA: Diagnosis not present

## 2019-06-26 DIAGNOSIS — I129 Hypertensive chronic kidney disease with stage 1 through stage 4 chronic kidney disease, or unspecified chronic kidney disease: Secondary | ICD-10-CM | POA: Diagnosis not present

## 2019-06-26 DIAGNOSIS — N184 Chronic kidney disease, stage 4 (severe): Secondary | ICD-10-CM | POA: Diagnosis not present

## 2019-06-26 DIAGNOSIS — I5022 Chronic systolic (congestive) heart failure: Secondary | ICD-10-CM | POA: Diagnosis not present

## 2019-06-26 DIAGNOSIS — E559 Vitamin D deficiency, unspecified: Secondary | ICD-10-CM | POA: Diagnosis not present

## 2019-06-26 DIAGNOSIS — D631 Anemia in chronic kidney disease: Secondary | ICD-10-CM | POA: Diagnosis not present

## 2019-06-30 ENCOUNTER — Ambulatory Visit (INDEPENDENT_AMBULATORY_CARE_PROVIDER_SITE_OTHER): Payer: Medicare HMO | Admitting: Cardiology

## 2019-06-30 ENCOUNTER — Encounter: Payer: Self-pay | Admitting: Cardiology

## 2019-06-30 ENCOUNTER — Other Ambulatory Visit: Payer: Self-pay

## 2019-06-30 VITALS — BP 140/80 | HR 84 | Ht 64.5 in | Wt 154.2 lb

## 2019-06-30 DIAGNOSIS — I428 Other cardiomyopathies: Secondary | ICD-10-CM

## 2019-06-30 DIAGNOSIS — Z01812 Encounter for preprocedural laboratory examination: Secondary | ICD-10-CM | POA: Diagnosis not present

## 2019-06-30 NOTE — Patient Instructions (Signed)
Medication Instructions:  Your physician recommends that you continue on your current medications as directed. Please refer to the Current Medication list given to you today.     * If you need a refill on your cardiac medications before your next appointment, please call your pharmacy. *   Labwork: Pre procedure lab work today: BMET & CBC If you have labs (blood work) drawn today and your tests are completely normal, you will receive your results only by:  [employer] (if you have MyChart) OR  A paper copy in the mail If you have any lab test that is abnormal or we need to change your treatment, we will call you to review the results.   Testing/Procedures: Your physician has recommended that you have a pacemaker inserted. A pacemaker is a small device that is placed under the skin of your chest or abdomen to help control abnormal heart rhythms. This device uses electrical pulses to prompt the heart to beat at a normal rate. Pacemakers are used to treat heart rhythms that are too slow. Wire (leads) are attached to the pacemaker that goes into the chambers of you heart. This is done in the hospital and usually requires and overnight stay. Please follow the instructions below, located under the special instructions section.   Follow-Up: Your physician recommends that you schedule a wound check appointment 10-14 days, after your procedure on 07/24/19, with the device clinic.  Your physician recommends that you schedule a follow up appointment in 91 days, after your procedure on 07/24/19, with Dr. Curt Bears.  Thank you for choosing [facility]!!   Trinidad Curet, RN [phone]   Any Other Special Instructions Will Be Listed Below (If Applicable).   Implantable Device Instructions  You are scheduled for: BiVentricular pacemaker on 07/24/19 with Dr. Curt Bears.  1.   Pre procedure testing-             A.  LAB WORK--- On 06/30/19  You do not need to be fasting.                B.  COVID TEST-- On 07/21/19 @ 2:55 pm - You will go to [facility] hospital ([address] [place]) for your Covid testing.   This is a drive thru test site.  There will be multiple testing areas.  Be sure to share with the first checkpoint that you are there for pre-procedure/surgery testing. This will put you into the right (yellow) lane that leads to the PAT testing team.   Stay in your car and the nurse team will come to your car to test you.  After you are tested please go home and self quarantine until the day of your procedure.    2. On the day of your procedure 07/24/19 you will go to [facility] [phone]) at 10:30 am.  [name] will go to the main entrance [address] [employer]) and enter where the [employer] are.  You will check in at ADMITTING.  You may have one support person come in to the hospital with you.  They will be asked to wait in the waiting room.   3.   Do not eat or drink after midnight prior to your procedure.   4.   On the morning of your procedure do NOT take any medication.  5.  The night before your procedure and the morning of your procedure scrub your neck/chest with surgical scrub.  An instruction letter is included with  this letter.    5.  Plan for an overnight stay.  If you use your phone frequently bring your phone charger.  When you are discharged you will need someone to drive you home.   6.  You will follow up with the [facility] 10-14 days after your procedure. You will follow up with Dr. Curt Bears 91 days after your procedure.  These appointments will be made for you.   * If you have ANY questions after you get home, please call the office [phone] and ask for Elyce Zollinger RN or send a MyChart message.    Supplemental Discharge Instructions for  Pacemaker/Defibrillator Patients  ACTIVITY No heavy lifting or vigorous activity with your left/right arm for 6 to 8 weeks.  Do not raise your left/right arm above your head for one  week.  Gradually raise your affected arm as drawn below.           __  NO DRIVING for     ; you may begin driving on     .  WOUND CARE - Keep the wound area clean and dry.  Do not get this area wet for one week. No showers for one week; you may shower on     . - The tape/steri-strips on your wound will fall off; do not pull them off.  No bandage is needed on the site.  DO  NOT apply any creams, oils, or ointments to the wound area. - If you notice any drainage or discharge from the wound, any swelling or bruising at the site, or you develop a fever > 101? F after you are discharged home, call the office at once.  SPECIAL INSTRUCTIONS - You are still able to use cellular telephones; use the ear opposite the side where you have your pacemaker/defibrillator.  Avoid carrying your cellular phone near your device. - When traveling through airports, show security personnel your identification card to avoid being screened in the metal detectors.  Ask the security personnel to use the hand wand. - Avoid arc welding equipment, MRI testing (magnetic resonance imaging), TENS units (transcutaneous nerve stimulators).  Call the office for questions about other devices. - Avoid electrical appliances that are in poor condition or are not properly grounded. - Microwave ovens are safe to be near or to operate.  ADDITIONAL INFORMATION FOR DEFIBRILLATOR PATIENTS SHOULD YOUR DEVICE GO OFF: - If your device goes off ONCE and you feel fine afterward, notify the device clinic nurses. - If your device goes off ONCE and you do not feel well afterward, call 911. - If your device goes off TWICE, call 911. - If your device goes off [place], call 911.  DO NOT DRIVE YOURSELF OR A FAMILY MEMBER WITH A DEFIBRILLATOR TO THE HOSPITAL-CALL 911.

## 2019-06-30 NOTE — Progress Notes (Signed)
Electrophysiology Office Note   Date:  06/30/2019   ID:  Krista, Mason 09-03-1936, MRN [id number]  PCP:  Krista Ruff, MD  Cardiologist:  Krista Mason Primary Electrophysiologist:  Krista [name], MD    No chief complaint on file.    History of Present Illness: Krista Mason is a 83 y.o. female who presents today for electrophysiology evaluation.   Hx of HTN, PVCs, HLD, DM2, CKD, histoplasmosis, and tobacco abuse. Recently diagnosed with CHF. Myoview without ischemia. Holter with 29% PVCs. She has been having episodes of fatigue and shortness of breath.  That being said, her SOB has improved since starting her lasix. She is sleeping better but does still complain of palpitations. She previously had to sleep in a chair but was able to sleep on one pillow recently. Had PVC ablation 11/26/16 with ablation inferior the RCC. Ablation was complicated by LBBB.  Today, denies symptoms of palpitations, chest pain,  orthopnea, PND, lower extremity edema, claudication, dizziness, presyncope, syncope, bleeding, or neurologic sequela. The patient is tolerating medications without difficulties.  Her main complaint is of fatigue and shortness of breath.  She is able to do most of her daily activities, but does get significant shortness of breath.  She has been told that it is due to her heart failure.  She also has chronic renal failure which her nephrologist feels is due to cardiorenal.   Past Medical History:  Diagnosis Date  . Atrial fibrillation ([place])   . Chronic combined systolic and diastolic CHF (congestive heart failure) ([place])    a. LV dysfcuntion felt to be related to PVCs  . Chronic kidney disease   . Diabetes mellitus without complication ([place])   . Hx of echocardiogram    Echo (1/16):  EF 60-65%, no RWMA, Gr 1 DD, mod AI, mod MR, mild LAE  . Hyperkalemia   . Hyperlipidemia   . Hypertension   . Orthostatic hypotension   . PVC (premature ventricular contraction)    a. s/p PVC  ablation in 11/2016  . Rheumatic fever    Past Surgical History:  Procedure Laterality Date  . ESOPHAGOGASTRODUODENOSCOPY (EGD) WITH PROPOFOL N/A 09/25/2018   Procedure: ESOPHAGOGASTRODUODENOSCOPY (EGD) WITH PROPOFOL;  Surgeon: Krista Lobo, MD;  Location: [facility];  Service: Endoscopy;  Laterality: N/A;  . ESOPHAGOGASTRODUODENOSCOPY (EGD) WITH PROPOFOL N/A 10/29/2018   Procedure: ESOPHAGOGASTRODUODENOSCOPY (EGD) WITH PROPOFOL;  Surgeon: Krista Lobo, MD;  Location: [facility];  Service: Endoscopy;  Laterality: N/A;  . KNEE SURGERY Left 2009  . OVARIAN CYST REMOVAL    . PVC ABLATION N/A 11/30/2016   Procedure: PVC Ablation;  Surgeon: Krista [name], MD;  Location: [place] [facility];  Service: Cardiovascular;  Laterality: N/A;  . RIGHT HEART CATH N/A 01/31/2018   Procedure: RIGHT HEART CATH;  Surgeon: Krista Artist, MD;  Location: [place] [facility];  Service: Cardiovascular;  Laterality: N/A;  . RIGHT/LEFT HEART CATH AND CORONARY ANGIOGRAPHY N/A 01/25/2017   Procedure: Right/Left Heart Cath and Coronary Angiography;  Surgeon: Krista Artist, MD;  Location: [place] [facility];  Service: Cardiovascular;  Laterality: N/A;  . SAVORY DILATION N/A 09/25/2018   Procedure: SAVORY DILATION;  Surgeon: Krista Lobo, MD;  Location: [facility];  Service: Endoscopy;  Laterality: N/A;  . SAVORY DILATION N/A 10/29/2018   Procedure: SAVORY DILATION;  Surgeon: Krista Lobo, MD;  Location: [facility];  Service: Endoscopy;  Laterality: N/A;     Current Outpatient Medications  Medication Sig Dispense Refill  . acetaminophen (  TYLENOL) 500 MG tablet Take 500 mg by mouth daily as needed for moderate pain.    [name] aspirin 81 MG tablet Take 1 tablet (81 mg total) by mouth daily. 30 tablet 11  . atorvastatin (LIPITOR) 20 MG tablet TAKE 1 TABLET BY MOUTH EVERY DAY 90 tablet 3  . carvedilol (COREG) 6.25 MG tablet TAKE 1.5 TABLETS (9.375 MG TOTAL) BY MOUTH 2 (TWO) TIMES DAILY 270 tablet 3  .  Cholecalciferol (VITAMIN D) 50 MCG (2000 UT) tablet Take 2,000 Units by mouth daily.    . hydrALAZINE (APRESOLINE) 50 MG tablet Take 1 tablet (50 mg total) by mouth 2 (two) times daily. 180 tablet 3  . omeprazole (PRILOSEC) 40 MG capsule Take 40 mg by mouth 2 (two) times daily.     Krista Mason Glycol-Propyl Glycol (SYSTANE OP) Place 1 drop into both eyes 2 (two) times daily as needed (dryness).     No current facility-administered medications for this visit.     Allergies:   Penicillins, Ceclor [cefaclor], Crestor [rosuvastatin calcium], Epinephrine, Influenza vaccines, Nsaids, Percocet [oxycodone-acetaminophen], and Prednisone   Social History:  The patient  reports that she quit smoking about 2 years ago. She has a 45.00 pack-year smoking history. She has never used smokeless tobacco. She reports that she does not drink alcohol or use drugs.   Family History:  The patient's family history includes Cancer in her father; Diabetes in her son; Heart attack in her father; Heart disease in her father; Hyperlipidemia in her son; Hypertension in her brother, brother, father, and son; Stroke in her brother.    ROS:  Please see the history of present illness.   Otherwise, review of systems is positive for none.   All other systems are reviewed and negative.   PHYSICAL EXAM: VS:  BP 140/80   Pulse 84   Ht 5' 4.5" (1.638 m)   Wt 154 lb 3.2 oz (69.9 kg)   SpO2 98%   BMI 26.06 kg/m  , BMI Body mass index is 26.06 kg/m. GEN: Well nourished, well developed, in no acute distress  HEENT: normal  Neck: no JVD, carotid bruits, or masses Cardiac: RRR; no murmurs, rubs, or gallops,no edema  Respiratory:  clear to auscultation bilaterally, normal work of breathing GI: soft, nontender, nondistended, + BS MS: no deformity or atrophy  Skin: warm and dry Neuro:  Strength and sensation are intact Psych: euthymic mood, full affect  EKG:  EKG is not ordered today. Personal review of the ekg ordered 06/11/19  shows SR. LBBB  Recent Labs: 06/11/2019: ALT 16; B Natriuretic Peptide 131.8; Hemoglobin 12.4; Platelets 238 06/18/2019: BUN 33; Creatinine, Ser 2.31; Potassium 4.4; Sodium 137    Lipid Panel  No results found for: CHOL, TRIG, HDL, CHOLHDL, VLDL, LDLCALC, LDLDIRECT   Wt Readings from Last 3 Encounters:  06/30/19 154 lb 3.2 oz (69.9 kg)  06/11/19 153 lb 14.4 oz (69.8 kg)  01/01/19 153 lb (69.4 kg)      Other studies Reviewed: Additional studies/ records that were reviewed today include: TTE 01/01/19  Review of the above records today demonstrates:   1. The left ventricle has a visually estimated ejection fraction of 35%. The cavity size was normal. There is mildly increased left ventricular wall thickness. Left ventricular diastolic Doppler parameters are consistent with impaired relaxation.  Diffuse hypokinesis with septal-lateral dyssynchrony.  2. The right ventricle has normal systolic function. The cavity was normal. There is no increase in right ventricular wall thickness.  3. Left atrial  size was mildly dilated.  4. Trivial pericardial effusion is present.  5. Mild calcification of the mitral valve leaflet. Mitral valve regurgitation is mild to moderate by color flow Doppler. No evidence of mitral valve stenosis.  6. The aortic valve is tricuspid Mild calcification of the aortic valve. Aortic valve regurgitation is mild by color flow Doppler. no stenosis of the aortic valve.  7. The aortic root and ascending aorta are normal in size and structure.  8. Normal IVC size with PA systolic pressure 21 mmHg.   Holter 01/31/17 - Personally reviewed Frequent PACs and PVC with periods of wandering atrial pacemaker and several brief runs SVT.   PVC burden down to 2.1%  Cath 01/25/17 1. Heavily calcified coronaries with non-obstructive CAD 2. EF 45-50%  3, Normal hemodynamics 4. Significantly decreased PVC burden in cath lab from previous  ASSESSMENT AND PLAN:  1.  PVCs: PVC ablation  11/26/1997.  Holter monitors agrees burden at 2.1% post ablation.    2. Nonischemic cardiomyopathy: Unfortunately back down to 35 to 40%.  She is symptomatic with quite a bit of shortness of breath.  Is certainly reasonable to consider CRT P implant for heart failure symptoms.  Risks and benefits were discussed and include bleeding, infection, tamponade, pneumothorax.  Patient understands these risks and is agreed to the procedure.  3. Hyperlipidemia: Continue Lipitor    Current medicines are reviewed at length with the patient today.   The patient does not have concerns regarding her medicines.  The following changes were made today:  none  Labs/ tests ordered today include:  No orders of the defined types were placed in this encounter.    Disposition:   FU with Krista Camnitz 3 months  Signed, Krista [name], MD  06/30/2019 4:12 PM     [address] [place] [id number] [phone] (office) [phone] (fax)

## 2019-07-01 LAB — CBC
Hematocrit: 33.7 % — ABNORMAL LOW (ref 34.0–46.6)
Hemoglobin: 11.6 g/dL (ref 11.1–15.9)
MCH: 30.4 pg (ref 26.6–33.0)
MCHC: 34.4 g/dL (ref 31.5–35.7)
MCV: 89 fL (ref 79–97)
Platelets: 219 10*3/uL (ref 150–450)
RBC: 3.81 x10E6/uL (ref 3.77–5.28)
RDW: 13.2 % (ref 11.7–15.4)
WBC: 7.1 10*3/uL (ref 3.4–10.8)

## 2019-07-01 LAB — BASIC METABOLIC PANEL
BUN/Creatinine Ratio: 15 (ref 12–28)
BUN: 32 mg/dL — ABNORMAL HIGH (ref 8–27)
CO2: 22 mmol/L (ref 20–29)
Calcium: 10 mg/dL (ref 8.7–10.3)
Chloride: 101 mmol/L (ref 96–106)
Creatinine, Ser: 2.13 mg/dL — ABNORMAL HIGH (ref 0.57–1.00)
GFR calc Af Amer: 24 mL/min/{1.73_m2} — ABNORMAL LOW (ref >59)
GFR calc non Af Amer: 21 mL/min/{1.73_m2} — ABNORMAL LOW (ref >59)
Glucose: 114 mg/dL — ABNORMAL HIGH (ref 65–99)
Potassium: 4 mmol/L (ref 3.5–5.2)
Sodium: 139 mmol/L (ref 134–144)

## 2019-07-02 ENCOUNTER — Inpatient Hospital Stay (HOSPITAL_COMMUNITY): Admission: RE | Admit: 2019-07-02 | Payer: Medicare HMO | Source: Ambulatory Visit

## 2019-07-16 ENCOUNTER — Telehealth: Payer: Self-pay | Admitting: Cardiology

## 2019-07-16 NOTE — Telephone Encounter (Signed)
° ° °  Patient has pre procedure questions (10/2)

## 2019-07-16 NOTE — Telephone Encounter (Signed)
Pt asking rationale behind needing this procedure.  We discussed this and she better understands. She asked how [place] the device might last.  Informed that I would discuss this with Dr. Curt Bears and let her know next week. She is agreeable to this plan.

## 2019-07-21 ENCOUNTER — Other Ambulatory Visit (HOSPITAL_COMMUNITY)
Admission: RE | Admit: 2019-07-21 | Discharge: 2019-07-21 | Disposition: A | Discharge status: Home / Self Care | Payer: Medicare HMO | Source: Ambulatory Visit | Attending: Cardiology | Admitting: Cardiology

## 2019-07-21 DIAGNOSIS — Z01812 Encounter for preprocedural laboratory examination: Secondary | ICD-10-CM | POA: Insufficient documentation

## 2019-07-21 DIAGNOSIS — Z20828 Contact with and (suspected) exposure to other viral communicable diseases: Secondary | ICD-10-CM | POA: Diagnosis not present

## 2019-07-22 LAB — NOVEL CORONAVIRUS, NAA (HOSP ORDER, SEND-OUT TO REF LAB; TAT 18-24 HRS): SARS-CoV-2, NAA: NOT DETECTED

## 2019-07-23 NOTE — Telephone Encounter (Signed)
Late entry: Called pt yesterday and informed her that it would be a 1 night stay in hospital and avg device battery life will be around 7 1/2 years.

## 2019-07-24 ENCOUNTER — Other Ambulatory Visit: Payer: Self-pay

## 2019-07-24 ENCOUNTER — Ambulatory Visit (HOSPITAL_COMMUNITY)
Admission: RE | Admit: 2019-07-24 | Discharge: 2019-07-25 | Disposition: A | Discharge status: Home / Self Care | Payer: Medicare HMO | Attending: Cardiology | Admitting: Cardiology

## 2019-07-24 ENCOUNTER — Encounter (HOSPITAL_COMMUNITY): Payer: Self-pay | Admitting: Emergency Medicine

## 2019-07-24 ENCOUNTER — Ambulatory Visit (HOSPITAL_COMMUNITY)
Admission: RE | Disposition: A | Discharge status: Home / Self Care | Payer: Medicare HMO | Source: Home / Self Care | Attending: Cardiology

## 2019-07-24 DIAGNOSIS — Z950 Presence of cardiac pacemaker: Secondary | ICD-10-CM | POA: Insufficient documentation

## 2019-07-24 DIAGNOSIS — N179 Acute kidney failure, unspecified: Secondary | ICD-10-CM | POA: Diagnosis present

## 2019-07-24 DIAGNOSIS — N183 Chronic kidney disease, stage 3 unspecified: Secondary | ICD-10-CM | POA: Diagnosis not present

## 2019-07-24 DIAGNOSIS — E1122 Type 2 diabetes mellitus with diabetic chronic kidney disease: Secondary | ICD-10-CM | POA: Diagnosis not present

## 2019-07-24 DIAGNOSIS — J449 Chronic obstructive pulmonary disease, unspecified: Secondary | ICD-10-CM | POA: Insufficient documentation

## 2019-07-24 DIAGNOSIS — I493 Ventricular premature depolarization: Secondary | ICD-10-CM | POA: Diagnosis present

## 2019-07-24 DIAGNOSIS — I5022 Chronic systolic (congestive) heart failure: Secondary | ICD-10-CM

## 2019-07-24 DIAGNOSIS — Z880 Allergy status to penicillin: Secondary | ICD-10-CM | POA: Diagnosis not present

## 2019-07-24 DIAGNOSIS — E782 Mixed hyperlipidemia: Secondary | ICD-10-CM | POA: Diagnosis not present

## 2019-07-24 DIAGNOSIS — Z881 Allergy status to other antibiotic agents status: Secondary | ICD-10-CM | POA: Diagnosis not present

## 2019-07-24 DIAGNOSIS — Z885 Allergy status to narcotic agent status: Secondary | ICD-10-CM | POA: Diagnosis not present

## 2019-07-24 DIAGNOSIS — I447 Left bundle-branch block, unspecified: Secondary | ICD-10-CM | POA: Insufficient documentation

## 2019-07-24 DIAGNOSIS — Z886 Allergy status to analgesic agent status: Secondary | ICD-10-CM | POA: Insufficient documentation

## 2019-07-24 DIAGNOSIS — E1169 Type 2 diabetes mellitus with other specified complication: Secondary | ICD-10-CM | POA: Diagnosis present

## 2019-07-24 DIAGNOSIS — E118 Type 2 diabetes mellitus with unspecified complications: Secondary | ICD-10-CM | POA: Diagnosis present

## 2019-07-24 DIAGNOSIS — I130 Hypertensive heart and chronic kidney disease with heart failure and stage 1 through stage 4 chronic kidney disease, or unspecified chronic kidney disease: Secondary | ICD-10-CM | POA: Diagnosis not present

## 2019-07-24 DIAGNOSIS — I428 Other cardiomyopathies: Secondary | ICD-10-CM | POA: Insufficient documentation

## 2019-07-24 DIAGNOSIS — Z79899 Other long term (current) drug therapy: Secondary | ICD-10-CM | POA: Diagnosis not present

## 2019-07-24 DIAGNOSIS — Z7982 Long term (current) use of aspirin: Secondary | ICD-10-CM | POA: Insufficient documentation

## 2019-07-24 DIAGNOSIS — N184 Chronic kidney disease, stage 4 (severe): Secondary | ICD-10-CM | POA: Diagnosis present

## 2019-07-24 DIAGNOSIS — Z888 Allergy status to other drugs, medicaments and biological substances status: Secondary | ICD-10-CM | POA: Insufficient documentation

## 2019-07-24 DIAGNOSIS — Z95818 Presence of other cardiac implants and grafts: Secondary | ICD-10-CM

## 2019-07-24 DIAGNOSIS — I10 Essential (primary) hypertension: Secondary | ICD-10-CM | POA: Diagnosis present

## 2019-07-24 HISTORY — DX: Gastro-esophageal reflux disease without esophagitis: K21.9

## 2019-07-24 HISTORY — DX: Other cardiomyopathies: I42.8

## 2019-07-24 HISTORY — PX: BIV PACEMAKER INSERTION CRT-P: EP1199

## 2019-07-24 LAB — SURGICAL PCR SCREEN
MRSA, PCR: NEGATIVE
Staphylococcus aureus: NEGATIVE

## 2019-07-24 SURGERY — BIV PACEMAKER INSERTION CRT-P

## 2019-07-24 MED ORDER — CHLORHEXIDINE GLUCONATE 4 % EX LIQD
60.0000 mL | Freq: Once | CUTANEOUS | Status: DC
  Filled 2019-07-24: qty 60

## 2019-07-24 MED ORDER — CARVEDILOL 6.25 MG PO TABS
9.3750 mg | ORAL_TABLET | Freq: Two times a day (BID) | ORAL | Status: DC
  Administered 2019-07-24 – 2019-07-25 (×2): 9.375 mg via ORAL
  Filled 2019-07-24 (×2): qty 1

## 2019-07-24 MED ORDER — ATORVASTATIN CALCIUM 10 MG PO TABS
20.0000 mg | ORAL_TABLET | Freq: Every day | ORAL | Status: DC
  Administered 2019-07-24: 20 mg via ORAL
  Filled 2019-07-24: qty 2

## 2019-07-24 MED ORDER — POLYETHYL GLYCOL-PROPYL GLYCOL 0.4-0.3 % OP GEL: Freq: Two times a day (BID) | OPHTHALMIC | Status: DC | PRN

## 2019-07-24 MED ORDER — PANTOPRAZOLE SODIUM 40 MG PO TBEC
40.0000 mg | DELAYED_RELEASE_TABLET | Freq: Every day | ORAL | Status: DC
  Administered 2019-07-24 – 2019-07-25 (×2): 40 mg via ORAL
  Filled 2019-07-24 (×2): qty 1

## 2019-07-24 MED ORDER — VANCOMYCIN HCL IN DEXTROSE 1-5 GM/200ML-% IV SOLN
1000.0000 mg | Freq: Two times a day (BID) | INTRAVENOUS | Status: AC
  Administered 2019-07-25: 1000 mg via INTRAVENOUS
  Filled 2019-07-24: qty 200

## 2019-07-24 MED ORDER — MIDAZOLAM HCL 5 MG/5ML IJ SOLN
INTRAMUSCULAR | Status: AC
  Filled 2019-07-24: qty 5

## 2019-07-24 MED ORDER — HEPARIN (PORCINE) IN NACL 2-0.9 UNITS/ML
INTRAMUSCULAR | Status: AC | PRN
  Administered 2019-07-24: 500 mL

## 2019-07-24 MED ORDER — HYDRALAZINE HCL 50 MG PO TABS
50.0000 mg | ORAL_TABLET | Freq: Two times a day (BID) | ORAL | Status: DC
  Administered 2019-07-24 – 2019-07-25 (×2): 50 mg via ORAL
  Filled 2019-07-24 (×2): qty 1

## 2019-07-24 MED ORDER — MUPIROCIN 2 % EX OINT
TOPICAL_OINTMENT | CUTANEOUS | Status: AC
  Filled 2019-07-24: qty 22

## 2019-07-24 MED ORDER — ONDANSETRON HCL 4 MG/2ML IJ SOLN
4.0000 mg | Freq: Four times a day (QID) | INTRAMUSCULAR | Status: DC | PRN
  Administered 2019-07-24: 4 mg via INTRAVENOUS
  Filled 2019-07-24: qty 2

## 2019-07-24 MED ORDER — ACETAMINOPHEN 500 MG PO TABS: 500.0000 mg | ORAL_TABLET | Freq: Every day | ORAL | Status: DC | PRN

## 2019-07-24 MED ORDER — POLYVINYL ALCOHOL 1.4 % OP SOLN
1.0000 [drp] | Freq: Two times a day (BID) | OPHTHALMIC | Status: DC | PRN
  Filled 2019-07-24: qty 15

## 2019-07-24 MED ORDER — MUPIROCIN 2 % EX OINT
1.0000 "application " | TOPICAL_OINTMENT | Freq: Once | CUTANEOUS | Status: AC
  Administered 2019-07-24: 1 via TOPICAL

## 2019-07-24 MED ORDER — MIDAZOLAM HCL 5 MG/5ML IJ SOLN
INTRAMUSCULAR | Status: DC | PRN
  Administered 2019-07-24 (×7): 1 mg via INTRAVENOUS

## 2019-07-24 MED ORDER — FENTANYL CITRATE (PF) 100 MCG/2ML IJ SOLN
INTRAMUSCULAR | Status: AC
  Filled 2019-07-24: qty 2

## 2019-07-24 MED ORDER — HEPARIN (PORCINE) IN NACL 1000-0.9 UT/500ML-% IV SOLN
INTRAVENOUS | Status: AC
  Filled 2019-07-24: qty 500

## 2019-07-24 MED ORDER — SODIUM CHLORIDE 0.9 % IV SOLN
INTRAVENOUS | Status: DC
  Administered 2019-07-24: 12:00:00 via INTRAVENOUS

## 2019-07-24 MED ORDER — FUROSEMIDE 20 MG PO TABS
20.0000 mg | ORAL_TABLET | Freq: Every day | ORAL | Status: DC
  Administered 2019-07-24 – 2019-07-25 (×2): 20 mg via ORAL
  Filled 2019-07-24 (×2): qty 1

## 2019-07-24 MED ORDER — IOHEXOL 350 MG/ML SOLN
INTRAVENOUS | Status: DC | PRN
  Administered 2019-07-24: 10 mL

## 2019-07-24 MED ORDER — VITAMIN D3 25 MCG (1000 UNIT) PO TABS
2000.0000 [IU] | ORAL_TABLET | Freq: Every day | ORAL | Status: DC
  Administered 2019-07-24 – 2019-07-25 (×2): 2000 [IU] via ORAL
  Filled 2019-07-24 (×4): qty 2

## 2019-07-24 MED ORDER — VANCOMYCIN HCL IN DEXTROSE 1-5 GM/200ML-% IV SOLN
1000.0000 mg | INTRAVENOUS | Status: AC
  Administered 2019-07-24: 1000 mg via INTRAVENOUS
  Filled 2019-07-24: qty 200

## 2019-07-24 MED ORDER — VANCOMYCIN HCL IN DEXTROSE 1-5 GM/200ML-% IV SOLN
INTRAVENOUS | Status: AC
  Filled 2019-07-24: qty 200

## 2019-07-24 MED ORDER — FENTANYL CITRATE (PF) 100 MCG/2ML IJ SOLN
INTRAMUSCULAR | Status: DC | PRN
  Administered 2019-07-24 (×4): 25 ug via INTRAVENOUS
  Administered 2019-07-24: 12.5 ug via INTRAVENOUS
  Administered 2019-07-24: 25 ug via INTRAVENOUS

## 2019-07-24 MED ORDER — LIDOCAINE HCL 1 % IJ SOLN
INTRAMUSCULAR | Status: AC
  Filled 2019-07-24: qty 60

## 2019-07-24 MED ORDER — SODIUM CHLORIDE 0.9 % IV SOLN
INTRAVENOUS | Status: AC
  Filled 2019-07-24: qty 2

## 2019-07-24 MED ORDER — SODIUM CHLORIDE 0.9 % IV SOLN
80.0000 mg | INTRAVENOUS | Status: AC
  Administered 2019-07-24: 80 mg
  Filled 2019-07-24: qty 2

## 2019-07-24 MED ORDER — ASPIRIN 81 MG PO CHEW
81.0000 mg | CHEWABLE_TABLET | Freq: Every day | ORAL | Status: DC
  Administered 2019-07-24 – 2019-07-25 (×2): 81 mg via ORAL
  Filled 2019-07-24 (×2): qty 1

## 2019-07-24 MED ORDER — LIDOCAINE HCL (PF) 1 % IJ SOLN
INTRAMUSCULAR | Status: DC | PRN
  Administered 2019-07-24: 60 mL via SUBCUTANEOUS

## 2019-07-24 SURGICAL SUPPLY — 15 items
BALLN ATTAIN 80 (BALLOONS) ×2
BALLOON ATTAIN 80 (BALLOONS) IMPLANT
CABLE SURGICAL S-101-97-12 (CABLE) ×2 IMPLANT
CATH ATTAIN COM SURV 6250V-MB2 (CATHETERS) ×1 IMPLANT
DEVICE CRTP PERCEPTA QUAD MRI (Pacemaker) ×1 IMPLANT
LEAD ATTAIN PERFORMA S 4598-88 (Lead) ×1 IMPLANT
LEAD CAPSURE NOVUS 5076-52CM (Lead) ×1 IMPLANT
LEAD CAPSURE NOVUS 5076-58CM (Lead) ×1 IMPLANT
PAD PRO RADIOLUCENT 2001M-C (PAD) ×2 IMPLANT
SHEATH 7FR PRELUDE SNAP 13 (SHEATH) ×2 IMPLANT
SHEATH 9.5FR PRELUDE SNAP 13 (SHEATH) ×1 IMPLANT
SLITTER 6232ADJ (MISCELLANEOUS) ×1 IMPLANT
TRAY PACEMAKER INSERTION (PACKS) ×2 IMPLANT
WIRE ACUITY WHISPER EDS 4648 (WIRE) ×1 IMPLANT
WIRE HI TORQ VERSACORE-J 145CM (WIRE) ×1 IMPLANT

## 2019-07-24 NOTE — Plan of Care (Signed)
  Problem: Education: Goal: Knowledge of General Education information will improve Description: Including pain rating scale, medication(s)/side effects and non-pharmacologic comfort measures Outcome: Progressing   Problem: Health Behavior/Discharge Planning: Goal: Ability to manage health-related needs will improve Outcome: Progressing   Problem: Clinical Measurements: Goal: Ability to maintain clinical measurements within normal limits will improve Outcome: Progressing Goal: Will remain free from infection Outcome: Progressing Goal: Diagnostic test results will improve Outcome: Progressing Goal: Respiratory complications will improve Outcome: Progressing Goal: Cardiovascular complication will be avoided Outcome: Progressing   Problem: Activity: Goal: Risk for activity intolerance will decrease Outcome: Progressing   Problem: Nutrition: Goal: Adequate nutrition will be maintained Outcome: Progressing   Problem: Coping: Goal: Level of anxiety will decrease Outcome: Progressing   Problem: Elimination: Goal: Will not experience complications related to bowel motility Outcome: Progressing   Problem: Education: Goal: Knowledge of cardiac device and self-care will improve Outcome: Progressing   Problem: Cardiac: Goal: Ability to achieve and maintain adequate cardiopulmonary perfusion will improve Outcome: Progressing

## 2019-07-24 NOTE — Discharge Instructions (Signed)
° ° °  Supplemental Discharge Instructions for  Pacemaker/Defibrillator Patients  Activity No heavy lifting or vigorous activity with your left/right arm for 6 to 8 weeks.  Do not raise your left/right arm above your head for one week.  Gradually raise your affected arm as drawn below.             07/28/2019                 07/29/2019                07/30/2019               07/31/2019 __  NO DRIVING until cleared to at your wound check visit .  WOUND CARE - Keep the wound area clean and dry.  Do not get this area wet, no showers until cleared to at your wound check visit. - The tape/steri-strips on your wound will fall off; do not pull them off.  No bandage is needed on the site.  DO  NOT apply any creams, oils, or ointments to the wound area. - If you notice any drainage or discharge from the wound, any swelling or bruising at the site, or you develop a fever > 101? F after you are discharged home, call the office at once.  Special Instructions - You are still able to use cellular telephones; use the ear opposite the side where you have your pacemaker/defibrillator.  Avoid carrying your cellular phone near your device. - When traveling through airports, show security personnel your identification card to avoid being screened in the metal detectors.  Ask the security personnel to use the hand wand. - Avoid arc welding equipment, MRI testing (magnetic resonance imaging), TENS units (transcutaneous nerve stimulators).  Call the office for questions about other devices. - Avoid electrical appliances that are in poor condition or are not properly grounded. - Microwave ovens are safe to be near or to operate.

## 2019-07-24 NOTE — H&P (Signed)
Krista Mason has presented today for surgery, with the diagnosis of CHF.  The various methods of treatment have been discussed with the patient and family. After consideration of risks, benefits and other options for treatment, the patient has consented to  Procedure(s): Pacemaker implant as a surgical intervention .  Risks include but not limited to bleeding, tamponade, infection, pneumothorax, among others. The patient's history has been reviewed, patient examined, no change in status, stable for surgery.  I have reviewed the patient's chart and labs.  Questions were answered to the patient's satisfaction.    Krista Garr Curt Bears, MD 07/24/2019 11:06 AM

## 2019-07-24 NOTE — Progress Notes (Signed)
Orthopedic Tech Progress Note Patient Details:  Krista Mason April 19, 1936 [id number] RN said patient has arm sling Patient ID: Krista Mason, female   DOB: 09/12/1936, 83 y.o.   MRN: [id number]   [name] 07/24/2019, 4:48 PM

## 2019-07-25 ENCOUNTER — Encounter (HOSPITAL_COMMUNITY): Payer: Self-pay | Admitting: Nurse Practitioner

## 2019-07-25 ENCOUNTER — Ambulatory Visit (HOSPITAL_COMMUNITY): Payer: Medicare HMO

## 2019-07-25 DIAGNOSIS — I447 Left bundle-branch block, unspecified: Secondary | ICD-10-CM | POA: Diagnosis not present

## 2019-07-25 DIAGNOSIS — Z79899 Other long term (current) drug therapy: Secondary | ICD-10-CM | POA: Diagnosis not present

## 2019-07-25 DIAGNOSIS — I428 Other cardiomyopathies: Secondary | ICD-10-CM

## 2019-07-25 DIAGNOSIS — E1122 Type 2 diabetes mellitus with diabetic chronic kidney disease: Secondary | ICD-10-CM | POA: Diagnosis not present

## 2019-07-25 DIAGNOSIS — I5022 Chronic systolic (congestive) heart failure: Secondary | ICD-10-CM | POA: Diagnosis not present

## 2019-07-25 DIAGNOSIS — Z7982 Long term (current) use of aspirin: Secondary | ICD-10-CM | POA: Diagnosis not present

## 2019-07-25 DIAGNOSIS — J449 Chronic obstructive pulmonary disease, unspecified: Secondary | ICD-10-CM | POA: Diagnosis not present

## 2019-07-25 DIAGNOSIS — N183 Chronic kidney disease, stage 3 unspecified: Secondary | ICD-10-CM | POA: Diagnosis not present

## 2019-07-25 DIAGNOSIS — I130 Hypertensive heart and chronic kidney disease with heart failure and stage 1 through stage 4 chronic kidney disease, or unspecified chronic kidney disease: Secondary | ICD-10-CM | POA: Diagnosis not present

## 2019-07-25 DIAGNOSIS — Z950 Presence of cardiac pacemaker: Secondary | ICD-10-CM | POA: Diagnosis not present

## 2019-07-25 DIAGNOSIS — E782 Mixed hyperlipidemia: Secondary | ICD-10-CM | POA: Diagnosis not present

## 2019-07-25 MED ORDER — SODIUM CHLORIDE 0.9 % IV SOLN
INTRAVENOUS | Status: DC | PRN
  Administered 2019-07-25: 250 mL via INTRAVENOUS

## 2019-07-25 NOTE — Progress Notes (Signed)
Patient states that she feels a tingling in left breast area radiating upward intermittently. CCMD alert to sending activity over for review of inconsistent pacing. Patient is alert and oriented otherwise waiting for [employer] for integration post pacer placement.

## 2019-07-25 NOTE — Discharge Summary (Addendum)
Discharge Summary    Patient ID: Krista Mason,  MRN: [id number], DOB/AGE: 03/05/1936 83 y.o.  Admit date: 07/24/2019 Discharge date: 07/25/2019  Primary Care Provider: Leighton Mason Primary Cardiologist: Krista Bickers, MD  Primary Electrophysiologist: Krista Lai, MD  Discharge Diagnoses    Principal Problem:   Nonischemic cardiomyopathy [facility])  **s/p MDT CRT-P this admission.  Active Problems:   Uncontrolled hypertension   CKD (chronic kidney disease), stage III (HCC)   Multifocal PVCs   Diabetes mellitus with complication (HCC)   Mixed hyperlipidemia   COPD  GOLD 0    Allergies Allergies  Allergen Reactions  . Penicillins Itching and Rash    Amoxicillin ok- IM pen is what gives reaction  Did it involve swelling of the face/tongue/throat, SOB, or low BP? No Did it involve sudden or severe rash/hives, skin peeling, or any reaction on the inside of your mouth or nose? No Did you need to seek medical attention at a hospital or doctor's office? No When did it last happen?10 + year If all above answers are "NO", may proceed with cephalosporin use.   Krista Mason [Cefaclor] Itching  . Crestor [Rosuvastatin Calcium] Nausea Only  . Epinephrine Other (See Comments)    "feels like heart is beating out of chest"  . Influenza Vaccines     Swelling and redness at injection site, fever  . Nsaids     Pt states she is not taking because of kidney function  . Percocet [Oxycodone-Acetaminophen] Nausea And Vomiting  . Prednisone     Dizziness, spiked blood sugar    Diagnostic Studies/Procedures    Biventricular pacemaker placement 10.2.2020  CONCLUSIONS:  1. Nonischemic cardiomyopathy with Left bundle-branch block and chronic New York Heart Association class II heart failure.  2. Successful biventricular pacemaker implantation [Medtronic model [id number] Percepta Quad CRT-P MRI SureScan (serial Number [id number] ) device]  3. No early apparent complications.   _____________   History of Present Illness     83 y/o ? with a h/o NICM, HTN, HL, DMII, moderate MR, CKD III, and COPD.  In the setting of ongoing LV dysfunction with an EF of 35% by echo in March 2020, she was referred to EP for consideration of cardiac resynchronization therapy.  She was seen in April but wished to hold off in the setting of the COVID-19 pandemic.  She followed up with electrophysiology in early September and decision was made to proceed with CRT-P placement.  Hospital Course     Consultants: None  Patient presented to the [facility] electrophysiology laboratory on July 24, 2019 and underwent successful placement of a Medtronic biventricular permanent pacemaker.  She tolerated the procedure well and post procedure course has been uncomplicated.  Chest x-ray this morning shows no evidence of pneumothorax and her site looks good without bleeding or hematoma.  She Krista Mason be discharged home today in good condition with device clinic follow-up in approximately 2 weeks. _____________  Physical Exam   Discharge Vitals Blood pressure (!) 144/60, pulse (!) 58, temperature 98 F (36.7 C), temperature source Oral, resp. rate 16, height 5\' 4"  (1.626 m), weight 69.5 kg, SpO2 95 %.  Filed Weights   07/24/19 1029 07/25/19 0516  Weight: 68.8 kg 69.5 kg    GEN: Well nourished, well developed, in no acute distress.  HEENT: Grossly normal.  Neck: Supple, no JVD, carotid bruits, or masses. Cardiac: RRR, no murmurs, rubs, or gallops. No clubbing, cyanosis, edema.  Radials/DP/PT 2+ and equal bilaterally.  Left  upper chest device placement site without bleeding or hematoma. Respiratory:  Respirations regular and unlabored, clear to auscultation bilaterally. GI: Soft, nontender, nondistended, BS + x 4. MS: no deformity or atrophy. Skin: warm and dry, no rash. Neuro:  Strength and sensation are intact. Psych: AAOx3.  Normal affect.  Labs & Radiologic Studies    Dg Chest 2 View   Result Date: 07/25/2019 CLINICAL DATA:  Cardiac device placement. EXAM: CHEST - 2 VIEW COMPARISON:  01/28/2018 FINDINGS: Interval placement of dual lead left-sided cardiac pacemaker which appears in adequate position. Lungs are adequately inflated without airspace consolidation, effusion or pneumothorax. Calcified left hilar lymph nodes. Stable borderline cardiomegaly. Stable mild compression deformity over the Mason thoracic spine. IMPRESSION: No acute cardiopulmonary disease. Left-sided pacemaker placement.  No pneumothorax. Electronically Signed   By: Krista Mason M.D.   On: 07/25/2019 08:12   Disposition   Pt is being discharged home today in good condition.  Follow-up Plans & Appointments   Follow-up Information    [facility] Follow up.   Specialty: Cardiology Why: 08/06/2019 @ 3:00PM, wound check visit Contact information: [address] [place]       Krista Haw, MD Follow up.   Specialty: Cardiology Why: you Krista Mason be called by Dr. Macky Mason scheduler to make a 3 month follow up visit Contact information: [place] [address] [phone]        Krista Artist, MD Follow up on 09/23/2019.   Specialty: Cardiology Why: 11:20 AM Contact information: [address] [place] [address] [phone]          Discharge Instructions    ([place]) Call MD:  Anytime you have any of the following symptoms: 1) 3 pound weight gain in 24 hours or 5 pounds in 1 week 2) shortness of breath, with or without a dry hacking cough 3) swelling in the hands, feet or stomach 4) if you have to sleep on extra pillows at night in order to breathe.   Complete by: As directed    Call MD for:  difficulty breathing, headache or visual disturbances   Complete by: As directed    Call MD for:  redness, tenderness, or signs of infection (pain,  swelling, redness, odor or green/yellow discharge around incision site)   Complete by: As directed    Call MD for:  severe uncontrolled pain   Complete by: As directed    Call MD for:  temperature >100.4   Complete by: As directed    Diet - low sodium heart healthy   Complete by: As directed    Increase activity slowly   Complete by: As directed       Discharge Medications   Allergies as of 07/25/2019      Reactions   Penicillins Itching, Rash   Amoxicillin ok- IM pen is what gives reaction Did it involve swelling of the face/tongue/throat, SOB, or low BP? No Did it involve sudden or severe rash/hives, skin peeling, or any reaction on the inside of your mouth or nose? No Did you need to seek medical attention at a hospital or doctor's office? No When did it last happen?10 + year If all above answers are "NO", may proceed with cephalosporin use.   Ceclor [cefaclor] Itching   Crestor [rosuvastatin Calcium] Nausea Only   Epinephrine Other (See Comments)   "feels like heart is beating out of chest"  Influenza Vaccines    Swelling and redness at injection site, fever   Nsaids    Pt states she is not taking because of kidney function   Percocet [oxycodone-acetaminophen] Nausea And Vomiting   Prednisone    Dizziness, spiked blood sugar      Medication List    TAKE these medications   acetaminophen 500 MG tablet Commonly known as: TYLENOL Take 500 mg by mouth daily as needed for moderate pain.   aspirin 81 MG tablet Take 1 tablet (81 mg total) by mouth daily.   atorvastatin 20 MG tablet Commonly known as: LIPITOR TAKE 1 TABLET BY MOUTH EVERY DAY What changed: when to take this   carvedilol 6.25 MG tablet Commonly known as: COREG TAKE 1.5 TABLETS (9.375 MG TOTAL) BY MOUTH 2 (TWO) TIMES DAILY   furosemide 20 MG tablet Commonly known as: LASIX Take 20 mg by mouth daily.   hydrALAZINE 50 MG tablet Commonly known as: APRESOLINE Take 1 tablet (50 mg total) by  mouth 2 (two) times daily.   omeprazole 40 MG capsule Commonly known as: PRILOSEC Take 40 mg by mouth 2 (two) times daily.   SYSTANE OP Place 1 drop into both eyes 2 (two) times daily as needed (dryness).   VICKS VAPORUB EX Apply 1 application topically daily as needed (toe fungus).   Vitamin D 50 MCG (2000 UT) tablet Take 2,000 Units by mouth daily.        Outstanding Labs/Studies   None  Duration of Discharge Encounter   Greater than 30 minutes including physician time.  Signed, Murray Hodgkins NP 07/25/2019, 10:46 AM   I have seen and examined this patient with Ignacia Bayley.  Agree with above, note added to reflect my findings.  On exam, RRR, no murmurs, lungs clear.  She is now status post Medtronic CRT-P for chronic systolic heart failure.  She did have some diaphragmatic stim this morning and thus her factors were changed.  Device otherwise functioning appropriately.  Chest x-ray and interrogation without issue.  Plan for discharge today with follow-up in device clinic.  Kamarion Zagami M. Chalise Pe MD 07/25/2019 11:04 AM

## 2019-07-25 NOTE — Plan of Care (Signed)
  Problem: Education: Goal: Ability to demonstrate management of disease process will improve Outcome: Progressing Goal: Ability to verbalize understanding of medication therapies will improve Outcome: Progressing Goal: Individualized Educational Video(s) Outcome: Progressing   Problem: Activity: Goal: Capacity to carry out activities will improve Outcome: Progressing   Problem: Cardiac: Goal: Ability to achieve and maintain adequate cardiopulmonary perfusion will improve Outcome: Progressing   Problem: Education: Goal: Knowledge of General Education information will improve Description: Including pain rating scale, medication(s)/side effects and non-pharmacologic comfort measures Outcome: Progressing   Problem: Health Behavior/Discharge Planning: Goal: Ability to manage health-related needs will improve Outcome: Progressing   Problem: Clinical Measurements: Goal: Ability to maintain clinical measurements within normal limits will improve Outcome: Progressing Goal: Will remain free from infection Outcome: Progressing Goal: Diagnostic test results will improve Outcome: Progressing Goal: Respiratory complications will improve Outcome: Progressing Goal: Cardiovascular complication will be avoided Outcome: Progressing   Problem: Activity: Goal: Risk for activity intolerance will decrease Outcome: Progressing   Problem: Nutrition: Goal: Adequate nutrition will be maintained Outcome: Progressing   Problem: Coping: Goal: Level of anxiety will decrease Outcome: Progressing   Problem: Elimination: Goal: Will not experience complications related to bowel motility Outcome: Progressing Goal: Will not experience complications related to urinary retention Outcome: Progressing   Problem: Pain Managment: Goal: General experience of comfort will improve Outcome: Progressing   Problem: Safety: Goal: Ability to remain free from injury will improve Outcome: Progressing    Problem: Skin Integrity: Goal: Risk for impaired skin integrity will decrease Outcome: Progressing   Problem: Education: Goal: Knowledge of cardiac device and self-care will improve Outcome: Progressing Goal: Ability to safely manage health related needs after discharge will improve Outcome: Progressing Goal: Individualized Educational Video(s) Outcome: Progressing   Problem: Cardiac: Goal: Ability to achieve and maintain adequate cardiopulmonary perfusion will improve Outcome: Progressing   

## 2019-07-27 ENCOUNTER — Encounter (HOSPITAL_COMMUNITY): Payer: Self-pay | Admitting: Cardiology

## 2019-07-27 ENCOUNTER — Telehealth: Payer: Self-pay | Admitting: Cardiology

## 2019-07-27 DIAGNOSIS — I442 Atrioventricular block, complete: Secondary | ICD-10-CM

## 2019-07-27 MED ORDER — CARVEDILOL 12.5 MG PO TABS: 12.5000 mg | ORAL_TABLET | Freq: Two times a day (BID) | ORAL | 3 refills | Status: DC

## 2019-07-27 MED FILL — Lidocaine HCl Local Inj 1%: INTRAMUSCULAR | Qty: 60 | Status: AC

## 2019-07-27 NOTE — Telephone Encounter (Signed)
Spoke with patient and she reports intermittent "ponding" and palpitations of her heart since 10 am this morning.No CP, SOB or syncopy. Remote transmission sent. Patient appears to be having episodes of AT and PACs. No missed doses of carvedilol. No EGMs, just sensed episodes.

## 2019-07-27 NOTE — Telephone Encounter (Signed)
New Message:   Pt had Pacemaker put in on Friday. She says her heart is pounding so hard, feels like it is going yo come out of her chest. Pt says she is also having som e shortness of breath.    Pt c/o Shortness Of Breath: STAT if SOB developed within the last 24 hours or pt is noticeably SOB on the phone  1. Are you currently SOB (can you hear that pt is SOB on the phone)?  A little 2. How Doshi have you been experiencing SOB?  A little worse  3. Are you SOB when sitting or when up moving around? Both  4.  Are you currently experiencing any other symptoms? no

## 2019-07-27 NOTE — Telephone Encounter (Signed)
Chart reviewed by Dr. Curt Bears. Coreg dose incresased to 12.5 mg twice a day, order repeated and read back.   Contacted patient and given instructions and education concerning increasing Coreg to 12.5 mg twice a day. ED instructions given Confirmed pharmacy that new RX is to be sent and order completed.

## 2019-08-05 ENCOUNTER — Inpatient Hospital Stay (HOSPITAL_COMMUNITY): Admission: RE | Admit: 2019-08-05 | Payer: Medicare HMO | Source: Ambulatory Visit

## 2019-08-06 ENCOUNTER — Ambulatory Visit (INDEPENDENT_AMBULATORY_CARE_PROVIDER_SITE_OTHER): Payer: Medicare HMO | Admitting: Student

## 2019-08-06 ENCOUNTER — Other Ambulatory Visit: Payer: Self-pay

## 2019-08-06 DIAGNOSIS — I5022 Chronic systolic (congestive) heart failure: Secondary | ICD-10-CM | POA: Diagnosis not present

## 2019-08-06 NOTE — Progress Notes (Signed)
Wound check appointment. Steri-strips removed. Wound without redness or edema. Incision edges approximated, wound well healed. Normal device function. Thresholds, sensing, and impedances consistent with implant measurements. Device programmed at 3.5V/auto capture programmed on for extra safety margin until 3 month visit. Histogram distribution appropriate for patient and level of activity. Pt having very frequent atrial ectopy, including 2 short (~6 minute and ~25 minutes) of atrial fibrillation. Patient educated about wound care, arm mobility, lifting restrictions. ROV in 3 months with Dr. Curt Bears.    Labwork sent today to further work up atrial ectopy and HF.

## 2019-08-07 ENCOUNTER — Telehealth: Payer: Self-pay | Admitting: Student

## 2019-08-07 DIAGNOSIS — I491 Atrial premature depolarization: Secondary | ICD-10-CM

## 2019-08-07 DIAGNOSIS — I480 Paroxysmal atrial fibrillation: Secondary | ICD-10-CM

## 2019-08-07 LAB — CUP PACEART INCLINIC DEVICE CHECK
Battery Remaining Longevity: 121 mo
Battery Voltage: 3.19 V
Brady Statistic AP VP Percent: 44.08 %
Brady Statistic AP VS Percent: 0.63 %
Brady Statistic AS VP Percent: 44.19 %
Brady Statistic AS VS Percent: 11.09 %
Brady Statistic RA Percent Paced: 44.34 %
Brady Statistic RV Percent Paced: 86.39 %
Date Time Interrogation Session: 20201015200820
Implantable Lead Implant Date: 20201002
Implantable Lead Implant Date: 20201002
Implantable Lead Implant Date: 20201002
Implantable Lead Location: 753858
Implantable Lead Location: 753859
Implantable Lead Location: 753860
Implantable Lead Model: 4598
Implantable Lead Model: 5076
Implantable Lead Model: 5076
Implantable Pulse Generator Implant Date: 20201002
Lead Channel Impedance Value: 285 Ohm
Lead Channel Impedance Value: 399 Ohm
Lead Channel Impedance Value: 418 Ohm
Lead Channel Impedance Value: 437 Ohm
Lead Channel Impedance Value: 456 Ohm
Lead Channel Impedance Value: 456 Ohm
Lead Channel Impedance Value: 456 Ohm
Lead Channel Impedance Value: 494 Ohm
Lead Channel Impedance Value: 494 Ohm
Lead Channel Impedance Value: 646 Ohm
Lead Channel Pacing Threshold Amplitude: 0.625 V
Lead Channel Pacing Threshold Amplitude: 2.375 V
Lead Channel Pacing Threshold Pulse Width: 0.4 ms
Lead Channel Pacing Threshold Pulse Width: 0.4 ms
Lead Channel Sensing Intrinsic Amplitude: 1.625 mV
Lead Channel Sensing Intrinsic Amplitude: 1.75 mV
Lead Channel Sensing Intrinsic Amplitude: 31.625 mV
Lead Channel Sensing Intrinsic Amplitude: 31.625 mV
Lead Channel Setting Pacing Amplitude: 2 V
Lead Channel Setting Pacing Amplitude: 3.5 V
Lead Channel Setting Pacing Amplitude: 3.5 V
Lead Channel Setting Pacing Pulse Width: 0.4 ms
Lead Channel Setting Pacing Pulse Width: 0.4 ms
Lead Channel Setting Sensing Sensitivity: 1.2 mV

## 2019-08-07 LAB — BASIC METABOLIC PANEL
BUN/Creatinine Ratio: 16 (ref 12–28)
BUN: 32 mg/dL — ABNORMAL HIGH (ref 8–27)
CO2: 21 mmol/L (ref 20–29)
Calcium: 10.3 mg/dL (ref 8.7–10.3)
Chloride: 101 mmol/L (ref 96–106)
Creatinine, Ser: 2 mg/dL — ABNORMAL HIGH (ref 0.57–1.00)
GFR calc Af Amer: 26 mL/min/{1.73_m2} — ABNORMAL LOW (ref >59)
GFR calc non Af Amer: 23 mL/min/{1.73_m2} — ABNORMAL LOW (ref >59)
Glucose: 176 mg/dL — ABNORMAL HIGH (ref 65–99)
Potassium: 4.5 mmol/L (ref 3.5–5.2)
Sodium: 139 mmol/L (ref 134–144)

## 2019-08-07 LAB — BRAIN NATRIURETIC PEPTIDE: BNP: 432.7 pg/mL — ABNORMAL HIGH (ref 0.0–100.0)

## 2019-08-07 MED ORDER — AMIODARONE HCL 200 MG PO TABS: 200.0000 mg | ORAL_TABLET | Freq: Every day | ORAL | 3 refills | Status: DC

## 2019-08-07 NOTE — Telephone Encounter (Signed)
Called pt and daughter to touch base after discussing her case with Dr. Curt Bears and Dr. Haroldine Laws.   Pt having frequent, symptomatic atrial ectopy over the past month or so. Through extensive testing yesterday, was felt NOT to be symptomatic V pacing or diaphragmatic stim, due to the symptoms persisting with all pacing inhibited.   Pt does state she is feeling better today with additional lasix.   In addition, pt had ~25 minutes of Afib on 07/28/2019.   Pt agreeable to try amiodarone 200 mg daily for her frequent atrial ectopy.   She has mention of history of atrial fibrillation and MAT in her chart, as well one mention of a cardioversion, which I cannot find records of, and may have been erroneous.  Afib listed in history back to 2015. Available EKGs appear to be MAT.  With Afib noted on recent device check and CHA2DS2-VASc of at least 6 (CHF, HTN, PAD, Age >75 (2), Female). Recommend in person discussion of oral anticoagulation.  Daughter is worried about falls.   Will ask Afib team to see next week and discuss risks/benefits of [place] with patient and her daughter. Pt request that appointment be arranged through her daughter, Davy Pique at [phone], who is her transportation.  Legrand Como [address]" [place], PA-C  08/07/2019 11:14 AM

## 2019-08-08 LAB — CBC WITH DIFFERENTIAL/PLATELET
Basophils Absolute: 0.1 10*3/uL (ref 0.0–0.2)
Basos: 1 %
EOS (ABSOLUTE): 0.3 10*3/uL (ref 0.0–0.4)
Eos: 3 %
Hematocrit: 34.5 % (ref 34.0–46.6)
Hemoglobin: 11.4 g/dL (ref 11.1–15.9)
Immature Grans (Abs): 0.1 10*3/uL (ref 0.0–0.1)
Immature Granulocytes: 1 %
Lymphocytes Absolute: 1.4 10*3/uL (ref 0.7–3.1)
Lymphs: 16 %
MCH: 28.4 pg (ref 26.6–33.0)
MCHC: 33 g/dL (ref 31.5–35.7)
MCV: 86 fL (ref 79–97)
Monocytes Absolute: 0.7 10*3/uL (ref 0.1–0.9)
Monocytes: 9 %
Neutrophils Absolute: 6 10*3/uL (ref 1.4–7.0)
Neutrophils: 70 %
Platelets: 256 10*3/uL (ref 150–450)
RBC: 4.01 x10E6/uL (ref 3.77–5.28)
RDW: 13.2 % (ref 11.7–15.4)
WBC: 8.5 10*3/uL (ref 3.4–10.8)

## 2019-08-08 LAB — MAGNESIUM: Magnesium: 2.4 mg/dL — ABNORMAL HIGH (ref 1.6–2.3)

## 2019-08-10 ENCOUNTER — Telehealth: Payer: Self-pay | Admitting: Cardiology

## 2019-08-10 ENCOUNTER — Ambulatory Visit (HOSPITAL_COMMUNITY)
Admission: RE | Admit: 2019-08-10 | Discharge: 2019-08-10 | Disposition: A | Discharge status: Home / Self Care | Payer: Medicare HMO | Source: Ambulatory Visit | Attending: Physician Assistant | Admitting: Physician Assistant

## 2019-08-10 ENCOUNTER — Other Ambulatory Visit: Payer: Self-pay

## 2019-08-10 ENCOUNTER — Encounter (HOSPITAL_COMMUNITY): Payer: Self-pay | Admitting: Physician Assistant

## 2019-08-10 VITALS — BP 176/80 | HR 86 | Ht 64.0 in | Wt 149.6 lb

## 2019-08-10 DIAGNOSIS — Z887 Allergy status to serum and vaccine status: Secondary | ICD-10-CM | POA: Insufficient documentation

## 2019-08-10 DIAGNOSIS — Z79899 Other long term (current) drug therapy: Secondary | ICD-10-CM | POA: Diagnosis not present

## 2019-08-10 DIAGNOSIS — I5042 Chronic combined systolic (congestive) and diastolic (congestive) heart failure: Secondary | ICD-10-CM | POA: Insufficient documentation

## 2019-08-10 DIAGNOSIS — Z950 Presence of cardiac pacemaker: Secondary | ICD-10-CM | POA: Insufficient documentation

## 2019-08-10 DIAGNOSIS — Z886 Allergy status to analgesic agent status: Secondary | ICD-10-CM | POA: Insufficient documentation

## 2019-08-10 DIAGNOSIS — Z87891 Personal history of nicotine dependence: Secondary | ICD-10-CM | POA: Insufficient documentation

## 2019-08-10 DIAGNOSIS — Z880 Allergy status to penicillin: Secondary | ICD-10-CM | POA: Insufficient documentation

## 2019-08-10 DIAGNOSIS — E1122 Type 2 diabetes mellitus with diabetic chronic kidney disease: Secondary | ICD-10-CM | POA: Diagnosis not present

## 2019-08-10 DIAGNOSIS — Z7901 Long term (current) use of anticoagulants: Secondary | ICD-10-CM | POA: Insufficient documentation

## 2019-08-10 DIAGNOSIS — N189 Chronic kidney disease, unspecified: Secondary | ICD-10-CM | POA: Diagnosis not present

## 2019-08-10 DIAGNOSIS — I480 Paroxysmal atrial fibrillation: Secondary | ICD-10-CM | POA: Diagnosis not present

## 2019-08-10 DIAGNOSIS — I428 Other cardiomyopathies: Secondary | ICD-10-CM | POA: Insufficient documentation

## 2019-08-10 DIAGNOSIS — I130 Hypertensive heart and chronic kidney disease with heart failure and stage 1 through stage 4 chronic kidney disease, or unspecified chronic kidney disease: Secondary | ICD-10-CM | POA: Diagnosis not present

## 2019-08-10 DIAGNOSIS — Z888 Allergy status to other drugs, medicaments and biological substances status: Secondary | ICD-10-CM | POA: Insufficient documentation

## 2019-08-10 DIAGNOSIS — Z885 Allergy status to narcotic agent status: Secondary | ICD-10-CM | POA: Diagnosis not present

## 2019-08-10 DIAGNOSIS — Z8249 Family history of ischemic heart disease and other diseases of the circulatory system: Secondary | ICD-10-CM | POA: Insufficient documentation

## 2019-08-10 DIAGNOSIS — K219 Gastro-esophageal reflux disease without esophagitis: Secondary | ICD-10-CM | POA: Insufficient documentation

## 2019-08-10 DIAGNOSIS — E785 Hyperlipidemia, unspecified: Secondary | ICD-10-CM | POA: Diagnosis not present

## 2019-08-10 MED ORDER — APIXABAN 2.5 MG PO TABS: 2.5000 mg | ORAL_TABLET | Freq: Two times a day (BID) | ORAL | 6 refills | Status: DC

## 2019-08-10 NOTE — Patient Instructions (Signed)
D/c aspirin  Start Eliquis 2.5mg  twice a day

## 2019-08-10 NOTE — Progress Notes (Addendum)
Primary Care Physician: Leighton Ruff, MD Primary Cardiologist: Dr Haroldine Laws Primary Electrophysiologist: Dr Curt Bears Referring Physician: Device [facility]   Krista Mason is a 83 y.o. female with a history of HTN, PVCs, HLD, DM2, CKD, histoplasmosis, tobacco abuse, chronic systolic CHF, and new onset paroxysmal atrial fibrillation who presents for follow up in the [facility].  The patient was initially diagnosed with atrial fibrillation on device interrogation after undergoing pacemaker implant with Dr Curt Bears. She was prescribed amiodarone but has not started this yet due to concerns about side effects. She continues to have symptoms of DOE and fatigue. There were no specific triggers for her afib that she could identify.   Today, she denies symptoms of chest pain, orthopnea, PND, lower extremity edema, dizziness, presyncope, syncope, snoring, daytime somnolence, bleeding, or neurologic sequela. The patient is tolerating medications without difficulties and is otherwise without complaint today.    Atrial Fibrillation Risk Factors:  she does not have symptoms or diagnosis of sleep apnea. she does not have a history of rheumatic fever.   she has a BMI of Body mass index is 25.68 kg/m.[name] Filed Weights   08/10/19 1539  Weight: 67.9 kg    Family History  Problem Relation Age of Onset  . Heart attack Father   . Heart disease Father   . Cancer Father   . Hypertension Father   . Hypertension Brother   . Stroke Brother   . Hypertension Brother   . Diabetes Son   . Hyperlipidemia Son   . Hypertension Son      Atrial Fibrillation Management history:  Previous antiarrhythmic drugs: amiodarone Previous cardioversions: none Previous ablations: PVC ablation 2018 CHADS2VASC score: 6 Anticoagulation history: none   Past Medical History:  Diagnosis Date  . Atrial fibrillation ([place])   . Chronic combined systolic and diastolic CHF (congestive heart  failure) ([place])    a. LV dysfcuntion felt to be related to PVCs  . Chronic kidney disease   . Diabetes mellitus without complication ([place])   . GERD (gastroesophageal reflux disease)   . Hx of echocardiogram    Echo (1/16):  EF 60-65%, no RWMA, Gr 1 DD, mod AI, mod MR, mild LAE  . Hyperkalemia   . Hyperlipidemia   . Hypertension   . NICM (nonischemic cardiomyopathy) ([place])    a. 12/2018 Echo: EF 35%, diff HK w/ septal-lateral dyssynchrony. Nl RV fxn; b. 07/2019 s/p MDT [id number] [name] CRT-P MRI SureScan device (ser #: [id number])  . Orthostatic hypotension   . PVC (premature ventricular contraction)    a. s/p PVC ablation in 11/2016  . Rheumatic fever    Past Surgical History:  Procedure Laterality Date  . BIV PACEMAKER INSERTION CRT-P N/A 07/24/2019   Procedure: BIV PACEMAKER INSERTION CRT-P;  Surgeon: Constance Haw, MD;  Location: [facility];  Service: Cardiovascular;  Laterality: N/A;  . ESOPHAGOGASTRODUODENOSCOPY (EGD) WITH PROPOFOL N/A 09/25/2018   Procedure: ESOPHAGOGASTRODUODENOSCOPY (EGD) WITH PROPOFOL;  Surgeon: Ronald Lobo, MD;  Location: [facility];  Service: Endoscopy;  Laterality: N/A;  . ESOPHAGOGASTRODUODENOSCOPY (EGD) WITH PROPOFOL N/A 10/29/2018   Procedure: ESOPHAGOGASTRODUODENOSCOPY (EGD) WITH PROPOFOL;  Surgeon: Ronald Lobo, MD;  Location: [facility];  Service: Endoscopy;  Laterality: N/A;  . KNEE SURGERY Left 2009  . OVARIAN CYST REMOVAL    . PVC ABLATION N/A 11/30/2016   Procedure: PVC Ablation;  Surgeon: Will [name], MD;  Location: [facility];  Service: Cardiovascular;  Laterality: N/A;  . RIGHT  HEART CATH N/A 01/31/2018   Procedure: RIGHT HEART CATH;  Surgeon: Jolaine Artist, MD;  Location: [facility];  Service: Cardiovascular;  Laterality: N/A;  . RIGHT/LEFT HEART CATH AND CORONARY ANGIOGRAPHY N/A 01/25/2017   Procedure: Right/Left Heart Cath and Coronary Angiography;  Surgeon: Jolaine Artist, MD;  Location:  [facility];  Service: Cardiovascular;  Laterality: N/A;  . SAVORY DILATION N/A 09/25/2018   Procedure: SAVORY DILATION;  Surgeon: Ronald Lobo, MD;  Location: [facility];  Service: Endoscopy;  Laterality: N/A;  . SAVORY DILATION N/A 10/29/2018   Procedure: SAVORY DILATION;  Surgeon: Ronald Lobo, MD;  Location: [facility];  Service: Endoscopy;  Laterality: N/A;    Current Outpatient Medications  Medication Sig Dispense Refill  . acetaminophen (TYLENOL) 500 MG tablet Take 500 mg by mouth daily as needed for moderate pain.    [name] amiodarone (PACERONE) 200 MG tablet Take 1 tablet (200 mg total) by mouth daily. 90 tablet 3  . atorvastatin (LIPITOR) 20 MG tablet TAKE 1 TABLET BY MOUTH EVERY DAY (Patient taking differently: Take 20 mg by mouth at bedtime. ) 90 tablet 3  . Camphor-Eucalyptus-Menthol (VICKS VAPORUB EX) Apply 1 application topically daily as needed (toe fungus).    . carvedilol (COREG) 12.5 MG tablet Take 1 tablet (12.5 mg total) by mouth 2 (two) times daily. 180 tablet 3  . Cholecalciferol (VITAMIN D) 50 MCG (2000 UT) tablet Take 2,000 Units by mouth daily.    . furosemide (LASIX) 20 MG tablet Take 20 mg by mouth daily.    . furosemide (LASIX) 40 MG tablet Take 40 mg by mouth daily.    . hydrALAZINE (APRESOLINE) 50 MG tablet Take 1 tablet (50 mg total) by mouth 2 (two) times daily. 180 tablet 3  . omeprazole (PRILOSEC) 40 MG capsule Take 40 mg by mouth 2 (two) times daily.     Vladimir Faster Glycol-Propyl Glycol (SYSTANE OP) Place 1 drop into both eyes 2 (two) times daily as needed (dryness).    [name] apixaban (ELIQUIS) 2.5 MG TABS tablet Take 1 tablet (2.5 mg total) by mouth 2 (two) times daily. 60 tablet 6   No current facility-administered medications for this encounter.     Allergies  Allergen Reactions  . Penicillins Itching and Rash    Amoxicillin ok- IM pen is what gives reaction  Did it involve swelling of the face/tongue/throat, SOB, or low BP? No Did it involve  sudden or severe rash/hives, skin peeling, or any reaction on the inside of your mouth or nose? No Did you need to seek medical attention at a hospital or doctor's office? No When did it last happen?10 + year If all above answers are "NO", may proceed with cephalosporin use.   [name] [Cefaclor] Itching  . Crestor [Rosuvastatin Calcium] Nausea Only  . Epinephrine Other (See Comments)    "feels like heart is beating out of chest"  . Influenza Vaccines     Swelling and redness at injection site, fever  . Nsaids     Pt states she is not taking because of kidney function  . Percocet [Oxycodone-Acetaminophen] Nausea And Vomiting  . Prednisone     Dizziness, spiked blood sugar    Social History   Socioeconomic History  . Marital status: Widowed    Spouse name: Not on file  . Number of children: Not on file  . Years of education: Not on file  . Highest education level: Not on file  Occupational History  .  Not on file  Social Needs  . Financial resource strain: Not on file  . Food insecurity    Worry: Not on file    Inability: Not on file  . Transportation needs    Medical: Not on file    Non-medical: Not on file  Tobacco Use  . Smoking status: Former Smoker    Packs/day: 0.75    Years: 60.00    Pack years: 45.00    Quit date: 11/04/2016    Years since quitting: 2.7  . Smokeless tobacco: Never Used  Substance and Sexual Activity  . Alcohol use: No  . Drug use: No  . Sexual activity: Not on file  Lifestyle  . Physical activity    Days per week: Not on file    Minutes per session: Not on file  . Stress: Not on file  Relationships  . Social Herbalist on phone: Not on file    Gets together: Not on file    Attends religious service: Not on file    Active member of club or organization: Not on file    Attends meetings of clubs or organizations: Not on file    Relationship status: Not on file  . Intimate partner violence    Fear of current or ex  partner: Not on file    Emotionally abused: Not on file    Physically abused: Not on file    Forced sexual activity: Not on file  Other Topics Concern  . Not on file  Social History Narrative  . Not on file     ROS- All systems are reviewed and negative except as per the HPI above.  Physical Exam: Vitals:   08/10/19 1539  BP: (!) 176/80  Pulse: 86  SpO2: 99%  Weight: 67.9 kg  Height: 5\' 4"  (1.626 m)    GEN- The patient is well appearing elderly female, alert and oriented x 3 today.   Head- normocephalic, atraumatic Eyes-  Sclera clear, conjunctiva pink Ears- hearing intact Oropharynx- clear Neck- supple  Lungs- Clear to ausculation bilaterally, normal work of breathing Heart- irregular rate and rhythm, no murmurs, rubs or gallops  GI- soft, NT, ND, + BS Extremities- no clubbing, cyanosis, or edema MS- no significant deformity or atrophy Skin- no rash or lesion Psych- euthymic mood, full affect Neuro- strength and sensation are intact  Wt Readings from Last 3 Encounters:  08/10/19 67.9 kg  07/25/19 69.5 kg  06/30/19 69.9 kg    EKG today demonstrates A paced rhythm with prolonged conduction, LBBB, PAC, PVCs, PR 230, QRS 142, QTc 514  Echo 01/01/19 demonstrated  1. The left ventricle has a visually estimated ejection fraction of 35%. The cavity size was normal. There is mildly increased left ventricular wall thickness. Left ventricular diastolic Doppler parameters are consistent with impaired relaxation.  Diffuse hypokinesis with septal-lateral dyssynchrony.  2. The right ventricle has normal systolic function. The cavity was normal. There is no increase in right ventricular wall thickness.  3. Left atrial size was mildly dilated.  4. Trivial pericardial effusion is present.  5. Mild calcification of the mitral valve leaflet. Mitral valve regurgitation is mild to moderate by color flow Doppler. No evidence of mitral valve stenosis.  6. The aortic valve is tricuspid  Mild calcification of the aortic valve. Aortic valve regurgitation is mild by color flow Doppler. no stenosis of the aortic valve.  7. The aortic root and ascending aorta are normal in size and structure.  8.  Normal IVC size with PA systolic pressure 21 mmHg.  Epic records are reviewed at length today  Assessment and Plan:  1. Paroxysmal atrial fibrillation General education about afib discussed and questions answered. We also discussed her stroke risk and the risks and benefits of anticoagulation.  Stop ASA, start Eliquis 2.5 mg BID (age, Cr >1.5) After discussing the risks and benefits, patient agreeable to start amiodarone 200 mg daily. Recent labs and CXR reviewed.  Will plan to check Cmet/CBC/TSH on follow up.  This patients CHA2DS2-VASc Score and unadjusted Ischemic Stroke Rate (% per year) is equal to 9.7 % stroke rate/year from a score of 6  Above score calculated as 1 point each if present [CHF, HTN, DM, Vascular=MI/PAD/Aortic Plaque, Age if 65-74, or Female] Above score calculated as 2 points each if present [Age > 75, or Stroke/TIA/TE]   2. HTN Elevated today, better controlled at last office visit and at the hospital. No changes today.  3. NICM S/p CRT-P, followed by [facility], Dr Curt Bears, and Dr Haroldine Laws. No signs or symptoms of fluid overload today.   Follow up in the AF clinic in one month.   [facility] [address] [place], [place] [address] [phone] 08/10/2019 4:36 PM

## 2019-08-10 NOTE — Telephone Encounter (Signed)
I spoke to the patient who has concerns about the start of Amiodarone.  She read the paperwork that she received with the medication and is worried about side effects.  She has an appointment with [facility] fib clinic today and will bring it to their attention to further discuss.

## 2019-08-10 NOTE — Telephone Encounter (Signed)
Thank you  Lollie Marrow, [place]  08/10/2019 1:07 PM

## 2019-08-10 NOTE — Telephone Encounter (Signed)
  Pt c/o medication issue:  1. Name of Medication: amiodarone (PACERONE) 200 MG tablet  2. How are you currently taking this medication (dosage and times per day)? Take 1 tablet (200 mg total) by mouth daily.  3. Are you having a reaction (difficulty breathing--STAT)?  na  4. What is your medication issue? Patient is concerned about taking this medication due to side effects. She would like to discuss this with [place] before she starts to take this medication. Patient will be gone from home after 2 pm.

## 2019-08-18 ENCOUNTER — Other Ambulatory Visit: Payer: Self-pay

## 2019-08-18 ENCOUNTER — Emergency Department (HOSPITAL_COMMUNITY): Payer: Medicare HMO

## 2019-08-18 ENCOUNTER — Inpatient Hospital Stay (HOSPITAL_COMMUNITY)
Admission: EM | Admit: 2019-08-18 | Discharge: 2019-08-25 | DRG: 368 | Disposition: A | Discharge status: Home Health | Payer: Medicare HMO | Attending: Internal Medicine | Admitting: Internal Medicine

## 2019-08-18 ENCOUNTER — Telehealth: Payer: Self-pay | Admitting: Cardiology

## 2019-08-18 ENCOUNTER — Encounter (HOSPITAL_COMMUNITY): Payer: Self-pay

## 2019-08-18 DIAGNOSIS — K921 Melena: Secondary | ICD-10-CM | POA: Diagnosis not present

## 2019-08-18 DIAGNOSIS — Z950 Presence of cardiac pacemaker: Secondary | ICD-10-CM | POA: Diagnosis not present

## 2019-08-18 DIAGNOSIS — E118 Type 2 diabetes mellitus with unspecified complications: Secondary | ICD-10-CM

## 2019-08-18 DIAGNOSIS — K922 Gastrointestinal hemorrhage, unspecified: Secondary | ICD-10-CM | POA: Diagnosis present

## 2019-08-18 DIAGNOSIS — Z66 Do not resuscitate: Secondary | ICD-10-CM | POA: Diagnosis not present

## 2019-08-18 DIAGNOSIS — M19042 Primary osteoarthritis, left hand: Secondary | ICD-10-CM | POA: Diagnosis not present

## 2019-08-18 DIAGNOSIS — Z887 Allergy status to serum and vaccine status: Secondary | ICD-10-CM

## 2019-08-18 DIAGNOSIS — Z881 Allergy status to other antibiotic agents status: Secondary | ICD-10-CM

## 2019-08-18 DIAGNOSIS — I130 Hypertensive heart and chronic kidney disease with heart failure and stage 1 through stage 4 chronic kidney disease, or unspecified chronic kidney disease: Secondary | ICD-10-CM | POA: Diagnosis not present

## 2019-08-18 DIAGNOSIS — K635 Polyp of colon: Secondary | ICD-10-CM | POA: Diagnosis not present

## 2019-08-18 DIAGNOSIS — Z888 Allergy status to other drugs, medicaments and biological substances status: Secondary | ICD-10-CM

## 2019-08-18 DIAGNOSIS — E1151 Type 2 diabetes mellitus with diabetic peripheral angiopathy without gangrene: Secondary | ICD-10-CM | POA: Diagnosis not present

## 2019-08-18 DIAGNOSIS — N179 Acute kidney failure, unspecified: Secondary | ICD-10-CM | POA: Diagnosis present

## 2019-08-18 DIAGNOSIS — Z833 Family history of diabetes mellitus: Secondary | ICD-10-CM | POA: Diagnosis not present

## 2019-08-18 DIAGNOSIS — R52 Pain, unspecified: Secondary | ICD-10-CM

## 2019-08-18 DIAGNOSIS — I5043 Acute on chronic combined systolic (congestive) and diastolic (congestive) heart failure: Secondary | ICD-10-CM | POA: Diagnosis present

## 2019-08-18 DIAGNOSIS — K2101 Gastro-esophageal reflux disease with esophagitis, with bleeding: Secondary | ICD-10-CM | POA: Diagnosis not present

## 2019-08-18 DIAGNOSIS — N183 Chronic kidney disease, stage 3 unspecified: Secondary | ICD-10-CM | POA: Diagnosis present

## 2019-08-18 DIAGNOSIS — K5730 Diverticulosis of large intestine without perforation or abscess without bleeding: Secondary | ICD-10-CM | POA: Diagnosis not present

## 2019-08-18 DIAGNOSIS — Z20828 Contact with and (suspected) exposure to other viral communicable diseases: Secondary | ICD-10-CM | POA: Diagnosis not present

## 2019-08-18 DIAGNOSIS — I2510 Atherosclerotic heart disease of native coronary artery without angina pectoris: Secondary | ICD-10-CM | POA: Diagnosis present

## 2019-08-18 DIAGNOSIS — K219 Gastro-esophageal reflux disease without esophagitis: Secondary | ICD-10-CM | POA: Diagnosis present

## 2019-08-18 DIAGNOSIS — Z7901 Long term (current) use of anticoagulants: Secondary | ICD-10-CM | POA: Diagnosis not present

## 2019-08-18 DIAGNOSIS — I10 Essential (primary) hypertension: Secondary | ICD-10-CM | POA: Diagnosis not present

## 2019-08-18 DIAGNOSIS — J449 Chronic obstructive pulmonary disease, unspecified: Secondary | ICD-10-CM | POA: Diagnosis present

## 2019-08-18 DIAGNOSIS — N1832 Chronic kidney disease, stage 3b: Secondary | ICD-10-CM

## 2019-08-18 DIAGNOSIS — I5042 Chronic combined systolic (congestive) and diastolic (congestive) heart failure: Secondary | ICD-10-CM | POA: Diagnosis present

## 2019-08-18 DIAGNOSIS — E782 Mixed hyperlipidemia: Secondary | ICD-10-CM | POA: Diagnosis present

## 2019-08-18 DIAGNOSIS — D649 Anemia, unspecified: Secondary | ICD-10-CM | POA: Diagnosis not present

## 2019-08-18 DIAGNOSIS — M7989 Other specified soft tissue disorders: Secondary | ICD-10-CM | POA: Diagnosis not present

## 2019-08-18 DIAGNOSIS — D120 Benign neoplasm of cecum: Secondary | ICD-10-CM | POA: Diagnosis not present

## 2019-08-18 DIAGNOSIS — K222 Esophageal obstruction: Secondary | ICD-10-CM | POA: Diagnosis not present

## 2019-08-18 DIAGNOSIS — Z23 Encounter for immunization: Secondary | ICD-10-CM | POA: Diagnosis not present

## 2019-08-18 DIAGNOSIS — E1169 Type 2 diabetes mellitus with other specified complication: Secondary | ICD-10-CM | POA: Diagnosis present

## 2019-08-18 DIAGNOSIS — D509 Iron deficiency anemia, unspecified: Secondary | ICD-10-CM | POA: Diagnosis present

## 2019-08-18 DIAGNOSIS — I480 Paroxysmal atrial fibrillation: Secondary | ICD-10-CM | POA: Diagnosis not present

## 2019-08-18 DIAGNOSIS — R0602 Shortness of breath: Secondary | ICD-10-CM | POA: Diagnosis not present

## 2019-08-18 DIAGNOSIS — K209 Esophagitis, unspecified without bleeding: Secondary | ICD-10-CM | POA: Diagnosis not present

## 2019-08-18 DIAGNOSIS — N184 Chronic kidney disease, stage 4 (severe): Secondary | ICD-10-CM | POA: Diagnosis not present

## 2019-08-18 DIAGNOSIS — I080 Rheumatic disorders of both mitral and aortic valves: Secondary | ICD-10-CM | POA: Diagnosis present

## 2019-08-18 DIAGNOSIS — Z79899 Other long term (current) drug therapy: Secondary | ICD-10-CM

## 2019-08-18 DIAGNOSIS — K2100 Gastro-esophageal reflux disease with esophagitis, without bleeding: Secondary | ICD-10-CM | POA: Diagnosis not present

## 2019-08-18 DIAGNOSIS — R000 Tachycardia, unspecified: Secondary | ICD-10-CM | POA: Diagnosis not present

## 2019-08-18 DIAGNOSIS — I4891 Unspecified atrial fibrillation: Secondary | ICD-10-CM | POA: Diagnosis not present

## 2019-08-18 DIAGNOSIS — I951 Orthostatic hypotension: Secondary | ICD-10-CM | POA: Diagnosis not present

## 2019-08-18 DIAGNOSIS — K228 Other specified diseases of esophagus: Secondary | ICD-10-CM | POA: Diagnosis not present

## 2019-08-18 DIAGNOSIS — M1990 Unspecified osteoarthritis, unspecified site: Secondary | ICD-10-CM

## 2019-08-18 DIAGNOSIS — I739 Peripheral vascular disease, unspecified: Secondary | ICD-10-CM | POA: Diagnosis not present

## 2019-08-18 DIAGNOSIS — I5022 Chronic systolic (congestive) heart failure: Secondary | ICD-10-CM | POA: Diagnosis not present

## 2019-08-18 DIAGNOSIS — Z880 Allergy status to penicillin: Secondary | ICD-10-CM

## 2019-08-18 DIAGNOSIS — R5383 Other fatigue: Secondary | ICD-10-CM | POA: Diagnosis not present

## 2019-08-18 DIAGNOSIS — Z885 Allergy status to narcotic agent status: Secondary | ICD-10-CM

## 2019-08-18 DIAGNOSIS — Z886 Allergy status to analgesic agent status: Secondary | ICD-10-CM

## 2019-08-18 DIAGNOSIS — I428 Other cardiomyopathies: Secondary | ICD-10-CM | POA: Diagnosis present

## 2019-08-18 DIAGNOSIS — Z87891 Personal history of nicotine dependence: Secondary | ICD-10-CM

## 2019-08-18 DIAGNOSIS — Z8249 Family history of ischemic heart disease and other diseases of the circulatory system: Secondary | ICD-10-CM

## 2019-08-18 DIAGNOSIS — E1122 Type 2 diabetes mellitus with diabetic chronic kidney disease: Secondary | ICD-10-CM | POA: Diagnosis not present

## 2019-08-18 DIAGNOSIS — R531 Weakness: Secondary | ICD-10-CM | POA: Diagnosis not present

## 2019-08-18 LAB — TYPE AND SCREEN
ABO/RH(D): A POS
Antibody Screen: NEGATIVE

## 2019-08-18 LAB — CBC WITH DIFFERENTIAL/PLATELET
Abs Immature Granulocytes: 0.04 10*3/uL (ref 0.00–0.07)
Basophils Absolute: 0 10*3/uL (ref 0.0–0.1)
Basophils Relative: 0 %
Eosinophils Absolute: 0.1 10*3/uL (ref 0.0–0.5)
Eosinophils Relative: 1 %
HCT: 31.4 % — ABNORMAL LOW (ref 36.0–46.0)
Hemoglobin: 10.5 g/dL — ABNORMAL LOW (ref 12.0–15.0)
Immature Granulocytes: 1 %
Lymphocytes Relative: 14 %
Lymphs Abs: 1 10*3/uL (ref 0.7–4.0)
MCH: 29.8 pg (ref 26.0–34.0)
MCHC: 33.4 g/dL (ref 30.0–36.0)
MCV: 89.2 fL (ref 80.0–100.0)
Monocytes Absolute: 0.6 10*3/uL (ref 0.1–1.0)
Monocytes Relative: 8 %
Neutro Abs: 5.6 10*3/uL (ref 1.7–7.7)
Neutrophils Relative %: 76 %
Platelets: 229 10*3/uL (ref 150–400)
RBC: 3.52 MIL/uL — ABNORMAL LOW (ref 3.87–5.11)
RDW: 14 % (ref 11.5–15.5)
WBC: 7.3 10*3/uL (ref 4.0–10.5)
nRBC: 0 % (ref 0.0–0.2)

## 2019-08-18 LAB — BRAIN NATRIURETIC PEPTIDE: B Natriuretic Peptide: 348.9 pg/mL — ABNORMAL HIGH (ref 0.0–100.0)

## 2019-08-18 LAB — COMPREHENSIVE METABOLIC PANEL
ALT: 17 U/L (ref 0–44)
AST: 18 U/L (ref 15–41)
Albumin: 3.5 g/dL (ref 3.5–5.0)
Alkaline Phosphatase: 81 U/L (ref 38–126)
Anion gap: 12 (ref 5–15)
BUN: 33 mg/dL — ABNORMAL HIGH (ref 8–23)
CO2: 25 mmol/L (ref 22–32)
Calcium: 9.4 mg/dL (ref 8.9–10.3)
Chloride: 99 mmol/L (ref 98–111)
Creatinine, Ser: 2 mg/dL — ABNORMAL HIGH (ref 0.44–1.00)
GFR calc Af Amer: 26 mL/min — ABNORMAL LOW (ref >60)
GFR calc non Af Amer: 23 mL/min — ABNORMAL LOW (ref >60)
Glucose, Bld: 171 mg/dL — ABNORMAL HIGH (ref 70–99)
Potassium: 3.4 mmol/L — ABNORMAL LOW (ref 3.5–5.1)
Sodium: 136 mmol/L (ref 135–145)
Total Bilirubin: 0.9 mg/dL (ref 0.3–1.2)
Total Protein: 6.6 g/dL (ref 6.5–8.1)

## 2019-08-18 LAB — POC OCCULT BLOOD, ED: Fecal Occult Bld: POSITIVE — AB

## 2019-08-18 LAB — PROTIME-INR
INR: 1.4 — ABNORMAL HIGH (ref 0.8–1.2)
Prothrombin Time: 17.4 seconds — ABNORMAL HIGH (ref 11.4–15.2)

## 2019-08-18 LAB — CBG MONITORING, ED: Glucose-Capillary: 163 mg/dL — ABNORMAL HIGH (ref 70–99)

## 2019-08-18 LAB — TROPONIN I (HIGH SENSITIVITY)
Troponin I (High Sensitivity): 17 ng/L (ref <18)
Troponin I (High Sensitivity): 19 ng/L — ABNORMAL HIGH (ref <18)

## 2019-08-18 LAB — APTT: aPTT: 36 seconds (ref 24–36)

## 2019-08-18 LAB — ABO/RH: ABO/RH(D): A POS

## 2019-08-18 LAB — GLUCOSE, CAPILLARY: Glucose-Capillary: 128 mg/dL — ABNORMAL HIGH (ref 70–99)

## 2019-08-18 LAB — SARS CORONAVIRUS 2 (TAT 6-24 HRS): SARS Coronavirus 2: NEGATIVE

## 2019-08-18 MED ORDER — CARVEDILOL 12.5 MG PO TABS
12.5000 mg | ORAL_TABLET | Freq: Two times a day (BID) | ORAL | Status: DC
  Administered 2019-08-18 – 2019-08-25 (×14): 12.5 mg via ORAL
  Filled 2019-08-18 (×15): qty 1

## 2019-08-18 MED ORDER — TRAZODONE HCL 50 MG PO TABS: 25.0000 mg | ORAL_TABLET | Freq: Every evening | ORAL | Status: DC | PRN

## 2019-08-18 MED ORDER — SODIUM CHLORIDE 0.9 % IV SOLN
8.0000 mg/h | INTRAVENOUS | Status: DC
  Administered 2019-08-18 – 2019-08-19 (×4): 8 mg/h via INTRAVENOUS
  Filled 2019-08-18 (×7): qty 80

## 2019-08-18 MED ORDER — HYDRALAZINE HCL 50 MG PO TABS
50.0000 mg | ORAL_TABLET | Freq: Two times a day (BID) | ORAL | Status: DC
  Administered 2019-08-18 – 2019-08-20 (×5): 50 mg via ORAL
  Filled 2019-08-18 (×3): qty 1
  Filled 2019-08-18: qty 2
  Filled 2019-08-18 (×2): qty 1

## 2019-08-18 MED ORDER — SODIUM CHLORIDE 0.9 % IV SOLN: INTRAVENOUS | Status: AC

## 2019-08-18 MED ORDER — ATORVASTATIN CALCIUM 10 MG PO TABS
20.0000 mg | ORAL_TABLET | Freq: Every day | ORAL | Status: DC
  Administered 2019-08-18 – 2019-08-24 (×7): 20 mg via ORAL
  Filled 2019-08-18 (×7): qty 2

## 2019-08-18 MED ORDER — FUROSEMIDE 20 MG PO TABS
20.0000 mg | ORAL_TABLET | Freq: Every day | ORAL | Status: DC
  Administered 2019-08-19: 11:00:00 20 mg via ORAL
  Filled 2019-08-18 (×2): qty 1

## 2019-08-18 MED ORDER — POLYETHYL GLYCOL-PROPYL GLYCOL 0.4-0.3 % OP GEL
Freq: Two times a day (BID) | OPHTHALMIC | Status: DC | PRN
  Filled 2019-08-18: qty 10

## 2019-08-18 MED ORDER — ACETAMINOPHEN 650 MG RE SUPP: 650.0000 mg | Freq: Four times a day (QID) | RECTAL | Status: DC | PRN

## 2019-08-18 MED ORDER — INSULIN ASPART 100 UNIT/ML ~~LOC~~ SOLN
0.0000 [IU] | Freq: Three times a day (TID) | SUBCUTANEOUS | Status: DC
  Administered 2019-08-19 (×3): 2 [IU] via SUBCUTANEOUS
  Administered 2019-08-20: 18:00:00 3 [IU] via SUBCUTANEOUS
  Administered 2019-08-20: 09:00:00 2 [IU] via SUBCUTANEOUS
  Administered 2019-08-21: 3 [IU] via SUBCUTANEOUS
  Administered 2019-08-21 – 2019-08-23 (×3): 2 [IU] via SUBCUTANEOUS
  Administered 2019-08-23: 3 [IU] via SUBCUTANEOUS
  Administered 2019-08-24: 2 [IU] via SUBCUTANEOUS
  Administered 2019-08-24: 18:00:00 3 [IU] via SUBCUTANEOUS
  Administered 2019-08-25: 2 [IU] via SUBCUTANEOUS

## 2019-08-18 MED ORDER — ACETAMINOPHEN 325 MG PO TABS: 650.0000 mg | ORAL_TABLET | Freq: Four times a day (QID) | ORAL | Status: DC | PRN

## 2019-08-18 MED ORDER — AMIODARONE HCL 200 MG PO TABS
200.0000 mg | ORAL_TABLET | Freq: Every day | ORAL | Status: DC
  Administered 2019-08-19 – 2019-08-25 (×7): 200 mg via ORAL
  Filled 2019-08-18 (×8): qty 1

## 2019-08-18 MED ORDER — POLYVINYL ALCOHOL 1.4 % OP SOLN
1.0000 [drp] | Freq: Two times a day (BID) | OPHTHALMIC | Status: DC | PRN
  Filled 2019-08-18: qty 15

## 2019-08-18 MED ORDER — SODIUM CHLORIDE 0.9 % IV SOLN
80.0000 mg | Freq: Once | INTRAVENOUS | Status: AC
  Administered 2019-08-18: 80 mg via INTRAVENOUS
  Filled 2019-08-18 (×2): qty 80

## 2019-08-18 NOTE — Telephone Encounter (Signed)
Called patient back about her message. Patient complaining of weakness, dizziness, black stool and nausea. Patient stated she has been getting worse since her office visit on 08/10/19. Asked patient if she has called her PCP. Patient stated she has not seen her PCP in over a year. Patient stated she needs to talk to [facility], Dr. Curt Bears nurse, that she knows her. Informed patient that a message would be sent to Dr. Curt Bears nurse, but she should still try to call her PCP with her symptoms. Asked patient if she has any BP readings? Patient stated no that she can't use her BP machine since her pacemaker was placed. Patient stated she feels weak and dizzy when she stands up. Patient also stated she has not been eating much.  Encouraged patient to change positions slowly and to make sure she is taking her eliquis with food. Will forward to Dr. Curt Bears and his nurse for advisement.

## 2019-08-18 NOTE — Telephone Encounter (Signed)
° ° °  Patient calling to report weakness, dizziness, black stool, nausea, worse overnight

## 2019-08-18 NOTE — ED Notes (Signed)
Pt's monitor turned to palliative care to relieve pt's repeated complaints of beeping per MD Norin's orders

## 2019-08-18 NOTE — ED Provider Notes (Signed)
4:41 PM Sign out from resident at shift change.  Patient with recent biventricular pacemaker placement, heart failure, anticoagulated on apixaban.  Patient presents with increasing weakness, shortness of breath, dizziness with standing, and black stools consistent with melena.  Patient reports constipation over the past several days but had a large black tarry stool this morning.  Confirmed to be heme positive by previous provider.  Hemoglobin is in the tens.  Patient is followed by Dr. Cristina Gong of GI.   I have ordered IV Protonix for the patient.  No history of liver disease or varices.  Given vital signs and hemoglobin, do not feel that she needs acute reversal of her anticoagulation at this point will need to be closely monitored.  I have seen patient.  She reports significant lightheadedness with standing.  We discussed need for admission and she agrees.  We will touch base with her GI physician and admit to hospitalist service.  BP (!) 146/71   Pulse 71   Temp 99.4 F (37.4 C) (Oral)   Resp 20   Ht 5\' 4"  (1.626 m)   Wt 66.8 kg   SpO2 98%   BMI 25.27 kg/m   5:14 PM Spoke with Dr. Linda Hedges who will see patient.   BP (!) 157/54   Pulse 69   Temp 99.4 F (37.4 C) (Oral)   Resp (!) 21   Ht 5\' 4"  (1.626 m)   Wt 66.8 kg   SpO2 99%   BMI 25.27 kg/m   6:08 PM Callback from Dr. Michail Sermon, aware that patient is here. Will consult tomorrow.     Carlisle Cater, PA-C 08/18/19 Evalee Jefferson    Charlesetta Shanks, MD 08/19/19 0030

## 2019-08-18 NOTE — ED Notes (Signed)
Main lab to add on BNP 

## 2019-08-18 NOTE — H&P (Signed)
History and Physical    Krista Mason:[id number] DOB: [birth year] DOA: 08/18/2019  PCP: Leighton Ruff, MD (Confirm with patient/family/NH records and if not entered, this has to be entered at [facility] point of entry) Patient coming from: Pt is coming from home  I have personally briefly reviewed patient's old medical records in [place]  Chief Complaint: Increased SOB and melanic stools  HPI: Krista Mason is a 83 y.o. female with medical history significant of CHF, s/p PPM placement 3 weeks ago, A. Fib started on Eliquis 1 weeks ago for stroke prevention 2/2 CHA2DS2-VASc score of 6, started 1 week ago on Amiodarone. Last echo 3/12 with EF 35%. She is followed in the a.fib and CHF clinics. She has chronic esophageal stricture and is s/p dilatation x 5 last 10/29/2018. She has no h/o PUD but does take PPI bid. She has chronic SOB for months unimproved after PPM. Over the last 24-48 hours she has felt weak, light-headed with continued, but not worsening, SOB. She has had several episodes of dark black tarry stools. Due to her symptoms she presents to [facility].   ED Course: Hemodynamically stable. On exam had melanotic stool heme positive. Hgb 10g. She was orthostatic by report. TRH asked to admit for UGI bleed.   Review of Systems: As per HPI otherwise 10 point review of systems negative.    Past Medical History:  Diagnosis Date   Atrial fibrillation (HCC)    Chronic combined systolic and diastolic CHF (congestive heart failure) ([place])    a. LV dysfcuntion felt to be related to PVCs   Chronic kidney disease    Diabetes mellitus without complication (HCC)    GERD (gastroesophageal reflux disease)    Hx of echocardiogram    Echo (1/16):  EF 60-65%, no RWMA, Gr 1 DD, mod AI, mod MR, mild LAE   Hyperkalemia    Hyperlipidemia    Hypertension    NICM (nonischemic cardiomyopathy) ([place])    a. 12/2018 Echo: EF 35%, diff HK w/ septal-lateral dyssynchrony. Nl RV fxn; b. 07/2019 s/p  MDT [id number] [name] CRT-P MRI SureScan device (ser #: [id number])   Orthostatic hypotension    PVC (premature ventricular contraction)    a. s/p PVC ablation in 11/2016   Rheumatic fever     Past Surgical History:  Procedure Laterality Date   BIV PACEMAKER INSERTION CRT-P N/A 07/24/2019   Procedure: BIV PACEMAKER INSERTION CRT-P;  Surgeon: Constance Haw, MD;  Location: [facility];  Service: Cardiovascular;  Laterality: N/A;   ESOPHAGOGASTRODUODENOSCOPY (EGD) WITH PROPOFOL N/A 09/25/2018   Procedure: ESOPHAGOGASTRODUODENOSCOPY (EGD) WITH PROPOFOL;  Surgeon: Ronald Lobo, MD;  Location: [facility];  Service: Endoscopy;  Laterality: N/A;   ESOPHAGOGASTRODUODENOSCOPY (EGD) WITH PROPOFOL N/A 10/29/2018   Procedure: ESOPHAGOGASTRODUODENOSCOPY (EGD) WITH PROPOFOL;  Surgeon: Ronald Lobo, MD;  Location: [facility];  Service: Endoscopy;  Laterality: N/A;   KNEE SURGERY Left 2009   OVARIAN CYST REMOVAL     PVC ABLATION N/A 11/30/2016   Procedure: PVC Ablation;  Surgeon: Will [name], MD;  Location: [facility];  Service: Cardiovascular;  Laterality: N/A;   RIGHT HEART CATH N/A 01/31/2018   Procedure: RIGHT HEART CATH;  Surgeon: Jolaine Artist, MD;  Location: [facility];  Service: Cardiovascular;  Laterality: N/A;   RIGHT/LEFT HEART CATH AND CORONARY ANGIOGRAPHY N/A 01/25/2017   Procedure: Right/Left Heart Cath and Coronary Angiography;  Surgeon: Jolaine Artist, MD;  Location: [facility];  Service: Cardiovascular;  Laterality: N/A;   SAVORY DILATION N/A 09/25/2018   Procedure: SAVORY DILATION;  Surgeon: Ronald Lobo, MD;  Location: [facility];  Service: Endoscopy;  Laterality: N/A;   SAVORY DILATION N/A 10/29/2018   Procedure: SAVORY DILATION;  Surgeon: Ronald Lobo, MD;  Location: [facility];  Service: Endoscopy;  Laterality: N/A;    Social Hx - widowed for over 30 years. 2 daughters. 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 Lives alone but daughter is close-by.   reports that she quit smoking about 2 years ago. She has a 45.00 pack-year smoking history. She has never used smokeless tobacco. She reports that she does not drink alcohol or use drugs.  Allergies  Allergen Reactions   Penicillins Itching and Rash    Amoxicillin ok- IM pen is what gives reaction  Did it involve swelling of the face/tongue/throat, SOB, or low BP? No Did it involve sudden or severe rash/hives, skin peeling, or any reaction on the inside of your mouth or nose? No Did you need to seek medical attention at a hospital or doctor's office? No When did it last happen?10 + year If all above answers are NO, may proceed with cephalosporin use.    Ceclor [Cefaclor] Itching   Crestor [Rosuvastatin Calcium] Nausea Only   Epinephrine Other (See Comments)    "feels like heart is beating out of chest"   Influenza Vaccines     Swelling and redness at injection site, fever   Nsaids     Pt states she is not taking because of kidney function   Percocet [Oxycodone-Acetaminophen] Nausea And Vomiting   Prednisone     Dizziness, spiked blood sugar    Family History  Problem Relation Age of Onset   Heart attack Father    Heart disease Father    Cancer Father    Hypertension Father    Hypertension Brother    Stroke Brother    Hypertension Brother    Diabetes Son    Hyperlipidemia Son    Hypertension Son    Unacceptable: Noncontributory, unremarkable, or negative. Acceptable: Family history reviewed and not pertinent (If you reviewed it)  Prior to Admission medications   Medication Sig Start Date End Date Taking? Authorizing Provider  amiodarone (PACERONE) 200 MG tablet Take 1 tablet (200 mg total) by mouth daily. 08/07/19  Yes [name], PA-C  apixaban (ELIQUIS) 2.5 MG TABS tablet Take 1 tablet (2.5 mg total) by mouth 2 (two) times daily. 08/10/19   Yes Fenton, Clint R, PA  atorvastatin (LIPITOR) 20 MG tablet TAKE 1 TABLET BY MOUTH EVERY DAY Patient taking differently: Take 20 mg by mouth at bedtime.  01/21/19  Yes Bensimhon, Shaune Pascal, MD  Camphor-Eucalyptus-Menthol (VICKS VAPORUB EX) Apply 1 application topically daily as needed (toe fungus).   Yes [provider]  carvedilol (COREG) 12.5 MG tablet Take 1 tablet (12.5 mg total) by mouth 2 (two) times daily. 07/27/19 10/25/19 Yes Camnitz, Will Hassell Done, MD  Cholecalciferol (VITAMIN D) 50 MCG (2000 UT) tablet Take 2,000 Units by mouth daily.   Yes [provider]  furosemide (LASIX) 20 MG tablet Take 20 mg by mouth daily.   Yes [provider]  hydrALAZINE (APRESOLINE) 50 MG tablet Take 1 tablet (50 mg total) by mouth 2 (two) times daily. 06/11/19  Yes Bensimhon, Shaune Pascal, MD  omeprazole (PRILOSEC) 40 MG capsule Take 40 mg by mouth 2 (two) times daily.  Yes [provider]  Polyethyl Glycol-Propyl Glycol (SYSTANE OP) Place 1 drop into both eyes 2 (two) times daily as needed (dryness).   Yes [provider]    Physical Exam: Vitals:   08/18/19 1415 08/18/19 1700 08/18/19 1730 08/18/19 1815  BP: (!) 146/71 (!) 157/54 (!) 128/54 (!) 146/61  Pulse: 71 69 77 71  Resp: 20 (!) 21 18 (!) 30  Temp:      TempSrc:      SpO2: 98% 99% 98% 97%  Weight:      Height:        Constitutional: NAD, calm, comfortable Vitals:   08/18/19 1415 08/18/19 1700 08/18/19 1730 08/18/19 1815  BP: (!) 146/71 (!) 157/54 (!) 128/54 (!) 146/61  Pulse: 71 69 77 71  Resp: 20 (!) 21 18 (!) 30  Temp:      TempSrc:      SpO2: 98% 99% 98% 97%  Weight:      Height:       General appearance: - WNWD woman looking younger than her stated age in no distress. Eyes: PERRL, lids and conjunctivae normal ENMT: Mucous membranes are moist. Posterior pharynx clear of any exudate or lesions.full implants mandible, native maxilla..  Neck: normal, supple, no masses, no  thyromegaly Respiratory: clear to auscultation bilaterally, no wheezing, feint bibasilar crackles. Normal respiratory effort. No accessory muscle use.  Cardiovascular: Irregularly irregular, II/VI systolic mm best RSB, /o rubs / gallops. No extremity edema. 2+ pedal pulses. No carotid bruits.  Abdomen: tender to deep palpation at epigastrum, no masses palpated. No hepatosplenomegaly. Bowel sounds positive.  Musculoskeletal: no clubbing / cyanosis. No joint deformity upper and lower extremities. Good ROM, no contractures. Normal muscle tone.  Skin: no rashes, lesions, ulcers. No induration Neurologic: CN 2-12 grossly intact. Sensation intact, DTR normal. Strength 5/5 in all 4.  Psychiatric: Normal judgment and insight. Alert and oriented x 3. Normal mood.     Labs on Admission: I have personally reviewed following labs and imaging studies  CBC: Recent Labs  Lab 08/18/19 1511  WBC 7.3  NEUTROABS 5.6  HGB 10.5*  HCT 31.4*  MCV 89.2  PLT 751   Basic Metabolic Panel: Recent Labs  Lab 08/18/19 1511  NA 136  K 3.4*  CL 99  CO2 25  GLUCOSE 171*  BUN 33*  CREATININE 2.00*  CALCIUM 9.4   GFR: Estimated Creatinine Clearance: 20.7 mL/min (A) (by C-G formula based on SCr of 2 mg/dL (H)). Liver Function Tests: Recent Labs  Lab 08/18/19 1511  AST 18  ALT 17  ALKPHOS 81  BILITOT 0.9  PROT 6.6  ALBUMIN 3.5   No results for input(s): LIPASE, AMYLASE in the last 168 hours. No results for input(s): AMMONIA in the last 168 hours. Coagulation Profile: Recent Labs  Lab 08/18/19 1511  INR 1.4*   Cardiac Enzymes: No results for input(s): CKTOTAL, CKMB, CKMBINDEX, TROPONINI in the last 168 hours. BNP (last 3 results) No results for input(s): PROBNP in the last 8760 hours. HbA1C: No results for input(s): HGBA1C in the last 72 hours. CBG: Recent Labs  Lab 08/18/19 1504  GLUCAP 163*   Lipid Profile: No results for input(s): CHOL, HDL, LDLCALC, TRIG, CHOLHDL, LDLDIRECT in the  last 72 hours. Thyroid Function Tests: No results for input(s): TSH, T4TOTAL, FREET4, T3FREE, THYROIDAB in the last 72 hours. Anemia Panel: No results for input(s): VITAMINB12, FOLATE, FERRITIN, TIBC, IRON, RETICCTPCT in the last 72 hours. Urine analysis:    Component Value Date/Time  COLORURINE STRAW (A) 01/28/2018 [place] 01/28/2018 1353   LABSPEC 1.006 01/28/2018 1353   PHURINE 6.0 01/28/2018 1353   GLUCOSEU NEGATIVE 01/28/2018 1353   HGBUR NEGATIVE 01/28/2018 1353   BILIRUBINUR NEGATIVE 01/28/2018 1353   KETONESUR NEGATIVE 01/28/2018 1353   PROTEINUR NEGATIVE 01/28/2018 1353   UROBILINOGEN 0.2 04/15/2012 1451   NITRITE NEGATIVE 01/28/2018 [place] 01/28/2018 1353    Radiological Exams on Admission: Dg Chest Portable 1 View  Result Date: 08/18/2019 CLINICAL DATA:  Shortness of breath EXAM: PORTABLE CHEST 1 VIEW COMPARISON:  July 25, 2019 FINDINGS: No edema or consolidation. There is cardiomegaly with pulmonary vascularity normal. There is aortic atherosclerosis. Pacemaker leads are attached to the right atrium, right ventricle, and coronary sinus. There is a calcified aortopulmonary window lymph node, stable. No pneumothorax. No bone lesions. IMPRESSION: No edema or consolidation. Stable cardiomegaly. Stable pacemaker lead placement. Stable calcified lymph node in the aortopulmonary window region consistent with prior granulomatous disease. Aortic Atherosclerosis (ICD10-I70.0). Electronically Signed   By: Lowella Grip III M.D.   On: 08/18/2019 16:11    EKG: Independently reviewed. No EKG done in ED  Assessment/Plan Active Problems:   Gastrointestinal hemorrhage with melena   Uncontrolled hypertension   CKD (chronic kidney disease), stage III (HCC)   Diabetes mellitus with complication (HCC)   Chronic systolic CHF (congestive heart failure) (HCC)   UGI bleed  (please populate well all problems here in Problem List. (For example,  if patient is on BP meds at home and you resume or decide to hold them, it is a problem that needs to be her. Same for CAD, COPD, HLD and so on)   1. UGI bleed - patient on eliquis with development of melanic stools and symptoms of light-headedness. Stool heme positive. At last EGD stomach was normal. No h/o PUD. She takes PPI bid at home. BP is stable. Hgb 10g, 10/15 was 11.4, 9/8 was 12.4, 01/31/18 was 10.5. Plan Tele admit  Eliquis stopped  IV PPI infusion  H/H q12 hours  [facility] notified by EDP  2. Cardio - patient with CHF with LVEF 35% 01/01/19 Echo. Recent onset PAF in setting of frequent PVCs and NSVT. Had PPM placed 3 weeks ago. Was started on Eliquis for stroke risk reduction. Amiodarone for ectopy. She has been SOB/DOE for months.  Plan Tele admit  Will continue all cardiac meds, including diuretic, except for Eliquis.   Cardiology notified and will see in AM   3. DM - Last A1C 06/11/2019 6.9%. Patient takes no medications Plan When taking diet (now NPO) will be low carb  Will follow glycemic protocol - sliding scale.  4. CKD - Cr 2.0, 2.13 9/8, 2.34 08/09/18. Stable condition. Continue home meds  5. HTN - will continue home meds.    DVT prophylaxis: SCDs (Lovenox/Heparin/SCD's/anticoagulated/None (if comfort care) Code Status: DNR/DNI (Full/Partial (specify details) Family Communication: spoke with daughter and answered all questions. (Specify name, relationship. Do not write "discussed with patient". Specify tel # if discussed over the phone) Disposition Plan: home 2-3 days (specify when and where you expect patient to be discharged) Consults called: [facility] has been called; Cardiology has been asked to see her in AM (with names) Admission status: inpatient. (inpatient / obs / tele / medical floor / SDU)   Adella Hare MD [facility] Pager [phone]  If 7PM-7AM, please contact night-coverage [email or web address] Password [email or web address]  08/18/2019, 7:09 PM

## 2019-08-18 NOTE — ED Provider Notes (Signed)
[facility] EMERGENCY DEPARTMENT Provider Note   CSN: [id number] Arrival date & time: 08/18/19  1329     History   Chief Complaint Chief Complaint  Patient presents with  . Shortness of Breath    HPI Krista Mason is a 83 y.o. female.      Shortness of Breath Severity:  Mild Onset quality:  Gradual Duration:  3 days Timing:  Constant Progression:  Worsening Chronicity:  New Context: activity   Context comment:  Patient has been having dark, melanotic stools for the past approximately 2-1/2 weeks.  Notably she was started on Eliquis for PAF on 08/10/2023 Relieved by:  Rest Worsened by:  Activity Ineffective treatments:  None tried Associated symptoms: headaches   Associated symptoms: no abdominal pain, no chest pain, no cough, no ear pain, no fever, no rash, no sore throat, no syncope and no vomiting     Past Medical History:  Diagnosis Date  . Atrial fibrillation ([place])   . Chronic combined systolic and diastolic CHF (congestive heart failure) ([place])    a. LV dysfcuntion felt to be related to PVCs  . Chronic kidney disease   . Diabetes mellitus without complication ([place])   . GERD (gastroesophageal reflux disease)   . Hx of echocardiogram    Echo (1/16):  EF 60-65%, no RWMA, Gr 1 DD, mod AI, mod MR, mild LAE  . Hyperkalemia   . Hyperlipidemia   . Hypertension   . NICM (nonischemic cardiomyopathy) ([place])    a. 12/2018 Echo: EF 35%, diff HK w/ septal-lateral dyssynchrony. Nl RV fxn; b. 07/2019 s/p MDT [id number] [name] CRT-P MRI SureScan device (ser #: [id number])  . Orthostatic hypotension   . PVC (premature ventricular contraction)    a. s/p PVC ablation in 11/2016  . Rheumatic fever     Patient Active Problem List   Diagnosis Date Noted  . Nonischemic cardiomyopathy ([place]) 07/24/2019  . Acute hypoxemic respiratory failure ([place])   . COPD  GOLD 0  01/17/2017  . Chronic systolic CHF (congestive heart failure) ([place]) 11/13/2016  . PVC  (premature ventricular contraction) 11/13/2016  . Mixed hyperlipidemia 11/05/2016  . CKD (chronic kidney disease), stage III ([place]) 11/05/2016  . Tobacco abuse 11/05/2016  . Multifocal PVCs 11/05/2016  . Moderate mitral regurgitation 11/05/2016  . Microcytic anemia 11/05/2016  . Diabetes mellitus with complication ([place])   . Uncontrolled hypertension 06/18/2016  . Multifocal atrial tachycardia ([place]) 11/26/2014  . PVD (peripheral vascular disease) ([place]) 12/15/2012    Past Surgical History:  Procedure Laterality Date  . BIV PACEMAKER INSERTION CRT-P N/A 07/24/2019   Procedure: BIV PACEMAKER INSERTION CRT-P;  Surgeon: Constance Haw, MD;  Location: [facility];  Service: Cardiovascular;  Laterality: N/A;  . ESOPHAGOGASTRODUODENOSCOPY (EGD) WITH PROPOFOL N/A 09/25/2018   Procedure: ESOPHAGOGASTRODUODENOSCOPY (EGD) WITH PROPOFOL;  Surgeon: Ronald Lobo, MD;  Location: [facility];  Service: Endoscopy;  Laterality: N/A;  . ESOPHAGOGASTRODUODENOSCOPY (EGD) WITH PROPOFOL N/A 10/29/2018   Procedure: ESOPHAGOGASTRODUODENOSCOPY (EGD) WITH PROPOFOL;  Surgeon: Ronald Lobo, MD;  Location: [facility];  Service: Endoscopy;  Laterality: N/A;  . KNEE SURGERY Left 2009  . OVARIAN CYST REMOVAL    . PVC ABLATION N/A 11/30/2016   Procedure: PVC Ablation;  Surgeon: Will [name], MD;  Location: [facility];  Service: Cardiovascular;  Laterality: N/A;  . RIGHT HEART CATH N/A 01/31/2018   Procedure: RIGHT HEART CATH;  Surgeon: Jolaine Artist, MD;  Location: [facility];  Service: Cardiovascular;  Laterality: N/A;  . RIGHT/LEFT HEART CATH AND CORONARY ANGIOGRAPHY N/A 01/25/2017   Procedure: Right/Left Heart Cath and Coronary Angiography;  Surgeon: Jolaine Artist, MD;  Location: [facility];  Service: Cardiovascular;  Laterality: N/A;  . SAVORY DILATION N/A 09/25/2018   Procedure: SAVORY DILATION;  Surgeon: Ronald Lobo, MD;  Location: [facility];  Service:  Endoscopy;  Laterality: N/A;  . SAVORY DILATION N/A 10/29/2018   Procedure: SAVORY DILATION;  Surgeon: Ronald Lobo, MD;  Location: [facility];  Service: Endoscopy;  Laterality: N/A;     OB History   No obstetric history on file.      Home Medications    Prior to Admission medications   Medication Sig Start Date End Date Taking? Authorizing Provider  acetaminophen (TYLENOL) 500 MG tablet Take 500 mg by mouth daily as needed for moderate pain.    [provider]  amiodarone (PACERONE) 200 MG tablet Take 1 tablet (200 mg total) by mouth daily. 08/07/19   [name], PA-C  apixaban (ELIQUIS) 2.5 MG TABS tablet Take 1 tablet (2.5 mg total) by mouth 2 (two) times daily. 08/10/19   Fenton, Clint R, PA  atorvastatin (LIPITOR) 20 MG tablet TAKE 1 TABLET BY MOUTH EVERY DAY Patient taking differently: Take 20 mg by mouth at bedtime.  01/21/19   Bensimhon, Shaune Pascal, MD  Camphor-Eucalyptus-Menthol (VICKS VAPORUB EX) Apply 1 application topically daily as needed (toe fungus).    [provider]  carvedilol (COREG) 12.5 MG tablet Take 1 tablet (12.5 mg total) by mouth 2 (two) times daily. 07/27/19 10/25/19  Camnitz, Ocie Doyne, MD  Cholecalciferol (VITAMIN D) 50 MCG (2000 UT) tablet Take 2,000 Units by mouth daily.    [provider]  furosemide (LASIX) 20 MG tablet Take 20 mg by mouth daily.    [provider]  furosemide (LASIX) 40 MG tablet Take 40 mg by mouth daily. 07/29/19   [provider]  hydrALAZINE (APRESOLINE) 50 MG tablet Take 1 tablet (50 mg total) by mouth 2 (two) times daily. 06/11/19   Bensimhon, Shaune Pascal, MD  omeprazole (PRILOSEC) 40 MG capsule Take 40 mg by mouth 2 (two) times daily.     [provider]  Polyethyl Glycol-Propyl Glycol (SYSTANE OP) Place 1 drop into both eyes 2 (two) times daily as needed (dryness).    [provider]    Family History Family History  Problem Relation Age of Onset  . Heart  attack Father   . Heart disease Father   . Cancer Father   . Hypertension Father   . Hypertension Brother   . Stroke Brother   . Hypertension Brother   . Diabetes Son   . Hyperlipidemia Son   . Hypertension Son     Social History Social History   Tobacco Use  . Smoking status: Former Smoker    Packs/day: 0.75    Years: 60.00    Pack years: 45.00    Quit date: 11/04/2016    Years since quitting: 2.7  . Smokeless tobacco: Never Used  Substance Use Topics  . Alcohol use: No  . Drug use: No     Allergies   Penicillins, Ceclor [cefaclor], Crestor [rosuvastatin calcium], Epinephrine, Influenza vaccines, Nsaids, Percocet [oxycodone-acetaminophen], and Prednisone   Review of Systems Review of Systems  Constitutional: Positive for fatigue. Negative for chills and fever.  HENT: Negative for ear pain and sore throat.   Eyes: Negative for pain and visual disturbance.  Respiratory: Positive for shortness  of breath. Negative for cough.   Cardiovascular: Negative for chest pain, palpitations and syncope.  Gastrointestinal: Positive for blood in stool. Negative for abdominal pain and vomiting.  Genitourinary: Negative for dysuria, hematuria and vaginal bleeding.  Musculoskeletal: Negative for arthralgias and back pain.  Skin: Negative for color change and rash.  Neurological: Positive for light-headedness and headaches. Negative for seizures and syncope.  All other systems reviewed and are negative.    Physical Exam Updated Vital Signs BP (!) 146/71   Pulse 71   Temp 99.4 F (37.4 C) (Oral)   Resp 20   Ht 5\' 4"  (1.626 m)   Wt 66.8 kg   SpO2 98%   BMI 25.27 kg/m   Physical Exam Vitals signs and nursing note reviewed.  Constitutional:      General: She is not in acute distress.    Appearance: She is well-developed. She is ill-appearing.  HENT:     Head: Normocephalic and atraumatic.  Eyes:     Conjunctiva/sclera: Conjunctivae normal.  Neck:     Musculoskeletal:  Neck supple.  Cardiovascular:     Rate and Rhythm: Normal rate and regular rhythm.     Heart sounds: No murmur.     Comments: Pacemaker rhythm on the monitor Pulmonary:     Effort: Pulmonary effort is normal. No tachypnea, accessory muscle usage or respiratory distress.     Breath sounds: Normal breath sounds. No decreased breath sounds, wheezing or rales.  Abdominal:     Palpations: Abdomen is soft.     Tenderness: There is no abdominal tenderness.  Genitourinary:    Comments: No external hemorrhoids or fissures noted.  Normal-appearing external female genitalia and perirectal area.  Dark melanotic stool in the rectal vault.  No bright red blood Skin:    General: Skin is warm and dry.  Neurological:     Mental Status: She is alert.      ED Treatments / Results  Labs (all labs ordered are listed, but only abnormal results are displayed) Labs Reviewed  CBC WITH DIFFERENTIAL/PLATELET - Abnormal; Notable for the following components:      Result Value   RBC 3.52 (*)    Hemoglobin 10.5 (*)    HCT 31.4 (*)    All other components within normal limits  COMPREHENSIVE METABOLIC PANEL - Abnormal; Notable for the following components:   Potassium 3.4 (*)    Glucose, Bld 171 (*)    BUN 33 (*)    Creatinine, Ser 2.00 (*)    GFR calc non Af Amer 23 (*)    GFR calc Af Amer 26 (*)    All other components within normal limits  PROTIME-INR - Abnormal; Notable for the following components:   Prothrombin Time 17.4 (*)    INR 1.4 (*)    All other components within normal limits  CBG MONITORING, ED - Abnormal; Notable for the following components:   Glucose-Capillary 163 (*)    All other components within normal limits  POC OCCULT BLOOD, ED - Abnormal; Notable for the following components:   Fecal Occult Bld POSITIVE (*)    All other components within normal limits  APTT  BRAIN NATRIURETIC PEPTIDE  TYPE AND SCREEN  ABO/RH  TROPONIN I (HIGH SENSITIVITY)  TROPONIN I (HIGH SENSITIVITY)     EKG EKG Interpretation  Date/Time:  Tuesday August 18 2019 13:33:24 EDT Ventricular Rate:  70 PR Interval:    QRS Duration: 144 QT Interval:  459 QTC Calculation: 496 R Axis:   -  116 Text Interpretation: Sinus rhythm Right bundle branch block Inferior infarct, old Anterior infarct, old Lateral leads are also involved Confirmed by Gerlene Fee [id number]) on 08/18/2019 2:11:27 PM   Radiology Dg Chest Portable 1 View  Result Date: 08/18/2019 CLINICAL DATA:  Shortness of breath EXAM: PORTABLE CHEST 1 VIEW COMPARISON:  July 25, 2019 FINDINGS: No edema or consolidation. There is cardiomegaly with pulmonary vascularity normal. There is aortic atherosclerosis. Pacemaker leads are attached to the right atrium, right ventricle, and coronary sinus. There is a calcified aortopulmonary window lymph node, stable. No pneumothorax. No bone lesions. IMPRESSION: No edema or consolidation. Stable cardiomegaly. Stable pacemaker lead placement. Stable calcified lymph node in the aortopulmonary window region consistent with prior granulomatous disease. Aortic Atherosclerosis (ICD10-I70.0). Electronically Signed   By: Lowella Grip III M.D.   On: 08/18/2019 16:11    Procedures Procedures (including critical care time)  Medications Ordered in ED Medications - No data to display   Initial Impression / Assessment and Plan / ED Course  I have reviewed the triage vital signs and the nursing notes.  Pertinent labs & imaging results that were available during my care of the patient were reviewed by me and considered in my medical decision making (see chart for details).        Patient is an 83 year old female with history of physical exam as above presents emergency department for evaluation of fatigue, generalized weakness, shortness of breath as well as dark tarry stools that have been present for the last 2-1/2 weeks.  Notably patient was just recently started on Eliquis.  History and physical exam  concerning for symptomatic anemia.  Patient has a history of congestive heart failure with implanted pacemaker.  Melanotic stool on rectal exam.  Labs and chest x-ray are pending at time of transition of care however patient will likely require admission for further work-up and management as she is actively bleeding and anticoagulated with Eliquis.  Patient seen in conjunction with my attending physician Dr. Sedonia Small  Final Clinical Impressions(s) / ED Diagnoses   Final diagnoses:  Gastrointestinal hemorrhage with melena    ED Discharge Orders    None       Romona Curls, MD 08/18/19 1709    Maudie Flakes, MD 08/19/19 1222

## 2019-08-18 NOTE — ED Notes (Addendum)
ED TO INPATIENT HANDOFF REPORT  ED Nurse Name and Phone #: Jinny Blossom #[phone]  S Name/Age/Gender Krista Mason 83 y.o. female Room/Bed: 021C/021C  Code Status   Code Status: Full Code  Home/SNF/Other SNF Patient oriented to: self, place, time and situation Is this baseline? Yes   Triage Complete: Triage complete  Chief Complaint sob  Triage Note Pt coming from living facility for exertional SOB, dark tarry stools, orthostatic changes. Pacemaker as of 07/24/19, pt says full-time, EMS reports on demand for them, both A&V Significant cardiac hx, on blood thinner VSS, 98% RA, EMS put 2L supportive, 100% 166/66 75 24 98.1 cbg 240   Allergies Allergies  Allergen Reactions  . Penicillins Itching and Rash    Amoxicillin ok- IM pen is what gives reaction  Did it involve swelling of the face/tongue/throat, SOB, or low BP? No Did it involve sudden or severe rash/hives, skin peeling, or any reaction on the inside of your mouth or nose? No Did you need to seek medical attention at a hospital or doctor's office? No When did it last happen?10 + year If all above answers are "NO", may proceed with cephalosporin use.   [name] [Cefaclor] Itching  . Crestor [Rosuvastatin Calcium] Nausea Only  . Epinephrine Other (See Comments)    "feels like heart is beating out of chest"  . Influenza Vaccines     Swelling and redness at injection site, fever  . Nsaids     Pt states she is not taking because of kidney function  . Percocet [Oxycodone-Acetaminophen] Nausea And Vomiting  . Prednisone     Dizziness, spiked blood sugar    Level of Care/Admitting Diagnosis ED Disposition    ED Disposition Condition [facility] [place] [[id number]]  Level of Care: Telemetry Cardiac [103]  Covid Evaluation: Asymptomatic Screening Protocol (No Symptoms)  Diagnosis: UGI bleed [[id number]]  Admitting Physician: Neena Rhymes [[id number]]  Attending Physician:  Adella Hare E [[id number]]  Estimated length of stay: 3 - 4 days  Certification:: I certify this patient will need inpatient services for at least 2 midnights  PT Class (Do Not Modify): Inpatient [101]  PT Acc Code (Do Not Modify): Private [1]       B Medical/Surgery History Past Medical History:  Diagnosis Date  . Atrial fibrillation ([place])   . Chronic combined systolic and diastolic CHF (congestive heart failure) ([place])    a. LV dysfcuntion felt to be related to PVCs  . Chronic kidney disease   . Diabetes mellitus without complication ([place])   . GERD (gastroesophageal reflux disease)   . Hx of echocardiogram    Echo (1/16):  EF 60-65%, no RWMA, Gr 1 DD, mod AI, mod MR, mild LAE  . Hyperkalemia   . Hyperlipidemia   . Hypertension   . NICM (nonischemic cardiomyopathy) ([place])    a. 12/2018 Echo: EF 35%, diff HK w/ septal-lateral dyssynchrony. Nl RV fxn; b. 07/2019 s/p MDT [id number] [name] CRT-P MRI SureScan device (ser #: [id number])  . Orthostatic hypotension   . PVC (premature ventricular contraction)    a. s/p PVC ablation in 11/2016  . Rheumatic fever    Past Surgical History:  Procedure Laterality Date  . BIV PACEMAKER INSERTION CRT-P N/A 07/24/2019   Procedure: BIV PACEMAKER INSERTION CRT-P;  Surgeon: Constance Haw, MD;  Location: [place] [facility];  Service: Cardiovascular;  Laterality: N/A;  . ESOPHAGOGASTRODUODENOSCOPY (EGD) WITH PROPOFOL N/A 09/25/2018   Procedure: ESOPHAGOGASTRODUODENOSCOPY (  EGD) WITH PROPOFOL;  Surgeon: Ronald Lobo, MD;  Location: [facility];  Service: Endoscopy;  Laterality: N/A;  . ESOPHAGOGASTRODUODENOSCOPY (EGD) WITH PROPOFOL N/A 10/29/2018   Procedure: ESOPHAGOGASTRODUODENOSCOPY (EGD) WITH PROPOFOL;  Surgeon: Ronald Lobo, MD;  Location: [facility];  Service: Endoscopy;  Laterality: N/A;  . KNEE SURGERY Left 2009  . OVARIAN CYST REMOVAL    . PVC ABLATION N/A 11/30/2016   Procedure: PVC Ablation;  Surgeon: Will [name], MD;   Location: [facility];  Service: Cardiovascular;  Laterality: N/A;  . RIGHT HEART CATH N/A 01/31/2018   Procedure: RIGHT HEART CATH;  Surgeon: Jolaine Artist, MD;  Location: [facility];  Service: Cardiovascular;  Laterality: N/A;  . RIGHT/LEFT HEART CATH AND CORONARY ANGIOGRAPHY N/A 01/25/2017   Procedure: Right/Left Heart Cath and Coronary Angiography;  Surgeon: Jolaine Artist, MD;  Location: [place] [facility];  Service: Cardiovascular;  Laterality: N/A;  . SAVORY DILATION N/A 09/25/2018   Procedure: SAVORY DILATION;  Surgeon: Ronald Lobo, MD;  [facility];  Service: Endoscopy;  Laterality: N/A;  . SAVORY DILATION N/A 10/29/2018   Procedure: SAVORY DILATION;  Surgeon: Ronald Lobo, MD;  Location: [facility];  Service: Endoscopy;  Laterality: N/A;     A IV Location/Drains/Wounds Patient Lines/Drains/Airways Status   Active Line/Drains/Airways    Name:   Placement date:   Placement time:   Site:   Days:   Peripheral IV 08/18/19 Right Antecubital   08/18/19    -    Antecubital   less than 1          Intake/Output Last 24 hours No intake or output data in the 24 hours ending 08/18/19 2152  Labs/Imaging Results for orders placed or performed during the hospital encounter of 08/18/19 (from the past 48 hour(s))  POC CBG, ED     Status: Abnormal   Collection Time: 08/18/19  3:04 PM  Result Value Ref Range   Glucose-Capillary 163 (H) 70 - 99 mg/dL  CBC with Differential     Status: Abnormal   Collection Time: 08/18/19  3:11 PM  Result Value Ref Range   WBC 7.3 4.0 - 10.5 K/uL   RBC 3.52 (L) 3.87 - 5.11 MIL/uL   Hemoglobin 10.5 (L) 12.0 - 15.0 g/dL   HCT 31.4 (L) 36.0 - 46.0 %   MCV 89.2 80.0 - 100.0 fL   MCH 29.8 26.0 - 34.0 pg   MCHC 33.4 30.0 - 36.0 g/dL   RDW 14.0 11.5 - 15.5 %   Platelets 229 150 - 400 K/uL   nRBC 0.0 0.0 - 0.2 %   Neutrophils Relative % 76 %   Neutro Abs 5.6 1.7 - 7.7 K/uL   Lymphocytes Relative 14 %   Lymphs Abs 1.0  0.7 - 4.0 K/uL   Monocytes Relative 8 %   Monocytes Absolute 0.6 0.1 - 1.0 K/uL   Eosinophils Relative 1 %   Eosinophils Absolute 0.1 0.0 - 0.5 K/uL   Basophils Relative 0 %   Basophils Absolute 0.0 0.0 - 0.1 K/uL   Immature Granulocytes 1 %   Abs Immature Granulocytes 0.04 0.00 - 0.07 K/uL    Comment: Performed at [facility], [address] [place], [place] [address]  Comprehensive metabolic panel     Status: Abnormal   Collection Time: 08/18/19  3:11 PM  Result Value Ref Range   Sodium 136 135 - 145 mmol/L   Potassium 3.4 (L) 3.5 - 5.1 mmol/L  Chloride 99 98 - 111 mmol/L   CO2 25 22 - 32 mmol/L   Glucose, Bld 171 (H) 70 - 99 mg/dL   BUN 33 (H) 8 - 23 mg/dL   Creatinine, Ser 2.00 (H) 0.44 - 1.00 mg/dL   Calcium 9.4 8.9 - 10.3 mg/dL   Total Protein 6.6 6.5 - 8.1 g/dL   Albumin 3.5 3.5 - 5.0 g/dL   AST 18 15 - 41 U/L   ALT 17 0 - 44 U/L   Alkaline Phosphatase 81 38 - 126 U/L   Total Bilirubin 0.9 0.3 - 1.2 mg/dL   GFR calc non Af Amer 23 (L) >60 mL/min   GFR calc Af Amer 26 (L) >60 mL/min   Anion gap 12 5 - 15    Comment: Performed at [facility], [address], [place], [place] [address]  Troponin I (High Sensitivity)     Status: None   Collection Time: 08/18/19  3:11 PM  Result Value Ref Range   Troponin I (High Sensitivity) 17 <18 ng/L    Comment: (NOTE) Elevated high sensitivity troponin I (hsTnI) values and significant  changes across serial measurements may suggest ACS but many other  chronic and acute conditions are known to elevate hsTnI results.  Refer to the "Links" section for chest pain algorithms and additional  guidance. Performed at [facility], [place] [address] [place], [place] [address]   Type and screen [place]     Status: None   Collection Time: 08/18/19  3:11 PM  Result Value Ref Range   ABO/RH(D) A POS    Antibody Screen NEG    Sample Expiration      08/21/2019,2359 Performed at [facility], [place] [address] [place], [place] [address]   Protime-INR     Status: Abnormal   Collection Time: 08/18/19  3:11 PM  Result Value Ref Range   Prothrombin Time 17.4 (H) 11.4 - 15.2 seconds   INR 1.4 (H) 0.8 - 1.2    Comment: (NOTE) INR goal varies based on device and disease states. Performed at [facility], [place] [address], [place], [place] [address]   APTT     Status: None   Collection Time: 08/18/19  3:11 PM  Result Value Ref Range   aPTT 36 24 - 36 seconds    Comment: Performed at [facility] [address], [place], [place] [address]  Brain natriuretic peptide     Status: Abnormal   Collection Time: 08/18/19  3:11 PM  Result Value Ref Range   B Natriuretic Peptide 348.9 (H) 0.0 - 100.0 pg/mL    Comment: Performed at [facility] [address] [place], [place] [address]  ABO/Rh     Status: None   Collection Time: 08/18/19  3:11 PM  Result Value Ref Range   ABO/RH(D)      A POS Performed at [facility] [address] [place], [place] [address]   POC occult blood, ED Provider will collect     Status: Abnormal   Collection Time: 08/18/19  4:06 PM  Result Value Ref Range   Fecal Occult Bld POSITIVE (A) NEGATIVE  Troponin I (High Sensitivity)     Status: Abnormal   Collection Time: 08/18/19  4:16 PM  Result Value Ref Range   Troponin I (High Sensitivity) 19 (H) <18 ng/L    Comment: (NOTE) Elevated high sensitivity troponin I (hsTnI) values and  significant  changes across serial measurements may suggest ACS but many other  chronic and acute conditions are known to elevate hsTnI results.  Refer to the "Links" section for chest pain algorithms and additional  guidance. Performed at [facility], [place] [address], [place], [place] [address]    Dg Chest Portable 1 View  Result Date: 08/18/2019 CLINICAL DATA:  Shortness of breath EXAM: PORTABLE CHEST 1 VIEW COMPARISON:  July 25, 2019 FINDINGS: No edema or consolidation. There is  cardiomegaly with pulmonary vascularity normal. There is aortic atherosclerosis. Pacemaker leads are attached to the right atrium, right ventricle, and coronary sinus. There is a calcified aortopulmonary window lymph node, stable. No pneumothorax. No bone lesions. IMPRESSION: No edema or consolidation. Stable cardiomegaly. Stable pacemaker lead placement. Stable calcified lymph node in the aortopulmonary window region consistent with prior granulomatous disease. Aortic Atherosclerosis (ICD10-I70.0). Electronically Signed   By: Lowella Grip III M.D.   On: 08/18/2019 16:11    Pending Labs Unresulted Labs (From admission, onward)    Start     Ordered   08/19/19 0500  Hemoglobin and hematocrit, blood  5A & 5P,   R (with STAT occurrences)     08/18/19 1824   08/18/19 1633  SARS CORONAVIRUS 2 (TAT 6-24 HRS) Nasopharyngeal Nasopharyngeal Swab  (Asymptomatic/Tier 2 Patients Labs)  Once,   STAT    Question Answer Comment  Is this test for diagnosis or screening Screening   Symptomatic for COVID-19 as defined by CDC No   Hospitalized for COVID-19 No   Admitted to ICU for COVID-19 No   Previously tested for COVID-19 Yes   Resident in a congregate (group) care setting Yes   Employed in healthcare setting No   Pregnant No      08/18/19 1632          Vitals/Pain Today's Vitals   08/18/19 1700 08/18/19 1730 08/18/19 1815 08/18/19 1845  BP: (!) 157/54 (!) 128/54 (!) 146/61 (!) 175/48  Pulse: 69 77 71 72  Resp: (!) 21 18 (!) 30 18  Temp:      TempSrc:      SpO2: 99% 98% 97% 99%  Weight:      Height:      PainSc:        Isolation Precautions No active isolations  Medications Medications  pantoprazole (PROTONIX) 80 mg in sodium chloride 0.9 % 250 mL (0.32 mg/mL) infusion (8 mg/hr Intravenous New Bag/Given 08/18/19 1724)  amiodarone (PACERONE) tablet 200 mg (200 mg Oral Refused 08/18/19 1940)  atorvastatin (LIPITOR) tablet 20 mg (20 mg Oral Given 08/18/19 2133)  carvedilol (COREG)  tablet 12.5 mg (12.5 mg Oral Given 08/18/19 2133)  furosemide (LASIX) tablet 20 mg (20 mg Oral Refused 08/18/19 1940)  hydrALAZINE (APRESOLINE) tablet 50 mg (50 mg Oral Given 08/18/19 2133)  0.9 %  sodium chloride infusion (has no administration in time range)  acetaminophen (TYLENOL) tablet 650 mg (has no administration in time range)    Or  acetaminophen (TYLENOL) suppository 650 mg (has no administration in time range)  traZODone (DESYREL) tablet 25 mg (has no administration in time range)  insulin aspart (novoLOG) injection 0-15 Units (has no administration in time range)  polyvinyl alcohol (LIQUIFILM TEARS) 1.4 % ophthalmic solution 1 drop (1 drop Both Eyes Refused 08/18/19 2133)  pantoprazole (PROTONIX) 80 mg in sodium chloride 0.9 % 100 mL IVPB (0 mg Intravenous Stopped 08/18/19 1745)    Mobility walks with device Moderate fall risk   Focused Assessments  Cardiac Assessment Handoff:  Cardiac Rhythm: Atrial fibrillation Lab Results  Component Value Date   CKTOTAL 71 04/09/2017   TROPONINI <0.03 01/29/2018   No results found for: DDIMER Does the Patient currently have chest pain? No     R Recommendations: See Admitting Provider Note  Report given to:   Additional Notes:

## 2019-08-18 NOTE — Telephone Encounter (Signed)
Advised pt she needs to be seen by PCP today, per Dr. Curt Bears. Advised that she needs blood work and occult blood testing d/t reported issues & new Eliquis start in the AFib clinic on 10/19. Pt very SOB on the phone. Reports SOB not new, but worsened.  Reports chronic anemia also. States she has telemedicine visit w/ PCP at noon today to discuss issues further. Pt appreciates the return call about this.

## 2019-08-18 NOTE — ED Triage Notes (Signed)
Pt coming from living facility for exertional SOB, dark tarry stools, orthostatic changes. Pacemaker as of 07/24/19, pt says full-time, EMS reports on demand for them, both A&V Significant cardiac hx, on blood thinner VSS, 98% RA, EMS put 2L supportive, 100% 166/66 75 24 98.1 cbg 240

## 2019-08-19 ENCOUNTER — Encounter (HOSPITAL_COMMUNITY): Payer: Self-pay | Admitting: Physician Assistant

## 2019-08-19 DIAGNOSIS — I480 Paroxysmal atrial fibrillation: Secondary | ICD-10-CM

## 2019-08-19 DIAGNOSIS — K922 Gastrointestinal hemorrhage, unspecified: Secondary | ICD-10-CM

## 2019-08-19 DIAGNOSIS — I10 Essential (primary) hypertension: Secondary | ICD-10-CM

## 2019-08-19 LAB — HEMOGLOBIN AND HEMATOCRIT, BLOOD
HCT: 31.1 % — ABNORMAL LOW (ref 36.0–46.0)
HCT: 30.2 % — ABNORMAL LOW (ref 36.0–46.0)
Hemoglobin: 10 g/dL — ABNORMAL LOW (ref 12.0–15.0)
Hemoglobin: 9.6 g/dL — ABNORMAL LOW (ref 12.0–15.0)

## 2019-08-19 LAB — GLUCOSE, CAPILLARY
Glucose-Capillary: 139 mg/dL — ABNORMAL HIGH (ref 70–99)
Glucose-Capillary: 139 mg/dL — ABNORMAL HIGH (ref 70–99)
Glucose-Capillary: 133 mg/dL — ABNORMAL HIGH (ref 70–99)
Glucose-Capillary: 122 mg/dL — ABNORMAL HIGH (ref 70–99)

## 2019-08-19 MED ORDER — POTASSIUM CHLORIDE CRYS ER 20 MEQ PO TBCR
40.0000 meq | EXTENDED_RELEASE_TABLET | Freq: Once | ORAL | Status: AC
  Administered 2019-08-19: 40 meq via ORAL
  Filled 2019-08-19: qty 2

## 2019-08-19 NOTE — Consult Note (Addendum)
Cardiology Consultation:   Patient ID: LANIER FELTY; [id number]; [birth year]   Admit date: 08/18/2019 Date of Consult: 08/19/2019  Primary Care Provider: Leighton Ruff, MD Primary Cardiologist: Glori Bickers, MD 06/11/2019 Primary Electrophysiologist:  Constance Haw, MD 08/19/2019 in-hospital   Patient Profile:   Krista Mason is a 83 y.o. female with a hx of of HTN, PVCs, HLD, DM2, CKD III, histoplasmosis, tobacco abuse, chronic S-CHF, NICM w/ EF 35-40%, LBBB s/p MDT CRT-P, mod MR, and new persistent atrial fibrillation on Eliquis since 08/10/2019 who is being seen today for the evaluation of CHF at the request of Dr Ree Kida.  History of Present Illness:   Ms. Mukherjee was seen by Dr Haroldine Laws 08/20, PYP scan neg for TTR & Dr Beryle Beams did not feel she had myeloma. CPX 02/2018 showed sig limitations, w/ CHF >lung dz & deconditioning as culprit. Consider advanced therapies.   Ms Kohlenberg came to the ER 10/27 for weakness, she was light-headed and worsened SOB. Fatigue as well. Orthostatic VS were positive.   Her weight at home was stable or a little down. Her weight was in the 150s, but has been 147 at home. She had DOE going to the bathroom, but denies. She sleeps in a recliner, chronically (x 3 years), but denies PND. Very little LE edema. However, she retains fluid in her abdomen, not necessarily in her legs. Has not recently felt that her abdomen was not tight.   Ms Gange started having black stools before she went on the Eliquis. She has a [place] history of iron deficiency anemia, but tolerated po iron poorly, no longer on iron. Stools were not consistently black until after the Eliquis., it was more consistent. Dr Michail Sermon saw today and stools were heme +. Eliquis stopped, IV PPI ordered. EGD planned for am.    Past Medical History:  Diagnosis Date   Atrial fibrillation ([place])    Chronic combined systolic and diastolic CHF (congestive heart failure) ([place])    a. LV  dysfcuntion felt to be related to PVCs   Chronic kidney disease    Diabetes mellitus without complication (HCC)    GERD (gastroesophageal reflux disease)    Hx of echocardiogram    Echo (1/16):  EF 60-65%, no RWMA, Gr 1 DD, mod AI, mod MR, mild LAE   Hyperkalemia    Hyperlipidemia    Hypertension    NICM (nonischemic cardiomyopathy) ([place])    a. 12/2018 Echo: EF 35%, diff HK w/ septal-lateral dyssynchrony. Nl RV fxn; b. 07/2019 s/p MDT [id number] [name] CRT-P MRI SureScan device (ser #: [id number])   Orthostatic hypotension    PVC (premature ventricular contraction)    a. s/p PVC ablation in 11/2016   Rheumatic fever     Past Surgical History:  Procedure Laterality Date   BIV PACEMAKER INSERTION CRT-P N/A 07/24/2019   Procedure: BIV PACEMAKER INSERTION CRT-P;  Surgeon: Constance Haw, MD;  Location: [facility];  Service: Cardiovascular;  Laterality: N/A;   ESOPHAGOGASTRODUODENOSCOPY (EGD) WITH PROPOFOL N/A 09/25/2018   Procedure: ESOPHAGOGASTRODUODENOSCOPY (EGD) WITH PROPOFOL;  Surgeon: Ronald Lobo, MD;  Location: [facility];  Service: Endoscopy;  Laterality: N/A;   ESOPHAGOGASTRODUODENOSCOPY (EGD) WITH PROPOFOL N/A 10/29/2018   Procedure: ESOPHAGOGASTRODUODENOSCOPY (EGD) WITH PROPOFOL;  Surgeon: Ronald Lobo, MD;  Location: [facility];  Service: Endoscopy;  Laterality: N/A;   KNEE SURGERY Left 2009   OVARIAN CYST REMOVAL     PVC ABLATION N/A 11/30/2016   Procedure: PVC Ablation;  Surgeon: [employer],  MD;  Location: [facility];  Service: Cardiovascular;  Laterality: N/A;   RIGHT HEART CATH N/A 01/31/2018   Procedure: RIGHT HEART CATH;  Surgeon: Jolaine Artist, MD;  Location: [facility];  Service: Cardiovascular;  Laterality: N/A;   RIGHT/LEFT HEART CATH AND CORONARY ANGIOGRAPHY N/A 01/25/2017   Procedure: Right/Left Heart Cath and Coronary Angiography;  Surgeon: Jolaine Artist, MD;  Location: [facility];   Service: Cardiovascular;  Laterality: N/A;   SAVORY DILATION N/A 09/25/2018   Procedure: SAVORY DILATION;  Surgeon: Ronald Lobo, MD;  Location: [facility];  Service: Endoscopy;  Laterality: N/A;   SAVORY DILATION N/A 10/29/2018   Procedure: SAVORY DILATION;  Surgeon: Ronald Lobo, MD;  Location: [facility];  Service: Endoscopy;  Laterality: N/A;     Prior to Admission medications   Medication Sig Start Date End Date Taking? Authorizing Provider  amiodarone (PACERONE) 200 MG tablet Take 1 tablet (200 mg total) by mouth daily. 08/07/19  Yes [name], PA-C  apixaban (ELIQUIS) 2.5 MG TABS tablet Take 1 tablet (2.5 mg total) by mouth 2 (two) times daily. 08/10/19  Yes Fenton, Clint R, PA  atorvastatin (LIPITOR) 20 MG tablet TAKE 1 TABLET BY MOUTH EVERY DAY Patient taking differently: Take 20 mg by mouth at bedtime.  01/21/19  Yes Bensimhon, Shaune Pascal, MD  Camphor-Eucalyptus-Menthol (VICKS VAPORUB EX) Apply 1 application topically daily as needed (toe fungus).   Yes [provider]  carvedilol (COREG) 12.5 MG tablet Take 1 tablet (12.5 mg total) by mouth 2 (two) times daily. 07/27/19 10/25/19 Yes Camnitz, Will Hassell Done, MD  Cholecalciferol (VITAMIN D) 50 MCG (2000 UT) tablet Take 2,000 Units by mouth daily.   Yes [provider]  furosemide (LASIX) 20 MG tablet Take 20 mg by mouth daily.   Yes [provider]  hydrALAZINE (APRESOLINE) 50 MG tablet Take 1 tablet (50 mg total) by mouth 2 (two) times daily. 06/11/19  Yes Bensimhon, Shaune Pascal, MD  omeprazole (PRILOSEC) 40 MG capsule Take 40 mg by mouth 2 (two) times daily.    Yes [provider]  Polyethyl Glycol-Propyl Glycol (SYSTANE OP) Place 1 drop into both eyes 2 (two) times daily as needed (dryness).   Yes [provider]    Inpatient Medications: Scheduled Meds:  amiodarone  200 mg Oral Daily   atorvastatin  20 mg Oral QHS   carvedilol  12.5 mg Oral BID   furosemide  20 mg Oral  Daily   hydrALAZINE  50 mg Oral BID   insulin aspart  0-15 Units Subcutaneous TID WC   potassium chloride  40 mEq Oral Once   Continuous Infusions:  sodium chloride     pantoprozole (PROTONIX) infusion 8 mg/hr (08/19/19 1358)   PRN Meds: acetaminophen **OR** acetaminophen, polyvinyl alcohol, traZODone  Allergies:    Allergies  Allergen Reactions   Penicillins Itching and Rash    Amoxicillin ok- IM pen is what gives reaction  Did it involve swelling of the face/tongue/throat, SOB, or low BP? No Did it involve sudden or severe rash/hives, skin peeling, or any reaction on the inside of your mouth or nose? No Did you need to seek medical attention at a hospital or doctor's office? No When did it last happen?10 + year If all above answers are NO, may proceed with cephalosporin use.    Ceclor [Cefaclor] Itching   Crestor [Rosuvastatin Calcium] Nausea Only   Epinephrine Other (See Comments)    "feels like heart is  beating out of chest"   Influenza Vaccines     Swelling and redness at injection site, fever   Nsaids     Pt states she is not taking because of kidney function   Percocet [Oxycodone-Acetaminophen] Nausea And Vomiting   Prednisone     Dizziness, spiked blood sugar    Social History:   Social History   Socioeconomic History   Marital status: Widowed    Spouse name: Not on file   Number of children: Not on file   Years of education: Not on file   Highest education level: Not on file  Occupational History   Not on file  Social Needs   Financial resource strain: Not on file   Food insecurity    Worry: Not on file    Inability: Not on file   Transportation needs    Medical: Not on file    Non-medical: Not on file  Tobacco Use   Smoking status: Former Smoker    Packs/day: 0.75    Years: 60.00    Pack years: 45.00    Quit date: 11/04/2016    Years since quitting: 2.7   Smokeless tobacco: Never Used  Substance and Sexual  Activity   Alcohol use: No   Drug use: No   Sexual activity: Not on file  Lifestyle   Physical activity    Days per week: Not on file    Minutes per session: Not on file   Stress: Not on file  Relationships   Social connections    Talks on phone: Not on file    Gets together: Not on file    Attends religious service: Not on file    Active member of club or organization: Not on file    Attends meetings of clubs or organizations: Not on file    Relationship status: Not on file   Intimate partner violence    Fear of current or ex partner: Not on file    Emotionally abused: Not on file    Physically abused: Not on file    Forced sexual activity: Not on file  Other Topics Concern   Not on file  Social History Narrative   Lives at [facility].    Family History:   Family History  Problem Relation Age of Onset   Heart attack Father    Heart disease Father    Cancer Father    Hypertension Father    Hypertension Brother    Stroke Brother    Hypertension Brother    Diabetes Son    Hyperlipidemia Son    Hypertension Son    Family Status:  Family Status  Relation Name Status   Father  Deceased at age 63   Brother  66   Brother  Alive   Mother  Deceased       suicide   Son  (Not Specified)   MGM  Deceased   MGF  Deceased   PGM  Deceased   PGF  Deceased    ROS:  Please see the history of present illness.  All other ROS reviewed and negative.     Physical Exam/Data:   Vitals:   08/18/19 1845 08/18/19 2233 08/19/19 0500 08/19/19 1034  BP: (!) 175/48 (!) 145/57 135/63 140/72  Pulse: 72  71 71  Resp: 18 (!) 24    Temp:  97.6 F (36.4 C) 98.6 F (37 C)   TempSrc:  Oral Oral   SpO2: 99% 97% 96%  Weight:  67 kg 66.7 kg   Height:  5\' 4"  (1.626 m)     Orthostatic VS for the past 24 hrs:  BP- Lying Pulse- Lying BP- Sitting Pulse- Sitting BP- Standing at 0 minutes Pulse- Standing at 0 minutes  08/19/19 1337 -- -- --  -- 99/54 70  08/19/19 1335 -- -- 123/41 69 -- --  08/19/19 1333 155/53 67 -- -- -- --    Intake/Output Summary (Last 24 hours) at 08/19/2019 1522 Last data filed at 08/19/2019 1035 Gross per 24 hour  Intake 970 ml  Output 600 ml  Net 370 ml   Filed Weights   08/18/19 1335 08/18/19 2233 08/19/19 0500  Weight: 66.8 kg 67 kg 66.7 kg   Wt 10/02 151 lbs, 10/19 149 lbs, 10/27 147 lbs  Body mass index is 25.25 kg/m.  General:  Well nourished, well developed, elderly female in no acute distress HEENT: normal Lymph: no adenopathy Neck: JVD - minimal elevation  Endocrine:  No thryomegaly Vascular: No carotid bruits; 4/4 extremity pulses 2+, without bruits  Cardiac:  normal S1, S2; RRR; no murmur Lungs:  Scattered dry rales bilaterally, no wheezing, rhonchi Abd: soft, nontender, no hepatomegaly  Ext: no edema Musculoskeletal:  No deformities, BUE and BLE strength normal and equal Skin: warm and dry  Neuro:  CNs 2-12 intact, no focal abnormalities noted Psych:  Normal affect   EKG:  The EKG was personally reviewed and demonstrates:  10/27 ECG is AV paced Telemetry:  Telemetry was personally reviewed and demonstrates:  V pacing    CV studies:   PACER CHECK: 08/07/2019 RA:                   44.34 % RV:                   86.39 % LV:                    88.17 % AP-VS:              0.63 % AS-VP:              44.19 % AP-VP:              44.08 % AS-VS:              11.09 %  ECHO: 01/10/2019  1. The left ventricle has a visually estimated ejection fraction of 35%. The cavity size was normal. There is mildly increased left ventricular wall thickness. Left ventricular diastolic Doppler parameters are consistent with impaired relaxation.  Diffuse hypokinesis with septal-lateral dyssynchrony.  2. The right ventricle has normal systolic function. The cavity was normal. There is no increase in right ventricular wall thickness.  3. Left atrial size was mildly dilated.  4. Trivial  pericardial effusion is present.  5. Mild calcification of the mitral valve leaflet. Mitral valve regurgitation is mild to moderate by color flow Doppler. No evidence of mitral valve stenosis.  6. The aortic valve is tricuspid Mild calcification of the aortic valve. Aortic valve regurgitation is mild by color flow Doppler. no stenosis of the aortic valve.  7. The aortic root and ascending aorta are normal in size and structure.  8. Normal IVC size with PA systolic pressure 21 mmHg.  5/19 CPX FVC 1.97 (73%)    FEV1 1.24 (61%)     FEV1/FVC 63 (81%)     MVV 49 (59%)     Resting HR: 58 Peak  HR: 110  (78% age predicted max HR) BP rest: 152/66 BP peak: 214/70 Peak VO2: 9.6 (57% predicted peak VO2) VE/VCO2 slope: 50 OUES: 0.87 Peak RER: 0.93 VE/MVV: 70% O2pulse: 6  (75% predicted O2pulse)   01/2018 PYP scan negative for TTR  RHC 01/2018  RA = 3 RV = 29/3 PA = 25/12 (16) PCW = 8 Fick cardiac output/index = 5.3/3.0 PVR = 1.5 WU Ao sat = 97% PA sat = 65%, 67% Assessment: 1. Normal filling pressures and outputs  LHC 01/2017  Heavily calcified but non-obstructive CAD. 50% RCA and 30% LAD.   ECHO 01/2018  EF 35%  Severe concentric LVH with diffuse hypokinesis and paradoxical septal motion. Thickened aortic and mitral valve leaflets. Mild aortic regurgitation and moderate mitral regurgitation. Trivial posteriorly located pericardial effusion. Evaluation for cardiac amyloidosis should be considered .   Laboratory Data:   Chemistry Recent Labs  Lab 08/18/19 1511  NA 136  K 3.4*  CL 99  CO2 25  GLUCOSE 171*  BUN 33*  CREATININE 2.00*  CALCIUM 9.4  GFRNONAA 23*  GFRAA 26*  ANIONGAP 12    Lab Results  Component Value Date   ALT 17 08/18/2019   AST 18 08/18/2019   ALKPHOS 81 08/18/2019   BILITOT 0.9 08/18/2019   Hematology Recent Labs  Lab 08/18/19 1511 08/19/19 0510  WBC 7.3  --   RBC 3.52*  --   HGB 10.5* 10.0*  HCT 31.4* 31.1*   MCV 89.2  --   MCH 29.8  --   MCHC 33.4  --   RDW 14.0  --   PLT 229  --    Cardiac Enzymes High Sensitivity Troponin:   Recent Labs  Lab 08/18/19 1511 08/18/19 1616  TROPONINIHS 17 19*     BNP Recent Labs  Lab 08/18/19 1511  BNP 348.9*   TSH:  Lab Results  Component Value Date   TSH 2.077 11/05/2016   Lipids:No results found for: CHOL, HDL, LDLCALC, LDLDIRECT, TRIG, CHOLHDL HgbA1c: Lab Results  Component Value Date   HGBA1C 6.9 (H) 06/11/2019   Magnesium:  Magnesium  Date Value Ref Range Status  08/06/2019 2.4 (H) 1.6 - 2.3 mg/dL Final   Lab Results  Component Value Date   IRON 10 (L) 11/05/2016   TIBC 489 (H) 11/05/2016   FERRITIN 8 (L) 11/05/2016    Radiology/Studies:  Dg Chest Portable 1 View  Result Date: 08/18/2019 CLINICAL DATA:  Shortness of breath EXAM: PORTABLE CHEST 1 VIEW COMPARISON:  July 25, 2019 FINDINGS: No edema or consolidation. There is cardiomegaly with pulmonary vascularity normal. There is aortic atherosclerosis. Pacemaker leads are attached to the right atrium, right ventricle, and coronary sinus. There is a calcified aortopulmonary window lymph node, stable. No pneumothorax. No bone lesions. IMPRESSION: No edema or consolidation. Stable cardiomegaly. Stable pacemaker lead placement. Stable calcified lymph node in the aortopulmonary window region consistent with prior granulomatous disease. Aortic Atherosclerosis (ICD10-I70.0). Electronically Signed   By: Lowella Grip III M.D.   On: 08/18/2019 16:11    Assessment and Plan:   1. Acute on Chronic systolic CHF - wt is down and pt does not think she is volume overloaded - DOE is likely from anemia and dehydration - with +orthostatics, will hold the Lasix for a few days - continue to follow closely - BUN/Cr are at baseline.   2. Anemia w/ heme +stools and hx iron deficiency  - eval per GI/IM - will ck iron levels  3. CKD III -  BUN/Cr are at baseline  Otherwise, per  IM Active Problems:   Uncontrolled hypertension   CKD (chronic kidney disease), stage III (HCC)   Diabetes mellitus with complication (HCC)   Chronic systolic CHF (congestive heart failure) (HCC)   Gastrointestinal hemorrhage with melena   UGI bleed   For questions or updates, please contact [facility] Please consult [email or web address] for contact info under Cardiology/STEMI.   Signed, Rosaria Ferries, PA-C  08/19/2019 3:22 PM  History and all data above reviewed.  Patient examined.  I agree with the findings as above.  The patient says she has not felt well since Covid.  She did have an exercise patient not been able to eat appropriately at her retirement home.  However, her more acute symptoms started about 2 or 3 days ago.  She was dizzy.  She was very weak is getting short distance to her bathroom from her bed.  She did not think she was having increased fluid as she did have abdominal distention.  Her ankles not been swollen.  She is not describing new PND or orthopnea.  Her weight is actually down from her base weight.  She is not noticed any new palpitations.  She has had no new chest pressure, neck or arm discomfort.  She did have some black stools.  The patient exam reveals COR: Regular rate and rhythm,  Lungs: Clear to auscultation bilaterally,  Abd: Positive bowel sounds normal in frequency in pitch, Ext, no edema, Neck: No jugular venous distention at 45 degrees.  All available labs, radiology testing, previous records reviewed. Agree with documented assessment and plan.  At this point I do not think that she has any acute exacerbation of her heart failure.  I think she is got chronic systolic and diastolic dysfunction.  In fact given the orthostasis, decreased weight and a questionable ongoing GI bleed I would suggest holding the diuretics for now.  I think she will tolerate the continue beta-blocker and amiodarone however.  Jeneen Rinks Rhea Kaelin  3:36 PM  08/19/2019

## 2019-08-19 NOTE — Progress Notes (Signed)
Initial Nutrition Assessment  INTERVENTION:   Once diet advanced following EGD, recommend Ensure Enlive po BID, each supplement provides 350 kcal and 20 grams of protein  NUTRITION DIAGNOSIS:   Inadequate oral intake related to dysphagia as evidenced by per patient/family report.  GOAL:   Patient will meet greater than or equal to 90% of their needs  MONITOR:   PO intake, Supplement acceptance, Labs, Weight trends, I & O's  REASON FOR ASSESSMENT:   Malnutrition Screening Tool    ASSESSMENT:   83 y.o. female with medical history significant of CHF, s/p PPM placement 3 weeks ago, A. Fib started on Eliquis 1 weeks ago for stroke prevention 2/2 CHA2DS2-VASc score of 6, started 1 week ago on Amiodarone. Last echo 3/12 with EF 35%. She is followed in the a.fib and CHF clinics. She has chronic esophageal stricture and is s/p dilatation x 5 last 10/29/2018.  **RD working remotely**  Patient currently on clear liquids and will be NPO after midnight for EGD w/ possible dilation 10/29.  Pt with history of esophageal stricture, last dilated in January 2020. Pt's dysphagia worsened over the past 1-2 months in whic she has mainly subsisted on mashed potatoes and Ensure supplements. Once diet is advanced following procedure recommend continuing protein supplements.   Per weight records, pt has lost 7 lbs since 9/8 (4% wt loss x 1.5 months, insignificant for time frame).   I/Os: +370 ml since admit UOP: 300 ml x 24 hrs  Medications: Lasix tablet Labs reviewed:  CBGs: 139  NUTRITION - FOCUSED PHYSICAL EXAM:  Unable to perform -working remotely.  Diet Order:   Diet Order            Diet NPO time specified  Diet effective midnight        Diet clear liquid Room service appropriate? Yes; Fluid consistency: Thin  Diet effective now              EDUCATION NEEDS:   No education needs have been identified at this time  Skin:  Skin Assessment: Reviewed RN Assessment  Last BM:   10/27  Height:   Ht Readings from Last 1 Encounters:  08/18/19 5\' 4"  (1.626 m)    Weight:   Wt Readings from Last 1 Encounters:  08/19/19 66.7 kg    Ideal Body Weight:  54.5 kg  BMI:  Body mass index is 25.25 kg/m.  Estimated Nutritional Needs:   Kcal:  1600-1800  Protein:  70-80g  Fluid:  1.8L/day  Clayton Bibles, MS, RD, LDN Inpatient Clinical Dietitian Pager: [phone] After Hours Pager: [phone]

## 2019-08-19 NOTE — Consult Note (Addendum)
ELECTROPHYSIOLOGY CONSULT NOTE    Patient ID: Krista Mason MRN: [id number], DOB/AGE: Jul 10, 1936 83 y.o.  Admit date: 08/18/2019 Date of Consult: 08/19/2019  Primary Physician: Leighton Ruff, MD Primary Cardiologist: Dr. Haroldine Laws Electrophysiologist: Dr. Curt Bears  Referring Provider: Dr. Ree Kida  Patient Profile: Krista Mason is a 83 y.o. female with a history of paroxysmal afib, chronic systolic CHF, LBBB who is being seen today for the evaluation of GI bleed on Eliquis at the request of Dr. Ree Kida.  HPI:  Krista Mason is a 83 y.o. female underwent CRT-P placement earlier this month due to CHF and LBBB. Device interrogation at wound check revealed paroxysmal afib and significant amount of atrial ectopy which was thought to be contributing to her symptoms of SOB. Started on amiodarone, and with discussion in Afib clinic, started on Eliquis 08/10/2019   She called EP office 08/18/2019 with complaints of weakness, dizziness, black stool, and nausea and we recommended prompt PCP follow up. Symptoms did not improve and pt came to ED. Pertinent labs on admission included melena with heme positive stool and Hgb 10g. Admitted for further evaluation and treatment.   She feels somewhat better this am. She is not SOB at rest, but remains fatigued. She feels like her dark stools may have preceded starting Eliquis, but she didn't think of it at the time. She feels like they started about 2 weeks ago. She is pending further evaluation by GI per notes.   Past Medical History:  Diagnosis Date  . Atrial fibrillation ([place])   . Chronic combined systolic and diastolic CHF (congestive heart failure) ([place])    a. LV dysfcuntion felt to be related to PVCs  . Chronic kidney disease   . Diabetes mellitus without complication ([place])   . GERD (gastroesophageal reflux disease)   . Hx of echocardiogram    Echo (1/16):  EF 60-65%, no RWMA, Gr 1 DD, mod AI, mod MR, mild LAE  . Hyperkalemia   .  Hyperlipidemia   . Hypertension   . NICM (nonischemic cardiomyopathy) ([place])    a. 12/2018 Echo: EF 35%, diff HK w/ septal-lateral dyssynchrony. Nl RV fxn; b. 07/2019 s/p MDT [id number] [name] CRT-P MRI SureScan device (ser #: [id number])  . Orthostatic hypotension   . PVC (premature ventricular contraction)    a. s/p PVC ablation in 11/2016  . Rheumatic fever      Surgical History:  Past Surgical History:  Procedure Laterality Date  . BIV PACEMAKER INSERTION CRT-P N/A 07/24/2019   Procedure: BIV PACEMAKER INSERTION CRT-P;  Surgeon: Constance Haw, MD;  Location: [facility];  Service: Cardiovascular;  Laterality: N/A;  . ESOPHAGOGASTRODUODENOSCOPY (EGD) WITH PROPOFOL N/A 09/25/2018   Procedure: ESOPHAGOGASTRODUODENOSCOPY (EGD) WITH PROPOFOL;  Surgeon: Ronald Lobo, MD;  [facility];  Service: Endoscopy;  Laterality: N/A;  . ESOPHAGOGASTRODUODENOSCOPY (EGD) WITH PROPOFOL N/A 10/29/2018   Procedure: ESOPHAGOGASTRODUODENOSCOPY (EGD) WITH PROPOFOL;  Surgeon: Ronald Lobo, MD;  [facility];  Service: Endoscopy;  Laterality: N/A;  . KNEE SURGERY Left 2009  . OVARIAN CYST REMOVAL    . PVC ABLATION N/A 11/30/2016   Procedure: PVC Ablation;  Surgeon: Gurkirat Basher [name], MD;  Location: [facility];  Service: Cardiovascular;  Laterality: N/A;  . RIGHT HEART CATH N/A 01/31/2018   Procedure: RIGHT HEART CATH;  Surgeon: Jolaine Artist, MD;  Location: [facility];  Service: Cardiovascular;  Laterality: N/A;  . RIGHT/LEFT HEART CATH AND CORONARY ANGIOGRAPHY N/A 01/25/2017   Procedure:  Right/Left Heart Cath and Coronary Angiography;  Surgeon: Jolaine Artist, MD;  Location: [facility];  Service: Cardiovascular;  Laterality: N/A;  . SAVORY DILATION N/A 09/25/2018   Procedure: SAVORY DILATION;  Surgeon: Ronald Lobo, MD;  [facility];  Service: Endoscopy;  Laterality: N/A;  . SAVORY DILATION N/A 10/29/2018   Procedure: SAVORY  DILATION;  Surgeon: Ronald Lobo, MD;  [facility];  Service: Endoscopy;  Laterality: N/A;     Medications Prior to Admission  Medication Sig Dispense Refill Last Dose  . amiodarone (PACERONE) 200 MG tablet Take 1 tablet (200 mg total) by mouth daily. 90 tablet 3 08/18/2019 at Unknown time  . apixaban (ELIQUIS) 2.5 MG TABS tablet Take 1 tablet (2.5 mg total) by mouth 2 (two) times daily. 60 tablet 6 08/18/2019 at 0930  . atorvastatin (LIPITOR) 20 MG tablet TAKE 1 TABLET BY MOUTH EVERY DAY (Patient taking differently: Take 20 mg by mouth at bedtime. ) 90 tablet 3 08/17/2019 at Unknown time  . Camphor-Eucalyptus-Menthol (VICKS VAPORUB EX) Apply 1 application topically daily as needed (toe fungus).   unknown  . carvedilol (COREG) 12.5 MG tablet Take 1 tablet (12.5 mg total) by mouth 2 (two) times daily. 180 tablet 3 08/18/2019 at 0930  . Cholecalciferol (VITAMIN D) 50 MCG (2000 UT) tablet Take 2,000 Units by mouth daily.   08/18/2019 at Unknown time  . furosemide (LASIX) 20 MG tablet Take 20 mg by mouth daily.   08/18/2019 at Unknown time  . hydrALAZINE (APRESOLINE) 50 MG tablet Take 1 tablet (50 mg total) by mouth 2 (two) times daily. 180 tablet 3 08/18/2019 at Unknown time  . omeprazole (PRILOSEC) 40 MG capsule Take 40 mg by mouth 2 (two) times daily.    08/18/2019 at Unknown time  . Polyethyl Glycol-Propyl Glycol (SYSTANE OP) Place 1 drop into both eyes 2 (two) times daily as needed (dryness).   Past Month at Unknown time    Inpatient Medications:  . amiodarone  200 mg Oral Daily  . atorvastatin  20 mg Oral QHS  . carvedilol  12.5 mg Oral BID  . furosemide  20 mg Oral Daily  . hydrALAZINE  50 mg Oral BID  . insulin aspart  0-15 Units Subcutaneous TID WC    Allergies:  Allergies  Allergen Reactions  . Penicillins Itching and Rash    Amoxicillin ok- IM pen is what gives reaction  Did it involve swelling of the face/tongue/throat, SOB, or low BP? No Did it involve sudden  or severe rash/hives, skin peeling, or any reaction on the inside of your mouth or nose? No Did you need to seek medical attention at a hospital or doctor's office? No When did it last happen?10 + year If all above answers are "NO", may proceed with cephalosporin use.   [name] [Cefaclor] Itching  . Crestor [Rosuvastatin Calcium] Nausea Only  . Epinephrine Other (See Comments)    "feels like heart is beating out of chest"  . Influenza Vaccines     Swelling and redness at injection site, fever  . Nsaids     Pt states she is not taking because of kidney function  . Percocet [Oxycodone-Acetaminophen] Nausea And Vomiting  . Prednisone     Dizziness, spiked blood sugar    Social History   Socioeconomic History  . Marital status: Widowed    Spouse name: Not on file  . Number of children: Not on file  . Years of education: Not on file  .  Highest education level: Not on file  Occupational History  . Not on file  Social Needs  . Financial resource strain: Not on file  . Food insecurity    Worry: Not on file    Inability: Not on file  . Transportation needs    Medical: Not on file    Non-medical: Not on file  Tobacco Use  . Smoking status: Former Smoker    Packs/day: 0.75    Years: 60.00    Pack years: 45.00    Quit date: 11/04/2016    Years since quitting: 2.7  . Smokeless tobacco: Never Used  Substance and Sexual Activity  . Alcohol use: No  . Drug use: No  . Sexual activity: Not on file  Lifestyle  . Physical activity    Days per week: Not on file    Minutes per session: Not on file  . Stress: Not on file  Relationships  . Social Herbalist on phone: Not on file    Gets together: Not on file    Attends religious service: Not on file    Active member of club or organization: Not on file    Attends meetings of clubs or organizations: Not on file    Relationship status: Not on file  . Intimate partner violence    Fear of current or ex partner: Not  on file    Emotionally abused: Not on file    Physically abused: Not on file    Forced sexual activity: Not on file  Other Topics Concern  . Not on file  Social History Narrative  . Not on file     Family History  Problem Relation Age of Onset  . Heart attack Father   . Heart disease Father   . Cancer Father   . Hypertension Father   . Hypertension Brother   . Stroke Brother   . Hypertension Brother   . Diabetes Son   . Hyperlipidemia Son   . Hypertension Son      Review of Systems: All other systems reviewed and are otherwise negative except as noted above.  Physical Exam: Vitals:   08/18/19 1815 08/18/19 1845 08/18/19 2233 08/19/19 0500  BP: (!) 146/61 (!) 175/48 (!) 145/57 135/63  Pulse: 71 72  71  Resp: (!) 30 18 (!) 24   Temp:   97.6 F (36.4 C) 98.6 F (37 C)  TempSrc:   Oral Oral  SpO2: 97% 99% 97% 96%  Weight:   67 kg 66.7 kg  Height:   5\' 4"  (1.626 m)     GEN- The patient is chronically ill and elderly appearing, alert and oriented x 3 today.   HEENT: normocephalic, atraumatic; sclera clear, conjunctiva pink; hearing intact; oropharynx clear; neck supple Lungs- Clear to ausculation bilaterally, normal work of breathing.  No wheezes, rales, rhonchi Heart- Regular rate and rhythm, no murmurs, rubs or gallops GI- soft, non-tender, non-distended, bowel sounds present Extremities- no clubbing, cyanosis, or edema; DP/PT/radial pulses 2+ bilaterally MS- no significant deformity or atrophy Skin- warm and dry, no rash or lesion Psych- euthymic mood, full affect Neuro- strength and sensation are intact  Labs:   Lab Results  Component Value Date   WBC 7.3 08/18/2019   HGB 10.0 (L) 08/19/2019   HCT 31.1 (L) 08/19/2019   MCV 89.2 08/18/2019   PLT 229 08/18/2019    Recent Labs  Lab 08/18/19 1511  NA 136  K 3.4*  CL 99  CO2 25  BUN 33*  CREATININE 2.00*  CALCIUM 9.4  PROT 6.6  BILITOT 0.9  ALKPHOS 81  ALT 17  AST 18  GLUCOSE 171*       Radiology/Studies: Dg Chest 2 View  Result Date: 07/25/2019 CLINICAL DATA:  Cardiac device placement. EXAM: CHEST - 2 VIEW COMPARISON:  01/28/2018 FINDINGS: Interval placement of dual lead left-sided cardiac pacemaker which appears in adequate position. Lungs are adequately inflated without airspace consolidation, effusion or pneumothorax. Calcified left hilar lymph nodes. Stable borderline cardiomegaly. Stable mild compression deformity over the lower thoracic spine. IMPRESSION: No acute cardiopulmonary disease. Left-sided pacemaker placement.  No pneumothorax. Electronically Signed   By: Marin Olp M.D.   On: 07/25/2019 08:12   Dg Chest Portable 1 View  Result Date: 08/18/2019 CLINICAL DATA:  Shortness of breath EXAM: PORTABLE CHEST 1 VIEW COMPARISON:  July 25, 2019 FINDINGS: No edema or consolidation. There is cardiomegaly with pulmonary vascularity normal. There is aortic atherosclerosis. Pacemaker leads are attached to the right atrium, right ventricle, and coronary sinus. There is a calcified aortopulmonary window lymph node, stable. No pneumothorax. No bone lesions. IMPRESSION: No edema or consolidation. Stable cardiomegaly. Stable pacemaker lead placement. Stable calcified lymph node in the aortopulmonary window region consistent with prior granulomatous disease. Aortic Atherosclerosis (ICD10-I70.0). Electronically Signed   By: Lowella Grip III M.D.   On: 08/18/2019 16:11    EKG: 08/18/2019 A-V dual paced rhythm at 70 bpm (personally reviewed)  TELEMETRY: A/V pacing 70-80s (personally reviewed)  DEVICE HISTORY: MDT CRT-P 07/24/2019 for chronic systolic CHF, LBBB.  Assessment/Plan: 1.  GI bleed Recently started on Eliquis due to paroxysmal Afib and CHA2DS2VASC of at least 6.    Now on hold for GI bleed.  On further history, she feels her dark stools preceded starting Eliquis, but didn't think of it at the time.  Kyre Jeffries follow along pending further GI work up.   2. NICM s/p CRT-P  Echo 01/01/2019 LVEF 35% Volume status looks OK on exam.  Continue to follow.   3. Paroxysmal atrial fibrillation Continue amiodarone Eliquis on hold with GI bleed. CHA2DS2VASC of at least 6, so Collyn Ribas need to weight risks/benefits of continuing on [facility] pending further GI work up.   For questions or updates, please contact [place] Please consult [email or web address] for contact info under Cardiology/STEMI.  Signed, [name], PA-C  08/19/2019 9:25 AM   I have seen and examined this patient with [name].  Agree with above, note added to reflect my findings.  On exam, RRR, no murmurs, faint crackles.  Presented to the hospital with weakness, fatigue, and dark tarry stools.  She was recently put on Eliquis.  There is a plan for endoscopy.  At this point, would continue to hold her Eliquis.  Once cleared by GI, would restart.  Would continue amiodarone.  Appears euvolemic though does have some faint crackles.  Would continue with current management.  Kassie Keng M. Garin Mata MD 08/19/2019 11:35 AM

## 2019-08-19 NOTE — H&P (View-Only) (Signed)
Referring Provider: Dr. Linda Hedges Primary Care Physician:  Leighton Ruff, MD Primary Gastroenterologist:  Dr. Cristina Gong  Reason for Consultation:  Melena  HPI: Krista Mason is a 83 y.o. female with a history of a tight esophageal stricture last dilated to 11 mm in January 2020. Has had intermittent dysphagia since that dilation but reports being able to eat solid food without significant difficulty until 2 months ago. Started having intermittent black stools 2 weeks ago but also had brown stools and then last week everytime she had a BM she saw black stools. Would have a BM every 2-3 days last week and reports being constipated. Two days developed black tarry stools daily. Would have abdominal pain with the urge to defecate and occasionally would have focal left inguinal pain but otherwise did not have any abdominal pain. Felt lightheaded this week and saw Dr. Drema Dallas on a televisit yesterday who advised her to go to the ER due to her black tarry stools, weakness, SOB, and dizziness. Hgb 10.5 (11.4 on 08/06/19). Started on Eliquis 10 days ago. Has not had any black stools here. In regards to her dysphagia has not been able to eat any solid foods for 1-2 months and has been eating mainly mashed potatoes and drinking Ensure. Has belching, hiccups, and regurgitation at times. Denies heartburn. EGD in 12/19 dilated with Savary dilator from 7 mm and 9 mm and repeat EGD in 10/2018 with Savary dilation at 9 mm, 10 mm, and 11 mm. Despite having intermittent dysphagia during the year she did not leave her residence for months due to the pandemic. Reports a colonoscopy over 10 years ago by Dr. Cristina Gong but not found in office [facility] chart.  Past Medical History:  Diagnosis Date  . Atrial fibrillation ([place])   . Chronic combined systolic and diastolic CHF (congestive heart failure) ([place])    a. LV dysfcuntion felt to be related to PVCs  . Chronic kidney disease   . Diabetes mellitus without complication ([place])   . GERD  (gastroesophageal reflux disease)   . Hx of echocardiogram    Echo (1/16):  EF 60-65%, no RWMA, Gr 1 DD, mod AI, mod MR, mild LAE  . Hyperkalemia   . Hyperlipidemia   . Hypertension   . NICM (nonischemic cardiomyopathy) ([place])    a. 12/2018 Echo: EF 35%, diff HK w/ septal-lateral dyssynchrony. Nl RV fxn; b. 07/2019 s/p MDT [id number] [name] CRT-P MRI SureScan device (ser #: [id number])  . Orthostatic hypotension   . PVC (premature ventricular contraction)    a. s/p PVC ablation in 11/2016  . Rheumatic fever     Past Surgical History:  Procedure Laterality Date  . BIV PACEMAKER INSERTION CRT-P N/A 07/24/2019   Procedure: BIV PACEMAKER INSERTION CRT-P;  Surgeon: Constance Haw, MD;  Location: [facility];  Service: Cardiovascular;  Laterality: N/A;  . ESOPHAGOGASTRODUODENOSCOPY (EGD) WITH PROPOFOL N/A 09/25/2018   Procedure: ESOPHAGOGASTRODUODENOSCOPY (EGD) WITH PROPOFOL;  Surgeon: Ronald Lobo, MD;  [facility];  Service: Endoscopy;  Laterality: N/A;  . ESOPHAGOGASTRODUODENOSCOPY (EGD) WITH PROPOFOL N/A 10/29/2018   Procedure: ESOPHAGOGASTRODUODENOSCOPY (EGD) WITH PROPOFOL;  Surgeon: Ronald Lobo, MD;  [facility];  Service: Endoscopy;  Laterality: N/A;  . KNEE SURGERY Left 2009  . OVARIAN CYST REMOVAL    . PVC ABLATION N/A 11/30/2016   Procedure: PVC Ablation;  Surgeon: Will [name], MD;  Location: [facility];  Service: Cardiovascular;  Laterality: N/A;  . RIGHT HEART CATH N/A 01/31/2018  Procedure: RIGHT HEART CATH;  Surgeon: Jolaine Artist, MD;  Location: [facility];  Service: Cardiovascular;  Laterality: N/A;  . RIGHT/LEFT HEART CATH AND CORONARY ANGIOGRAPHY N/A 01/25/2017   Procedure: Right/Left Heart Cath and Coronary Angiography;  Surgeon: Jolaine Artist, MD;  Location: [facility];  Service: Cardiovascular;  Laterality: N/A;  . SAVORY DILATION N/A 09/25/2018   Procedure: SAVORY DILATION;  Surgeon: Ronald Lobo, MD;  [facility];  Service: Endoscopy;  Laterality: N/A;  . SAVORY DILATION N/A 10/29/2018   Procedure: SAVORY DILATION;  Surgeon: Ronald Lobo, MD;  [facility];  Service: Endoscopy;  Laterality: N/A;    Prior to Admission medications   Medication Sig Start Date End Date Taking? Authorizing Provider  amiodarone (PACERONE) 200 MG tablet Take 1 tablet (200 mg total) by mouth daily. 08/07/19  Yes [name], PA-C  apixaban (ELIQUIS) 2.5 MG TABS tablet Take 1 tablet (2.5 mg total) by mouth 2 (two) times daily. 08/10/19  Yes Fenton, Clint R, PA  atorvastatin (LIPITOR) 20 MG tablet TAKE 1 TABLET BY MOUTH EVERY DAY Patient taking differently: Take 20 mg by mouth at bedtime.  01/21/19  Yes Bensimhon, Shaune Pascal, MD  Camphor-Eucalyptus-Menthol (VICKS VAPORUB EX) Apply 1 application topically daily as needed (toe fungus).   Yes [provider]  carvedilol (COREG) 12.5 MG tablet Take 1 tablet (12.5 mg total) by mouth 2 (two) times daily. 07/27/19 10/25/19 Yes Camnitz, Will Hassell Done, MD  Cholecalciferol (VITAMIN D) 50 MCG (2000 UT) tablet Take 2,000 Units by mouth daily.   Yes [provider]  furosemide (LASIX) 20 MG tablet Take 20 mg by mouth daily.   Yes [provider]  hydrALAZINE (APRESOLINE) 50 MG tablet Take 1 tablet (50 mg total) by mouth 2 (two) times daily. 06/11/19  Yes Bensimhon, Shaune Pascal, MD  omeprazole (PRILOSEC) 40 MG capsule Take 40 mg by mouth 2 (two) times daily.    Yes [provider]  Polyethyl Glycol-Propyl Glycol (SYSTANE OP) Place 1 drop into both eyes 2 (two) times daily as needed (dryness).   Yes [provider]    Scheduled Meds: . amiodarone  200 mg Oral Daily  . atorvastatin  20 mg Oral QHS  . carvedilol  12.5 mg Oral BID  . furosemide  20 mg Oral Daily  . hydrALAZINE  50 mg Oral BID  . insulin aspart  0-15 Units Subcutaneous TID WC   Continuous Infusions: . sodium chloride    . pantoprozole  (PROTONIX) infusion 8 mg/hr (08/19/19 0323)   PRN Meds:.acetaminophen **OR** acetaminophen, polyvinyl alcohol, traZODone  Allergies as of 08/18/2019 - Review Complete 08/18/2019  Allergen Reaction Noted  . Penicillins Itching and Rash 12/15/2012  . Ceclor [cefaclor] Itching 02/28/2012  . Crestor [rosuvastatin calcium] Nausea Only 10/23/2016  . Epinephrine Other (See Comments) 04/15/2012  . Influenza vaccines  09/17/2018  . Nsaids  12/15/2012  . Percocet [oxycodone-acetaminophen] Nausea And Vomiting 02/28/2012  . Prednisone  12/15/2012    Family History  Problem Relation Age of Onset  . Heart attack Father   . Heart disease Father   . Cancer Father   . Hypertension Father   . Hypertension Brother   . Stroke Brother   . Hypertension Brother   . Diabetes Son   . Hyperlipidemia Son   . Hypertension Son     Social History   Socioeconomic History  . Marital status: Widowed    Spouse name: Not on file  . Number  of children: Not on file  . Years of education: Not on file  . Highest education level: Not on file  Occupational History  . Retired Marine scientist  Social Needs  . Financial resource strain: Not on file  . Food insecurity    Worry: Not on file    Inability: Not on file  . Transportation needs    Medical: Not on file    Non-medical: Not on file  Tobacco Use  . Smoking status: Former Smoker    Packs/day: 0.75    Years: 60.00    Pack years: 45.00    Quit date: 11/04/2016    Years since quitting: 2.7  . Smokeless tobacco: Never Used  Substance and Sexual Activity  . Alcohol use: No  . Drug use: No  . Sexual activity: Not on file  Lifestyle  . Physical activity    Days per week: Not on file    Minutes per session: Not on file  . Stress: Not on file  Relationships  . Social Herbalist on phone: Not on file    Gets together: Not on file    Attends religious service: Not on file    Active member of club or organization: Not on file    Attends meetings  of clubs or organizations: Not on file    Relationship status: Not on file  . Intimate partner violence    Fear of current or ex partner: Not on file    Emotionally abused: Not on file    Physically abused: Not on file    Forced sexual activity: Not on file  Other Topics Concern  . Not on file  Social History Narrative  . Not on file    Review of Systems: All negative except as stated above in HPI.  Physical Exam: Vital signs: Vitals:   08/18/19 2233 08/19/19 0500  BP: (!) 145/57 135/63  Pulse:  71  Resp: (!) 24   Temp: 97.6 F (36.4 C) 98.6 F (37 C)  SpO2: 97% 96%   Last BM Date: 08/18/19 General:   Alert,  Well-developed, well-nourished, pleasant and cooperative in NAD, elderly Head: normocephalic, atraumatic Eyes: anicteric sclera ENT: oropharynx clear Neck: supple, nontender Lungs:  Clear throughout to auscultation.   No wheezes, crackles, or rhonchi. No acute distress. Heart:  Regular rate and rhythm; no murmurs, clicks, rubs,  or gallops. Abdomen: minimal left inguinal tenderness otherwise nontender, soft, nondistended, +BS  Rectal:  Deferred Ext: no edema  GI:  Lab Results: Recent Labs    08/18/19 1511 08/19/19 0510  WBC 7.3  --   HGB 10.5* 10.0*  HCT 31.4* 31.1*  PLT 229  --    BMET Recent Labs    08/18/19 1511  NA 136  K 3.4*  CL 99  CO2 25  GLUCOSE 171*  BUN 33*  CREATININE 2.00*  CALCIUM 9.4   LFT Recent Labs    08/18/19 1511  PROT 6.6  ALBUMIN 3.5  AST 18  ALT 17  ALKPHOS 81  BILITOT 0.9   PT/INR Recent Labs    08/18/19 1511  LABPROT 17.4*  INR 1.4*     Studies/Results: Dg Chest Portable 1 View  Result Date: 08/18/2019 CLINICAL DATA:  Shortness of breath EXAM: PORTABLE CHEST 1 VIEW COMPARISON:  July 25, 2019 FINDINGS: No edema or consolidation. There is cardiomegaly with pulmonary vascularity normal. There is aortic atherosclerosis. Pacemaker leads are attached to the right atrium, right ventricle, and coronary  sinus. There  is a calcified aortopulmonary window lymph node, stable. No pneumothorax. No bone lesions. IMPRESSION: No edema or consolidation. Stable cardiomegaly. Stable pacemaker lead placement. Stable calcified lymph node in the aortopulmonary window region consistent with prior granulomatous disease. Aortic Atherosclerosis (ICD10-I70.0). Electronically Signed   By: Lowella Grip III M.D.   On: 08/18/2019 16:11    Impression/Plan: 83 yo recently started on Eliquis admitted for GI bleed with melenic stools. Hgb 10. History of a tight esophageal stricture with ongoing intermittent solid-food dysphagia. Repeat EGD needed and since received Eliquis yesterday will do EGD (likely will need the pediatric or ultraslim endoscope) with possible dilation tomorrow morning. Clear liquid diet. NPO p MN. If EGD is unrevealing, then may need an updated colonoscopy prior to resuming Eliquis.    LOS: 1 day   Lear Ng  08/19/2019, 9:29 AM  Questions please call [phone]

## 2019-08-19 NOTE — Progress Notes (Signed)
PROGRESS NOTE    Krista Mason  TDD:[id number] DOB: 12/11/1935 DOA: 08/18/2019 PCP: Leighton Ruff, MD   Brief Narrative:  HPI On on 08/18/2019 by Dr. Revonda Standard is a 83 y.o. female with medical history significant of CHF, s/p PPM placement 3 weeks ago, A. Fib started on Eliquis 1 weeks ago for stroke prevention 2/2 CHA2DS2-VASc score of 6, started 1 week ago on Amiodarone. Last echo 3/12 with EF 35%. She is followed in the a.fib and CHF clinics. She has chronic esophageal stricture and is s/p dilatation x 5 last 10/29/2018. She has no h/o PUD but does take PPI bid. She has chronic SOB for months unimproved after PPM. Over the last 24-48 hours she has felt weak, light-headed with continued, but not worsening, SOB. She has had several episodes of dark black tarry stools. Due to her symptoms she presents to [facility].   Assessment & Plan   UGI Bleed/ Acute or metastatic anemia -Patient states she has had dark stools for some time.  She was started on Eliquis 1 week ago. -Of note, patient has had a history of esophageal stricture and required dilatation.  Patient did have EGD noted in December 2019 as well as January 2020 -FOBT positive -Hemoglobin currently 10, was over 12 in August 2020 -Gastroenterology consulted and appreciated, planning for EGD -Continue to monitor CBC  Chronic systolic and diastolic heart failure -Currently appears to be euvolemic and stable -Last echocardiogram 01/01/2019 showed an EF of 35%, LV Doppler parameters consistent with impaired relaxation -Continue Coreg, Lasix  Paroxysmal atrial fibrillation -CHADSVASC 6 -Recently placed on Eliquis approximately 1 week ago, which is now been held in the setting of GI bleed -Continue amiodarone, coreg  Diabetes mellitus, type II -Appears to be diet controlled as patient is not on any home medications -Last hemoglobin A1c 6.9 on 06/11/2019 -Continue sliding scale with CBG monitoring  Chronic kidney  disease, stage IV -Creatinine currently 2, para stable, continue to monitor BMP  Essential hypertension -Continue Coreg, hydralazine, Lasix  Goals of care/CODE STATUS -Had [place] discussion with patient regarding her CODE STATUS.  Has opted to become DO NOT RESUSCITATE.  DVT Prophylaxis  SCDs  Code Status: DNR  Family Communication: None at bedside  Disposition Plan: Admitted.  Pending gastroenterology and cardiology recommendations.  EGD on 10/29  Consultants Cardiology Gastroenterology  Procedures  None  Antibiotics   Anti-infectives (From admission, onward)   None      Subjective:   Krista Mason seen and examined today.  Patient states that she has occasional shortness of breath, and cannot have more than 15 to 20-minute conversation without having to gasp for air.  She has used her oximeter at home, her oxygen saturations remained in the 90s when she feels short of breath.  She currently denies chest pain, abdominal pain, nausea or vomiting, diarrhea, dizziness or headache.  Objective:   Vitals:   08/18/19 1815 08/18/19 1845 08/18/19 2233 08/19/19 0500  BP: (!) 146/61 (!) 175/48 (!) 145/57 135/63  Pulse: 71 72  71  Resp: (!) 30 18 (!) 24   Temp:   97.6 F (36.4 C) 98.6 F (37 C)  TempSrc:   Oral Oral  SpO2: 97% 99% 97% 96%  Weight:   67 kg 66.7 kg  Height:   5\' 4"  (1.626 m)     Intake/Output Summary (Last 24 hours) at 08/19/2019 0957 Last data filed at 08/19/2019 0949 Gross per 24 hour  Intake 250 ml  Output 600  ml  Net -350 ml   Filed Weights   08/18/19 1335 08/18/19 2233 08/19/19 0500  Weight: 66.8 kg 67 kg 66.7 kg    Exam  General: Well developed, chronically ill-appearing, elderly, NAD  HEENT: NCAT, mucous membranes moist.   Neck: Supple  Cardiovascular: S1 S2 auscultated, no rubs, murmurs or gallops. Regular rate and rhythm.  Respiratory: Clear to auscultation bilaterally with equal chest rise, normal work of breathing  Abdomen: Soft,  nontender, nondistended, + bowel sounds  Extremities: warm dry without cyanosis clubbing or edema  Neuro: AAOx3, nonfocal  Psych: Pleasant, appropriate mood and affect   Data Reviewed: I have personally reviewed following labs and imaging studies  CBC: Recent Labs  Lab 08/18/19 1511 08/19/19 0510  WBC 7.3  --   NEUTROABS 5.6  --   HGB 10.5* 10.0*  HCT 31.4* 31.1*  MCV 89.2  --   PLT 229  --    Basic Metabolic Panel: Recent Labs  Lab 08/18/19 1511  NA 136  K 3.4*  CL 99  CO2 25  GLUCOSE 171*  BUN 33*  CREATININE 2.00*  CALCIUM 9.4   GFR: Estimated Creatinine Clearance: 20.7 mL/min (A) (by C-G formula based on SCr of 2 mg/dL (H)). Liver Function Tests: Recent Labs  Lab 08/18/19 1511  AST 18  ALT 17  ALKPHOS 81  BILITOT 0.9  PROT 6.6  ALBUMIN 3.5   No results for input(s): LIPASE, AMYLASE in the last 168 hours. No results for input(s): AMMONIA in the last 168 hours. Coagulation Profile: Recent Labs  Lab 08/18/19 1511  INR 1.4*   Cardiac Enzymes: No results for input(s): CKTOTAL, CKMB, CKMBINDEX, TROPONINI in the last 168 hours. BNP (last 3 results) No results for input(s): PROBNP in the last 8760 hours. HbA1C: No results for input(s): HGBA1C in the last 72 hours. CBG: Recent Labs  Lab 08/18/19 1504 08/18/19 2357 08/19/19 0800  GLUCAP 163* 128* 139*   Lipid Profile: No results for input(s): CHOL, HDL, LDLCALC, TRIG, CHOLHDL, LDLDIRECT in the last 72 hours. Thyroid Function Tests: No results for input(s): TSH, T4TOTAL, FREET4, T3FREE, THYROIDAB in the last 72 hours. Anemia Panel: No results for input(s): VITAMINB12, FOLATE, FERRITIN, TIBC, IRON, RETICCTPCT in the last 72 hours. Urine analysis:    Component Value Date/Time   COLORURINE STRAW (A) 01/28/2018 1353   APPEARANCEUR CLEAR 01/28/2018 1353   LABSPEC 1.006 01/28/2018 1353   PHURINE 6.0 01/28/2018 1353   GLUCOSEU NEGATIVE 01/28/2018 1353   HGBUR NEGATIVE 01/28/2018 1353    BILIRUBINUR NEGATIVE 01/28/2018 1353   KETONESUR NEGATIVE 01/28/2018 1353   PROTEINUR NEGATIVE 01/28/2018 1353   UROBILINOGEN 0.2 04/15/2012 1451   NITRITE NEGATIVE 01/28/2018 1353   LEUKOCYTESUR NEGATIVE 01/28/2018 1353   Sepsis Labs: @LABRCNTIP (procalcitonin:4,lacticidven:4)  ) Recent Results (from the past 240 hour(s))  SARS CORONAVIRUS 2 (TAT 6-24 HRS) Nasopharyngeal Nasopharyngeal Swab     Status: None   Collection Time: 08/18/19  4:33 PM   Specimen: Nasopharyngeal Swab  Result Value Ref Range Status   SARS Coronavirus 2 NEGATIVE NEGATIVE Final    Comment: (NOTE) SARS-CoV-2 target nucleic acids are NOT DETECTED. The SARS-CoV-2 RNA is generally detectable in upper and lower respiratory specimens during the acute phase of infection. Negative results do not preclude SARS-CoV-2 infection, do not rule out co-infections with other pathogens, and should not be used as the sole basis for treatment or other patient management decisions. Negative results must be combined with clinical observations, patient history, and epidemiological information. The expected  result is Negative. Fact Sheet for Patients: SugarRoll.be Fact Sheet for Healthcare Providers: https://www.woods-mathews.com/ This test is not yet approved or cleared by the [place] FDA and  has been authorized for detection and/or diagnosis of SARS-CoV-2 by FDA under an Emergency Use Authorization (EUA). This EUA will remain  in effect (meaning this test can be used) for the duration of the COVID-19 declaration under Section 56 4(b)(1) of the Act, 21 U.S.C. section 360bbb-3(b)(1), unless the authorization is terminated or revoked sooner. Performed at [facility], [place] [address], [place], [place] [address]       Radiology Studies: Dg Chest Portable 1 View  Result Date: 08/18/2019 CLINICAL DATA:  Shortness of breath EXAM: PORTABLE CHEST 1 VIEW COMPARISON:  July 25, 2019 FINDINGS: No edema or consolidation. There is cardiomegaly with pulmonary vascularity normal. There is aortic atherosclerosis. Pacemaker leads are attached to the right atrium, right ventricle, and coronary sinus. There is a calcified aortopulmonary window lymph node, stable. No pneumothorax. No bone lesions. IMPRESSION: No edema or consolidation. Stable cardiomegaly. Stable pacemaker lead placement. Stable calcified lymph node in the aortopulmonary window region consistent with prior granulomatous disease. Aortic Atherosclerosis (ICD10-I70.0). Electronically Signed   By: Lowella Grip III M.D.   On: 08/18/2019 16:11     Scheduled Meds: . amiodarone  200 mg Oral Daily  . atorvastatin  20 mg Oral QHS  . carvedilol  12.5 mg Oral BID  . furosemide  20 mg Oral Daily  . hydrALAZINE  50 mg Oral BID  . insulin aspart  0-15 Units Subcutaneous TID WC   Continuous Infusions: . sodium chloride    . pantoprozole (PROTONIX) infusion 8 mg/hr (08/19/19 0323)     LOS: 1 day   Time Spent in minutes   45 minutes  Krista Mason D.O. on 08/19/2019 at 9:57 AM  Between 7am to 7pm - Please see pager noted on [email or web address]  After 7pm go to [email or web address]  And look for the night coverage person covering for me after hours  [facility] Office  [phone]

## 2019-08-19 NOTE — Consult Note (Addendum)
Referring Provider: Dr. Linda Hedges Primary Care Physician:  Leighton Ruff, MD Primary Gastroenterologist:  Dr. Cristina Gong  Reason for Consultation:  [place]  HPI: Krista Mason is a 83 y.o. female with a history of a tight esophageal stricture last dilated to 11 mm in January 2020. Has had intermittent dysphagia since that dilation but reports being able to eat solid food without significant difficulty until 2 months ago. Started having intermittent black stools 2 weeks ago but also had brown stools and then last week everytime she had a BM she saw black stools. Would have a BM every 2-3 days last week and reports being constipated. Two days developed black tarry stools daily. Would have abdominal pain with the urge to defecate and occasionally would have focal left inguinal pain but otherwise did not have any abdominal pain. Felt lightheaded this week and saw Dr. Drema Dallas on a televisit yesterday who advised her to go to the ER due to her black tarry stools, weakness, SOB, and dizziness. Hgb 10.5 (11.4 on 08/06/19). Started on Eliquis 10 days ago. Has not had any black stools here. In regards to her dysphagia has not been able to eat any solid foods for 1-2 months and has been eating mainly mashed potatoes and drinking Ensure. Has belching, hiccups, and regurgitation at times. Denies heartburn. EGD in 12/19 dilated with Savary dilator from 7 mm and 9 mm and repeat EGD in 10/2018 with Savary dilation at 9 mm, 10 mm, and 11 mm. Despite having intermittent dysphagia during the year she did not leave her residence for months due to the pandemic. Reports a colonoscopy over 10 years ago by Dr. Cristina Gong but not found in office [facility] chart.  Past Medical History:  Diagnosis Date  . Atrial fibrillation ([place])   . Chronic combined systolic and diastolic CHF (congestive heart failure) ([place])    a. LV dysfcuntion felt to be related to PVCs  . Chronic kidney disease   . Diabetes mellitus without complication ([place])   . GERD  (gastroesophageal reflux disease)   . Hx of echocardiogram    Echo (1/16):  EF 60-65%, no RWMA, Gr 1 DD, mod AI, mod MR, mild LAE  . Hyperkalemia   . Hyperlipidemia   . Hypertension   . NICM (nonischemic cardiomyopathy) ([place])    a. 12/2018 Echo: EF 35%, diff HK w/ septal-lateral dyssynchrony. Nl RV fxn; b. 07/2019 s/p MDT [id number] [name] CRT-P MRI SureScan device (ser #: [id number])  . Orthostatic hypotension   . PVC (premature ventricular contraction)    a. s/p PVC ablation in 11/2016  . Rheumatic fever     Past Surgical History:  Procedure Laterality Date  . BIV PACEMAKER INSERTION CRT-P N/A 07/24/2019   Procedure: BIV PACEMAKER INSERTION CRT-P;  Surgeon: Constance Haw, MD;  Location: [facility];  Service: Cardiovascular;  Laterality: N/A;  . ESOPHAGOGASTRODUODENOSCOPY (EGD) WITH PROPOFOL N/A 09/25/2018   Procedure: ESOPHAGOGASTRODUODENOSCOPY (EGD) WITH PROPOFOL;  Surgeon: Ronald Lobo, MD;  Location: [facility];  Service: Endoscopy;  Laterality: N/A;  . ESOPHAGOGASTRODUODENOSCOPY (EGD) WITH PROPOFOL N/A 10/29/2018   Procedure: ESOPHAGOGASTRODUODENOSCOPY (EGD) WITH PROPOFOL;  Surgeon: Ronald Lobo, MD;  Location: [facility];  Service: Endoscopy;  Laterality: N/A;  . KNEE SURGERY Left 2009  . OVARIAN CYST REMOVAL    . PVC ABLATION N/A 11/30/2016   Procedure: PVC Ablation;  Surgeon: Will [name], MD;  Location: [facility];  Service: Cardiovascular;  Laterality: N/A;  . RIGHT HEART CATH N/A 01/31/2018  Procedure: RIGHT HEART CATH;  Surgeon: Jolaine Artist, MD;  Location: [facility];  Service: Cardiovascular;  Laterality: N/A;  . RIGHT/LEFT HEART CATH AND CORONARY ANGIOGRAPHY N/A 01/25/2017   Procedure: Right/Left Heart Cath and Coronary Angiography;  Surgeon: Jolaine Artist, MD;  Location: [facility];  Service: Cardiovascular;  Laterality: N/A;  . SAVORY DILATION N/A 09/25/2018   Procedure: SAVORY DILATION;  Surgeon: Ronald Lobo, MD;  Location: [facility];  Service: Endoscopy;  Laterality: N/A;  . SAVORY DILATION N/A 10/29/2018   Procedure: SAVORY DILATION;  Surgeon: Ronald Lobo, MD;  Location: [facility];  Service: Endoscopy;  Laterality: N/A;    Prior to Admission medications   Medication Sig Start Date End Date Taking? Authorizing Provider  amiodarone (PACERONE) 200 MG tablet Take 1 tablet (200 mg total) by mouth daily. 08/07/19  Yes [name], PA-C  apixaban (ELIQUIS) 2.5 MG TABS tablet Take 1 tablet (2.5 mg total) by mouth 2 (two) times daily. 08/10/19  Yes Fenton, Clint R, PA  atorvastatin (LIPITOR) 20 MG tablet TAKE 1 TABLET BY MOUTH EVERY DAY Patient taking differently: Take 20 mg by mouth at bedtime.  01/21/19  Yes Bensimhon, Shaune Pascal, MD  Camphor-Eucalyptus-Menthol (VICKS VAPORUB EX) Apply 1 application topically daily as needed (toe fungus).   Yes [provider]  carvedilol (COREG) 12.5 MG tablet Take 1 tablet (12.5 mg total) by mouth 2 (two) times daily. 07/27/19 10/25/19 Yes Camnitz, Will Hassell Done, MD  Cholecalciferol (VITAMIN D) 50 MCG (2000 UT) tablet Take 2,000 Units by mouth daily.   Yes [provider]  furosemide (LASIX) 20 MG tablet Take 20 mg by mouth daily.   Yes [provider]  hydrALAZINE (APRESOLINE) 50 MG tablet Take 1 tablet (50 mg total) by mouth 2 (two) times daily. 06/11/19  Yes Bensimhon, Shaune Pascal, MD  omeprazole (PRILOSEC) 40 MG capsule Take 40 mg by mouth 2 (two) times daily.    Yes [provider]  Polyethyl Glycol-Propyl Glycol (SYSTANE OP) Place 1 drop into both eyes 2 (two) times daily as needed (dryness).   Yes [provider]    Scheduled Meds: . amiodarone  200 mg Oral Daily  . atorvastatin  20 mg Oral QHS  . carvedilol  12.5 mg Oral BID  . furosemide  20 mg Oral Daily  . hydrALAZINE  50 mg Oral BID  . insulin aspart  0-15 Units Subcutaneous TID WC   Continuous Infusions: . sodium chloride    . pantoprozole  (PROTONIX) infusion 8 mg/hr (08/19/19 0323)   PRN Meds:.acetaminophen **OR** acetaminophen, polyvinyl alcohol, traZODone  Allergies as of 08/18/2019 - Review Complete 08/18/2019  Allergen Reaction Noted  . Penicillins Itching and Rash 12/15/2012  . Ceclor [cefaclor] Itching 02/28/2012  . Crestor [rosuvastatin calcium] Nausea Only 10/23/2016  . Epinephrine Other (See Comments) 04/15/2012  . Influenza vaccines  09/17/2018  . Nsaids  12/15/2012  . Percocet [oxycodone-acetaminophen] Nausea And Vomiting 02/28/2012  . Prednisone  12/15/2012    Family History  Problem Relation Age of Onset  . Heart attack Father   . Heart disease Father   . Cancer Father   . Hypertension Father   . Hypertension Brother   . Stroke Brother   . Hypertension Brother   . Diabetes Son   . Hyperlipidemia Son   . Hypertension Son     Social History   Socioeconomic History  . Marital status: Widowed    Spouse name: Not on file  . Number  of children: Not on file  . Years of education: Not on file  . Highest education level: Not on file  Occupational History  . Retired Marine scientist  Social Needs  . Financial resource strain: Not on file  . Food insecurity    Worry: Not on file    Inability: Not on file  . Transportation needs    Medical: Not on file    Non-medical: Not on file  Tobacco Use  . Smoking status: Former Smoker    Packs/day: 0.75    Years: 60.00    Pack years: 45.00    Quit date: 11/04/2016    Years since quitting: 2.7  . Smokeless tobacco: Never Used  Substance and Sexual Activity  . Alcohol use: No  . Drug use: No  . Sexual activity: Not on file  Lifestyle  . Physical activity    Days per week: Not on file    Minutes per session: Not on file  . Stress: Not on file  Relationships  . Social Herbalist on phone: Not on file    Gets together: Not on file    Attends religious service: Not on file    Active member of club or organization: Not on file    Attends meetings  of clubs or organizations: Not on file    Relationship status: Not on file  . Intimate partner violence    Fear of current or ex partner: Not on file    Emotionally abused: Not on file    Physically abused: Not on file    Forced sexual activity: Not on file  Other Topics Concern  . Not on file  Social History Narrative  . Not on file    Review of Systems: All negative except as stated above in HPI.  Physical Exam: Vital signs: Vitals:   08/18/19 2233 08/19/19 0500  BP: (!) 145/57 135/63  Pulse:  71  Resp: (!) 24   Temp: 97.6 F (36.4 C) 98.6 F (37 C)  SpO2: 97% 96%   Last BM Date: 08/18/19 General:   Alert,  Well-developed, well-nourished, pleasant and cooperative in NAD, elderly Head: normocephalic, atraumatic Eyes: anicteric sclera ENT: oropharynx clear Neck: supple, nontender Lungs:  Clear throughout to auscultation.   No wheezes, crackles, or rhonchi. No acute distress. Heart:  Regular rate and rhythm; no murmurs, clicks, rubs,  or gallops. Abdomen: minimal left inguinal tenderness otherwise nontender, soft, nondistended, +BS  Rectal:  Deferred Ext: no edema  GI:  Lab Results: Recent Labs    08/18/19 1511 08/19/19 0510  WBC 7.3  --   HGB 10.5* 10.0*  HCT 31.4* 31.1*  PLT 229  --    BMET Recent Labs    08/18/19 1511  NA 136  K 3.4*  CL 99  CO2 25  GLUCOSE 171*  BUN 33*  CREATININE 2.00*  CALCIUM 9.4   LFT Recent Labs    08/18/19 1511  PROT 6.6  ALBUMIN 3.5  AST 18  ALT 17  ALKPHOS 81  BILITOT 0.9   PT/INR Recent Labs    08/18/19 1511  LABPROT 17.4*  INR 1.4*     Studies/Results: Dg Chest Portable 1 View  Result Date: 08/18/2019 CLINICAL DATA:  Shortness of breath EXAM: PORTABLE CHEST 1 VIEW COMPARISON:  July 25, 2019 FINDINGS: No edema or consolidation. There is cardiomegaly with pulmonary vascularity normal. There is aortic atherosclerosis. Pacemaker leads are attached to the right atrium, right ventricle, and coronary  sinus. There  is a calcified aortopulmonary window lymph node, stable. No pneumothorax. No bone lesions. IMPRESSION: No edema or consolidation. Stable cardiomegaly. Stable pacemaker lead placement. Stable calcified lymph node in the aortopulmonary window region consistent with prior granulomatous disease. Aortic Atherosclerosis (ICD10-I70.0). Electronically Signed   By: Lowella Grip III M.D.   On: 08/18/2019 16:11    Impression/Plan: 83 yo recently started on Eliquis admitted for GI bleed with melenic stools. Hgb 10. History of a tight esophageal stricture with ongoing intermittent solid-food dysphagia. Repeat EGD needed and since received Eliquis yesterday will do EGD (likely will need the pediatric or ultraslim endoscope) with possible dilation tomorrow morning. Clear liquid diet. NPO p MN. If EGD is unrevealing, then may need an updated colonoscopy prior to resuming Eliquis.    LOS: 1 day   Lear Ng  08/19/2019, 9:29 AM  Questions please call [phone]

## 2019-08-20 ENCOUNTER — Encounter (HOSPITAL_COMMUNITY)
Admission: EM | Disposition: A | Discharge status: Home Health | Payer: Self-pay | Source: Home / Self Care | Attending: Internal Medicine

## 2019-08-20 ENCOUNTER — Encounter (HOSPITAL_COMMUNITY): Payer: Self-pay | Admitting: *Deleted

## 2019-08-20 ENCOUNTER — Inpatient Hospital Stay (HOSPITAL_COMMUNITY): Payer: Medicare HMO | Admitting: Anesthesiology

## 2019-08-20 DIAGNOSIS — K922 Gastrointestinal hemorrhage, unspecified: Secondary | ICD-10-CM | POA: Diagnosis not present

## 2019-08-20 HISTORY — PX: ESOPHAGOGASTRODUODENOSCOPY (EGD) WITH PROPOFOL: SHX5813

## 2019-08-20 HISTORY — PX: BIOPSY: SHX5522

## 2019-08-20 HISTORY — PX: SAVORY DILATION: SHX5439

## 2019-08-20 LAB — CBC
HCT: 27.8 % — ABNORMAL LOW (ref 36.0–46.0)
Hemoglobin: 9.1 g/dL — ABNORMAL LOW (ref 12.0–15.0)
MCH: 29.4 pg (ref 26.0–34.0)
MCHC: 32.7 g/dL (ref 30.0–36.0)
MCV: 90 fL (ref 80.0–100.0)
Platelets: 199 10*3/uL (ref 150–400)
RBC: 3.09 MIL/uL — ABNORMAL LOW (ref 3.87–5.11)
RDW: 14.4 % (ref 11.5–15.5)
WBC: 6.1 10*3/uL (ref 4.0–10.5)
nRBC: 0 % (ref 0.0–0.2)

## 2019-08-20 LAB — GLUCOSE, CAPILLARY
Glucose-Capillary: 137 mg/dL — ABNORMAL HIGH (ref 70–99)
Glucose-Capillary: 110 mg/dL — ABNORMAL HIGH (ref 70–99)
Glucose-Capillary: 162 mg/dL — ABNORMAL HIGH (ref 70–99)
Glucose-Capillary: 117 mg/dL — ABNORMAL HIGH (ref 70–99)

## 2019-08-20 LAB — BASIC METABOLIC PANEL
Anion gap: 8 (ref 5–15)
BUN: 26 mg/dL — ABNORMAL HIGH (ref 8–23)
CO2: 24 mmol/L (ref 22–32)
Calcium: 9 mg/dL (ref 8.9–10.3)
Chloride: 105 mmol/L (ref 98–111)
Creatinine, Ser: 2.11 mg/dL — ABNORMAL HIGH (ref 0.44–1.00)
GFR calc Af Amer: 25 mL/min — ABNORMAL LOW (ref >60)
GFR calc non Af Amer: 21 mL/min — ABNORMAL LOW (ref >60)
Glucose, Bld: 118 mg/dL — ABNORMAL HIGH (ref 70–99)
Potassium: 3.8 mmol/L (ref 3.5–5.1)
Sodium: 137 mmol/L (ref 135–145)

## 2019-08-20 SURGERY — ESOPHAGOGASTRODUODENOSCOPY (EGD) WITH PROPOFOL
Anesthesia: Monitor Anesthesia Care

## 2019-08-20 MED ORDER — PROPOFOL 500 MG/50ML IV EMUL
INTRAVENOUS | Status: DC | PRN
  Administered 2019-08-20: 100 ug/kg/min via INTRAVENOUS

## 2019-08-20 MED ORDER — PHENYLEPHRINE 40 MCG/ML (10ML) SYRINGE FOR IV PUSH (FOR BLOOD PRESSURE SUPPORT)
PREFILLED_SYRINGE | INTRAVENOUS | Status: DC | PRN
  Administered 2019-08-20 (×2): 120 ug via INTRAVENOUS

## 2019-08-20 MED ORDER — ENSURE ENLIVE PO LIQD
237.0000 mL | Freq: Two times a day (BID) | ORAL | Status: DC
  Administered 2019-08-21 – 2019-08-25 (×5): 237 mL via ORAL

## 2019-08-20 MED ORDER — PANTOPRAZOLE SODIUM 40 MG IV SOLR
40.0000 mg | Freq: Two times a day (BID) | INTRAVENOUS | Status: DC
  Administered 2019-08-20 – 2019-08-25 (×11): 40 mg via INTRAVENOUS
  Filled 2019-08-20 (×11): qty 40

## 2019-08-20 MED ORDER — LIDOCAINE 2% (20 MG/ML) 5 ML SYRINGE
INTRAMUSCULAR | Status: DC | PRN
  Administered 2019-08-20: 70 mg via INTRAVENOUS

## 2019-08-20 MED ORDER — PROPOFOL 10 MG/ML IV BOLUS
INTRAVENOUS | Status: DC | PRN
  Administered 2019-08-20: 30 mg via INTRAVENOUS
  Administered 2019-08-20: 20 mg via INTRAVENOUS

## 2019-08-20 MED ORDER — SODIUM CHLORIDE 0.9 % IV SOLN
INTRAVENOUS | Status: DC
  Administered 2019-08-20: 10:00:00 via INTRAVENOUS

## 2019-08-20 SURGICAL SUPPLY — 15 items

## 2019-08-20 NOTE — Progress Notes (Signed)
PROGRESS NOTE    Krista Mason  VZC:[id number] DOB: 06-10-1936 DOA: 08/18/2019 PCP: Leighton Ruff, MD   Brief Narrative:  HPI On on 08/18/2019 by Dr. Revonda Standard is a 83 y.o. female with medical history significant of CHF, s/p PPM placement 3 weeks ago, A. Fib started on Eliquis 1 weeks ago for stroke prevention 2/2 CHA2DS2-VASc score of 6, started 1 week ago on Amiodarone. Last echo 3/12 with EF 35%. She is followed in the a.fib and CHF clinics. She has chronic esophageal stricture and is s/p dilatation x 5 last 10/29/2018. She has no h/o PUD but does take PPI bid. She has chronic SOB for months unimproved after PPM. Over the last 24-48 hours she has felt weak, light-headed with continued, but not worsening, SOB. She has had several episodes of dark black tarry stools. Due to her symptoms she presents to [facility].   Interim history Admitted with GI bleed. Gastroenterology consulted and planning for EGD today. Cardiology also consulted for CHF, SOB.  Assessment & Plan   UGI Bleed/ Acute or metastatic anemia -Patient states she has had dark stools for some time.  She was started on Eliquis 1 week ago. -Of note, patient has had a history of esophageal stricture and required dilatation.  Patient did have EGD noted in December 2019 as well as January 2020 -FOBT positive -Hemoglobin currently 9.1, was over 12 in August 2020 -Currently protonix drip -Gastroenterology consulted and appreciated, planning for EGD today -Continue to monitor CBC  Chronic systolic and diastolic heart failure -Currently appears to be euvolemic and stable -Last echocardiogram 01/01/2019 showed an EF of 35%, LV Doppler parameters consistent with impaired relaxation -Continue Coreg. Lasix held  Paroxysmal atrial fibrillation -CHADSVASC 6 -Recently placed on Eliquis approximately 1 week ago, which is now been held in the setting of GI bleed -Continue amiodarone, coreg  Diabetes mellitus, type II  -Appears to be diet controlled as patient is not on any home medications -Last hemoglobin A1c 6.9 on 06/11/2019 -Continue sliding scale with CBG monitoring  Chronic kidney disease, stage IV -Creatinine currently 2.11, stable, continue to monitor BMP  Essential hypertension -Continue Coreg, hydralazine, Lasix  Goals of care/CODE STATUS -Had [facility] discussion with patient regarding her CODE STATUS.  Has opted to become DO NOT RESUSCITATE.  DVT Prophylaxis  SCDs  Code Status: DNR  Family Communication: None at bedside  Disposition Plan: Admitted.  Pending gastroenterology and cardiology recommendations.  EGD today 10/29  Consultants Cardiology Gastroenterology  Procedures  None  Antibiotics   Anti-infectives (From admission, onward)   None      Subjective:   Krista Mason seen and examined today.  Patient states that she had a better night and did not have further shortness of breath however has not been very active.  Denies current chest pain, nausea or vomiting, diarrhea, dizziness or headache.  States she is having some abdominal cramps and feels that she may need to have a bowel movement. Objective:   Vitals:   08/19/19 0500 08/19/19 1034 08/19/19 2056 08/20/19 0445  BP: 135/63 140/72 (!) 152/61 (!) 146/51  Pulse: 71 71 68 70  Resp:      Temp: 98.6 F (37 C)  98 F (36.7 C) 99 F (37.2 C)  TempSrc: Oral  Oral Oral  SpO2: 96%  97% 97%  Weight: 66.7 kg   66.7 kg  Height:        Intake/Output Summary (Last 24 hours) at 08/20/2019 1287 Last data filed at  08/20/2019 0452 Gross per 24 hour  Intake 1080 ml  Output 1375 ml  Net -295 ml   Filed Weights   08/18/19 2233 08/19/19 0500 08/20/19 0445  Weight: 67 kg 66.7 kg 66.7 kg   Exam  General: Well developed, chronically ill-appearing, elderly, NAD  HEENT: NCAT, mucous membranes moist.   Neck: Supple, no JVD, no masses  Cardiovascular: S1 S2 auscultated, RRR, no murmur  Respiratory: Clear to auscultation  bilaterally, normal WOB  Abdomen: Soft, nontender, nondistended, + bowel sounds  Extremities: warm dry without cyanosis clubbing or edema  Neuro: AAOx3, nonfocal  Psych: Pleasant, appropriate mood and affect   Data Reviewed: I have personally reviewed following labs and imaging studies  CBC: Recent Labs  Lab 08/18/19 1511 08/19/19 0510 08/19/19 1558 08/20/19 0417  WBC 7.3  --   --  6.1  NEUTROABS 5.6  --   --   --   HGB 10.5* 10.0* 9.6* 9.1*  HCT 31.4* 31.1* 30.2* 27.8*  MCV 89.2  --   --  90.0  PLT 229  --   --  546   Basic Metabolic Panel: Recent Labs  Lab 08/18/19 1511 08/20/19 0417  NA 136 137  K 3.4* 3.8  CL 99 105  CO2 25 24  GLUCOSE 171* 118*  BUN 33* 26*  CREATININE 2.00* 2.11*  CALCIUM 9.4 9.0   GFR: Estimated Creatinine Clearance: 19.6 mL/min (A) (by C-G formula based on SCr of 2.11 mg/dL (H)). Liver Function Tests: Recent Labs  Lab 08/18/19 1511  AST 18  ALT 17  ALKPHOS 81  BILITOT 0.9  PROT 6.6  ALBUMIN 3.5   No results for input(s): LIPASE, AMYLASE in the last 168 hours. No results for input(s): AMMONIA in the last 168 hours. Coagulation Profile: Recent Labs  Lab 08/18/19 1511  INR 1.4*   Cardiac Enzymes: No results for input(s): CKTOTAL, CKMB, CKMBINDEX, TROPONINI in the last 168 hours. BNP (last 3 results) No results for input(s): PROBNP in the last 8760 hours. HbA1C: No results for input(s): HGBA1C in the last 72 hours. CBG: Recent Labs  Lab 08/19/19 0800 08/19/19 1121 08/19/19 1627 08/19/19 2113 08/20/19 0802  GLUCAP 139* 139* 133* 122* 137*   Lipid Profile: No results for input(s): CHOL, HDL, LDLCALC, TRIG, CHOLHDL, LDLDIRECT in the last 72 hours. Thyroid Function Tests: No results for input(s): TSH, T4TOTAL, FREET4, T3FREE, THYROIDAB in the last 72 hours. Anemia Panel: No results for input(s): VITAMINB12, FOLATE, FERRITIN, TIBC, IRON, RETICCTPCT in the last 72 hours. Urine analysis:    Component Value Date/Time    COLORURINE STRAW (A) 01/28/2018 1353   APPEARANCEUR CLEAR 01/28/2018 1353   LABSPEC 1.006 01/28/2018 1353   PHURINE 6.0 01/28/2018 1353   GLUCOSEU NEGATIVE 01/28/2018 1353   HGBUR NEGATIVE 01/28/2018 1353   BILIRUBINUR NEGATIVE 01/28/2018 1353   KETONESUR NEGATIVE 01/28/2018 1353   PROTEINUR NEGATIVE 01/28/2018 1353   UROBILINOGEN 0.2 04/15/2012 1451   NITRITE NEGATIVE 01/28/2018 1353   LEUKOCYTESUR NEGATIVE 01/28/2018 1353   Sepsis Labs: @LABRCNTIP (procalcitonin:4,lacticidven:4)  ) Recent Results (from the past 240 hour(s))  SARS CORONAVIRUS 2 (TAT 6-24 HRS) Nasopharyngeal Nasopharyngeal Swab     Status: None   Collection Time: 08/18/19  4:33 PM   Specimen: Nasopharyngeal Swab  Result Value Ref Range Status   SARS Coronavirus 2 NEGATIVE NEGATIVE Final    Comment: (NOTE) SARS-CoV-2 target nucleic acids are NOT DETECTED. The SARS-CoV-2 RNA is generally detectable in upper and lower respiratory specimens during the acute phase of infection.  Negative results do not preclude SARS-CoV-2 infection, do not rule out co-infections with other pathogens, and should not be used as the sole basis for treatment or other patient management decisions. Negative results must be combined with clinical observations, patient history, and epidemiological information. The expected result is Negative. Fact Sheet for Patients: SugarRoll.be Fact Sheet for Healthcare Providers: https://www.woods-mathews.com/ This test is not yet approved or cleared by the [place] FDA and  has been authorized for detection and/or diagnosis of SARS-CoV-2 by FDA under an Emergency Use Authorization (EUA). This EUA will remain  in effect (meaning this test can be used) for the duration of the COVID-19 declaration under Section 56 4(b)(1) of the Act, 21 U.S.C. section 360bbb-3(b)(1), unless the authorization is terminated or revoked sooner. Performed at [facility], [place] [address], [place], [place] [address]       Radiology Studies: Dg Chest Portable 1 View  Result Date: 08/18/2019 CLINICAL DATA:  Shortness of breath EXAM: PORTABLE CHEST 1 VIEW COMPARISON:  July 25, 2019 FINDINGS: No edema or consolidation. There is cardiomegaly with pulmonary vascularity normal. There is aortic atherosclerosis. Pacemaker leads are attached to the right atrium, right ventricle, and coronary sinus. There is a calcified aortopulmonary window lymph node, stable. No pneumothorax. No bone lesions. IMPRESSION: No edema or consolidation. Stable cardiomegaly. Stable pacemaker lead placement. Stable calcified lymph node in the aortopulmonary window region consistent with prior granulomatous disease. Aortic Atherosclerosis (ICD10-I70.0). Electronically Signed   By: Lowella Grip III M.D.   On: 08/18/2019 16:11     Scheduled Meds: . amiodarone  200 mg Oral Daily  . atorvastatin  20 mg Oral QHS  . carvedilol  12.5 mg Oral BID  . hydrALAZINE  50 mg Oral BID  . insulin aspart  0-15 Units Subcutaneous TID WC   Continuous Infusions: . pantoprozole (PROTONIX) infusion 8 mg/hr (08/19/19 2339)     LOS: 2 days   Time Spent in minutes   30 minutes  Jairen Goldfarb D.O. on 08/20/2019 at 8:39 AM  Between 7am to 7pm - Please see pager noted on [email or web address]  After 7pm go to [email or web address]  And look for the night coverage person covering for me after hours  [facility] Office  [phone]

## 2019-08-20 NOTE — Anesthesia Postprocedure Evaluation (Signed)
Anesthesia Post Note  Patient: Eiman C Vidales  Procedure(s) Performed: ESOPHAGOGASTRODUODENOSCOPY (EGD) WITH PROPOFOL (N/A ) SAVORY DILATION (N/A ) BIOPSY     Patient location during evaluation: PACU Anesthesia Type: MAC Level of consciousness: awake Pain management: pain level controlled Vital Signs Assessment: post-procedure vital signs reviewed and stable Respiratory status: spontaneous breathing, nonlabored ventilation, respiratory function stable and patient connected to nasal cannula oxygen Cardiovascular status: stable and blood pressure returned to baseline Postop Assessment: no apparent nausea or vomiting Anesthetic complications: no    Last Vitals:  Vitals:   08/20/19 1354 08/20/19 2028  BP: (!) 109/46 (!) 138/52  Pulse: 72 67  Resp: 16   Temp: 36.7 C 37.1 C  SpO2: 98% 96%    Last Pain:  Vitals:   08/20/19 2028  TempSrc: Oral  PainSc:                  Karyl Kinnier Docia Klar

## 2019-08-20 NOTE — Progress Notes (Addendum)
Electrophysiology Rounding Note  Patient Name: Krista Mason Date of Encounter: 08/20/2019  Primary Cardiologist: Dr. Haroldine Laws  Electrophysiologist: Dr. Curt Bears   Subjective   The patient is doing well overall this am. Not SOB at rest. Pending EGD later this am.   Inpatient Medications    Scheduled Meds:  amiodarone  200 mg Oral Daily   atorvastatin  20 mg Oral QHS   carvedilol  12.5 mg Oral BID   feeding supplement (ENSURE ENLIVE)  237 mL Oral BID BM   hydrALAZINE  50 mg Oral BID   insulin aspart  0-15 Units Subcutaneous TID WC   pantoprazole (PROTONIX) IV  40 mg Intravenous Q12H   Continuous Infusions:  PRN Meds: acetaminophen **OR** acetaminophen, polyvinyl alcohol, traZODone   Vital Signs    Vitals:   08/20/19 0445 08/20/19 0955 08/20/19 1100 08/20/19 1110  BP: (!) 146/51 (!) 164/61 (!) 126/41 (!) 127/42  Pulse: 70 74 70 70  Resp:  18 14 14   Temp: 99 F (37.2 C) (!) 97.4 F (36.3 C) 97.8 F (36.6 C)   TempSrc: Oral Temporal Oral   SpO2: 97% 100% 100% 100%  Weight: 66.7 kg     Height:        Intake/Output Summary (Last 24 hours) at 08/20/2019 1138 Last data filed at 08/20/2019 1058 Gross per 24 hour  Intake 660 ml  Output 1275 ml  Net -615 ml   Filed Weights   08/18/19 2233 08/19/19 0500 08/20/19 0445  Weight: 67 kg 66.7 kg 66.7 kg    Physical Exam    GEN- The patient is elderly appearing, alert and oriented x 3 today.   Head- normocephalic, atraumatic Eyes-  Sclera clear, conjunctiva pink Ears- hearing intact Oropharynx- clear Neck- supple Lungs- Clear to ausculation bilaterally, normal work of breathing Heart- Regular rate and rhythm, no murmurs, rubs or gallops GI- soft, NT, ND, + BS Extremities- no clubbing, cyanosis, or edema Skin- no rash or lesion Psych- euthymic mood, full affect Neuro- strength and sensation are intact  Labs    CBC Recent Labs    08/18/19 1511  08/19/19 1558 08/20/19 0417  WBC 7.3  --   --  6.1    NEUTROABS 5.6  --   --   --   HGB 10.5*   < > 9.6* 9.1*  HCT 31.4*   < > 30.2* 27.8*  MCV 89.2  --   --  90.0  PLT 229  --   --  199   < > = values in this interval not displayed.   Basic Metabolic Panel Recent Labs    08/18/19 1511 08/20/19 0417  NA 136 137  K 3.4* 3.8  CL 99 105  CO2 25 24  GLUCOSE 171* 118*  BUN 33* 26*  CREATININE 2.00* 2.11*  CALCIUM 9.4 9.0   Liver Function Tests Recent Labs    08/18/19 1511  AST 18  ALT 17  ALKPHOS 81  BILITOT 0.9  PROT 6.6  ALBUMIN 3.5   No results for input(s): LIPASE, AMYLASE in the last 72 hours. Cardiac Enzymes No results for input(s): CKTOTAL, CKMB, CKMBINDEX, TROPONINI in the last 72 hours. BNP Invalid input(s): POCBNP D-Dimer No results for input(s): DDIMER in the last 72 hours. Hemoglobin A1C No results for input(s): HGBA1C in the last 72 hours. Fasting Lipid Panel No results for input(s): CHOL, HDL, LDLCALC, TRIG, CHOLHDL, LDLDIRECT in the last 72 hours. Thyroid Function Tests No results for input(s): TSH, T4TOTAL, T3FREE, THYROIDAB  in the last 72 hours.  Invalid input(s): FREET3  Telemetry    A/V paced in 70-80s (personally reviewed)  Radiology    Dg Chest Portable 1 View  Result Date: 08/18/2019 CLINICAL DATA:  Shortness of breath EXAM: PORTABLE CHEST 1 VIEW COMPARISON:  July 25, 2019 FINDINGS: No edema or consolidation. There is cardiomegaly with pulmonary vascularity normal. There is aortic atherosclerosis. Pacemaker leads are attached to the right atrium, right ventricle, and coronary sinus. There is a calcified aortopulmonary window lymph node, stable. No pneumothorax. No bone lesions. IMPRESSION: No edema or consolidation. Stable cardiomegaly. Stable pacemaker lead placement. Stable calcified lymph node in the aortopulmonary window region consistent with prior granulomatous disease. Aortic Atherosclerosis (ICD10-I70.0). Electronically Signed   By: Lowella Grip III M.D.   On: 08/18/2019 16:11      Patient Profile     Krista Mason is a 83 y.o. female with a history of paroxysmal afib, chronic systolic CHF, LBBB who is being seen today for the evaluation of GI bleed on Eliquis at the request of Dr. Ree Kida.  Assessment & Plan    1. GI bleed With recent Eliquis start, though dark stools preceded.  Pending EGD this am.   2. NICM s/p CRT-P Echo 01/01/2019 LVEF 35% Volume status stable to dry with bleeding. Follow.  Creatinine relatively stable  3. Paroxysmal afib Continue amiodarone Luiscarlos Kaczmarczyk make further decision on [place] pending EGD.  CHA2DS2VASC of at least 6   ADDENDUM: EGD 08/20/2019 by Dr. Alessandra Bevels - LA grade D esophagitis. Biopsied - Benign-appearing esophageal stenosis. Dilated - No gross lesions in the stomach - Normal dueodenal bulb, first and second portions of duodenum Recommendations: Soft diet Resume Eliquis at prior dose tomorrow, if no further bleeding and Hgb stable.   Discussed results above personally with Dr. Alessandra Bevels. Ok to monitor for further bleeding, and resume Eliquis tomorrow at previous dose if stable.   For questions or updates, please contact [place] Please consult [email or web address] for contact info under Cardiology/STEMI.  Signed, [name], PA-C  08/20/2019, 11:38 AM   I have seen and examined this patient with [name].  Agree with above, note added to reflect my findings.  On exam, RRR, no murmurs, lungs clear.  EGD today shows esophagitis with esophageal stricture.  She remains off of Eliquis, but remains in sinus rhythm.  Would restart Eliquis tomorrow if her hemoglobin stays stable.  Would also continue amiodarone.  At this point, no further EP work-up is necessary.  EP to sign off and Kharlie Bring arrange clinic follow-up.    Emiya Loomer M. Winn Muehl MD 08/20/2019 4:31 PM

## 2019-08-20 NOTE — Anesthesia Procedure Notes (Addendum)
Procedure Name: [place] Performed by: Milford Cage, CRNA Pre-anesthesia Checklist: Patient identified, Emergency Drugs available, Suction available, Patient being monitored and Timeout performed Patient Re-evaluated:Patient Re-evaluated prior to induction Oxygen Delivery Method: Nasal cannula Preoxygenation: Pre-oxygenation with 100% oxygen Placement Confirmation: positive ETCO2

## 2019-08-20 NOTE — Brief Op Note (Signed)
08/18/2019 - 08/20/2019  11:11 AM  PATIENT:  Krista Mason  83 y.o. female  PRE-OPERATIVE DIAGNOSIS:  GI bleed.stricture  POST-OPERATIVE DIAGNOSIS:  esophagitis, esophageal stricture, savary dilation, esophageal biopsies  PROCEDURE:  Procedure(s): ESOPHAGOGASTRODUODENOSCOPY (EGD) WITH PROPOFOL (N/A) SAVORY DILATION (N/A) BIOPSY  SURGEON:  Surgeon(s) and Role:    * Jyllian Haynie, MD - Primary  Findings ----------- -Very tight lower esophageal stricture with LA grade D esophagitis.  Not able to advance regular endoscope and subsequently pediatric endoscope was used.  Sequential dilation with savory up to 11 mm was performed.  Post dilation infection showed superficial mucosal tear as expected but no evidence of significant bleeding or perforation.  Recommendations ------------------------- -Change Protonix drip to IV twice daily.  Her melena is probably from esophagitis. -Start soft diet.  Patient was advised to chew well and moist food before swallowing. -If continues to have trouble swallowing, recommend that she reach out to her primary GI for discussion regarding further dilation. -GI will follow  Otis Brace MD, [place] 08/20/2019, 11:13 AM  Contact #  [phone]

## 2019-08-20 NOTE — Transfer of Care (Signed)
Immediate Anesthesia Transfer of Care Note  Patient: Krista Mason  Procedure(s) Performed: ESOPHAGOGASTRODUODENOSCOPY (EGD) WITH PROPOFOL (N/A ) SAVORY DILATION (N/A ) BIOPSY  Patient Location: Endoscopy Unit  Anesthesia Type:MAC  Level of Consciousness: drowsy  Airway & Oxygen Therapy: Patient Spontanous Breathing and Patient connected to nasal cannula oxygen  Post-op Assessment: Report given to RN and Post -op Vital signs reviewed and stable  Post vital signs: Reviewed and stable  Last Vitals:  Vitals Value Taken Time  BP 126/41 08/20/19 1058  Temp    Pulse 70 08/20/19 1059  Resp 15 08/20/19 1059  SpO2 100 % 08/20/19 1059  Vitals shown include unvalidated device data.  Last Pain:  Vitals:   08/20/19 0955  TempSrc: Temporal  PainSc: 0-No pain      Patients Stated Pain Goal: 0 (41/28/78 6767)  Complications: No apparent anesthesia complications

## 2019-08-20 NOTE — Anesthesia Preprocedure Evaluation (Addendum)
Anesthesia Evaluation  Patient identified by MRN, date of birth, ID band Patient awake    Reviewed: Allergy & Precautions, NPO status , Patient's Chart, lab work & pertinent test results  Airway Mallampati: II  TM Distance: >3 FB Neck ROM: Full    Dental  (+) Upper Dentures, Lower Dentures   Pulmonary former smoker,    Pulmonary exam normal breath sounds clear to auscultation       Cardiovascular hypertension, Pt. on home beta blockers + Peripheral Vascular Disease and +CHF  Normal cardiovascular exam+ pacemaker  Rhythm:Regular Rate:Normal  ECG: rate 70. Sinus rhythm Right bundle branch block  ECHO: 1. The left ventricle has a visually estimated ejection fraction of 35%. The cavity size was normal. There is mildly increased left ventricular wall thickness. Left ventricular diastolic Doppler parameters are consistent with impaired relaxation.  Diffuse hypokinesis with septal-lateral dyssynchrony.  2. The right ventricle has normal systolic function. The cavity was normal. There is no increase in right ventricular wall thickness.  3. Left atrial size was mildly dilated.  4. Trivial pericardial effusion is present.  5. Mild calcification of the mitral valve leaflet. Mitral valve regurgitation is mild to moderate by color flow Doppler. No evidence of mitral valve stenosis.  6. The aortic valve is tricuspid Mild calcification of the aortic valve. Aortic valve regurgitation is mild by color flow Doppler. no stenosis of the aortic valve.  7. The aortic root and ascending aorta are normal in size and structure.  8. Normal IVC size with PA systolic pressure 21 mmHg.   Neuro/Psych negative neurological ROS  negative psych ROS   GI/Hepatic Neg liver ROS, GERD  Medicated and Controlled,  Endo/Other    Renal/GU negative Renal ROS     Musculoskeletal negative musculoskeletal ROS (+)   Abdominal   Peds  Hematology  (+) anemia ,  HLD   Anesthesia Other Findings GI bleed stricture  Reproductive/Obstetrics                            Anesthesia Physical Anesthesia Plan  ASA: III  Anesthesia Plan: MAC   Post-op Pain Management:    Induction: Intravenous  PONV Risk Score and Plan: 2 and Propofol infusion and Treatment may vary due to age or medical condition  Airway Management Planned: Nasal Cannula  Additional Equipment:   Intra-op Plan:   Post-operative Plan:   Informed Consent: I have reviewed the patients History and Physical, chart, labs and discussed the procedure including the risks, benefits and alternatives for the proposed anesthesia with the patient or authorized representative who has indicated his/her understanding and acceptance.       Plan Discussed with: CRNA  Anesthesia Plan Comments:        Anesthesia Quick Evaluation

## 2019-08-20 NOTE — Op Note (Signed)
[facility] Patient Name: Krista Mason Procedure Date : 08/20/2019 MRN: [id number] Attending MD: Otis Brace , MD Date of Birth: 1936/04/30 CSN: [id number] Age: 83 Admit Type: Inpatient Procedure:                Upper GI endoscopy Indications:              Melena, Follow-up of esophageal stricture, For                            therapy of esophageal stricture Providers:                Otis Brace, MD, Carlyn Reichert, RN, Cherylynn Ridges, Technician, Lerry Paterson, CRNA Referring MD:              Medicines:                Sedation Administered by an Anesthesia Professional Complications:            No immediate complications. Superficial mucosal                            tear as expected after dilation. Estimated Blood Loss:     Estimated blood loss was minimal. Procedure:                Pre-Anesthesia Assessment:                           - Prior to the procedure, a History and Physical                            was performed, and patient medications and                            allergies were reviewed. The patient's tolerance of                            previous anesthesia was also reviewed. The risks                            and benefits of the procedure and the sedation                            options and risks were discussed with the patient.                            All questions were answered, and informed consent                            was obtained. Prior Anticoagulants: The patient has                            taken Eliquis (apixaban), last dose was 2 days  prior to procedure. ASA Grade Assessment: III - A                            patient with severe systemic disease. After                            reviewing the risks and benefits, the patient was                            deemed in satisfactory condition to undergo the                            procedure.    After obtaining informed consent, the endoscope was                            passed under direct vision. Throughout the                            procedure, the patient's blood pressure, pulse, and                            oxygen saturations were monitored continuously. The                            [id number]-[id number] ([id number]) Olympus gastroscope was                            introduced through the mouth, and advanced to the                            lower third of esophagus. The [id number]-[id number] ([id number])                            Olympus Ultraslim gastroscope was introduced                            through the mouth, and advanced to the second part                            of duodenum. The upper GI endoscopy was technically                            difficult and complex due to narrowing. Successful                            completion of the procedure was aided by                            withdrawing the scope and replacing with the                            pediatric endoscope. The patient tolerated the  procedure well. Scope In: Scope Out: Findings:      The upper GI endoscopy was technically difficult and complex due to       narrowing. Successful completion of the procedure was aided by       withdrawing the scope and replacing with the pediatric endoscope.      LA Grade D (one or more mucosal breaks involving at least 75% of       esophageal circumference) esophagitis with no bleeding was found in the       Mid and distal esophagus. Biopsies were taken with a cold forceps for       histology.      One benign-appearing, intrinsic severe (stenosis; an endoscope cannot       pass) stenosis was found in the distal esophagus. This stenosis measured       7 mm (inner diameter). A guidewire was placed and the scope was       withdrawn. Dilation was performed with a Savary dilator with no       resistance at 76mm, 9 mm and 11 mm. The dilation site was  examined       following endoscope reinsertion and showed moderate improvement in       luminal narrowing and no perforation. There was superficial mucosal tear       as expected after dilation.      No gross lesions were noted in the entire examined stomach.      The duodenal bulb, first portion of the duodenum and second portion of       the duodenum were normal. Impression:               - LA Grade D esophagitis. Biopsied.                           - Benign-appearing esophageal stenosis. Dilated.                           - No gross lesions in the stomach.                           - Normal duodenal bulb, first portion of the                            duodenum and second portion of the duodenum. Recommendation:           - Return patient to hospital ward for ongoing care.                           - Mechanical soft diet.                           - Resume Eliquis (apixaban) at prior dose tomorrow.                           - Use Protonix (pantoprazole) 40 mg PO BID. Procedure Code(s):        --- Professional ---                           [phone], Esophagogastroduodenoscopy, flexible,  transoral; with insertion of guide wire followed by                            passage of dilator(s) through esophagus over guide                            wire                           43239, 59, Esophagogastroduodenoscopy, flexible,                            transoral; with biopsy, single or multiple Diagnosis Code(s):        --- Professional ---                           K20.9, Esophagitis, unspecified                           K22.2, Esophageal obstruction                           K92.1, Melena (includes Hematochezia) CPT copyright 2019 American Medical Association. All rights reserved. The codes documented in this report are preliminary and upon coder review may  be revised to meet current compliance requirements. Otis Brace, MD Otis Brace, MD 08/20/2019  11:05:15 AM Number of Addenda: 0

## 2019-08-20 NOTE — Interval H&P Note (Signed)
History and Physical Interval Note:  08/20/2019 10:13 AM  Krista Mason  has presented today for surgery, with the diagnosis of GI bleed.stricture.  The various methods of treatment have been discussed with the patient and family. After consideration of risks, benefits and other options for treatment, the patient has consented to  Procedure(s): ESOPHAGOGASTRODUODENOSCOPY (EGD) WITH PROPOFOL (N/A) BALLOON DILATION (N/A) as a surgical intervention.  The patient's history has been reviewed, patient examined, no change in status, stable for surgery.  I have reviewed the patient's chart and labs.  Questions were answered to the patient's satisfaction.     Nelissa Bolduc

## 2019-08-21 DIAGNOSIS — K922 Gastrointestinal hemorrhage, unspecified: Secondary | ICD-10-CM | POA: Diagnosis not present

## 2019-08-21 LAB — CBC
HCT: 27 % — ABNORMAL LOW (ref 36.0–46.0)
Hemoglobin: 8.6 g/dL — ABNORMAL LOW (ref 12.0–15.0)
MCH: 29.3 pg (ref 26.0–34.0)
MCHC: 31.9 g/dL (ref 30.0–36.0)
MCV: 91.8 fL (ref 80.0–100.0)
Platelets: 194 10*3/uL (ref 150–400)
RBC: 2.94 MIL/uL — ABNORMAL LOW (ref 3.87–5.11)
RDW: 14.3 % (ref 11.5–15.5)
WBC: 6.8 10*3/uL (ref 4.0–10.5)
nRBC: 0 % (ref 0.0–0.2)

## 2019-08-21 LAB — RETICULOCYTES
Immature Retic Fract: 17.8 % — ABNORMAL HIGH (ref 2.3–15.9)
RBC.: 3.22 MIL/uL — ABNORMAL LOW (ref 3.87–5.11)
Retic Count, Absolute: 110.1 10*3/uL (ref 19.0–186.0)
Retic Ct Pct: 3.4 % — ABNORMAL HIGH (ref 0.4–3.1)

## 2019-08-21 LAB — GLUCOSE, CAPILLARY
Glucose-Capillary: 155 mg/dL — ABNORMAL HIGH (ref 70–99)
Glucose-Capillary: 141 mg/dL — ABNORMAL HIGH (ref 70–99)
Glucose-Capillary: 106 mg/dL — ABNORMAL HIGH (ref 70–99)
Glucose-Capillary: 127 mg/dL — ABNORMAL HIGH (ref 70–99)

## 2019-08-21 LAB — BASIC METABOLIC PANEL
Anion gap: 11 (ref 5–15)
BUN: 26 mg/dL — ABNORMAL HIGH (ref 8–23)
CO2: 20 mmol/L — ABNORMAL LOW (ref 22–32)
Calcium: 9.1 mg/dL (ref 8.9–10.3)
Chloride: 106 mmol/L (ref 98–111)
Creatinine, Ser: 2.1 mg/dL — ABNORMAL HIGH (ref 0.44–1.00)
GFR calc Af Amer: 25 mL/min — ABNORMAL LOW (ref >60)
GFR calc non Af Amer: 22 mL/min — ABNORMAL LOW (ref >60)
Glucose, Bld: 93 mg/dL (ref 70–99)
Potassium: 3.5 mmol/L (ref 3.5–5.1)
Sodium: 137 mmol/L (ref 135–145)

## 2019-08-21 LAB — IRON AND TIBC
Iron: 39 ug/dL (ref 28–170)
Saturation Ratios: 13 % (ref 10.4–31.8)
TIBC: 301 ug/dL (ref 250–450)
UIBC: 262 ug/dL

## 2019-08-21 LAB — FERRITIN: Ferritin: 47 ng/mL (ref 11–307)

## 2019-08-21 LAB — VITAMIN B12: Vitamin B-12: 215 pg/mL (ref 180–914)

## 2019-08-21 LAB — VITAMIN D 25 HYDROXY (VIT D DEFICIENCY, FRACTURES): Vit D, 25-Hydroxy: 50.31 ng/mL (ref 30–100)

## 2019-08-21 LAB — TSH: TSH: 1.72 u[IU]/mL (ref 0.350–4.500)

## 2019-08-21 LAB — SURGICAL PATHOLOGY

## 2019-08-21 LAB — FOLATE: Folate: 15.8 ng/mL (ref >5.9)

## 2019-08-21 MED ORDER — PEG 3350-KCL-NA BICARB-NACL 420 G PO SOLR
4000.0000 mL | Freq: Once | ORAL | Status: AC
  Administered 2019-08-21: 4000 mL via ORAL
  Filled 2019-08-21: qty 4000

## 2019-08-21 MED ORDER — SODIUM CHLORIDE 0.9 % IV SOLN: INTRAVENOUS | Status: DC

## 2019-08-21 MED ORDER — HYDRALAZINE HCL 25 MG PO TABS
25.0000 mg | ORAL_TABLET | Freq: Two times a day (BID) | ORAL | Status: DC
  Administered 2019-08-21 – 2019-08-25 (×9): 25 mg via ORAL
  Filled 2019-08-21 (×8): qty 1

## 2019-08-21 NOTE — Progress Notes (Signed)
PROGRESS NOTE    Krista Mason  WER:[id number] DOB: June 06, 1936 DOA: 08/18/2019 PCP: Leighton Ruff, MD   Brief Narrative:  HPI On on 08/18/2019 by Dr. Revonda Standard is a 83 y.o. female with medical history significant of CHF, s/p PPM placement 3 weeks ago, A. Fib started on Eliquis 1 weeks ago for stroke prevention 2/2 CHA2DS2-VASc score of 6, started 1 week ago on Amiodarone. Last echo 3/12 with EF 35%. She is followed in the a.fib and CHF clinics. She has chronic esophageal stricture and is s/p dilatation x 5 last 10/29/2018. She has no h/o PUD but does take PPI bid. She has chronic SOB for months unimproved after PPM. Over the last 24-48 hours she has felt weak, light-headed with continued, but not worsening, SOB. She has had several episodes of dark black tarry stools. Due to her symptoms she presents to [facility].   Interim history Admitted with GI bleed. Gastroenterology consulted s/p EGD. Cardiology also consulted for CHF, SOB.  Assessment & Plan   UGI Bleed/ Acute normocytic anemia -Patient states she has had dark stools for some time.  She was started on Eliquis 1 week ago. -Of note, patient has had a history of esophageal stricture and required dilatation.  Patient did have EGD noted in December 2019 as well as January 2020 -FOBT positive -Hemoglobin currently 8.6, was over 12 in August 2020 -was on a protonix drip, however now transitioned to protonix IV BID -Gastroenterology consulted and appreciated, s/p EGD: LA Grade D esophagitis, biopsied.  Benign-appearing esophageal stenosis, dilated.  -Pending further GI recommendations -Pending anemia panel -Continue to monitor CBC  Chronic systolic and diastolic heart failure -Currently appears to be euvolemic and stable -Last echocardiogram 01/01/2019 showed an EF of 35%, LV Doppler parameters consistent with impaired relaxation -Continue Coreg. Lasix held as patient euvolemic and has orthostatic hypotension   Paroxysmal atrial fibrillation -CHADSVASC 6 -Recently placed on Eliquis approximately 1 week ago, which is now been held in the setting of GI bleed -Continue amiodarone, coreg  Diabetes mellitus, type II -Appears to be diet controlled as patient is not on any home medications -Last hemoglobin A1c 6.9 on 06/11/2019 -Continue sliding scale with CBG monitoring  Chronic kidney disease, stage IV -Creatinine currently 2.1, stable, continue to monitor BMP  Orthostatic hypotension -BP does drop with change in position -lasix held -will decrease hydralazine dose to 25mg  BID -Will order compression stockings  Essential hypertension -Continue Coreg, hydralazine  Goals of care/CODE STATUS -Had Garmany discussion with patient regarding her CODE STATUS.  Has opted to become DO NOT RESUSCITATE.  Fatigue -Possibly multifactorial including anemia versus deconditioning versus her history of CHF and A. fib -Will order TSH and vitamin D levels -PT, OT consulted  DVT Prophylaxis  SCDs  Code Status: DNR  Family Communication: None at bedside  Disposition Plan: Admitted.  Pending gastroenterology and cardiology recommendations.    Consultants Cardiology Gastroenterology  Procedures  EGD  Antibiotics   Anti-infectives (From admission, onward)   None      Subjective:   Krista Mason seen and examined today.  Patient sitting up at the side of the bed with mild shortness of breath.  Denies current chest pain, abdominal pain, nausea or vomiting, diarrhea or constipation, dizziness or headache.  Patient does feel very weak and unable to walk around and move around his she would like. Objective:   Vitals:   08/20/19 2028 08/20/19 2211 08/21/19 0603 08/21/19 0612  BP: (!) 138/52 (!) 137/46 [name])  144/46 (!) 100/59  Pulse: 67 76 74 72  Resp:      Temp: 98.8 F (37.1 C)  98.6 F (37 C)   TempSrc: Oral  Oral   SpO2: 96%  97%   Weight:   67 kg   Height:        Intake/Output Summary (Last  24 hours) at 08/21/2019 0843 Last data filed at 08/21/2019 0745 Gross per 24 hour  Intake 1362 ml  Output 601 ml  Net 761 ml   Filed Weights   08/19/19 0500 08/20/19 0445 08/21/19 0603  Weight: 66.7 kg 66.7 kg 67 kg   Exam  General: Well developed, chronically ill-appearing, elderly, NAD  HEENT: NCAT, mucous membranes moist.   Cardiovascular: S1 S2 auscultated, RRR, no murmur  Respiratory: Clear to auscultation bilaterally  Abdomen: Soft, nontender, nondistended, + bowel sounds  Extremities: warm dry without cyanosis clubbing or edema  Neuro: AAOx3, nonfocal  Psych: Pleasant, appropriate mood and affect   Data Reviewed: I have personally reviewed following labs and imaging studies  CBC: Recent Labs  Lab 08/18/19 1511 08/19/19 0510 08/19/19 1558 08/20/19 0417 08/21/19 0339  WBC 7.3  --   --  6.1 6.8  NEUTROABS 5.6  --   --   --   --   HGB 10.5* 10.0* 9.6* 9.1* 8.6*  HCT 31.4* 31.1* 30.2* 27.8* 27.0*  MCV 89.2  --   --  90.0 91.8  PLT 229  --   --  199 500   Basic Metabolic Panel: Recent Labs  Lab 08/18/19 1511 08/20/19 0417 08/21/19 0339  NA 136 137 137  K 3.4* 3.8 3.5  CL 99 105 106  CO2 25 24 20*  GLUCOSE 171* 118* 93  BUN 33* 26* 26*  CREATININE 2.00* 2.11* 2.10*  CALCIUM 9.4 9.0 9.1   GFR: Estimated Creatinine Clearance: 19.8 mL/min (A) (by C-G formula based on SCr of 2.1 mg/dL (H)). Liver Function Tests: Recent Labs  Lab 08/18/19 1511  AST 18  ALT 17  ALKPHOS 81  BILITOT 0.9  PROT 6.6  ALBUMIN 3.5   No results for input(s): LIPASE, AMYLASE in the last 168 hours. No results for input(s): AMMONIA in the last 168 hours. Coagulation Profile: Recent Labs  Lab 08/18/19 1511  INR 1.4*   Cardiac Enzymes: No results for input(s): CKTOTAL, CKMB, CKMBINDEX, TROPONINI in the last 168 hours. BNP (last 3 results) No results for input(s): PROBNP in the last 8760 hours. HbA1C: No results for input(s): HGBA1C in the last 72 hours. CBG:  Recent Labs  Lab 08/20/19 0802 08/20/19 1147 08/20/19 1637 08/20/19 2205 08/21/19 0756  GLUCAP 137* 110* 162* 117* 155*   Lipid Profile: No results for input(s): CHOL, HDL, LDLCALC, TRIG, CHOLHDL, LDLDIRECT in the last 72 hours. Thyroid Function Tests: No results for input(s): TSH, T4TOTAL, FREET4, T3FREE, THYROIDAB in the last 72 hours. Anemia Panel: Recent Labs    08/21/19 0754  RETICCTPCT 3.4*   Urine analysis:    Component Value Date/Time   COLORURINE STRAW (A) 01/28/2018 1353   APPEARANCEUR CLEAR 01/28/2018 1353   LABSPEC 1.006 01/28/2018 1353   PHURINE 6.0 01/28/2018 1353   GLUCOSEU NEGATIVE 01/28/2018 1353   HGBUR NEGATIVE 01/28/2018 1353   BILIRUBINUR NEGATIVE 01/28/2018 1353   KETONESUR NEGATIVE 01/28/2018 1353   PROTEINUR NEGATIVE 01/28/2018 1353   UROBILINOGEN 0.2 04/15/2012 1451   NITRITE NEGATIVE 01/28/2018 1353   LEUKOCYTESUR NEGATIVE 01/28/2018 1353   Sepsis Labs: @LABRCNTIP (procalcitonin:4,lacticidven:4)  ) Recent Results (from the past  240 hour(s))  SARS CORONAVIRUS 2 (TAT 6-24 HRS) Nasopharyngeal Nasopharyngeal Swab     Status: None   Collection Time: 08/18/19  4:33 PM   Specimen: Nasopharyngeal Swab  Result Value Ref Range Status   SARS Coronavirus 2 NEGATIVE NEGATIVE Final    Comment: (NOTE) SARS-CoV-2 target nucleic acids are NOT DETECTED. The SARS-CoV-2 RNA is generally detectable in upper and lower respiratory specimens during the acute phase of infection. Negative results do not preclude SARS-CoV-2 infection, do not rule out co-infections with other pathogens, and should not be used as the sole basis for treatment or other patient management decisions. Negative results must be combined with clinical observations, patient history, and epidemiological information. The expected result is Negative. Fact Sheet for Patients: SugarRoll.be Fact Sheet for Healthcare Providers:  https://www.woods-mathews.com/ This test is not yet approved or cleared by the [place] FDA and  has been authorized for detection and/or diagnosis of SARS-CoV-2 by FDA under an Emergency Use Authorization (EUA). This EUA will remain  in effect (meaning this test can be used) for the duration of the COVID-19 declaration under Section 56 4(b)(1) of the Act, 21 U.S.C. section 360bbb-3(b)(1), unless the authorization is terminated or revoked sooner. Performed at [facility], [place] [address], [place], [place] [address]       Radiology Studies: No results found.   Scheduled Meds: . amiodarone  200 mg Oral Daily  . atorvastatin  20 mg Oral QHS  . carvedilol  12.5 mg Oral BID  . feeding supplement (ENSURE ENLIVE)  237 mL Oral BID BM  . hydrALAZINE  25 mg Oral BID  . insulin aspart  0-15 Units Subcutaneous TID WC  . pantoprazole (PROTONIX) IV  40 mg Intravenous Q12H   Continuous Infusions:    LOS: 3 days   Time Spent in minutes   30 minutes  Krista Mason D.O. on 08/21/2019 at 8:43 AM  Between 7am to 7pm - Please see pager noted on [email or web address]  After 7pm go to [email or web address]  And look for the night coverage person covering for me after hours  [facility] Office  [phone]

## 2019-08-21 NOTE — Care Management Important Message (Signed)
Important Message  Patient Details  Name: Krista Mason MRN: [id number] Date of Birth: 1936-09-05   Medicare Important Message Given:  Yes     Shelda Altes 08/21/2019, 1:00 PM

## 2019-08-21 NOTE — Progress Notes (Signed)
[facility] Progress Note  Krista Mason 83 y.o. 1936-05-09   Subjective: Reports a black stool yesterday (chart says it was brown). Denies black stools overnight. Denies abdominal pain.  Objective: Vital signs: Vitals:   08/21/19 0603 08/21/19 0612  BP: (!) 144/46 (!) 100/59  Pulse: 74 72  Resp:    Temp: 98.6 F (37 C)   SpO2: 97%     Physical Exam: Gen: alert, no acute distress, elderly, pleasant HEENT: anicteric sclera CV: RRR Chest: CTA B Abd: soft, nontender, nondistended, +BS Ext: no edema  Lab Results: Recent Labs    08/20/19 0417 08/21/19 0339  NA 137 137  K 3.8 3.5  CL 105 106  CO2 24 20*  GLUCOSE 118* 93  BUN 26* 26*  CREATININE 2.11* 2.10*  CALCIUM 9.0 9.1   Recent Labs    08/18/19 1511  AST 18  ALT 17  ALKPHOS 81  BILITOT 0.9  PROT 6.6  ALBUMIN 3.5   Recent Labs    08/18/19 1511  08/20/19 0417 08/21/19 0339  WBC 7.3  --  6.1 6.8  NEUTROABS 5.6  --   --   --   HGB 10.5*   < > 9.1* 8.6*  HCT 31.4*   < > 27.8* 27.0*  MCV 89.2  --  90.0 91.8  PLT 229  --  199 194   < > = values in this interval not displayed.      Assessment/Plan: Melena without a definite source seen on EGD with severe esophagitis and a distal esophageal stricture that was dilated to 11 mm. Esophageal biopsies taken. Hgb drop to 8.6 and needs a colonoscopy prior to resuming Eliquis. Continue to hold Eliquis. Patient is agreeable to do the colonoscopy. If the colonoscopy is unrevealing, then may need a small bowel capsule endoscopy. Clear liquid diet today. Colon prep this afternoon and encouraged to drink the whole prep. NPO p MN. Colonoscopy tomorrow by Dr. Oletta Lamas at [facility] with Propofol.   Lear Ng 08/21/2019, 9:27 AM  Questions please call [phone] ID: Vidal Schwalbe, female   DOB: 1936/06/23, 83 y.o.   MRN: [id number]

## 2019-08-21 NOTE — Progress Notes (Addendum)
Electrophysiology Rounding Note  Patient Name: Krista Mason Date of Encounter: 08/21/2019  Primary Cardiologist: Dr. Haroldine Laws  Electrophysiologist: Dr. Curt Bears   Subjective   The patient is feeling well this am. Had dark but non-tarry BM yesterday. At this time, denies CP, SOB, or any new symptoms.   Hgb 9.6 -> 9.1 -> 8.6  Inpatient Medications    Scheduled Meds: . amiodarone  200 mg Oral Daily  . atorvastatin  20 mg Oral QHS  . carvedilol  12.5 mg Oral BID  . feeding supplement (ENSURE ENLIVE)  237 mL Oral BID BM  . hydrALAZINE  50 mg Oral BID  . insulin aspart  0-15 Units Subcutaneous TID WC  . pantoprazole (PROTONIX) IV  40 mg Intravenous Q12H   Continuous Infusions:  PRN Meds: acetaminophen **OR** acetaminophen, polyvinyl alcohol, traZODone   Vital Signs    Vitals:   08/20/19 2028 08/20/19 2211 08/21/19 0603 08/21/19 0612  BP: (!) 138/52 (!) 137/46 (!) 144/46 (!) 100/59  Pulse: 67 76 74 72  Resp:      Temp: 98.8 F (37.1 C)  98.6 F (37 C)   TempSrc: Oral  Oral   SpO2: 96%  97%   Weight:   67 kg   Height:        Intake/Output Summary (Last 24 hours) at 08/21/2019 0721 Last data filed at 08/20/2019 2157 Gross per 24 hour  Intake 1002 ml  Output 601 ml  Net 401 ml   Filed Weights   08/19/19 0500 08/20/19 0445 08/21/19 0603  Weight: 66.7 kg 66.7 kg 67 kg    Physical Exam    GEN- The patient is well appearing, alert and oriented x 3 today.   Head- normocephalic, atraumatic Eyes-  Sclera clear, conjunctiva pink Ears- hearing intact Oropharynx- clear Neck- supple Lungs- Clear to ausculation bilaterally, normal work of breathing Heart- Regular rate and rhythm, no murmurs, rubs or gallops GI- soft, NT, ND, + BS Extremities- no clubbing, cyanosis, or edema Skin- no rash or lesion Psych- euthymic mood, full affect Neuro- strength and sensation are intact  Labs    CBC Recent Labs    08/18/19 1511  08/20/19 0417 08/21/19 0339  WBC 7.3   --  6.1 6.8  NEUTROABS 5.6  --   --   --   HGB 10.5*   < > 9.1* 8.6*  HCT 31.4*   < > 27.8* 27.0*  MCV 89.2  --  90.0 91.8  PLT 229  --  199 194   < > = values in this interval not displayed.   Basic Metabolic Panel Recent Labs    08/20/19 0417 08/21/19 0339  NA 137 137  K 3.8 3.5  CL 105 106  CO2 24 20*  GLUCOSE 118* 93  BUN 26* 26*  CREATININE 2.11* 2.10*  CALCIUM 9.0 9.1   Liver Function Tests Recent Labs    08/18/19 1511  AST 18  ALT 17  ALKPHOS 81  BILITOT 0.9  PROT 6.6  ALBUMIN 3.5   No results for input(s): LIPASE, AMYLASE in the last 72 hours. Cardiac Enzymes No results for input(s): CKTOTAL, CKMB, CKMBINDEX, TROPONINI in the last 72 hours. BNP Invalid input(s): POCBNP D-Dimer No results for input(s): DDIMER in the last 72 hours. Hemoglobin A1C No results for input(s): HGBA1C in the last 72 hours. Fasting Lipid Panel No results for input(s): CHOL, HDL, LDLCALC, TRIG, CHOLHDL, LDLDIRECT in the last 72 hours. Thyroid Function Tests No results for input(s): TSH, T4TOTAL,  T3FREE, THYROIDAB in the last 72 hours.  Invalid input(s): FREET3  Telemetry    NSR, V paced 60-70s (personally reviewed)  Radiology    No results found.   Patient Profile     Krista Mason a 83 y.o.femalewith a history of paroxysmal afib, chronic systolic CHF, LBBBwho is being seen for the evaluation of GI bleed on Eliquisat the request of Dr. Ree Kida.  Assessment & Plan    1. GI bleed - EGD 08/20/2019 with esophagitis (biopsied) and stricture (dilated) - Hgb 9.6 -> 9.1 -> 8.6  2. Paroxysmal Afib - Continue amiodarone - CHA2DS2VASC of at least 6, so Krista Mason plan on resuming Eliquis once Hgb stable. Krista Mason discuss timing with MD  3. NICM s/p MDT CRT-P - EF 35% 01/01/2019 - Volume status dry. Would continue to hold lasix until eating/drinking normally.   4. Orthostatic hypotension - This am -> BP 144/46 -> 119/50 lying to sitting, and further down to 100/59 after  standing for 3 minutes.  - IM concerned about dilutional component if given IV fluids, so Krista Mason hold off for now.  - Plan to decrease hydralazine to 25 mg BID for now. (from 39)  For questions or updates, please contact Krista Mason Please consult [email or web address] for contact info under Cardiology/STEMI.  Signed, Krista Friar, PA-C  08/21/2019, 7:21 AM   I have seen and examined this patient with Krista Mason.  Agree with above, note added to reflect my findings.  On exam, RRR, no murmurs, lungs clear. Fortunately she remains in sinus rhythm. Would continue current meds. Once her hemoglobin stabilizes, would restart eliquis. She is getting dizzy and has been orthostatic. This is likely due to volume depletion from her GI bleed. Krista Mason give her back IV fluids to see if this helps. Would discharge home on prior dose of amiodarone and eliquis. EP to see as needed over the weekend.    Krista Mason M. Krista Berenson MD 08/21/2019 8:36 AM

## 2019-08-21 NOTE — Evaluation (Signed)
Physical Therapy Evaluation Patient Details Name: Krista Mason MRN: [id number] DOB: [birth year] Today's Date: 08/21/2019   History of Present Illness  Pt adm with GI bleed. PMH - pacer 3 weeks ago, chf, htn, dm, afib, orthostatic hypotension  Clinical Impression  Pt presents to PT with decr functional activity tolerance which is impairing her ability to return home alone to her apartment. Currently recommend ST-SNF unless her activity tolerance improves. She is only tolerating very short periods of activity which would make it difficult to manage at home.     Follow Up Recommendations SNF    Equipment Recommendations  None recommended by PT    Recommendations for Other Services       Precautions / Restrictions Precautions Precautions: Fall Restrictions Weight Bearing Restrictions: No      Mobility  Bed Mobility Overal bed mobility: Modified Independent             General bed mobility comments: Incr time and effort  Transfers Overall transfer level: Needs assistance Equipment used: 4-wheeled walker Transfers: Sit to/from Stand Sit to Stand: Min guard         General transfer comment: Assist for safety and balance.  Ambulation/Gait Ambulation/Gait assistance: Min guard Gait Distance (Feet): 125 Feet Assistive device: 4-wheeled walker Gait Pattern/deviations: Step-through pattern;Decreased stride length Gait velocity: decr Gait velocity interpretation: <1.31 ft/sec, indicative of household ambulator General Gait Details: Slightly unsteady gait which requires rollator and min guard assist for stability. Pt required 2 standing rest breaks for dyspnea and dizziness  Stairs            Wheelchair Mobility    Modified Rankin (Stroke Patients Only)       Balance Overall balance assessment: Needs assistance Sitting-balance support: No upper extremity supported;Feet supported Sitting balance-Leahy Scale: Good     Standing balance support: Single  extremity supported Standing balance-Leahy Scale: Poor Standing balance comment: UE support                             Pertinent Vitals/Pain Pain Assessment: No/denies pain    Home Living Family/patient expects to be discharged to:: Private residence Living Arrangements: Alone Available Help at Discharge: Friend(s);Available PRN/intermittently Type of Home: Independent living facility Home Access: Level entry     Home Layout: One level Home Equipment: Walker - 2 wheels;Walker - 4 wheels;Shower seat      Prior Function Level of Independence: Independent with assistive device(s)         Comments: Has been struggling with functional activity tolerance since pacer placement     Hand Dominance        Extremity/Trunk Assessment   Upper Extremity Assessment Upper Extremity Assessment: Defer to OT evaluation    Lower Extremity Assessment Lower Extremity Assessment: Generalized weakness       Communication   Communication: No difficulties  Cognition Arousal/Alertness: Awake/alert Behavior During Therapy: WFL for tasks assessed/performed Overall Cognitive Status: Within Functional Limits for tasks assessed                                        General Comments      Exercises     Assessment/Plan    PT Assessment Patient needs continued PT services  PT Problem List Decreased strength;Decreased activity tolerance;Decreased balance;Decreased mobility;Cardiopulmonary status limiting activity       PT Treatment Interventions DME instruction;Gait  training;Functional mobility training;Therapeutic activities;Therapeutic exercise;Balance training;Patient/family education    PT Goals (Current goals can be found in the Care Plan section)  Acute Rehab PT Goals Patient Stated Goal: get stronger PT Goal Formulation: With patient Time For Goal Achievement: 09/04/19 Potential to Achieve Goals: Good    Frequency Min 3X/week   Barriers to  discharge Decreased caregiver support Lives alone    Co-evaluation               AM-PAC PT "6 Clicks" Mobility  Outcome Measure Help needed turning from your back to your side while in a flat bed without using bedrails?: None Help needed moving from lying on your back to sitting on the side of a flat bed without using bedrails?: None Help needed moving to and from a bed to a chair (including a wheelchair)?: A Little Help needed standing up from a chair using your arms (e.g., wheelchair or bedside chair)?: A Little Help needed to walk in hospital room?: A Little Help needed climbing 3-5 steps with a railing? : A Little 6 Click Score: 20    End of Session   Activity Tolerance: Patient limited by fatigue Patient left: in bed;with call bell/phone within reach   PT Visit Diagnosis: Unsteadiness on feet (R26.81);Muscle weakness (generalized) (M62.81)    Time: 7414-2395 PT Time Calculation (min) (ACUTE ONLY): 16 min   Charges:   PT Evaluation $PT Eval Moderate Complexity: [place] Pager [phone] Office [place] 08/21/2019, 10:34 AM

## 2019-08-22 ENCOUNTER — Inpatient Hospital Stay (HOSPITAL_COMMUNITY): Payer: Medicare HMO | Admitting: Certified Registered"

## 2019-08-22 ENCOUNTER — Encounter (HOSPITAL_COMMUNITY)
Admission: EM | Disposition: A | Discharge status: Home Health | Payer: Self-pay | Source: Home / Self Care | Attending: Internal Medicine

## 2019-08-22 ENCOUNTER — Encounter (HOSPITAL_COMMUNITY): Payer: Self-pay | Admitting: *Deleted

## 2019-08-22 HISTORY — PX: POLYPECTOMY: SHX5525

## 2019-08-22 HISTORY — PX: COLONOSCOPY WITH PROPOFOL: SHX5780

## 2019-08-22 HISTORY — PX: HEMOSTASIS CLIP PLACEMENT: SHX6857

## 2019-08-22 LAB — GLUCOSE, CAPILLARY
Glucose-Capillary: 128 mg/dL — ABNORMAL HIGH (ref 70–99)
Glucose-Capillary: 84 mg/dL (ref 70–99)
Glucose-Capillary: 113 mg/dL — ABNORMAL HIGH (ref 70–99)

## 2019-08-22 LAB — BASIC METABOLIC PANEL
Anion gap: 12 (ref 5–15)
BUN: 24 mg/dL — ABNORMAL HIGH (ref 8–23)
CO2: 20 mmol/L — ABNORMAL LOW (ref 22–32)
Calcium: 9.2 mg/dL (ref 8.9–10.3)
Chloride: 105 mmol/L (ref 98–111)
Creatinine, Ser: 2 mg/dL — ABNORMAL HIGH (ref 0.44–1.00)
GFR calc Af Amer: 26 mL/min — ABNORMAL LOW (ref >60)
GFR calc non Af Amer: 23 mL/min — ABNORMAL LOW (ref >60)
Glucose, Bld: 108 mg/dL — ABNORMAL HIGH (ref 70–99)
Potassium: 3.9 mmol/L (ref 3.5–5.1)
Sodium: 137 mmol/L (ref 135–145)

## 2019-08-22 LAB — HEMOGLOBIN AND HEMATOCRIT, BLOOD
HCT: 26.7 % — ABNORMAL LOW (ref 36.0–46.0)
Hemoglobin: 8.6 g/dL — ABNORMAL LOW (ref 12.0–15.0)

## 2019-08-22 SURGERY — COLONOSCOPY WITH PROPOFOL
Anesthesia: Monitor Anesthesia Care

## 2019-08-22 MED ORDER — PHENYLEPHRINE HCL (PRESSORS) 10 MG/ML IV SOLN
INTRAVENOUS | Status: DC | PRN
  Administered 2019-08-22: 80 ug via INTRAVENOUS

## 2019-08-22 MED ORDER — LACTATED RINGERS IV SOLN
INTRAVENOUS | Status: DC | PRN
  Administered 2019-08-22: 08:00:00 via INTRAVENOUS

## 2019-08-22 MED ORDER — PROPOFOL 10 MG/ML IV BOLUS
INTRAVENOUS | Status: DC | PRN
  Administered 2019-08-22: 20 mg via INTRAVENOUS
  Administered 2019-08-22: 30 mg via INTRAVENOUS
  Administered 2019-08-22 (×2): 20 mg via INTRAVENOUS

## 2019-08-22 MED ORDER — PROPOFOL 500 MG/50ML IV EMUL
INTRAVENOUS | Status: DC | PRN
  Administered 2019-08-22: 100 ug/kg/min via INTRAVENOUS

## 2019-08-22 SURGICAL SUPPLY — 21 items

## 2019-08-22 NOTE — Interval H&P Note (Signed)
History and Physical Interval Note:  08/22/2019 7:57 AM  Krista Mason  has presented today for surgery, with the diagnosis of GI bleed.  The various methods of treatment have been discussed with the patient and family. After consideration of risks, benefits and other options for treatment, the patient has consented to  Procedure(s): COLONOSCOPY WITH PROPOFOL (N/A) as a surgical intervention.  The patient's history has been reviewed, patient examined, no change in status, stable for surgery.  I have reviewed the patient's chart and labs.  Questions were answered to the patient's satisfaction.     Nancy Fetter

## 2019-08-22 NOTE — Transfer of Care (Signed)
Immediate Anesthesia Transfer of Care Note  Patient: Krista Mason  Procedure(s) Performed: COLONOSCOPY WITH PROPOFOL (N/A ) POLYPECTOMY HEMOSTASIS CLIP PLACEMENT  Patient Location: PACU  Anesthesia Type:MAC  Level of Consciousness: awake, alert , oriented and patient cooperative  Airway & Oxygen Therapy: Patient Spontanous Breathing and Patient connected to face mask oxygen  Post-op Assessment: Report given to RN and Post -op Vital signs reviewed and stable  Post vital signs: Reviewed and stable  Last Vitals:  Vitals Value Taken Time  BP 168/34 08/22/19 0852  Temp    Pulse 70 08/22/19 0852  Resp 21 08/22/19 0852  SpO2 100 % 08/22/19 0852  Vitals shown include unvalidated device data.  Last Pain:  Vitals:   08/22/19 0729  TempSrc: Temporal  PainSc: 0-No pain      Patients Stated Pain Goal: 0 (09/11/61 4469)  Complications: No apparent anesthesia complications

## 2019-08-22 NOTE — Evaluation (Addendum)
Occupational Therapy Evaluation Patient Details Name: Krista Mason MRN: [id number] DOB: April 24, 1936 Today's Date: 08/22/2019    History of Present Illness Pt adm with GI bleed. S/P colonoscopy 10/31. PMH - pacer 3 weeks ago, chf, htn, dm, afib, orthostatic hypotension   Clinical Impression   PTA patient independent using rollator, completing ADLs and limited IADLs at independent living apartment.  Admitted for above and limited by problem list below, including decreased activity tolerance, impaired balance, limited functional use of L UE (since pacer), and generalized weakness. She currently requires min guard to close supervision for transfers and in room mobility, supervision for grooming at sink standing, min guard for LB ADLs. Noted decreased tolerance and difficulty managing at home since pacer placement, 3-4 weeks ago. She will benefit from continued OT services while admitted and after dc at SNF level in order to optimize independence and safety with ADLs, mobility and IADLs in order to return to PLOF.     Follow Up Recommendations  SNF;Supervision/Assistance - 24 hour(if declines SNF, will need HHOT )    Equipment Recommendations  None recommended by OT    Recommendations for Other Services       Precautions / Restrictions Precautions Precautions: Fall Restrictions Weight Bearing Restrictions: No      Mobility Bed Mobility Overal bed mobility: Modified Independent             General bed mobility comments: Incr time and effort  Transfers Overall transfer level: Needs assistance Equipment used: Rolling walker (2 wheeled) Transfers: Sit to/from Stand Sit to Stand: Min guard         General transfer comment: Assist for safety and balance.    Balance Overall balance assessment: Needs assistance Sitting-balance support: No upper extremity supported;Feet supported Sitting balance-Leahy Scale: Good     Standing balance support: Bilateral upper extremity  supported;During functional activity;No upper extremity supported Standing balance-Leahy Scale: Poor Standing balance comment: able to groom without UE support but leaning on sink with hips; relaint to BUE support dynamically                           ADL either performed or assessed with clinical judgement   ADL Overall ADL's : Needs assistance/impaired     Grooming: Supervision/safety;Standing   Upper Body Bathing: Set up;Sitting   Lower Body Bathing: Min guard;Sit to/from stand   Upper Body Dressing : Set up;Sitting   Lower Body Dressing: Min guard;Sit to/from stand Lower Body Dressing Details (indicate cue type and reason): donning mesh underwear with supervision, min guard sit to stand  Toilet Transfer: Supervision/safety;Ambulation;RW;Regular Toilet   Toileting- Water quality scientist and Hygiene: Supervision/safety;Sit to/from stand       Functional mobility during ADLs: Min guard;Supervision/safety;Rolling walker General ADL Comments: pt limited by decreased activity tolerance and endurance      Vision   Vision Assessment?: No apparent visual deficits     Perception     Praxis      Pertinent Vitals/Pain Pain Assessment: No/denies pain     Hand Dominance Left   Extremity/Trunk Assessment Upper Extremity Assessment Upper Extremity Assessment: LUE deficits/detail;Generalized weakness LUE Deficits / Details: limited functional use due to pacer restrictions for 6-8 weeks    Lower Extremity Assessment Lower Extremity Assessment: Defer to PT evaluation       Communication Communication Communication: No difficulties   Cognition Arousal/Alertness: Awake/alert Behavior During Therapy: WFL for tasks assessed/performed Overall Cognitive Status: Within Functional Limits for tasks assessed  General Comments  VSS    Exercises     Shoulder Instructions      Home Living Family/patient expects to  be discharged to:: Private residence Living Arrangements: Alone Available Help at Discharge: Friend(s);Available PRN/intermittently Type of Home: Independent living facility Home Access: Level entry     Home Layout: One level     Bathroom Shower/Tub: Teacher, early years/pre: Handicapped height     Home Equipment: Environmental consultant - 2 wheels;Walker - 4 wheels;Shower seat;Grab bars - toilet;Grab bars - tub/shower;Hand held shower head          Prior Functioning/Environment Level of Independence: Independent with assistive device(s)        Comments: independent ADLs, laundry (indp living cleans and provides meals) Has been struggling with functional activity tolerance since pacer placement        OT Problem List: Decreased strength;Decreased activity tolerance;Impaired balance (sitting and/or standing);Decreased knowledge of use of DME or AE;Cardiopulmonary status limiting activity      OT Treatment/Interventions: Self-care/ADL training;DME and/or AE instruction;Therapeutic activities;Patient/family education;Balance training;Energy conservation    OT Goals(Current goals can be found in the care plan section) Acute Rehab OT Goals Patient Stated Goal: get stronger OT Goal Formulation: With patient Time For Goal Achievement: 09/05/19 Potential to Achieve Goals: Good  OT Frequency: Min 2X/week   Barriers to D/C:            Co-evaluation              AM-PAC OT "6 Clicks" Daily Activity     Outcome Measure Help from another person eating meals?: None Help from another person taking care of personal grooming?: A Little Help from another person toileting, which includes using toliet, bedpan, or urinal?: A Little Help from another person bathing (including washing, rinsing, drying)?: A Little Help from another person to put on and taking off regular upper body clothing?: A Little Help from another person to put on and taking off regular lower body clothing?: A  Little 6 Click Score: 19   End of Session Equipment Utilized During Treatment: Rolling walker Nurse Communication: Mobility status  Activity Tolerance: Patient tolerated treatment well Patient left: in bed;with call bell/phone within reach  OT Visit Diagnosis: Other abnormalities of gait and mobility (R26.89);Muscle weakness (generalized) (M62.81)                Time: 6945-0388 OT Time Calculation (min): 28 min Charges:  OT General Charges $OT Visit: 1 Visit OT Evaluation $OT Eval Moderate Complexity: 1 Mod OT Treatments $Self Care/Home Management : 8-22 mins  Delight Stare, OT Acute Rehabilitation Services Pager [phone] Office [phone]   Delight Stare 08/22/2019, 12:54 PM

## 2019-08-22 NOTE — Op Note (Signed)
[facility] Patient Name: Krista Mason Procedure Date : 08/22/2019 MRN: [id number] Attending MD: Nancy Fetter Dr., MD Date of Birth: 06-Jan-1936 CSN: [id number] Age: 83 Admit Type: Inpatient Procedure:                Colonoscopy with polypectomy and placement of clips Indications:              Heme positive stool, Melena, EGD showed esophgitis Providers:                Jeneen Rinks L. Serrina Minogue Dr., MD, Carlyn Reichert, RN, Josie Dixon, RN, Lazaro Arms, Technician Referring MD:              Medicines:                Monitored Anesthesia Care Complications:            No immediate complications. Estimated blood loss:                            Minimal. Estimated Blood Loss:     Estimated blood loss was minimal. Procedure:                Pre-Anesthesia Assessment:                           - Prior to the procedure, a History and Physical                            was performed, and patient medications and                            allergies were reviewed. The patient's tolerance of                            previous anesthesia was also reviewed. The risks                            and benefits of the procedure and the sedation                            options and risks were discussed with the patient.                            All questions were answered, and informed consent                            was obtained. Prior Anticoagulants: The patient has                            taken Eliquis (apixaban), last dose was 4 days                            prior to procedure. ASA Grade Assessment: III - A  patient with severe systemic disease. After                            reviewing the risks and benefits, the patient was                            deemed in satisfactory condition to undergo the                            procedure.                           After obtaining informed consent, the colonoscope                 was passed under direct vision. Throughout the                            procedure, the patient's blood pressure, pulse, and                            oxygen saturations were monitored continuously. The                            PCF-H190DL ([id number] Olympus pediatric colonscope                            was introduced through the anus and advanced to the                            the cecum, identified by appendiceal orifice and                            ileocecal valve. The colonoscopy was somewhat                            difficult due to multiple diverticula in the colon.                            The patient tolerated the procedure well. The                            quality of the bowel preparation was fair. The                            ileocecal valve, appendiceal orifice, and rectum                            were photographed. Scope In: 8:15:11 AM Scope Out: 8:45:43 AM Scope Withdrawal Time: 0 hours 16 minutes 54 seconds  Total Procedure Duration: 0 hours 30 minutes 32 seconds  Findings:      Multiple small and large-mouthed diverticula were found in the sigmoid       colon and descending colon. There was narrowing of the colon in       association with the diverticular opening. Multiple balls  of stool       present despite prep.      A 7 mm polyp was found in the cecum. The polyp was sessile. The polyp       was removed with a hot snare. Resection and retrieval were complete. The       pathology specimen was placed into Bottle Number 1. Estimated blood       loss: Minimal. but some oozing. For hemostasis, two hemostatic clips       were successfully placed (MR conditional). There was no bleeding at the       end of the procedure. Impression:               - Preparation of the colon was fair.                           - Moderate diverticulosis in the sigmoid colon and                            in the descending colon. There was narrowing of the                             colon in association with the diverticular opening.                           - One 7 mm polyp in the cecum, removed with a hot                            snare. Resected and retrieved. Clips (MR                            conditional) were placed. Recommendation:           - Return patient to hospital ward for ongoing care.                           - Low fiber diet.                           - Miralax 1 capful (17 grams) in 8 ounces of water                            PO BID. Procedure Code(s):        --- Professional ---                           [phone], Colonoscopy, flexible; with removal of                            tumor(s), polyp(s), or other lesion(s) by snare                            technique Diagnosis Code(s):        --- Professional ---                           K63.5, Polyp of colon  R19.5, Other fecal abnormalities                           K92.1, Melena (includes Hematochezia)                           K57.30, Diverticulosis of large intestine without                            perforation or abscess without bleeding CPT copyright 2019 [employer]. All rights reserved. The codes documented in this report are preliminary and upon coder review may  be revised to meet current compliance requirements. Nancy Fetter Dr., MD 08/22/2019 8:59:33 AM This report has been signed electronically. Number of Addenda: 0

## 2019-08-22 NOTE — Anesthesia Postprocedure Evaluation (Signed)
Anesthesia Post Note  Patient: Krista Mason  Procedure(s) Performed: COLONOSCOPY WITH PROPOFOL (N/A ) POLYPECTOMY HEMOSTASIS CLIP PLACEMENT     Patient location during evaluation: Endoscopy Anesthesia Type: MAC Level of consciousness: awake and alert Pain management: pain level controlled Vital Signs Assessment: post-procedure vital signs reviewed and stable Respiratory status: spontaneous breathing, nonlabored ventilation, respiratory function stable and patient connected to nasal cannula oxygen Cardiovascular status: stable and blood pressure returned to baseline Postop Assessment: no apparent nausea or vomiting Anesthetic complications: no    Last Vitals:  Vitals:   08/22/19 1038 08/22/19 2029  BP: (!) 157/55 (!) 148/62  Pulse:  68  Resp:  19  Temp:  36.8 C  SpO2:  98%    Last Pain:  Vitals:   08/22/19 2029  TempSrc: Oral  PainSc:                  [name] COKER

## 2019-08-22 NOTE — Progress Notes (Signed)
PROGRESS NOTE    Krista Mason  KWI:[id number] DOB: [birth year] DOA: 08/18/2019 PCP: Leighton Ruff, MD   Brief Narrative:  HPI On on 08/18/2019 by Dr. Revonda Standard is a 83 y.o. female with medical history significant of CHF, s/p PPM placement 3 weeks ago, A. Fib started on Eliquis 1 weeks ago for stroke prevention 2/2 CHA2DS2-VASc score of 6, started 1 week ago on Amiodarone. Last echo 3/12 with EF 35%. She is followed in the a.fib and CHF clinics. She has chronic esophageal stricture and is s/p dilatation x 5 last 10/29/2018. She has no h/o PUD but does take PPI bid. She has chronic SOB for months unimproved after PPM. Over the last 24-48 hours she has felt weak, light-headed with continued, but not worsening, SOB. She has had several episodes of dark black tarry stools. Due to her symptoms she presents to [facility].   Interim history Admitted with GI bleed. Gastroenterology consulted s/p EGD. Cardiology also consulted for CHF, SOB. Pending colonsocopy today.  Assessment & Plan   UGI Bleed/ Acute normocytic anemia -Patient states she has had dark stools for some time.  She was started on Eliquis 1 week ago. -Of note, patient has had a history of esophageal stricture and required dilatation.  Patient did have EGD noted in December 2019 as well as January 2020 -FOBT positive -Hemoglobin currently 8.6, was over 12 in August 2020 -was on a protonix drip, however now transitioned to protonix IV BID -Gastroenterology consulted and appreciated, s/p EGD: LA Grade D esophagitis, biopsied.  Benign-appearing esophageal stenosis, dilated.  -Pending colonoscopy today -Anemia panel showed adequate iron and storage.   Chronic systolic and diastolic heart failure -Currently appears to be euvolemic and stable -Last echocardiogram 01/01/2019 showed an EF of 35%, LV Doppler parameters consistent with impaired relaxation -Continue Coreg. Lasix held as patient euvolemic and has orthostatic  hypotension  Paroxysmal atrial fibrillation -CHADSVASC 6 -Recently placed on Eliquis approximately 1 week ago, which is now been held in the setting of GI bleed -Continue amiodarone, coreg  Diabetes mellitus, type II -Appears to be diet controlled as patient is not on any home medications -Last hemoglobin A1c 6.9 on 06/11/2019 -Continue sliding scale with CBG monitoring  Chronic kidney disease, stage IV -Creatinine currently 2, stable, continue to monitor BMP  Orthostatic hypotension -BP does drop with change in position -lasix held -will decrease hydralazine dose to 25mg  BID -Continue compression stockings  Essential hypertension -Continue Coreg, hydralazine  Goals of care/CODE STATUS -Had [place] discussion with patient regarding her CODE STATUS.  Has opted to become DO NOT RESUSCITATE.  Fatigue -Possibly multifactorial including anemia versus deconditioning versus her history of CHF and A. fib -Vitamin D 50.31, TSH 1.720 (WNL) -PT rec SNF -Pending OT  DVT Prophylaxis  SCDs  Code Status: DNR  Family Communication: None at bedside  Disposition Plan: Admitted.  Pending colonoscopy. Dispo TBD  Consultants Cardiology Gastroenterology  Procedures  EGD  Antibiotics   Anti-infectives (From admission, onward)   None      Subjective:   Krista Mason seen and examined today.  Patient states that she did have a small bowel type bowel movements overnight, however none were dark.  Denies current chest pain, shortness breath, abdominal pain, nausea or vomiting, dizziness or headache. Objective:   Vitals:   08/22/19 0553 08/22/19 0729 08/22/19 0853 08/22/19 0903  BP: (!) 139/59 (!) 190/64 (!) 168/34 (!) 169/49  Pulse: 69  70 70  Resp: 16 20 (!) 22 18  Temp: 97.7 F (36.5 C) (!) 97.2 F (36.2 C) (!) 95.8 F (35.4 C)   TempSrc: Oral Temporal Temporal   SpO2: 98% 99% 100% 99%  Weight: 67.9 kg 67.9 kg    Height:  5\' 4"  (1.626 m)      Intake/Output Summary (Last 24  hours) at 08/22/2019 0934 Last data filed at 08/22/2019 0849 Gross per 24 hour  Intake 1740 ml  Output 200 ml  Net 1540 ml   Filed Weights   08/21/19 0603 08/22/19 0553 08/22/19 0729  Weight: 67 kg 67.9 kg 67.9 kg   Exam  General: Well developed, chronically ill-appearing, NAD  HEENT: NCAT, mucous membranes moist.   Cardiovascular: S1 S2 auscultated, RRR, no murmur  Respiratory: Clear to auscultation bilaterally   Abdomen: Soft, nontender, nondistended, + bowel sounds  Extremities: warm dry without cyanosis clubbing or edema  Neuro: AAOx3, nonfocal  Psych: Pleasant, appropriate mood and affect    Data Reviewed: I have personally reviewed following labs and imaging studies  CBC: Recent Labs  Lab 08/18/19 1511 08/19/19 0510 08/19/19 1558 08/20/19 0417 08/21/19 0339 08/22/19 0451  WBC 7.3  --   --  6.1 6.8  --   NEUTROABS 5.6  --   --   --   --   --   HGB 10.5* 10.0* 9.6* 9.1* 8.6* 8.6*  HCT 31.4* 31.1* 30.2* 27.8* 27.0* 26.7*  MCV 89.2  --   --  90.0 91.8  --   PLT 229  --   --  199 194  --    Basic Metabolic Panel: Recent Labs  Lab 08/18/19 1511 08/20/19 0417 08/21/19 0339 08/22/19 0451  NA 136 137 137 137  K 3.4* 3.8 3.5 3.9  CL 99 105 106 105  CO2 25 24 20* 20*  GLUCOSE 171* 118* 93 108*  BUN 33* 26* 26* 24*  CREATININE 2.00* 2.11* 2.10* 2.00*  CALCIUM 9.4 9.0 9.1 9.2   GFR: Estimated Creatinine Clearance: 20.9 mL/min (A) (by C-G formula based on SCr of 2 mg/dL (H)). Liver Function Tests: Recent Labs  Lab 08/18/19 1511  AST 18  ALT 17  ALKPHOS 81  BILITOT 0.9  PROT 6.6  ALBUMIN 3.5   No results for input(s): LIPASE, AMYLASE in the last 168 hours. No results for input(s): AMMONIA in the last 168 hours. Coagulation Profile: Recent Labs  Lab 08/18/19 1511  INR 1.4*   Cardiac Enzymes: No results for input(s): CKTOTAL, CKMB, CKMBINDEX, TROPONINI in the last 168 hours. BNP (last 3 results) No results for input(s): PROBNP in the last  8760 hours. HbA1C: No results for input(s): HGBA1C in the last 72 hours. CBG: Recent Labs  Lab 08/20/19 2205 08/21/19 0756 08/21/19 1111 08/21/19 1622 08/21/19 2106  GLUCAP 117* 155* 141* 106* 127*   Lipid Profile: No results for input(s): CHOL, HDL, LDLCALC, TRIG, CHOLHDL, LDLDIRECT in the last 72 hours. Thyroid Function Tests: Recent Labs    08/21/19 0754  TSH 1.720   Anemia Panel: Recent Labs    08/21/19 0754  VITAMINB12 215  FOLATE 15.8  FERRITIN 47  TIBC 301  IRON 39  RETICCTPCT 3.4*   Urine analysis:    Component Value Date/Time   COLORURINE STRAW (A) 01/28/2018 [place] 01/28/2018 1353   LABSPEC 1.006 01/28/2018 1353   PHURINE 6.0 01/28/2018 1353   GLUCOSEU NEGATIVE 01/28/2018 1353   HGBUR NEGATIVE 01/28/2018 1353   BILIRUBINUR NEGATIVE 01/28/2018 1353   KETONESUR NEGATIVE 01/28/2018 1353   PROTEINUR  NEGATIVE 01/28/2018 1353   UROBILINOGEN 0.2 04/15/2012 1451   NITRITE NEGATIVE 01/28/2018 1353   LEUKOCYTESUR NEGATIVE 01/28/2018 1353   Sepsis Labs: @LABRCNTIP (procalcitonin:4,lacticidven:4)  ) Recent Results (from the past 240 hour(s))  SARS CORONAVIRUS 2 (TAT 6-24 HRS) Nasopharyngeal Nasopharyngeal Swab     Status: None   Collection Time: 08/18/19  4:33 PM   Specimen: Nasopharyngeal Swab  Result Value Ref Range Status   SARS Coronavirus 2 NEGATIVE NEGATIVE Final    Comment: (NOTE) SARS-CoV-2 target nucleic acids are NOT DETECTED. The SARS-CoV-2 RNA is generally detectable in upper and lower respiratory specimens during the acute phase of infection. Negative results do not preclude SARS-CoV-2 infection, do not rule out co-infections with other pathogens, and should not be used as the sole basis for treatment or other patient management decisions. Negative results must be combined with clinical observations, patient history, and epidemiological information. The expected result is Negative. Fact Sheet for Patients:  SugarRoll.be Fact Sheet for Healthcare Providers: https://www.woods-mathews.com/ This test is not yet approved or cleared by the [place] FDA and  has been authorized for detection and/or diagnosis of SARS-CoV-2 by FDA under an Emergency Use Authorization (EUA). This EUA will remain  in effect (meaning this test can be used) for the duration of the COVID-19 declaration under Section 56 4(b)(1) of the Act, 21 U.S.C. section 360bbb-3(b)(1), unless the authorization is terminated or revoked sooner. Performed at [facility], [place] [address], [place], [place] [address]       Radiology Studies: No results found.   Scheduled Meds: . amiodarone  200 mg Oral Daily  . atorvastatin  20 mg Oral QHS  . carvedilol  12.5 mg Oral BID  . feeding supplement (ENSURE ENLIVE)  237 mL Oral BID BM  . hydrALAZINE  25 mg Oral BID  . insulin aspart  0-15 Units Subcutaneous TID WC  . pantoprazole (PROTONIX) IV  40 mg Intravenous Q12H   Continuous Infusions: . sodium chloride       LOS: 4 days   Time Spent in minutes   30 minutes  Nirav Sweda D.O. on 08/22/2019 at 9:34 AM  Between 7am to 7pm - Please see pager noted on [email or web address]  After 7pm go to [email or web address]  And look for the night coverage person covering for me after hours  [facility] Office  [phone]

## 2019-08-22 NOTE — Anesthesia Preprocedure Evaluation (Signed)
Anesthesia Evaluation  Patient identified by MRN, date of birth, ID band Patient awake    Reviewed: Allergy & Precautions, NPO status , Patient's Chart, lab work & pertinent test results  Airway Mallampati: II  TM Distance: >3 FB Neck ROM: Full    Dental  (+) Edentulous Upper, Edentulous Lower   Pulmonary former smoker,    breath sounds clear to auscultation       Cardiovascular hypertension,  Rhythm:Regular Rate:Normal     Neuro/Psych    GI/Hepatic   Endo/Other  diabetes  Renal/GU      Musculoskeletal   Abdominal   Peds  Hematology   Anesthesia Other Findings   Reproductive/Obstetrics                             Anesthesia Physical Anesthesia Plan  ASA: III  Anesthesia Plan: MAC   Post-op Pain Management:    Induction: Intravenous  PONV Risk Score and Plan: Propofol infusion  Airway Management Planned: Simple Face Mask  Additional Equipment:   Intra-op Plan:   Post-operative Plan:   Informed Consent: I have reviewed the patients History and Physical, chart, labs and discussed the procedure including the risks, benefits and alternatives for the proposed anesthesia with the patient or authorized representative who has indicated his/her understanding and acceptance.       Plan Discussed with: CRNA and Anesthesiologist  Anesthesia Plan Comments:         Anesthesia Quick Evaluation

## 2019-08-22 NOTE — Progress Notes (Signed)
Pt had moderate sized polyp removed from the cecum with some bleeding controlled with endoscopic clips. Will need to hold Eliquis for at least 5 days. Started back on clears, continue miralax

## 2019-08-23 LAB — BASIC METABOLIC PANEL
Anion gap: 9 (ref 5–15)
BUN: 16 mg/dL (ref 8–23)
CO2: 21 mmol/L — ABNORMAL LOW (ref 22–32)
Calcium: 9 mg/dL (ref 8.9–10.3)
Chloride: 107 mmol/L (ref 98–111)
Creatinine, Ser: 1.86 mg/dL — ABNORMAL HIGH (ref 0.44–1.00)
GFR calc Af Amer: 29 mL/min — ABNORMAL LOW (ref >60)
GFR calc non Af Amer: 25 mL/min — ABNORMAL LOW (ref >60)
Glucose, Bld: 99 mg/dL (ref 70–99)
Potassium: 3.5 mmol/L (ref 3.5–5.1)
Sodium: 137 mmol/L (ref 135–145)

## 2019-08-23 LAB — GLUCOSE, CAPILLARY
Glucose-Capillary: 113 mg/dL — ABNORMAL HIGH (ref 70–99)
Glucose-Capillary: 170 mg/dL — ABNORMAL HIGH (ref 70–99)
Glucose-Capillary: 132 mg/dL — ABNORMAL HIGH (ref 70–99)
Glucose-Capillary: 130 mg/dL — ABNORMAL HIGH (ref 70–99)

## 2019-08-23 LAB — HEMOGLOBIN AND HEMATOCRIT, BLOOD
HCT: 31 % — ABNORMAL LOW (ref 36.0–46.0)
Hemoglobin: 10 g/dL — ABNORMAL LOW (ref 12.0–15.0)

## 2019-08-23 MED ORDER — PNEUMOCOCCAL VAC POLYVALENT 25 MCG/0.5ML IJ INJ
0.5000 mL | INJECTION | INTRAMUSCULAR | Status: AC
  Administered 2019-08-25: 0.5 mL via INTRAMUSCULAR
  Filled 2019-08-23: qty 0.5

## 2019-08-23 MED ORDER — POLYETHYLENE GLYCOL 3350 17 G PO PACK
17.0000 g | PACK | Freq: Every day | ORAL | Status: DC
  Administered 2019-08-23 – 2019-08-25 (×3): 17 g via ORAL
  Filled 2019-08-23 (×3): qty 1

## 2019-08-23 MED ORDER — CYANOCOBALAMIN 1000 MCG/ML IJ SOLN
1000.0000 ug | Freq: Once | INTRAMUSCULAR | Status: AC
  Administered 2019-08-23: 1000 ug via INTRAMUSCULAR
  Filled 2019-08-23: qty 1

## 2019-08-23 MED ORDER — POLYETHYLENE GLYCOL 3350 17 G PO PACK: 17.0000 g | PACK | Freq: Three times a day (TID) | ORAL | Status: DC

## 2019-08-23 NOTE — Progress Notes (Signed)
PROGRESS NOTE    Krista Mason  LNL:[id number] DOB: 10/14/1936 DOA: 08/18/2019 PCP: Leighton Ruff, MD   Brief Narrative:  HPI On on 08/18/2019 by Dr. Revonda Standard is a 83 y.o. female with medical history significant of CHF, s/p PPM placement 3 weeks ago, A. Fib started on Eliquis 1 weeks ago for stroke prevention 2/2 CHA2DS2-VASc score of 6, started 1 week ago on Amiodarone. Last echo 3/12 with EF 35%. She is followed in the a.fib and CHF clinics. She has chronic esophageal stricture and is s/p dilatation x 5 last 10/29/2018. She has no h/o PUD but does take PPI bid. She has chronic SOB for months unimproved after PPM. Over the last 24-48 hours she has felt weak, light-headed with continued, but not worsening, SOB. She has had several episodes of dark black tarry stools. Due to her symptoms she presents to [facility].   Interim history Admitted with GI bleed. Gastroenterology consulted s/p EGD and Colonoscopy. Cardiology also consulted for CHF, SOB. Pending possible repeat dilatation prior to discharge.  Assessment & Plan   UGI Bleed/ Acute normocytic anemia -Patient states she has had dark stools for some time.  She was started on Eliquis 1 week ago. -Of note, patient has had a history of esophageal stricture and required dilatation.  Patient did have EGD noted in December 2019 as well as January 2020 -FOBT positive -Hemoglobin down to 8.6- pending hemoglobin this morning. Was over 12 in August 2020 -was on a protonix drip, however now transitioned to protonix IV BID -Gastroenterology consulted and appreciated, s/p EGD: LA Grade D esophagitis, biopsied.  Benign-appearing esophageal stenosis, dilated.  -s/p colonoscopy: Cecal polyp which was removed, some bleeding- 2 clips were placed. Extensive severe diverticulosis. -Anemia panel showed adequate iron and storage.  -Gastroenterology recommended holding Eliquis for at least 5 days. Possible repeat dilatation prior to discharge.    -advance to full liquids today  Chronic systolic and diastolic heart failure -Currently appears to be euvolemic and stable -Last echocardiogram 01/01/2019 showed an EF of 35%, LV Doppler parameters consistent with impaired relaxation -Continue Coreg. Lasix held as patient euvolemic and has orthostatic hypotension  Paroxysmal atrial fibrillation -CHADSVASC 6 -Recently placed on Eliquis approximately 1 week ago, which is now been held in the setting of GI bleed -Continue amiodarone, coreg  Diabetes mellitus, type II -Appears to be diet controlled as patient is not on any home medications -Last hemoglobin A1c 6.9 on 06/11/2019 -Continue sliding scale with CBG monitoring  Chronic kidney disease, stage IV -Creatinine currently 1.8, stable, continue to monitor BMP  Orthostatic hypotension -BP does drop with change in position -lasix held -Decreased hydralazine dose to 25mg  BID -Continue compression stockings -will order repeat orthostatic vitals today  Essential hypertension -Continue Coreg, hydralazine  Goals of care/CODE STATUS -Had [place] discussion with patient regarding her CODE STATUS.  Has opted to become DO NOT RESUSCITATE.  Fatigue -Possibly multifactorial including anemia versus deconditioning versus her history of CHF and A. fib -Vitamin D 50.31, TSH 1.720 (WNL) -PT and OT rec SNF- however patient thinking about HH. CM and SW consulted. -Though Vitamin B12 is WNL, 215, will supplement  DVT Prophylaxis  SCDs  Code Status: DNR  Family Communication: None at bedside  Disposition Plan: Admitted.  Dispo TBD  Consultants Cardiology Gastroenterology  Procedures  EGD Colonoscopy  Antibiotics   Anti-infectives (From admission, onward)   None      Subjective:   Krista Mason seen and examined today.  Feels tired  this morning. Denies further dark stools. Denies chest pain, shortness of breath, abdominal pain, N/V, dizziness, headache.  Objective:   Vitals:    08/22/19 0936 08/22/19 1038 08/22/19 2029 08/23/19 0523  BP: (!) 173/66 (!) 157/55 (!) 148/62 (!) 164/55  Pulse:   68 70  Resp:   19 18  Temp:   98.2 F (36.8 C) 98.3 F (36.8 C)  TempSrc:   Oral Oral  SpO2:   98% 96%  Weight:    67.2 kg  Height:        Intake/Output Summary (Last 24 hours) at 08/23/2019 0839 Last data filed at 08/22/2019 1600 Gross per 24 hour  Intake 712 ml  Output --  Net 712 ml   Filed Weights   08/22/19 0553 08/22/19 0729 08/23/19 0523  Weight: 67.9 kg 67.9 kg 67.2 kg   Exam (no change from 08/22/19)  General: Well developed, chronically ill-appearing, NAD  HEENT: NCAT, mucous membranes moist.   Cardiovascular: S1 S2 auscultated, RRR, no murmur  Respiratory: Clear to auscultation bilaterally   Abdomen: Soft, nontender, nondistended, + bowel sounds  Extremities: warm dry without cyanosis clubbing or edema  Neuro: AAOx3, nonfocal  Psych: Pleasant, appropriate mood and affect    Data Reviewed: I have personally reviewed following labs and imaging studies  CBC: Recent Labs  Lab 08/18/19 1511 08/19/19 0510 08/19/19 1558 08/20/19 0417 08/21/19 0339 08/22/19 0451  WBC 7.3  --   --  6.1 6.8  --   NEUTROABS 5.6  --   --   --   --   --   HGB 10.5* 10.0* 9.6* 9.1* 8.6* 8.6*  HCT 31.4* 31.1* 30.2* 27.8* 27.0* 26.7*  MCV 89.2  --   --  90.0 91.8  --   PLT 229  --   --  199 194  --    Basic Metabolic Panel: Recent Labs  Lab 08/18/19 1511 08/20/19 0417 08/21/19 0339 08/22/19 0451 08/23/19 0353  NA 136 137 137 137 137  K 3.4* 3.8 3.5 3.9 3.5  CL 99 105 106 105 107  CO2 25 24 20* 20* 21*  GLUCOSE 171* 118* 93 108* 99  BUN 33* 26* 26* 24* 16  CREATININE 2.00* 2.11* 2.10* 2.00* 1.86*  CALCIUM 9.4 9.0 9.1 9.2 9.0   GFR: Estimated Creatinine Clearance: 22.4 mL/min (A) (by C-G formula based on SCr of 1.86 mg/dL (H)). Liver Function Tests: Recent Labs  Lab 08/18/19 1511  AST 18  ALT 17  ALKPHOS 81  BILITOT 0.9  PROT 6.6  ALBUMIN  3.5   No results for input(s): LIPASE, AMYLASE in the last 168 hours. No results for input(s): AMMONIA in the last 168 hours. Coagulation Profile: Recent Labs  Lab 08/18/19 1511  INR 1.4*   Cardiac Enzymes: No results for input(s): CKTOTAL, CKMB, CKMBINDEX, TROPONINI in the last 168 hours. BNP (last 3 results) No results for input(s): PROBNP in the last 8760 hours. HbA1C: No results for input(s): HGBA1C in the last 72 hours. CBG: Recent Labs  Lab 08/21/19 2106 08/22/19 1150 08/22/19 1643 08/22/19 2113 08/23/19 0758  GLUCAP 127* 128* 84 113* 113*   Lipid Profile: No results for input(s): CHOL, HDL, LDLCALC, TRIG, CHOLHDL, LDLDIRECT in the last 72 hours. Thyroid Function Tests: Recent Labs    08/21/19 0754  TSH 1.720   Anemia Panel: Recent Labs    08/21/19 0754  VITAMINB12 215  FOLATE 15.8  FERRITIN 47  TIBC 301  IRON 39  RETICCTPCT 3.4*   Urine  analysis:    Component Value Date/Time   COLORURINE STRAW (A) 01/28/2018 1353   APPEARANCEUR CLEAR 01/28/2018 1353   LABSPEC 1.006 01/28/2018 1353   PHURINE 6.0 01/28/2018 1353   GLUCOSEU NEGATIVE 01/28/2018 1353   HGBUR NEGATIVE 01/28/2018 1353   BILIRUBINUR NEGATIVE 01/28/2018 1353   KETONESUR NEGATIVE 01/28/2018 1353   PROTEINUR NEGATIVE 01/28/2018 1353   UROBILINOGEN 0.2 04/15/2012 1451   NITRITE NEGATIVE 01/28/2018 1353   LEUKOCYTESUR NEGATIVE 01/28/2018 1353   Sepsis Labs: @LABRCNTIP (procalcitonin:4,lacticidven:4)  ) Recent Results (from the past 240 hour(s))  SARS CORONAVIRUS 2 (TAT 6-24 HRS) Nasopharyngeal Nasopharyngeal Swab     Status: None   Collection Time: 08/18/19  4:33 PM   Specimen: Nasopharyngeal Swab  Result Value Ref Range Status   SARS Coronavirus 2 NEGATIVE NEGATIVE Final    Comment: (NOTE) SARS-CoV-2 target nucleic acids are NOT DETECTED. The SARS-CoV-2 RNA is generally detectable in upper and lower respiratory specimens during the acute phase of infection. Negative results do not  preclude SARS-CoV-2 infection, do not rule out co-infections with other pathogens, and should not be used as the sole basis for treatment or other patient management decisions. Negative results must be combined with clinical observations, patient history, and epidemiological information. The expected result is Negative. Fact Sheet for Patients: SugarRoll.be Fact Sheet for Healthcare Providers: https://www.woods-mathews.com/ This test is not yet approved or cleared by the [place] FDA and  has been authorized for detection and/or diagnosis of SARS-CoV-2 by FDA under an Emergency Use Authorization (EUA). This EUA will remain  in effect (meaning this test can be used) for the duration of the COVID-19 declaration under Section 56 4(b)(1) of the Act, 21 U.S.C. section 360bbb-3(b)(1), unless the authorization is terminated or revoked sooner. Performed at [facility], [place] [address] [place], [place] [address]       Radiology Studies: No results found.   Scheduled Meds:  amiodarone  200 mg Oral Daily   atorvastatin  20 mg Oral QHS   carvedilol  12.5 mg Oral BID   cyanocobalamin  1,000 mcg Intramuscular Once   feeding supplement (ENSURE ENLIVE)  237 mL Oral BID BM   hydrALAZINE  25 mg Oral BID   insulin aspart  0-15 Units Subcutaneous TID WC   pantoprazole (PROTONIX) IV  40 mg Intravenous Q12H   [START ON 08/24/2019] pneumococcal 23 valent vaccine  0.5 mL Intramuscular Tomorrow-1000   polyethylene glycol  17 g Oral Daily   Continuous Infusions:  sodium chloride       LOS: 5 days   Time Spent in minutes   30 minutes  [employer] D.O. on 08/23/2019 at 8:39 AM  Between 7am to 7pm - Please see pager noted on [email or web address]  After 7pm go to [email or web address]  And look for the night coverage person covering for me after hours  [facility] Office  [phone]

## 2019-08-23 NOTE — TOC Initial Note (Signed)
Transition of Care [facility]) - Initial/Assessment Note    Patient Details  Name: Krista Mason MRN: [id number] Date of Birth: [birth year]  Transition of Care [facility]) CM/SW Contact:    Bary Castilla, LCSW Phone Number: [phone] 08/23/2019, 3:53 PM  Clinical Narrative:                  CSW met with patient to discuss the recommendation of a SNF. Patient was aware of recommendation as in agreement with recommendation. CSW had a lengthy conversation about the SNF process.   CSW explained that placement options may be limited due to patient's insurance. Patient provided CSW with a preference list of facilities and is in agreement to other facilities as well. CSW reviewed and discussed the medicare.gov rating list.   TOC team will continue to follow for discharge planning needs.  Expected Discharge Plan: Skilled Nursing Facility Barriers to Discharge: Continued Medical Work up, SNF Pending bed offer     Patient Goals and CMS Choice Patient states their goals for this hospitalization and ongoing recovery are:: To be independent CMS Medicare.gov Compare Post Acute Care list provided to:: Patient    Expected Discharge Plan and Services Expected Discharge Plan: [place]       Living arrangements for the past 2 months: [place]                                      Prior Living Arrangements/Services Living arrangements for the past 2 months: [place] Lives with:: Self Patient language and need for interpreter reviewed:: Yes Do you feel safe going back to the place where you live?: Yes      Need for Family Participation in Patient Care: Yes (Comment) Care giver support system in place?: Yes (comment)      Activities of Daily Living Home Assistive Devices/Equipment: Walker (specify type) ADL Screening (condition at time of admission) Patient's cognitive ability adequate to safely complete daily activities?: Yes Is the  patient deaf or have difficulty hearing?: No Does the patient have difficulty seeing, even when wearing glasses/contacts?: No Does the patient have difficulty concentrating, remembering, or making decisions?: No Patient able to express need for assistance with ADLs?: Yes Does the patient have difficulty dressing or bathing?: No Independently performs ADLs?: Yes (appropriate for developmental age) Does the patient have difficulty walking or climbing stairs?: Yes Weakness of Legs: Both Weakness of Arms/Hands: None  Permission Sought/Granted   Permission granted to share information with : Yes, Verbal Permission Granted  Share Information with NAME: Dianah Field  Permission granted to share info w AGENCY: SNfs  Permission granted to share info w Relationship: Daughter  Permission granted to share info w Contact Information: [phone]  Emotional Assessment Appearance:: Appears stated age Attitude/Demeanor/Rapport: Engaged, Self-Confident Affect (typically observed): Accepting, Adaptable Orientation: : Oriented to Self, Oriented to Place, Oriented to  Time, Oriented to Situation      Admission diagnosis:  Pacemaker [Z95.0] Current use of Baez term anticoagulation [Z79.01] Gastrointestinal hemorrhage with melena [K92.1] Patient Active Problem List   Diagnosis Date Noted  . Gastrointestinal hemorrhage with melena 08/18/2019  . UGI bleed 08/18/2019  . Nonischemic cardiomyopathy ([place]) 07/24/2019  . Acute hypoxemic respiratory failure ([place])   . COPD  GOLD 0  01/17/2017  . Chronic systolic CHF (congestive heart failure) ([place]) 11/13/2016  . PVC (premature ventricular contraction) 11/13/2016  . Mixed  hyperlipidemia 11/05/2016  . CKD (chronic kidney disease), stage III ([place]) 11/05/2016  . Tobacco abuse 11/05/2016  . Multifocal PVCs 11/05/2016  . Moderate mitral regurgitation 11/05/2016  . Microcytic anemia 11/05/2016  . Diabetes mellitus with complication ([place])   . Uncontrolled  hypertension 06/18/2016  . Multifocal atrial tachycardia ([place]) 11/26/2014  . PVD (peripheral vascular disease) ([place]) 12/15/2012   PCP:  Leighton Ruff, MD Pharmacy:   [id number] [place]: [phone]: [phone] [facility] - [address] [place] [place]Phone: [phone]: [phone]    Social Determinants of Health (SDOH) Interventions    Readmission Risk Interventions No flowsheet data found.

## 2019-08-23 NOTE — NC FL2 (Deleted)
[place] MEDICAID FL2 LEVEL OF CARE SCREENING TOOL     IDENTIFICATION  Patient Name: Krista Mason Birthdate: 1935/11/07 Sex: female Admission Date (Current Location): 08/18/2019  [facility] and [place] Number:  Herbalist and Address:  [facility] [place]. [facility], [place] [address] [place], [place] [address]      Provider Number: [id number]  Attending Physician Name and Address:  Cristal Ford, DO  Relative Name and Phone Number:  [id number] [phone]    Current Level of Care: Hospital Recommended Level of Care: [place] Prior Approval Number:    Date Approved/Denied:   PASRR Number: [id number]  Discharge Plan: SNF    Current Diagnoses: Patient Active Problem List   Diagnosis Date Noted  . Gastrointestinal hemorrhage with melena 08/18/2019  . UGI bleed 08/18/2019  . Nonischemic cardiomyopathy ([place]) 07/24/2019  . Acute hypoxemic respiratory failure ([place])   . COPD  GOLD 0  01/17/2017  . Chronic systolic CHF (congestive heart failure) ([place]) 11/13/2016  . PVC (premature ventricular contraction) 11/13/2016  . Mixed hyperlipidemia 11/05/2016  . CKD (chronic kidney disease), stage III ([place]) 11/05/2016  . Tobacco abuse 11/05/2016  . Multifocal PVCs 11/05/2016  . Moderate mitral regurgitation 11/05/2016  . Microcytic anemia 11/05/2016  . Diabetes mellitus with complication ([place])   . Uncontrolled hypertension 06/18/2016  . Multifocal atrial tachycardia ([place]) 11/26/2014  . PVD (peripheral vascular disease) ([place]) 12/15/2012    Orientation RESPIRATION BLADDER Height & Weight     Self, Time, Situation, Place  Normal Continent Weight: 148 lb 3.2 oz (67.2 kg) Height:  5\' 4"  (162.6 cm)  BEHAVIORAL SYMPTOMS/MOOD NEUROLOGICAL BOWEL NUTRITION STATUS      Continent    AMBULATORY STATUS COMMUNICATION OF NEEDS Skin   Limited Assist Verbally Other (Comment)(Rash back)                       Personal Care Assistance Level of Assistance   Bathing, Feeding, Dressing, Total care Bathing Assistance: Limited assistance Feeding assistance: Independent Dressing Assistance: Limited assistance Total Care Assistance: Limited assistance   Functional Limitations Info  Sight, Hearing, Speech Sight Info: Adequate Hearing Info: Adequate Speech Info: Adequate    SPECIAL CARE FACTORS FREQUENCY  PT (By licensed PT), OT (By licensed OT)     PT Frequency: 5x per week OT Frequency: 5x per week            Contractures Contractures Info: Not present    Additional Factors Info  Code Status, Allergies Code Status Info: DNR Allergies Info: Penicillins,Ceclor,Epinephrine,Influenza Vaccines,Nsaids,Percocet ,Prednisone           Current Medications (08/23/2019):  This is the current hospital active medication list Current Facility-Administered Medications  Medication Dose Route Frequency Provider Last Rate Last Dose  . 0.9 %  sodium chloride infusion   Intravenous Continuous Laurence Spates, MD      . acetaminophen (TYLENOL) tablet 650 mg  650 mg Oral Q6H PRN Laurence Spates, MD       Or  . acetaminophen (TYLENOL) suppository 650 mg  650 mg Rectal Q6H PRN Laurence Spates, MD      . amiodarone (PACERONE) tablet 200 mg  200 mg Oral Daily Laurence Spates, MD   200 mg at 08/23/19 0842  . atorvastatin (LIPITOR) tablet 20 mg  20 mg Oral Madolyn Frieze, MD   20 mg at 08/22/19 2148  . carvedilol (COREG) tablet 12.5 mg  12.5 mg Oral BID Laurence Spates, MD  12.5 mg at 08/23/19 0842  . feeding supplement (ENSURE ENLIVE) (ENSURE ENLIVE) liquid 237 mL  237 mL Oral BID BM Laurence Spates, MD   237 mL at 08/23/19 1505  . hydrALAZINE (APRESOLINE) tablet 25 mg  25 mg Oral BID Laurence Spates, MD   25 mg at 08/23/19 0842  . insulin aspart (novoLOG) injection 0-15 Units  0-15 Units Subcutaneous TID WC Laurence Spates, MD   3 Units at 08/23/19 1221  . pantoprazole (PROTONIX) injection 40 mg  40 mg Intravenous Lance Coon, MD   40 mg at 08/23/19  0842  . [START ON 08/24/2019] pneumococcal 23 valent vaccine (PNU-IMMUNE) injection 0.5 mL  0.5 mL Intramuscular Tomorrow-1000 Mikhail, [place], DO      . polyethylene glycol (MIRALAX / GLYCOLAX) packet 17 g  17 g Oral Daily Laurence Spates, MD   17 g at 08/23/19 0844  . polyvinyl alcohol (LIQUIFILM TEARS) 1.4 % ophthalmic solution 1 drop  1 drop Both Eyes BID PRN Laurence Spates, MD      . traZODone (DESYREL) tablet 25 mg  25 mg Oral QHS PRN Laurence Spates, MD         Discharge Medications: Please see discharge summary for a list of discharge medications.  Relevant Imaging Results:  Relevant Lab Results:   Additional Information SS# [id number]  Bary Castilla, LCSW

## 2019-08-23 NOTE — Progress Notes (Signed)
[facility] PROGRESS NOTE Subjective Patient denies pain has had no further gross bleeding 1 small passage of liquid brown stool with gas.  Tolerating clear liquids  Objective: Vital signs in last 24 hours: Temp:  [95.8 F (35.4 C)-98.3 F (36.8 C)] 98.3 F (36.8 C) (11/01 0523) Pulse Rate:  [68-70] 70 (11/01 0523) Resp:  [18-22] 18 (11/01 0523) BP: (148-173)/(34-66) 164/55 (11/01 0523) SpO2:  [96 %-100 %] 96 % (11/01 0523) Weight:  [67.2 kg] 67.2 kg (11/01 0523) Last BM Date: 08/21/19  Intake/Output from previous day: 10/31 0701 - 11/01 0700 In: 712 [P.O.:412; I.V.:300] Out: -  Intake/Output this shift: No intake/output data recorded.  PE: General--sitting in bed no distress- Abdomen--nondistended soft increased bowel sounds  Lab Results: Recent Labs    08/21/19 0339 08/22/19 0451  WBC 6.8  --   HGB 8.6* 8.6*  HCT 27.0* 26.7*  PLT 194  --    BMET Recent Labs    08/21/19 0339 08/22/19 0451 08/23/19 0353  NA 137 137 137  K 3.5 3.9 3.5  CL 106 105 107  CO2 20* 20* 21*  CREATININE 2.10* 2.00* 1.86*   LFT No results for input(s): PROT, AST, ALT, ALKPHOS, BILITOT, BILIDIR, IBILI in the last 72 hours. PT/INR No results for input(s): LABPROT, INR in the last 72 hours. PANCREAS No results for input(s): LIPASE in the last 72 hours.       Studies/Results: No results found.  Medications: I have reviewed the patient's current medications.  Assessment:   1.  GI bleeding.  This occurred in the situation of anticoagulation and EGD showed ulcerative esophagitis that did not have any active bleeding.  Colonoscopy showed diverticulosis and a polyp that was removed.  Suspect that she was bleeding from the esophagitis but currently seems to be stable.  With removal of the polyp and some slight post polypectomy bleeding requiring placement of clips, will need to be off of anticoagulation for 5 days at least. 2.  Cecal polyp.  Was removed with some bleeding 2  clips were placed no obvious problems at this point 3.  Extensive severe diverticulosis.  Have discussed with her the need to take MiraLAX on a prophylactic basis 4.  Esophageal stricture-esophagitis.  The patient notes that she was having lots of problems swallowing solids and particularly pills which would stick in her substernal area.  Suspect this was the cause of her esophagitis.  She seems to be doing fine now.  She was dilated to 11 mm.   Plan: 1.  We will need to remain off of anticoagulation until totally clear that she is having no further bleeding 2.  We will need to take MiraLAX with dose adjusted indefinitely due to severe diverticular disease and constipation 3.  Would consider repeat dilatation prior to discharge since she will need to be off of anticoagulation for the next several days anyway. 4.  We will advance to full liquids   Nancy Fetter 08/23/2019, 7:32 AM  This note was created using voice recognition software. Minor errors may Have occurred unintentionally.  Pager: [phone] If no answer or after hours call [phone]

## 2019-08-23 NOTE — NC FL2 (Signed)
[place] MEDICAID FL2 LEVEL OF CARE SCREENING TOOL     IDENTIFICATION  Patient Name: Krista Mason Birthdate: [birth year] Sex: female Admission Date (Current Location): 08/18/2019  [facility] and [place] Number:  Herbalist and Address:  [facility] [place]. [facility], [place] [address] [place], [place] [address]      Provider Number: [id number]  Attending Physician Name and Address:  Cristal Ford, DO  Relative Name and Phone Number:  KYHCW [phone]    Current Level of Care: Hospital Recommended Level of Care: [place] Prior Approval Number:    Date Approved/Denied:   PASRR Number: [id number]  Discharge Plan: SNF    Current Diagnoses: Patient Active Problem List   Diagnosis Date Noted  . Gastrointestinal hemorrhage with melena 08/18/2019  . UGI bleed 08/18/2019  . Nonischemic cardiomyopathy ([place]) 07/24/2019  . Acute hypoxemic respiratory failure ([place])   . COPD  GOLD 0  01/17/2017  . Chronic systolic CHF (congestive heart failure) ([place]) 11/13/2016  . PVC (premature ventricular contraction) 11/13/2016  . Mixed hyperlipidemia 11/05/2016  . CKD (chronic kidney disease), stage III ([place]) 11/05/2016  . Tobacco abuse 11/05/2016  . Multifocal PVCs 11/05/2016  . Moderate mitral regurgitation 11/05/2016  . Microcytic anemia 11/05/2016  . Diabetes mellitus with complication ([place])   . Uncontrolled hypertension 06/18/2016  . Multifocal atrial tachycardia ([place]) 11/26/2014  . PVD (peripheral vascular disease) ([place]) 12/15/2012    Orientation RESPIRATION BLADDER Height & Weight     Self, Time, Situation, Place  Normal Continent Weight: 148 lb 3.2 oz (67.2 kg) Height:  5\' 4"  (162.6 cm)  BEHAVIORAL SYMPTOMS/MOOD NEUROLOGICAL BOWEL NUTRITION STATUS      Continent    AMBULATORY STATUS COMMUNICATION OF NEEDS Skin   Limited Assist Verbally Other (Comment)(Rash back)                       Personal Care Assistance Level of Assistance   Bathing, Feeding, Dressing, Total care Bathing Assistance: Limited assistance Feeding assistance: Independent Dressing Assistance: Limited assistance Total Care Assistance: Limited assistance   Functional Limitations Info  Sight, Hearing, Speech Sight Info: Adequate Hearing Info: Adequate Speech Info: Adequate    SPECIAL CARE FACTORS FREQUENCY  PT (By licensed PT), OT (By licensed OT)     PT Frequency: 5x per week OT Frequency: 5x per week            Contractures Contractures Info: Not present    Additional Factors Info  Insulin Sliding Scale Code Status Info: DNR Allergies Info: Penicillins,Ceclor,Epinephrine,Influenza Vaccines,Nsaids,Percocet ,Prednisone   Insulin Sliding Scale Info: insulin aspart (novoLOG) injection 0-15 Units 0-15 Units 3 times daily with meals       Current Medications (08/23/2019):  This is the current hospital active medication list Current Facility-Administered Medications  Medication Dose Route Frequency Provider Last Rate Last Dose  . 0.9 %  sodium chloride infusion   Intravenous Continuous Laurence Spates, MD      . acetaminophen (TYLENOL) tablet 650 mg  650 mg Oral Q6H PRN Laurence Spates, MD       Or  . acetaminophen (TYLENOL) suppository 650 mg  650 mg Rectal Q6H PRN Laurence Spates, MD      . amiodarone (PACERONE) tablet 200 mg  200 mg Oral Daily Laurence Spates, MD   200 mg at 08/23/19 0842  . atorvastatin (LIPITOR) tablet 20 mg  20 mg Oral Madolyn Frieze, MD   20 mg at 08/22/19 2148  .  carvedilol (COREG) tablet 12.5 mg  12.5 mg Oral BID Laurence Spates, MD   12.5 mg at 08/23/19 0842  . feeding supplement (ENSURE ENLIVE) (ENSURE ENLIVE) liquid 237 mL  237 mL Oral BID BM Laurence Spates, MD   237 mL at 08/23/19 1505  . hydrALAZINE (APRESOLINE) tablet 25 mg  25 mg Oral BID Laurence Spates, MD   25 mg at 08/23/19 0842  . insulin aspart (novoLOG) injection 0-15 Units  0-15 Units Subcutaneous TID WC Laurence Spates, MD   3 Units at 08/23/19 1221   . pantoprazole (PROTONIX) injection 40 mg  40 mg Intravenous Lance Coon, MD   40 mg at 08/23/19 0842  . [START ON 08/24/2019] pneumococcal 23 valent vaccine (PNU-IMMUNE) injection 0.5 mL  0.5 mL Intramuscular Tomorrow-1000 Mikhail, [place], DO      . polyethylene glycol (MIRALAX / GLYCOLAX) packet 17 g  17 g Oral Daily Laurence Spates, MD   17 g at 08/23/19 0844  . polyvinyl alcohol (LIQUIFILM TEARS) 1.4 % ophthalmic solution 1 drop  1 drop Both Eyes BID PRN Laurence Spates, MD      . traZODone (DESYREL) tablet 25 mg  25 mg Oral QHS PRN Laurence Spates, MD         Discharge Medications: Please see discharge summary for a list of discharge medications.  Relevant Imaging Results:  Relevant Lab Results:   Additional Information SS# [id number]  Bary Castilla, LCSW

## 2019-08-24 ENCOUNTER — Encounter (HOSPITAL_COMMUNITY): Payer: Self-pay | Admitting: Gastroenterology

## 2019-08-24 LAB — BASIC METABOLIC PANEL
Anion gap: 10 (ref 5–15)
BUN: 21 mg/dL (ref 8–23)
CO2: 22 mmol/L (ref 22–32)
Calcium: 9.2 mg/dL (ref 8.9–10.3)
Chloride: 103 mmol/L (ref 98–111)
Creatinine, Ser: 2 mg/dL — ABNORMAL HIGH (ref 0.44–1.00)
GFR calc Af Amer: 26 mL/min — ABNORMAL LOW (ref >60)
GFR calc non Af Amer: 23 mL/min — ABNORMAL LOW (ref >60)
Glucose, Bld: 128 mg/dL — ABNORMAL HIGH (ref 70–99)
Potassium: 4 mmol/L (ref 3.5–5.1)
Sodium: 135 mmol/L (ref 135–145)

## 2019-08-24 LAB — GLUCOSE, CAPILLARY
Glucose-Capillary: 138 mg/dL — ABNORMAL HIGH (ref 70–99)
Glucose-Capillary: 115 mg/dL — ABNORMAL HIGH (ref 70–99)
Glucose-Capillary: 153 mg/dL — ABNORMAL HIGH (ref 70–99)
Glucose-Capillary: 138 mg/dL — ABNORMAL HIGH (ref 70–99)

## 2019-08-24 LAB — HEMOGLOBIN AND HEMATOCRIT, BLOOD
HCT: 27.9 % — ABNORMAL LOW (ref 36.0–46.0)
Hemoglobin: 9 g/dL — ABNORMAL LOW (ref 12.0–15.0)

## 2019-08-24 MED ORDER — ALUM & MAG HYDROXIDE-SIMETH 200-200-20 MG/5ML PO SUSP
30.0000 mL | ORAL | Status: DC | PRN
  Administered 2019-08-24: 30 mL via ORAL

## 2019-08-24 MED ORDER — SUCRALFATE 1 GM/10ML PO SUSP
1.0000 g | Freq: Three times a day (TID) | ORAL | Status: DC
  Administered 2019-08-24 – 2019-08-25 (×5): 1 g via ORAL
  Filled 2019-08-24 (×5): qty 10

## 2019-08-24 NOTE — Progress Notes (Addendum)
PROGRESS NOTE    Krista Mason  KCL:[id number] DOB: [birth year] DOA: 08/18/2019 PCP: Leighton Ruff, MD   Brief Narrative:  HPI On on 08/18/2019 by Dr. Revonda Standard is a 83 y.o. female with medical history significant of CHF, s/p PPM placement 3 weeks ago, A. Fib started on Eliquis 1 weeks ago for stroke prevention 2/2 CHA2DS2-VASc score of 6, started 1 week ago on Amiodarone. Last echo 3/12 with EF 35%. She is followed in the a.fib and CHF clinics. She has chronic esophageal stricture and is s/p dilatation x 5 last 10/29/2018. She has no h/o PUD but does take PPI bid. She has chronic SOB for months unimproved after PPM. Over the last 24-48 hours she has felt weak, light-headed with continued, but not worsening, SOB. She has had several episodes of dark black tarry stools. Due to her symptoms she presents to [facility].   Interim history Admitted with GI bleed. Gastroenterology consulted s/p EGD and Colonoscopy. Cardiology also consulted for CHF, SOB. Pending possible repeat dilatation prior to discharge.  Assessment & Plan   UGI Bleed/ Acute normocytic anemia -Patient states she has had dark stools for some time.  She was started on Eliquis 1 week ago. -Of note, patient has had a history of esophageal stricture and required dilatation.  Patient did have EGD noted in December 2019 as well as January 2020 -FOBT positive -Hemoglobin currently 9. Was over 12 in August 2020 -was on a protonix drip, however now transitioned to protonix IV BID -Gastroenterology consulted and appreciated, s/p EGD: LA Grade D esophagitis, biopsied.  Benign-appearing esophageal stenosis, dilated.  -Biopsy showed chronic esophagitis with associated dyskeratosis. -s/p colonoscopy: Cecal polyp which was removed, some bleeding- 2 clips were placed. Extensive severe diverticulosis. -Anemia panel showed adequate iron and storage.  -Gastroenterology recommended holding Eliquis for at least 5 days.  Now  recommending we evaluate need for EGD if patient is unable to tolerate soft diet.  Would like to hold off on EGD given the risk of esophageal tearing bleeding is higher given her significant inflammation. -Placed on soft diet today  Chronic systolic and diastolic heart failure -Currently appears to be euvolemic and stable -Last echocardiogram 01/01/2019 showed an EF of 35%, LV Doppler parameters consistent with impaired relaxation -Continue Coreg. Lasix held as patient euvolemic and has orthostatic hypotension  Paroxysmal atrial fibrillation -CHADSVASC 6 -Recently placed on Eliquis approximately 1 week ago, which is now been held in the setting of GI bleed -Continue amiodarone, coreg  Diabetes mellitus, type II -Appears to be diet controlled as patient is not on any home medications -Last hemoglobin A1c 6.9 on 06/11/2019 -Continue sliding scale with CBG monitoring  Chronic kidney disease, stage IV -Creatinine currently 2, stable, continue to monitor BMP  Orthostatic hypotension -BP does drop with change in position -lasix held -Decreased hydralazine dose to 25mg  BID -Continue compression stockings, may try abdominal binder -Continues to be orthostatic  Essential hypertension -Continue Coreg, hydralazine  Goals of care/CODE STATUS -Had [place] discussion with patient regarding her CODE STATUS.  Has opted to become DO NOT RESUSCITATE.  Fatigue -Possibly multifactorial including anemia versus deconditioning versus her history of CHF and A. fib -Vitamin D 50.31, TSH 1.720 (WNL) -PT and OT rec SNF- however patient thinking about HH. CM and SW consulted. -Though Vitamin B12 is WNL, 215, will continue to supplement  DVT Prophylaxis  SCDs  Code Status: DNR  Family Communication: None at bedside  Disposition Plan: Admitted.  Advanced to soft diet  and pending further GI recommendations. Dispo SNF. Patient will need repeat COVID test however also pending SNF choice.   Consultants  Cardiology Gastroenterology  Procedures  EGD Colonoscopy  Antibiotics   Anti-infectives (From admission, onward)   None      Subjective:   Krista Mason seen and examined today.  Continues to feel very tired and fatigued this morning.  Denies any further dark stools.  Denies chest pain, shortness of breath, abdominal pain, nausea or vomiting, dizziness or headache. Objective:   Vitals:   08/23/19 0842 08/23/19 2102 08/24/19 0434 08/24/19 0856  BP: (!) 176/74 (!) 128/52 129/64 (!) 161/67  Pulse: 71 74 69 72  Resp:  18 18   Temp:  98.4 F (36.9 C) 98.3 F (36.8 C)   TempSrc:  Oral Oral   SpO2:  98% 97%   Weight:   68 kg   Height:        Intake/Output Summary (Last 24 hours) at 08/24/2019 0920 Last data filed at 08/23/2019 1800 Gross per 24 hour  Intake 957 ml  Output 700 ml  Net 257 ml   Filed Weights   08/22/19 0729 08/23/19 0523 08/24/19 0434  Weight: 67.9 kg 67.2 kg 68 kg   Exam  General: Well developed, chronically ill-appearing, NAD  HEENT: NCAT, mucous membranes moist.   Cardiovascular: S1 S2 auscultated, no rubs, murmurs or gallops. Regular rate and rhythm.  Respiratory: Clear to auscultation bilaterally   Abdomen: Soft, nontender, nondistended, + bowel sounds  Extremities: warm dry without cyanosis clubbing or edema  Neuro: AAOx3, nonfocal  Psych: Normal affect and demeanor   Data Reviewed: I have personally reviewed following labs and imaging studies  CBC: Recent Labs  Lab 08/18/19 1511  08/20/19 0417 08/21/19 0339 08/22/19 0451 08/23/19 1034 08/24/19 0650  WBC 7.3  --  6.1 6.8  --   --   --   NEUTROABS 5.6  --   --   --   --   --   --   HGB 10.5*   < > 9.1* 8.6* 8.6* 10.0* 9.0*  HCT 31.4*   < > 27.8* 27.0* 26.7* 31.0* 27.9*  MCV 89.2  --  90.0 91.8  --   --   --   PLT 229  --  199 194  --   --   --    < > = values in this interval not displayed.   Basic Metabolic Panel: Recent Labs  Lab 08/20/19 0417 08/21/19 0339 08/22/19 0451  08/23/19 0353 08/24/19 0650  NA 137 137 137 137 135  K 3.8 3.5 3.9 3.5 4.0  CL 105 106 105 107 103  CO2 24 20* 20* 21* 22  GLUCOSE 118* 93 108* 99 128*  BUN 26* 26* 24* 16 21  CREATININE 2.11* 2.10* 2.00* 1.86* 2.00*  CALCIUM 9.0 9.1 9.2 9.0 9.2   GFR: Estimated Creatinine Clearance: 20.9 mL/min (A) (by C-G formula based on SCr of 2 mg/dL (H)). Liver Function Tests: Recent Labs  Lab 08/18/19 1511  AST 18  ALT 17  ALKPHOS 81  BILITOT 0.9  PROT 6.6  ALBUMIN 3.5   No results for input(s): LIPASE, AMYLASE in the last 168 hours. No results for input(s): AMMONIA in the last 168 hours. Coagulation Profile: Recent Labs  Lab 08/18/19 1511  INR 1.4*   Cardiac Enzymes: No results for input(s): CKTOTAL, CKMB, CKMBINDEX, TROPONINI in the last 168 hours. BNP (last 3 results) No results for input(s): PROBNP in the last 8760  hours. HbA1C: No results for input(s): HGBA1C in the last 72 hours. CBG: Recent Labs  Lab 08/23/19 0758 08/23/19 1121 08/23/19 1641 08/23/19 2101 08/24/19 0735  GLUCAP 113* 170* 132* 130* 138*   Lipid Profile: No results for input(s): CHOL, HDL, LDLCALC, TRIG, CHOLHDL, LDLDIRECT in the last 72 hours. Thyroid Function Tests: No results for input(s): TSH, T4TOTAL, FREET4, T3FREE, THYROIDAB in the last 72 hours. Anemia Panel: No results for input(s): VITAMINB12, FOLATE, FERRITIN, TIBC, IRON, RETICCTPCT in the last 72 hours. Urine analysis:    Component Value Date/Time   COLORURINE STRAW (A) 01/28/2018 1353   APPEARANCEUR CLEAR 01/28/2018 1353   LABSPEC 1.006 01/28/2018 1353   PHURINE 6.0 01/28/2018 1353   GLUCOSEU NEGATIVE 01/28/2018 1353   HGBUR NEGATIVE 01/28/2018 1353   BILIRUBINUR NEGATIVE 01/28/2018 1353   KETONESUR NEGATIVE 01/28/2018 1353   PROTEINUR NEGATIVE 01/28/2018 1353   UROBILINOGEN 0.2 04/15/2012 1451   NITRITE NEGATIVE 01/28/2018 1353   LEUKOCYTESUR NEGATIVE 01/28/2018 1353   Sepsis Labs:  @[email or web address] (procalcitonin:4,lacticidven:4)  ) Recent Results (from the past 240 hour(s))  SARS CORONAVIRUS 2 (TAT 6-24 HRS) Nasopharyngeal Nasopharyngeal Swab     Status: None   Collection Time: 08/18/19  4:33 PM   Specimen: Nasopharyngeal Swab  Result Value Ref Range Status   SARS Coronavirus 2 NEGATIVE NEGATIVE Final    Comment: (NOTE) SARS-CoV-2 target nucleic acids are NOT DETECTED. The SARS-CoV-2 RNA is generally detectable in upper and lower respiratory specimens during the acute phase of infection. Negative results do not preclude SARS-CoV-2 infection, do not rule out co-infections with other pathogens, and should not be used as the sole basis for treatment or other patient management decisions. Negative results must be combined with clinical observations, patient history, and epidemiological information. The expected result is Negative. Fact Sheet for Patients: SugarRoll.be Fact Sheet for Healthcare Providers: https://www.woods-mathews.com/ This test is not yet approved or cleared by the [place] FDA and  has been authorized for detection and/or diagnosis of SARS-CoV-2 by FDA under an Emergency Use Authorization (EUA). This EUA will remain  in effect (meaning this test can be used) for the duration of the COVID-19 declaration under Section 56 4(b)(1) of the Act, 21 U.S.C. section 360bbb-3(b)(1), unless the authorization is terminated or revoked sooner. Performed at [facility], [place] [address], [place], [place] [address]       Radiology Studies: No results found.   Scheduled Meds: . amiodarone  200 mg Oral Daily  . atorvastatin  20 mg Oral QHS  . carvedilol  12.5 mg Oral BID  . feeding supplement (ENSURE ENLIVE)  237 mL Oral BID BM  . hydrALAZINE  25 mg Oral BID  . insulin aspart  0-15 Units Subcutaneous TID WC  . pantoprazole (PROTONIX) IV  40 mg Intravenous Q12H  . pneumococcal 23 valent vaccine  0.5 mL  Intramuscular Tomorrow-1000  . polyethylene glycol  17 g Oral Daily  . sucralfate  1 g Oral TID WC & HS   Continuous Infusions: . sodium chloride       LOS: 6 days   Time Spent in minutes   30 minutes  Shalayna Ornstein D.O. on 08/24/2019 at 9:20 AM  Between 7am to 7pm - Please see pager noted on [email or web address]  After 7pm go to [email or web address]  And look for the night coverage person covering for me after hours  [facility] Office  [phone]

## 2019-08-24 NOTE — TOC Progression Note (Signed)
Transition of Care [facility]) - Progression Note    Patient Details  Name: Krista Mason MRN: [id number] Date of Birth: [birth year]  Transition of Care [facility]) CM/SW Contact  Graves-Bigelow, Ocie Cornfield, RN Phone Number: 08/24/2019, 11:23 AM  Clinical Narrative:  Patient was faxed out over the weekend. [facility] accepted; however, patient is still deciding to see if she wants to return to the [place] [facility] vs go to facility. Patient wanted CM to look into [facility] and [facility] for bed availability. CM will continue to follow for transition of care needs.  Expected Discharge Plan: [place] Barriers to Discharge: Continued Medical Work up, SNF Pending bed offer  Expected Discharge Plan and Services Expected Discharge Plan: [place] arrangements for the past 2 months: [place]                   Social Determinants of Health (SDOH) Interventions    Readmission Risk Interventions No flowsheet data found.

## 2019-08-24 NOTE — Progress Notes (Signed)
[facility] Progress Note  Krista Mason 83 y.o. 1936/06/01  CC: GI bleed, dysphagia, esophageal stricture   Subjective: No acute issues overnight.  Denies any blood in the stool or black stool.  Denies abdominal pain.  Acid reflux improved with Maalox.  ROS : Afebrile.  Negative for acute chest pain   Objective: Vital signs in last 24 hours: Vitals:   08/24/19 0434 08/24/19 0856  BP: 129/64 (!) 161/67  Pulse: 69 72  Resp: 18   Temp: 98.3 F (36.8 C)   SpO2: 97%     Physical Exam:  General.  Elderly patient.  Not in acute distress Abdomen.  Soft, nontender, nondistended, bowel sounds present Neuro.  Alert/oriented x3 Psych.  Mood and affect normal  Lab Results: Recent Labs    08/23/19 0353 08/24/19 0650  NA 137 135  K 3.5 4.0  CL 107 103  CO2 21* 22  GLUCOSE 99 128*  BUN 16 21  CREATININE 1.86* 2.00*  CALCIUM 9.0 9.2   No results for input(s): AST, ALT, ALKPHOS, BILITOT, PROT, ALBUMIN in the last 72 hours. Recent Labs    08/23/19 1034 08/24/19 0650  HGB 10.0* 9.0*  HCT 31.0* 27.9*   No results for input(s): LABPROT, INR in the last 72 hours.    Assessment/Plan: -GI bleed.  Most likely from severe esophagitis.  Colonoscopy August 22, 2019 showed severe diverticulosis, fair prep and 7 mm cecal polyp which was removed with hot snare.  Patient had a small oozing of blood after polypectomy which was treated with 2 clips placement.  -Severe esophagitis with esophageal stricture.  Dilation up to 11 mm was performed on August 20, 2019.  Biopsy showed chronic esophagitis with associated dyskeratosis.  -Atrial fibrillation.  Eliquis on hold after polypectomy.  Hemoglobin stable.  No further bleeding  Recommendations ------------------------ -Start soft diet -Start Carafate -Continue Protonix twice daily -Continue Maalox as needed -Hold off on repeat EGD as risk for esophageal tear and rebleeding is higher given significant inflammation. -We  will reevaluate need for repeat EGD if not able to tolerate soft diet. -This was discussed with the patient.  She verbalized understanding.  GI will follow   Otis Brace MD, [place] 08/24/2019, 9:01 AM  Contact #  [phone]

## 2019-08-24 NOTE — TOC Progression Note (Signed)
Transition of Care [facility]) - Progression Note    Patient Details  Name: SAOIRSE LEGERE MRN: [id number] Date of Birth: [birth year]  Transition of Care [facility]) CM/SW Contact  Graves-Bigelow, Ocie Cornfield, RN Phone Number: 08/24/2019, 2:59 PM  Clinical Narrative: CM spoke with patient regarding transition of care needs. [facility] not in network with [facility] declined the patient- not taking any new residents at this time. The only bed acceptance was from [facility]. CM asked the patient to discuss with daughter and both patient and daughter has decided for patient to return to [facility] with [facility]. Patient has used [place] in the past and is in [place] with [employer]. Medicare.gov list provided to patient and referral was made to [facility]. SOC to begin within 24-48 hours post transition home. Per pt she will not need DME for home. Son will be coming from [place] to pick the patient up to transport home. Per patient the son will need at a day of notification for discharge. Staff RN aware. No further needs identified by CM at this time.       Expected Discharge Plan: Skilled Nursing Facility Barriers to Discharge: Continued Medical Work up(only offer for SNF bed was [facility]- pt declined- determined that she wants to return home with home health services.)  Expected Discharge Plan and Services Expected Discharge Plan: [place] In-house Referral: NA Discharge Planning Services: CM Consult Post Acute Care Choice: [place] arrangements for the past 2 months: [place]                 HH Arranged: PT, OT, Nurse's Aide, Social Work [employer] Agency: [place] Date [facility] Agency Contacted: 08/24/19 Time [place]: 1200 Representative spoke with at [place]: [place] ([place]) Interventions    Readmission Risk Interventions No flowsheet data found.

## 2019-08-24 NOTE — Progress Notes (Signed)
   Note Colonoscopy over weekend with polyp and plan to hold Eliquis x 5 days. Agree.  Note plan to possible re-EGD/dilate prior to d/c.   Continue amiodarone and coreg. Can continue to adjust hydralazine as needed for BP.   She has follow up made in AF clinic.   Electrophysiology team to see as needed while here. Please call with questions.   [address] [place]  Pager: [phone]  08/24/2019 7:18 AM

## 2019-08-24 NOTE — TOC Progression Note (Signed)
Transition of Care [facility]) - Progression Note    Patient Details  Name: Krista Mason MRN: [id number] Date of Birth: 27-Aug-1936  Transition of Care [facility]) CM/SW Contact  Graves-Bigelow, Ocie Cornfield, RN Phone Number: 08/24/2019, 11:36 AM  Clinical Narrative: [facility] is not accepting any patients at this time. Patient aware- awaiting call back from [facility] a this time.      Expected Discharge Plan: [place] Barriers to Discharge: Continued Medical Work up, SNF Pending bed offer  Expected Discharge Plan and Services Expected Discharge Plan: [place] arrangements for the past 2 months: [employer]      Social Determinants of Health (SDOH) Interventions    Readmission Risk Interventions No flowsheet data found.

## 2019-08-24 NOTE — Progress Notes (Signed)
Occupational Therapy Treatment Patient Details Name: Krista Mason MRN: [id number] DOB: 1936/02/14 Today's Date: 08/24/2019    History of present illness Pt adm with GI bleed. PMH - pacer 3 weeks ago, chf, htn, dm, afib, orthostatic hypotension   OT comments  Patient progressing well. Completing transfers and mobility using rolling walker with supervision. Discussed energy conservation techniques and recommendations for increased activity tolerance, safety specifically with bathing, dressing and IADL mgmt.  Encouraged use of rollator for energy conservation and safety within her apartment, pt agreeable.    Follow Up Recommendations  Home health OT;Supervision - Intermittent;Other (comment)(HH aide)    Equipment Recommendations  None recommended by OT    Recommendations for Other Services      Precautions / Restrictions Precautions Precautions: Fall Restrictions Weight Bearing Restrictions: No       Mobility Bed Mobility Overal bed mobility: Modified Independent             General bed mobility comments: Incr time and effort  Transfers Overall transfer level: Needs assistance Equipment used: Rolling walker (2 wheeled) Transfers: Sit to/from Stand Sit to Stand: Supervision         General transfer comment: safety    Balance Overall balance assessment: Needs assistance Sitting-balance support: No upper extremity supported;Feet supported Sitting balance-Leahy Scale: Good     Standing balance support: Bilateral upper extremity supported;During functional activity;No upper extremity supported Standing balance-Leahy Scale: Fair Standing balance comment: preference to UE support                           ADL either performed or assessed with clinical judgement   ADL Overall ADL's : Needs assistance/impaired     Grooming: Supervision/safety;Standing       Lower Body Bathing: Supervison/ safety;Sit to/from stand Lower Body Bathing Details  (indicate cue type and reason): simulated LB bathing, reviewed energy conservation techniques in depth to maximize tolerance bathing in shower      Lower Body Dressing: Supervision/safety;Sit to/from stand   Toilet Transfer: Supervision/safety;Ambulation;RW           Functional mobility during ADLs: Supervision/safety;Rolling walker General ADL Comments: reviewed energy conservation techniques throughout session related to ADL routine, patient agreeable to utilize rollator for mobility in apartment (as pt was only using in hallway before) for energy conservation, balance and safety (instead of furniture walking)     Vision       Perception     Praxis      Cognition Arousal/Alertness: Awake/alert Behavior During Therapy: WFL for tasks assessed/performed Overall Cognitive Status: Within Functional Limits for tasks assessed                                          Exercises     Shoulder Instructions       General Comments VSS    Pertinent Vitals/ Pain       Pain Assessment: No/denies pain  Home Living                                          Prior Functioning/Environment              Frequency  Min 2X/week        Progress Toward Goals  OT Goals(current goals  can now be found in the care plan section)  Progress towards OT goals: Progressing toward goals  Acute Rehab OT Goals Patient Stated Goal: get stronger OT Goal Formulation: With patient  Plan Frequency remains appropriate;Discharge plan needs to be updated    Co-evaluation                 AM-PAC OT "6 Clicks" Daily Activity     Outcome Measure   Help from another person eating meals?: None Help from another person taking care of personal grooming?: A Little Help from another person toileting, which includes using toliet, bedpan, or urinal?: A Little Help from another person bathing (including washing, rinsing, drying)?: A Little Help from another  person to put on and taking off regular upper body clothing?: A Little Help from another person to put on and taking off regular lower body clothing?: A Little 6 Click Score: 19    End of Session Equipment Utilized During Treatment: Rolling walker  OT Visit Diagnosis: Other abnormalities of gait and mobility (R26.89);Muscle weakness (generalized) (M62.81)   Activity Tolerance Patient tolerated treatment well   Patient Left in bed;with call bell/phone within reach   Nurse Communication Mobility status        Time: 8453-6468 OT Time Calculation (min): 30 min  Charges: OT General Charges $OT Visit: 1 Visit OT Treatments $Self Care/Home Management : 23-37 mins  Delight Stare, [place] Pager [phone] Office [phone]    Delight Stare 08/24/2019, 5:15 PM

## 2019-08-24 NOTE — Progress Notes (Signed)
Physical Therapy Treatment Patient Details Name: Krista Mason MRN: [id number] DOB: 08-11-1936 Today's Date: 08/24/2019    History of Present Illness Pt adm with GI bleed. PMH - pacer 3 weeks ago, chf, htn, dm, afib, orthostatic hypotension    PT Comments    Pt making good progress and activity tolerance. Feel she can now return home with HHPT.    Follow Up Recommendations  Home health PT;Supervision - Intermittent     Equipment Recommendations  None recommended by PT    Recommendations for Other Services       Precautions / Restrictions Precautions Precautions: Fall Restrictions Weight Bearing Restrictions: No    Mobility  Bed Mobility Overal bed mobility: Modified Independent             General bed mobility comments: Incr time and effort  Transfers Overall transfer level: Needs assistance Equipment used: 4-wheeled walker Transfers: Sit to/from Stand Sit to Stand: Supervision         General transfer comment: assist for safety  Ambulation/Gait Ambulation/Gait assistance: Supervision Gait Distance (Feet): 250 Feet Assistive device: 4-wheeled walker Gait Pattern/deviations: Step-through pattern;Decreased stride length Gait velocity: decr Gait velocity interpretation: <1.31 ft/sec, indicative of household ambulator General Gait Details: Steady gait with rollator. Self corrects trunk flexion. No dyspnea and no rest breaks   Stairs             Wheelchair Mobility    Modified Rankin (Stroke Patients Only)       Balance Overall balance assessment: Needs assistance Sitting-balance support: No upper extremity supported;Feet supported Sitting balance-Leahy Scale: Good     Standing balance support: No upper extremity supported Standing balance-Leahy Scale: Fair                              Cognition Arousal/Alertness: Awake/alert Behavior During Therapy: WFL for tasks assessed/performed Overall Cognitive Status: Within  Functional Limits for tasks assessed                                        Exercises      General Comments General comments (skin integrity, edema, etc.): VSS      Pertinent Vitals/Pain      Home Living                      Prior Function            PT Goals (current goals can now be found in the care plan section) Acute Rehab PT Goals Patient Stated Goal: get stronger Progress towards PT goals: Progressing toward goals    Frequency    Min 3X/week      PT Plan Discharge plan needs to be updated    Co-evaluation              AM-PAC PT "6 Clicks" Mobility   Outcome Measure  Help needed turning from your back to your side while in a flat bed without using bedrails?: None Help needed moving from lying on your back to sitting on the side of a flat bed without using bedrails?: None Help needed moving to and from a bed to a chair (including a wheelchair)?: None Help needed standing up from a chair using your arms (e.g., wheelchair or bedside chair)?: None Help needed to walk in hospital room?: A Little Help needed climbing 3-5 steps  with a railing? : A Little 6 Click Score: 22    End of Session Equipment Utilized During Treatment: Gait belt Activity Tolerance: Patient tolerated treatment well Patient left: with call bell/phone within reach;in chair   PT Visit Diagnosis: Muscle weakness (generalized) (M62.81)     Time: 6664-8616 PT Time Calculation (min) (ACUTE ONLY): 16 min  Charges:  $Gait Training: 8-22 mins                     [place] Pager [phone] Office [place] 08/24/2019, 1:22 PM

## 2019-08-25 ENCOUNTER — Inpatient Hospital Stay (HOSPITAL_COMMUNITY): Payer: Medicare HMO

## 2019-08-25 LAB — HEMOGLOBIN AND HEMATOCRIT, BLOOD
HCT: 29 % — ABNORMAL LOW (ref 36.0–46.0)
Hemoglobin: 9.1 g/dL — ABNORMAL LOW (ref 12.0–15.0)

## 2019-08-25 LAB — SURGICAL PATHOLOGY

## 2019-08-25 LAB — GLUCOSE, CAPILLARY
Glucose-Capillary: 139 mg/dL — ABNORMAL HIGH (ref 70–99)
Glucose-Capillary: 108 mg/dL — ABNORMAL HIGH (ref 70–99)

## 2019-08-25 MED ORDER — FUROSEMIDE 20 MG PO TABS: 20.0000 mg | ORAL_TABLET | Freq: Every day | ORAL | Status: DC | PRN

## 2019-08-25 MED ORDER — ENSURE ENLIVE PO LIQD: 237.0000 mL | Freq: Two times a day (BID) | ORAL | 12 refills | Status: DC

## 2019-08-25 MED ORDER — VITAMIN B-12 1000 MCG PO TABS: 1000.0000 ug | ORAL_TABLET | Freq: Every day | ORAL | 0 refills | Status: DC

## 2019-08-25 MED ORDER — SUCRALFATE 1 GM/10ML PO SUSP: 1.0000 g | Freq: Three times a day (TID) | ORAL | 0 refills | Status: DC

## 2019-08-25 MED ORDER — APIXABAN 2.5 MG PO TABS: 2.5000 mg | ORAL_TABLET | Freq: Two times a day (BID) | ORAL | Status: DC

## 2019-08-25 MED ORDER — HYDRALAZINE HCL 25 MG PO TABS: 25.0000 mg | ORAL_TABLET | Freq: Two times a day (BID) | ORAL | 0 refills | Status: DC

## 2019-08-25 MED ORDER — PANTOPRAZOLE SODIUM 40 MG PO TBEC: 40.0000 mg | DELAYED_RELEASE_TABLET | Freq: Two times a day (BID) | ORAL | 1 refills | Status: DC

## 2019-08-25 NOTE — Discharge Summary (Addendum)
Physician Discharge Summary  Krista Mason MVE:[id number] DOB: 07/29/1936 DOA: 08/18/2019  PCP: Leighton Ruff, MD  Admit date: 08/18/2019 Discharge date: 08/25/2019  Time spent: 45 minutes  Recommendations for Outpatient Follow-up:  Patient will be discharged to home with home health services.  Patient will need to follow up with primary care provider within one week of discharge.  Follow up with gastroenterology and cardiology. Patient should continue medications as prescribed.  Patient should follow a soft diet. Resume Eliquis on 08/28/2019.  Discharge Diagnoses:  UGI Bleed/ Acute normocytic anemia/ Esophageal stenosis Chronic systolic and diastolic heart failure Paroxysmal atrial fibrillation Diabetes mellitus, type II Chronic kidney disease, stage IV Orthostatic hypotension Essential hypertension Goals of care/CODE STATUS Fatigue  Discharge Condition: Stable  Diet recommendation: soft  Filed Weights   08/22/19 0729 08/23/19 0523 08/24/19 0434  Weight: 67.9 kg 67.2 kg 68 kg    History of present illness:  On on 08/18/2019 by Dr. Franchot Mimes Longis a 83 y.o.femalewith medical history significant ofCHF, s/p PPM placement 3 weeks ago, A. Fib started on Eliquis 1 weeks ago for stroke prevention 2/2 CHA2DS2-VASc score of 6, started 1 week ago on Amiodarone. Last echo 3/12 with EF 35%. She is followed in the a.fib and CHF clinics. She has chronic esophageal stricture and is s/p dilatation x 5 last 10/29/2018. She has no h/o PUD but does take PPI bid. She has chronic SOB for months unimproved after PPM. Over the last 24-48 hours she has felt weak, light-headed with continued, but not worsening, SOB. She has had several episodes of dark black tarry stools. Due to her symptoms she presents to [facility].  Hospital Course:  GI Bleed/ Acute normocytic anemia/ Esophageal stenosis  -Patient states she has had dark stools for some time.  She was started on Eliquis 1 week ago.  -Of note, patient has had a history of esophageal stricture and required dilatation.  Patient did have EGD noted in December 2019 as well as January 2020 -FOBT positive -Hemoglobin currently 9.1. Was over 12 in August 2020 -was on a protonix drip, however now transitioned to protonix BID -Gastroenterology consulted and appreciated, s/p EGD: LA Grade D esophagitis, biopsied.  Benign-appearing esophageal stenosis, dilated.  -Biopsy showed chronic esophagitis with associated dyskeratosis. -s/p colonoscopy: Cecal polyp which was removed, some bleeding- 2 clips were placed. Extensive severe diverticulosis. -Anemia panel showed adequate iron and storage.  -Gastroenterology recommended holding Eliquis for at least 5 days.  Now recommending we evaluate need for EGD if patient is unable to tolerate soft diet.  Would like to hold off on EGD given the risk of esophageal tearing bleeding is higher given her significant inflammation. -Advanced to soft diet and tolerated it well  Chronic systolic and diastolic heart failure -Currently appears to be euvolemic and stable -Last echocardiogram 01/01/2019 showed an EF of 35%, LV Doppler parameters consistent with impaired relaxation -Continue Coreg. Lasix held as patient euvolemic and has orthostatic hypotension -discussed weight gain, restart lasix if weight gain and follow up with cardiology  Paroxysmal atrial fibrillation -CHADSVASC 6 -Recently placed on Eliquis approximately 1 week ago, which is now been held in the setting of GI bleed -Continue amiodarone, coreg  Diabetes mellitus, type II -Appears to be diet controlled as patient is not on any home medications -Last hemoglobin A1c 6.9 on 06/11/2019  Chronic kidney disease, stage IV -Creatinine currently 2, stable  Orthostatic hypotension -BP does drop with change in position -lasix held -Decreased hydralazine dose to 25mg  BID -  Continue compression stockings, may try abdominal binder  -Continues to be orthostatic  Essential hypertension -Continue Coreg, hydralazine  Goals of care/CODE STATUS -Had [name] discussion with patient regarding her CODE STATUS.  Has opted to become DO NOT RESUSCITATE.  Fatigue -Possibly multifactorial including anemia versus deconditioning versus her history of CHF and A. fib -Vitamin D 50.31, TSH 1.720 (WNL) -PT and OT rec SNF- however patient thinking about HH. CM and SW consulted. -Though Vitamin B12 is WNL, 215, continue to supplement  Left index finger pain -Been ongoing for approximately 3 weeks -appears patient has arthritis/osteoarthritis -mild edema and warmth -left hand xray: Soft tissue swelling of the second digit diffusely of uncertain etiology.  Osteoarthritis in multiple distal joints as well as the scaphotrapezial joint.  No erosive change by instruction. -Symptomatic treatment and follow-up with PCP  Procedures: EGD Colonoscopy  Consultations: Gastroenterology Cardiology  Discharge Exam: Vitals:   08/24/19 2115 08/25/19 0859  BP: (!) 157/68 (!) 143/70  Pulse: 69 70  Resp:    Temp: 98 F (36.7 C)   SpO2: 97%      General: Well developed, chronically ill appearing, NAD  HEENT: NCAT, mild periorbital edema with noted bruising on upper lid, mucous membranes moist.  Cardiovascular: S1 S2 auscultated, RRR, No murmur  Respiratory: Clear to auscultation bilaterally with equal chest rise  Abdomen: Soft, nontender, nondistended, + bowel sounds  Extremities: warm dry without cyanosis clubbing or edema. Left index finger with arthritis and mild edema  Neuro: AAOx3, nonfocal  Psych: Appropriate mood and affect  Discharge Instructions Discharge Instructions    Discharge instructions   Complete by: As directed    Patient will be discharged to home with home health services.  Patient will need to follow up with primary care provider within one week of discharge.  Follow up with gastroenterology and  cardiology. Patient should continue medications as prescribed.  Patient should follow a soft diet. Resume Eliquis on 08/28/2019.     Allergies as of 08/25/2019      Reactions   Epinephrine Other (See Comments)   "feels like heart is beating out of chest"   Penicillins Itching, Rash   Amoxicillin ok- IM pen is what gives reaction Did it involve swelling of the face/tongue/throat, SOB, or low BP? No Did it involve sudden or severe rash/hives, skin peeling, or any reaction on the inside of your mouth or nose? No Did you need to seek medical attention at a hospital or doctor's office? No When did it last happen?10 + year If all above answers are "NO", may proceed with cephalosporin use.   Ceclor [cefaclor] Itching   Crestor [rosuvastatin Calcium] Nausea Only   Influenza Vaccines    Swelling and redness at injection site, fever   Nsaids    Pt states she is not taking because of kidney function   Percocet [oxycodone-acetaminophen] Nausea And Vomiting   Prednisone    Dizziness, spiked blood sugar      Medication List    TAKE these medications   amiodarone 200 MG tablet Commonly known as: Pacerone Take 1 tablet (200 mg total) by mouth daily.   apixaban 2.5 MG Tabs tablet Commonly known as: Eliquis Take 1 tablet (2.5 mg total) by mouth 2 (two) times daily. Start taking on: August 28, 2019 What changed: These instructions start on August 28, 2019. If you are unsure what to do until then, ask your doctor or other care provider.   atorvastatin 20 MG tablet Commonly known as: LIPITOR TAKE  1 TABLET BY MOUTH EVERY DAY What changed: when to take this   carvedilol 12.5 MG tablet Commonly known as: COREG Take 1 tablet (12.5 mg total) by mouth 2 (two) times daily.   feeding supplement (ENSURE ENLIVE) Liqd Take 237 mLs by mouth 2 (two) times daily between meals.   furosemide 20 MG tablet Commonly known as: LASIX Take 1 tablet (20 mg total) by mouth daily as needed for fluid or  edema (weight gain). What changed:   when to take this  reasons to take this   hydrALAZINE 25 MG tablet Commonly known as: APRESOLINE Take 1 tablet (25 mg total) by mouth 2 (two) times daily. What changed:   medication strength  how much to take   omeprazole 40 MG capsule Commonly known as: PRILOSEC Take 40 mg by mouth 2 (two) times daily.   sucralfate 1 GM/10ML suspension Commonly known as: CARAFATE Take 10 mLs (1 g total) by mouth 4 (four) times daily -  with meals and at bedtime.   SYSTANE OP Place 1 drop into both eyes 2 (two) times daily as needed (dryness).   VICKS VAPORUB EX Apply 1 application topically daily as needed (toe fungus).   vitamin B-12 1000 MCG tablet Commonly known as: CYANOCOBALAMIN Take 1 tablet (1,000 mcg total) by mouth daily.   Vitamin D 50 MCG (2000 UT) tablet Take 2,000 Units by mouth daily.      Allergies  Allergen Reactions  . Epinephrine Other (See Comments)    "feels like heart is beating out of chest"  . Penicillins Itching and Rash    Amoxicillin ok- IM pen is what gives reaction  Did it involve swelling of the face/tongue/throat, SOB, or low BP? No Did it involve sudden or severe rash/hives, skin peeling, or any reaction on the inside of your mouth or nose? No Did you need to seek medical attention at a hospital or doctor's office? No When did it last happen?10 + year If all above answers are "NO", may proceed with cephalosporin use.   [name] [Cefaclor] Itching  . Crestor [Rosuvastatin Calcium] Nausea Only  . Influenza Vaccines     Swelling and redness at injection site, fever  . Nsaids     Pt states she is not taking because of kidney function  . Percocet [Oxycodone-Acetaminophen] Nausea And Vomiting  . Prednisone     Dizziness, spiked blood sugar   Follow-up Information    [facility], [facility] Follow up.   Specialty: Home Health Services Why: Physical, Occupational Therapy, Aide, Social Worker- agency to  contact the patient for visit times.  Contact information: [place] [address] [phone]        Leighton Ruff, MD. Schedule an appointment as soon as possible for a visit in 1 week(s).   Specialty: Family Medicine Why: Hospital follow up Contact information: [place] [address] [phone]        Constance Haw, MD .   Specialty: Cardiology Contact information: [place] [address] [phone]        Bensimhon, Shaune Pascal, MD .   Specialty: Cardiology Contact information: [address] [place] [address] [phone]        Ronald Lobo, MD. Schedule an appointment as soon as possible for a visit in 2 week(s).   Specialty: Gastroenterology Why: Hospital follow up Contact information: [address] [place] [address]  [phone]            The results of significant diagnostics from this hospitalization (including imaging, microbiology, ancillary and laboratory) are listed below for reference.    Significant Diagnostic Studies: Dg Chest Portable 1 View  Result Date: 08/18/2019 CLINICAL DATA:  Shortness of breath EXAM: PORTABLE CHEST 1 VIEW COMPARISON:  July 25, 2019 FINDINGS: No edema or consolidation. There is cardiomegaly with pulmonary vascularity normal. There is aortic atherosclerosis. Pacemaker leads are attached to the right atrium, right ventricle, and coronary sinus. There is a calcified aortopulmonary window lymph node, stable. No pneumothorax. No bone lesions. IMPRESSION: No edema or consolidation. Stable cardiomegaly. Stable pacemaker lead placement. Stable calcified lymph node in the aortopulmonary window region consistent with prior granulomatous disease. Aortic Atherosclerosis (ICD10-I70.0). Electronically Signed   By: Lowella Grip III M.D.   On: 08/18/2019 16:11   Dg Hand Complete Left  Result Date:  08/25/2019 CLINICAL DATA:  Soft tissue swelling, left second digit. Diabetes mellitus EXAM: LEFT HAND - COMPLETE 3+ VIEW COMPARISON:  None. FINDINGS: Frontal, oblique, and lateral views were obtained. There is diffuse swelling the second digit. There is no evident fracture or dislocation. There is osteoarthritic change in all PIP and DIP joints. There is also osteoarthritic change in the scaphotrapezial joint. A small amount calcification lateral to the trapezium is likely of arthropathic etiology. There is no erosive change or bony destruction. There is a degree of underlying osteoporosis. IMPRESSION: 1. Soft tissue swelling of the second digit diffusely of uncertain etiology. 2. Osteoarthritis in multiple distal joints as well as in the scaphotrapezial joint. No erosive change or bony destruction. 3.  Underlying osteoporosis. Electronically Signed   By: Lowella Grip III M.D.   On: 08/25/2019 07:52    Microbiology: Recent Results (from the past 240 hour(s))  SARS CORONAVIRUS 2 (TAT 6-24 HRS) Nasopharyngeal Nasopharyngeal Swab     Status: None   Collection Time: 08/18/19  4:33 PM   Specimen: Nasopharyngeal Swab  Result Value Ref Range Status   SARS Coronavirus 2 NEGATIVE NEGATIVE Final    Comment: (NOTE) SARS-CoV-2 target nucleic acids are NOT DETECTED. The SARS-CoV-2 RNA is generally detectable in upper and lower respiratory specimens during the acute phase of infection. Negative results do not preclude SARS-CoV-2 infection, do not rule out co-infections with other pathogens, and should not be used as the sole basis for treatment or other patient management decisions. Negative results must be combined with clinical observations, patient history, and epidemiological information. The expected result is Negative. Fact Sheet for Patients: SugarRoll.be Fact Sheet for Healthcare Providers: https://www.woods-mathews.com/ This test is not yet approved or  cleared by the [place] FDA and  has been authorized for detection and/or diagnosis of SARS-CoV-2 by FDA under an Emergency Use Authorization (EUA). This EUA will remain  in effect (meaning this test can be used) for the duration of the COVID-19 declaration under Section 56 4(b)(1) of the Act, 21 U.S.C. section 360bbb-3(b)(1), unless the authorization is terminated or revoked sooner. Performed at [facility], [place] [address], [place], [place] [address]      Labs: Basic Metabolic Panel: Recent Labs  Lab 08/20/19 0417 08/21/19 0339 08/22/19 0451 08/23/19 0353 08/24/19 0650  NA 137 137 137 137 135  K 3.8 3.5 3.9 3.5 4.0  CL 105 106 105 107 103  CO2 24 20* 20* 21* 22  GLUCOSE 118* 93 108* 99 128*  BUN 26* 26* 24* 16 21  CREATININE 2.11* 2.10* 2.00* 1.86*  2.00*  CALCIUM 9.0 9.1 9.2 9.0 9.2   Liver Function Tests: Recent Labs  Lab 08/18/19 1511  AST 18  ALT 17  ALKPHOS 81  BILITOT 0.9  PROT 6.6  ALBUMIN 3.5   No results for input(s): LIPASE, AMYLASE in the last 168 hours. No results for input(s): AMMONIA in the last 168 hours. CBC: Recent Labs  Lab 08/18/19 1511  08/20/19 0417 08/21/19 0339 08/22/19 0451 08/23/19 1034 08/24/19 0650 08/25/19 0647  WBC 7.3  --  6.1 6.8  --   --   --   --   NEUTROABS 5.6  --   --   --   --   --   --   --   HGB 10.5*   < > 9.1* 8.6* 8.6* 10.0* 9.0* 9.1*  HCT 31.4*   < > 27.8* 27.0* 26.7* 31.0* 27.9* 29.0*  MCV 89.2  --  90.0 91.8  --   --   --   --   PLT 229  --  199 194  --   --   --   --    < > = values in this interval not displayed.   Cardiac Enzymes: No results for input(s): CKTOTAL, CKMB, CKMBINDEX, TROPONINI in the last 168 hours. BNP: BNP (last 3 results) Recent Labs    06/11/19 1206 08/06/19 1548 08/18/19 1511  BNP 131.8* 432.7* 348.9*    ProBNP (last 3 results) No results for input(s): PROBNP in the last 8760 hours.  CBG: Recent Labs  Lab 08/24/19 0735 08/24/19 1136 08/24/19 1708 08/24/19  2118 08/25/19 0833  GLUCAP 138* 115* 153* 138* 139*       Signed:  Georgian Mcclory  [facility] 08/25/2019, 9:54 AM

## 2019-08-25 NOTE — Discharge Instructions (Signed)
Orthostatic Hypotension °Blood pressure is a measurement of how strongly, or weakly, your blood is pressing against the walls of your arteries. Orthostatic hypotension is a sudden drop in blood pressure that happens when you quickly change positions, such as when you get up from sitting or lying down. °Arteries are blood vessels that carry blood from your heart throughout your body. When blood pressure is too low, you may not get enough blood to your brain or to the rest of your organs. This can cause weakness, light-headedness, rapid heartbeat, and fainting. This can last for just a few seconds or for up to a few minutes. Orthostatic hypotension is usually not a serious problem. However, if it happens frequently or gets worse, it may be a sign of something more serious. °What are the causes? °This condition may be caused by: °· Sudden changes in posture, such as standing up quickly after you have been sitting or lying down. °· Blood loss. °· Loss of body fluids (dehydration). °· Heart problems. °· Hormone (endocrine) problems. °· Pregnancy. °· Severe infection. °· Lack of certain nutrients. °· Severe allergic reactions (anaphylaxis). °· Certain medicines, such as blood pressure medicine or medicines that make the body lose excess fluids (diuretics). Sometimes, this condition can be caused by not taking medicine as directed, such as taking too much of a certain medicine. °What increases the risk? °The following factors may make you more likely to develop this condition: °· Age. Risk increases as you get older. °· Conditions that affect the heart or the central nervous system. °· Taking certain medicines, such as blood pressure medicine or diuretics. °· Being pregnant. °What are the signs or symptoms? °Symptoms of this condition may include: °· Weakness. °· Light-headedness. °· Dizziness. °· Blurred vision. °· Fatigue. °· Rapid heartbeat. °· Fainting, in severe cases. °How is this diagnosed? °This condition is  diagnosed based on: °· Your medical history. °· Your symptoms. °· Your blood pressure measurement. Your health care provider will check your blood pressure when you are: °? Lying down. °? Sitting. °? Standing. °A blood pressure reading is recorded as two numbers, such as "120 over 80" (or 120/80). The first ("top") number is called the systolic pressure. It is a measure of the pressure in your arteries as your heart beats. The second ("bottom") number is called the diastolic pressure. It is a measure of the pressure in your arteries when your heart relaxes between beats. Blood pressure is measured in a unit called mm Hg. Healthy blood pressure for most adults is 120/80. If your blood pressure is below 90/60, you may be diagnosed with hypotension. °Other information or tests that may be used to diagnose orthostatic hypotension include: °· Your other vital signs, such as your heart rate and temperature. °· Blood tests. °· Tilt table test. For this test, you will be safely secured to a table that moves you from a lying position to an upright position. Your heart rhythm and blood pressure will be monitored during the test. °How is this treated? °This condition may be treated by: °· Changing your diet. This may involve eating more salt (sodium) or drinking more water. °· Taking medicines to raise your blood pressure. °· Changing the dosage of certain medicines you are taking that might be lowering your blood pressure. °· Wearing compression stockings. These stockings help to prevent blood clots and reduce swelling in your legs. °In some cases, you may need to go to the hospital for: °· Fluid replacement. This means you will   receive fluids through an IV.  Blood replacement. This means you will receive donated blood through an IV (transfusion).  Treating an infection or heart problems, if this applies.  Monitoring. You may need to be monitored while medicines that you are taking wear off. Follow these instructions  at home: Eating and drinking   Drink enough fluid to keep your urine pale yellow.  Eat a healthy diet, and follow instructions from your health care provider about eating or drinking restrictions. A healthy diet includes: ? Fresh fruits and vegetables. ? Whole grains. ? Lean meats. ? Low-fat dairy products.  Eat extra salt only as directed. Do not add extra salt to your diet unless your health care provider told you to do that.  Eat frequent, small meals.  Avoid standing up suddenly after eating. Medicines  Take over-the-counter and prescription medicines only as told by your health care provider. ? Follow instructions from your health care provider about changing the dosage of your current medicines, if this applies. ? Do not stop or adjust any of your medicines on your own. General instructions   Wear compression stockings as told by your health care provider.  Get up slowly from lying down or sitting positions. This gives your blood pressure a chance to adjust.  Avoid hot showers and excessive heat as directed by your health care provider.  Return to your normal activities as told by your health care provider. Ask your health care provider what activities are safe for you.  Do not use any products that contain nicotine or tobacco, such as cigarettes, e-cigarettes, and chewing tobacco. If you need help quitting, ask your health care provider.  Keep all follow-up visits as told by your health care provider. This is important. Contact a health care provider if you:  Vomit.  Have diarrhea.  Have a fever for more than 2-3 days.  Feel more thirsty than usual.  Feel weak and tired. Get help right away if you:  Have chest pain.  Have a fast or irregular heartbeat.  Develop numbness in any part of your body.  Cannot move your arms or your legs.  Have trouble speaking.  Become sweaty or feel light-headed.  Faint.  Feel short of breath.  Have trouble staying  awake.  Feel confused. Summary  Orthostatic hypotension is a sudden drop in blood pressure that happens when you quickly change positions.  Orthostatic hypotension is usually not a serious problem.  It is diagnosed by having your blood pressure taken lying down, sitting, and then standing.  It may be treated by changing your diet or adjusting your medicines. This information is not intended to replace advice given to you by your health care provider. Make sure you discuss any questions you have with your health care provider. Document Released: 09/28/2002 Document Revised: 04/03/2018 Document Reviewed: 04/03/2018 Elsevier Patient Education  [place]. Fatigue If you have fatigue, you feel tired all the time and have a lack of energy or a lack of motivation. Fatigue may make it difficult to start or complete tasks because of exhaustion. In general, occasional or mild fatigue is often a normal response to activity or life. However, [place]-lasting (chronic) or extreme fatigue may be a symptom of a medical condition. Follow these instructions at home: General instructions  Watch your fatigue for any changes.  Go to bed and get up at the same time every day.  Avoid fatigue by pacing yourself during the day and getting enough sleep at night.  Maintain a healthy weight. Medicines  Take over-the-counter and prescription medicines only as told by your health care provider.  Take a multivitamin, if told by your health care provider.  Do not use herbal or dietary supplements unless they are approved by your health care provider. Activity   Exercise regularly, as told by your health care provider.  Use or practice techniques to help you relax, such as yoga, tai chi, meditation, or massage therapy. Eating and drinking   Avoid heavy meals in the evening.  Eat a well-balanced diet, which includes lean proteins, whole grains, plenty of fruits and vegetables, and low-fat dairy  products.  Avoid consuming too much caffeine.  Avoid the use of alcohol.  Drink enough fluid to keep your urine pale yellow. Lifestyle  Change situations that cause you stress. Try to keep your work and personal schedule in balance.  Do not use any products that contain nicotine or tobacco, such as cigarettes and e-cigarettes. If you need help quitting, ask your health care provider.  Do not use drugs. Contact a health care provider if:  Your fatigue does not get better.  You have a fever.  You suddenly lose or gain weight.  You have headaches.  You have trouble falling asleep or sleeping through the night.  You feel angry, guilty, anxious, or sad.  You are unable to have a bowel movement (constipation).  Your skin is dry.  You have swelling in your legs or another part of your body. Get help right away if:  You feel confused.  Your vision is blurry.  You feel faint or you pass out.  You have a severe headache.  You have severe pain in your abdomen, your back, or the area between your waist and hips (pelvis).  You have chest pain, shortness of breath, or an irregular or fast heartbeat.  You are unable to urinate, or you urinate less than normal.  You have abnormal bleeding, such as bleeding from the rectum, vagina, nose, lungs, or nipples.  You vomit blood.  You have thoughts about hurting yourself or others. If you ever feel like you may hurt yourself or others, or have thoughts about taking your own life, get help right away. You can go to your nearest emergency department or call:  Your local emergency services (911 in the [place]  A suicide crisis helpline, such as the [place] at 769-423-4388. This is open 24 hours a day. Summary  If you have fatigue, you feel tired all the time and have a lack of energy or a lack of motivation.  Fatigue may make it difficult to start or complete tasks because of  exhaustion.  [place]-lasting (chronic) or extreme fatigue may be a symptom of a medical condition.  Exercise regularly, as told by your health care provider.  Change situations that cause you stress. Try to keep your work and personal schedule in balance. This information is not intended to replace advice given to you by your health care provider. Make sure you discuss any questions you have with your health care provider. Document Released: 08/05/2007 Document Revised: 01/29/2019 Document Reviewed: 07/03/2017 Elsevier Patient Education  [place]. Anemia  Anemia is a condition in which you do not have enough red blood cells or hemoglobin. Hemoglobin is a substance in red blood cells that carries oxygen. When you do not have enough red blood cells or hemoglobin (are anemic), your body cannot get enough oxygen and your organs may not work properly.  As a result, you may feel very tired or have other problems. What are the causes? Common causes of anemia include:  Excessive bleeding. Anemia can be caused by excessive bleeding inside or outside the body, including bleeding from the intestine or from periods in women.  Poor nutrition.  [place]-lasting (chronic) kidney, thyroid, and liver disease.  Bone marrow disorders.  Cancer and treatments for cancer.  HIV (human immunodeficiency virus) and AIDS (acquired immunodeficiency syndrome).  Treatments for HIV and AIDS.  Spleen problems.  Blood disorders.  Infections, medicines, and autoimmune disorders that destroy red blood cells. What are the signs or symptoms? Symptoms of this condition include:  Minor weakness.  Dizziness.  Headache.  Feeling heartbeats that are irregular or faster than normal (palpitations).  Shortness of breath, especially with exercise.  Paleness.  Cold sensitivity.  Indigestion.  Nausea.  Difficulty sleeping.  Difficulty concentrating. Symptoms may occur suddenly or develop slowly. If your  anemia is mild, you may not have symptoms. How is this diagnosed? This condition is diagnosed based on:  Blood tests.  Your medical history.  A physical exam.  Bone marrow biopsy. Your health care provider may also check your stool (feces) for blood and may do additional testing to look for the cause of your bleeding. You may also have other tests, including:  Imaging tests, such as a CT scan or MRI.  Endoscopy.  Colonoscopy. How is this treated? Treatment for this condition depends on the cause. If you continue to lose a lot of blood, you may need to be treated at a hospital. Treatment may include:  Taking supplements of iron, vitamin H20, or folic acid.  Taking a hormone medicine (erythropoietin) that can help to stimulate red blood cell growth.  Having a blood transfusion. This may be needed if you lose a lot of blood.  Making changes to your diet.  Having surgery to remove your spleen. Follow these instructions at home:  Take over-the-counter and prescription medicines only as told by your health care provider.  Take supplements only as told by your health care provider.  Follow any diet instructions that you were given.  Keep all follow-up visits as told by your health care provider. This is important. Contact a health care provider if:  You develop new bleeding anywhere in the body. Get help right away if:  You are very weak.  You are short of breath.  You have pain in your abdomen or chest.  You are dizzy or feel faint.  You have trouble concentrating.  You have bloody or black, tarry stools.  You vomit repeatedly or you vomit up blood. Summary  Anemia is a condition in which you do not have enough red blood cells or enough of a substance in your red blood cells that carries oxygen (hemoglobin).  Symptoms may occur suddenly or develop slowly.  If your anemia is mild, you may not have symptoms.  This condition is diagnosed with blood tests as  well as a medical history and physical exam. Other tests may be needed.  Treatment for this condition depends on the cause of the anemia. This information is not intended to replace advice given to you by your health care provider. Make sure you discuss any questions you have with your health care provider. Document Released: 11/15/2004 Document Revised: 09/20/2017 Document Reviewed: 11/09/2016 Elsevier Patient Education  2020 [employer].

## 2019-08-25 NOTE — Progress Notes (Signed)
Physical Therapy Treatment Patient Details Name: Krista Mason MRN: [id number] DOB: 23-Dec-1935 Today's Date: 08/25/2019    History of Present Illness Pt adm with GI bleed. PMH - pacer 3 weeks ago, chf, htn, dm, afib, orthostatic hypotension    PT Comments    Pt continues to progress with mobility. Ready for transition home with [facility] services.    Follow Up Recommendations  Home health PT;Supervision - Intermittent     Equipment Recommendations  None recommended by PT    Recommendations for Other Services       Precautions / Restrictions Precautions Precautions: Fall Restrictions Weight Bearing Restrictions: No    Mobility  Bed Mobility Overal bed mobility: Modified Independent             General bed mobility comments: Incr time and effort  Transfers Overall transfer level: Modified independent Equipment used: 4-wheeled walker Transfers: Sit to/from Stand Sit to Stand: Modified independent (Device/Increase time)            Ambulation/Gait Ambulation/Gait assistance: Supervision Gait Distance (Feet): 225 Feet Assistive device: 4-wheeled walker Gait Pattern/deviations: Step-through pattern;Decreased stride length Gait velocity: decr Gait velocity interpretation: 1.31 - 2.62 ft/sec, indicative of limited community ambulator General Gait Details: Steady gait with rollator. 1 standing rest break.   Stairs             Wheelchair Mobility    Modified Rankin (Stroke Patients Only)       Balance Overall balance assessment: Needs assistance Sitting-balance support: No upper extremity supported;Feet supported Sitting balance-Leahy Scale: Good     Standing balance support: No upper extremity supported Standing balance-Leahy Scale: Fair                              Cognition Arousal/Alertness: Awake/alert Behavior During Therapy: WFL for tasks assessed/performed Overall Cognitive Status: Within Functional Limits for tasks assessed                                         Exercises      General Comments        Pertinent Vitals/Pain      Home Living                      Prior Function            PT Goals (current goals can now be found in the care plan section) Acute Rehab PT Goals Patient Stated Goal: get stronger Progress towards PT goals: Progressing toward goals    Frequency    Min 3X/week      PT Plan Current plan remains appropriate    Co-evaluation              AM-PAC PT "6 Clicks" Mobility   Outcome Measure  Help needed turning from your back to your side while in a flat bed without using bedrails?: None Help needed moving from lying on your back to sitting on the side of a flat bed without using bedrails?: None Help needed moving to and from a bed to a chair (including a wheelchair)?: None Help needed standing up from a chair using your arms (e.g., wheelchair or bedside chair)?: None Help needed to walk in hospital room?: A Little Help needed climbing 3-5 steps with a railing? : A Little 6 Click Score: 22  End of Session   Activity Tolerance: Patient tolerated treatment well Patient left: with call bell/phone within reach;in bed   PT Visit Diagnosis: Muscle weakness (generalized) (M62.81)     Time: 3009-2330 PT Time Calculation (min) (ACUTE ONLY): 12 min  Charges:  $Gait Training: 8-22 mins                     [place] Pager [phone] Office [place] 08/25/2019, 11:34 AM

## 2019-08-25 NOTE — Progress Notes (Signed)
[facility] Progress Note  Krista Mason 83 y.o. 1935/12/22  CC: GI bleed, dysphagia, esophageal stricture   Subjective: No acute issues overnight.  Tolerating soft diet.  Ready to go home.  Seen and examined at bedside prior to her discharge.  ROS : Afebrile.  Negative for acute chest pain   Objective: Vital signs in last 24 hours: Vitals:   08/24/19 2115 08/25/19 0859  BP: (!) 157/68 (!) 143/70  Pulse: 69 70  Resp:    Temp: 98 F (36.7 C) 97.8 F (36.6 C)  SpO2: 97% 97%    Physical Exam:  General.  Elderly patient.  Not in acute distress Abdomen.  Soft, nontender, nondistended, bowel sounds present Neuro.  Alert/oriented x3 Psych.  Mood and affect normal  Lab Results: Recent Labs    08/23/19 0353 08/24/19 0650  NA 137 135  K 3.5 4.0  CL 107 103  CO2 21* 22  GLUCOSE 99 128*  BUN 16 21  CREATININE 1.86* 2.00*  CALCIUM 9.0 9.2   No results for input(s): AST, ALT, ALKPHOS, BILITOT, PROT, ALBUMIN in the last 72 hours. Recent Labs    08/24/19 0650 08/25/19 0647  HGB 9.0* 9.1*  HCT 27.9* 29.0*   No results for input(s): LABPROT, INR in the last 72 hours.    Assessment/Plan: -GI bleed.  Most likely from severe esophagitis.  Colonoscopy August 22, 2019 showed severe diverticulosis, fair prep and 7 mm cecal polyp which was removed with hot snare.  Patient had a small oozing of blood after polypectomy which was treated with 2 clips placement.  -Severe esophagitis with esophageal stricture.  Dilation up to 11 mm was performed on August 20, 2019.  Biopsy showed chronic esophagitis with associated dyskeratosis.  -Atrial fibrillation.  Eliquis on hold after polypectomy.  Hemoglobin stable.  No further bleeding  Recommendations ------------------------ -Discharge recommendations were discussed with Dr. Ree Kida. -She will need to stay on soft diet.  Chopped food if needed and chew it well. -Continue Protonix 40 mg twice a day for another 4 weeks.   Continue Carafate. -Continue Maalox as needed -Okay to resume anticoagulation in 2 days. -Follow-up with Dr. Cristina Gong as an outpatient to decide need for repeat endoscopy if ongoing swallowing issues.    Otis Brace MD, [place] 08/25/2019, 11:49 AM  Contact #  [phone]

## 2019-08-26 DIAGNOSIS — K2091 Esophagitis, unspecified with bleeding: Secondary | ICD-10-CM | POA: Diagnosis not present

## 2019-08-26 DIAGNOSIS — L858 Other specified epidermal thickening: Secondary | ICD-10-CM | POA: Diagnosis not present

## 2019-08-26 DIAGNOSIS — K222 Esophageal obstruction: Secondary | ICD-10-CM | POA: Diagnosis not present

## 2019-08-26 DIAGNOSIS — D631 Anemia in chronic kidney disease: Secondary | ICD-10-CM | POA: Diagnosis not present

## 2019-08-26 DIAGNOSIS — E1122 Type 2 diabetes mellitus with diabetic chronic kidney disease: Secondary | ICD-10-CM | POA: Diagnosis not present

## 2019-08-26 DIAGNOSIS — I5042 Chronic combined systolic (congestive) and diastolic (congestive) heart failure: Secondary | ICD-10-CM | POA: Diagnosis not present

## 2019-08-26 DIAGNOSIS — I480 Paroxysmal atrial fibrillation: Secondary | ICD-10-CM | POA: Diagnosis not present

## 2019-08-26 DIAGNOSIS — I130 Hypertensive heart and chronic kidney disease with heart failure and stage 1 through stage 4 chronic kidney disease, or unspecified chronic kidney disease: Secondary | ICD-10-CM | POA: Diagnosis not present

## 2019-08-26 DIAGNOSIS — N184 Chronic kidney disease, stage 4 (severe): Secondary | ICD-10-CM | POA: Diagnosis not present

## 2019-08-26 DIAGNOSIS — K5731 Diverticulosis of large intestine without perforation or abscess with bleeding: Secondary | ICD-10-CM | POA: Diagnosis not present

## 2019-08-27 DIAGNOSIS — K5731 Diverticulosis of large intestine without perforation or abscess with bleeding: Secondary | ICD-10-CM | POA: Diagnosis not present

## 2019-08-27 DIAGNOSIS — L858 Other specified epidermal thickening: Secondary | ICD-10-CM | POA: Diagnosis not present

## 2019-08-27 DIAGNOSIS — K222 Esophageal obstruction: Secondary | ICD-10-CM | POA: Diagnosis not present

## 2019-08-27 DIAGNOSIS — I5042 Chronic combined systolic (congestive) and diastolic (congestive) heart failure: Secondary | ICD-10-CM | POA: Diagnosis not present

## 2019-08-27 DIAGNOSIS — D631 Anemia in chronic kidney disease: Secondary | ICD-10-CM | POA: Diagnosis not present

## 2019-08-27 DIAGNOSIS — I130 Hypertensive heart and chronic kidney disease with heart failure and stage 1 through stage 4 chronic kidney disease, or unspecified chronic kidney disease: Secondary | ICD-10-CM | POA: Diagnosis not present

## 2019-08-27 DIAGNOSIS — E1122 Type 2 diabetes mellitus with diabetic chronic kidney disease: Secondary | ICD-10-CM | POA: Diagnosis not present

## 2019-08-27 DIAGNOSIS — I480 Paroxysmal atrial fibrillation: Secondary | ICD-10-CM | POA: Diagnosis not present

## 2019-08-27 DIAGNOSIS — N184 Chronic kidney disease, stage 4 (severe): Secondary | ICD-10-CM | POA: Diagnosis not present

## 2019-08-27 DIAGNOSIS — K2091 Esophagitis, unspecified with bleeding: Secondary | ICD-10-CM | POA: Diagnosis not present

## 2019-09-03 DIAGNOSIS — D500 Iron deficiency anemia secondary to blood loss (chronic): Secondary | ICD-10-CM | POA: Diagnosis not present

## 2019-09-03 DIAGNOSIS — I5042 Chronic combined systolic (congestive) and diastolic (congestive) heart failure: Secondary | ICD-10-CM | POA: Diagnosis not present

## 2019-09-03 DIAGNOSIS — E1122 Type 2 diabetes mellitus with diabetic chronic kidney disease: Secondary | ICD-10-CM | POA: Diagnosis not present

## 2019-09-03 DIAGNOSIS — K222 Esophageal obstruction: Secondary | ICD-10-CM | POA: Diagnosis not present

## 2019-09-03 DIAGNOSIS — N184 Chronic kidney disease, stage 4 (severe): Secondary | ICD-10-CM | POA: Diagnosis not present

## 2019-09-03 DIAGNOSIS — K5731 Diverticulosis of large intestine without perforation or abscess with bleeding: Secondary | ICD-10-CM | POA: Diagnosis not present

## 2019-09-03 DIAGNOSIS — L858 Other specified epidermal thickening: Secondary | ICD-10-CM | POA: Diagnosis not present

## 2019-09-03 DIAGNOSIS — Z8719 Personal history of other diseases of the digestive system: Secondary | ICD-10-CM | POA: Diagnosis not present

## 2019-09-03 DIAGNOSIS — D631 Anemia in chronic kidney disease: Secondary | ICD-10-CM | POA: Diagnosis not present

## 2019-09-03 DIAGNOSIS — I5022 Chronic systolic (congestive) heart failure: Secondary | ICD-10-CM | POA: Diagnosis not present

## 2019-09-03 DIAGNOSIS — I130 Hypertensive heart and chronic kidney disease with heart failure and stage 1 through stage 4 chronic kidney disease, or unspecified chronic kidney disease: Secondary | ICD-10-CM | POA: Diagnosis not present

## 2019-09-03 DIAGNOSIS — K2091 Esophagitis, unspecified with bleeding: Secondary | ICD-10-CM | POA: Diagnosis not present

## 2019-09-03 DIAGNOSIS — I480 Paroxysmal atrial fibrillation: Secondary | ICD-10-CM | POA: Diagnosis not present

## 2019-09-08 DIAGNOSIS — D631 Anemia in chronic kidney disease: Secondary | ICD-10-CM | POA: Diagnosis not present

## 2019-09-08 DIAGNOSIS — E1122 Type 2 diabetes mellitus with diabetic chronic kidney disease: Secondary | ICD-10-CM | POA: Diagnosis not present

## 2019-09-08 DIAGNOSIS — N184 Chronic kidney disease, stage 4 (severe): Secondary | ICD-10-CM | POA: Diagnosis not present

## 2019-09-08 DIAGNOSIS — I480 Paroxysmal atrial fibrillation: Secondary | ICD-10-CM | POA: Diagnosis not present

## 2019-09-08 DIAGNOSIS — K2091 Esophagitis, unspecified with bleeding: Secondary | ICD-10-CM | POA: Diagnosis not present

## 2019-09-08 DIAGNOSIS — L858 Other specified epidermal thickening: Secondary | ICD-10-CM | POA: Diagnosis not present

## 2019-09-08 DIAGNOSIS — I5042 Chronic combined systolic (congestive) and diastolic (congestive) heart failure: Secondary | ICD-10-CM | POA: Diagnosis not present

## 2019-09-08 DIAGNOSIS — K222 Esophageal obstruction: Secondary | ICD-10-CM | POA: Diagnosis not present

## 2019-09-08 DIAGNOSIS — K5731 Diverticulosis of large intestine without perforation or abscess with bleeding: Secondary | ICD-10-CM | POA: Diagnosis not present

## 2019-09-08 DIAGNOSIS — I130 Hypertensive heart and chronic kidney disease with heart failure and stage 1 through stage 4 chronic kidney disease, or unspecified chronic kidney disease: Secondary | ICD-10-CM | POA: Diagnosis not present

## 2019-09-09 ENCOUNTER — Other Ambulatory Visit (HOSPITAL_COMMUNITY): Payer: Self-pay | Admitting: Internal Medicine

## 2019-09-09 DIAGNOSIS — R1310 Dysphagia, unspecified: Secondary | ICD-10-CM | POA: Diagnosis not present

## 2019-09-09 DIAGNOSIS — D62 Acute posthemorrhagic anemia: Secondary | ICD-10-CM | POA: Diagnosis not present

## 2019-09-09 DIAGNOSIS — R195 Other fecal abnormalities: Secondary | ICD-10-CM | POA: Diagnosis not present

## 2019-09-09 DIAGNOSIS — K222 Esophageal obstruction: Secondary | ICD-10-CM | POA: Diagnosis not present

## 2019-09-09 DIAGNOSIS — K2101 Gastro-esophageal reflux disease with esophagitis, with bleeding: Secondary | ICD-10-CM | POA: Diagnosis not present

## 2019-09-09 DIAGNOSIS — R066 Hiccough: Secondary | ICD-10-CM | POA: Diagnosis not present

## 2019-09-10 ENCOUNTER — Other Ambulatory Visit: Payer: Self-pay

## 2019-09-10 ENCOUNTER — Encounter (HOSPITAL_COMMUNITY): Payer: Self-pay | Admitting: Physician Assistant

## 2019-09-10 ENCOUNTER — Ambulatory Visit (HOSPITAL_COMMUNITY)
Admission: RE | Admit: 2019-09-10 | Discharge: 2019-09-10 | Disposition: A | Discharge status: Home / Self Care | Payer: Medicare HMO | Source: Ambulatory Visit | Attending: Physician Assistant | Admitting: Physician Assistant

## 2019-09-10 VITALS — BP 146/70 | HR 70 | Ht 64.0 in | Wt 150.2 lb

## 2019-09-10 DIAGNOSIS — Z87891 Personal history of nicotine dependence: Secondary | ICD-10-CM | POA: Insufficient documentation

## 2019-09-10 DIAGNOSIS — N189 Chronic kidney disease, unspecified: Secondary | ICD-10-CM | POA: Insufficient documentation

## 2019-09-10 DIAGNOSIS — I480 Paroxysmal atrial fibrillation: Secondary | ICD-10-CM | POA: Insufficient documentation

## 2019-09-10 DIAGNOSIS — I5022 Chronic systolic (congestive) heart failure: Secondary | ICD-10-CM | POA: Diagnosis not present

## 2019-09-10 DIAGNOSIS — K219 Gastro-esophageal reflux disease without esophagitis: Secondary | ICD-10-CM | POA: Diagnosis not present

## 2019-09-10 DIAGNOSIS — Z79899 Other long term (current) drug therapy: Secondary | ICD-10-CM | POA: Diagnosis not present

## 2019-09-10 DIAGNOSIS — E1122 Type 2 diabetes mellitus with diabetic chronic kidney disease: Secondary | ICD-10-CM | POA: Diagnosis not present

## 2019-09-10 DIAGNOSIS — E785 Hyperlipidemia, unspecified: Secondary | ICD-10-CM | POA: Insufficient documentation

## 2019-09-10 DIAGNOSIS — I130 Hypertensive heart and chronic kidney disease with heart failure and stage 1 through stage 4 chronic kidney disease, or unspecified chronic kidney disease: Secondary | ICD-10-CM | POA: Insufficient documentation

## 2019-09-10 DIAGNOSIS — D6869 Other thrombophilia: Secondary | ICD-10-CM | POA: Diagnosis not present

## 2019-09-10 DIAGNOSIS — B399 Histoplasmosis, unspecified: Secondary | ICD-10-CM | POA: Insufficient documentation

## 2019-09-10 DIAGNOSIS — Z7901 Long term (current) use of anticoagulants: Secondary | ICD-10-CM | POA: Insufficient documentation

## 2019-09-10 DIAGNOSIS — I428 Other cardiomyopathies: Secondary | ICD-10-CM | POA: Diagnosis not present

## 2019-09-10 NOTE — Progress Notes (Addendum)
Primary Care Physician: Leighton Ruff, MD Primary Cardiologist: Dr Haroldine Laws Primary Electrophysiologist: Dr Curt Bears Referring Physician: Device Clinic   Krista Mason is a 83 y.o. female with a history of HTN, PVCs, HLD, DM2, CKD, histoplasmosis, tobacco abuse, chronic systolic CHF, and new onset paroxysmal atrial fibrillation who presents for follow up in the [facility].  The patient was initially diagnosed with atrial fibrillation on device interrogation after undergoing pacemaker implant with Dr Curt Bears. She was prescribed amiodarone but had not started this due to concerns about side effects. Patient was hospitalized from 08/18/19-08/25/19 with a GI bleed and anemia. Patient diagnosed with severe esophagitis. Her Eliquis was resumed 08/27/19 for a CHADS2VASC score of 6. Of note, she was in SR during her admission. Patient reports that she is still feeling a little weak from her hospitalization but is slowly recovering.   Today, she denies symptoms of palpitations, chest pain, orthopnea, PND, lower extremity edema, dizziness, presyncope, syncope, snoring, daytime somnolence, bleeding, or neurologic sequela. The patient is tolerating medications without difficulties and is otherwise without complaint today.    Atrial Fibrillation Risk Factors:  she does not have symptoms or diagnosis of sleep apnea. she does not have a history of rheumatic fever.   she has a BMI of Body mass index is 25.78 kg/m.[name] Filed Weights   09/10/19 1511  Weight: 68.1 kg    Family History  Problem Relation Age of Onset  . Heart attack Father   . Heart disease Father   . Cancer Father   . Hypertension Father   . Hypertension Brother   . Stroke Brother   . Hypertension Brother   . Diabetes Son   . Hyperlipidemia Son   . Hypertension Son      Atrial Fibrillation Management history:  Previous antiarrhythmic drugs: amiodarone Previous cardioversions: none Previous  ablations: PVC ablation 2018 CHADS2VASC score: 6 Anticoagulation history: Eliquis   Past Medical History:  Diagnosis Date  . Atrial fibrillation ([place])   . Chronic combined systolic and diastolic CHF (congestive heart failure) ([place])    a. LV dysfcuntion felt to be related to PVCs  . Chronic kidney disease   . Diabetes mellitus without complication ([place])   . GERD (gastroesophageal reflux disease)   . Hx of echocardiogram    Echo (1/16):  EF 60-65%, no RWMA, Gr 1 DD, mod AI, mod MR, mild LAE  . Hyperkalemia   . Hyperlipidemia   . Hypertension   . NICM (nonischemic cardiomyopathy) ([place])    a. 12/2018 Echo: EF 35%, diff HK w/ septal-lateral dyssynchrony. Nl RV fxn; b. 07/2019 s/p MDT [id number] [name] CRT-P MRI SureScan device (ser #: [id number])  . Orthostatic hypotension   . PVC (premature ventricular contraction)    a. s/p PVC ablation in 11/2016  . Rheumatic fever    Past Surgical History:  Procedure Laterality Date  . BIOPSY  08/20/2019   Procedure: BIOPSY;  Surgeon: Otis Brace, MD;  Location: [facility];  Service: Gastroenterology;;  . BIV PACEMAKER INSERTION CRT-P N/A 07/24/2019   Procedure: BIV PACEMAKER INSERTION CRT-P;  Surgeon: Constance Haw, MD;  Location: [facility];  Service: Cardiovascular;  Laterality: N/A;  . COLONOSCOPY WITH PROPOFOL N/A 08/22/2019   Procedure: COLONOSCOPY WITH PROPOFOL;  Surgeon: Laurence Spates, MD;  Location: [facility];  Service: Endoscopy;  Laterality: N/A;  . ESOPHAGOGASTRODUODENOSCOPY (EGD) WITH PROPOFOL N/A 09/25/2018   Procedure: ESOPHAGOGASTRODUODENOSCOPY (EGD) WITH PROPOFOL;  Surgeon: Ronald Lobo, MD;  Location: [facility];  Service: Endoscopy;  Laterality: N/A;  . ESOPHAGOGASTRODUODENOSCOPY (EGD) WITH PROPOFOL N/A 10/29/2018   Procedure: ESOPHAGOGASTRODUODENOSCOPY (EGD) WITH PROPOFOL;  Surgeon: Ronald Lobo, MD;  Location: [facility];  Service: Endoscopy;  Laterality: N/A;  . ESOPHAGOGASTRODUODENOSCOPY  (EGD) WITH PROPOFOL N/A 08/20/2019   Procedure: ESOPHAGOGASTRODUODENOSCOPY (EGD) WITH PROPOFOL;  Surgeon: Otis Brace, MD;  Location: [facility];  Service: Gastroenterology;  Laterality: N/A;  . HEMOSTASIS CLIP PLACEMENT  08/22/2019   Procedure: HEMOSTASIS CLIP PLACEMENT;  Surgeon: Laurence Spates, MD;  Location: [facility];  Service: Endoscopy;;  . KNEE SURGERY Left 2009  . OVARIAN CYST REMOVAL    . POLYPECTOMY  08/22/2019   Procedure: POLYPECTOMY;  Surgeon: Laurence Spates, MD;  Location: [facility];  Service: Endoscopy;;  . PVC ABLATION N/A 11/30/2016   Procedure: PVC Ablation;  Surgeon: Will [name], MD;  Location: [facility];  Service: Cardiovascular;  Laterality: N/A;  . RIGHT HEART CATH N/A 01/31/2018   Procedure: RIGHT HEART CATH;  Surgeon: Jolaine Artist, MD;  Location: [facility];  Service: Cardiovascular;  Laterality: N/A;  . RIGHT/LEFT HEART CATH AND CORONARY ANGIOGRAPHY N/A 01/25/2017   Procedure: Right/Left Heart Cath and Coronary Angiography;  Surgeon: Jolaine Artist, MD;  Location: [facility];  Service: Cardiovascular;  Laterality: N/A;  . SAVORY DILATION N/A 09/25/2018   Procedure: SAVORY DILATION;  Surgeon: Ronald Lobo, MD;  Location: [facility];  Service: Endoscopy;  Laterality: N/A;  . SAVORY DILATION N/A 10/29/2018   Procedure: SAVORY DILATION;  Surgeon: Ronald Lobo, MD;  Location: [facility];  Service: Endoscopy;  Laterality: N/A;  . SAVORY DILATION N/A 08/20/2019   Procedure: SAVORY DILATION;  Surgeon: Otis Brace, MD;  Location: [facility];  Service: Gastroenterology;  Laterality: N/A;    Current Outpatient Medications  Medication Sig Dispense Refill  . amiodarone (PACERONE) 200 MG tablet Take 1 tablet (200 mg total) by mouth daily. 90 tablet 3  . apixaban (ELIQUIS) 2.5 MG TABS tablet Take 1 tablet (2.5 mg total) by mouth 2 (two) times daily.    [name] atorvastatin (LIPITOR) 20 MG tablet TAKE 1 TABLET BY MOUTH  EVERY DAY (Patient taking differently: Take 20 mg by mouth at bedtime. ) 90 tablet 3  . Camphor-Eucalyptus-Menthol (VICKS VAPORUB EX) Apply 1 application topically daily as needed (toe fungus).    . carvedilol (COREG) 12.5 MG tablet Take 1 tablet (12.5 mg total) by mouth 2 (two) times daily. 180 tablet 3  . Cholecalciferol (VITAMIN D) 50 MCG (2000 UT) tablet Take 2,000 Units by mouth daily.    . feeding supplement, ENSURE ENLIVE, (ENSURE ENLIVE) LIQD Take 237 mLs by mouth 2 (two) times daily between meals. 237 mL 12  . furosemide (LASIX) 20 MG tablet Take 1 tablet (20 mg total) by mouth daily as needed for fluid or edema (weight gain).    . hydrALAZINE (APRESOLINE) 25 MG tablet Take 1 tablet (25 mg total) by mouth 2 (two) times daily. 60 tablet 0  . pantoprazole (PROTONIX) 40 MG tablet Take 1 tablet (40 mg total) by mouth 2 (two) times daily before a meal. 60 tablet 1  . Polyethyl Glycol-Propyl Glycol (SYSTANE OP) Place 1 drop into both eyes 2 (two) times daily as needed (dryness).    . sucralfate (CARAFATE) 1 GM/10ML suspension Take 10 mLs (1 g total) by mouth 4 (four) times daily -  with meals and at bedtime. 420 mL 0  . vitamin B-12 (CYANOCOBALAMIN) 1000 MCG tablet Take 1 tablet (1,000 mcg total) by  mouth daily. 30 tablet 0   No current facility-administered medications for this encounter.     Allergies  Allergen Reactions  . Epinephrine Other (See Comments)    "feels like heart is beating out of chest"  . Penicillins Itching and Rash    Amoxicillin ok- IM pen is what gives reaction  Did it involve swelling of the face/tongue/throat, SOB, or low BP? No Did it involve sudden or severe rash/hives, skin peeling, or any reaction on the inside of your mouth or nose? No Did you need to seek medical attention at a hospital or doctor's office? No When did it last happen?10 + year If all above answers are "NO", may proceed with cephalosporin use.   [name] [Cefaclor] Itching  . Crestor  [Rosuvastatin Calcium] Nausea Only  . Influenza Vaccines     Swelling and redness at injection site, fever  . Nsaids     Pt states she is not taking because of kidney function  . Percocet [Oxycodone-Acetaminophen] Nausea And Vomiting  . Prednisone     Dizziness, spiked blood sugar    Social History   Socioeconomic History  . Marital status: Widowed    Spouse name: Not on file  . Number of children: Not on file  . Years of education: Not on file  . Highest education level: Not on file  Occupational History  . Not on file  Social Needs  . Financial resource strain: Not on file  . Food insecurity    Worry: Not on file    Inability: Not on file  . Transportation needs    Medical: Not on file    Non-medical: Not on file  Tobacco Use  . Smoking status: Former Smoker    Packs/day: 0.75    Years: 60.00    Pack years: 45.00    Quit date: 11/04/2016    Years since quitting: 2.8  . Smokeless tobacco: Never Used  Substance and Sexual Activity  . Alcohol use: No  . Drug use: No  . Sexual activity: Not on file  Lifestyle  . Physical activity    Days per week: Not on file    Minutes per session: Not on file  . Stress: Not on file  Relationships  . Social Herbalist on phone: Not on file    Gets together: Not on file    Attends religious service: Not on file    Active member of club or organization: Not on file    Attends meetings of clubs or organizations: Not on file    Relationship status: Not on file  . Intimate partner violence    Fear of current or ex partner: Not on file    Emotionally abused: Not on file    Physically abused: Not on file    Forced sexual activity: Not on file  Other Topics Concern  . Not on file  Social History Narrative   Lives at [facility].     ROS- All systems are reviewed and negative except as per the HPI above.  Physical Exam: Vitals:   09/10/19 1511  BP: (!) 146/70  Pulse: 70  Weight: 68.1 kg  Height:  5\' 4"  (1.626 m)    GEN- The patient is well appearing elderly female, alert and oriented x 3 today.   HEENT-head normocephalic, atraumatic, sclera clear, conjunctiva pink, hearing intact, trachea midline. Lungs- Clear to ausculation bilaterally, normal work of breathing Heart- Regular rate and rhythm, no murmurs, rubs or  gallops  GI- soft, NT, ND, + BS Extremities- no clubbing, cyanosis, or edema MS- no significant deformity or atrophy Skin- no rash or lesion Psych- euthymic mood, full affect Neuro- strength and sensation are intact   Wt Readings from Last 3 Encounters:  09/10/19 68.1 kg  08/24/19 68 kg  08/10/19 67.9 kg    EKG today demonstrates A-paced rhythm HR 70, RBBB, LAFB, PR 166, QRS 142, QTc 488  Echo 01/01/19 demonstrated  1. The left ventricle has a visually estimated ejection fraction of 35%. The cavity size was normal. There is mildly increased left ventricular wall thickness. Left ventricular diastolic Doppler parameters are consistent with impaired relaxation.  Diffuse hypokinesis with septal-lateral dyssynchrony.  2. The right ventricle has normal systolic function. The cavity was normal. There is no increase in right ventricular wall thickness.  3. Left atrial size was mildly dilated.  4. Trivial pericardial effusion is present.  5. Mild calcification of the mitral valve leaflet. Mitral valve regurgitation is mild to moderate by color flow Doppler. No evidence of mitral valve stenosis.  6. The aortic valve is tricuspid Mild calcification of the aortic valve. Aortic valve regurgitation is mild by color flow Doppler. no stenosis of the aortic valve.  7. The aortic root and ascending aorta are normal in size and structure.  8. Normal IVC size with PA systolic pressure 21 mmHg.  Epic records are reviewed at length today  Assessment and Plan:  1. Paroxysmal atrial fibrillation Patient appears to be maintaining SR. Continue Eliquis 2.5 mg BID (age, Cr >1.5). Patient  reports she has close follow up planned with GI. Given her CHADS2VASC score, would prefer that she stay on anticoagulation if possible.  Continue amiodarone 200 mg daily.  Recent labs from PCP reviewed. Hgb was 10.6 on 11/12.  This patients CHA2DS2-VASc Score and unadjusted Ischemic Stroke Rate (% per year) is equal to 9.7 % stroke rate/year from a score of 6  Above score calculated as 1 point each if present [CHF, HTN, DM, Vascular=MI/PAD/Aortic Plaque, Age if 65-74, or Female] Above score calculated as 2 points each if present [Age > 75, or Stroke/TIA/TE]   2. HTN Stable, no changes today.  3. NICM S/p CRT-P, followed by Dr Curt Bears, Dr Haroldine Laws, and the device clinic.  No signs or symptoms of fluid overload today.   Follow up with Dr Haroldine Laws and Dr Curt Bears as scheduled. AF clinic in 3 months.    [facility] [address] [place], [place] [address] [phone] 09/10/2019 4:04 PM

## 2019-09-12 DIAGNOSIS — E1122 Type 2 diabetes mellitus with diabetic chronic kidney disease: Secondary | ICD-10-CM | POA: Diagnosis not present

## 2019-09-12 DIAGNOSIS — D631 Anemia in chronic kidney disease: Secondary | ICD-10-CM | POA: Diagnosis not present

## 2019-09-12 DIAGNOSIS — I5042 Chronic combined systolic (congestive) and diastolic (congestive) heart failure: Secondary | ICD-10-CM | POA: Diagnosis not present

## 2019-09-12 DIAGNOSIS — K2091 Esophagitis, unspecified with bleeding: Secondary | ICD-10-CM | POA: Diagnosis not present

## 2019-09-12 DIAGNOSIS — K5731 Diverticulosis of large intestine without perforation or abscess with bleeding: Secondary | ICD-10-CM | POA: Diagnosis not present

## 2019-09-12 DIAGNOSIS — I130 Hypertensive heart and chronic kidney disease with heart failure and stage 1 through stage 4 chronic kidney disease, or unspecified chronic kidney disease: Secondary | ICD-10-CM | POA: Diagnosis not present

## 2019-09-12 DIAGNOSIS — L858 Other specified epidermal thickening: Secondary | ICD-10-CM | POA: Diagnosis not present

## 2019-09-12 DIAGNOSIS — N184 Chronic kidney disease, stage 4 (severe): Secondary | ICD-10-CM | POA: Diagnosis not present

## 2019-09-12 DIAGNOSIS — K222 Esophageal obstruction: Secondary | ICD-10-CM | POA: Diagnosis not present

## 2019-09-12 DIAGNOSIS — I480 Paroxysmal atrial fibrillation: Secondary | ICD-10-CM | POA: Diagnosis not present

## 2019-09-14 ENCOUNTER — Encounter (HOSPITAL_COMMUNITY): Payer: Self-pay

## 2019-09-15 ENCOUNTER — Telehealth (HOSPITAL_COMMUNITY): Payer: Self-pay | Admitting: *Deleted

## 2019-09-15 DIAGNOSIS — K5731 Diverticulosis of large intestine without perforation or abscess with bleeding: Secondary | ICD-10-CM | POA: Diagnosis not present

## 2019-09-15 DIAGNOSIS — K2091 Esophagitis, unspecified with bleeding: Secondary | ICD-10-CM | POA: Diagnosis not present

## 2019-09-15 DIAGNOSIS — N184 Chronic kidney disease, stage 4 (severe): Secondary | ICD-10-CM | POA: Diagnosis not present

## 2019-09-15 DIAGNOSIS — E1122 Type 2 diabetes mellitus with diabetic chronic kidney disease: Secondary | ICD-10-CM | POA: Diagnosis not present

## 2019-09-15 DIAGNOSIS — I130 Hypertensive heart and chronic kidney disease with heart failure and stage 1 through stage 4 chronic kidney disease, or unspecified chronic kidney disease: Secondary | ICD-10-CM | POA: Diagnosis not present

## 2019-09-15 DIAGNOSIS — K222 Esophageal obstruction: Secondary | ICD-10-CM | POA: Diagnosis not present

## 2019-09-15 DIAGNOSIS — I5042 Chronic combined systolic (congestive) and diastolic (congestive) heart failure: Secondary | ICD-10-CM | POA: Diagnosis not present

## 2019-09-15 DIAGNOSIS — L858 Other specified epidermal thickening: Secondary | ICD-10-CM | POA: Diagnosis not present

## 2019-09-15 DIAGNOSIS — D631 Anemia in chronic kidney disease: Secondary | ICD-10-CM | POA: Diagnosis not present

## 2019-09-15 DIAGNOSIS — I480 Paroxysmal atrial fibrillation: Secondary | ICD-10-CM | POA: Diagnosis not present

## 2019-09-15 NOTE — Telephone Encounter (Signed)
Left vm requesting return call to change visit to virtual due to covid19 precautions.

## 2019-09-23 ENCOUNTER — Encounter (HOSPITAL_COMMUNITY): Payer: Self-pay | Admitting: Internal Medicine

## 2019-09-23 ENCOUNTER — Telehealth (HOSPITAL_COMMUNITY): Payer: Self-pay | Admitting: *Deleted

## 2019-09-23 ENCOUNTER — Ambulatory Visit (HOSPITAL_COMMUNITY)
Admission: RE | Admit: 2019-09-23 | Discharge: 2019-09-23 | Disposition: A | Discharge status: Home / Self Care | Payer: Medicare HMO | Source: Ambulatory Visit | Attending: Internal Medicine | Admitting: Internal Medicine

## 2019-09-23 ENCOUNTER — Other Ambulatory Visit: Payer: Self-pay

## 2019-09-23 VITALS — BP 160/88 | HR 70 | Wt 154.0 lb

## 2019-09-23 DIAGNOSIS — R55 Syncope and collapse: Secondary | ICD-10-CM | POA: Insufficient documentation

## 2019-09-23 DIAGNOSIS — I428 Other cardiomyopathies: Secondary | ICD-10-CM | POA: Diagnosis not present

## 2019-09-23 DIAGNOSIS — Z7901 Long term (current) use of anticoagulants: Secondary | ICD-10-CM | POA: Insufficient documentation

## 2019-09-23 DIAGNOSIS — I5022 Chronic systolic (congestive) heart failure: Secondary | ICD-10-CM | POA: Diagnosis not present

## 2019-09-23 DIAGNOSIS — I2510 Atherosclerotic heart disease of native coronary artery without angina pectoris: Secondary | ICD-10-CM

## 2019-09-23 DIAGNOSIS — Z8249 Family history of ischemic heart disease and other diseases of the circulatory system: Secondary | ICD-10-CM | POA: Insufficient documentation

## 2019-09-23 DIAGNOSIS — N183 Chronic kidney disease, stage 3 unspecified: Secondary | ICD-10-CM | POA: Diagnosis not present

## 2019-09-23 DIAGNOSIS — I130 Hypertensive heart and chronic kidney disease with heart failure and stage 1 through stage 4 chronic kidney disease, or unspecified chronic kidney disease: Secondary | ICD-10-CM | POA: Insufficient documentation

## 2019-09-23 DIAGNOSIS — I080 Rheumatic disorders of both mitral and aortic valves: Secondary | ICD-10-CM | POA: Insufficient documentation

## 2019-09-23 DIAGNOSIS — E1151 Type 2 diabetes mellitus with diabetic peripheral angiopathy without gangrene: Secondary | ICD-10-CM | POA: Diagnosis not present

## 2019-09-23 DIAGNOSIS — I5042 Chronic combined systolic (congestive) and diastolic (congestive) heart failure: Secondary | ICD-10-CM | POA: Diagnosis not present

## 2019-09-23 DIAGNOSIS — E1122 Type 2 diabetes mellitus with diabetic chronic kidney disease: Secondary | ICD-10-CM | POA: Insufficient documentation

## 2019-09-23 DIAGNOSIS — Z79899 Other long term (current) drug therapy: Secondary | ICD-10-CM | POA: Diagnosis not present

## 2019-09-23 DIAGNOSIS — I480 Paroxysmal atrial fibrillation: Secondary | ICD-10-CM

## 2019-09-23 DIAGNOSIS — I447 Left bundle-branch block, unspecified: Secondary | ICD-10-CM | POA: Diagnosis not present

## 2019-09-23 DIAGNOSIS — Z809 Family history of malignant neoplasm, unspecified: Secondary | ICD-10-CM | POA: Diagnosis not present

## 2019-09-23 DIAGNOSIS — E785 Hyperlipidemia, unspecified: Secondary | ICD-10-CM | POA: Insufficient documentation

## 2019-09-23 DIAGNOSIS — R079 Chest pain, unspecified: Secondary | ICD-10-CM | POA: Insufficient documentation

## 2019-09-23 DIAGNOSIS — Z87891 Personal history of nicotine dependence: Secondary | ICD-10-CM | POA: Insufficient documentation

## 2019-09-23 DIAGNOSIS — I493 Ventricular premature depolarization: Secondary | ICD-10-CM | POA: Insufficient documentation

## 2019-09-23 DIAGNOSIS — D472 Monoclonal gammopathy: Secondary | ICD-10-CM | POA: Diagnosis not present

## 2019-09-23 DIAGNOSIS — Z823 Family history of stroke: Secondary | ICD-10-CM | POA: Diagnosis not present

## 2019-09-23 DIAGNOSIS — Z833 Family history of diabetes mellitus: Secondary | ICD-10-CM | POA: Diagnosis not present

## 2019-09-23 DIAGNOSIS — N1832 Chronic kidney disease, stage 3b: Secondary | ICD-10-CM

## 2019-09-23 DIAGNOSIS — K2100 Gastro-esophageal reflux disease with esophagitis, without bleeding: Secondary | ICD-10-CM | POA: Insufficient documentation

## 2019-09-23 LAB — CBC
HCT: 31.6 % — ABNORMAL LOW (ref 36.0–46.0)
Hemoglobin: 10.1 g/dL — ABNORMAL LOW (ref 12.0–15.0)
MCH: 29.3 pg (ref 26.0–34.0)
MCHC: 32 g/dL (ref 30.0–36.0)
MCV: 91.6 fL (ref 80.0–100.0)
Platelets: 248 10*3/uL (ref 150–400)
RBC: 3.45 MIL/uL — ABNORMAL LOW (ref 3.87–5.11)
RDW: 15.1 % (ref 11.5–15.5)
WBC: 8.2 10*3/uL (ref 4.0–10.5)
nRBC: 0 % (ref 0.0–0.2)

## 2019-09-23 LAB — BASIC METABOLIC PANEL
Anion gap: 9 (ref 5–15)
BUN: 42 mg/dL — ABNORMAL HIGH (ref 8–23)
CO2: 22 mmol/L (ref 22–32)
Calcium: 9.8 mg/dL (ref 8.9–10.3)
Chloride: 103 mmol/L (ref 98–111)
Creatinine, Ser: 2.35 mg/dL — ABNORMAL HIGH (ref 0.44–1.00)
GFR calc Af Amer: 22 mL/min — ABNORMAL LOW (ref >60)
GFR calc non Af Amer: 19 mL/min — ABNORMAL LOW (ref >60)
Glucose, Bld: 137 mg/dL — ABNORMAL HIGH (ref 70–99)
Potassium: 5.5 mmol/L — ABNORMAL HIGH (ref 3.5–5.1)
Sodium: 134 mmol/L — ABNORMAL LOW (ref 135–145)

## 2019-09-23 LAB — BRAIN NATRIURETIC PEPTIDE: B Natriuretic Peptide: 343.6 pg/mL — ABNORMAL HIGH (ref 0.0–100.0)

## 2019-09-23 MED ORDER — FUROSEMIDE 20 MG PO TABS: 40.0000 mg | ORAL_TABLET | ORAL | 11 refills | Status: DC

## 2019-09-23 MED ORDER — FUROSEMIDE 20 MG PO TABS: 20.0000 mg | ORAL_TABLET | ORAL | 11 refills | Status: DC

## 2019-09-23 NOTE — Progress Notes (Signed)
ReDS Vest / Clip - 09/23/19 1100      ReDS Vest / Clip   Station Marker  A    Ruler Value  25    ReDS Value Range  (!) High volume overload    ReDS Actual Value  49    Anatomical Comments  sitting

## 2019-09-23 NOTE — Patient Instructions (Addendum)
CHANGE Lasix to 40 mg, three times weekly (Monday, Wednesday, and Friday)  Labs today We will only contact you if something comes back abnormal or we need to make some changes. Otherwise no news is good news!  Your physician recommends that you schedule a follow-up appointment in: 3 months with Dr Haroldine Laws and echo  Your physician has requested that you have an echocardiogram. Echocardiography is a painless test that uses sound waves to create images of your heart. It provides your doctor with information about the size and shape of your heart and how well your heart's chambers and valves are working. This procedure takes approximately one hour. There are no restrictions for this procedure.  Do the following things EVERYDAY: 1) Weigh yourself in the morning before breakfast. Write it down and keep it in a log. 2) Take your medicines as prescribed 3) Eat low salt foods-Limit salt (sodium) to 2000 mg per day.  4) Stay as active as you can everyday 5) Limit all fluids for the day to less than 2 liters  At the [facility], you and your health needs are our priority. As part of our continuing mission to provide you with exceptional heart care, we have created designated Provider Care Teams. These Care Teams include your primary Cardiologist (physician) and Advanced Practice Providers (APPs- Physician Assistants and Nurse Practitioners) who all work together to provide you with the care you need, when you need it.   You may see any of the following providers on your designated Care Team at your next follow up: [name] Dr Glori Bickers . Dr Loralie Champagne . Darrick Grinder, NP . Lyda Jester, PA . Audry Riles, PharmD   Please be sure to bring in all your medications bottles to every appointment.

## 2019-09-23 NOTE — Progress Notes (Signed)
ReDS Vest / Clip - 09/23/19 1200      ReDS Vest / Clip   Station Marker  A    Ruler Value  25    ReDS Value Range  (!) High volume overload    ReDS Actual Value  49    Anatomical Comments  sitting

## 2019-09-23 NOTE — Telephone Encounter (Signed)
Patient approved for eliquis assistance through 10/22/19 case #[id number]

## 2019-09-23 NOTE — Progress Notes (Signed)
Advanced Heart Failure Clinic Note    Primary Cardiologist: Dr Krista Mason Nephrologist: Dr Krista Mason  HPI: Krista Mason is a 83 y.o. female with a history of HTN, HL, CKD 3, moderate mitral regurgitation, DM, and chronic systolic heart failure .  Admitted 01/28/18 with dyspnea. ECHO had gone down to 35-40% (previously 45%). Had RHC with normal filling pressures and cardiac output. Creatinine peaked at 2.1 so lasix was held for few days after discharge. Myeloma panel - 0.3 M protein noted. PYP scan was negative for TTR. Has seen Dr. Beryle Mason and not felt to have myeloma.   Has had CPX test 02/2018 which showed - submax test with moderate to severe functional limitation due to HF, lung disease and deconditioning. However, markedly elevated Ve/VCO2 slope points to HF as being the predominant limitation and would suggest work-up for advanced therapies if patient is a candidate.   She presents today for regular follow up. We last saw her in 8/20 and had ongoing NYHA II-III symptoms.  Referred to EP to consider CRT. Saw Dr. Curt Mason in April and she wanted to hold off on deciding until after Covid situation improved.  Patient was hospitalized from 08/18/19-08/25/19 with a GI bleed and anemia. Patient diagnosed with severe esophagitis. Her Eliquis was resumed 08/27/19 for a CHADS2VASC score of 6.  Underwent MDT CRT-D on 07/24/19 subsequently found to have episodes of AF.  Amiodarone started.    Echo 3/20 shows 35-40% moderate MR. RV ok Personally reviewed   5/19 CPX FVC 1.97 (73%)    FEV1 1.24 (61%)     FEV1/FVC 63 (81%)     MVV 49 (59%)     Resting HR: 58 Peak HR: 110  (78% age predicted max HR) BP rest: 152/66 BP peak: 214/70 Peak VO2: 9.6 (57% predicted peak VO2) VE/VCO2 slope: 50 OUES: 0.87 Peak RER: 0.93 VE/MVV: 70% O2pulse: 6  (75% predicted O2pulse)   01/2018 PYP scan negative for TTR  RHC 01/2018  RA = 3 RV = 29/3 PA = 25/12 (16) PCW = 8 Fick cardiac output/index  = 5.3/3.0 PVR = 1.5 WU Ao sat = 97% PA sat = 65%, 67% Assessment: 1. Normal filling pressures and outputs  LHC 01/2017  Heavily calcified but non-obstructive CAD. 50% RCA and 30% LAD.   ECHO 01/2018  EF 35%  Severe concentric LVH with diffuse hypokinesis and paradoxical   septal motion. Thickened aortic and mitral valve leaflets. Mild   aortic regurgitation and moderate mitral regurgitation. Trivial   posteriorly located pericardial effusion.   Evaluation for cardiac amyloidosis should be considered .  ECHO 2018, the LVEF is improved from   35-40% up to 40-45%. There is persistent moderate mitral   regurgitation.  Review of systems complete and found to be negative unless listed in HPI.    SH:  Social History   Socioeconomic History  . Marital status: Widowed    Spouse name: Not on file  . Number of children: Not on file  . Years of education: Not on file  . Highest education level: Not on file  Occupational History  . Not on file  Social Needs  . Financial resource strain: Not on file  . Food insecurity    Worry: Not on file    Inability: Not on file  . Transportation needs    Medical: Not on file    Non-medical: Not on file  Tobacco Use  . Smoking status: Former Smoker    Packs/day: 0.75    Years:  60.00    Pack years: 45.00    Quit date: 11/04/2016    Years since quitting: 2.8  . Smokeless tobacco: Never Used  Substance and Sexual Activity  . Alcohol use: No  . Drug use: No  . Sexual activity: Not on file  Lifestyle  . Physical activity    Days per week: Not on file    Minutes per session: Not on file  . Stress: Not on file  Relationships  . Social Herbalist on phone: Not on file    Gets together: Not on file    Attends religious service: Not on file    Active member of club or organization: Not on file    Attends meetings of clubs or organizations: Not on file    Relationship status: Not on file  . Intimate partner violence    Fear of  current or ex partner: Not on file    Emotionally abused: Not on file    Physically abused: Not on file    Forced sexual activity: Not on file  Other Topics Concern  . Not on file  Social History Narrative   Lives at [name].    FH:  Family History  Problem Relation Age of Onset  . Heart attack Father   . Heart disease Father   . Cancer Father   . Hypertension Father   . Hypertension Brother   . Stroke Brother   . Hypertension Brother   . Diabetes Son   . Hyperlipidemia Son   . Hypertension Son     Past Medical History:  Diagnosis Date  . Atrial fibrillation ([name])   . Chronic combined systolic and diastolic CHF (congestive heart failure) ([name])    a. LV dysfcuntion felt to be related to PVCs  . Chronic kidney disease   . Diabetes mellitus without complication ([name])   . GERD (gastroesophageal reflux disease)   . Hx of echocardiogram    Echo (1/16):  EF 60-65%, no RWMA, Gr 1 DD, mod AI, mod MR, mild LAE  . Hyperkalemia   . Hyperlipidemia   . Hypertension   . NICM (nonischemic cardiomyopathy) ([name])    a. 12/2018 Echo: EF 35%, diff HK w/ septal-lateral dyssynchrony. Nl RV fxn; b. 07/2019 s/p MDT [id number] Krista Mason CRT-P MRI SureScan device (ser #: [id number])  . Orthostatic hypotension   . PVC (premature ventricular contraction)    a. s/p PVC ablation in 11/2016  . Rheumatic fever     Current Outpatient Medications  Medication Sig Dispense Refill  . amiodarone (PACERONE) 200 MG tablet Take 1 tablet (200 mg total) by mouth daily. 90 tablet 3  . apixaban (ELIQUIS) 2.5 MG TABS tablet Take 1 tablet (2.5 mg total) by mouth 2 (two) times daily.    . Camphor-Eucalyptus-Menthol (VICKS VAPORUB EX) Apply 1 application topically daily as needed (toe fungus).    . carvedilol (COREG) 12.5 MG tablet Take 1 tablet (12.5 mg total) by mouth 2 (two) times daily. 180 tablet 3  . Cholecalciferol (VITAMIN D) 50 MCG (2000 UT) tablet Take 2,000 Units by mouth daily.    .  feeding supplement, ENSURE ENLIVE, (ENSURE ENLIVE) LIQD Take 237 mLs by mouth 2 (two) times daily between meals. 237 mL 12  . furosemide (LASIX) 20 MG tablet Take 1 tablet (20 mg total) by mouth daily as needed for fluid or edema (weight gain).    . hydrALAZINE (APRESOLINE) 25 MG tablet Take 1 tablet (25 mg total) by  mouth 2 (two) times daily. 60 tablet 0  . pantoprazole (PROTONIX) 40 MG tablet Take 1 tablet (40 mg total) by mouth 2 (two) times daily before a meal. 60 tablet 1  . Polyethyl Glycol-Propyl Glycol (SYSTANE OP) Place 1 drop into both eyes 2 (two) times daily as needed (dryness).    . vitamin B-12 (CYANOCOBALAMIN) 1000 MCG tablet Take 1 tablet (1,000 mcg total) by mouth daily. 30 tablet 0  . atorvastatin (LIPITOR) 20 MG tablet TAKE 1 TABLET BY MOUTH EVERY DAY (Patient taking differently: Take 20 mg by mouth at bedtime. ) 90 tablet 3   No current facility-administered medications for this encounter.     Vitals:   09/23/19 1134  Weight: 69.9 kg (154 lb)   Filed Weights   09/23/19 1134  Weight: 69.9 kg (154 lb)   Wt Readings from Last 3 Encounters:  09/23/19 69.9 kg (154 lb)  09/10/19 68.1 kg (150 lb 3.2 oz)  08/24/19 68 kg (149 lb 14.4 oz)   PHYSICAL EXAM: General:  Elderly . No resp difficulty HEENT: normal Neck: supple. JVP 8. Carotids 2+ bilat; no bruits. No lymphadenopathy or thryomegaly appreciated. Cor: PMI nondisplaced. Regular rate & rhythm. No rubs, gallops or murmurs. Lungs: clear Abdomen: soft, nontender, nondistended. No hepatosplenomegaly. No bruits or masses. Good bowel sounds. Extremities: no cyanosis, clubbing, rash, trace edema Neuro: alert & orientedx3, cranial nerves grossly intact. moves all 4 extremities w/o difficulty. Affect pleasant   ECG 06/11/19 NSR 78 1AVB LBBB 145ms + PACs Personally reviewed  ECG 11/19 NSR with biv pacing QRS 139ms Personally reviewed   ReDS today EF 42%  ICD interrogation today: 100% bivpacing. No further AF on amio. No  VT.  Activity level < 1hr Personally reviewed    ASSESSMENT & PLAN:  1.Chronic combined CHF: NICM s/p PVC ablation. Now with LBBB. Echo8/18EF 40% (unchanged from prior). - volume status low and cardiac output normal on RHC in 4/19 - Echo 01/2018 LVEF 35-40%, Moderate MR, LVH. Concern for amyloid.  -PYP scan negative for TTR amyloid. Has MGUS and has seen Dr. Beryle Mason but low suspicion for AL amyloid/myeloma. 24 hour urine with no Mspike - Echo 3/20 EF stable at 35-40% with moderate MR - CPX 5/19 poor efforts RER 0.9 but suggests severe multifactorial limitation but HF predominates. Unfortunately, not candidate for advanced therapies due to age and comorbidities. - s/p CRT-D on 07/24/19 - initially said she felt better but then she said she feels no different with CRT - QRS remains 140 ms (no change with CRT) - Volume status elevated after lasix stopped in hospital - Axtivity level minimal.   - Restart lasix 40mg  MWF - Encouraged her to be more active walk 46min tid  - Repeat echo at next visit. Consider repeat CPX.  - I think depression likely playing a role in functional limitation  2. Chest pain - Last cath 01/2017 with heavily calcified but non-obstructive CAD. 50% RCA and 30% LAD. - No s/s ischemia -CT chest stable with old granulomatous disease. - Continue ASA and statin  3. PVCs - S/p ablation. Off Amiodarone therapy. PVC burden 2.1%01/2017. No change.   4. LBBB - EF stable 35-40% on echo 3/20 + moderate MR. LBBB ~141ms. - s/p CRT 07/24/19  5. Drop attacks/syncope - No recent occurrences. 30 day monitor unremarkable 10/2017.  6.CKD III - Baseline creatinine 1.7-1.9. Last value in 10/19 creatinine 2.3 - Following with Dr Krista Mason - Recehck today   7. HTN - Improved  8. PAD -  ABIs showed moderate disease BLE. Has seen [facility]. Medical therapy for now. No change.   9. PAF - quiescent on amio. Continue Eliquis  - check hgb today in setting of recent GIB   Glori Bickers, MD  11:34 AM

## 2019-09-29 ENCOUNTER — Telehealth (HOSPITAL_COMMUNITY): Payer: Self-pay

## 2019-09-29 DIAGNOSIS — I480 Paroxysmal atrial fibrillation: Secondary | ICD-10-CM

## 2019-09-29 DIAGNOSIS — I5022 Chronic systolic (congestive) heart failure: Secondary | ICD-10-CM

## 2019-09-29 NOTE — Telephone Encounter (Signed)
Pt left vm on triage line asking for results of blood work and she had a question about labs.  When called back, no answer, unable to leave vm.

## 2019-09-30 NOTE — Telephone Encounter (Signed)
Received message from triage line that patient called back.  Returned call, no answer. Left vm

## 2019-09-30 NOTE — Telephone Encounter (Signed)
LM for patient to return call.

## 2019-10-01 MED ORDER — FUROSEMIDE 40 MG PO TABS: 40.0000 mg | ORAL_TABLET | ORAL | 6 refills | Status: DC

## 2019-10-01 NOTE — Telephone Encounter (Signed)
Pt called back regarding lab results.  She is aware of results and states that she is not taking any K supplements, she did increase her Furosemide at last OV as instructed.  She states she is seeing Dr Curt Bears on 1/5 and would just like to wait for repeat labs at that time as she is trying to only go out when absolutely necessary due to covid, pt also request CBC be repeated at that time per Dr Cristina Gong, order placed

## 2019-10-01 NOTE — Addendum Note (Signed)
Encounter addended by: Scarlette Calico, RN on: 10/01/2019 10:26 AM  Actions taken: Pharmacy for encounter modified, Order list changed

## 2019-10-14 ENCOUNTER — Other Ambulatory Visit: Payer: Self-pay

## 2019-10-23 ENCOUNTER — Other Ambulatory Visit (HOSPITAL_COMMUNITY): Payer: Self-pay | Admitting: Internal Medicine

## 2019-10-27 ENCOUNTER — Other Ambulatory Visit: Payer: Self-pay

## 2019-10-27 ENCOUNTER — Other Ambulatory Visit: Payer: Medicare HMO

## 2019-10-27 ENCOUNTER — Encounter: Payer: Self-pay | Admitting: Cardiology

## 2019-10-27 ENCOUNTER — Ambulatory Visit: Payer: Medicare HMO | Admitting: Cardiology

## 2019-10-27 VITALS — BP 164/82 | HR 70 | Ht 64.0 in | Wt 151.0 lb

## 2019-10-27 DIAGNOSIS — I5022 Chronic systolic (congestive) heart failure: Secondary | ICD-10-CM

## 2019-10-27 DIAGNOSIS — I480 Paroxysmal atrial fibrillation: Secondary | ICD-10-CM

## 2019-10-27 DIAGNOSIS — I428 Other cardiomyopathies: Secondary | ICD-10-CM | POA: Diagnosis not present

## 2019-10-27 MED ORDER — HYDRALAZINE HCL 50 MG PO TABS: 50.0000 mg | ORAL_TABLET | Freq: Three times a day (TID) | ORAL | 3 refills | Status: DC

## 2019-10-27 NOTE — Patient Instructions (Addendum)
Medication Instructions:  Your physician has recommended you make the following change in your medication:  1. INCREASE Hydralazine to 50 mg twice a day  *If you need a refill on your cardiac medications before your next appointment, please call your pharmacy*  Labwork: None ordered  Testing/Procedures: None ordered  Follow-Up: Remote monitoring is used to monitor your Pacemaker or ICD from home. This monitoring reduces the number of office visits required to check your device to one time per year. It allows [place] to keep an eye on the functioning of your device to ensure it is working properly. You are scheduled for a device check from home on 02/02/20. You may send your transmission at any time that day. If you have a wireless device, the transmission will be sent automatically. After your physician reviews your transmission, you will receive a postcard with your next transmission date.  At [facility], you and your health needs are our priority.  As part of our continuing mission to provide you with exceptional heart care, we have created designated Provider Care Teams.  These Care Teams include your primary Cardiologist (physician) and Advanced Practice Providers (APPs -  Physician Assistants and Nurse Practitioners) who all work together to provide you with the care you need, when you need it.  You will need a follow up appointment in 12 months.  Please call our office 2 months in advance to schedule this appointment.  You may see Dr Curt Bears or one of the following Advanced Practice Providers on your designated Care Team:    Chanetta Marshall, NP  Tommye Standard, PA-C  [name], [place]  Thank you for choosing [facility]!!   Trinidad Curet, RN [phone]  Any Other Special Instructions Will Be Listed Below (If Applicable).

## 2019-10-27 NOTE — Progress Notes (Signed)
Electrophysiology Office Note   Date:  10/27/2019   ID:  Linden, Mikes Dec 02, 1935, MRN [id number]  PCP:  Leighton Ruff, MD  Cardiologist:  [place] Primary Electrophysiologist:  Latrenda Irani [name], MD    No chief complaint on file.    History of Present Illness: RICKETTA COLANTONIO is a 84 y.o. female who presents today for electrophysiology evaluation.   Hx of HTN, PVCs, HLD, DM2, CKD, histoplasmosis, and tobacco abuse. Recently diagnosed with CHF. Myoview without ischemia. Holter with 29% PVCs. She has been having episodes of fatigue and shortness of breath.  That being said, her SOB has improved since starting her lasix. She is sleeping better but does still complain of palpitations. She previously had to sleep in a chair but was able to sleep on one pillow recently. Had PVC ablation 11/26/16 with ablation inferior the RCC. Ablation was complicated by LBBB.  She is now status post Medtronic CRT-P implanted 07/24/2019.  Today, denies symptoms of palpitations, chest pain, shortness of breath, orthopnea, PND, lower extremity edema, claudication, dizziness, presyncope, syncope, bleeding, or neurologic sequela. The patient is tolerating medications without difficulties.  She continues to have episodes of weakness and fatigue.  She does feel that this has been improved after her CRT implant.    Past Medical History:  Diagnosis Date  . Atrial fibrillation ([place])   . Chronic combined systolic and diastolic CHF (congestive heart failure) ([place])    a. LV dysfcuntion felt to be related to PVCs  . Chronic kidney disease   . Diabetes mellitus without complication ([place])   . GERD (gastroesophageal reflux disease)   . Hx of echocardiogram    Echo (1/16):  EF 60-65%, no RWMA, Gr 1 DD, mod AI, mod MR, mild LAE  . Hyperkalemia   . Hyperlipidemia   . Hypertension   . NICM (nonischemic cardiomyopathy) ([place])    a. 12/2018 Echo: EF 35%, diff HK w/ septal-lateral dyssynchrony. Nl RV fxn; b. 07/2019 s/p  MDT [id number] [name] CRT-P MRI SureScan device (ser #: [id number])  . Orthostatic hypotension   . PVC (premature ventricular contraction)    a. s/p PVC ablation in 11/2016  . Rheumatic fever    Past Surgical History:  Procedure Laterality Date  . BIOPSY  08/20/2019   Procedure: BIOPSY;  Surgeon: Otis Brace, MD;  Location: [facility];  Service: Gastroenterology;;  . BIV PACEMAKER INSERTION CRT-P N/A 07/24/2019   Procedure: BIV PACEMAKER INSERTION CRT-P;  Surgeon: Constance Haw, MD;  Location: [facility];  Service: Cardiovascular;  Laterality: N/A;  . COLONOSCOPY WITH PROPOFOL N/A 08/22/2019   Procedure: COLONOSCOPY WITH PROPOFOL;  Surgeon: Laurence Spates, MD;  Location: [facility];  Service: Endoscopy;  Laterality: N/A;  . ESOPHAGOGASTRODUODENOSCOPY (EGD) WITH PROPOFOL N/A 09/25/2018   Procedure: ESOPHAGOGASTRODUODENOSCOPY (EGD) WITH PROPOFOL;  Surgeon: Ronald Lobo, MD;  Location: [facility];  Service: Endoscopy;  Laterality: N/A;  . ESOPHAGOGASTRODUODENOSCOPY (EGD) WITH PROPOFOL N/A 10/29/2018   Procedure: ESOPHAGOGASTRODUODENOSCOPY (EGD) WITH PROPOFOL;  Surgeon: Ronald Lobo, MD;  Location: [facility];  Service: Endoscopy;  Laterality: N/A;  . ESOPHAGOGASTRODUODENOSCOPY (EGD) WITH PROPOFOL N/A 08/20/2019   Procedure: ESOPHAGOGASTRODUODENOSCOPY (EGD) WITH PROPOFOL;  Surgeon: Otis Brace, MD;  Location: [facility];  Service: Gastroenterology;  Laterality: N/A;  . HEMOSTASIS CLIP PLACEMENT  08/22/2019   Procedure: HEMOSTASIS CLIP PLACEMENT;  Surgeon: Laurence Spates, MD;  Location: [facility]COPY;  Service: Endoscopy;;  . KNEE SURGERY Left 2009  . OVARIAN CYST REMOVAL    . POLYPECTOMY  08/22/2019  Procedure: POLYPECTOMY;  Surgeon: Laurence Spates, MD;  Location: [facility];  Service: Endoscopy;;  . PVC ABLATION N/A 11/30/2016   Procedure: PVC Ablation;  Surgeon: Jermall Isaacson [name], MD;  Location: [facility];  Service: Cardiovascular;   Laterality: N/A;  . RIGHT HEART CATH N/A 01/31/2018   Procedure: RIGHT HEART CATH;  Surgeon: Jolaine Artist, MD;  Location: [facility];  Service: Cardiovascular;  Laterality: N/A;  . RIGHT/LEFT HEART CATH AND CORONARY ANGIOGRAPHY N/A 01/25/2017   Procedure: Right/Left Heart Cath and Coronary Angiography;  Surgeon: Jolaine Artist, MD;  Location: [facility];  Service: Cardiovascular;  Laterality: N/A;  . SAVORY DILATION N/A 09/25/2018   Procedure: SAVORY DILATION;  Surgeon: Ronald Lobo, MD;  Location: [facility];  Service: Endoscopy;  Laterality: N/A;  . SAVORY DILATION N/A 10/29/2018   Procedure: SAVORY DILATION;  Surgeon: Ronald Lobo, MD;  Location: [facility];  Service: Endoscopy;  Laterality: N/A;  . SAVORY DILATION N/A 08/20/2019   Procedure: SAVORY DILATION;  Surgeon: Otis Brace, MD;  Location: [facility];  Service: Gastroenterology;  Laterality: N/A;     Current Outpatient Medications  Medication Sig Dispense Refill  . amiodarone (PACERONE) 200 MG tablet Take 1 tablet (200 mg total) by mouth daily. 90 tablet 3  . apixaban (ELIQUIS) 2.5 MG TABS tablet Take 1 tablet (2.5 mg total) by mouth 2 (two) times daily.    [name] atorvastatin (LIPITOR) 20 MG tablet TAKE 1 TABLET BY MOUTH EVERY DAY 90 tablet 3  . Camphor-Eucalyptus-Menthol (VICKS VAPORUB EX) Apply 1 application topically daily as needed (toe fungus).    . carvedilol (COREG) 12.5 MG tablet Take 1 tablet (12.5 mg total) by mouth 2 (two) times daily. 180 tablet 3  . Cholecalciferol (VITAMIN D) 50 MCG (2000 UT) tablet Take 2,000 Units by mouth daily.    . feeding supplement, ENSURE ENLIVE, (ENSURE ENLIVE) LIQD Take 237 mLs by mouth 2 (two) times daily between meals. 237 mL 12  . furosemide (LASIX) 40 MG tablet Take 1 tablet (40 mg total) by mouth 3 (three) times a week. MONDAYS WEDNESDAYS AND FRIDAYS 15 tablet 6  . hydrALAZINE (APRESOLINE) 25 MG tablet Take 25 mg by mouth 2 (two) times daily.    .  pantoprazole (PROTONIX) 40 MG tablet Take 1 tablet (40 mg total) by mouth 2 (two) times daily before a meal. 60 tablet 1  . Polyethyl Glycol-Propyl Glycol (SYSTANE OP) Place 1 drop into both eyes 2 (two) times daily as needed (dryness).    . vitamin B-12 (CYANOCOBALAMIN) 1000 MCG tablet Take 1 tablet (1,000 mcg total) by mouth daily. 30 tablet 0   No current facility-administered medications for this visit.    Allergies:   Epinephrine, Penicillins, Ceclor [cefaclor], Crestor [rosuvastatin calcium], Influenza vaccines, Nsaids, Percocet [oxycodone-acetaminophen], and Prednisone   Social History:  The patient  reports that she quit smoking about 2 years ago. She has a 45.00 pack-year smoking history. She has never used smokeless tobacco. She reports that she does not drink alcohol or use drugs.   Family History:  The patient's family history includes Cancer in her father; Diabetes in her son; Heart attack in her father; Heart disease in her father; Hyperlipidemia in her son; Hypertension in her brother, brother, father, and son; Stroke in her brother.    ROS:  Please see the history of present illness.   Otherwise, review of systems is positive for none.   All other systems are reviewed and negative.  PHYSICAL EXAM: VS:  BP (!) 164/82   Pulse 70   Ht 5\' 4"  (1.626 m)   Wt 151 lb (68.5 kg)   SpO2 98%   BMI 25.92 kg/m  , BMI Body mass index is 25.92 kg/m. GEN: Well nourished, well developed, in no acute distress  HEENT: normal  Neck: no JVD, carotid bruits, or masses Cardiac: RRR; no murmurs, rubs, or gallops,no edema  Respiratory:  clear to auscultation bilaterally, normal work of breathing GI: soft, nontender, nondistended, + BS MS: no deformity or atrophy  Skin: warm and dry, device site well healed Neuro:  Strength and sensation are intact Psych: euthymic mood, full affect  EKG:  EKG is ordered today. Personal review of the ekg ordered shows sinus rhythm, ventricular  paced  Personal review of the device interrogation today. Results in [place]: 08/06/2019: Magnesium 2.4 08/18/2019: ALT 17 08/21/2019: TSH 1.720 09/23/2019: B Natriuretic Peptide 343.6; BUN 42; Creatinine, Ser 2.35; Hemoglobin 10.1; Platelets 248; Potassium 5.5; Sodium 134    Lipid Panel  No results found for: CHOL, TRIG, HDL, CHOLHDL, VLDL, LDLCALC, LDLDIRECT   Wt Readings from Last 3 Encounters:  10/27/19 151 lb (68.5 kg)  09/23/19 154 lb (69.9 kg)  09/10/19 150 lb 3.2 oz (68.1 kg)      Other studies Reviewed: Additional studies/ records that were reviewed today include: TTE 01/01/19  Review of the above records today demonstrates:   1. The left ventricle has a visually estimated ejection fraction of 35%. The cavity size was normal. There is mildly increased left ventricular wall thickness. Left ventricular diastolic Doppler parameters are consistent with impaired relaxation.  Diffuse hypokinesis with septal-lateral dyssynchrony.  2. The right ventricle has normal systolic function. The cavity was normal. There is no increase in right ventricular wall thickness.  3. Left atrial size was mildly dilated.  4. Trivial pericardial effusion is present.  5. Mild calcification of the mitral valve leaflet. Mitral valve regurgitation is mild to moderate by color flow Doppler. No evidence of mitral valve stenosis.  6. The aortic valve is tricuspid Mild calcification of the aortic valve. Aortic valve regurgitation is mild by color flow Doppler. no stenosis of the aortic valve.  7. The aortic root and ascending aorta are normal in size and structure.  8. Normal IVC size with PA systolic pressure 21 mmHg.   Holter 01/31/17 - Personally reviewed Frequent PACs and PVC with periods of wandering atrial pacemaker and several brief runs SVT.   PVC burden down to 2.1%  Cath 01/25/17 1. Heavily calcified coronaries with non-obstructive CAD 2. EF 45-50%  3, Normal hemodynamics 4.  Significantly decreased PVC burden in cath lab from previous  ASSESSMENT AND PLAN:  1.  PVCs: PVC ablation 11/26/2016.  Decreased burden, though not completely ablated.  No changes.    2. Nonischemic cardiomyopathy: Ejection fraction down to 35 to 40%.  Currently on optimal medical therapy.  Status post Medtronic CRT-D implanted 07/24/2019.  Device functioning appropriately.  No changes.    3. Hyperlipidemia: Continue Lipitor  4.  Paroxysmal atrial fibrillation: CHA2DS2-VASc of 6.  Currently on Eliquis.  She has had minimal episodes since her device was implanted.  We Jeral Zick continue to monitor.  Current medicines are reviewed at length with the patient today.   The patient does not have concerns regarding her medicines.  The following changes were made today: Increase hydralazine  Labs/ tests ordered today include:  Orders Placed This Encounter  Procedures  . EKG  12-Lead     Disposition:   FU with Kaiulani Sitton 12 months  Signed, Jandiel Magallanes [name], MD  10/27/2019 2:57 PM     [address] [place] [address] [phone] (office) [phone] (fax)

## 2019-10-28 LAB — BASIC METABOLIC PANEL
BUN/Creatinine Ratio: 21 (ref 12–28)
BUN: 48 mg/dL — ABNORMAL HIGH (ref 8–27)
CO2: 23 mmol/L (ref 20–29)
Calcium: 9.7 mg/dL (ref 8.7–10.3)
Chloride: 98 mmol/L (ref 96–106)
Creatinine, Ser: 2.32 mg/dL — ABNORMAL HIGH (ref 0.57–1.00)
GFR calc Af Amer: 22 mL/min/{1.73_m2} — ABNORMAL LOW (ref >59)
GFR calc non Af Amer: 19 mL/min/{1.73_m2} — ABNORMAL LOW (ref >59)
Glucose: 164 mg/dL — ABNORMAL HIGH (ref 65–99)
Potassium: 5.3 mmol/L — ABNORMAL HIGH (ref 3.5–5.2)
Sodium: 135 mmol/L (ref 134–144)

## 2019-10-28 LAB — CBC
Hematocrit: 28.7 % — ABNORMAL LOW (ref 34.0–46.6)
Hemoglobin: 9.2 g/dL — ABNORMAL LOW (ref 11.1–15.9)
MCH: 26.4 pg — ABNORMAL LOW (ref 26.6–33.0)
MCHC: 32.1 g/dL (ref 31.5–35.7)
MCV: 82 fL (ref 79–97)
Platelets: 285 10*3/uL (ref 150–450)
RBC: 3.49 x10E6/uL — ABNORMAL LOW (ref 3.77–5.28)
RDW: 14.2 % (ref 11.7–15.4)
WBC: 9 10*3/uL (ref 3.4–10.8)

## 2019-11-02 ENCOUNTER — Encounter (HOSPITAL_COMMUNITY): Payer: Self-pay

## 2019-11-04 ENCOUNTER — Telehealth: Payer: Self-pay | Admitting: *Deleted

## 2019-11-04 NOTE — Telephone Encounter (Signed)
Followed up w/ pt per conversation we had last week. She reports that she takes Lasix M/W/F but from Friday to Monday she has a build up of 3-5 pds of fluid.  She asked if she should take her Lasix QOD instead, so as to avoid this. When I called her today to follow up she states she has already changed how she takes her Lasix, she is now taking it QOD.  Informed that I would send this information to [facility] for their FYI & advisement. She also asks about her blood work results from last week.  Reviewed w/ pt and informed that I would forward this to [facility] to discuss/address abnormalcy. She understands their office will call her back w/ advisement.

## 2019-11-06 NOTE — Telephone Encounter (Signed)
Dr Haroldine Laws, please review info and  pt's labs from 1/5 and advise

## 2019-11-07 NOTE — Telephone Encounter (Signed)
Ok to increase lasix. Potassium up on labs. Increasing lasix may help.   Repeat BMET and BNP early this week.

## 2019-11-10 DIAGNOSIS — L299 Pruritus, unspecified: Secondary | ICD-10-CM | POA: Diagnosis not present

## 2019-11-10 DIAGNOSIS — R209 Unspecified disturbances of skin sensation: Secondary | ICD-10-CM | POA: Diagnosis not present

## 2019-11-10 DIAGNOSIS — Z950 Presence of cardiac pacemaker: Secondary | ICD-10-CM | POA: Diagnosis not present

## 2019-11-10 DIAGNOSIS — Z7901 Long term (current) use of anticoagulants: Secondary | ICD-10-CM | POA: Diagnosis not present

## 2019-11-10 DIAGNOSIS — M545 Low back pain: Secondary | ICD-10-CM | POA: Diagnosis not present

## 2019-11-10 DIAGNOSIS — D500 Iron deficiency anemia secondary to blood loss (chronic): Secondary | ICD-10-CM | POA: Diagnosis not present

## 2019-11-10 DIAGNOSIS — N184 Chronic kidney disease, stage 4 (severe): Secondary | ICD-10-CM | POA: Diagnosis not present

## 2019-11-10 DIAGNOSIS — I10 Essential (primary) hypertension: Secondary | ICD-10-CM | POA: Diagnosis not present

## 2019-11-10 DIAGNOSIS — T50B95D Adverse effect of other viral vaccines, subsequent encounter: Secondary | ICD-10-CM | POA: Diagnosis not present

## 2019-11-13 DIAGNOSIS — N184 Chronic kidney disease, stage 4 (severe): Secondary | ICD-10-CM | POA: Diagnosis not present

## 2019-11-13 DIAGNOSIS — I4891 Unspecified atrial fibrillation: Secondary | ICD-10-CM | POA: Diagnosis not present

## 2019-11-13 DIAGNOSIS — I5022 Chronic systolic (congestive) heart failure: Secondary | ICD-10-CM | POA: Diagnosis not present

## 2019-11-13 DIAGNOSIS — D631 Anemia in chronic kidney disease: Secondary | ICD-10-CM | POA: Diagnosis not present

## 2019-11-13 DIAGNOSIS — I129 Hypertensive chronic kidney disease with stage 1 through stage 4 chronic kidney disease, or unspecified chronic kidney disease: Secondary | ICD-10-CM | POA: Diagnosis not present

## 2019-11-13 DIAGNOSIS — E875 Hyperkalemia: Secondary | ICD-10-CM | POA: Diagnosis not present

## 2019-11-13 DIAGNOSIS — N189 Chronic kidney disease, unspecified: Secondary | ICD-10-CM | POA: Diagnosis not present

## 2019-11-25 ENCOUNTER — Other Ambulatory Visit (HOSPITAL_COMMUNITY): Payer: Self-pay | Admitting: *Deleted

## 2019-11-25 NOTE — Discharge Instructions (Signed)
  Epoetin Alfa injection What is this medicine? EPOETIN ALFA (e POE e tin AL fa) helps your body make more red blood cells. This medicine is used to treat anemia caused by chronic kidney disease, cancer chemotherapy, or HIV-therapy. It may also be used before surgery if you have anemia. This medicine may be used for other purposes; ask your health care provider or pharmacist if you have questions. COMMON BRAND NAME(S): Epogen, Procrit, Retacrit What should I tell my health care provider before I take this medicine? They need to know if you have any of these conditions:  cancer  heart disease  high blood pressure  history of blood clots  history of stroke  low levels of folate, iron, or vitamin B12 in the blood  seizures  an unusual or allergic reaction to erythropoietin, albumin, benzyl alcohol, hamster proteins, other medicines, foods, dyes, or preservatives  pregnant or trying to get pregnant  breast-feeding How should I use this medicine? This medicine is for injection into a vein or under the skin. It is usually given by a health care professional in a hospital or clinic setting. If you get this medicine at home, you will be taught how to prepare and give this medicine. Use exactly as directed. Take your medicine at regular intervals. Do not take your medicine more often than directed. It is important that you put your used needles and syringes in a special sharps container. Do not put them in a trash can. If you do not have a sharps container, call your pharmacist or healthcare provider to get one. A special MedGuide will be given to you by the pharmacist with each prescription and refill. Be sure to read this information carefully each time. Talk to your pediatrician regarding the use of this medicine in children. While this drug may be prescribed for selected conditions, precautions do apply. Overdosage: If you think you have taken too much of this medicine contact a  poison control center or emergency room at once. NOTE: This medicine is only for you. Do not share this medicine with others. What if I miss a dose? If you miss a dose, take it as soon as you can. If it is almost time for your next dose, take only that dose. Do not take double or extra doses. What may interact with this medicine? Interactions have not been studied. This list may not describe all possible interactions. Give your health care provider a list of all the medicines, herbs, non-prescription drugs, or dietary supplements you use. Also tell them if you smoke, drink alcohol, or use illegal drugs. Some items may interact with your medicine. What should I watch for while using this medicine? Your condition will be monitored carefully while you are receiving this medicine. You may need blood work done while you are taking this medicine. This medicine may cause a decrease in vitamin B6. You should make sure that you get enough vitamin B6 while you are taking this medicine. Discuss the foods you eat and the vitamins you take with your health care professional. What side effects may I notice from receiving this medicine? Side effects that you should report to your doctor or health care professional as soon as possible:  allergic reactions like skin rash, itching or hives, swelling of the face, lips, or tongue  seizures  signs and symptoms of a blood clot such as breathing problems; changes in vision; chest pain; severe, sudden headache; pain, swelling, warmth in the leg; trouble speaking; sudden   numbness or weakness of the face, arm or leg  signs and symptoms of a stroke like changes in vision; confusion; trouble speaking or understanding; severe headaches; sudden numbness or weakness of the face, arm or leg; trouble walking; dizziness; loss of balance or coordination Side effects that usually do not require medical attention (report to your doctor or health care professional if they continue  or are bothersome):  chills  cough  dizziness  fever  headaches  joint pain  muscle cramps  muscle pain  nausea, vomiting  pain, redness, or irritation at site where injected This list may not describe all possible side effects. Call your doctor for medical advice about side effects. You may report side effects to FDA at 1-800-FDA-1088. Where should I keep my medicine? Keep out of the reach of children. Store in a refrigerator between 2 and 8 degrees C (36 and 46 degrees F). Do not freeze or shake. Throw away any unused portion if using a single-dose vial. Multi-dose vials can be kept in the refrigerator for up to 21 days after the initial dose. Throw away unused medicine. NOTE: This sheet is a summary. It may not cover all possible information. If you have questions about this medicine, talk to your doctor, pharmacist, or health care provider.  2020 Elsevier/Gold Standard (2017-05-17 08:35:19) Ferumoxytol injection What is this medicine? FERUMOXYTOL is an iron complex. Iron is used to make healthy red blood cells, which carry oxygen and nutrients throughout the body. This medicine is used to treat iron deficiency anemia. This medicine may be used for other purposes; ask your health care provider or pharmacist if you have questions. COMMON BRAND NAME(S): Feraheme What should I tell my health care provider before I take this medicine? They need to know if you have any of these conditions:  anemia not caused by low iron levels  high levels of iron in the blood  magnetic resonance imaging (MRI) test scheduled  an unusual or allergic reaction to iron, other medicines, foods, dyes, or preservatives  pregnant or trying to get pregnant  breast-feeding How should I use this medicine? This medicine is for injection into a vein. It is given by a health care professional in a hospital or clinic setting. Talk to your pediatrician regarding the use of this medicine in children.  Special care may be needed. Overdosage: If you think you have taken too much of this medicine contact a poison control center or emergency room at once. NOTE: This medicine is only for you. Do not share this medicine with others. What if I miss a dose? It is important not to miss your dose. Call your doctor or health care professional if you are unable to keep an appointment. What may interact with this medicine? This medicine may interact with the following medications:  other iron products This list may not describe all possible interactions. Give your health care provider a list of all the medicines, herbs, non-prescription drugs, or dietary supplements you use. Also tell them if you smoke, drink alcohol, or use illegal drugs. Some items may interact with your medicine. What should I watch for while using this medicine? Visit your doctor or healthcare professional regularly. Tell your doctor or healthcare professional if your symptoms do not start to get better or if they get worse. You may need blood work done while you are taking this medicine. You may need to follow a special diet. Talk to your doctor. Foods that contain iron include: whole grains/cereals, dried fruits,   beans, or peas, leafy green vegetables, and organ meats (liver, kidney). What side effects may I notice from receiving this medicine? Side effects that you should report to your doctor or health care professional as soon as possible:  allergic reactions like skin rash, itching or hives, swelling of the face, lips, or tongue  breathing problems  changes in blood pressure  feeling faint or lightheaded, falls  fever or chills  flushing, sweating, or hot feelings  swelling of the ankles or feet Side effects that usually do not require medical attention (report to your doctor or health care professional if they continue or are bothersome):  diarrhea  headache  nausea, vomiting  stomach pain This list may not  describe all possible side effects. Call your doctor for medical advice about side effects. You may report side effects to FDA at 1-800-FDA-1088. Where should I keep my medicine? This drug is given in a hospital or clinic and will not be stored at home. NOTE: This sheet is a summary. It may not cover all possible information. If you have questions about this medicine, talk to your doctor, pharmacist, or health care provider.  2020 Elsevier/Gold Standard (2016-11-26 20:21:10)  

## 2019-11-26 ENCOUNTER — Ambulatory Visit (HOSPITAL_COMMUNITY)
Admission: RE | Admit: 2019-11-26 | Discharge: 2019-11-26 | Disposition: A | Discharge status: Home / Self Care | Payer: Medicare HMO | Source: Ambulatory Visit | Attending: Nephrology | Admitting: Nephrology

## 2019-11-26 ENCOUNTER — Other Ambulatory Visit: Payer: Self-pay

## 2019-11-26 VITALS — BP 137/44 | HR 70 | Temp 95.3°F | Resp 20 | Ht 65.0 in | Wt 148.0 lb

## 2019-11-26 DIAGNOSIS — D631 Anemia in chronic kidney disease: Secondary | ICD-10-CM | POA: Insufficient documentation

## 2019-11-26 DIAGNOSIS — N183 Chronic kidney disease, stage 3 unspecified: Secondary | ICD-10-CM | POA: Diagnosis present

## 2019-11-26 LAB — POCT HEMOGLOBIN-HEMACUE: Hemoglobin: 8.1 g/dL — ABNORMAL LOW (ref 12.0–15.0)

## 2019-11-26 MED ORDER — SODIUM CHLORIDE 0.9 % IV SOLN
510.0000 mg | INTRAVENOUS | Status: DC
  Administered 2019-11-26: 510 mg via INTRAVENOUS
  Filled 2019-11-26: qty 17

## 2019-11-26 MED ORDER — EPOETIN ALFA-EPBX 10000 UNIT/ML IJ SOLN: 20000.0000 [IU] | INTRAMUSCULAR | Status: DC

## 2019-11-26 MED ORDER — EPOETIN ALFA-EPBX 10000 UNIT/ML IJ SOLN
INTRAMUSCULAR | Status: AC
  Administered 2019-11-26: 20000 [IU] via SUBCUTANEOUS
  Filled 2019-11-26: qty 2

## 2019-12-02 ENCOUNTER — Other Ambulatory Visit: Payer: Self-pay | Admitting: Sports Medicine

## 2019-12-02 DIAGNOSIS — R195 Other fecal abnormalities: Secondary | ICD-10-CM | POA: Diagnosis not present

## 2019-12-02 DIAGNOSIS — M810 Age-related osteoporosis without current pathological fracture: Secondary | ICD-10-CM | POA: Diagnosis not present

## 2019-12-02 DIAGNOSIS — M545 Low back pain, unspecified: Secondary | ICD-10-CM

## 2019-12-02 DIAGNOSIS — M48061 Spinal stenosis, lumbar region without neurogenic claudication: Secondary | ICD-10-CM | POA: Diagnosis not present

## 2019-12-03 ENCOUNTER — Encounter (HOSPITAL_COMMUNITY): Payer: Medicare HMO

## 2019-12-04 ENCOUNTER — Other Ambulatory Visit: Payer: Medicare HMO

## 2019-12-04 ENCOUNTER — Other Ambulatory Visit (HOSPITAL_COMMUNITY): Payer: Self-pay | Admitting: Internal Medicine

## 2019-12-08 ENCOUNTER — Other Ambulatory Visit: Payer: Self-pay

## 2019-12-08 ENCOUNTER — Ambulatory Visit (HOSPITAL_COMMUNITY)
Admission: RE | Admit: 2019-12-08 | Discharge: 2019-12-08 | Disposition: A | Discharge status: Home / Self Care | Payer: Medicare HMO | Source: Ambulatory Visit | Attending: Nephrology | Admitting: Nephrology

## 2019-12-08 VITALS — BP 155/63 | HR 70 | Temp 95.3°F | Resp 20 | Ht 65.0 in | Wt 147.0 lb

## 2019-12-08 DIAGNOSIS — N183 Chronic kidney disease, stage 3 unspecified: Secondary | ICD-10-CM | POA: Diagnosis present

## 2019-12-08 LAB — POCT HEMOGLOBIN-HEMACUE: Hemoglobin: 10.1 g/dL — ABNORMAL LOW (ref 12.0–15.0)

## 2019-12-08 MED ORDER — SODIUM CHLORIDE 0.9 % IV SOLN
510.0000 mg | INTRAVENOUS | Status: DC
  Administered 2019-12-08: 510 mg via INTRAVENOUS
  Filled 2019-12-08: qty 17

## 2019-12-08 MED ORDER — EPOETIN ALFA-EPBX 10000 UNIT/ML IJ SOLN: 20000.0000 [IU] | INTRAMUSCULAR | Status: DC

## 2019-12-08 NOTE — Progress Notes (Signed)
Pt arrived for Retacrit and second IV Feraheme. Pt reported chills, low grade fever, body aches, fatigue and nausea that lasted 3 days following first Retacrit injection on 11/26/19; therefore, refusing further injections. IV Feraheme given. Dr. Bishop [place] office called, spoke to [employer], to inform. No new orders received. Pt verbalized understanding to call office back with new or worsening symptoms of anemia or come to the ED. Hemocue today 10.1.

## 2019-12-09 ENCOUNTER — Other Ambulatory Visit: Payer: Medicare HMO

## 2019-12-10 ENCOUNTER — Encounter (HOSPITAL_COMMUNITY): Payer: Medicare HMO

## 2019-12-10 ENCOUNTER — Ambulatory Visit (HOSPITAL_COMMUNITY): Payer: Medicare HMO | Admitting: Physician Assistant

## 2019-12-11 ENCOUNTER — Other Ambulatory Visit: Payer: Medicare HMO

## 2019-12-14 ENCOUNTER — Other Ambulatory Visit (HOSPITAL_COMMUNITY): Payer: Self-pay | Admitting: Internal Medicine

## 2019-12-16 ENCOUNTER — Encounter (HOSPITAL_COMMUNITY): Payer: Self-pay | Admitting: Physician Assistant

## 2019-12-16 ENCOUNTER — Ambulatory Visit (HOSPITAL_COMMUNITY)
Admission: RE | Admit: 2019-12-16 | Discharge: 2019-12-16 | Disposition: A | Discharge status: Home / Self Care | Payer: Medicare HMO | Source: Ambulatory Visit | Attending: Physician Assistant | Admitting: Physician Assistant

## 2019-12-16 ENCOUNTER — Other Ambulatory Visit: Payer: Self-pay

## 2019-12-16 VITALS — BP 160/80 | HR 70 | Ht 65.0 in | Wt 149.4 lb

## 2019-12-16 DIAGNOSIS — Z823 Family history of stroke: Secondary | ICD-10-CM | POA: Insufficient documentation

## 2019-12-16 DIAGNOSIS — Z880 Allergy status to penicillin: Secondary | ICD-10-CM | POA: Diagnosis not present

## 2019-12-16 DIAGNOSIS — Z950 Presence of cardiac pacemaker: Secondary | ICD-10-CM | POA: Diagnosis not present

## 2019-12-16 DIAGNOSIS — I480 Paroxysmal atrial fibrillation: Secondary | ICD-10-CM | POA: Diagnosis not present

## 2019-12-16 DIAGNOSIS — E1122 Type 2 diabetes mellitus with diabetic chronic kidney disease: Secondary | ICD-10-CM | POA: Diagnosis not present

## 2019-12-16 DIAGNOSIS — I428 Other cardiomyopathies: Secondary | ICD-10-CM | POA: Insufficient documentation

## 2019-12-16 DIAGNOSIS — Z833 Family history of diabetes mellitus: Secondary | ICD-10-CM | POA: Diagnosis not present

## 2019-12-16 DIAGNOSIS — Z888 Allergy status to other drugs, medicaments and biological substances status: Secondary | ICD-10-CM | POA: Insufficient documentation

## 2019-12-16 DIAGNOSIS — Z881 Allergy status to other antibiotic agents status: Secondary | ICD-10-CM | POA: Diagnosis not present

## 2019-12-16 DIAGNOSIS — Z87891 Personal history of nicotine dependence: Secondary | ICD-10-CM | POA: Insufficient documentation

## 2019-12-16 DIAGNOSIS — I4891 Unspecified atrial fibrillation: Secondary | ICD-10-CM | POA: Diagnosis present

## 2019-12-16 DIAGNOSIS — I130 Hypertensive heart and chronic kidney disease with heart failure and stage 1 through stage 4 chronic kidney disease, or unspecified chronic kidney disease: Secondary | ICD-10-CM | POA: Diagnosis not present

## 2019-12-16 DIAGNOSIS — N189 Chronic kidney disease, unspecified: Secondary | ICD-10-CM | POA: Insufficient documentation

## 2019-12-16 DIAGNOSIS — Z885 Allergy status to narcotic agent status: Secondary | ICD-10-CM | POA: Diagnosis not present

## 2019-12-16 DIAGNOSIS — D6869 Other thrombophilia: Secondary | ICD-10-CM | POA: Diagnosis not present

## 2019-12-16 DIAGNOSIS — Z7901 Long term (current) use of anticoagulants: Secondary | ICD-10-CM | POA: Diagnosis not present

## 2019-12-16 DIAGNOSIS — E785 Hyperlipidemia, unspecified: Secondary | ICD-10-CM | POA: Diagnosis not present

## 2019-12-16 DIAGNOSIS — Z8249 Family history of ischemic heart disease and other diseases of the circulatory system: Secondary | ICD-10-CM | POA: Diagnosis not present

## 2019-12-16 DIAGNOSIS — I5042 Chronic combined systolic (congestive) and diastolic (congestive) heart failure: Secondary | ICD-10-CM | POA: Diagnosis not present

## 2019-12-16 DIAGNOSIS — Z886 Allergy status to analgesic agent status: Secondary | ICD-10-CM | POA: Diagnosis not present

## 2019-12-16 DIAGNOSIS — K219 Gastro-esophageal reflux disease without esophagitis: Secondary | ICD-10-CM | POA: Diagnosis not present

## 2019-12-16 DIAGNOSIS — Z79899 Other long term (current) drug therapy: Secondary | ICD-10-CM | POA: Insufficient documentation

## 2019-12-16 NOTE — Progress Notes (Signed)
Primary Care Physician: Leighton Ruff, MD Primary Cardiologist: Dr Haroldine Laws Primary Electrophysiologist: Dr Curt Bears Referring Physician: Device Clinic   Krista Mason is a 84 y.o. female with a history of HTN, PVCs, HLD, DM2, CKD, histoplasmosis, tobacco abuse, chronic systolic CHF, and new onset paroxysmal atrial fibrillation who presents for follow up in the [facility].  The patient was initially diagnosed with atrial fibrillation on device interrogation after undergoing pacemaker implant with Dr Curt Bears. She was prescribed amiodarone but had not started this due to concerns about side effects. Patient was hospitalized from 08/18/19-08/25/19 with a GI bleed and anemia. Patient diagnosed with severe esophagitis. Her Eliquis was resumed 08/27/19 for a CHADS2VASC score of 6. Of note, she was in SR during her admission.  On follow up today, patient reports that she has done well. Her lower extremity edema has improved after starting Lasix every other day. She has had no heart racing or palpitations. She is in SR today. She is tolerating the mediation without difficulty. She denies any black stools or bleeding issues with anticoagulation.  Today, she denies symptoms of palpitations, chest pain, orthopnea, PND, lower extremity edema, dizziness, presyncope, syncope, snoring, daytime somnolence, bleeding, or neurologic sequela. The patient is tolerating medications without difficulties and is otherwise without complaint today.    Atrial Fibrillation Risk Factors:  she does not have symptoms or diagnosis of sleep apnea. she does not have a history of rheumatic fever.   she has a BMI of Body mass index is 24.86 kg/m.[name] Filed Weights   12/16/19 1520  Weight: 67.8 kg    Family History  Problem Relation Age of Onset  . Heart attack Father   . Heart disease Father   . Cancer Father   . Hypertension Father   . Hypertension Brother   . Stroke Brother   .  Hypertension Brother   . Diabetes Son   . Hyperlipidemia Son   . Hypertension Son      Atrial Fibrillation Management history:  Previous antiarrhythmic drugs: amiodarone Previous cardioversions: none Previous ablations: PVC ablation 2018 CHADS2VASC score: 6 Anticoagulation history: Eliquis   Past Medical History:  Diagnosis Date  . Atrial fibrillation ([place])   . Chronic combined systolic and diastolic CHF (congestive heart failure) ([place])    a. LV dysfcuntion felt to be related to PVCs  . Chronic kidney disease   . Diabetes mellitus without complication ([place])   . GERD (gastroesophageal reflux disease)   . Hx of echocardiogram    Echo (1/16):  EF 60-65%, no RWMA, Gr 1 DD, mod AI, mod MR, mild LAE  . Hyperkalemia   . Hyperlipidemia   . Hypertension   . NICM (nonischemic cardiomyopathy) ([place])    a. 12/2018 Echo: EF 35%, diff HK w/ septal-lateral dyssynchrony. Nl RV fxn; b. 07/2019 s/p MDT [id number] [name] CRT-P MRI SureScan device (ser #: [id number])  . Orthostatic hypotension   . PVC (premature ventricular contraction)    a. s/p PVC ablation in 11/2016  . Rheumatic fever    Past Surgical History:  Procedure Laterality Date  . BIOPSY  08/20/2019   Procedure: BIOPSY;  Surgeon: Otis Brace, MD;  Location: [facility];  Service: Gastroenterology;;  . BIV PACEMAKER INSERTION CRT-P N/A 07/24/2019   Procedure: BIV PACEMAKER INSERTION CRT-P;  Surgeon: Constance Haw, MD;  Location: [facility];  Service: Cardiovascular;  Laterality: N/A;  . COLONOSCOPY WITH PROPOFOL N/A 08/22/2019   Procedure: COLONOSCOPY WITH PROPOFOL;  Surgeon: Oletta Lamas,  Jeneen Rinks, MD;  Location: [facility];  Service: Endoscopy;  Laterality: N/A;  . ESOPHAGOGASTRODUODENOSCOPY (EGD) WITH PROPOFOL N/A 09/25/2018   Procedure: ESOPHAGOGASTRODUODENOSCOPY (EGD) WITH PROPOFOL;  Surgeon: Ronald Lobo, MD;  Location: [facility];  Service: Endoscopy;  Laterality: N/A;  . ESOPHAGOGASTRODUODENOSCOPY  (EGD) WITH PROPOFOL N/A 10/29/2018   Procedure: ESOPHAGOGASTRODUODENOSCOPY (EGD) WITH PROPOFOL;  Surgeon: Ronald Lobo, MD;  Location: [facility];  Service: Endoscopy;  Laterality: N/A;  . ESOPHAGOGASTRODUODENOSCOPY (EGD) WITH PROPOFOL N/A 08/20/2019   Procedure: ESOPHAGOGASTRODUODENOSCOPY (EGD) WITH PROPOFOL;  Surgeon: Otis Brace, MD;  Location: [facility];  Service: Gastroenterology;  Laterality: N/A;  . HEMOSTASIS CLIP PLACEMENT  08/22/2019   Procedure: HEMOSTASIS CLIP PLACEMENT;  Surgeon: Laurence Spates, MD;  Location: [facility] END[facility];  Service: Endoscopy;;  . KNEE SURGERY Left 2009  . OVARIAN CYST REMOVAL    . POLYPECTOMY  08/22/2019   Procedure: POLYPECTOMY;  Surgeon: Laurence Spates, MD;  Location: [facility];  Service: Endoscopy;;  . PVC ABLATION N/A 11/30/2016   Procedure: PVC Ablation;  Surgeon: Will [name], MD;  Location: [facility];  Service: Cardiovascular;  Laterality: N/A;  . RIGHT HEART CATH N/A 01/31/2018   Procedure: RIGHT HEART CATH;  Surgeon: Jolaine Artist, MD;  Location: [facility];  Service: Cardiovascular;  Laterality: N/A;  . RIGHT/LEFT HEART CATH AND CORONARY ANGIOGRAPHY N/A 01/25/2017   Procedure: Right/Left Heart Cath and Coronary Angiography;  Surgeon: Jolaine Artist, MD;  Location: [facility];  Service: Cardiovascular;  Laterality: N/A;  . SAVORY DILATION N/A 09/25/2018   Procedure: SAVORY DILATION;  Surgeon: Ronald Lobo, MD;  Location: [facility];  Service: Endoscopy;  Laterality: N/A;  . SAVORY DILATION N/A 10/29/2018   Procedure: SAVORY DILATION;  Surgeon: Ronald Lobo, MD;  Location: [facility];  Service: Endoscopy;  Laterality: N/A;  . SAVORY DILATION N/A 08/20/2019   Procedure: SAVORY DILATION;  Surgeon: Otis Brace, MD;  Location: [facility];  Service: Gastroenterology;  Laterality: N/A;    Current Outpatient Medications  Medication Sig Dispense Refill  . amiodarone (PACERONE) 200 MG tablet  Take 1 tablet (200 mg total) by mouth daily. 90 tablet 3  . apixaban (ELIQUIS) 2.5 MG TABS tablet Take 1 tablet (2.5 mg total) by mouth 2 (two) times daily.    [name] atorvastatin (LIPITOR) 20 MG tablet TAKE 1 TABLET BY MOUTH EVERY DAY 90 tablet 3  . Camphor-Eucalyptus-Menthol (VICKS VAPORUB EX) Apply 1 application topically daily as needed (toe fungus).    . carvedilol (COREG) 12.5 MG tablet Take 1 tablet (12.5 mg total) by mouth 2 (two) times daily. 180 tablet 3  . Cholecalciferol (VITAMIN D) 50 MCG (2000 UT) tablet Take 2,000 Units by mouth daily.    . diphenhydrAMINE (BENADRYL) 25 mg capsule Take 25 mg by mouth every 6 (six) hours as needed.    . feeding supplement, ENSURE ENLIVE, (ENSURE ENLIVE) LIQD Take 237 mLs by mouth 2 (two) times daily between meals. 237 mL 12  . furosemide (LASIX) 40 MG tablet Take 1 tablet (40 mg total) by mouth every other day. 90 tablet 2  . hydrALAZINE (APRESOLINE) 50 MG tablet Take 1 tablet (50 mg total) by mouth 3 (three) times daily. (Patient taking differently: Take 50 mg by mouth in the morning and at bedtime. ) 180 tablet 3  . pantoprazole (PROTONIX) 40 MG tablet Take 1 tablet (40 mg total) by mouth 2 (two) times daily before a meal. 60 tablet 1  . Polyethyl Glycol-Propyl Glycol (SYSTANE OP) Place 1 drop  into both eyes 2 (two) times daily as needed (dryness).    . vitamin B-12 (CYANOCOBALAMIN) 1000 MCG tablet Take 1 tablet (1,000 mcg total) by mouth daily. 30 tablet 0   No current facility-administered medications for this encounter.    Allergies  Allergen Reactions  . Epinephrine Other (See Comments)    "feels like heart is beating out of chest"  . Penicillins Itching and Rash    Amoxicillin ok- IM pen is what gives reaction  Did it involve swelling of the face/tongue/throat, SOB, or low BP? No Did it involve sudden or severe rash/hives, skin peeling, or any reaction on the inside of your mouth or nose? No Did you need to seek medical attention at a  hospital or doctor's office? No When did it last happen?10 + year If all above answers are "NO", may proceed with cephalosporin use.   [name] Retacrit [Epoetin (Alfa)] Other (See Comments)    Chills, low grade fever, body aches, fatigue, nausea for 3 days.  [name] [Cefaclor] Itching  . Crestor [Rosuvastatin Calcium] Nausea Only  . Influenza Vaccines     Swelling and redness at injection site, fever  . Nsaids     Pt states she is not taking because of kidney function  . Percocet [Oxycodone-Acetaminophen] Nausea And Vomiting  . Prednisone     Dizziness, spiked blood sugar    Social History   Socioeconomic History  . Marital status: Widowed    Spouse name: Not on file  . Number of children: Not on file  . Years of education: Not on file  . Highest education level: Not on file  Occupational History  . Not on file  Tobacco Use  . Smoking status: Former Smoker    Packs/day: 0.75    Years: 60.00    Pack years: 45.00    Quit date: 11/04/2016    Years since quitting: 3.1  . Smokeless tobacco: Never Used  Substance and Sexual Activity  . Alcohol use: No  . Drug use: No  . Sexual activity: Not on file  Other Topics Concern  . Not on file  Social History Narrative   Lives at [facility].   Social Determinants of Health   Financial Resource Strain:   . Difficulty of Paying Living Expenses: Not on file  Food Insecurity:   . Worried About Charity fundraiser in the Last Year: Not on file  . Ran Out of Food in the Last Year: Not on file  Transportation Needs:   . Lack of Transportation (Medical): Not on file  . Lack of Transportation (Non-Medical): Not on file  Physical Activity:   . Days of Exercise per Week: Not on file  . Minutes of Exercise per Session: Not on file  Stress:   . Feeling of Stress : Not on file  Social Connections:   . Frequency of Communication with Friends and Family: Not on file  . Frequency of Social Gatherings with Friends and  Family: Not on file  . Attends Religious Services: Not on file  . Active Member of Clubs or Organizations: Not on file  . Attends Archivist Meetings: Not on file  . Marital Status: Not on file  Intimate Partner Violence:   . Fear of Current or Ex-Partner: Not on file  . Emotionally Abused: Not on file  . Physically Abused: Not on file  . Sexually Abused: Not on file     ROS- All systems are reviewed and negative except  as per the HPI above.  Physical Exam: Vitals:   12/16/19 1520  BP: (!) 160/80  Pulse: 70  Weight: 67.8 kg  Height: 5\' 5"  (1.651 m)    GEN- The patient is well appearing elderly female, alert and oriented x 3 today.   HEENT-head normocephalic, atraumatic, sclera clear, conjunctiva pink, hearing intact, trachea midline. Lungs- Clear to ausculation bilaterally, normal work of breathing Heart- Regular rate and rhythm, no murmurs, rubs or gallops  GI- soft, NT, ND, + BS Extremities- no clubbing, cyanosis, or edema MS- no significant deformity or atrophy Skin- no rash or lesion Psych- euthymic mood, full affect Neuro- strength and sensation are intact   Wt Readings from Last 3 Encounters:  12/16/19 67.8 kg  12/08/19 66.7 kg  11/26/19 67.1 kg    EKG today demonstrates A paced rhythm HR 70, PR 168, QRS 144, QTc 505  Echo 01/01/19 demonstrated  1. The left ventricle has a visually estimated ejection fraction of 35%. The cavity size was normal. There is mildly increased left ventricular wall thickness. Left ventricular diastolic Doppler parameters are consistent with impaired relaxation.  Diffuse hypokinesis with septal-lateral dyssynchrony.  2. The right ventricle has normal systolic function. The cavity was normal. There is no increase in right ventricular wall thickness.  3. Left atrial size was mildly dilated.  4. Trivial pericardial effusion is present.  5. Mild calcification of the mitral valve leaflet. Mitral valve regurgitation is mild to  moderate by color flow Doppler. No evidence of mitral valve stenosis.  6. The aortic valve is tricuspid Mild calcification of the aortic valve. Aortic valve regurgitation is mild by color flow Doppler. no stenosis of the aortic valve.  7. The aortic root and ascending aorta are normal in size and structure.  8. Normal IVC size with PA systolic pressure 21 mmHg.  Epic records are reviewed at length today  Assessment and Plan:  1. Paroxysmal atrial fibrillation Patient appears to be maintaining SR. Continue Eliquis 2.5 mg BID (age, Cr >1.5). Continue amiodarone 200 mg daily.   This patients CHA2DS2-VASc Score and unadjusted Ischemic Stroke Rate (% per year) is equal to 9.7 % stroke rate/year from a score of 6  Above score calculated as 1 point each if present [CHF, HTN, DM, Vascular=MI/PAD/Aortic Plaque, Age if 65-74, or Female] Above score calculated as 2 points each if present [Age > 75, or Stroke/TIA/TE]   2. HTN Patient reports home BP readings ranging from 315Q-008 systolic. She does have a h/o orthostatic hypotension. Will defer to her nephrologist.   3. NICM S/p CRT-P followed by Dr Curt Bears, Dr Haroldine Laws, and the device clinic. Plans for echo noted.   Follow up with Dr Haroldine Laws as scheduled. AF clinic in 6 months.   [facility] [address] [place], [place] [address] [phone] 12/16/2019 3:55 PM

## 2019-12-17 ENCOUNTER — Ambulatory Visit
Admission: RE | Admit: 2019-12-17 | Discharge: 2019-12-17 | Disposition: A | Discharge status: Home / Self Care | Payer: Medicare HMO | Source: Ambulatory Visit | Attending: Sports Medicine | Admitting: Sports Medicine

## 2019-12-17 DIAGNOSIS — M545 Low back pain, unspecified: Secondary | ICD-10-CM

## 2019-12-22 ENCOUNTER — Encounter (HOSPITAL_COMMUNITY): Payer: Medicare HMO

## 2020-01-06 ENCOUNTER — Encounter (HOSPITAL_COMMUNITY): Payer: Self-pay | Admitting: Internal Medicine

## 2020-01-06 ENCOUNTER — Other Ambulatory Visit: Payer: Self-pay

## 2020-01-06 ENCOUNTER — Ambulatory Visit (HOSPITAL_BASED_OUTPATIENT_CLINIC_OR_DEPARTMENT_OTHER)
Admission: RE | Admit: 2020-01-06 | Discharge: 2020-01-06 | Disposition: A | Discharge status: Home / Self Care | Payer: Medicare HMO | Source: Ambulatory Visit | Attending: Internal Medicine | Admitting: Internal Medicine

## 2020-01-06 ENCOUNTER — Ambulatory Visit (HOSPITAL_COMMUNITY)
Admission: RE | Admit: 2020-01-06 | Discharge: 2020-01-06 | Disposition: A | Discharge status: Home / Self Care | Payer: Medicare HMO | Source: Ambulatory Visit | Attending: Internal Medicine | Admitting: Internal Medicine

## 2020-01-06 VITALS — BP 138/68 | HR 70 | Wt 152.4 lb

## 2020-01-06 DIAGNOSIS — I447 Left bundle-branch block, unspecified: Secondary | ICD-10-CM | POA: Insufficient documentation

## 2020-01-06 DIAGNOSIS — I5022 Chronic systolic (congestive) heart failure: Secondary | ICD-10-CM

## 2020-01-06 DIAGNOSIS — Z79899 Other long term (current) drug therapy: Secondary | ICD-10-CM | POA: Diagnosis not present

## 2020-01-06 DIAGNOSIS — Z8249 Family history of ischemic heart disease and other diseases of the circulatory system: Secondary | ICD-10-CM | POA: Insufficient documentation

## 2020-01-06 DIAGNOSIS — Z87891 Personal history of nicotine dependence: Secondary | ICD-10-CM | POA: Insufficient documentation

## 2020-01-06 DIAGNOSIS — I5042 Chronic combined systolic (congestive) and diastolic (congestive) heart failure: Secondary | ICD-10-CM | POA: Diagnosis not present

## 2020-01-06 DIAGNOSIS — N184 Chronic kidney disease, stage 4 (severe): Secondary | ICD-10-CM | POA: Diagnosis not present

## 2020-01-06 DIAGNOSIS — I428 Other cardiomyopathies: Secondary | ICD-10-CM | POA: Diagnosis not present

## 2020-01-06 DIAGNOSIS — J984 Other disorders of lung: Secondary | ICD-10-CM | POA: Diagnosis not present

## 2020-01-06 DIAGNOSIS — I080 Rheumatic disorders of both mitral and aortic valves: Secondary | ICD-10-CM | POA: Diagnosis not present

## 2020-01-06 DIAGNOSIS — K219 Gastro-esophageal reflux disease without esophagitis: Secondary | ICD-10-CM | POA: Insufficient documentation

## 2020-01-06 DIAGNOSIS — E1151 Type 2 diabetes mellitus with diabetic peripheral angiopathy without gangrene: Secondary | ICD-10-CM | POA: Diagnosis not present

## 2020-01-06 DIAGNOSIS — I480 Paroxysmal atrial fibrillation: Secondary | ICD-10-CM | POA: Insufficient documentation

## 2020-01-06 DIAGNOSIS — I25119 Atherosclerotic heart disease of native coronary artery with unspecified angina pectoris: Secondary | ICD-10-CM | POA: Diagnosis not present

## 2020-01-06 DIAGNOSIS — I130 Hypertensive heart and chronic kidney disease with heart failure and stage 1 through stage 4 chronic kidney disease, or unspecified chronic kidney disease: Secondary | ICD-10-CM | POA: Insufficient documentation

## 2020-01-06 DIAGNOSIS — D472 Monoclonal gammopathy: Secondary | ICD-10-CM | POA: Diagnosis not present

## 2020-01-06 DIAGNOSIS — E785 Hyperlipidemia, unspecified: Secondary | ICD-10-CM | POA: Diagnosis not present

## 2020-01-06 DIAGNOSIS — Z833 Family history of diabetes mellitus: Secondary | ICD-10-CM | POA: Insufficient documentation

## 2020-01-06 DIAGNOSIS — Z7901 Long term (current) use of anticoagulants: Secondary | ICD-10-CM | POA: Insufficient documentation

## 2020-01-06 DIAGNOSIS — E1122 Type 2 diabetes mellitus with diabetic chronic kidney disease: Secondary | ICD-10-CM | POA: Insufficient documentation

## 2020-01-06 DIAGNOSIS — Z8349 Family history of other endocrine, nutritional and metabolic diseases: Secondary | ICD-10-CM | POA: Diagnosis not present

## 2020-01-06 DIAGNOSIS — Z950 Presence of cardiac pacemaker: Secondary | ICD-10-CM | POA: Insufficient documentation

## 2020-01-06 NOTE — Progress Notes (Signed)
Advanced Heart Failure Clinic Note    Primary Cardiologist: Dr Vaughan Browner Nephrologist: Dr Hollie Salk  HPI: Krista Mason is a 84 y.o. female with a history of HTN, HL, CKD 3, moderate mitral regurgitation, DM, and chronic systolic heart failure .  Admitted 01/28/18 with dyspnea. ECHO had gone down to 35-40% (previously 45%). Had RHC with normal filling pressures and cardiac output. Creatinine peaked at 2.1 so lasix was held for few days after discharge. Myeloma panel - 0.3 M protein noted. PYP scan was negative for TTR. Has seen Dr. Beryle Beams and not felt to have myeloma.   Has had CPX test 02/2018 which showed - submax test with moderate to severe functional limitation due to HF, lung disease and deconditioning. However, markedly elevated Ve/VCO2 slope points to HF as being the predominant limitation and would suggest work-up for advanced therapies if patient is a candidate.   Echo 3/20 shows 35-40% moderate MR. RV ok Personally reviewed  Had GI bleed in 10/20. EGD with grade D esophagitis and stricture  Underwent MDT CRT-D on 07/24/19 subsequently found to have episodes of AF.  Amiodarone started.   Returns for f/u. Says she is feeling better after 2 iron infusions. Last hgb 10.1. Not very active due to low back pain and LE neuropathy. No CP, edema, orthopnea or PND.   Echo today EF 45-50% Personally reviewed   ICD interrogation today: Volume ok. No VT/VF/AF. 100% BiV pacing. Activity level < 1hr    Cardiac studies:   5/19 CPX FVC 1.97 (73%)    FEV1 1.24 (61%)     FEV1/FVC 63 (81%)     MVV 49 (59%)     Resting HR: 58 Peak HR: 110  (78% age predicted max HR) BP rest: 152/66 BP peak: 214/70 Peak VO2: 9.6 (57% predicted peak VO2) VE/VCO2 slope: 50 OUES: 0.87 Peak RER: 0.93 VE/MVV: 70% O2pulse: 6  (75% predicted O2pulse)   01/2018 PYP scan negative for TTR  RHC 01/2018  RA = 3 RV = 29/3 PA = 25/12 (16) PCW = 8 Fick cardiac output/index = 5.3/3.0 PVR = 1.5  WU Ao sat = 97% PA sat = 65%, 67% Assessment: 1. Normal filling pressures and outputs  LHC 01/2017  Heavily calcified but non-obstructive CAD. 50% RCA and 30% LAD.   ECHO 01/2018  EF 35%  Severe concentric LVH with diffuse hypokinesis and paradoxical   septal motion. Thickened aortic and mitral valve leaflets. Mild   aortic regurgitation and moderate mitral regurgitation. Trivial   posteriorly located pericardial effusion.   Evaluation for cardiac amyloidosis should be considered .  ECHO 2018, the LVEF is improved from   35-40% up to 40-45%. There is persistent moderate mitral   regurgitation.  Review of systems complete and found to be negative unless listed in HPI.    SH:  Social History   Socioeconomic History  . Marital status: Widowed    Spouse name: Not on file  . Number of children: Not on file  . Years of education: Not on file  . Highest education level: Not on file  Occupational History  . Not on file  Tobacco Use  . Smoking status: Former Smoker    Packs/day: 0.75    Years: 60.00    Pack years: 45.00    Quit date: 11/04/2016    Years since quitting: 3.1  . Smokeless tobacco: Never Used  Substance and Sexual Activity  . Alcohol use: No  . Drug use: No  . Sexual activity: Not  on file  Other Topics Concern  . Not on file  Social History Narrative   Lives at [facility].   Social Determinants of Health   Financial Resource Strain:   . Difficulty of Paying Living Expenses:   Food Insecurity:   . Worried About Charity fundraiser in the Last Year:   . Arboriculturist in the Last Year:   Transportation Needs:   . Film/video editor (Medical):   [name] Lack of Transportation (Non-Medical):   Physical Activity:   . Days of Exercise per Week:   . Minutes of Exercise per Session:   Stress:   . Feeling of Stress :   Social Connections:   . Frequency of Communication with Friends and Family:   . Frequency of Social Gatherings with  Friends and Family:   . Attends Religious Services:   . Active Member of Clubs or Organizations:   . Attends Archivist Meetings:   [name] Marital Status:   Intimate Partner Violence:   . Fear of Current or Ex-Partner:   . Emotionally Abused:   [name] Physically Abused:   . Sexually Abused:     FH:  Family History  Problem Relation Age of Onset  . Heart attack Father   . Heart disease Father   . Cancer Father   . Hypertension Father   . Hypertension Brother   . Stroke Brother   . Hypertension Brother   . Diabetes Son   . Hyperlipidemia Son   . Hypertension Son     Past Medical History:  Diagnosis Date  . Atrial fibrillation ([place])   . Chronic combined systolic and diastolic CHF (congestive heart failure) ([place])    a. LV dysfcuntion felt to be related to PVCs  . Chronic kidney disease   . Diabetes mellitus without complication ([place])   . GERD (gastroesophageal reflux disease)   . Hx of echocardiogram    Echo (1/16):  EF 60-65%, no RWMA, Gr 1 DD, mod AI, mod MR, mild LAE  . Hyperkalemia   . Hyperlipidemia   . Hypertension   . NICM (nonischemic cardiomyopathy) ([place])    a. 12/2018 Echo: EF 35%, diff HK w/ septal-lateral dyssynchrony. Nl RV fxn; b. 07/2019 s/p MDT [id number] [name] CRT-P MRI SureScan device (ser #: [id number])  . Orthostatic hypotension   . PVC (premature ventricular contraction)    a. s/p PVC ablation in 11/2016  . Rheumatic fever     Current Outpatient Medications  Medication Sig Dispense Refill  . amiodarone (PACERONE) 200 MG tablet Take 1 tablet (200 mg total) by mouth daily. 90 tablet 3  . apixaban (ELIQUIS) 2.5 MG TABS tablet Take 1 tablet (2.5 mg total) by mouth 2 (two) times daily.    [name] atorvastatin (LIPITOR) 20 MG tablet TAKE 1 TABLET BY MOUTH EVERY DAY 90 tablet 3  . Camphor-Eucalyptus-Menthol (VICKS VAPORUB EX) Apply 1 application topically daily as needed (toe fungus).    . carvedilol (COREG) 12.5 MG tablet Take 1 tablet (12.5 mg total) by  mouth 2 (two) times daily. 180 tablet 3  . Cholecalciferol (VITAMIN D) 50 MCG (2000 UT) tablet Take 2,000 Units by mouth daily.    . diphenhydrAMINE (BENADRYL) 25 mg capsule Take 25 mg by mouth every 6 (six) hours as needed.    . feeding supplement, ENSURE ENLIVE, (ENSURE ENLIVE) LIQD Take 237 mLs by mouth 2 (two) times daily between meals. 237 mL 12  . furosemide (LASIX) 40 MG tablet Take  1 tablet (40 mg total) by mouth every other day. 90 tablet 2  . hydrALAZINE (APRESOLINE) 50 MG tablet Take 50 mg by mouth 2 (two) times daily.    . pantoprazole (PROTONIX) 40 MG tablet Take 1 tablet (40 mg total) by mouth 2 (two) times daily before a meal. 60 tablet 1  . Polyethyl Glycol-Propyl Glycol (SYSTANE OP) Place 1 drop into both eyes 2 (two) times daily as needed (dryness).    . vitamin B-12 (CYANOCOBALAMIN) 1000 MCG tablet Take 1 tablet (1,000 mcg total) by mouth daily. 30 tablet 0   No current facility-administered medications for this encounter.    Vitals:   01/06/20 1517  BP: 138/68  Pulse: 70  SpO2: 97%  Weight: 69.1 kg (152 lb 6.4 oz)   Filed Weights   01/06/20 1517  Weight: 69.1 kg (152 lb 6.4 oz)   Wt Readings from Last 3 Encounters:  01/06/20 69.1 kg (152 lb 6.4 oz)  12/16/19 67.8 kg (149 lb 6.4 oz)  12/08/19 66.7 kg (147 lb)   PHYSICAL EXAM: General:  Well appearing. No resp difficulty HEENT: normal Neck: supple. no JVD. Carotids 2+ bilat; no bruits. No lymphadenopathy or thryomegaly appreciated. Cor: PMI nondisplaced. Regular rate & rhythm. No rubs, gallops or murmurs. Lungs: clear Abdomen: soft, nontender, nondistended. No hepatosplenomegaly. No bruits or masses. Good bowel sounds. Extremities: no cyanosis, clubbing, rash, edema Neuro: alert & orientedx3, cranial nerves grossly intact. moves all 4 extremities w/o difficulty. Affect pleasant   ASSESSMENT & PLAN:  1.Chronic combined CHF: NICM s/p PVC ablation. Now with LBBB. Echo8/18EF 40% (unchanged from prior). -  volume status low and cardiac output normal on RHC in 4/19 - Echo 01/2018 LVEF 35-40%, Moderate MR, LVH. Concern for amyloid.  -PYP scan negative for TTR amyloid. Has MGUS and has seen Dr. Beryle Beams but low suspicion for AL amyloid/myeloma. 24 hour urine with no Mspike - Echo 3/20 EF stable at 35-40% with moderate MR - CPX 5/19 poor efforts RER 0.9 but suggests severe multifactorial limitation but HF predominates. Unfortunately, not candidate for advanced therapies due to age and comorbidities. - s/p CRT-D on 07/24/19 - much improved with CRT. NYHA II-III limited by back pain - volume status ok  - Echo today EF 45-50% Personally reviewed - ICD interrogation today: Volume ok. No VT/VF/AF. 100% BiV pacing. Activity level < 1hr  - Encouraged her to be more active - continue carvedilol 12.5 bid - continue hydralazine 50 bid - No ACE/ARB/ARNI/spiro/SGLT2i with CKD IV  2. Chest pain - Last cath 01/2017 with heavily calcified but non-obstructive CAD. 50% RCA and 30% LAD. - No s/s ischemia  -CT chest stable with old granulomatous disease. - Continue ASA and statin  3. PVCs - s/p ablation. Off Amiodarone therapy. PVC burden low  4. LBBB - EF 35-40% on echo 3/20 + moderate MR. LBBB ~173ms. - s/p CRT 07/24/19  - EF 45-50% on echo today. D/w her. Personally reviewed   5. Drop attacks/syncope - No recent occurrences. 30 day monitor unremarkable 10/2017.  6.CKD IV - Baseline creatinine 1.7-1.9. Last value in 1/21 creatinine 2.3 - Following with Dr Hollie Salk  7. HTN - Improved  8. PAD - ABIs showed moderate disease BLE. Has seen [facility]. Medical therapy for now. No change.   9. PAF - quiescent on amio. Continue Eliquis  - hgb improved  Glori Bickers, MD  3:45 PM

## 2020-01-06 NOTE — Progress Notes (Signed)
Echocardiogram 2D Echocardiogram has been performed.  Oneal Deputy Fanta Wimberley 01/06/2020, 2:38 PM

## 2020-01-06 NOTE — Progress Notes (Signed)
Medication Samples have been provided to the patient.  Drug name: Eliquis       Strength: 2.5        Qty: 28  LOT: [id number]  Exp.Date: 12/21  Dosing instructions: 1 tab twice a day  The patient has been instructed regarding the correct time, dose, and frequency of taking this medication, including desired effects and most common side effects.   Valeda Malm 4:03 PM 01/06/2020

## 2020-01-06 NOTE — Patient Instructions (Signed)
No medication changes.   Your physician recommends that you schedule a follow-up appointment in: 9 months with Dr Haroldine Laws. We will call you to schedule this appointment.    Please call office at [phone] option [phone] if you have any questions or concerns.    At the [facility], you and your health needs are our priority. As part of our continuing mission to provide you with exceptional heart care, we have created designated Provider Care Teams. These Care Teams include your primary Cardiologist (physician) and Advanced Practice Providers (APPs- Physician Assistants and Nurse Practitioners) who all work together to provide you with the care you need, when you need it.   You may see any of the following providers on your designated Care Team at your next follow up: [name] Dr Glori Bickers . Dr Loralie Champagne . Darrick Grinder, NP . Lyda Jester, PA . Audry Riles, PharmD   Please be sure to bring in all your medications bottles to every appointment.

## 2020-01-08 ENCOUNTER — Telehealth (HOSPITAL_COMMUNITY): Payer: Self-pay | Admitting: *Deleted

## 2020-01-08 DIAGNOSIS — I442 Atrioventricular block, complete: Secondary | ICD-10-CM

## 2020-01-08 MED ORDER — CARVEDILOL 12.5 MG PO TABS: 12.5000 mg | ORAL_TABLET | Freq: Two times a day (BID) | ORAL | 3 refills | Status: DC

## 2020-01-08 NOTE — Telephone Encounter (Signed)
Pt called to inquire about Carvedilol dose. She states she has been on 12.5 mg BID for months, however when she picked up her refill from the pharmacy yesterday it was 6.25 mg take 1& 1/2 tabs BID.  She saw Dr Haroldine Laws on Wed and didn't know if he had changed dose.  Advised per chart pt has been on 12.5 mg BID since Oct and Dr Bensimhon's note from [place] says to continue 12.5 mg BID, advised the last prescription we sent to the pharmacy was back in Oct and was for 12.5 mg BID.  Advised pharmacy may have just filled an old rx by mistake.  Advised pt to take 2 of those tabs BID to use them up, new rx for 12.5 mg BID sent to pharmacy, advised pt when she needs more tabs to call pharmacy and not use the automated system, she verbalized understanding.

## 2020-01-13 DIAGNOSIS — N184 Chronic kidney disease, stage 4 (severe): Secondary | ICD-10-CM | POA: Diagnosis not present

## 2020-01-13 DIAGNOSIS — I129 Hypertensive chronic kidney disease with stage 1 through stage 4 chronic kidney disease, or unspecified chronic kidney disease: Secondary | ICD-10-CM | POA: Diagnosis not present

## 2020-01-13 DIAGNOSIS — N2581 Secondary hyperparathyroidism of renal origin: Secondary | ICD-10-CM | POA: Diagnosis not present

## 2020-01-13 DIAGNOSIS — N189 Chronic kidney disease, unspecified: Secondary | ICD-10-CM | POA: Diagnosis not present

## 2020-01-13 DIAGNOSIS — D631 Anemia in chronic kidney disease: Secondary | ICD-10-CM | POA: Diagnosis not present

## 2020-01-26 ENCOUNTER — Ambulatory Visit (HOSPITAL_COMMUNITY)
Admission: RE | Admit: 2020-01-26 | Discharge: 2020-01-26 | Disposition: A | Discharge status: Home / Self Care | Payer: Medicare HMO | Source: Ambulatory Visit | Attending: Nephrology | Admitting: Nephrology

## 2020-01-26 ENCOUNTER — Other Ambulatory Visit: Payer: Self-pay

## 2020-01-26 VITALS — BP 143/53 | HR 70 | Temp 96.3°F | Resp 20

## 2020-01-26 DIAGNOSIS — N183 Chronic kidney disease, stage 3 unspecified: Secondary | ICD-10-CM

## 2020-01-26 DIAGNOSIS — D631 Anemia in chronic kidney disease: Secondary | ICD-10-CM | POA: Diagnosis not present

## 2020-01-26 DIAGNOSIS — N184 Chronic kidney disease, stage 4 (severe): Secondary | ICD-10-CM | POA: Diagnosis present

## 2020-01-26 LAB — IRON AND TIBC
Iron: 66 ug/dL (ref 28–170)
Saturation Ratios: 19 % (ref 10.4–31.8)
TIBC: 342 ug/dL (ref 250–450)
UIBC: 276 ug/dL

## 2020-01-26 LAB — POCT HEMOGLOBIN-HEMACUE: Hemoglobin: 11.2 g/dL — ABNORMAL LOW (ref 12.0–15.0)

## 2020-01-26 LAB — FERRITIN: Ferritin: 55 ng/mL (ref 11–307)

## 2020-01-26 MED ORDER — DARBEPOETIN ALFA 100 MCG/0.5ML IJ SOSY
PREFILLED_SYRINGE | INTRAMUSCULAR | Status: AC
  Filled 2020-01-26: qty 0.5

## 2020-01-26 MED ORDER — DARBEPOETIN ALFA 100 MCG/0.5ML IJ SOSY
100.0000 ug | PREFILLED_SYRINGE | INTRAMUSCULAR | Status: DC
  Administered 2020-01-26: 100 ug via SUBCUTANEOUS

## 2020-01-26 NOTE — Discharge Instructions (Signed)
Darbepoetin Alfa injection What is this medicine? DARBEPOETIN ALFA (dar be POE e tin AL fa) helps your body make more red blood cells. It is used to treat anemia caused by chronic kidney failure and chemotherapy. This medicine may be used for other purposes; ask your health care provider or pharmacist if you have questions. COMMON BRAND NAME(S): Aranesp What should I tell my health care provider before I take this medicine? They need to know if you have any of these conditions:  blood clotting disorders or history of blood clots  cancer patient not on chemotherapy  cystic fibrosis  heart disease, such as angina, heart failure, or a history of a heart attack  hemoglobin level of 12 g/dL or greater  high blood pressure  low levels of folate, iron, or vitamin B12  seizures  an unusual or allergic reaction to darbepoetin, erythropoietin, albumin, hamster proteins, latex, other medicines, foods, dyes, or preservatives  pregnant or trying to get pregnant  breast-feeding How should I use this medicine? This medicine is for injection into a vein or under the skin. It is usually given by a health care professional in a hospital or clinic setting. If you get this medicine at home, you will be taught how to prepare and give this medicine. Use exactly as directed. Take your medicine at regular intervals. Do not take your medicine more often than directed. It is important that you put your used needles and syringes in a special sharps container. Do not put them in a trash can. If you do not have a sharps container, call your pharmacist or healthcare provider to get one. A special MedGuide will be given to you by the pharmacist with each prescription and refill. Be sure to read this information carefully each time. Talk to your pediatrician regarding the use of this medicine in children. While this medicine may be used in children as young as 1 month of age for selected conditions, precautions do  apply. Overdosage: If you think you have taken too much of this medicine contact a poison control center or emergency room at once. NOTE: This medicine is only for you. Do not share this medicine with others. What if I miss a dose? If you miss a dose, take it as soon as you can. If it is almost time for your next dose, take only that dose. Do not take double or extra doses. What may interact with this medicine? Do not take this medicine with any of the following medications:  epoetin alfa This list may not describe all possible interactions. Give your health care provider a list of all the medicines, herbs, non-prescription drugs, or dietary supplements you use. Also tell them if you smoke, drink alcohol, or use illegal drugs. Some items may interact with your medicine. What should I watch for while using this medicine? Your condition will be monitored carefully while you are receiving this medicine. You may need blood work done while you are taking this medicine. This medicine may cause a decrease in vitamin B6. You should make sure that you get enough vitamin B6 while you are taking this medicine. Discuss the foods you eat and the vitamins you take with your health care professional. What side effects may I notice from receiving this medicine? Side effects that you should report to your doctor or health care professional as soon as possible:  allergic reactions like skin rash, itching or hives, swelling of the face, lips, or tongue  breathing problems  changes in   vision  chest pain  confusion, trouble speaking or understanding  feeling faint or lightheaded, falls  high blood pressure  muscle aches or pains  pain, swelling, warmth in the leg  rapid weight gain  severe headaches  sudden numbness or weakness of the face, arm or leg  trouble walking, dizziness, loss of balance or coordination  seizures (convulsions)  swelling of the ankles, feet, hands  unusually weak or  tired Side effects that usually do not require medical attention (report to your doctor or health care professional if they continue or are bothersome):  diarrhea  fever, chills (flu-like symptoms)  headaches  nausea, vomiting  redness, stinging, or swelling at site where injected This list may not describe all possible side effects. Call your doctor for medical advice about side effects. You may report side effects to FDA at 1-800-FDA-1088. Where should I keep my medicine? Keep out of the reach of children. Store in a refrigerator between 2 and 8 degrees C (36 and 46 degrees F). Do not freeze. Do not shake. Throw away any unused portion if using a single-dose vial. Throw away any unused medicine after the expiration date. NOTE: This sheet is a summary. It may not cover all possible information. If you have questions about this medicine, talk to your doctor, pharmacist, or health care provider.  2020 Elsevier/Gold Standard (2017-10-23 16:44:20)  

## 2020-02-02 ENCOUNTER — Ambulatory Visit (INDEPENDENT_AMBULATORY_CARE_PROVIDER_SITE_OTHER): Payer: Medicare HMO | Admitting: *Deleted

## 2020-02-02 DIAGNOSIS — I428 Other cardiomyopathies: Secondary | ICD-10-CM | POA: Diagnosis not present

## 2020-02-02 LAB — CUP PACEART REMOTE DEVICE CHECK
Battery Remaining Longevity: 115 mo
Battery Voltage: 3.03 V
Brady Statistic AP VP Percent: 98.44 %
Brady Statistic AP VS Percent: 0.05 %
Brady Statistic AS VP Percent: 0.88 %
Brady Statistic AS VS Percent: 0.64 %
Brady Statistic RA Percent Paced: 98.82 %
Brady Statistic RV Percent Paced: 99.31 %
Date Time Interrogation Session: 20210413033835
Implantable Lead Implant Date: 20201002
Implantable Lead Implant Date: 20201002
Implantable Lead Implant Date: 20201002
Implantable Lead Location: 753858
Implantable Lead Location: 753859
Implantable Lead Location: 753860
Implantable Lead Model: 4598
Implantable Lead Model: 5076
Implantable Lead Model: 5076
Implantable Pulse Generator Implant Date: 20201002
Lead Channel Impedance Value: 304 Ohm
Lead Channel Impedance Value: 304 Ohm
Lead Channel Impedance Value: 342 Ohm
Lead Channel Impedance Value: 342 Ohm
Lead Channel Impedance Value: 380 Ohm
Lead Channel Impedance Value: 380 Ohm
Lead Channel Impedance Value: 494 Ohm
Lead Channel Impedance Value: 494 Ohm
Lead Channel Impedance Value: 532 Ohm
Lead Channel Impedance Value: 551 Ohm
Lead Channel Impedance Value: 551 Ohm
Lead Channel Impedance Value: 589 Ohm
Lead Channel Impedance Value: 589 Ohm
Lead Channel Impedance Value: 589 Ohm
Lead Channel Pacing Threshold Amplitude: 0.75 V
Lead Channel Pacing Threshold Amplitude: 0.875 V
Lead Channel Pacing Threshold Amplitude: 1.125 V
Lead Channel Pacing Threshold Pulse Width: 0.4 ms
Lead Channel Pacing Threshold Pulse Width: 0.4 ms
Lead Channel Pacing Threshold Pulse Width: 0.4 ms
Lead Channel Sensing Intrinsic Amplitude: 1.875 mV
Lead Channel Sensing Intrinsic Amplitude: 1.875 mV
Lead Channel Sensing Intrinsic Amplitude: 31.625 mV
Lead Channel Sensing Intrinsic Amplitude: 31.625 mV
Lead Channel Setting Pacing Amplitude: 1.75 V
Lead Channel Setting Pacing Amplitude: 2 V
Lead Channel Setting Pacing Amplitude: 2 V
Lead Channel Setting Pacing Pulse Width: 0.4 ms
Lead Channel Setting Pacing Pulse Width: 0.4 ms
Lead Channel Setting Sensing Sensitivity: 1.2 mV

## 2020-02-03 NOTE — Progress Notes (Signed)
PPM Remote  

## 2020-02-04 ENCOUNTER — Telehealth: Payer: Self-pay | Admitting: Cardiology

## 2020-02-04 ENCOUNTER — Other Ambulatory Visit: Payer: Self-pay

## 2020-02-04 NOTE — Telephone Encounter (Signed)
  I don't see anything on our end either. Looks like her device check was normal, it may have just been letting her know she had new results available.    I attempted to call but no answer.     Nothing to add at this time.     Thank you!

## 2020-02-04 NOTE — Telephone Encounter (Signed)
Patient states she got a message about results and to check to her mychart. She does not have mychart anymore and I did not see any notes about anyone calling her. She believes it may have been in regards to her pacemaker.

## 2020-02-23 ENCOUNTER — Ambulatory Visit (HOSPITAL_COMMUNITY)
Admission: RE | Admit: 2020-02-23 | Discharge: 2020-02-23 | Disposition: A | Discharge status: Home / Self Care | Payer: Medicare HMO | Source: Ambulatory Visit | Attending: Nephrology | Admitting: Nephrology

## 2020-02-23 ENCOUNTER — Other Ambulatory Visit: Payer: Self-pay

## 2020-02-23 VITALS — BP 130/54 | HR 69 | Temp 97.2°F | Resp 20

## 2020-02-23 DIAGNOSIS — D631 Anemia in chronic kidney disease: Secondary | ICD-10-CM | POA: Insufficient documentation

## 2020-02-23 DIAGNOSIS — N184 Chronic kidney disease, stage 4 (severe): Secondary | ICD-10-CM | POA: Insufficient documentation

## 2020-02-23 DIAGNOSIS — N183 Chronic kidney disease, stage 3 unspecified: Secondary | ICD-10-CM

## 2020-02-23 LAB — POCT HEMOGLOBIN-HEMACUE: Hemoglobin: 10.6 g/dL — ABNORMAL LOW (ref 12.0–15.0)

## 2020-02-23 LAB — IRON AND TIBC
Iron: 36 ug/dL (ref 28–170)
Saturation Ratios: 8 % — ABNORMAL LOW (ref 10.4–31.8)
TIBC: 441 ug/dL (ref 250–450)
UIBC: 405 ug/dL

## 2020-02-23 LAB — FERRITIN: Ferritin: 19 ng/mL (ref 11–307)

## 2020-02-23 MED ORDER — DARBEPOETIN ALFA 100 MCG/0.5ML IJ SOSY
PREFILLED_SYRINGE | INTRAMUSCULAR | Status: AC
  Administered 2020-02-23: 100 ug via SUBCUTANEOUS
  Filled 2020-02-23: qty 0.5

## 2020-02-23 MED ORDER — DARBEPOETIN ALFA 100 MCG/0.5ML IJ SOSY: 100.0000 ug | PREFILLED_SYRINGE | INTRAMUSCULAR | Status: DC

## 2020-03-09 ENCOUNTER — Other Ambulatory Visit: Payer: Self-pay | Admitting: Cardiology

## 2020-03-09 ENCOUNTER — Other Ambulatory Visit (HOSPITAL_COMMUNITY): Payer: Self-pay | Admitting: Internal Medicine

## 2020-03-18 ENCOUNTER — Other Ambulatory Visit (HOSPITAL_COMMUNITY): Payer: Self-pay

## 2020-03-18 DIAGNOSIS — N2581 Secondary hyperparathyroidism of renal origin: Secondary | ICD-10-CM | POA: Diagnosis not present

## 2020-03-18 DIAGNOSIS — D509 Iron deficiency anemia, unspecified: Secondary | ICD-10-CM | POA: Diagnosis not present

## 2020-03-18 DIAGNOSIS — N184 Chronic kidney disease, stage 4 (severe): Secondary | ICD-10-CM | POA: Diagnosis not present

## 2020-03-18 DIAGNOSIS — I129 Hypertensive chronic kidney disease with stage 1 through stage 4 chronic kidney disease, or unspecified chronic kidney disease: Secondary | ICD-10-CM | POA: Diagnosis not present

## 2020-03-18 DIAGNOSIS — I4891 Unspecified atrial fibrillation: Secondary | ICD-10-CM | POA: Diagnosis not present

## 2020-03-18 DIAGNOSIS — I5022 Chronic systolic (congestive) heart failure: Secondary | ICD-10-CM | POA: Diagnosis not present

## 2020-03-22 ENCOUNTER — Ambulatory Visit (HOSPITAL_COMMUNITY)
Admission: RE | Admit: 2020-03-22 | Discharge: 2020-03-22 | Disposition: A | Discharge status: Home / Self Care | Payer: Medicare HMO | Source: Ambulatory Visit | Attending: Nephrology | Admitting: Nephrology

## 2020-03-22 ENCOUNTER — Other Ambulatory Visit: Payer: Self-pay

## 2020-03-22 VITALS — BP 138/43 | HR 69 | Temp 95.5°F | Resp 20

## 2020-03-22 DIAGNOSIS — N183 Chronic kidney disease, stage 3 unspecified: Secondary | ICD-10-CM | POA: Diagnosis not present

## 2020-03-22 LAB — POCT HEMOGLOBIN-HEMACUE: Hemoglobin: 9.1 g/dL — ABNORMAL LOW (ref 12.0–15.0)

## 2020-03-22 MED ORDER — DARBEPOETIN ALFA 100 MCG/0.5ML IJ SOSY
100.0000 ug | PREFILLED_SYRINGE | INTRAMUSCULAR | Status: DC
  Administered 2020-03-22: 100 ug via SUBCUTANEOUS

## 2020-03-22 MED ORDER — DARBEPOETIN ALFA 100 MCG/0.5ML IJ SOSY
PREFILLED_SYRINGE | INTRAMUSCULAR | Status: AC
  Filled 2020-03-22: qty 0.5

## 2020-03-22 MED ORDER — SODIUM CHLORIDE 0.9 % IV SOLN
510.0000 mg | INTRAVENOUS | Status: DC
  Administered 2020-03-22: 510 mg via INTRAVENOUS
  Filled 2020-03-22: qty 17

## 2020-03-29 ENCOUNTER — Other Ambulatory Visit: Payer: Self-pay

## 2020-03-29 ENCOUNTER — Ambulatory Visit (HOSPITAL_COMMUNITY)
Admission: RE | Admit: 2020-03-29 | Discharge: 2020-03-29 | Disposition: A | Discharge status: Home / Self Care | Payer: Medicare HMO | Source: Ambulatory Visit | Attending: Nephrology | Admitting: Nephrology

## 2020-03-29 DIAGNOSIS — D631 Anemia in chronic kidney disease: Secondary | ICD-10-CM | POA: Insufficient documentation

## 2020-03-29 DIAGNOSIS — N183 Chronic kidney disease, stage 3 unspecified: Secondary | ICD-10-CM | POA: Insufficient documentation

## 2020-03-29 MED ORDER — SODIUM CHLORIDE 0.9 % IV SOLN
510.0000 mg | INTRAVENOUS | Status: AC
  Administered 2020-03-29: 510 mg via INTRAVENOUS
  Filled 2020-03-29: qty 17

## 2020-04-05 ENCOUNTER — Encounter (HOSPITAL_COMMUNITY): Payer: Medicare HMO

## 2020-04-19 ENCOUNTER — Encounter (HOSPITAL_COMMUNITY)
Admission: RE | Admit: 2020-04-19 | Discharge: 2020-04-19 | Disposition: A | Discharge status: Home / Self Care | Payer: Medicare HMO | Source: Ambulatory Visit | Attending: Nephrology | Admitting: Nephrology

## 2020-04-19 ENCOUNTER — Other Ambulatory Visit: Payer: Self-pay

## 2020-04-19 VITALS — BP 136/66 | HR 70

## 2020-04-19 DIAGNOSIS — N183 Chronic kidney disease, stage 3 unspecified: Secondary | ICD-10-CM | POA: Diagnosis not present

## 2020-04-19 DIAGNOSIS — D631 Anemia in chronic kidney disease: Secondary | ICD-10-CM | POA: Insufficient documentation

## 2020-04-19 LAB — IRON AND TIBC
Iron: 82 ug/dL (ref 28–170)
Saturation Ratios: 24 % (ref 10.4–31.8)
TIBC: 346 ug/dL (ref 250–450)
UIBC: 264 ug/dL

## 2020-04-19 LAB — FERRITIN: Ferritin: 245 ng/mL (ref 11–307)

## 2020-04-19 MED ORDER — DARBEPOETIN ALFA 100 MCG/0.5ML IJ SOSY: 100.0000 ug | PREFILLED_SYRINGE | INTRAMUSCULAR | Status: DC

## 2020-04-20 LAB — POCT HEMOGLOBIN-HEMACUE: Hemoglobin: 13.6 g/dL (ref 12.0–15.0)

## 2020-04-27 ENCOUNTER — Other Ambulatory Visit: Payer: Self-pay | Admitting: Gastroenterology

## 2020-04-27 DIAGNOSIS — D500 Iron deficiency anemia secondary to blood loss (chronic): Secondary | ICD-10-CM | POA: Diagnosis not present

## 2020-04-27 DIAGNOSIS — R1310 Dysphagia, unspecified: Secondary | ICD-10-CM | POA: Diagnosis not present

## 2020-04-27 DIAGNOSIS — R195 Other fecal abnormalities: Secondary | ICD-10-CM | POA: Diagnosis not present

## 2020-04-27 DIAGNOSIS — K2210 Ulcer of esophagus without bleeding: Secondary | ICD-10-CM | POA: Diagnosis not present

## 2020-04-29 ENCOUNTER — Telehealth (HOSPITAL_COMMUNITY): Payer: Self-pay | Admitting: *Deleted

## 2020-04-29 ENCOUNTER — Other Ambulatory Visit (HOSPITAL_COMMUNITY)
Admission: RE | Admit: 2020-04-29 | Discharge: 2020-04-29 | Disposition: A | Discharge status: Home / Self Care | Payer: Medicare HMO | Source: Ambulatory Visit | Attending: Gastroenterology | Admitting: Gastroenterology

## 2020-04-29 DIAGNOSIS — Z01812 Encounter for preprocedural laboratory examination: Secondary | ICD-10-CM | POA: Insufficient documentation

## 2020-04-29 DIAGNOSIS — Z20822 Contact with and (suspected) exposure to covid-19: Secondary | ICD-10-CM | POA: Insufficient documentation

## 2020-04-29 LAB — SARS CORONAVIRUS 2 (TAT 6-24 HRS): SARS Coronavirus 2: NEGATIVE

## 2020-04-29 NOTE — Telephone Encounter (Signed)
Cardiac clearance. Faxed to [facility]. Fax confirmation received.   Pt cleared and ok to hold eliquis two days prior to endoscopy per Dr.Bensimhon.

## 2020-05-03 ENCOUNTER — Ambulatory Visit (HOSPITAL_COMMUNITY): Payer: Medicare HMO | Admitting: Anesthesiology

## 2020-05-03 ENCOUNTER — Encounter (HOSPITAL_COMMUNITY)
Admission: RE | Disposition: A | Discharge status: Home / Self Care | Payer: Self-pay | Source: Home / Self Care | Attending: Gastroenterology

## 2020-05-03 ENCOUNTER — Ambulatory Visit (HOSPITAL_COMMUNITY)
Admission: RE | Admit: 2020-05-03 | Discharge: 2020-05-03 | Disposition: A | Discharge status: Home / Self Care | Payer: Medicare HMO | Attending: Gastroenterology | Admitting: Gastroenterology

## 2020-05-03 ENCOUNTER — Encounter (HOSPITAL_COMMUNITY): Payer: Self-pay | Admitting: Gastroenterology

## 2020-05-03 ENCOUNTER — Ambulatory Visit (INDEPENDENT_AMBULATORY_CARE_PROVIDER_SITE_OTHER): Payer: Medicare HMO | Admitting: *Deleted

## 2020-05-03 ENCOUNTER — Other Ambulatory Visit: Payer: Self-pay

## 2020-05-03 ENCOUNTER — Encounter (HOSPITAL_COMMUNITY): Payer: Medicare HMO

## 2020-05-03 DIAGNOSIS — J449 Chronic obstructive pulmonary disease, unspecified: Secondary | ICD-10-CM | POA: Insufficient documentation

## 2020-05-03 DIAGNOSIS — R1314 Dysphagia, pharyngoesophageal phase: Secondary | ICD-10-CM | POA: Diagnosis not present

## 2020-05-03 DIAGNOSIS — Z7901 Long term (current) use of anticoagulants: Secondary | ICD-10-CM | POA: Diagnosis not present

## 2020-05-03 DIAGNOSIS — Z87891 Personal history of nicotine dependence: Secondary | ICD-10-CM | POA: Diagnosis not present

## 2020-05-03 DIAGNOSIS — Z886 Allergy status to analgesic agent status: Secondary | ICD-10-CM | POA: Diagnosis not present

## 2020-05-03 DIAGNOSIS — I428 Other cardiomyopathies: Secondary | ICD-10-CM | POA: Diagnosis not present

## 2020-05-03 DIAGNOSIS — K2210 Ulcer of esophagus without bleeding: Secondary | ICD-10-CM | POA: Diagnosis not present

## 2020-05-03 DIAGNOSIS — Z950 Presence of cardiac pacemaker: Secondary | ICD-10-CM | POA: Diagnosis not present

## 2020-05-03 DIAGNOSIS — Z8249 Family history of ischemic heart disease and other diseases of the circulatory system: Secondary | ICD-10-CM | POA: Diagnosis not present

## 2020-05-03 DIAGNOSIS — Z833 Family history of diabetes mellitus: Secondary | ICD-10-CM | POA: Insufficient documentation

## 2020-05-03 DIAGNOSIS — Z880 Allergy status to penicillin: Secondary | ICD-10-CM | POA: Insufficient documentation

## 2020-05-03 DIAGNOSIS — Z885 Allergy status to narcotic agent status: Secondary | ICD-10-CM | POA: Diagnosis not present

## 2020-05-03 DIAGNOSIS — Z881 Allergy status to other antibiotic agents status: Secondary | ICD-10-CM | POA: Insufficient documentation

## 2020-05-03 DIAGNOSIS — K219 Gastro-esophageal reflux disease without esophagitis: Secondary | ICD-10-CM | POA: Insufficient documentation

## 2020-05-03 DIAGNOSIS — I5042 Chronic combined systolic (congestive) and diastolic (congestive) heart failure: Secondary | ICD-10-CM | POA: Diagnosis not present

## 2020-05-03 DIAGNOSIS — N189 Chronic kidney disease, unspecified: Secondary | ICD-10-CM | POA: Insufficient documentation

## 2020-05-03 DIAGNOSIS — E785 Hyperlipidemia, unspecified: Secondary | ICD-10-CM | POA: Diagnosis not present

## 2020-05-03 DIAGNOSIS — Z79899 Other long term (current) drug therapy: Secondary | ICD-10-CM | POA: Insufficient documentation

## 2020-05-03 DIAGNOSIS — Z887 Allergy status to serum and vaccine status: Secondary | ICD-10-CM | POA: Insufficient documentation

## 2020-05-03 DIAGNOSIS — K222 Esophageal obstruction: Secondary | ICD-10-CM | POA: Diagnosis not present

## 2020-05-03 DIAGNOSIS — Z888 Allergy status to other drugs, medicaments and biological substances status: Secondary | ICD-10-CM | POA: Diagnosis not present

## 2020-05-03 DIAGNOSIS — E1122 Type 2 diabetes mellitus with diabetic chronic kidney disease: Secondary | ICD-10-CM | POA: Diagnosis not present

## 2020-05-03 DIAGNOSIS — K2100 Gastro-esophageal reflux disease with esophagitis, without bleeding: Secondary | ICD-10-CM | POA: Diagnosis not present

## 2020-05-03 DIAGNOSIS — I420 Dilated cardiomyopathy: Secondary | ICD-10-CM | POA: Diagnosis not present

## 2020-05-03 DIAGNOSIS — K922 Gastrointestinal hemorrhage, unspecified: Secondary | ICD-10-CM | POA: Diagnosis not present

## 2020-05-03 DIAGNOSIS — I480 Paroxysmal atrial fibrillation: Secondary | ICD-10-CM | POA: Diagnosis not present

## 2020-05-03 DIAGNOSIS — I130 Hypertensive heart and chronic kidney disease with heart failure and stage 1 through stage 4 chronic kidney disease, or unspecified chronic kidney disease: Secondary | ICD-10-CM | POA: Insufficient documentation

## 2020-05-03 HISTORY — PX: SAVORY DILATION: SHX5439

## 2020-05-03 HISTORY — PX: ESOPHAGOGASTRODUODENOSCOPY (EGD) WITH PROPOFOL: SHX5813

## 2020-05-03 LAB — CUP PACEART REMOTE DEVICE CHECK
Battery Remaining Longevity: 114 mo
Battery Voltage: 3.01 V
Brady Statistic AP VP Percent: 98.64 %
Brady Statistic AP VS Percent: 0.06 %
Brady Statistic AS VP Percent: 0.84 %
Brady Statistic AS VS Percent: 0.46 %
Brady Statistic RA Percent Paced: 98.88 %
Brady Statistic RV Percent Paced: 99.48 %
Date Time Interrogation Session: 20210713070411
Implantable Lead Implant Date: 20201002
Implantable Lead Implant Date: 20201002
Implantable Lead Implant Date: 20201002
Implantable Lead Location: 753858
Implantable Lead Location: 753859
Implantable Lead Location: 753860
Implantable Lead Model: 4598
Implantable Lead Model: 5076
Implantable Lead Model: 5076
Implantable Pulse Generator Implant Date: 20201002
Lead Channel Impedance Value: 304 Ohm
Lead Channel Impedance Value: 323 Ohm
Lead Channel Impedance Value: 342 Ohm
Lead Channel Impedance Value: 342 Ohm
Lead Channel Impedance Value: 380 Ohm
Lead Channel Impedance Value: 399 Ohm
Lead Channel Impedance Value: 475 Ohm
Lead Channel Impedance Value: 513 Ohm
Lead Channel Impedance Value: 551 Ohm
Lead Channel Impedance Value: 570 Ohm
Lead Channel Impedance Value: 589 Ohm
Lead Channel Impedance Value: 589 Ohm
Lead Channel Impedance Value: 608 Ohm
Lead Channel Impedance Value: 627 Ohm
Lead Channel Pacing Threshold Amplitude: 0.625 V
Lead Channel Pacing Threshold Amplitude: 0.75 V
Lead Channel Pacing Threshold Amplitude: 1.5 V
Lead Channel Pacing Threshold Pulse Width: 0.4 ms
Lead Channel Pacing Threshold Pulse Width: 0.4 ms
Lead Channel Pacing Threshold Pulse Width: 0.4 ms
Lead Channel Sensing Intrinsic Amplitude: 1.5 mV
Lead Channel Sensing Intrinsic Amplitude: 1.5 mV
Lead Channel Sensing Intrinsic Amplitude: 31.625 mV
Lead Channel Sensing Intrinsic Amplitude: 31.625 mV
Lead Channel Setting Pacing Amplitude: 1.5 V
Lead Channel Setting Pacing Amplitude: 2 V
Lead Channel Setting Pacing Amplitude: 2 V
Lead Channel Setting Pacing Pulse Width: 0.4 ms
Lead Channel Setting Pacing Pulse Width: 0.4 ms
Lead Channel Setting Sensing Sensitivity: 1.2 mV

## 2020-05-03 LAB — GLUCOSE, CAPILLARY: Glucose-Capillary: 125 mg/dL — ABNORMAL HIGH (ref 70–99)

## 2020-05-03 SURGERY — ESOPHAGOGASTRODUODENOSCOPY (EGD) WITH PROPOFOL
Anesthesia: Monitor Anesthesia Care

## 2020-05-03 MED ORDER — SODIUM CHLORIDE 0.9 % IV SOLN: INTRAVENOUS | Status: DC

## 2020-05-03 MED ORDER — LIDOCAINE 2% (20 MG/ML) 5 ML SYRINGE
INTRAMUSCULAR | Status: DC | PRN
  Administered 2020-05-03: 40 mg via INTRAVENOUS

## 2020-05-03 MED ORDER — PROPOFOL 10 MG/ML IV BOLUS
INTRAVENOUS | Status: DC | PRN
  Administered 2020-05-03 (×6): 20 mg via INTRAVENOUS
  Administered 2020-05-03: 40 mg via INTRAVENOUS
  Administered 2020-05-03 (×2): 20 mg via INTRAVENOUS

## 2020-05-03 MED ORDER — PROPOFOL 500 MG/50ML IV EMUL
INTRAVENOUS | Status: AC
  Filled 2020-05-03: qty 50

## 2020-05-03 SURGICAL SUPPLY — 14 items

## 2020-05-03 NOTE — Anesthesia Procedure Notes (Signed)
Date/Time: 05/03/2020 2:23 PM Performed by: Talbot Grumbling, CRNA Oxygen Delivery Method: Simple face mask

## 2020-05-03 NOTE — Op Note (Addendum)
[facility] Patient Name: Krista Mason Procedure Date: 05/03/2020 MRN: [id number] Attending MD: Ronald Lobo , MD Date of Birth: January 19, 1936 CSN: [id number] Age: 84 Admit Type: Outpatient Procedure:                Upper GI endoscopy Indications:              Esophageal dysphagia Providers:                Ronald Lobo, MD, Carmie End, RN, Cherylynn Ridges, Technician, Marla Roe, CRNA Referring MD:              Medicines:                Monitored Anesthesia Care Complications:            No immediate complications. Estimated Blood Loss:     Estimated blood loss was minimal. Procedure:                Pre-Anesthesia Assessment:                           - Prior to the procedure, a History and Physical                            was performed, and patient medications and                            allergies were reviewed. The patient's tolerance of                            previous anesthesia was also reviewed. The risks                            and benefits of the procedure and the sedation                            options and risks were discussed with the patient.                            All questions were answered, and informed consent                            was obtained. Prior Anticoagulants: The patient has                            taken Eliquis (apixaban), last dose was 2 days                            prior to procedure. ASA Grade Assessment: III - A                            patient with severe systemic disease. After  reviewing the risks and benefits, the patient was                            deemed in satisfactory condition to undergo the                            procedure.                           After obtaining informed consent, the endoscope was                            passed under direct vision. Throughout the                            procedure, the patient's blood  pressure, pulse, and                            oxygen saturations were monitored continuously. The                            [id number] ([id number] Olympus ultra slim endoscope                            was introduced through the mouth, and advanced to                            the second part of duodenum. The upper GI endoscopy                            was accomplished without difficulty. The patient                            tolerated the procedure well. Scope In: Scope Out: Findings:      The larynx was normal.      Moderately severe esophagitis, characterized by exduate but no overt       ulceration, with no bleeding was found in the mid esophagus.      One benign-appearing, intrinsic moderate stenosis was found. The       stenosis was readily traversed with the ULTRASLIM upper endoscope. A       guidewire was placed and the scope was withdrawn. Dilation was performed       with a Savary dilator with no resistance at 7 mm. No mucosal disruption       on inspection thereafter. Then a guidewire was placed and the scope was       withdrawn and further dilation was performed with a Savary dilator with       mild resistance at 9 mm. The dilation site was examined following       endoscope reinsertion and showed moderate mucosal disruption, without       deep laceration, active bleeding, or evidence of perforation. Estimated       blood loss was minimal. I elected not to attempt further dilatation       today.      The entire examined stomach was normal.  The cardia and gastric fundus were normal on retroflexion.      The examined duodenum was normal. Impression:               - Normal larynx.                           - Moderately severe erosive esophagitis with no                            bleeding.                           - Benign-appearing esophageal stenosis. Dilated to                            9 mm by Savary technique.                           - Normal stomach.                            - Normal examined duodenum.                           - No specimens collected. Moderate Sedation:      This patient was sedated with monitored anesthesia care, not moderate       sedation. Recommendation:           - Observe patient's clinical course following                            today's procedure with therapeutic intervention.                            The patient has been asked to call us in about 2                            weeks with a progress report; if still having                            significant dysphagia, further dilatation could be                            scheduled.                           - Continue present medications.                           - Return to my office PRN.                           - Resume Eliquis (apixaban) at prior dose today. Procedure Code(s):        --- Professional ---                           [phone], Esophagogastroduodenoscopy, flexible,  transoral; with insertion of guide wire followed by                            passage of dilator(s) through esophagus over guide                            wire Diagnosis Code(s):        --- Professional ---                           K20.80, Other esophagitis without bleeding                           K22.2, Esophageal obstruction                           R13.14, Dysphagia, pharyngoesophageal phase CPT copyright 2019 [employer]. All rights reserved. The codes documented in this report are preliminary and upon coder review may  be revised to meet current compliance requirements. Ronald Lobo, MD 05/03/2020 3:08:05 PM This report has been signed electronically. Number of Addenda: 0

## 2020-05-03 NOTE — Discharge Instructions (Signed)
YOU HAD AN ENDOSCOPIC PROCEDURE TODAY: Refer to the procedure report and other information in the discharge instructions given to you for any specific questions about what was found during the examination. If this information does not answer your questions, please call [facility] office at [phone] to clarify.   YOU SHOULD EXPECT: Some feelings of bloating in the abdomen. Passage of more gas than usual. Walking can help get rid of the air that was put into your GI tract during the procedure and reduce the bloating. If you had a lower endoscopy (such as a colonoscopy or flexible sigmoidoscopy) you may notice spotting of blood in your stool or on the toilet paper. Some abdominal soreness may be present for a day or two, also.  DIET:  Follow Dilation Diet per MD Buccini orders  ACTIVITY: Your care partner should take you home directly after the procedure. You should plan to take it easy, moving slowly for the rest of the day. You can resume normal activity the day after the procedure however YOU SHOULD NOT DRIVE, use power tools, machinery or perform tasks that involve climbing or major physical exertion for 24 hours (because of the sedation medicines used during the test).   SYMPTOMS TO REPORT IMMEDIATELY: A gastroenterologist can be reached at any hour. Please call [phone]  for any of the following symptoms:  . Following upper endoscopy (EGD, EUS, ERCP, esophageal dilation) Vomiting of blood or coffee ground material  New, significant abdominal pain  New, significant chest pain or pain under the shoulder blades  Painful or persistently difficult swallowing  New shortness of breath  Black, tarry-looking or red, bloody stools  FOLLOW UP:  If any biopsies were taken you will be contacted by phone or by letter within the next 1-3 weeks. Call [phone]  if you have not heard about the biopsies in 3 weeks.  Please also call with any specific questions about appointments or follow up tests.

## 2020-05-03 NOTE — H&P (Signed)
Krista Mason is an 84 y.o. female.   Chief Complaint: Dysphagia HPI:   This is an 84 year old female with recurrent esophageal strictures and associated dysphagia, last dilated to 11 mm, October 2020.    She has a history of heme positive stool, chronic anemia, and chronic anticoagulation with congestive cardiomyopathy and estimated ejection fraction of 20% with atrial fibrillation, a pacemaker, chronic renal insufficiency, and COPD.    She had a GI bleed due to erosive esophagitis last October.    With the help of iron infusions and Aranesp, the patient's severe anemia has improved recently to the point where she feels her energy is quite a bit better.  A recent hemoglobin obtained through her nephrologist's office was 13.  In anticipation of this procedure, the patient has been off her Eliquis for almost 48 hours.  Past Medical History:  Diagnosis Date  . Atrial fibrillation ([place])   . Chronic combined systolic and diastolic CHF (congestive heart failure) ([place])    a. LV dysfcuntion felt to be related to PVCs  . Chronic kidney disease   . Diabetes mellitus without complication ([place])   . GERD (gastroesophageal reflux disease)   . Hx of echocardiogram    Echo (1/16):  EF 60-65%, no RWMA, Gr 1 DD, mod AI, mod MR, mild LAE  . Hyperkalemia   . Hyperlipidemia   . Hypertension   . NICM (nonischemic cardiomyopathy) ([place])    a. 12/2018 Echo: EF 35%, diff HK w/ septal-lateral dyssynchrony. Nl RV fxn; b. 07/2019 s/p MDT [id number] [name] CRT-P MRI SureScan device (ser #: [id number])  . Orthostatic hypotension   . PVC (premature ventricular contraction)    a. s/p PVC ablation in 11/2016  . Rheumatic fever     Past Surgical History:  Procedure Laterality Date  . BIOPSY  08/20/2019   Procedure: BIOPSY;  Surgeon: Otis Brace, MD;  Location: [facility];  Service: Gastroenterology;;  . BIV PACEMAKER INSERTION CRT-P N/A 07/24/2019   Procedure: BIV PACEMAKER INSERTION CRT-P;  Surgeon:  Constance Haw, MD;  Location: [facility];  Service: Cardiovascular;  Laterality: N/A;  . COLONOSCOPY WITH PROPOFOL N/A 08/22/2019   Procedure: COLONOSCOPY WITH PROPOFOL;  Surgeon: Laurence Spates, MD;  Location: [facility];  Service: Endoscopy;  Laterality: N/A;  . ESOPHAGOGASTRODUODENOSCOPY (EGD) WITH PROPOFOL N/A 09/25/2018   Procedure: ESOPHAGOGASTRODUODENOSCOPY (EGD) WITH PROPOFOL;  Surgeon: Ronald Lobo, MD;  Location: [facility];  Service: Endoscopy;  Laterality: N/A;  . ESOPHAGOGASTRODUODENOSCOPY (EGD) WITH PROPOFOL N/A 10/29/2018   Procedure: ESOPHAGOGASTRODUODENOSCOPY (EGD) WITH PROPOFOL;  Surgeon: Ronald Lobo, MD;  Location: [facility];  Service: Endoscopy;  Laterality: N/A;  . ESOPHAGOGASTRODUODENOSCOPY (EGD) WITH PROPOFOL N/A 08/20/2019   Procedure: ESOPHAGOGASTRODUODENOSCOPY (EGD) WITH PROPOFOL;  Surgeon: Otis Brace, MD;  Location: [facility];  Service: Gastroenterology;  Laterality: N/A;  . HEMOSTASIS CLIP PLACEMENT  08/22/2019   Procedure: HEMOSTASIS CLIP PLACEMENT;  Surgeon: Laurence Spates, MD;  [facility];  Service: Endoscopy;;  . KNEE SURGERY Left 2009  . OVARIAN CYST REMOVAL    . POLYPECTOMY  08/22/2019   Procedure: POLYPECTOMY;  Surgeon: Laurence Spates, MD;  Location: [place];  Service: Endoscopy;;  . PVC ABLATION N/A 11/30/2016   Procedure: PVC Ablation;  Surgeon: Will [name], MD;  Location: [facility];  Service: Cardiovascular;  Laterality: N/A;  . RIGHT HEART CATH N/A 01/31/2018   Procedure: RIGHT HEART CATH;  Surgeon: Jolaine Artist, MD;  Location: [place] [facility];  Service: Cardiovascular;  Laterality: N/A;  .  RIGHT/LEFT HEART CATH AND CORONARY ANGIOGRAPHY N/A 01/25/2017   Procedure: Right/Left Heart Cath and Coronary Angiography;  Surgeon: Jolaine Artist, MD;  Location: [place];  Service: Cardiovascular;  Laterality: N/A;  . SAVORY DILATION N/A 09/25/2018   Procedure: SAVORY DILATION;   Surgeon: Ronald Lobo, MD;  Location: [facility];  Service: Endoscopy;  Laterality: N/A;  . SAVORY DILATION N/A 10/29/2018   Procedure: SAVORY DILATION;  Surgeon: Ronald Lobo, MD;  Location: [facility];  Service: Endoscopy;  Laterality: N/A;  . SAVORY DILATION N/A 08/20/2019   Procedure: SAVORY DILATION;  Surgeon: Otis Brace, MD;  Location: [facility];  Service: Gastroenterology;  Laterality: N/A;    Family History  Problem Relation Age of Onset  . Heart attack Father   . Heart disease Father   . Cancer Father   . Hypertension Father   . Hypertension Brother   . Stroke Brother   . Hypertension Brother   . Diabetes Son   . Hyperlipidemia Son   . Hypertension Son    Social History:  reports that she quit smoking about 3 years ago. She has a 45.00 pack-year smoking history. She has never used smokeless tobacco. She reports that she does not drink alcohol and does not use drugs.  Allergies:  Allergies  Allergen Reactions  . Epinephrine Other (See Comments)    "feels like heart is beating out of chest"  . Penicillins Itching and Rash    Amoxicillin ok- IM pen is what gives reaction  Did it involve swelling of the face/tongue/throat, SOB, or low BP? No Did it involve sudden or severe rash/hives, skin peeling, or any reaction on the inside of your mouth or nose? No Did you need to seek medical attention at a hospital or doctor's office? No When did it last happen?10 + year If all above answers are "NO", may proceed with cephalosporin use.   [name] Retacrit [Epoetin (Alfa)] Other (See Comments)    Chills, low grade fever, body aches, fatigue, nausea for 3 days.  [name] [Cefaclor] Itching  . Crestor [Rosuvastatin Calcium] Nausea Only  . Influenza Vaccines     Swelling and redness at injection site, fever  . Nsaids     Pt states she is not taking because of kidney function  . Percocet [Oxycodone-Acetaminophen] Nausea And Vomiting  . Prednisone     Dizziness,  spiked blood sugar    Medications Prior to Admission  Medication Sig Dispense Refill  . amiodarone (PACERONE) 200 MG tablet Take 1 tablet (200 mg total) by mouth daily. 90 tablet 3  . apixaban (ELIQUIS) 2.5 MG TABS tablet Take 1 tablet (2.5 mg total) by mouth 2 (two) times daily.    [name] atorvastatin (LIPITOR) 20 MG tablet TAKE 1 TABLET BY MOUTH EVERY DAY (Patient taking differently: Take 20 mg by mouth daily. ) 90 tablet 3  . carvedilol (COREG) 12.5 MG tablet Take 1 tablet (12.5 mg total) by mouth 2 (two) times daily. 180 tablet 3  . cholecalciferol (VITAMIN D3) 25 MCG (1000 UNIT) tablet Take 1,000 Units by mouth daily.     . diphenhydrAMINE (BENADRYL) 25 mg capsule Take 25 mg by mouth every 6 (six) hours as needed for allergies.     . feeding supplement, ENSURE ENLIVE, (ENSURE ENLIVE) LIQD Take 237 mLs by mouth 2 (two) times daily between meals. (Patient taking differently: Take 237 mLs by mouth at bedtime. ) 237 mL 12  . furosemide (LASIX) 40 MG tablet Take 1 tablet (40 mg  total) by mouth every other day. (Patient taking differently: Take 20-40 mg by mouth See admin instructions. Take 40 mg by mouth every other day, alternating with 20 mg) 90 tablet 2  . hydrALAZINE (APRESOLINE) 50 MG tablet TAKE 1 TABLET BY MOUTH THREE TIMES A DAY (Patient taking differently: Take 50 mg by mouth in the morning and at bedtime. ) 270 tablet 2  . pantoprazole (PROTONIX) 40 MG tablet Take 1 tablet (40 mg total) by mouth 2 (two) times daily before a meal. 60 tablet 1  . vitamin B-12 (CYANOCOBALAMIN) 1000 MCG tablet Take 1 tablet (1,000 mcg total) by mouth daily. 30 tablet 0  . Camphor-Eucalyptus-Menthol (VICKS VAPORUB EX) Apply 1 application topically daily as needed (toe fungus).    Vladimir Faster Glycol-Propyl Glycol (SYSTANE OP) Place 1 drop into both eyes 2 (two) times daily as needed (dryness).      Results for orders placed or performed during the hospital encounter of 05/03/20 (from the past 48 hour(s))   Glucose, capillary     Status: Abnormal   Collection Time: 05/03/20  1:38 PM  Result Value Ref Range   Glucose-Capillary 125 (H) 70 - 99 mg/dL    Comment: Glucose reference range applies only to samples taken after fasting for at least 8 hours.   No results found.  Review of Systems  Blood pressure (!) 167/82, pulse 70, temperature 97.7 F (36.5 C), temperature source Oral, resp. rate 18, height 5\' 5"  (1.651 m), weight 67.1 kg, SpO2 100 %. Physical Exam the patient looks better than her medical problems might suggest.  She is anicteric and without pallor.  She is cognitively intact and in good spirits.  Her chest is clear and the heart at this time has a normal rhythm without a murmur appreciated.  The abdomen is slightly adipose, soft and nontender.  Assessment/Plan Medically stable for esophageal dilatation, which the patient has requested because of problems with frequent choking with almost every meal.  The risks have been reviewed with the patient at her recent office visit.  We will plan to try to be on the conservative side with respect to the dilatation, to minimize risk of perforation which, in her case, could be catastrophic given her other medical issues.  Cleotis Nipper, MD 05/03/2020, 2:17 PM

## 2020-05-03 NOTE — Anesthesia Preprocedure Evaluation (Addendum)
Anesthesia Evaluation  Patient identified by MRN, date of birth, ID band Patient awake    Reviewed: Allergy & Precautions, NPO status , Patient's Chart, lab work & pertinent test results  Airway Mallampati: II  TM Distance: >3 FB Neck ROM: Full    Dental  (+) Edentulous Upper, Edentulous Lower, Upper Dentures, Lower Dentures   Pulmonary former smoker,    breath sounds clear to auscultation       Cardiovascular hypertension, + Peripheral Vascular Disease  (-) pacemaker Rhythm:Regular Rate:Normal  ECHO 3/17 Left Ventricle: Left ventricular ejection fraction, by estimation, is 45  to 50%. The left ventricle has mildly decreased function. The left  ventricle demonstrates global hypokinesis. The left ventricular internal  cavity size was normal in size. There is  mild concentric left ventricular hypertrophy.    Neuro/Psych    GI/Hepatic GERD  ,  Endo/Other  diabetes  Renal/GU Renal disease     Musculoskeletal   Abdominal   Peds  Hematology  (+) Blood dyscrasia, anemia ,   Anesthesia Other Findings   Reproductive/Obstetrics                            Anesthesia Physical  Anesthesia Plan  ASA: III  Anesthesia Plan: MAC   Post-op Pain Management:    Induction: Intravenous  PONV Risk Score and Plan: Propofol infusion  Airway Management Planned: Simple Face Mask  Additional Equipment:   Intra-op Plan:   Post-operative Plan:   Informed Consent: I have reviewed the patients History and Physical, chart, labs and discussed the procedure including the risks, benefits and alternatives for the proposed anesthesia with the patient or authorized representative who has indicated his/her understanding and acceptance.       Plan Discussed with: CRNA and Anesthesiologist  Anesthesia Plan Comments:         Anesthesia Quick Evaluation

## 2020-05-03 NOTE — Transfer of Care (Signed)
Immediate Anesthesia Transfer of Care Note  Patient: Krista Mason  Procedure(s) Performed: ESOPHAGOGASTRODUODENOSCOPY (EGD) WITH PROPOFOL (N/A ) SAVORY DILATION (N/A )  Patient Location: PACU  Anesthesia Type:MAC  Level of Consciousness: sedated  Airway & Oxygen Therapy: Patient Spontanous Breathing and Patient connected to face mask oxygen  Post-op Assessment: Report given to RN and Post -op Vital signs reviewed and stable  Post vital signs: Reviewed and stable  Last Vitals:  Vitals Value Taken Time  BP 105/42 05/03/20 1446  Temp    Pulse 70 05/03/20 1447  Resp 15 05/03/20 1447  SpO2 100 % 05/03/20 1447  Vitals shown include unvalidated device data.  Last Pain:  Vitals:   05/03/20 1333  TempSrc: Oral  PainSc: 0-No pain         Complications: No complications documented.

## 2020-05-04 ENCOUNTER — Other Ambulatory Visit (HOSPITAL_COMMUNITY): Payer: Self-pay | Admitting: Internal Medicine

## 2020-05-04 NOTE — Anesthesia Postprocedure Evaluation (Signed)
Anesthesia Post Note  Patient: [name]  Procedure(s) Performed: ESOPHAGOGASTRODUODENOSCOPY (EGD) WITH PROPOFOL (N/A ) SAVORY DILATION (N/A )     Patient location during evaluation: PACU Anesthesia Type: MAC Level of consciousness: awake and alert Pain management: pain level controlled Vital Signs Assessment: post-procedure vital signs reviewed and stable Respiratory status: spontaneous breathing, nonlabored ventilation, respiratory function stable and patient connected to nasal cannula oxygen Cardiovascular status: stable and blood pressure returned to baseline Postop Assessment: no apparent nausea or vomiting Anesthetic complications: no   No complications documented.  Last Vitals:  Vitals:   05/03/20 1447 05/03/20 1500  BP: (!) 105/42 (!) 104/41  Pulse: 70 70  Resp: 13 13  Temp: 36.6 C   SpO2: 100% 99%    Last Pain:  Vitals:   05/03/20 1500  TempSrc:   PainSc: 0-No pain                 Krista Mason

## 2020-05-05 NOTE — Progress Notes (Signed)
Remote pacemaker transmission.   

## 2020-05-06 ENCOUNTER — Encounter (HOSPITAL_COMMUNITY): Payer: Self-pay | Admitting: Gastroenterology

## 2020-05-10 ENCOUNTER — Ambulatory Visit (HOSPITAL_COMMUNITY)
Admission: RE | Admit: 2020-05-10 | Discharge: 2020-05-10 | Disposition: A | Discharge status: Home / Self Care | Payer: Medicare HMO | Source: Ambulatory Visit | Attending: Nephrology | Admitting: Nephrology

## 2020-05-10 ENCOUNTER — Other Ambulatory Visit: Payer: Self-pay

## 2020-05-10 VITALS — BP 163/68 | HR 70 | Temp 97.4°F | Resp 20

## 2020-05-10 DIAGNOSIS — N183 Chronic kidney disease, stage 3 unspecified: Secondary | ICD-10-CM | POA: Insufficient documentation

## 2020-05-10 LAB — POCT HEMOGLOBIN-HEMACUE: Hemoglobin: 10.8 g/dL — ABNORMAL LOW (ref 12.0–15.0)

## 2020-05-10 LAB — IRON AND TIBC
Iron: 62 ug/dL (ref 28–170)
Saturation Ratios: 17 % (ref 10.4–31.8)
TIBC: 370 ug/dL (ref 250–450)
UIBC: 308 ug/dL

## 2020-05-10 LAB — FERRITIN: Ferritin: 75 ng/mL (ref 11–307)

## 2020-05-10 MED ORDER — DARBEPOETIN ALFA 100 MCG/0.5ML IJ SOSY
PREFILLED_SYRINGE | INTRAMUSCULAR | Status: AC
  Filled 2020-05-10: qty 0.5

## 2020-05-10 MED ORDER — DARBEPOETIN ALFA 100 MCG/0.5ML IJ SOSY
100.0000 ug | PREFILLED_SYRINGE | INTRAMUSCULAR | Status: DC
  Administered 2020-05-10: 100 ug via SUBCUTANEOUS

## 2020-05-17 ENCOUNTER — Encounter (HOSPITAL_COMMUNITY): Payer: Medicare HMO

## 2020-05-19 DIAGNOSIS — I739 Peripheral vascular disease, unspecified: Secondary | ICD-10-CM | POA: Diagnosis not present

## 2020-05-19 DIAGNOSIS — N184 Chronic kidney disease, stage 4 (severe): Secondary | ICD-10-CM | POA: Diagnosis not present

## 2020-05-19 DIAGNOSIS — D509 Iron deficiency anemia, unspecified: Secondary | ICD-10-CM | POA: Diagnosis not present

## 2020-05-19 DIAGNOSIS — K2210 Ulcer of esophagus without bleeding: Secondary | ICD-10-CM | POA: Diagnosis not present

## 2020-05-19 DIAGNOSIS — I129 Hypertensive chronic kidney disease with stage 1 through stage 4 chronic kidney disease, or unspecified chronic kidney disease: Secondary | ICD-10-CM | POA: Diagnosis not present

## 2020-05-19 DIAGNOSIS — N2581 Secondary hyperparathyroidism of renal origin: Secondary | ICD-10-CM | POA: Diagnosis not present

## 2020-05-19 DIAGNOSIS — I5022 Chronic systolic (congestive) heart failure: Secondary | ICD-10-CM | POA: Diagnosis not present

## 2020-05-27 ENCOUNTER — Other Ambulatory Visit (HOSPITAL_COMMUNITY): Payer: Self-pay | Admitting: *Deleted

## 2020-05-27 DIAGNOSIS — I480 Paroxysmal atrial fibrillation: Secondary | ICD-10-CM

## 2020-05-27 DIAGNOSIS — I491 Atrial premature depolarization: Secondary | ICD-10-CM

## 2020-05-27 MED ORDER — AMIODARONE HCL 100 MG PO TABS: 200.0000 mg | ORAL_TABLET | Freq: Every day | ORAL | 1 refills | Status: DC

## 2020-05-30 ENCOUNTER — Other Ambulatory Visit: Payer: Self-pay | Admitting: Gastroenterology

## 2020-05-31 ENCOUNTER — Encounter (HOSPITAL_COMMUNITY): Payer: Self-pay | Admitting: Gastroenterology

## 2020-05-31 ENCOUNTER — Other Ambulatory Visit (HOSPITAL_COMMUNITY)
Admission: RE | Admit: 2020-05-31 | Discharge: 2020-05-31 | Disposition: A | Discharge status: Home / Self Care | Payer: Medicare HMO | Source: Ambulatory Visit | Attending: Gastroenterology | Admitting: Gastroenterology

## 2020-05-31 ENCOUNTER — Other Ambulatory Visit: Payer: Self-pay

## 2020-05-31 DIAGNOSIS — Z20822 Contact with and (suspected) exposure to covid-19: Secondary | ICD-10-CM | POA: Insufficient documentation

## 2020-05-31 DIAGNOSIS — Z01812 Encounter for preprocedural laboratory examination: Secondary | ICD-10-CM | POA: Diagnosis not present

## 2020-05-31 LAB — SARS CORONAVIRUS 2 (TAT 6-24 HRS): SARS Coronavirus 2: NEGATIVE

## 2020-06-01 NOTE — Progress Notes (Signed)
Pre call done for endo procedure tomorrow 06/02/20. Patient states has been quarantined since covid test, will be NPO past midnight, and has a driver taking her home post procedure. All questions addressed.

## 2020-06-02 ENCOUNTER — Ambulatory Visit (HOSPITAL_COMMUNITY)
Admission: RE | Admit: 2020-06-02 | Discharge: 2020-06-02 | Disposition: A | Discharge status: Home / Self Care | Payer: Medicare HMO | Attending: Gastroenterology | Admitting: Gastroenterology

## 2020-06-02 ENCOUNTER — Encounter (HOSPITAL_COMMUNITY)
Admission: RE | Disposition: A | Discharge status: Home / Self Care | Payer: Self-pay | Source: Home / Self Care | Attending: Gastroenterology

## 2020-06-02 ENCOUNTER — Ambulatory Visit (HOSPITAL_COMMUNITY): Payer: Medicare HMO | Admitting: Anesthesiology

## 2020-06-02 ENCOUNTER — Encounter (HOSPITAL_COMMUNITY): Payer: Self-pay | Admitting: Gastroenterology

## 2020-06-02 ENCOUNTER — Other Ambulatory Visit: Payer: Self-pay

## 2020-06-02 DIAGNOSIS — K449 Diaphragmatic hernia without obstruction or gangrene: Secondary | ICD-10-CM | POA: Diagnosis not present

## 2020-06-02 DIAGNOSIS — Z79899 Other long term (current) drug therapy: Secondary | ICD-10-CM | POA: Diagnosis not present

## 2020-06-02 DIAGNOSIS — Z87891 Personal history of nicotine dependence: Secondary | ICD-10-CM | POA: Insufficient documentation

## 2020-06-02 DIAGNOSIS — Z7902 Long term (current) use of antithrombotics/antiplatelets: Secondary | ICD-10-CM | POA: Insufficient documentation

## 2020-06-02 DIAGNOSIS — I4891 Unspecified atrial fibrillation: Secondary | ICD-10-CM | POA: Diagnosis not present

## 2020-06-02 DIAGNOSIS — R1314 Dysphagia, pharyngoesophageal phase: Secondary | ICD-10-CM | POA: Insufficient documentation

## 2020-06-02 DIAGNOSIS — E1122 Type 2 diabetes mellitus with diabetic chronic kidney disease: Secondary | ICD-10-CM | POA: Diagnosis not present

## 2020-06-02 DIAGNOSIS — N184 Chronic kidney disease, stage 4 (severe): Secondary | ICD-10-CM | POA: Diagnosis not present

## 2020-06-02 DIAGNOSIS — I493 Ventricular premature depolarization: Secondary | ICD-10-CM | POA: Diagnosis not present

## 2020-06-02 DIAGNOSIS — K222 Esophageal obstruction: Secondary | ICD-10-CM | POA: Insufficient documentation

## 2020-06-02 DIAGNOSIS — E1151 Type 2 diabetes mellitus with diabetic peripheral angiopathy without gangrene: Secondary | ICD-10-CM | POA: Insufficient documentation

## 2020-06-02 DIAGNOSIS — R1310 Dysphagia, unspecified: Secondary | ICD-10-CM | POA: Diagnosis not present

## 2020-06-02 DIAGNOSIS — E785 Hyperlipidemia, unspecified: Secondary | ICD-10-CM | POA: Insufficient documentation

## 2020-06-02 DIAGNOSIS — K2210 Ulcer of esophagus without bleeding: Secondary | ICD-10-CM | POA: Diagnosis not present

## 2020-06-02 DIAGNOSIS — I129 Hypertensive chronic kidney disease with stage 1 through stage 4 chronic kidney disease, or unspecified chronic kidney disease: Secondary | ICD-10-CM | POA: Insufficient documentation

## 2020-06-02 DIAGNOSIS — J439 Emphysema, unspecified: Secondary | ICD-10-CM | POA: Insufficient documentation

## 2020-06-02 HISTORY — DX: Presence of dental prosthetic device (complete) (partial): Z97.2

## 2020-06-02 HISTORY — PX: SAVORY DILATION: SHX5439

## 2020-06-02 HISTORY — PX: ESOPHAGOGASTRODUODENOSCOPY (EGD) WITH PROPOFOL: SHX5813

## 2020-06-02 HISTORY — DX: Anemia, unspecified: D64.9

## 2020-06-02 LAB — GLUCOSE, CAPILLARY: Glucose-Capillary: 138 mg/dL — ABNORMAL HIGH (ref 70–99)

## 2020-06-02 SURGERY — ESOPHAGOGASTRODUODENOSCOPY (EGD) WITH PROPOFOL
Anesthesia: Monitor Anesthesia Care

## 2020-06-02 MED ORDER — PROPOFOL 500 MG/50ML IV EMUL
INTRAVENOUS | Status: DC | PRN
  Administered 2020-06-02: 75 ug/kg/min via INTRAVENOUS

## 2020-06-02 MED ORDER — SODIUM CHLORIDE 0.9 % IV SOLN: INTRAVENOUS | Status: DC

## 2020-06-02 MED ORDER — PROPOFOL 500 MG/50ML IV EMUL
INTRAVENOUS | Status: AC
  Filled 2020-06-02: qty 50

## 2020-06-02 MED ORDER — PROPOFOL 10 MG/ML IV BOLUS
INTRAVENOUS | Status: DC | PRN
  Administered 2020-06-02: 40 mg via INTRAVENOUS
  Administered 2020-06-02: 20 mg via INTRAVENOUS

## 2020-06-02 SURGICAL SUPPLY — 14 items

## 2020-06-02 NOTE — Op Note (Signed)
[facility] Patient Name: Krista Mason Procedure Date: 06/02/2020 MRN: [id number] Attending MD: Clarene Essex , MD Date of Birth: Oct 12, 1936 CSN: [id number] Age: 84 Admit Type: Outpatient Procedure:                Upper GI endoscopy Indications:              Dysphagia, Odynophagia Providers:                Josie Dixon, RN, Janee Morn, Technician,                            Lazaro Arms, Technician, Clarene Essex, MD Referring MD:              Medicines:                Propofol total dose 177 mg IV Complications:            No immediate complications. Estimated Blood Loss:     Estimated blood loss was minimal. Procedure:                Pre-Anesthesia Assessment:                           - Prior to the procedure, a History and Physical                            was performed, and patient medications and                            allergies were reviewed. The patient's tolerance of                            previous anesthesia was also reviewed. The risks                            and benefits of the procedure and the sedation                            options and risks were discussed with the patient.                            All questions were answered, and informed consent                            was obtained. Prior Anticoagulants: The patient has                            taken Eliquis (apixaban), last dose was 2 days                            prior to procedure. ASA Grade Assessment: III - A                            patient with severe systemic disease. After  reviewing the risks and benefits, the patient was                            deemed in satisfactory condition to undergo the                            procedure.                           After obtaining informed consent, the endoscope was                            passed under direct vision. Throughout the                            procedure, the patient's blood  pressure, pulse, and                            oxygen saturations were monitored continuously. The                            [id number]-[id number] ([id number] Olympus gastroscope was                            introduced through the mouth, and advanced to the                            second part of duodenum. The upper GI endoscopy was                            accomplished without difficulty. The patient                            tolerated the procedure well. Scope In: Scope Out: Findings:      One linear esophageal ulcer was found.      One benign-appearing, intrinsic moderate stenosis was found. The       stenosis was traversed. A TTS dilator was passed through the scope.       Dilation with a 08-02-11 mm balloon dilator was performed to 12 mm. The       dilation site was examined and showed mild mucosal disruption and mild       improvement in luminal narrowing.      A small hiatal hernia was present.      The entire examined stomach was normal.      The duodenal bulb, first portion of the duodenum and second portion of       the duodenum were normal.      The exam was otherwise without abnormality. Impression:               - Esophageal ulcer.                           - Benign-appearing esophageal stenosis. Dilated.                           - Small hiatal  hernia.                           - Normal stomach.                           - Normal duodenal bulb, first portion of the                            duodenum and second portion of the duodenum.                           - The examination was otherwise normal.                           - No specimens collected. Moderate Sedation:      Not Applicable - Patient had care per Anesthesia. Recommendation:           - Patient has a contact number available for                            emergencies. The signs and symptoms of potential                            delayed complications were discussed with the                            patient.  Return to normal activities tomorrow.                            Written discharge instructions were provided to the                            patient.                           - Clear liquid diet for 4 hours. And very slowly                            advance                           - Continue present medications. We will try some                            viscous lidocaine swish and swallow before meals                           - Resume Eliquis (apixaban) at prior dose tomorrow.                           - Return to GI clinic in 2 weeks.                           - Telephone GI clinic if symptomatic PRN.                           -  Repeat upper endoscopy in 1 month to check                            healing and for retreatment. Procedure Code(s):        --- Professional ---                           [phone], Esophagogastroduodenoscopy, flexible,                            transoral; with transendoscopic balloon dilation of                            esophagus (less than 30 mm diameter) Diagnosis Code(s):        --- Professional ---                           K22.10, Ulcer of esophagus without bleeding                           K22.2, Esophageal obstruction                           K44.9, Diaphragmatic hernia without obstruction or                            gangrene                           R13.10, Dysphagia, unspecified CPT copyright 2019 [employer] Association. All rights reserved. The codes documented in this report are preliminary and upon coder review may  be revised to meet current compliance requirements. Clarene Essex, MD 06/02/2020 9:48:35 AM This report has been signed electronically. Number of Addenda: 0

## 2020-06-02 NOTE — Anesthesia Postprocedure Evaluation (Signed)
Anesthesia Post Note  Patient: Krista Mason  Procedure(s) Performed: ESOPHAGOGASTRODUODENOSCOPY (EGD) WITH PROPOFOL and Savory Dilatation (N/A ) SAVORY DILATION w/ FLURO (N/A )     Patient location during evaluation: PACU Anesthesia Type: MAC Level of consciousness: awake and alert Pain management: pain level controlled Vital Signs Assessment: post-procedure vital signs reviewed and stable Respiratory status: spontaneous breathing, nonlabored ventilation, respiratory function stable and patient connected to nasal cannula oxygen Cardiovascular status: stable and blood pressure returned to baseline Postop Assessment: no apparent nausea or vomiting Anesthetic complications: no   No complications documented.  Last Vitals:  Vitals:   06/02/20 1000 06/02/20 1010  BP: (!) 180/56 (!) 170/60  Pulse: 69 70  Resp: 12 14  Temp:    SpO2: 100% 99%    Last Pain:  Vitals:   06/02/20 0940  TempSrc:   PainSc: 0-No pain                 Effie Berkshire

## 2020-06-02 NOTE — Transfer of Care (Signed)
Immediate Anesthesia Transfer of Care Note  Patient: Krista Mason  Procedure(s) Performed: Procedure(s): ESOPHAGOGASTRODUODENOSCOPY (EGD) WITH PROPOFOL and Savory Dilatation (N/A) SAVORY DILATION w/ FLURO (N/A)  Patient Location: PACU  Anesthesia Type:MAC  Level of Consciousness:  sedated, patient cooperative and responds to stimulation  Airway & Oxygen Therapy:Patient Spontanous Breathing and Patient connected to face mask oxgen  Post-op Assessment:  Report given to PACU RN and Post -op Vital signs reviewed and stable  Post vital signs:  Reviewed and stable  Last Vitals:  Vitals:   06/02/20 0749 06/02/20 0800  BP: (!) 196/59 (!) 174/55  Pulse: 70   Resp: 11   Temp: 36.8 C   SpO2: 320%     Complications: No apparent anesthesia complications

## 2020-06-02 NOTE — Progress Notes (Addendum)
Krista Mason 9:05 AM  Subjective: Patient with dysphagia and odynophagia and history of esophageal strictures and multiple dilations in her office computer chart in her hospital computer chart reviewed and her case discussed with my partner Dr. Alessandra Bevels and she has no new complaints and her Eliquis was stopped on Tuesday and the risks of the procedure was thoroughly discussed and her 2 previous dilations were reviewed as well as her last barium swallow  Objective: Vital signs stable afebrile no acute distress exam please see preassessment evaluation  Assessment: Dysphagia odynophagia and patient with history of significant stricture  Plan: Okay to proceed with endoscopy and probable dilation with anesthesia assistance and as above the risks warnings were discussed  [facility]  office [phone] After 5PM or if no answer call [phone]

## 2020-06-02 NOTE — Discharge Instructions (Signed)
Clear liquid diet this morning and if doing well may have heavier liquids and slowly advance to soft solids remember to chew your food well take your time drink plenty of liquids and continue your stomach medicines and we will call you in a numbing liquid medicine to try to see if that will help the pain and allow you to eat afterwards and follow-up with Dr. Cristina Gong in a few weeks  Start your Eliquis tomorrow (06/03/20)  YOU HAD AN ENDOSCOPIC PROCEDURE TODAY: Refer to the procedure report and other information in the discharge instructions given to you for any specific questions about what was found during the examination. If this information does not answer your questions, please call [facility] office at [phone] to clarify.   YOU SHOULD EXPECT: Some feelings of bloating in the abdomen. Passage of more gas than usual. Walking can help get rid of the air that was put into your GI tract during the procedure and reduce the bloating. If you had a lower endoscopy (such as a colonoscopy or flexible sigmoidoscopy) you may notice spotting of blood in your stool or on the toilet paper. Some abdominal soreness may be present for a day or two, also.  DIET: Please see above for diet. Avoid alcoholic beverages for 24 hours.   ACTIVITY: Your care partner should take you home directly after the procedure. You should plan to take it easy, moving slowly for the rest of the day. You can resume normal activity the day after the procedure however YOU SHOULD NOT DRIVE, use power tools, machinery or perform tasks that involve climbing or major physical exertion for 24 hours (because of the sedation medicines used during the test).   SYMPTOMS TO REPORT IMMEDIATELY: A gastroenterologist can be reached at any hour. Please call [phone]  for any of the following symptoms:   Following upper endoscopy (EGD, EUS, ERCP, esophageal dilation) Vomiting of blood or coffee ground material  New, significant abdominal pain   New, significant chest pain or pain under the shoulder blades  Painful or persistently difficult swallowing  New shortness of breath  Black, tarry-looking or red, bloody stools  FOLLOW UP:  If any biopsies were taken you will be contacted by phone or by letter within the next 1-3 weeks. Call [phone]  if you have not heard about the biopsies in 3 weeks.  Please also call with any specific questions about appointments or follow up tests.

## 2020-06-02 NOTE — Anesthesia Preprocedure Evaluation (Addendum)
Anesthesia Evaluation  Patient identified by MRN, date of birth, ID band Patient awake    Reviewed: Allergy & Precautions, NPO status , Patient's Chart, lab work & pertinent test results, reviewed documented beta blocker date and time   History of Anesthesia Complications Negative for: history of anesthetic complications  Airway Mallampati: II  TM Distance: >3 FB Neck ROM: Full    Dental  (+) Edentulous Lower, Edentulous Upper   Pulmonary COPD, former smoker,    Pulmonary exam normal        Cardiovascular hypertension, Pt. on medications and Pt. on home beta blockers + Peripheral Vascular Disease and +CHF  Normal cardiovascular exam+ dysrhythmias Atrial Fibrillation      Neuro/Psych negative neurological ROS  negative psych ROS   GI/Hepatic Neg liver ROS, GERD  Medicated and Poorly Controlled,  Endo/Other  diabetes, Type 2  Renal/GU Renal Insufficiency and CRFRenal disease     Musculoskeletal negative musculoskeletal ROS (+)   Abdominal   Peds  Hematology negative hematology ROS (+)   Anesthesia Other Findings   Reproductive/Obstetrics                            Anesthesia Physical Anesthesia Plan  ASA: III  Anesthesia Plan: MAC   Post-op Pain Management:    Induction: Intravenous  PONV Risk Score and Plan: 2 and Propofol infusion and Treatment may vary due to age or medical condition  Airway Management Planned: Natural Airway and Simple Face Mask  Additional Equipment: None  Intra-op Plan:   Post-operative Plan:   Informed Consent: I have reviewed the patients History and Physical, chart, labs and discussed the procedure including the risks, benefits and alternatives for the proposed anesthesia with the patient or authorized representative who has indicated his/her understanding and acceptance.       Plan Discussed with: CRNA  Anesthesia Plan Comments: (EKG:  Atrial  paced.   Echo:  1. Left ventricular ejection fraction, by estimation, is 45 to 50%. The  left ventricle has mildly decreased function. The left ventricle  demonstrates global hypokinesis. There is mild concentric left ventricular  hypertrophy. Left ventricular diastolic  function could not be evaluated. Left ventricular diastolic function could  not be evaluated.  2. Right ventricular systolic function is normal. The right ventricular  size is normal. There is normal pulmonary artery systolic pressure. The  estimated right ventricular systolic pressure is 82.9 mmHg.  3. The mitral valve appears rheumatic in the PLAX views, but likely just  degenerative. The mitral valve is degenerative. Mild to moderate mitral  valve regurgitation. Mild mitral stenosis. The mean mitral valve gradient  is 3.5 mmHg with average heart  rate of 70 bpm.  4. The aortic valve is tricuspid. Aortic valve regurgitation is mild.  Mild to moderate aortic valve sclerosis/calcification is present, without  any evidence of aortic stenosis.  5. The inferior vena cava is normal in size with greater than 50%  respiratory variability, suggesting right atrial pressure of 3 mmHg.   Comparison(s): A prior study was performed on 01/01/2019. EF now 45-50%. )       Anesthesia Quick Evaluation

## 2020-06-03 ENCOUNTER — Encounter (HOSPITAL_COMMUNITY): Payer: Self-pay | Admitting: Gastroenterology

## 2020-06-07 ENCOUNTER — Ambulatory Visit (HOSPITAL_COMMUNITY)
Admission: RE | Admit: 2020-06-07 | Discharge: 2020-06-07 | Disposition: A | Discharge status: Home / Self Care | Payer: Medicare HMO | Source: Ambulatory Visit | Attending: Nephrology | Admitting: Nephrology

## 2020-06-07 ENCOUNTER — Other Ambulatory Visit: Payer: Self-pay

## 2020-06-07 VITALS — BP 135/55 | HR 70 | Temp 97.5°F | Resp 20

## 2020-06-07 DIAGNOSIS — N183 Chronic kidney disease, stage 3 unspecified: Secondary | ICD-10-CM | POA: Diagnosis present

## 2020-06-07 LAB — POCT HEMOGLOBIN-HEMACUE: Hemoglobin: 11.2 g/dL — ABNORMAL LOW (ref 12.0–15.0)

## 2020-06-07 MED ORDER — DARBEPOETIN ALFA 100 MCG/0.5ML IJ SOSY: 100.0000 ug | PREFILLED_SYRINGE | INTRAMUSCULAR | Status: DC

## 2020-06-07 MED ORDER — DARBEPOETIN ALFA 100 MCG/0.5ML IJ SOSY
PREFILLED_SYRINGE | INTRAMUSCULAR | Status: AC
  Administered 2020-06-07: 100 ug via SUBCUTANEOUS
  Filled 2020-06-07: qty 0.5

## 2020-06-09 ENCOUNTER — Other Ambulatory Visit: Payer: Self-pay | Admitting: Gastroenterology

## 2020-06-13 ENCOUNTER — Other Ambulatory Visit (HOSPITAL_COMMUNITY): Payer: Self-pay

## 2020-06-14 ENCOUNTER — Encounter (HOSPITAL_COMMUNITY)
Admission: RE | Admit: 2020-06-14 | Discharge: 2020-06-14 | Disposition: A | Discharge status: Home / Self Care | Payer: Medicare HMO | Source: Ambulatory Visit | Attending: Nephrology | Admitting: Nephrology

## 2020-06-14 ENCOUNTER — Other Ambulatory Visit: Payer: Self-pay

## 2020-06-14 ENCOUNTER — Other Ambulatory Visit (HOSPITAL_COMMUNITY): Payer: Self-pay | Admitting: Physician Assistant

## 2020-06-14 DIAGNOSIS — D631 Anemia in chronic kidney disease: Secondary | ICD-10-CM | POA: Diagnosis not present

## 2020-06-14 DIAGNOSIS — D0461 Carcinoma in situ of skin of right upper limb, including shoulder: Secondary | ICD-10-CM | POA: Diagnosis not present

## 2020-06-14 DIAGNOSIS — L570 Actinic keratosis: Secondary | ICD-10-CM | POA: Diagnosis not present

## 2020-06-14 DIAGNOSIS — D0439 Carcinoma in situ of skin of other parts of face: Secondary | ICD-10-CM | POA: Diagnosis not present

## 2020-06-14 DIAGNOSIS — L858 Other specified epidermal thickening: Secondary | ICD-10-CM | POA: Diagnosis not present

## 2020-06-14 DIAGNOSIS — N183 Chronic kidney disease, stage 3 unspecified: Secondary | ICD-10-CM | POA: Diagnosis present

## 2020-06-14 DIAGNOSIS — R238 Other skin changes: Secondary | ICD-10-CM | POA: Diagnosis not present

## 2020-06-14 MED ORDER — SODIUM CHLORIDE 0.9 % IV SOLN
510.0000 mg | INTRAVENOUS | Status: DC
  Administered 2020-06-14: 510 mg via INTRAVENOUS
  Filled 2020-06-14: qty 17

## 2020-06-16 ENCOUNTER — Other Ambulatory Visit: Payer: Self-pay

## 2020-06-16 ENCOUNTER — Encounter (HOSPITAL_COMMUNITY): Payer: Self-pay | Admitting: Physician Assistant

## 2020-06-16 ENCOUNTER — Ambulatory Visit (HOSPITAL_COMMUNITY)
Admission: RE | Admit: 2020-06-16 | Discharge: 2020-06-16 | Disposition: A | Discharge status: Home / Self Care | Payer: Medicare HMO | Source: Ambulatory Visit | Attending: Physician Assistant | Admitting: Physician Assistant

## 2020-06-16 VITALS — BP 150/80 | HR 70 | Ht 65.0 in | Wt 150.6 lb

## 2020-06-16 DIAGNOSIS — I130 Hypertensive heart and chronic kidney disease with heart failure and stage 1 through stage 4 chronic kidney disease, or unspecified chronic kidney disease: Secondary | ICD-10-CM | POA: Diagnosis not present

## 2020-06-16 DIAGNOSIS — E1122 Type 2 diabetes mellitus with diabetic chronic kidney disease: Secondary | ICD-10-CM | POA: Diagnosis not present

## 2020-06-16 DIAGNOSIS — I428 Other cardiomyopathies: Secondary | ICD-10-CM | POA: Insufficient documentation

## 2020-06-16 DIAGNOSIS — D6869 Other thrombophilia: Secondary | ICD-10-CM

## 2020-06-16 DIAGNOSIS — K219 Gastro-esophageal reflux disease without esophagitis: Secondary | ICD-10-CM | POA: Diagnosis not present

## 2020-06-16 DIAGNOSIS — Z87891 Personal history of nicotine dependence: Secondary | ICD-10-CM | POA: Insufficient documentation

## 2020-06-16 DIAGNOSIS — B399 Histoplasmosis, unspecified: Secondary | ICD-10-CM | POA: Insufficient documentation

## 2020-06-16 DIAGNOSIS — I493 Ventricular premature depolarization: Secondary | ICD-10-CM | POA: Insufficient documentation

## 2020-06-16 DIAGNOSIS — E785 Hyperlipidemia, unspecified: Secondary | ICD-10-CM | POA: Insufficient documentation

## 2020-06-16 DIAGNOSIS — I491 Atrial premature depolarization: Secondary | ICD-10-CM | POA: Diagnosis not present

## 2020-06-16 DIAGNOSIS — Z8249 Family history of ischemic heart disease and other diseases of the circulatory system: Secondary | ICD-10-CM | POA: Insufficient documentation

## 2020-06-16 DIAGNOSIS — N189 Chronic kidney disease, unspecified: Secondary | ICD-10-CM | POA: Diagnosis not present

## 2020-06-16 DIAGNOSIS — I5042 Chronic combined systolic (congestive) and diastolic (congestive) heart failure: Secondary | ICD-10-CM | POA: Insufficient documentation

## 2020-06-16 DIAGNOSIS — Z79899 Other long term (current) drug therapy: Secondary | ICD-10-CM | POA: Diagnosis not present

## 2020-06-16 DIAGNOSIS — I480 Paroxysmal atrial fibrillation: Secondary | ICD-10-CM

## 2020-06-16 DIAGNOSIS — Z7901 Long term (current) use of anticoagulants: Secondary | ICD-10-CM | POA: Diagnosis not present

## 2020-06-16 LAB — COMPREHENSIVE METABOLIC PANEL
ALT: 48 U/L — ABNORMAL HIGH (ref 0–44)
AST: 33 U/L (ref 15–41)
Albumin: 3.5 g/dL (ref 3.5–5.0)
Alkaline Phosphatase: 72 U/L (ref 38–126)
Anion gap: 9 (ref 5–15)
BUN: 42 mg/dL — ABNORMAL HIGH (ref 8–23)
CO2: 24 mmol/L (ref 22–32)
Calcium: 9.6 mg/dL (ref 8.9–10.3)
Chloride: 100 mmol/L (ref 98–111)
Creatinine, Ser: 2.19 mg/dL — ABNORMAL HIGH (ref 0.44–1.00)
GFR calc Af Amer: 24 mL/min — ABNORMAL LOW (ref >60)
GFR calc non Af Amer: 20 mL/min — ABNORMAL LOW (ref >60)
Glucose, Bld: 118 mg/dL — ABNORMAL HIGH (ref 70–99)
Potassium: 5 mmol/L (ref 3.5–5.1)
Sodium: 133 mmol/L — ABNORMAL LOW (ref 135–145)
Total Bilirubin: 0.6 mg/dL (ref 0.3–1.2)
Total Protein: 6.6 g/dL (ref 6.5–8.1)

## 2020-06-16 LAB — TSH: TSH: 1.26 u[IU]/mL (ref 0.350–4.500)

## 2020-06-16 MED ORDER — AMIODARONE HCL 100 MG PO TABS: 100.0000 mg | ORAL_TABLET | Freq: Every day | ORAL | 6 refills | Status: DC

## 2020-06-16 NOTE — Progress Notes (Signed)
Primary Care Physician: Leighton Ruff, MD Primary Cardiologist: Dr Haroldine Laws Primary Electrophysiologist: Dr Curt Bears Referring Physician: Device Clinic   Krista Mason is a 84 y.o. female with a history of HTN, PVCs, HLD, DM2, CKD, histoplasmosis, tobacco abuse, chronic systolic CHF, and paroxysmal atrial fibrillation who presents for follow up in the [facility].  The patient was initially diagnosed with atrial fibrillation on device interrogation after undergoing pacemaker implant with Dr Curt Bears. She was prescribed amiodarone. Patient was hospitalized from 08/18/19-08/25/19 with a GI bleed and anemia. Patient diagnosed with severe esophagitis. Her Eliquis was resumed 08/27/19 for a CHADS2VASC score of 6. Of note, she was in SR during her admission.  On follow up today, patient reports that she has done well since her last visit. There have been no episodes of afib noted on her device. She does reports that over the last 4 months she has noticed a tremor in her head and hands. She denies bleeding issues on anticoagulation.   Today, she denies symptoms of palpitations, chest pain, orthopnea, PND, lower extremity edema, dizziness, presyncope, syncope, snoring, daytime somnolence, bleeding, or neurologic sequela.     Atrial Fibrillation Risk Factors:  she does not have symptoms or diagnosis of sleep apnea. she does not have a history of rheumatic fever.   she has a BMI of Body mass index is 25.06 kg/m.[name] Filed Weights   06/16/20 1511  Weight: 68.3 kg    Family History  Problem Relation Age of Onset   Heart attack Father    Heart disease Father    Cancer Father    Hypertension Father    Hypertension Brother    Stroke Brother    Hypertension Brother    Diabetes Son    Hyperlipidemia Son    Hypertension Son      Atrial Fibrillation Management history:  Previous antiarrhythmic drugs: amiodarone Previous cardioversions: none Previous  ablations: PVC ablation 2018 CHADS2VASC score: 6 Anticoagulation history: Eliquis   Past Medical History:  Diagnosis Date   Anemia    Atrial fibrillation (HCC)    Chronic combined systolic and diastolic CHF (congestive heart failure) ([place])    a. LV dysfcuntion felt to be related to PVCs   Chronic kidney disease    Diabetes mellitus without complication (HCC)    GERD (gastroesophageal reflux disease)    Hx of echocardiogram    Echo (1/16):  EF 60-65%, no RWMA, Gr 1 DD, mod AI, mod MR, mild LAE   Hyperkalemia    Hyperlipidemia    Hypertension    NICM (nonischemic cardiomyopathy) ([place])    a. 12/2018 Echo: EF 35%, diff HK w/ septal-lateral dyssynchrony. Nl RV fxn; b. 07/2019 s/p MDT [id number] [name] CRT-P MRI SureScan device (ser #: [id number])   Orthostatic hypotension    PVC (premature ventricular contraction)    a. s/p PVC ablation in 11/2016   Rheumatic fever    Wears dentures    Past Surgical History:  Procedure Laterality Date   BIOPSY  08/20/2019   Procedure: BIOPSY;  Surgeon: Otis Brace, MD;  Location: [facility];  Service: Gastroenterology;;   BIV PACEMAKER INSERTION CRT-P N/A 07/24/2019   Procedure: BIV PACEMAKER INSERTION CRT-P;  Surgeon: Constance Haw, MD;  Location: [facility];  Service: Cardiovascular;  Laterality: N/A;   COLONOSCOPY WITH PROPOFOL N/A 08/22/2019   Procedure: COLONOSCOPY WITH PROPOFOL;  Surgeon: Laurence Spates, MD;  Location: [facility];  Service: Endoscopy;  Laterality: N/A;   ESOPHAGOGASTRODUODENOSCOPY (EGD) WITH  PROPOFOL N/A 09/25/2018   Procedure: ESOPHAGOGASTRODUODENOSCOPY (EGD) WITH PROPOFOL;  Surgeon: Ronald Lobo, MD;  Location: [facility];  Service: Endoscopy;  Laterality: N/A;   ESOPHAGOGASTRODUODENOSCOPY (EGD) WITH PROPOFOL N/A 10/29/2018   Procedure: ESOPHAGOGASTRODUODENOSCOPY (EGD) WITH PROPOFOL;  Surgeon: Ronald Lobo, MD;  Location: [facility];  Service: Endoscopy;  Laterality: N/A;    ESOPHAGOGASTRODUODENOSCOPY (EGD) WITH PROPOFOL N/A 08/20/2019   Procedure: ESOPHAGOGASTRODUODENOSCOPY (EGD) WITH PROPOFOL;  Surgeon: Otis Brace, MD;  Location: [facility];  Service: Gastroenterology;  Laterality: N/A;   ESOPHAGOGASTRODUODENOSCOPY (EGD) WITH PROPOFOL N/A 05/03/2020   Procedure: ESOPHAGOGASTRODUODENOSCOPY (EGD) WITH PROPOFOL;  Surgeon: Ronald Lobo, MD;  Location: [facility];  Service: Endoscopy;  Laterality: N/A;   ESOPHAGOGASTRODUODENOSCOPY (EGD) WITH PROPOFOL N/A 06/02/2020   Procedure: ESOPHAGOGASTRODUODENOSCOPY (EGD) WITH PROPOFOL and Savory Dilatation;  Surgeon: Clarene Essex, MD;  Location: [facility];  Service: Gastroenterology;  Laterality: N/A;   HEMOSTASIS CLIP PLACEMENT  08/22/2019   Procedure: HEMOSTASIS CLIP PLACEMENT;  Surgeon: Laurence Spates, MD;  Location: [facility];  Service: Endoscopy;;   KNEE SURGERY Left 2009   OVARIAN CYST REMOVAL     POLYPECTOMY  08/22/2019   Procedure: POLYPECTOMY;  Surgeon: Laurence Spates, MD;  Location: [place];  Service: Endoscopy;;   PVC ABLATION N/A 11/30/2016   Procedure: PVC Ablation;  Surgeon: Will [name], MD;  Location: [facility];  Service: Cardiovascular;  Laterality: N/A;   RIGHT HEART CATH N/A 01/31/2018   Procedure: RIGHT HEART CATH;  Surgeon: Jolaine Artist, MD;  Location: [place] [facility];  Service: Cardiovascular;  Laterality: N/A;   RIGHT/LEFT HEART CATH AND CORONARY ANGIOGRAPHY N/A 01/25/2017   Procedure: Right/Left Heart Cath and Coronary Angiography;  Surgeon: Jolaine Artist, MD;  Location: [facility];  Service: Cardiovascular;  Laterality: N/A;   SAVORY DILATION N/A 09/25/2018   Procedure: SAVORY DILATION;  Surgeon: Ronald Lobo, MD;  Location: [facility];  Service: Endoscopy;  Laterality: N/A;   SAVORY DILATION N/A 10/29/2018   Procedure: SAVORY DILATION;  Surgeon: Ronald Lobo, MD;  Location: [facility];  Service: Endoscopy;  Laterality: N/A;    SAVORY DILATION N/A 08/20/2019   Procedure: SAVORY DILATION;  Surgeon: Otis Brace, MD;  Location: [facility];  Service: Gastroenterology;  Laterality: N/A;   SAVORY DILATION N/A 05/03/2020   Procedure: SAVORY DILATION;  Surgeon: Ronald Lobo, MD;  Location: [facility];  Service: Endoscopy;  Laterality: N/A;  Patient needs ultraslim pediatric upper endoscope/     no fluoro per dr. Cristal Deer DILATION N/A 06/02/2020   Procedure: SAVORY DILATION w/ Burtis Junes;  Surgeon: Clarene Essex, MD;  Location: [facility];  Service: Gastroenterology;  Laterality: N/A;    Current Outpatient Medications  Medication Sig Dispense Refill   acetaminophen (TYLENOL) 500 MG tablet Take 500 mg by mouth every 6 (six) hours as needed for moderate pain or headache.     amiodarone (PACERONE) 100 MG tablet Take 1 tablet (100 mg total) by mouth daily. 30 tablet 6   atorvastatin (LIPITOR) 20 MG tablet TAKE 1 TABLET BY MOUTH EVERY DAY 90 tablet 3   Camphor-Eucalyptus-Menthol (VICKS VAPORUB EX) Apply 1 application topically daily as needed (toe fungus).     carvedilol (COREG) 12.5 MG tablet Take 1 tablet (12.5 mg total) by mouth 2 (two) times daily with a meal. 60 tablet 11   cholecalciferol (VITAMIN D3) 25 MCG (1000 UNIT) tablet Take 1,000 Units by mouth daily.      diphenhydrAMINE (BENADRYL) 25 mg capsule Take 25 mg by mouth every  6 (six) hours as needed for allergies.      ELIQUIS 2.5 MG TABS tablet TAKE 1 TABLET BY MOUTH TWICE A DAY 60 tablet 6   feeding supplement, ENSURE ENLIVE, (ENSURE ENLIVE) LIQD Take 237 mLs by mouth 2 (two) times daily between meals. (Patient taking differently: Take 237 mLs by mouth 3 (three) times daily. ) 237 mL 12   furosemide (LASIX) 20 MG tablet Take 20-40 mg by mouth See admin instructions. Alternate taking 20 mg one day and 40 mg the next     furosemide (LASIX) 40 MG tablet Take 1 tablet (40 mg total) by mouth in the morning AND 0.5 tablets (20 mg total) every  evening. 45 tablet 3   hydrALAZINE (APRESOLINE) 50 MG tablet TAKE 1 TABLET BY MOUTH THREE TIMES A DAY 270 tablet 2   Imiquimod 2.5 % CREA Apply topically.     lidocaine (XYLOCAINE) 2 % solution SMARTSIG:By Mouth     pantoprazole (PROTONIX) 40 MG tablet Take 1 tablet (40 mg total) by mouth 2 (two) times daily before a meal. 60 tablet 1   Polyethyl Glycol-Propyl Glycol (SYSTANE OP) Place 1 drop into both eyes 2 (two) times daily as needed (dryness).     vitamin B-12 (CYANOCOBALAMIN) 1000 MCG tablet Take 1 tablet (1,000 mcg total) by mouth daily. 30 tablet 0   No current facility-administered medications for this encounter.    Allergies  Allergen Reactions   Penicillins Itching and Rash    Amoxicillin ok- IM pen is what gives reaction  Did it involve swelling of the face/tongue/throat, SOB, or low BP? No Did it involve sudden or severe rash/hives, skin peeling, or any reaction on the inside of your mouth or nose? No Did you need to seek medical attention at a hospital or doctor's office? No When did it last happen?10 + year If all above answers are NO, may proceed with cephalosporin use.    Retacrit [Epoetin (Alfa)] Other (See Comments)    Chills, low grade fever, body aches, fatigue, nausea for 3 days.   Ceclor [Cefaclor] Itching   Crestor [Rosuvastatin Calcium] Nausea Only   Influenza Vaccines     Swelling and redness at injection site, fever   Nsaids     Pt states she is not taking because of kidney function   Percocet [Oxycodone-Acetaminophen] Nausea And Vomiting   Prednisone     Dizziness, spiked blood sugar    Social History   Socioeconomic History   Marital status: Widowed    Spouse name: Not on file   Number of children: Not on file   Years of education: Not on file   Highest education level: Not on file  Occupational History   Not on file  Tobacco Use   Smoking status: Former Smoker    Packs/day: 0.75    Years: 60.00    Pack years:  45.00    Quit date: 11/04/2016    Years since quitting: 3.6   Smokeless tobacco: Never Used  Vaping Use   Vaping Use: Never used  Substance and Sexual Activity   Alcohol use: No   Drug use: No   Sexual activity: Not on file  Other Topics Concern   Not on file  Social History Narrative   Lives at [facility].   Social Determinants of Health   Financial Resource Strain:    Difficulty of Paying Living Expenses: Not on file  Food Insecurity:    Worried About [place] in the Last  Year: Not on file   Ran Out of Food in the Last Year: Not on file  Transportation Needs:    Lack of Transportation (Medical): Not on file   Lack of Transportation (Non-Medical): Not on file  Physical Activity:    Days of Exercise per Week: Not on file   Minutes of Exercise per Session: Not on file  Stress:    Feeling of Stress : Not on file  Social Connections:    Frequency of Communication with Friends and Family: Not on file   Frequency of Social Gatherings with Friends and Family: Not on file   Attends Religious Services: Not on file   Active Member of Clubs or Organizations: Not on file   Attends Archivist Meetings: Not on file   Marital Status: Not on file  Intimate Partner Violence:    Fear of Current or Ex-Partner: Not on file   Emotionally Abused: Not on file   Physically Abused: Not on file   Sexually Abused: Not on file     ROS- All systems are reviewed and negative except as per the HPI above.  Physical Exam: Vitals:   06/16/20 1511  BP: (!) 150/80  Pulse: 70  Weight: 68.3 kg  Height: 5\' 5"  (1.651 m)    GEN- The patient is well appearing elderly female, alert and oriented x 3 today.   HEENT-head normocephalic, atraumatic, sclera clear, conjunctiva pink, hearing intact, trachea midline. Lungs- Clear to ausculation bilaterally, normal work of breathing Heart- Regular rate and rhythm, no murmurs, rubs or gallops  GI-  soft, NT, ND, + BS Extremities- no clubbing, cyanosis, or edema MS- no significant deformity or atrophy Skin- no rash or lesion Psych- euthymic mood, full affect Neuro- strength and sensation are intact   Wt Readings from Last 3 Encounters:  06/16/20 68.3 kg  05/31/20 66.7 kg  05/03/20 67.1 kg    EKG today demonstrates AV dual paced rhythm HR 70, RBBB, PR 168, QRS 148, QTc 501  Echo 01/01/19 demonstrated  1. The left ventricle has a visually estimated ejection fraction of 35%. The cavity size was normal. There is mildly increased left ventricular wall thickness. Left ventricular diastolic Doppler parameters are consistent with impaired relaxation.  Diffuse hypokinesis with septal-lateral dyssynchrony.  2. The right ventricle has normal systolic function. The cavity was normal. There is no increase in right ventricular wall thickness.  3. Left atrial size was mildly dilated.  4. Trivial pericardial effusion is present.  5. Mild calcification of the mitral valve leaflet. Mitral valve regurgitation is mild to moderate by color flow Doppler. No evidence of mitral valve stenosis.  6. The aortic valve is tricuspid Mild calcification of the aortic valve. Aortic valve regurgitation is mild by color flow Doppler. no stenosis of the aortic valve.  7. The aortic root and ascending aorta are normal in size and structure.  8. Normal IVC size with PA systolic pressure 21 mmHg.  Epic records are reviewed at length today  Assessment and Plan:  1. Paroxysmal atrial fibrillation 0% AF burden on device. Continue Eliquis 2.5 mg BID (age, Cr >1.5). Will decrease amiodarone to 100 mg daily to see if her tremors improve.  Check cmet/TSH today.  This patients CHA2DS2-VASc Score and unadjusted Ischemic Stroke Rate (% per year) is equal to 9.7 % stroke rate/year from a score of 6  Above score calculated as 1 point each if present [CHF, HTN, DM, Vascular=MI/PAD/Aortic Plaque, Age if 65-74, or  Female] Above score calculated  as 2 points each if present [Age > 75, or Stroke/TIA/TE]  2. HTN Improved, no changes today.  3. NICM S/p CRT-P followed by Dr Curt Bears, Dr Haroldine Laws, and the device clinic. No signs or symptoms of fluid overload.   4. PVC S/p ablation in 2018   Follow up with Dr Curt Bears and Dr Haroldine Laws per recall. AF clinic in 6 months.    [facility] [address] [place], [place] [address] [phone] 06/16/2020 3:56 PM

## 2020-06-16 NOTE — Patient Instructions (Signed)
Decrease amiodarone to 100mg  once a day  We will see you in 6 months

## 2020-06-18 ENCOUNTER — Other Ambulatory Visit (HOSPITAL_COMMUNITY): Payer: Self-pay | Admitting: Physician Assistant

## 2020-06-18 DIAGNOSIS — I480 Paroxysmal atrial fibrillation: Secondary | ICD-10-CM

## 2020-06-18 DIAGNOSIS — I491 Atrial premature depolarization: Secondary | ICD-10-CM

## 2020-06-21 ENCOUNTER — Other Ambulatory Visit: Payer: Self-pay

## 2020-06-21 ENCOUNTER — Encounter (HOSPITAL_COMMUNITY)
Admission: RE | Admit: 2020-06-21 | Discharge: 2020-06-21 | Disposition: A | Discharge status: Home / Self Care | Payer: Medicare HMO | Source: Ambulatory Visit | Attending: Nephrology | Admitting: Nephrology

## 2020-06-21 DIAGNOSIS — N183 Chronic kidney disease, stage 3 unspecified: Secondary | ICD-10-CM | POA: Diagnosis not present

## 2020-06-21 MED ORDER — SODIUM CHLORIDE 0.9 % IV SOLN
510.0000 mg | INTRAVENOUS | Status: AC
  Administered 2020-06-21: 510 mg via INTRAVENOUS
  Filled 2020-06-21: qty 17

## 2020-06-28 ENCOUNTER — Other Ambulatory Visit: Payer: Self-pay

## 2020-06-28 ENCOUNTER — Ambulatory Visit (HOSPITAL_COMMUNITY)
Admission: RE | Admit: 2020-06-28 | Discharge: 2020-06-28 | Disposition: A | Discharge status: Home / Self Care | Payer: Medicare HMO | Source: Ambulatory Visit | Attending: Nephrology | Admitting: Nephrology

## 2020-06-28 VITALS — BP 167/66 | HR 69 | Temp 97.4°F | Resp 20

## 2020-06-28 DIAGNOSIS — N183 Chronic kidney disease, stage 3 unspecified: Secondary | ICD-10-CM

## 2020-06-28 DIAGNOSIS — D631 Anemia in chronic kidney disease: Secondary | ICD-10-CM | POA: Insufficient documentation

## 2020-06-28 LAB — IRON AND TIBC
Iron: 102 ug/dL (ref 28–170)
Saturation Ratios: 29 % (ref 10.4–31.8)
TIBC: 353 ug/dL (ref 250–450)
UIBC: 251 ug/dL

## 2020-06-28 LAB — POCT HEMOGLOBIN-HEMACUE: Hemoglobin: 12.2 g/dL (ref 12.0–15.0)

## 2020-06-28 LAB — FERRITIN: Ferritin: 1074 ng/mL — ABNORMAL HIGH (ref 11–307)

## 2020-06-28 MED ORDER — DARBEPOETIN ALFA 100 MCG/0.5ML IJ SOSY: 100.0000 ug | PREFILLED_SYRINGE | INTRAMUSCULAR | Status: DC

## 2020-06-29 ENCOUNTER — Other Ambulatory Visit: Payer: Self-pay

## 2020-07-05 ENCOUNTER — Encounter (HOSPITAL_COMMUNITY): Payer: Medicare HMO

## 2020-07-06 NOTE — Progress Notes (Signed)
This pt was scheduled one day too early for her covid test for her procedure on Tues 07/12/20. When the pt was contacted she stated that Freda Munro from Dr. Osborn Coho office said it was ok for her to be tested on Thurs 9/16 instead of Fri 9/17 because she will not have transportation. LVM at Dr. Osborn Coho office for Freda Munro to contact the anesthesia department to make them aware of this arrangement so that there is no confusion on the day of the procedure.

## 2020-07-07 ENCOUNTER — Other Ambulatory Visit (HOSPITAL_COMMUNITY)
Admission: RE | Admit: 2020-07-07 | Discharge: 2020-07-07 | Disposition: A | Discharge status: Home / Self Care | Payer: Medicare HMO | Source: Ambulatory Visit | Attending: Gastroenterology | Admitting: Gastroenterology

## 2020-07-07 DIAGNOSIS — Z01812 Encounter for preprocedural laboratory examination: Secondary | ICD-10-CM | POA: Diagnosis not present

## 2020-07-07 DIAGNOSIS — Z20822 Contact with and (suspected) exposure to covid-19: Secondary | ICD-10-CM | POA: Insufficient documentation

## 2020-07-07 LAB — SARS CORONAVIRUS 2 (TAT 6-24 HRS): SARS Coronavirus 2: NEGATIVE

## 2020-07-09 ENCOUNTER — Other Ambulatory Visit: Payer: Self-pay | Admitting: Student

## 2020-07-09 DIAGNOSIS — I480 Paroxysmal atrial fibrillation: Secondary | ICD-10-CM

## 2020-07-09 DIAGNOSIS — I491 Atrial premature depolarization: Secondary | ICD-10-CM

## 2020-07-12 ENCOUNTER — Encounter (HOSPITAL_COMMUNITY)
Admission: RE | Disposition: A | Discharge status: Home / Self Care | Payer: Self-pay | Source: Home / Self Care | Attending: Gastroenterology

## 2020-07-12 ENCOUNTER — Ambulatory Visit (HOSPITAL_COMMUNITY): Payer: Medicare HMO | Admitting: Certified Registered Nurse Anesthetist

## 2020-07-12 ENCOUNTER — Encounter (HOSPITAL_COMMUNITY): Payer: Self-pay | Admitting: Gastroenterology

## 2020-07-12 ENCOUNTER — Other Ambulatory Visit: Payer: Self-pay

## 2020-07-12 ENCOUNTER — Ambulatory Visit (HOSPITAL_COMMUNITY)
Admission: RE | Admit: 2020-07-12 | Discharge: 2020-07-12 | Disposition: A | Discharge status: Home / Self Care | Payer: Medicare HMO | Attending: Gastroenterology | Admitting: Gastroenterology

## 2020-07-12 DIAGNOSIS — E785 Hyperlipidemia, unspecified: Secondary | ICD-10-CM | POA: Diagnosis not present

## 2020-07-12 DIAGNOSIS — I5042 Chronic combined systolic (congestive) and diastolic (congestive) heart failure: Secondary | ICD-10-CM | POA: Insufficient documentation

## 2020-07-12 DIAGNOSIS — I4891 Unspecified atrial fibrillation: Secondary | ICD-10-CM | POA: Diagnosis not present

## 2020-07-12 DIAGNOSIS — K219 Gastro-esophageal reflux disease without esophagitis: Secondary | ICD-10-CM | POA: Diagnosis not present

## 2020-07-12 DIAGNOSIS — I428 Other cardiomyopathies: Secondary | ICD-10-CM | POA: Diagnosis not present

## 2020-07-12 DIAGNOSIS — K222 Esophageal obstruction: Secondary | ICD-10-CM | POA: Insufficient documentation

## 2020-07-12 DIAGNOSIS — Z79899 Other long term (current) drug therapy: Secondary | ICD-10-CM | POA: Insufficient documentation

## 2020-07-12 DIAGNOSIS — Z791 Long term (current) use of non-steroidal anti-inflammatories (NSAID): Secondary | ICD-10-CM | POA: Insufficient documentation

## 2020-07-12 DIAGNOSIS — Z950 Presence of cardiac pacemaker: Secondary | ICD-10-CM | POA: Diagnosis not present

## 2020-07-12 DIAGNOSIS — E1122 Type 2 diabetes mellitus with diabetic chronic kidney disease: Secondary | ICD-10-CM | POA: Insufficient documentation

## 2020-07-12 DIAGNOSIS — K317 Polyp of stomach and duodenum: Secondary | ICD-10-CM | POA: Insufficient documentation

## 2020-07-12 DIAGNOSIS — N189 Chronic kidney disease, unspecified: Secondary | ICD-10-CM | POA: Diagnosis not present

## 2020-07-12 DIAGNOSIS — J449 Chronic obstructive pulmonary disease, unspecified: Secondary | ICD-10-CM | POA: Insufficient documentation

## 2020-07-12 DIAGNOSIS — K2210 Ulcer of esophagus without bleeding: Secondary | ICD-10-CM | POA: Insufficient documentation

## 2020-07-12 DIAGNOSIS — I130 Hypertensive heart and chronic kidney disease with heart failure and stage 1 through stage 4 chronic kidney disease, or unspecified chronic kidney disease: Secondary | ICD-10-CM | POA: Insufficient documentation

## 2020-07-12 DIAGNOSIS — Z87891 Personal history of nicotine dependence: Secondary | ICD-10-CM | POA: Insufficient documentation

## 2020-07-12 DIAGNOSIS — I5022 Chronic systolic (congestive) heart failure: Secondary | ICD-10-CM | POA: Diagnosis not present

## 2020-07-12 DIAGNOSIS — E1151 Type 2 diabetes mellitus with diabetic peripheral angiopathy without gangrene: Secondary | ICD-10-CM | POA: Insufficient documentation

## 2020-07-12 DIAGNOSIS — R1314 Dysphagia, pharyngoesophageal phase: Secondary | ICD-10-CM | POA: Insufficient documentation

## 2020-07-12 DIAGNOSIS — N183 Chronic kidney disease, stage 3 unspecified: Secondary | ICD-10-CM | POA: Diagnosis not present

## 2020-07-12 DIAGNOSIS — K449 Diaphragmatic hernia without obstruction or gangrene: Secondary | ICD-10-CM | POA: Diagnosis not present

## 2020-07-12 HISTORY — PX: BALLOON DILATION: SHX5330

## 2020-07-12 HISTORY — PX: ESOPHAGOGASTRODUODENOSCOPY (EGD) WITH PROPOFOL: SHX5813

## 2020-07-12 SURGERY — ESOPHAGOGASTRODUODENOSCOPY (EGD) WITH PROPOFOL
Anesthesia: Monitor Anesthesia Care

## 2020-07-12 MED ORDER — PROPOFOL 500 MG/50ML IV EMUL
INTRAVENOUS | Status: DC | PRN
  Administered 2020-07-12: 75 ug/kg/min via INTRAVENOUS

## 2020-07-12 MED ORDER — ONDANSETRON HCL 4 MG/2ML IJ SOLN
INTRAMUSCULAR | Status: DC | PRN
  Administered 2020-07-12: 4 mg via INTRAVENOUS

## 2020-07-12 MED ORDER — LIDOCAINE 2% (20 MG/ML) 5 ML SYRINGE
INTRAMUSCULAR | Status: DC | PRN
  Administered 2020-07-12: 60 mg via INTRAVENOUS

## 2020-07-12 MED ORDER — PROPOFOL 10 MG/ML IV BOLUS
INTRAVENOUS | Status: DC | PRN
  Administered 2020-07-12 (×3): 20 mg via INTRAVENOUS
  Administered 2020-07-12: 10 mg via INTRAVENOUS

## 2020-07-12 MED ORDER — SODIUM CHLORIDE 0.9 % IV SOLN
INTRAVENOUS | Status: DC
  Administered 2020-07-12: 500 mL via INTRAVENOUS

## 2020-07-12 SURGICAL SUPPLY — 14 items

## 2020-07-12 NOTE — Transfer of Care (Signed)
Immediate Anesthesia Transfer of Care Note  Patient: Emyah C Ciulla  Procedure(s) Performed: ESOPHAGOGASTRODUODENOSCOPY (EGD) WITH PROPOFOL with dilatation (N/A ) BALLOON DILATION (N/A )  Patient Location: PACU and Endoscopy Unit  Anesthesia Type:MAC  Level of Consciousness: awake, alert , oriented and patient cooperative  Airway & Oxygen Therapy: Patient Spontanous Breathing and Patient connected to face mask oxygen  Post-op Assessment: Report given to RN and Post -op Vital signs reviewed and stable  Post vital signs: Reviewed and stable  Last Vitals:  Vitals Value Taken Time  BP 124/45 07/12/20 1347  Temp    Pulse 70 07/12/20 1350  Resp 15 07/12/20 1350  SpO2 100 % 07/12/20 1350  Vitals shown include unvalidated device data.  Last Pain:  Vitals:   07/12/20 1347  TempSrc:   PainSc: 0-No pain         Complications: No complications documented.

## 2020-07-12 NOTE — Discharge Instructions (Signed)

## 2020-07-12 NOTE — Anesthesia Procedure Notes (Addendum)
Procedure Name: MAC Date/Time: 07/12/2020 1:15 PM Performed by: West Pugh, CRNA Pre-anesthesia Checklist: Patient identified, Emergency Drugs available, Suction available, Patient being monitored and Timeout performed Patient Re-evaluated:Patient Re-evaluated prior to induction Oxygen Delivery Method: Simple face mask Preoxygenation: Pre-oxygenation with 100% oxygen Induction Type: IV induction Placement Confirmation: positive ETCO2 Dental Injury: Teeth and Oropharynx as per pre-operative assessment

## 2020-07-12 NOTE — Anesthesia Preprocedure Evaluation (Signed)
Anesthesia Evaluation  Patient identified by MRN, date of birth, ID band Patient awake    Reviewed: Allergy & Precautions, NPO status , Patient's Chart, lab work & pertinent test results, reviewed documented beta blocker date and time   History of Anesthesia Complications Negative for: history of anesthetic complications  Airway Mallampati: II  TM Distance: >3 FB Neck ROM: Full    Dental  (+) Edentulous Lower, Edentulous Upper   Pulmonary COPD, former smoker,    Pulmonary exam normal        Cardiovascular hypertension, Pt. on medications and Pt. on home beta blockers + Peripheral Vascular Disease and +CHF  Normal cardiovascular exam+ dysrhythmias Atrial Fibrillation  Rhythm:Regular  Echo 12/2019 1. Left ventricular ejection fraction, by estimation, is 45 to 50%. The left ventricle has mildly decreased function. The left ventricle demonstrates global hypokinesis. There is mild concentric left ventricular hypertrophy. Left ventricular diastolic function could not be evaluated. Left ventricular diastolic function could not be evaluated.  2. Right ventricular systolic function is normal. The right ventricular size is normal. There is normal pulmonary artery systolic pressure. The estimated right ventricular systolic pressure is 48.5 mmHg.  3. The mitral valve appears rheumatic in the PLAX views, but likely just degenerative. The mitral valve is degenerative. Mild to moderate mitral valve regurgitation. Mild mitral stenosis. The mean mitral valve gradient  is 3.5 mmHg with average heart rate of 70 bpm.  4. The aortic valve is tricuspid. Aortic valve regurgitation is mild. Mild to moderate aortic valve sclerosis/calcification is present, without any evidence of aortic stenosis.  5. The inferior vena cava is normal in size with greater than 50% respiratory variability, suggesting right atrial pressure of 3 mmHg.   Neuro/Psych negative  neurological ROS  negative psych ROS   GI/Hepatic Neg liver ROS, GERD  Medicated and Poorly Controlled,  Endo/Other  diabetes, Type 2  Renal/GU Renal Insufficiency and CRFRenal disease     Musculoskeletal negative musculoskeletal ROS (+)   Abdominal   Peds  Hematology   Anesthesia Other Findings   Reproductive/Obstetrics                            Anesthesia Physical  Anesthesia Plan  ASA: III  Anesthesia Plan: MAC   Post-op Pain Management:    Induction: Intravenous  PONV Risk Score and Plan: 2 and Propofol infusion and Treatment may vary due to age or medical condition  Airway Management Planned: Natural Airway and Simple Face Mask  Additional Equipment: None  Intra-op Plan:   Post-operative Plan:   Informed Consent: I have reviewed the patients History and Physical, chart, labs and discussed the procedure including the risks, benefits and alternatives for the proposed anesthesia with the patient or authorized representative who has indicated his/her understanding and acceptance.     Dental advisory given  Plan Discussed with: CRNA  Anesthesia Plan Comments: (EKG:  Atrial paced.   Echo:  1. Left ventricular ejection fraction, by estimation, is 45 to 50%. The  left ventricle has mildly decreased function. The left ventricle  demonstrates global hypokinesis. There is mild concentric left ventricular  hypertrophy. Left ventricular diastolic  function could not be evaluated. Left ventricular diastolic function could  not be evaluated.  2. Right ventricular systolic function is normal. The right ventricular  size is normal. There is normal pulmonary artery systolic pressure. The  estimated right ventricular systolic pressure is 46.2 mmHg.  3. The mitral valve appears rheumatic in  the PLAX views, but likely just  degenerative. The mitral valve is degenerative. Mild to moderate mitral  valve regurgitation. Mild mitral stenosis.  The mean mitral valve gradient  is 3.5 mmHg with average heart  rate of 70 bpm.  4. The aortic valve is tricuspid. Aortic valve regurgitation is mild.  Mild to moderate aortic valve sclerosis/calcification is present, without  any evidence of aortic stenosis.  5. The inferior vena cava is normal in size with greater than 50%  respiratory variability, suggesting right atrial pressure of 3 mmHg.   Comparison(s): A prior study was performed on 01/01/2019. EF now 45-50%. )        Anesthesia Quick Evaluation

## 2020-07-12 NOTE — Op Note (Signed)
[facility] Patient Name: Krista Mason Procedure Date: 07/12/2020 MRN: [id number] Attending MD: Ronald Lobo , MD Date of Birth: 02/03/1936 CSN: [id number] Age: 84 Admit Type: Outpatient Procedure:                Upper GI endoscopy Indications:              Esophageal dysphagia, For therapy of esophageal                            stricture Providers:                Ronald Lobo, MD, Wynonia Sours, RN, Elspeth Cho Tech., Technician, Christell Faith, CRNA Referring MD:              Medicines:                Monitored Anesthesia Care Complications:            No immediate complications. Estimated Blood Loss:     Estimated blood loss was minimal. Procedure:                Pre-Anesthesia Assessment:                           - Prior to the procedure, a History and Physical                            was performed, and patient medications and                            allergies were reviewed. The patient's tolerance of                            previous anesthesia was also reviewed. The risks                            and benefits of the procedure and the sedation                            options and risks were discussed with the patient.                            All questions were answered, and informed consent                            was obtained. Prior Anticoagulants: The patient has                            taken Eliquis (apixaban), last dose was 2 days                            prior to procedure. ASA Grade Assessment: III - A  patient with severe systemic disease. After                            reviewing the risks and benefits, the patient was                            deemed in satisfactory condition to undergo the                            procedure.                           After obtaining informed consent, the endoscope was                            passed under direct vision. Throughout  the                            procedure, the patient's blood pressure, pulse, and                            oxygen saturations were monitored continuously. The                            [id number]-H190 ([id number]) Olympus gastroscope was                            introduced through the mouth, and advanced to the                            second part of duodenum. The upper GI endoscopy was                            accomplished without difficulty. The patient                            tolerated the procedure well. Scope In: Scope Out: Findings:      Moderately severe ersoive distal esophagitis was found. No deep       ulcerations.      Benign-appearing, intrinsic moderate distal esophageal stenosis was       found. The stenosis was traversed with the scope prior to       dilatation,with a slight "pop" as the scope was passed through the GE       junction. It appeared that the tightest part of the stenosis was at the       level of the squamo-columnar junction where a Schatzki's ring was       present, although there was also a suggestion of mild-to-moderate distal       tapering of the esophagus. A TTS dilator was passed through the scope.       Dilation with a 08-02-11 mm balloon dilator and a 12-13.5-15 mm balloon       was performed to a maximum diameter of 15 mm. After each inflation, the       mucosa of the distal esophagus was inspected and there was no evidence  of deep or severe mucosal disruption, including at the conclusion of the       procedure.      A single medium semi-sessile friable polyp was found in the gastric       fundus.      A 2 cm hiatal hernia was present.      The exam of the stomach was otherwise normal.      The examined duodenum was normal. Impression:               - Moderately severe esophagitis.                           - Benign-appearing esophageal stenosis. Dilated.                           - A single gastric polyp.                           - 2  cm hiatal hernia.                           - Normal examined duodenum.                           - No specimens collected. Moderate Sedation:      This patient was sedated with monitored anesthesia care, not moderate       sedation. Recommendation:           - Continue present medications.                           - Observe patient's clinical course following                            today's procedure with therapeutic intervention. Procedure Code(s):        --- Professional ---                           [phone], Esophagogastroduodenoscopy, flexible,                            transoral; with transendoscopic balloon dilation of                            esophagus (less than 30 mm diameter) Diagnosis Code(s):        --- Professional ---                           K20.90, Esophagitis, unspecified without bleeding                           K22.2, Esophageal obstruction                           R13.14, Dysphagia, pharyngoesophageal phase CPT copyright 2019 [employer] Medical Association. All rights reserved. The codes documented in this report are preliminary and upon coder review may  be revised to meet current compliance requirements. Ronald Lobo, MD 07/12/2020 2:02:27 PM This report  has been signed electronically. Number of Addenda: 0

## 2020-07-12 NOTE — H&P (Signed)
Krista Mason is an 84 y.o. female.   Chief Complaint: Dysphagia  HPI: 84 year old female with longstanding dysphagia symptoms and need for recurrent esophageal dilatations, most recently dilated to 12 mm by through-the-scope balloon technique approximately 6 weeks ago, now with residual dysphagia symptoms. She is on twice daily pantoprazole and, by eating a soft diet, she has been relatively free of dysphagia symptoms since I last saw her in the office about a month ago. She has had only one episode where things got stuck and she burped and drank some fluid and it finally went down.  Past Medical History:  Diagnosis Date   Anemia    Atrial fibrillation (HCC)    Chronic combined systolic and diastolic CHF (congestive heart failure) ([place])    a. LV dysfcuntion felt to be related to PVCs   Chronic kidney disease    Diabetes mellitus without complication (HCC)    GERD (gastroesophageal reflux disease)    Hx of echocardiogram    Echo (1/16):  EF 60-65%, no RWMA, Gr 1 DD, mod AI, mod MR, mild LAE   Hyperkalemia    Hyperlipidemia    Hypertension    NICM (nonischemic cardiomyopathy) ([place])    a. 12/2018 Echo: EF 35%, diff HK w/ septal-lateral dyssynchrony. Nl RV fxn; b. 07/2019 s/p MDT [id number] [name] CRT-P MRI SureScan device (ser #: [id number])   Orthostatic hypotension    PVC (premature ventricular contraction)    a. s/p PVC ablation in 11/2016   Rheumatic fever    Wears dentures     Past Surgical History:  Procedure Laterality Date   BIOPSY  08/20/2019   Procedure: BIOPSY;  Surgeon: Otis Brace, MD;  Location: [place] [facility];  Service: Gastroenterology;;   BIV PACEMAKER INSERTION CRT-P N/A 07/24/2019   Procedure: BIV PACEMAKER INSERTION CRT-P;  Surgeon: Constance Haw, MD;  Location: [place] [facility];  Service: Cardiovascular;  Laterality: N/A;   COLONOSCOPY WITH PROPOFOL N/A 08/22/2019   Procedure: COLONOSCOPY WITH PROPOFOL;  Surgeon: Laurence Spates, MD;   Location: [place];  Service: Endoscopy;  Laterality: N/A;   ESOPHAGOGASTRODUODENOSCOPY (EGD) WITH PROPOFOL N/A 09/25/2018   Procedure: ESOPHAGOGASTRODUODENOSCOPY (EGD) WITH PROPOFOL;  Surgeon: Ronald Lobo, MD;  Location: [facility];  Service: Endoscopy;  Laterality: N/A;   ESOPHAGOGASTRODUODENOSCOPY (EGD) WITH PROPOFOL N/A 10/29/2018   Procedure: ESOPHAGOGASTRODUODENOSCOPY (EGD) WITH PROPOFOL;  Surgeon: Ronald Lobo, MD;  Location: [facility];  Service: Endoscopy;  Laterality: N/A;   ESOPHAGOGASTRODUODENOSCOPY (EGD) WITH PROPOFOL N/A 08/20/2019   Procedure: ESOPHAGOGASTRODUODENOSCOPY (EGD) WITH PROPOFOL;  Surgeon: Otis Brace, MD;  Location: [place];  Service: Gastroenterology;  Laterality: N/A;   ESOPHAGOGASTRODUODENOSCOPY (EGD) WITH PROPOFOL N/A 05/03/2020   Procedure: ESOPHAGOGASTRODUODENOSCOPY (EGD) WITH PROPOFOL;  Surgeon: Ronald Lobo, MD;  Location: [facility];  Service: Endoscopy;  Laterality: N/A;   ESOPHAGOGASTRODUODENOSCOPY (EGD) WITH PROPOFOL N/A 06/02/2020   Procedure: ESOPHAGOGASTRODUODENOSCOPY (EGD) WITH PROPOFOL and Savory Dilatation;  Surgeon: Clarene Essex, MD;  Location: [facility];  Service: Gastroenterology;  Laterality: N/A;   HEMOSTASIS CLIP PLACEMENT  08/22/2019   Procedure: HEMOSTASIS CLIP PLACEMENT;  Surgeon: Laurence Spates, MD;  Location[facility];  Service: Endoscopy;;   KNEE SURGERY Left 2009   OVARIAN CYST REMOVAL     POLYPECTOMY  08/22/2019   Procedure: POLYPECTOMY;  Surgeon: Laurence Spates, MD;  Location: [place];  Service: Endoscopy;;   PVC ABLATION N/A 11/30/2016   Procedure: PVC Ablation;  Surgeon: Will [name], MD;  Location: [place] [facility];  Service: Cardiovascular;  Laterality: N/A;  RIGHT HEART CATH N/A 01/31/2018   Procedure: RIGHT HEART CATH;  Surgeon: Jolaine Artist, MD;  Location: [place] [facility];  Service: Cardiovascular;  Laterality: N/A;   RIGHT/LEFT HEART CATH AND CORONARY ANGIOGRAPHY  N/A 01/25/2017   Procedure: Right/Left Heart Cath and Coronary Angiography;  Surgeon: Jolaine Artist, MD;  Location: [place] [facility];  Service: Cardiovascular;  Laterality: N/A;   SAVORY DILATION N/A 09/25/2018   Procedure: SAVORY DILATION;  Surgeon: Ronald Lobo, MD;  Location: [facility];  Service: Endoscopy;  Laterality: N/A;   SAVORY DILATION N/A 10/29/2018   Procedure: SAVORY DILATION;  Surgeon: Ronald Lobo, MD;  Location: [facility];  Service: Endoscopy;  Laterality: N/A;   SAVORY DILATION N/A 08/20/2019   Procedure: SAVORY DILATION;  Surgeon: Otis Brace, MD;  Location: [facility];  Service: Gastroenterology;  Laterality: N/A;   SAVORY DILATION N/A 05/03/2020   Procedure: SAVORY DILATION;  Surgeon: Ronald Lobo, MD;  Location: [facility];  Service: Endoscopy;  Laterality: N/A;  Patient needs ultraslim pediatric upper endoscope/     no fluoro per dr. Cristal Deer DILATION N/A 06/02/2020   Procedure: SAVORY DILATION w/ Burtis Junes;  Surgeon: Clarene Essex, MD;  Location: [facility];  Service: Gastroenterology;  Laterality: N/A;    Family History  Problem Relation Age of Onset   Heart attack Father    Heart disease Father    Cancer Father    Hypertension Father    Hypertension Brother    Stroke Brother    Hypertension Brother    Diabetes Son    Hyperlipidemia Son    Hypertension Son    Social History:  reports that she quit smoking about 3 years ago. She has a 45.00 pack-year smoking history. She has never used smokeless tobacco. She reports that she does not drink alcohol and does not use drugs.  Allergies:  Allergies  Allergen Reactions   Penicillins Itching and Rash    Amoxicillin ok- IM pen is what gives reaction  Did it involve swelling of the face/tongue/throat, SOB, or low BP? No Did it involve sudden or severe rash/hives, skin peeling, or any reaction on the inside of your mouth or nose? No Did you need to seek medical  attention at a hospital or doctor's office? No When did it last happen?10 + year If all above answers are NO, may proceed with cephalosporin use.    Retacrit [Epoetin (Alfa)] Other (See Comments)    Chills, low grade fever, body aches, fatigue, nausea for 3 days.   Ceclor [Cefaclor] Itching   Crestor [Rosuvastatin Calcium] Nausea Only   Influenza Vaccines     Swelling and redness at injection site, fever   Nsaids     Pt states she is not taking because of kidney function   Percocet [Oxycodone-Acetaminophen] Nausea And Vomiting   Prednisone     Dizziness, spiked blood sugar    Medications Prior to Admission  Medication Sig Dispense Refill   acetaminophen (TYLENOL) 500 MG tablet Take 500 mg by mouth every 6 (six) hours as needed for moderate pain or headache.     amiodarone (PACERONE) 100 MG tablet Take 1 tablet (100 mg total) by mouth daily. 30 tablet 11   atorvastatin (LIPITOR) 20 MG tablet TAKE 1 TABLET BY MOUTH EVERY DAY 90 tablet 3   Camphor-Eucalyptus-Menthol (VICKS VAPORUB EX) Apply 1 application topically daily as needed (toe fungus).     carvedilol (COREG) 12.5 MG tablet Take 1 tablet (12.5 mg total) by mouth 2 (  two) times daily with a meal. 60 tablet 11   Cholecalciferol (VITAMIN D3) 50 MCG (2000 UT) TABS Take 2,000 Units by mouth daily.     diphenhydrAMINE (BENADRYL) 25 mg capsule Take 25 mg by mouth every 6 (six) hours as needed for allergies.      ELIQUIS 2.5 MG TABS tablet TAKE 1 TABLET BY MOUTH TWICE A DAY (Patient taking differently: Take 2.5 mg by mouth in the morning and at bedtime. ) 60 tablet 6   feeding supplement, ENSURE ENLIVE, (ENSURE ENLIVE) LIQD Take 237 mLs by mouth 2 (two) times daily between meals. (Patient taking differently: Take 237 mLs by mouth every evening. ) 237 mL 12   furosemide (LASIX) 20 MG tablet Take 20-40 mg by mouth See admin instructions. Alternate taking 1 tablet (20 mg) by mouth one day & take 2 tablets (40 mg) by mouth  the next     hydrALAZINE (APRESOLINE) 50 MG tablet TAKE 1 TABLET BY MOUTH THREE TIMES A DAY (Patient taking differently: Take 50 mg by mouth in the morning and at bedtime. ) 270 tablet 2   imiquimod (ALDARA) 5 % cream Apply topically.     pantoprazole (PROTONIX) 40 MG tablet Take 1 tablet (40 mg total) by mouth 2 (two) times daily before a meal. 60 tablet 1   Polyethyl Glycol-Propyl Glycol (SYSTANE OP) Place 1 drop into both eyes 3 (three) times daily as needed (dryness).      polyethylene glycol (MIRALAX / GLYCOLAX) 17 g packet Take 17 g by mouth daily as needed (constipation.).     vitamin B-12 (CYANOCOBALAMIN) 1000 MCG tablet Take 1 tablet (1,000 mcg total) by mouth daily. 30 tablet 0   furosemide (LASIX) 40 MG tablet Take 1 tablet (40 mg total) by mouth in the morning AND 0.5 tablets (20 mg total) every evening. (Patient not taking: Reported on 07/07/2020) 45 tablet 3    No results found for this or any previous visit (from the past 48 hour(s)). No results found.  Review of Systems see HPI  Pulse 70, temperature 97.8 F (36.6 C), temperature source Oral, resp. rate 15, height 5\' 5"  (1.651 m), weight 67.1 kg, SpO2 100 %. Physical Exam pleasant, healthy appearing, well nourished, cognitively intact, normal mood, no evident focal neurologic deficits. No pallor or icterus. Chest clear, heart currently without evident arrhythmia. Abdomen without significant tenderness  Assessment/Plan Dysphagia and persistent/recurrent esophageal stricture, last dilated to 12 mm about 6 weeks ago. We will proceed to endoscopy with the intent of dilatation, perhaps to a slightly larger diameter this time. Risks have been reviewed with the patient in the past. She is agreeable to proceed.  Cleotis Nipper, MD 07/12/2020, 1:11 PM

## 2020-07-13 ENCOUNTER — Encounter (HOSPITAL_COMMUNITY): Payer: Self-pay | Admitting: Gastroenterology

## 2020-07-14 NOTE — Anesthesia Postprocedure Evaluation (Signed)
Anesthesia Post Note  Patient: Krista Mason  Procedure(s) Performed: ESOPHAGOGASTRODUODENOSCOPY (EGD) WITH PROPOFOL with dilatation (N/A ) BALLOON DILATION (N/A )     Patient location during evaluation: PACU Anesthesia Type: MAC Level of consciousness: awake and alert Pain management: pain level controlled Vital Signs Assessment: post-procedure vital signs reviewed and stable Respiratory status: spontaneous breathing Cardiovascular status: stable Anesthetic complications: no   No complications documented.  Last Vitals:  Vitals:   07/12/20 1410 07/12/20 1420  BP: (!) 168/53 (!) 168/53  Pulse: 69 70  Resp: 18 15  Temp:    SpO2: 97% 97%    Last Pain:  Vitals:   07/13/20 1121  TempSrc:   PainSc: 0-No pain                 Nolon Nations

## 2020-07-19 ENCOUNTER — Other Ambulatory Visit: Payer: Self-pay

## 2020-07-19 ENCOUNTER — Ambulatory Visit (HOSPITAL_COMMUNITY)
Admission: RE | Admit: 2020-07-19 | Discharge: 2020-07-19 | Disposition: A | Discharge status: Home / Self Care | Payer: Medicare HMO | Source: Ambulatory Visit | Attending: Nephrology | Admitting: Nephrology

## 2020-07-19 VITALS — BP 161/66 | HR 70 | Temp 97.3°F | Resp 20

## 2020-07-19 DIAGNOSIS — N183 Chronic kidney disease, stage 3 unspecified: Secondary | ICD-10-CM | POA: Diagnosis not present

## 2020-07-19 LAB — POCT HEMOGLOBIN-HEMACUE: Hemoglobin: 12.3 g/dL (ref 12.0–15.0)

## 2020-07-19 MED ORDER — DARBEPOETIN ALFA 100 MCG/0.5ML IJ SOSY
PREFILLED_SYRINGE | INTRAMUSCULAR | Status: AC
  Filled 2020-07-19: qty 0.5

## 2020-07-19 MED ORDER — DARBEPOETIN ALFA 100 MCG/0.5ML IJ SOSY: 100.0000 ug | PREFILLED_SYRINGE | INTRAMUSCULAR | Status: DC

## 2020-07-21 DIAGNOSIS — D631 Anemia in chronic kidney disease: Secondary | ICD-10-CM | POA: Diagnosis not present

## 2020-07-21 DIAGNOSIS — K2210 Ulcer of esophagus without bleeding: Secondary | ICD-10-CM | POA: Diagnosis not present

## 2020-07-21 DIAGNOSIS — I129 Hypertensive chronic kidney disease with stage 1 through stage 4 chronic kidney disease, or unspecified chronic kidney disease: Secondary | ICD-10-CM | POA: Diagnosis not present

## 2020-07-21 DIAGNOSIS — N2581 Secondary hyperparathyroidism of renal origin: Secondary | ICD-10-CM | POA: Diagnosis not present

## 2020-07-21 DIAGNOSIS — N184 Chronic kidney disease, stage 4 (severe): Secondary | ICD-10-CM | POA: Diagnosis not present

## 2020-07-21 DIAGNOSIS — I4891 Unspecified atrial fibrillation: Secondary | ICD-10-CM | POA: Diagnosis not present

## 2020-07-21 DIAGNOSIS — I5022 Chronic systolic (congestive) heart failure: Secondary | ICD-10-CM | POA: Diagnosis not present

## 2020-07-21 DIAGNOSIS — D509 Iron deficiency anemia, unspecified: Secondary | ICD-10-CM | POA: Diagnosis not present

## 2020-07-26 ENCOUNTER — Encounter (HOSPITAL_COMMUNITY): Payer: Medicare HMO

## 2020-07-26 DIAGNOSIS — C44329 Squamous cell carcinoma of skin of other parts of face: Secondary | ICD-10-CM | POA: Diagnosis not present

## 2020-07-26 DIAGNOSIS — D0462 Carcinoma in situ of skin of left upper limb, including shoulder: Secondary | ICD-10-CM | POA: Diagnosis not present

## 2020-07-26 DIAGNOSIS — D489 Neoplasm of uncertain behavior, unspecified: Secondary | ICD-10-CM | POA: Diagnosis not present

## 2020-08-02 ENCOUNTER — Ambulatory Visit (HOSPITAL_COMMUNITY)
Admission: RE | Admit: 2020-08-02 | Discharge: 2020-08-02 | Disposition: A | Discharge status: Home / Self Care | Payer: Medicare HMO | Source: Ambulatory Visit | Attending: Nephrology | Admitting: Nephrology

## 2020-08-02 ENCOUNTER — Other Ambulatory Visit: Payer: Self-pay

## 2020-08-02 ENCOUNTER — Ambulatory Visit (INDEPENDENT_AMBULATORY_CARE_PROVIDER_SITE_OTHER): Payer: Medicare HMO

## 2020-08-02 VITALS — BP 130/46 | HR 69 | Temp 97.0°F | Resp 20

## 2020-08-02 DIAGNOSIS — N183 Chronic kidney disease, stage 3 unspecified: Secondary | ICD-10-CM | POA: Insufficient documentation

## 2020-08-02 DIAGNOSIS — I442 Atrioventricular block, complete: Secondary | ICD-10-CM

## 2020-08-02 DIAGNOSIS — D631 Anemia in chronic kidney disease: Secondary | ICD-10-CM | POA: Diagnosis not present

## 2020-08-02 LAB — CUP PACEART REMOTE DEVICE CHECK
Battery Remaining Longevity: 110 mo
Battery Voltage: 3 V
Brady Statistic AP VP Percent: 98.66 %
Brady Statistic AP VS Percent: 0.08 %
Brady Statistic AS VP Percent: 0.66 %
Brady Statistic AS VS Percent: 0.6 %
Brady Statistic RA Percent Paced: 99.03 %
Brady Statistic RV Percent Paced: 99.32 %
Date Time Interrogation Session: 20211012040529
Implantable Lead Implant Date: 20201002
Implantable Lead Implant Date: 20201002
Implantable Lead Implant Date: 20201002
Implantable Lead Location: 753858
Implantable Lead Location: 753859
Implantable Lead Location: 753860
Implantable Lead Model: 4598
Implantable Lead Model: 5076
Implantable Lead Model: 5076
Implantable Pulse Generator Implant Date: 20201002
Lead Channel Impedance Value: 304 Ohm
Lead Channel Impedance Value: 342 Ohm
Lead Channel Impedance Value: 342 Ohm
Lead Channel Impedance Value: 361 Ohm
Lead Channel Impedance Value: 418 Ohm
Lead Channel Impedance Value: 418 Ohm
Lead Channel Impedance Value: 494 Ohm
Lead Channel Impedance Value: 513 Ohm
Lead Channel Impedance Value: 570 Ohm
Lead Channel Impedance Value: 589 Ohm
Lead Channel Impedance Value: 608 Ohm
Lead Channel Impedance Value: 627 Ohm
Lead Channel Impedance Value: 627 Ohm
Lead Channel Impedance Value: 646 Ohm
Lead Channel Pacing Threshold Amplitude: 0.625 V
Lead Channel Pacing Threshold Amplitude: 0.75 V
Lead Channel Pacing Threshold Amplitude: 1.625 V
Lead Channel Pacing Threshold Pulse Width: 0.4 ms
Lead Channel Pacing Threshold Pulse Width: 0.4 ms
Lead Channel Pacing Threshold Pulse Width: 0.4 ms
Lead Channel Sensing Intrinsic Amplitude: 1.5 mV
Lead Channel Sensing Intrinsic Amplitude: 1.5 mV
Lead Channel Sensing Intrinsic Amplitude: 31.625 mV
Lead Channel Sensing Intrinsic Amplitude: 31.625 mV
Lead Channel Setting Pacing Amplitude: 1.75 V
Lead Channel Setting Pacing Amplitude: 2 V
Lead Channel Setting Pacing Amplitude: 2 V
Lead Channel Setting Pacing Pulse Width: 0.4 ms
Lead Channel Setting Pacing Pulse Width: 0.4 ms
Lead Channel Setting Sensing Sensitivity: 1.2 mV

## 2020-08-02 LAB — IRON AND TIBC
Iron: 77 ug/dL (ref 28–170)
Saturation Ratios: 24 % (ref 10.4–31.8)
TIBC: 325 ug/dL (ref 250–450)
UIBC: 248 ug/dL

## 2020-08-02 LAB — POCT HEMOGLOBIN-HEMACUE: Hemoglobin: 12.5 g/dL (ref 12.0–15.0)

## 2020-08-02 LAB — FERRITIN: Ferritin: 221 ng/mL (ref 11–307)

## 2020-08-02 MED ORDER — DARBEPOETIN ALFA 100 MCG/0.5ML IJ SOSY: 100.0000 ug | PREFILLED_SYRINGE | INTRAMUSCULAR | Status: DC

## 2020-08-03 NOTE — Progress Notes (Signed)
Remote pacemaker transmission.   

## 2020-08-11 ENCOUNTER — Telehealth (HOSPITAL_COMMUNITY): Payer: Self-pay | Admitting: Pharmacy Technician

## 2020-08-11 NOTE — Telephone Encounter (Signed)
Patient has hit the donut hole and her co-pay for Eliquis went from $47 to $147 for a 30 day supply.  She is going to fax over her [employer] application along with the OOP expense form.  Will fax upon receipt.

## 2020-08-16 ENCOUNTER — Encounter (HOSPITAL_COMMUNITY)
Admission: RE | Admit: 2020-08-16 | Discharge: 2020-08-16 | Disposition: A | Discharge status: Home / Self Care | Payer: Medicare HMO | Source: Ambulatory Visit | Attending: Nephrology | Admitting: Nephrology

## 2020-08-16 ENCOUNTER — Encounter (HOSPITAL_COMMUNITY): Payer: Medicare HMO

## 2020-08-16 ENCOUNTER — Other Ambulatory Visit: Payer: Self-pay

## 2020-08-16 VITALS — BP 145/54 | HR 70 | Temp 97.9°F

## 2020-08-16 DIAGNOSIS — N183 Chronic kidney disease, stage 3 unspecified: Secondary | ICD-10-CM | POA: Insufficient documentation

## 2020-08-16 LAB — POCT HEMOGLOBIN-HEMACUE: Hemoglobin: 11.9 g/dL — ABNORMAL LOW (ref 12.0–15.0)

## 2020-08-16 MED ORDER — DARBEPOETIN ALFA 100 MCG/0.5ML IJ SOSY
100.0000 ug | PREFILLED_SYRINGE | INTRAMUSCULAR | Status: DC
  Administered 2020-08-16: 100 ug via SUBCUTANEOUS

## 2020-08-16 MED ORDER — DARBEPOETIN ALFA 100 MCG/0.5ML IJ SOSY
PREFILLED_SYRINGE | INTRAMUSCULAR | Status: AC
  Filled 2020-08-16: qty 0.5

## 2020-08-18 NOTE — Telephone Encounter (Signed)
Called [employer] to check the status of the patients application. Will have to refax application, despite successful confirmation.  Will follow up.

## 2020-08-24 NOTE — Telephone Encounter (Signed)
Sent in application via efax.  Will follow up.

## 2020-08-26 NOTE — Telephone Encounter (Signed)
Called [employer] to confirm application receipt. The representative stated that they received the application. Will follow up on the status next week.

## 2020-08-29 NOTE — Telephone Encounter (Signed)
Advanced Heart Failure Patient Advocate Encounter   Patient was approved to receive Eliquis from [employer]  Patient ID: [id number] Effective dates: 08/29/20 through 10/21/20  Called and left the patient a message regarding approval.  Charlann Boxer, CPhT

## 2020-08-30 ENCOUNTER — Encounter (HOSPITAL_COMMUNITY): Payer: Medicare HMO

## 2020-09-12 DIAGNOSIS — M810 Age-related osteoporosis without current pathological fracture: Secondary | ICD-10-CM | POA: Diagnosis not present

## 2020-09-12 DIAGNOSIS — J449 Chronic obstructive pulmonary disease, unspecified: Secondary | ICD-10-CM | POA: Diagnosis not present

## 2020-09-12 DIAGNOSIS — E785 Hyperlipidemia, unspecified: Secondary | ICD-10-CM | POA: Diagnosis not present

## 2020-09-12 DIAGNOSIS — E1122 Type 2 diabetes mellitus with diabetic chronic kidney disease: Secondary | ICD-10-CM | POA: Diagnosis not present

## 2020-09-12 DIAGNOSIS — I5040 Unspecified combined systolic (congestive) and diastolic (congestive) heart failure: Secondary | ICD-10-CM | POA: Diagnosis not present

## 2020-09-12 DIAGNOSIS — N184 Chronic kidney disease, stage 4 (severe): Secondary | ICD-10-CM | POA: Diagnosis not present

## 2020-09-12 DIAGNOSIS — E119 Type 2 diabetes mellitus without complications: Secondary | ICD-10-CM | POA: Diagnosis not present

## 2020-09-12 DIAGNOSIS — I10 Essential (primary) hypertension: Secondary | ICD-10-CM | POA: Diagnosis not present

## 2020-09-12 DIAGNOSIS — I5022 Chronic systolic (congestive) heart failure: Secondary | ICD-10-CM | POA: Diagnosis not present

## 2020-09-12 DIAGNOSIS — D500 Iron deficiency anemia secondary to blood loss (chronic): Secondary | ICD-10-CM | POA: Diagnosis not present

## 2020-09-13 ENCOUNTER — Ambulatory Visit (HOSPITAL_COMMUNITY)
Admission: RE | Admit: 2020-09-13 | Discharge: 2020-09-13 | Disposition: A | Discharge status: Home / Self Care | Payer: Medicare HMO | Source: Ambulatory Visit | Attending: Nephrology | Admitting: Nephrology

## 2020-09-13 ENCOUNTER — Other Ambulatory Visit: Payer: Self-pay

## 2020-09-13 VITALS — BP 129/58 | HR 68 | Temp 97.8°F | Resp 20

## 2020-09-13 DIAGNOSIS — N183 Chronic kidney disease, stage 3 unspecified: Secondary | ICD-10-CM | POA: Insufficient documentation

## 2020-09-13 LAB — POCT HEMOGLOBIN-HEMACUE: Hemoglobin: 11.3 g/dL — ABNORMAL LOW (ref 12.0–15.0)

## 2020-09-13 MED ORDER — DARBEPOETIN ALFA 100 MCG/0.5ML IJ SOSY
PREFILLED_SYRINGE | INTRAMUSCULAR | Status: AC
  Filled 2020-09-13: qty 0.5

## 2020-09-13 MED ORDER — DARBEPOETIN ALFA 100 MCG/0.5ML IJ SOSY
100.0000 ug | PREFILLED_SYRINGE | INTRAMUSCULAR | Status: DC
  Administered 2020-09-13: 100 ug via SUBCUTANEOUS

## 2020-09-21 DIAGNOSIS — I509 Heart failure, unspecified: Secondary | ICD-10-CM | POA: Diagnosis not present

## 2020-09-21 DIAGNOSIS — I25119 Atherosclerotic heart disease of native coronary artery with unspecified angina pectoris: Secondary | ICD-10-CM | POA: Diagnosis not present

## 2020-09-21 DIAGNOSIS — H04129 Dry eye syndrome of unspecified lacrimal gland: Secondary | ICD-10-CM | POA: Diagnosis not present

## 2020-09-21 DIAGNOSIS — E785 Hyperlipidemia, unspecified: Secondary | ICD-10-CM | POA: Diagnosis not present

## 2020-09-21 DIAGNOSIS — I429 Cardiomyopathy, unspecified: Secondary | ICD-10-CM | POA: Diagnosis not present

## 2020-09-21 DIAGNOSIS — E261 Secondary hyperaldosteronism: Secondary | ICD-10-CM | POA: Diagnosis not present

## 2020-09-21 DIAGNOSIS — I110 Hypertensive heart disease with heart failure: Secondary | ICD-10-CM | POA: Diagnosis not present

## 2020-09-21 DIAGNOSIS — D6869 Other thrombophilia: Secondary | ICD-10-CM | POA: Diagnosis not present

## 2020-09-21 DIAGNOSIS — E1151 Type 2 diabetes mellitus with diabetic peripheral angiopathy without gangrene: Secondary | ICD-10-CM | POA: Diagnosis not present

## 2020-09-21 DIAGNOSIS — I4891 Unspecified atrial fibrillation: Secondary | ICD-10-CM | POA: Diagnosis not present

## 2020-09-27 ENCOUNTER — Telehealth: Payer: Self-pay | Admitting: Cardiology

## 2020-09-27 NOTE — Telephone Encounter (Signed)
The patient reports at about 2000 last night her heart started racing. It was beating so fast she couldn't count the rate. She could tell there were skipped beats or irregularities. She did have SOB and was very fatigued during the episode.  The racing resolved about 0400 this AM but she still doesn't feel "quite right." She is still fatigued and experiencing some SOB. She is also tired because she didn't sleep much. Overall, she feels better and is not in distress. She has not taken her morning medications yet and she was instructed to do so.  She understands this message will be sent to the [facility] for a remote check if possible and she will be called later with recommendations. She was grateful for call.

## 2020-09-27 NOTE — Telephone Encounter (Signed)
Spoke with patient and she has sent that transmission and wants a nurse to look at it

## 2020-09-27 NOTE — Telephone Encounter (Signed)
STAT if HR is under 50 or over 120 (normal HR is 60-100 beats per minute)  1) What is your heart rate? Pt is not sure. She said it is too fast for her to count  2) Do you have a log of your heart rate readings (document readings)? no  3) Do you have any other symptoms? SOB and fatigue  Pt c/o Shortness Of Breath: STAT if SOB developed within the last 24 hours or pt is noticeably SOB on the phone  1. Are you currently SOB (can you hear that pt is SOB on the phone)? Pt said she is a little SOB now.   2. How Reasons have you been experiencing SOB? A Goltz time  3. Are you SOB when sitting or when up moving around? Up and moving around  4. Are you currently experiencing any other symptoms? Fast HR and fatigue   Pt just feels terrible. She can feel her HR is fast and wanted to know if her rapid HR would have shown up on her Pacemaker reading.

## 2020-09-27 NOTE — Telephone Encounter (Signed)
Manual transmission received. Presenting AP/BVP 69. No events noted, normal device function. Advised patient there are no events on her device that warrant her symptoms. Advised patient she needs to follow up in regards to how she is feeling. Patient states she will call her doctor as soon as we have up. Advised to call back with any questions or concerns. Verbalized understanding.

## 2020-10-07 ENCOUNTER — Telehealth: Payer: Self-pay | Admitting: *Deleted

## 2020-10-07 ENCOUNTER — Ambulatory Visit (INDEPENDENT_AMBULATORY_CARE_PROVIDER_SITE_OTHER): Payer: Medicare HMO

## 2020-10-07 DIAGNOSIS — I493 Ventricular premature depolarization: Secondary | ICD-10-CM

## 2020-10-07 NOTE — Telephone Encounter (Signed)
Pt aware of recommendation and is agreeable to plan. Aware I will send Zio instructions via mychart and that monitor will be mailed to home address (address verified w/ pt) . Aware will follow up once monitor completed/reviewed and will arrange follow up at that time. Pt is agreeable to plan

## 2020-10-07 NOTE — Telephone Encounter (Signed)
Constance Haw, MD  Bensimhon, Shaune Pascal, MD; [name], RN Could certainly be PVCs. The best way to know would be to wear a 3-day ZIO. I will put one on her today. Thanks.        Previous Messages   ----- Message -----  From: Jolaine Artist, MD  Sent: 09/29/2020  5:18 PM EST  To: Constance Haw, MD, *   Looks like she is having frequent PVCs. No?   ----- Message -----  From: Harvie Junior, CMA  Sent: 09/27/2020  5:05 PM EST  To: Jolaine Artist, MD   Pt asked that Dr.Bensimhon take a look at these notes and her transmission. Pt still c/o pounding fast heart rate

## 2020-10-11 ENCOUNTER — Ambulatory Visit (HOSPITAL_COMMUNITY)
Admission: RE | Admit: 2020-10-11 | Discharge: 2020-10-11 | Disposition: A | Discharge status: Home / Self Care | Payer: Medicare HMO | Source: Ambulatory Visit | Attending: Nephrology | Admitting: Nephrology

## 2020-10-11 ENCOUNTER — Other Ambulatory Visit: Payer: Self-pay

## 2020-10-11 VITALS — BP 110/47 | HR 70 | Temp 97.1°F | Resp 20

## 2020-10-11 DIAGNOSIS — N183 Chronic kidney disease, stage 3 unspecified: Secondary | ICD-10-CM | POA: Insufficient documentation

## 2020-10-11 DIAGNOSIS — D631 Anemia in chronic kidney disease: Secondary | ICD-10-CM | POA: Diagnosis not present

## 2020-10-11 LAB — IRON AND TIBC
Iron: 47 ug/dL (ref 28–170)
Saturation Ratios: 11 % (ref 10.4–31.8)
TIBC: 424 ug/dL (ref 250–450)
UIBC: 377 ug/dL

## 2020-10-11 LAB — FERRITIN: Ferritin: 22 ng/mL (ref 11–307)

## 2020-10-11 LAB — POCT HEMOGLOBIN-HEMACUE: Hemoglobin: 11.7 g/dL — ABNORMAL LOW (ref 12.0–15.0)

## 2020-10-11 MED ORDER — DARBEPOETIN ALFA 100 MCG/0.5ML IJ SOSY
PREFILLED_SYRINGE | INTRAMUSCULAR | Status: AC
  Filled 2020-10-11: qty 0.5

## 2020-10-11 MED ORDER — DARBEPOETIN ALFA 100 MCG/0.5ML IJ SOSY
100.0000 ug | PREFILLED_SYRINGE | INTRAMUSCULAR | Status: DC
  Administered 2020-10-11: 100 ug via SUBCUTANEOUS

## 2020-10-13 ENCOUNTER — Telehealth (HOSPITAL_COMMUNITY): Payer: Self-pay | Admitting: Pharmacy Technician

## 2020-10-13 NOTE — Telephone Encounter (Signed)
Patient sent in renewal application for Eliquis assistance, sent application in via fax to [place].  Will follow up.

## 2020-10-25 DIAGNOSIS — I493 Ventricular premature depolarization: Secondary | ICD-10-CM | POA: Diagnosis not present

## 2020-11-01 ENCOUNTER — Ambulatory Visit (INDEPENDENT_AMBULATORY_CARE_PROVIDER_SITE_OTHER): Payer: Medicare HMO

## 2020-11-01 DIAGNOSIS — E1122 Type 2 diabetes mellitus with diabetic chronic kidney disease: Secondary | ICD-10-CM | POA: Diagnosis not present

## 2020-11-01 DIAGNOSIS — E119 Type 2 diabetes mellitus without complications: Secondary | ICD-10-CM | POA: Diagnosis not present

## 2020-11-01 DIAGNOSIS — J449 Chronic obstructive pulmonary disease, unspecified: Secondary | ICD-10-CM | POA: Diagnosis not present

## 2020-11-01 DIAGNOSIS — I5022 Chronic systolic (congestive) heart failure: Secondary | ICD-10-CM | POA: Diagnosis not present

## 2020-11-01 DIAGNOSIS — D500 Iron deficiency anemia secondary to blood loss (chronic): Secondary | ICD-10-CM | POA: Diagnosis not present

## 2020-11-01 DIAGNOSIS — I10 Essential (primary) hypertension: Secondary | ICD-10-CM | POA: Diagnosis not present

## 2020-11-01 DIAGNOSIS — N184 Chronic kidney disease, stage 4 (severe): Secondary | ICD-10-CM | POA: Diagnosis not present

## 2020-11-01 DIAGNOSIS — I442 Atrioventricular block, complete: Secondary | ICD-10-CM | POA: Diagnosis not present

## 2020-11-01 DIAGNOSIS — I5040 Unspecified combined systolic (congestive) and diastolic (congestive) heart failure: Secondary | ICD-10-CM | POA: Diagnosis not present

## 2020-11-01 DIAGNOSIS — M810 Age-related osteoporosis without current pathological fracture: Secondary | ICD-10-CM | POA: Diagnosis not present

## 2020-11-01 DIAGNOSIS — E785 Hyperlipidemia, unspecified: Secondary | ICD-10-CM | POA: Diagnosis not present

## 2020-11-02 LAB — CUP PACEART REMOTE DEVICE CHECK
Battery Remaining Longevity: 106 mo
Battery Voltage: 3 V
Brady Statistic AP VP Percent: 94.6 %
Brady Statistic AP VS Percent: 0.08 %
Brady Statistic AS VP Percent: 3.52 %
Brady Statistic AS VS Percent: 1.8 %
Brady Statistic RA Percent Paced: 95.42 %
Brady Statistic RV Percent Paced: 98.11 %
Date Time Interrogation Session: 20220110223414
Implantable Lead Implant Date: 20201002
Implantable Lead Implant Date: 20201002
Implantable Lead Implant Date: 20201002
Implantable Lead Location: 753858
Implantable Lead Location: 753859
Implantable Lead Location: 753860
Implantable Lead Model: 4598
Implantable Lead Model: 5076
Implantable Lead Model: 5076
Implantable Pulse Generator Implant Date: 20201002
Lead Channel Impedance Value: 285 Ohm
Lead Channel Impedance Value: 342 Ohm
Lead Channel Impedance Value: 361 Ohm
Lead Channel Impedance Value: 380 Ohm
Lead Channel Impedance Value: 399 Ohm
Lead Channel Impedance Value: 399 Ohm
Lead Channel Impedance Value: 418 Ohm
Lead Channel Impedance Value: 513 Ohm
Lead Channel Impedance Value: 551 Ohm
Lead Channel Impedance Value: 589 Ohm
Lead Channel Impedance Value: 589 Ohm
Lead Channel Impedance Value: 627 Ohm
Lead Channel Impedance Value: 627 Ohm
Lead Channel Impedance Value: 627 Ohm
Lead Channel Pacing Threshold Amplitude: 0.625 V
Lead Channel Pacing Threshold Amplitude: 0.75 V
Lead Channel Pacing Threshold Amplitude: 1.875 V
Lead Channel Pacing Threshold Pulse Width: 0.4 ms
Lead Channel Pacing Threshold Pulse Width: 0.4 ms
Lead Channel Pacing Threshold Pulse Width: 0.4 ms
Lead Channel Sensing Intrinsic Amplitude: 1.5 mV
Lead Channel Sensing Intrinsic Amplitude: 1.5 mV
Lead Channel Sensing Intrinsic Amplitude: 28.625 mV
Lead Channel Sensing Intrinsic Amplitude: 28.625 mV
Lead Channel Setting Pacing Amplitude: 1.5 V
Lead Channel Setting Pacing Amplitude: 2 V
Lead Channel Setting Pacing Amplitude: 2 V
Lead Channel Setting Pacing Pulse Width: 0.4 ms
Lead Channel Setting Pacing Pulse Width: 0.4 ms
Lead Channel Setting Sensing Sensitivity: 1.2 mV

## 2020-11-07 ENCOUNTER — Other Ambulatory Visit (HOSPITAL_COMMUNITY): Payer: Self-pay | Admitting: *Deleted

## 2020-11-08 ENCOUNTER — Encounter (HOSPITAL_COMMUNITY): Payer: Medicare HMO

## 2020-11-15 ENCOUNTER — Ambulatory Visit (HOSPITAL_COMMUNITY)
Admission: RE | Admit: 2020-11-15 | Discharge: 2020-11-15 | Disposition: A | Discharge status: Home / Self Care | Payer: Medicare HMO | Source: Ambulatory Visit | Attending: Nephrology | Admitting: Nephrology

## 2020-11-15 ENCOUNTER — Other Ambulatory Visit: Payer: Self-pay

## 2020-11-15 VITALS — BP 123/36 | HR 69 | Temp 97.8°F

## 2020-11-15 DIAGNOSIS — N183 Chronic kidney disease, stage 3 unspecified: Secondary | ICD-10-CM | POA: Diagnosis not present

## 2020-11-15 DIAGNOSIS — D631 Anemia in chronic kidney disease: Secondary | ICD-10-CM | POA: Insufficient documentation

## 2020-11-15 LAB — FERRITIN: Ferritin: 13 ng/mL (ref 11–307)

## 2020-11-15 LAB — IRON AND TIBC
Iron: 40 ug/dL (ref 28–170)
Saturation Ratios: 9 % — ABNORMAL LOW (ref 10.4–31.8)
TIBC: 433 ug/dL (ref 250–450)
UIBC: 393 ug/dL

## 2020-11-15 LAB — POCT HEMOGLOBIN-HEMACUE: Hemoglobin: 10 g/dL — ABNORMAL LOW (ref 12.0–15.0)

## 2020-11-15 MED ORDER — DARBEPOETIN ALFA 100 MCG/0.5ML IJ SOSY
PREFILLED_SYRINGE | INTRAMUSCULAR | Status: AC
  Filled 2020-11-15: qty 0.5

## 2020-11-15 MED ORDER — DARBEPOETIN ALFA 100 MCG/0.5ML IJ SOSY
100.0000 ug | PREFILLED_SYRINGE | INTRAMUSCULAR | Status: DC
  Administered 2020-11-15: 100 ug via SUBCUTANEOUS

## 2020-11-16 DIAGNOSIS — I129 Hypertensive chronic kidney disease with stage 1 through stage 4 chronic kidney disease, or unspecified chronic kidney disease: Secondary | ICD-10-CM | POA: Diagnosis not present

## 2020-11-16 DIAGNOSIS — N184 Chronic kidney disease, stage 4 (severe): Secondary | ICD-10-CM | POA: Diagnosis not present

## 2020-11-16 DIAGNOSIS — D509 Iron deficiency anemia, unspecified: Secondary | ICD-10-CM | POA: Diagnosis not present

## 2020-11-16 DIAGNOSIS — K2210 Ulcer of esophagus without bleeding: Secondary | ICD-10-CM | POA: Diagnosis not present

## 2020-11-16 DIAGNOSIS — D631 Anemia in chronic kidney disease: Secondary | ICD-10-CM | POA: Diagnosis not present

## 2020-11-16 DIAGNOSIS — N2581 Secondary hyperparathyroidism of renal origin: Secondary | ICD-10-CM | POA: Diagnosis not present

## 2020-11-16 DIAGNOSIS — I4891 Unspecified atrial fibrillation: Secondary | ICD-10-CM | POA: Diagnosis not present

## 2020-11-17 NOTE — Progress Notes (Signed)
Remote pacemaker transmission.   

## 2020-11-22 ENCOUNTER — Encounter (HOSPITAL_COMMUNITY)
Admission: RE | Admit: 2020-11-22 | Discharge: 2020-11-22 | Disposition: A | Discharge status: Home / Self Care | Payer: Medicare HMO | Source: Ambulatory Visit | Attending: Nephrology | Admitting: Nephrology

## 2020-11-22 ENCOUNTER — Other Ambulatory Visit: Payer: Self-pay

## 2020-11-22 DIAGNOSIS — D631 Anemia in chronic kidney disease: Secondary | ICD-10-CM | POA: Insufficient documentation

## 2020-11-22 DIAGNOSIS — N183 Chronic kidney disease, stage 3 unspecified: Secondary | ICD-10-CM | POA: Insufficient documentation

## 2020-11-22 MED ORDER — SODIUM CHLORIDE 0.9 % IV SOLN
510.0000 mg | INTRAVENOUS | Status: DC
  Administered 2020-11-22: 510 mg via INTRAVENOUS
  Filled 2020-11-22: qty 17

## 2020-11-29 ENCOUNTER — Emergency Department (HOSPITAL_BASED_OUTPATIENT_CLINIC_OR_DEPARTMENT_OTHER)
Admission: EM | Admit: 2020-11-29 | Discharge: 2020-11-30 | Disposition: A | Discharge status: Home / Self Care | Payer: Medicare HMO | Attending: Emergency Medicine | Admitting: Emergency Medicine

## 2020-11-29 ENCOUNTER — Encounter (HOSPITAL_BASED_OUTPATIENT_CLINIC_OR_DEPARTMENT_OTHER): Payer: Self-pay | Admitting: *Deleted

## 2020-11-29 ENCOUNTER — Emergency Department (HOSPITAL_BASED_OUTPATIENT_CLINIC_OR_DEPARTMENT_OTHER): Payer: Medicare HMO

## 2020-11-29 ENCOUNTER — Emergency Department (HOSPITAL_COMMUNITY): Payer: Medicare HMO

## 2020-11-29 ENCOUNTER — Other Ambulatory Visit: Payer: Self-pay

## 2020-11-29 ENCOUNTER — Encounter (HOSPITAL_COMMUNITY): Payer: Medicare HMO

## 2020-11-29 DIAGNOSIS — J449 Chronic obstructive pulmonary disease, unspecified: Secondary | ICD-10-CM | POA: Insufficient documentation

## 2020-11-29 DIAGNOSIS — N1832 Chronic kidney disease, stage 3b: Secondary | ICD-10-CM | POA: Diagnosis not present

## 2020-11-29 DIAGNOSIS — E1122 Type 2 diabetes mellitus with diabetic chronic kidney disease: Secondary | ICD-10-CM | POA: Diagnosis not present

## 2020-11-29 DIAGNOSIS — R1110 Vomiting, unspecified: Secondary | ICD-10-CM | POA: Diagnosis not present

## 2020-11-29 DIAGNOSIS — Z79899 Other long term (current) drug therapy: Secondary | ICD-10-CM | POA: Diagnosis not present

## 2020-11-29 DIAGNOSIS — Z7901 Long term (current) use of anticoagulants: Secondary | ICD-10-CM | POA: Insufficient documentation

## 2020-11-29 DIAGNOSIS — R112 Nausea with vomiting, unspecified: Secondary | ICD-10-CM | POA: Diagnosis not present

## 2020-11-29 DIAGNOSIS — I480 Paroxysmal atrial fibrillation: Secondary | ICD-10-CM | POA: Diagnosis not present

## 2020-11-29 DIAGNOSIS — N289 Disorder of kidney and ureter, unspecified: Secondary | ICD-10-CM | POA: Diagnosis not present

## 2020-11-29 DIAGNOSIS — I959 Hypotension, unspecified: Secondary | ICD-10-CM | POA: Diagnosis not present

## 2020-11-29 DIAGNOSIS — Z743 Need for continuous supervision: Secondary | ICD-10-CM | POA: Diagnosis not present

## 2020-11-29 DIAGNOSIS — M549 Dorsalgia, unspecified: Secondary | ICD-10-CM | POA: Diagnosis not present

## 2020-11-29 DIAGNOSIS — R109 Unspecified abdominal pain: Secondary | ICD-10-CM | POA: Diagnosis not present

## 2020-11-29 DIAGNOSIS — I5042 Chronic combined systolic (congestive) and diastolic (congestive) heart failure: Secondary | ICD-10-CM | POA: Insufficient documentation

## 2020-11-29 DIAGNOSIS — Z20822 Contact with and (suspected) exposure to covid-19: Secondary | ICD-10-CM | POA: Diagnosis not present

## 2020-11-29 DIAGNOSIS — E1151 Type 2 diabetes mellitus with diabetic peripheral angiopathy without gangrene: Secondary | ICD-10-CM | POA: Insufficient documentation

## 2020-11-29 DIAGNOSIS — J90 Pleural effusion, not elsewhere classified: Secondary | ICD-10-CM | POA: Diagnosis not present

## 2020-11-29 DIAGNOSIS — D649 Anemia, unspecified: Secondary | ICD-10-CM

## 2020-11-29 DIAGNOSIS — Z950 Presence of cardiac pacemaker: Secondary | ICD-10-CM | POA: Insufficient documentation

## 2020-11-29 DIAGNOSIS — J439 Emphysema, unspecified: Secondary | ICD-10-CM | POA: Diagnosis not present

## 2020-11-29 DIAGNOSIS — Z87891 Personal history of nicotine dependence: Secondary | ICD-10-CM | POA: Insufficient documentation

## 2020-11-29 DIAGNOSIS — E1169 Type 2 diabetes mellitus with other specified complication: Secondary | ICD-10-CM | POA: Diagnosis not present

## 2020-11-29 DIAGNOSIS — R791 Abnormal coagulation profile: Secondary | ICD-10-CM | POA: Diagnosis not present

## 2020-11-29 DIAGNOSIS — E785 Hyperlipidemia, unspecified: Secondary | ICD-10-CM | POA: Insufficient documentation

## 2020-11-29 DIAGNOSIS — R079 Chest pain, unspecified: Secondary | ICD-10-CM | POA: Diagnosis not present

## 2020-11-29 DIAGNOSIS — I130 Hypertensive heart and chronic kidney disease with heart failure and stage 1 through stage 4 chronic kidney disease, or unspecified chronic kidney disease: Secondary | ICD-10-CM | POA: Diagnosis not present

## 2020-11-29 DIAGNOSIS — R0781 Pleurodynia: Secondary | ICD-10-CM | POA: Diagnosis not present

## 2020-11-29 DIAGNOSIS — R110 Nausea: Secondary | ICD-10-CM | POA: Diagnosis not present

## 2020-11-29 DIAGNOSIS — R1111 Vomiting without nausea: Secondary | ICD-10-CM | POA: Diagnosis not present

## 2020-11-29 DIAGNOSIS — R7989 Other specified abnormal findings of blood chemistry: Secondary | ICD-10-CM

## 2020-11-29 LAB — LIPASE, BLOOD: Lipase: 36 U/L (ref 11–51)

## 2020-11-29 LAB — CBC WITH DIFFERENTIAL/PLATELET
Abs Immature Granulocytes: 0.08 10*3/uL — ABNORMAL HIGH (ref 0.00–0.07)
Basophils Absolute: 0 10*3/uL (ref 0.0–0.1)
Basophils Relative: 0 %
Eosinophils Absolute: 0 10*3/uL (ref 0.0–0.5)
Eosinophils Relative: 0 %
HCT: 33.3 % — ABNORMAL LOW (ref 36.0–46.0)
Hemoglobin: 10.6 g/dL — ABNORMAL LOW (ref 12.0–15.0)
Immature Granulocytes: 1 %
Lymphocytes Relative: 5 %
Lymphs Abs: 0.8 10*3/uL (ref 0.7–4.0)
MCH: 28.4 pg (ref 26.0–34.0)
MCHC: 31.8 g/dL (ref 30.0–36.0)
MCV: 89.3 fL (ref 80.0–100.0)
Monocytes Absolute: 1.1 10*3/uL — ABNORMAL HIGH (ref 0.1–1.0)
Monocytes Relative: 7 %
Neutro Abs: 14.3 10*3/uL — ABNORMAL HIGH (ref 1.7–7.7)
Neutrophils Relative %: 87 %
Platelets: 249 10*3/uL (ref 150–400)
RBC: 3.73 MIL/uL — ABNORMAL LOW (ref 3.87–5.11)
RDW: 17.6 % — ABNORMAL HIGH (ref 11.5–15.5)
WBC: 16.3 10*3/uL — ABNORMAL HIGH (ref 4.0–10.5)
nRBC: 0 % (ref 0.0–0.2)

## 2020-11-29 LAB — RESP PANEL BY RT-PCR (FLU A&B, COVID) ARPGX2
Influenza A by PCR: NEGATIVE
Influenza B by PCR: NEGATIVE
SARS Coronavirus 2 by RT PCR: NEGATIVE

## 2020-11-29 LAB — COMPREHENSIVE METABOLIC PANEL
ALT: 41 U/L (ref 0–44)
AST: 36 U/L (ref 15–41)
Albumin: 3.6 g/dL (ref 3.5–5.0)
Alkaline Phosphatase: 60 U/L (ref 38–126)
Anion gap: 13 (ref 5–15)
BUN: 44 mg/dL — ABNORMAL HIGH (ref 8–23)
CO2: 25 mmol/L (ref 22–32)
Calcium: 9.5 mg/dL (ref 8.9–10.3)
Chloride: 99 mmol/L (ref 98–111)
Creatinine, Ser: 2.2 mg/dL — ABNORMAL HIGH (ref 0.44–1.00)
GFR, Estimated: 22 mL/min — ABNORMAL LOW (ref >60)
Glucose, Bld: 196 mg/dL — ABNORMAL HIGH (ref 70–99)
Potassium: 3.9 mmol/L (ref 3.5–5.1)
Sodium: 137 mmol/L (ref 135–145)
Total Bilirubin: 0.9 mg/dL (ref 0.3–1.2)
Total Protein: 6.9 g/dL (ref 6.5–8.1)

## 2020-11-29 LAB — TROPONIN I (HIGH SENSITIVITY)
Troponin I (High Sensitivity): 9 ng/L (ref <18)
Troponin I (High Sensitivity): 9 ng/L (ref <18)

## 2020-11-29 LAB — D-DIMER, QUANTITATIVE: D-Dimer, Quant: 4.69 ug/mL-FEU — ABNORMAL HIGH (ref 0.00–0.50)

## 2020-11-29 MED ORDER — FENTANYL CITRATE (PF) 100 MCG/2ML IJ SOLN
100.0000 ug | Freq: Once | INTRAMUSCULAR | Status: AC
  Administered 2020-11-29: 100 ug via INTRAVENOUS
  Filled 2020-11-29: qty 2

## 2020-11-29 MED ORDER — FENTANYL CITRATE (PF) 100 MCG/2ML IJ SOLN
50.0000 ug | Freq: Once | INTRAMUSCULAR | Status: AC | PRN
Start: 2020-11-29 — End: 2020-11-29
  Administered 2020-11-29: 50 ug via INTRAVENOUS
  Filled 2020-11-29: qty 2

## 2020-11-29 MED ORDER — ONDANSETRON HCL 4 MG/2ML IJ SOLN
4.0000 mg | Freq: Once | INTRAMUSCULAR | Status: AC
  Administered 2020-11-29: 4 mg via INTRAVENOUS
  Filled 2020-11-29: qty 2

## 2020-11-29 NOTE — ED Notes (Signed)
[facility] notified that pt needs VQ scan

## 2020-11-29 NOTE — ED Triage Notes (Signed)
Pt arrived ems c/o n/v, chills onset 0900 this am,  Some shortness of breath pain from waist to chest,  States had not had flu or covid shots, due to reaction to flu shot in past,  covid is in home where she lives

## 2020-11-29 NOTE — ED Notes (Signed)
Pt on monitor and vitals cycling 

## 2020-11-29 NOTE — ED Provider Notes (Signed)
Care assumed from Dr. Karle Starch, patient with elevated D-dimer pending VQ scan.  VQ scan shows no evidence of pulmonary embolism.  In addition, patient is on chronic anticoagulation at baseline.  Nausea has improved, but she still having some mild nausea.  She is discharged with prescription for ondansetron, follow-up with PCP.  Results for orders placed or performed during the hospital encounter of 11/29/20  Resp Panel by RT-PCR (Flu A&B, Covid) Nasopharyngeal Swab   Specimen: Nasopharyngeal Swab; Nasopharyngeal(NP) swabs in vial transport medium  Result Value Ref Range   SARS Coronavirus 2 by RT PCR NEGATIVE NEGATIVE   Influenza A by PCR NEGATIVE NEGATIVE   Influenza B by PCR NEGATIVE NEGATIVE  Comprehensive metabolic panel  Result Value Ref Range   Sodium 137 135 - 145 mmol/L   Potassium 3.9 3.5 - 5.1 mmol/L   Chloride 99 98 - 111 mmol/L   CO2 25 22 - 32 mmol/L   Glucose, Bld 196 (H) 70 - 99 mg/dL   BUN 44 (H) 8 - 23 mg/dL   Creatinine, Ser 2.20 (H) 0.44 - 1.00 mg/dL   Calcium 9.5 8.9 - 10.3 mg/dL   Total Protein 6.9 6.5 - 8.1 g/dL   Albumin 3.6 3.5 - 5.0 g/dL   AST 36 15 - 41 U/L   ALT 41 0 - 44 U/L   Alkaline Phosphatase 60 38 - 126 U/L   Total Bilirubin 0.9 0.3 - 1.2 mg/dL   GFR, Estimated 22 (L) >60 mL/min   Anion gap 13 5 - 15  Lipase, blood  Result Value Ref Range   Lipase 36 11 - 51 U/L  CBC with Differential  Result Value Ref Range   WBC 16.3 (H) 4.0 - 10.5 K/uL   RBC 3.73 (L) 3.87 - 5.11 MIL/uL   Hemoglobin 10.6 (L) 12.0 - 15.0 g/dL   HCT 33.3 (L) 36.0 - 46.0 %   MCV 89.3 80.0 - 100.0 fL   MCH 28.4 26.0 - 34.0 pg   MCHC 31.8 30.0 - 36.0 g/dL   RDW 17.6 (H) 11.5 - 15.5 %   Platelets 249 150 - 400 K/uL   nRBC 0.0 0.0 - 0.2 %   Neutrophils Relative % 87 %   Neutro Abs 14.3 (H) 1.7 - 7.7 K/uL   Lymphocytes Relative 5 %   Lymphs Abs 0.8 0.7 - 4.0 K/uL   Monocytes Relative 7 %   Monocytes Absolute 1.1 (H) 0.1 - 1.0 K/uL   Eosinophils Relative 0 %   Eosinophils  Absolute 0.0 0.0 - 0.5 K/uL   Basophils Relative 0 %   Basophils Absolute 0.0 0.0 - 0.1 K/uL   Immature Granulocytes 1 %   Abs Immature Granulocytes 0.08 (H) 0.00 - 0.07 K/uL  D-dimer, quantitative (not at [facility])  Result Value Ref Range   D-Dimer, Quant 4.69 (H) 0.00 - 0.50 ug/mL-FEU  Troponin I (High Sensitivity)  Result Value Ref Range   Troponin I (High Sensitivity) 9 <18 ng/L  Troponin I (High Sensitivity)  Result Value Ref Range   Troponin I (High Sensitivity) 9 <18 ng/L   CT Chest Wo Contrast  Result Date: 11/29/2020 CLINICAL DATA:  Severe pain radiating to back with emesis, COVID exposure EXAM: CT CHEST WITHOUT CONTRAST TECHNIQUE: Multidetector CT imaging of the chest was performed following the standard protocol without IV contrast. COMPARISON:  CT 01/28/2018 FINDINGS: Cardiovascular: The lack of intravenous contrast material significantly limits the evaluation of the cardiovascular structures particularly precluding luminal assessment. The aortic root is suboptimally  assessed given cardiac pulsation artifact. Extensive atherosclerotic plaque throughout the normal caliber thoracic aorta. No hyperdense mural thickening or plaque displacement is present to suggest development of an intramural hematoma. No periaortic stranding or hemorrhage. Trace pericardial fluid within the pericardial recesses is likely within physiologic normal. Cardiomegaly is similar to comparison with some biatrial enlargement. Cardiac pacer pack overlies left chest wall with leads directed towards the right atrium, apex and coronary sinus. Central pulmonary arteries are borderline enlarged though similar to comparison examination and may reflect some chronic pulmonary artery hypertension. Luminal assessment precluded in the absence of contrast media. No major venous abnormalities. Mediastinum/Nodes: Trace fluid in the pericardial recesses, as above, likely physiologic normal. Heterogeneous and multinodular thyroid with  largest nodules measuring up to 1.7 cm in the right lobe and 1.8 cm in the left lobe. Thoracic inlet otherwise unremarkable. No acute abnormality of the trachea. Some mild thickening of the distal thoracic esophagus with adjacent hazy stranding. No extraluminal gas or free fluid to suggest perforation. Scattered calcified mediastinal and hilar nodes, largest are some coarse calcifications within the AP window measuring up to 16 mm in short axis. Appearance is similar to prior. No new or enlarging mediastinal or axillary adenopathy with hilar evaluation limited in the absence of contrast media. Lungs/Pleura: Background of centrilobular and paraseptal emphysema as well as some diffuse airways thickening and scattered secretions compatible with bronchitic features. Some additional bandlike areas of scarring and architectural distortion are noted in the lung bases with associated bronchiectatic features. Trace right and small left pleural effusion with adjacent areas of passive atelectasis. Scattered calcified granulomata present within the lungs. No pneumothorax. No other focal airspace disease. Minimal pulmonary vascular congestion. Upper Abdomen: Scattered calcifications throughout the spleen compatible with sequela of prior granulomatous disease. Multiple fluid attenuation cysts in both kidneys including a slightly enlarging though simple fluid attenuation cyst present in the upper pole left kidney now measuring up to 3.4 cm (2/146) previously 2.5 cm. Extensive upper abdominal atherosclerosis. No acute abnormalities present in the visualized portions of the upper abdomen. Musculoskeletal: The osseous structures appear diffusely demineralized which may limit detection of small or nondisplaced fractures. Remote anterior wedging compression deformity involving the L1 superior endplate with approximately 20% height loss, unchanged from comparison imaging. IMPRESSION: 1. The lack of intravenous contrast material  significantly limits the evaluation of the cardiovascular structures particularly luminal assessment of the aorta and arteries. 2. No worrisome hyperdense mural thickening or plaque displacement within the aorta to suggest intramural hematoma. No periaortic stranding or hemorrhage. 3. Some chronic dilatation of the central pulmonary arteries, may reflect pulmonary artery hypertension likely secondary to underlying parenchymal lung disease with emphysema and sequela of prior granulomatous disease. If there is persisting concern for pulmonary embolism, consider V/Q scan given patient's diminished renal function. 4. Some mild thickening of the distal thoracic esophagus with adjacent hazy stranding, suggestive of esophagitis or sequela of protracted emesis. No extraluminal gas or free fluid to suggest perforation. Further correlation could be made with direct visualization as clinically warranted. 5. Trace right and small left pleural effusions with adjacent areas of passive atelectasis. Some additional bandlike opacities lung bases could reflect subsegmental atelectasis versus architectural distortion and scarring some bronchitic and bronchiectatic features as well. 6. Sequela of prior granulomatous disease in the chest and abdomen as described above. 7. Heterogeneous and multinodular thyroid with largest nodules measuring up to 1.7 cm in the right lobe and 1.8 cm in the left lobe. Recommend thyroid US (ref: J Am  Coll Radiol. 2015 Feb;12(2): 143-50). 8. Remote L1 superior endplate compression deformity with approximately 20% height loss. 9. Aortic Atherosclerosis (ICD10-I70.0) 10. Emphysema (ICD10-J43.9). These results were called by telephone at the time of interpretation on 11/29/2020 at 7:05 pm to provider ABIGAIL HARRIS , who verbally acknowledged these results. Electronically Signed   By: Lovena Le M.D.   On: 11/29/2020 19:05   NM Pulmonary Perfusion  Result Date: 11/30/2020 CLINICAL DATA:  Pleuritic chest  pain EXAM: NUCLEAR MEDICINE PERFUSION LUNG SCAN TECHNIQUE: Perfusion images were obtained in multiple projections after intravenous injection of radiopharmaceutical. Ventilation scans intentionally deferred if perfusion scan and chest x-ray adequate for interpretation during COVID 19 epidemic. RADIOPHARMACEUTICALS:  4.32 mCi Tc-3m MAA IV COMPARISON:  CT same day FINDINGS: There is slightly decreased radiotracer uptake seen at both lung bases, likely due to the small pleural effusions seen on chest CT. Otherwise normal radiotracer uptake seen throughout both lungs. IMPRESSION: Probable small bilateral pleural effusions as on CT same day, otherwise normal examination. Electronically Signed   By: Prudencio Pair M.D.   On: 11/30/2020 01:13   DG Abd Acute W/Chest  Result Date: 11/29/2020 CLINICAL DATA:  Chest and abdominal pain, short of breath, nausea and vomiting, chills EXAM: DG ABDOMEN ACUTE WITH 1 VIEW CHEST COMPARISON:  08/18/2019 FINDINGS: Supine and upright frontal views of the abdomen as well as an upright frontal view of the chest are obtained. Stable multi lead pacer. Cardiac silhouette is unchanged. Extensive atherosclerosis of the thoracic aorta. Calcified lymph node in the AP window unchanged. No airspace disease, effusion, or pneumothorax. There is a relative paucity of bowel gas, with no evidence of high-grade obstruction or ileus. No masses or abnormal calcifications. Extensive vascular calcifications throughout the aorta and its branches. No acute bony abnormalities. IMPRESSION: 1. No acute intrathoracic process. 2. Unremarkable bowel gas pattern. 3. Extensive atherosclerosis. Electronically Signed   By: Randa Ngo M.D.   On: 11/29/2020 15:30   CUP PACEART REMOTE DEVICE CHECK  Result Date: 11/02/2020 Scheduled remote reviewed. Normal device function.  Next remote 91 days. HB    Delora Fuel, MD 76/16/07 [phone]

## 2020-11-29 NOTE — ED Notes (Signed)
Spoke with Maggie in lab to add on D-dimer

## 2020-11-29 NOTE — ED Provider Notes (Signed)
Care of the patient assumed on transfer from [facility] for V/Q scan, she was having epigastric/chest pain, radiating to her back with N/V. CT was neg, but dimer was elevated. She has CKD and cannot have IV contrast. Denies any current pain.   Physical Exam  BP (!) 140/48   Pulse 70   Temp 98.8 F (37.1 C) (Oral)   Resp 16   Ht 5\' 5"  (1.651 m)   Wt 67.6 kg   SpO2 96%   BMI 24.79 kg/m   Physical Exam Awake and Alert Resting Comfortably Respirations even and unlabored ED Course/Procedures   Clinical Course as of 11/29/20 2120  Tue Nov 29, 2020  1449 ED EKG EKG does not show pattern of acute ischemia [AH]  1700 D-dimer, quantitative (not at [facility])(!) Patient D-dimer is significantly elevated.  I discussed the case with Dr. Junious Dresser of radiology.  Renal function will not tolerate contrasted study.  Asked if there be any  value to getting a noncontrast chest CT and we may be able to see a flap if there is a dissection.  She does not have an elevated troponin.  If this is negative then we plan to send the patient to [facility] for a VQ scan. [AH]    Clinical Course User Index [AH] Margarita Mail, PA-C    Procedures  MDM  V/Q ordered, patient denies any current needs.   11:05 PM Care of the patient signed out to Dr. Roxanne Mins at the change of shift pending V/Q      Truddie Hidden, MD 11/29/20 2306

## 2020-11-29 NOTE — ED Notes (Signed)
Per EDP order, pt given fluids and/or food for PO challenge. Pt verbalized understanding to utilize call bell if nausea or emesis occur. 

## 2020-11-29 NOTE — ED Provider Notes (Signed)
[facility] EMERGENCY DEPARTMENT Provider Note   CSN: [id number] Arrival date & time: 11/29/20  1350     History Chief Complaint  Patient presents with  . Emesis    Krista Mason is a 85 y.o. female who presents emergency department for vomiting and chest pain.  Patient has a past medical history of chronic GERD, hypertension, nonischemic cardiomyopathy, diabetes, CKD, A. fib on Eliquis, peripheral vascular disease, CHF.  She is status post biventricular pacemaker by Dr. Curt Bears and has a history of endoscopic balloon dilation as well as previous coronary catheterization and previous echocardiogram.  Last heart cath showed heavily calcified coronary arteries without obstructive CAD as well as 60 to 65% EF. Patient states that when she had sudden onset of severe pain just behind the inferior part of her sternum.  She describes the pain as sharp, radiating straight to her back.  She had onset of episodes of profuse vomiting which was at times dark brown and at other times clear.  She states she vomited and had severe pain for about an hour before she was able to fall asleep for a while.  She woke back up around 10 AM with onset of the same pain however she states that the pain is now all consuming from her waist up and radiates down both of her arms.  She states that she has had pain like this in the past however it has always gone away.  Her nausea is controlled at this time however she continues to have the severe pain and states it is worse when she takes a deep breath.  She also felt chilled today.  She is not vaccinated for coronavirus.  She denies fever or body aches.  HPI     Past Medical History:  Diagnosis Date  . Anemia   . Atrial fibrillation ([place])   . Chronic combined systolic and diastolic CHF (congestive heart failure) ([place])    a. LV dysfcuntion felt to be related to PVCs  . Chronic kidney disease   . Diabetes mellitus without complication ([place])   . GERD  (gastroesophageal reflux disease)   . Hx of echocardiogram    Echo (1/16):  EF 60-65%, no RWMA, Gr 1 DD, mod AI, mod MR, mild LAE  . Hyperkalemia   . Hyperlipidemia   . Hypertension   . NICM (nonischemic cardiomyopathy) ([place])    a. 12/2018 Echo: EF 35%, diff HK w/ septal-lateral dyssynchrony. Nl RV fxn; b. 07/2019 s/p MDT [id number] [name] CRT-P MRI SureScan device (ser #: [id number])  . Orthostatic hypotension   . PVC (premature ventricular contraction)    a. s/p PVC ablation in 11/2016  . Rheumatic fever   . Wears dentures     Patient Active Problem List   Diagnosis Date Noted  . Paroxysmal atrial fibrillation ([place]) 09/10/2019  . Secondary hypercoagulable state ([place]) 09/10/2019  . Gastrointestinal hemorrhage with melena 08/18/2019  . UGI bleed 08/18/2019  . Nonischemic cardiomyopathy ([place]) 07/24/2019  . Acute hypoxemic respiratory failure ([place])   . COPD  GOLD 0  01/17/2017  . Chronic systolic CHF (congestive heart failure) ([place]) 11/13/2016  . PVC (premature ventricular contraction) 11/13/2016  . Mixed hyperlipidemia 11/05/2016  . CKD (chronic kidney disease), stage III ([place]) 11/05/2016  . Tobacco abuse 11/05/2016  . Multifocal PVCs 11/05/2016  . Moderate mitral regurgitation 11/05/2016  . Microcytic anemia 11/05/2016  . Diabetes mellitus with complication ([place])   . Uncontrolled hypertension 06/18/2016  . Multifocal atrial tachycardia ([place]) 11/26/2014  .  PVD (peripheral vascular disease) ([place]) 12/15/2012    Past Surgical History:  Procedure Laterality Date  . BALLOON DILATION N/A 07/12/2020   Procedure: BALLOON DILATION;  Surgeon: Ronald Lobo, MD;  Location: [facility];  Service: Endoscopy;  Laterality: N/A;  . BIOPSY  08/20/2019   Procedure: BIOPSY;  Surgeon: Otis Brace, MD;  Location: [facility];  Service: Gastroenterology;;  . BIV PACEMAKER INSERTION CRT-P N/A 07/24/2019   Procedure: BIV PACEMAKER INSERTION CRT-P;  Surgeon: Constance Haw, MD;   Location: [facility];  Service: Cardiovascular;  Laterality: N/A;  . COLONOSCOPY WITH PROPOFOL N/A 08/22/2019   Procedure: COLONOSCOPY WITH PROPOFOL;  Surgeon: Laurence Spates, MD;  Location: [facility];  Service: Endoscopy;  Laterality: N/A;  . ESOPHAGOGASTRODUODENOSCOPY (EGD) WITH PROPOFOL N/A 09/25/2018   Procedure: ESOPHAGOGASTRODUODENOSCOPY (EGD) WITH PROPOFOL;  Surgeon: Ronald Lobo, MD;  [facility];  Service: Endoscopy;  Laterality: N/A;  . ESOPHAGOGASTRODUODENOSCOPY (EGD) WITH PROPOFOL N/A 10/29/2018   Procedure: ESOPHAGOGASTRODUODENOSCOPY (EGD) WITH PROPOFOL;  Surgeon: Ronald Lobo, MD;  [facility];  Service: Endoscopy;  Laterality: N/A;  . ESOPHAGOGASTRODUODENOSCOPY (EGD) WITH PROPOFOL N/A 08/20/2019   Procedure: ESOPHAGOGASTRODUODENOSCOPY (EGD) WITH PROPOFOL;  Surgeon: Otis Brace, MD;  Location: [facility];  Service: Gastroenterology;  Laterality: N/A;  . ESOPHAGOGASTRODUODENOSCOPY (EGD) WITH PROPOFOL N/A 05/03/2020   Procedure: ESOPHAGOGASTRODUODENOSCOPY (EGD) WITH PROPOFOL;  Surgeon: Ronald Lobo, MD;  [facility];  Service: Endoscopy;  Laterality: N/A;  . ESOPHAGOGASTRODUODENOSCOPY (EGD) WITH PROPOFOL N/A 06/02/2020   Procedure: ESOPHAGOGASTRODUODENOSCOPY (EGD) WITH PROPOFOL and Savory Dilatation;  Surgeon: Clarene Essex, MD;  [facility];  Service: Gastroenterology;  Laterality: N/A;  . ESOPHAGOGASTRODUODENOSCOPY (EGD) WITH PROPOFOL N/A 07/12/2020   Procedure: ESOPHAGOGASTRODUODENOSCOPY (EGD) WITH PROPOFOL with dilatation;  Surgeon: Ronald Lobo, MD;  [facility];  Service: Endoscopy;  Laterality: N/A;  . HEMOSTASIS CLIP PLACEMENT  08/22/2019   Procedure: HEMOSTASIS CLIP PLACEMENT;  Surgeon: Laurence Spates, MD;  Location: [facility] ENDOSCOPY;  Service: Endoscopy;;  . KNEE SURGERY Left 2009  . OVARIAN CYST REMOVAL    . POLYPECTOMY  08/22/2019   Procedure: POLYPECTOMY;  Surgeon: Laurence Spates, MD;  Location: [facility];  Service: Endoscopy;;  . PVC ABLATION N/A 11/30/2016   Procedure: PVC Ablation;  Surgeon: Will [name], MD;  Location: [facility];  Service: Cardiovascular;  Laterality: N/A;  . RIGHT HEART CATH N/A 01/31/2018   Procedure: RIGHT HEART CATH;  Surgeon: Jolaine Artist, MD;  Location: [facility];  Service: Cardiovascular;  Laterality: N/A;  . RIGHT/LEFT HEART CATH AND CORONARY ANGIOGRAPHY N/A 01/25/2017   Procedure: Right/Left Heart Cath and Coronary Angiography;  Surgeon: Jolaine Artist, MD;  Location: [facility];  Service: Cardiovascular;  Laterality: N/A;  . SAVORY DILATION N/A 09/25/2018   Procedure: SAVORY DILATION;  Surgeon: Ronald Lobo, MD;  [facility];  Service: Endoscopy;  Laterality: N/A;  . SAVORY DILATION N/A 10/29/2018   Procedure: SAVORY DILATION;  Surgeon: Ronald Lobo, MD;  [facility];  Service: Endoscopy;  Laterality: N/A;  . SAVORY DILATION N/A 08/20/2019   Procedure: SAVORY DILATION;  Surgeon: Otis Brace, MD;  [facility];  Service: Gastroenterology;  Laterality: N/A;  . SAVORY DILATION N/A 05/03/2020   Procedure: SAVORY DILATION;  Surgeon: Ronald Lobo, MD;  [facility];  Service: Endoscopy;  Laterality: N/A;  Patient needs ultraslim pediatric upper endoscope/     no fluoro per dr. Rayne Du  . SAVORY DILATION N/A 06/02/2020   Procedure: SAVORY DILATION w/ FLURO;  Surgeon: Clarene Essex, MD;  Location: [facility];  Service: Gastroenterology;  Laterality: N/A;     OB History   No obstetric history on file.     Family History  Problem Relation Age of Onset  . Heart attack Father   . Heart disease Father   . Cancer Father   . Hypertension Father   . Hypertension Brother   . Stroke Brother   . Hypertension Brother   . Diabetes Son   . Hyperlipidemia Son   . Hypertension Son     Social History   Tobacco Use  . Smoking status: Former Smoker    Packs/day: 0.75     Years: 60.00    Pack years: 45.00    Quit date: 11/04/2016    Years since quitting: 4.0  . Smokeless tobacco: Never Used  Vaping Use  . Vaping Use: Never used  Substance Use Topics  . Alcohol use: No  . Drug use: No    Home Medications Prior to Admission medications   Medication Sig Start Date End Date Taking? Authorizing Provider  acetaminophen (TYLENOL) 500 MG tablet Take 500 mg by mouth every 6 (six) hours as needed for moderate pain or headache.    [provider]  amiodarone (PACERONE) 100 MG tablet Take 1 tablet (100 mg total) by mouth daily. 06/20/20   Fenton, Clint R, PA  atorvastatin (LIPITOR) 20 MG tablet TAKE 1 TABLET BY MOUTH EVERY DAY 12/04/19   Bensimhon, Shaune Pascal, MD  Camphor-Eucalyptus-Menthol (VICKS VAPORUB EX) Apply 1 application topically daily as needed (toe fungus).    [provider]  carvedilol (COREG) 12.5 MG tablet Take 1 tablet (12.5 mg total) by mouth 2 (two) times daily with a meal. 05/04/20   Bensimhon, Shaune Pascal, MD  Cholecalciferol (VITAMIN D3) 50 MCG (2000 UT) TABS Take 2,000 Units by mouth daily.    [provider]  diphenhydrAMINE (BENADRYL) 25 mg capsule Take 25 mg by mouth every 6 (six) hours as needed for allergies.     [provider]  ELIQUIS 2.5 MG TABS tablet TAKE 1 TABLET BY MOUTH TWICE A DAY Patient taking differently: Take 2.5 mg by mouth in the morning and at bedtime.  06/14/20   Fenton, Clint R, PA  feeding supplement, ENSURE ENLIVE, (ENSURE ENLIVE) LIQD Take 237 mLs by mouth 2 (two) times daily between meals. Patient taking differently: Take 237 mLs by mouth every evening.  08/25/19   Mikhail, Velta Addison, DO  furosemide (LASIX) 20 MG tablet Take 20-40 mg by mouth See admin instructions. Alternate taking 1 tablet (20 mg) by mouth one day & take 2 tablets (40 mg) by mouth the next    [provider]  hydrALAZINE (APRESOLINE) 50 MG tablet TAKE 1 TABLET BY MOUTH THREE TIMES A DAY Patient taking differently:  Take 50 mg by mouth in the morning and at bedtime.  03/09/20   Camnitz, Will Hassell Done, MD  imiquimod Ler[place] Sea) 5 % cream Apply topically. 07/06/20   [provider]  pantoprazole (PROTONIX) 40 MG tablet Take 1 tablet (40 mg total) by mouth 2 (two) times daily before a meal. 08/25/19   Mikhail, Velta Addison, DO  Polyethyl Glycol-Propyl Glycol (SYSTANE OP) Place 1 drop into both eyes 3 (three) times daily as needed (dryness).     [provider]  polyethylene glycol (MIRALAX / GLYCOLAX) 17 g packet Take 17 g by mouth daily as needed (constipation.).    [provider]  vitamin B-12 (CYANOCOBALAMIN) 1000 MCG tablet Take 1  tablet (1,000 mcg total) by mouth daily. 08/25/19   Cristal Ford, DO    Allergies    Penicillins, Retacrit [epoetin (alfa)], Ceclor [cefaclor], Crestor [rosuvastatin calcium], Influenza vaccines, Nsaids, Percocet [oxycodone-acetaminophen], and Prednisone  Review of Systems   Review of Systems Ten systems reviewed and are negative for acute change, except as noted in the HPI.   Physical Exam Updated Vital Signs BP (!) 153/58   Pulse 70   Temp 98.7 F (37.1 C) (Oral)   Resp (!) 22   Ht 5\' 5"  (1.651 m)   Wt 67.6 kg   SpO2 98%   BMI 24.79 kg/m   Physical Exam Vitals and nursing note reviewed.  Constitutional:      Appearance: She is ill-appearing.  HENT:     Head: Normocephalic and atraumatic.     Right Ear: Tympanic membrane normal.     Left Ear: Tympanic membrane normal.     Nose: Nose normal.     Mouth/Throat:     Mouth: Mucous membranes are dry.  Eyes:     Extraocular Movements: Extraocular movements intact.     Pupils: Pupils are equal, round, and reactive to light.  Cardiovascular:     Rate and Rhythm: Normal rate and regular rhythm.     Pulses: Normal pulses.  Pulmonary:     Breath sounds: Normal breath sounds and air entry.     Comments: Breathing is guarded Abdominal:     General: Abdomen is flat.     Palpations: Abdomen is  soft.     Tenderness: There is abdominal tenderness in the epigastric area.    Musculoskeletal:        General: No swelling. Normal range of motion.     Cervical back: Normal range of motion and neck supple.  Skin:    General: Skin is warm and dry.  Neurological:     General: No focal deficit present.     Mental Status: She is alert and oriented to person, place, and time.     ED Results / Procedures / Treatments   Labs (all labs ordered are listed, but only abnormal results are displayed) Labs Reviewed  RESP PANEL BY RT-PCR (FLU A&B, COVID) [id number]  COMPREHENSIVE METABOLIC PANEL  LIPASE, BLOOD  CBC WITH DIFFERENTIAL/PLATELET  URINALYSIS, ROUTINE W REFLEX MICROSCOPIC  TROPONIN I (HIGH SENSITIVITY)    EKG None  Radiology No results found.  Procedures Procedures   Medications Ordered in ED Medications  fentaNYL (SUBLIMAZE) injection 100 mcg (has no administration in time range)  ondansetron (ZOFRAN) injection 4 mg (has no administration in time range)    ED Course  I have reviewed the triage vital signs and the nursing notes.  Pertinent labs & imaging results that were available during my care of the patient were reviewed by me and considered in my medical decision making (see chart for details).  Clinical Course as of 11/29/20 2039  Tue Nov 29, 2020  1449 ED EKG EKG does not show pattern of acute ischemia [AH]  1700 D-dimer, quantitative (not at [facility])(!) Patient D-dimer is significantly elevated.  I discussed the case with Dr. Junious Dresser of radiology.  Renal function will not tolerate contrasted study.  Asked if there be any  value to getting a noncontrast chest CT and we may be able to see a flap if there is a dissection.  She does not have an elevated troponin.  If this is negative then we plan to send the patient to [facility] for a VQ  scan. [AH]    Clinical Course User Index [AH] Margarita Mail, PA-C   MDM Rules/Calculators/A&P                         Krista Mason  was evaluated in Emergency Department on 11/29/2020 for the symptoms described in the history of present illness. She was evaluated in the context of the global COVID-19 pandemic, which necessitated consideration that the patient might be at risk for infection with the SARS-CoV-2 virus that causes COVID-19. Institutional protocols and algorithms that pertain to the evaluation of patients at risk for COVID-19 are in a state of rapid change based on information released by regulatory bodies including the CDC and federal and state organizations. These policies and algorithms were followed during the patient's care in the ED.   2:37 PM BP (!) 153/58   Pulse 70   Temp 98.7 F (37.1 C) (Oral)   Resp (!) 22   Ht 5\' 5"  (1.651 m)   Wt 67.6 kg   SpO2 98%   BMI 24.79 kg/m  Patient here with severe pain in the epigastric region/ lower chest region. DDX is complex but includes Aortic dissection, ACS, PE, Gastric volvulus, Boerhaave's syndrome, lower lobe pneumonia. Less like to be perforated viscous as patient does not have an acute abdomen of physical exam.  I have ordered labs including a Covid test, lipase, CBC, troponin, CMP and urinalysis. I have ordered images including an acute abdomen with chest and a pending EKG.  We will consider imaging including CT angiogram of the chest abdomen   Patient's labs have returned and her GFR is too low for contrasted imaging.  I contacted radiology discussed if a noncontrasted CT would let us see anything and it would allow [place] to see a large aortic dissection flap or mural wall hematoma.  I have ordered a CT chest.  I have reviewed the patient's labs which shows an elevated white blood cell count likely due to acute phase reaction.  Her respiratory panel is negative for Covid troponin within normal limits, CMP shows baseline creatinine slightly elevated blood glucose lipase within normal limits D-dimer is markedly elevated at 4.69 with no previous to compare.  Patient  CT shows no significant abnormalities.  I discussed the case with Dr. Lovena Le via telephone call.  She does not have any obvious evidence of dissection and her blood pressure has been controlled here in the emergency department with no further pain.  Given patient's significantly elevated D-dimer we will need to obtain a VQ scan.  I placed the orders for the VQ scan.  I have updated the patient on this and will transfer the patient to [facility] for imaging.  I discussed the case with Dr. Mali Sheldon who is excepted the patient.  Patient continues to do well throughout her ED course here in the emergency department Final Clinical Impression(s) / ED Diagnoses Final diagnoses:  None    Rx / DC Orders ED Discharge Orders    None       Margarita Mail, PA-C 11/29/20 2043    Lajean Saver, MD 11/29/20 2112

## 2020-11-30 DIAGNOSIS — R0781 Pleurodynia: Secondary | ICD-10-CM | POA: Diagnosis not present

## 2020-11-30 MED ORDER — TECHNETIUM TO 99M ALBUMIN AGGREGATED
4.3200 | Freq: Once | INTRAVENOUS | Status: AC | PRN
  Administered 2020-11-30: 4.32 via INTRAVENOUS

## 2020-11-30 MED ORDER — ONDANSETRON HCL 4 MG PO TABS: 4.0000 mg | ORAL_TABLET | Freq: Four times a day (QID) | ORAL | 0 refills | Status: DC | PRN

## 2020-12-06 ENCOUNTER — Other Ambulatory Visit: Payer: Self-pay

## 2020-12-06 ENCOUNTER — Encounter (HOSPITAL_COMMUNITY): Payer: Medicare HMO

## 2020-12-06 ENCOUNTER — Encounter (HOSPITAL_COMMUNITY)
Admission: RE | Admit: 2020-12-06 | Discharge: 2020-12-06 | Disposition: A | Discharge status: Home / Self Care | Payer: Medicare HMO | Source: Ambulatory Visit | Attending: Nephrology | Admitting: Nephrology

## 2020-12-06 DIAGNOSIS — D631 Anemia in chronic kidney disease: Secondary | ICD-10-CM | POA: Diagnosis not present

## 2020-12-06 DIAGNOSIS — N183 Chronic kidney disease, stage 3 unspecified: Secondary | ICD-10-CM | POA: Diagnosis not present

## 2020-12-06 MED ORDER — SODIUM CHLORIDE 0.9 % IV SOLN
510.0000 mg | INTRAVENOUS | Status: AC
  Administered 2020-12-06: 510 mg via INTRAVENOUS
  Filled 2020-12-06: qty 510

## 2020-12-12 ENCOUNTER — Encounter: Payer: Self-pay | Admitting: Cardiology

## 2020-12-12 ENCOUNTER — Other Ambulatory Visit: Payer: Self-pay

## 2020-12-12 ENCOUNTER — Ambulatory Visit: Payer: Medicare HMO | Admitting: Cardiology

## 2020-12-12 VITALS — BP 116/74 | HR 70 | Ht 65.0 in | Wt 154.2 lb

## 2020-12-12 DIAGNOSIS — I5022 Chronic systolic (congestive) heart failure: Secondary | ICD-10-CM

## 2020-12-12 DIAGNOSIS — Z79899 Other long term (current) drug therapy: Secondary | ICD-10-CM | POA: Diagnosis not present

## 2020-12-12 DIAGNOSIS — I428 Other cardiomyopathies: Secondary | ICD-10-CM

## 2020-12-12 LAB — TSH: TSH: 1.49 u[IU]/mL (ref 0.450–4.500)

## 2020-12-12 MED ORDER — AMIODARONE HCL 200 MG PO TABS: 100.0000 mg | ORAL_TABLET | ORAL | 3 refills | Status: DC

## 2020-12-12 NOTE — Progress Notes (Signed)
Electrophysiology Office Note   Date:  12/12/2020   ID:  Krista, Mason 1936-06-12, MRN [id number]  PCP:  Leighton Ruff, MD  Cardiologist:  Nah[facility] Primary Electrophysiologist:  Nyjae Hodge [name], MD    No chief complaint on file.    History of Present Illness: Krista Mason is a 85 y.o. female who presents today for electrophysiology evaluation.     She has a history of hypertension, PVCs, hyperlipidemia, diabetes, CKD, histoplasmosis, and tobacco abuse. She was diagnosed with CHF. Myoview was without ischemia. Holter monitor showed a PVC burden of 29%. She is status post PVC ablation 11/26/2016 with ablation inferior to the right coronary cusp. Ablation was complicated by left bundle branch block. She is now status post Medtronic CRT-P implanted 07/24/2019.  Today, denies symptoms of palpitations, chest pain, shortness of breath, orthopnea, PND, lower extremity edema, claudication, dizziness, presyncope, syncope, bleeding, or neurologic sequela. The patient is tolerating medications without difficulties.  Since last being seen she has done well without arrhythmia.  She unfortunately did present to the emergency room earlier this month with severe reflux.  She had a VQ scan which was negative.  Aside from that, she has no complaint other than frequent doctor's visits.   Past Medical History:  Diagnosis Date  . Anemia   . Atrial fibrillation ([place])   . Chronic combined systolic and diastolic CHF (congestive heart failure) ([place])    a. LV dysfcuntion felt to be related to PVCs  . Chronic kidney disease   . Diabetes mellitus without complication ([place])   . GERD (gastroesophageal reflux disease)   . Hx of echocardiogram    Echo (1/16):  EF 60-65%, no RWMA, Gr 1 DD, mod AI, mod MR, mild LAE  . Hyperkalemia   . Hyperlipidemia   . Hypertension   . NICM (nonischemic cardiomyopathy) ([place])    a. 12/2018 Echo: EF 35%, diff HK w/ septal-lateral dyssynchrony. Nl RV fxn; b. 07/2019 s/p  MDT [id number] [name] CRT-P MRI SureScan device (ser #: [id number])  . Orthostatic hypotension   . PVC (premature ventricular contraction)    a. s/p PVC ablation in 11/2016  . Rheumatic fever   . Wears dentures    Past Surgical History:  Procedure Laterality Date  . BALLOON DILATION N/A 07/12/2020   Procedure: BALLOON DILATION;  Surgeon: Ronald Lobo, MD;  Location: [facility];  Service: Endoscopy;  Laterality: N/A;  . BIOPSY  08/20/2019   Procedure: BIOPSY;  Surgeon: Otis Brace, MD;  Location: [facility];  Service: Gastroenterology;;  . BIV PACEMAKER INSERTION CRT-P N/A 07/24/2019   Procedure: BIV PACEMAKER INSERTION CRT-P;  Surgeon: Constance Haw, MD;  Location: [facility] [facility];  Service: Cardiovascular;  Laterality: N/A;  . COLONOSCOPY WITH PROPOFOL N/A 08/22/2019   Procedure: COLONOSCOPY WITH PROPOFOL;  Surgeon: Laurence Spates, MD;  Location: [facility];  Service: Endoscopy;  Laterality: N/A;  . ESOPHAGOGASTRODUODENOSCOPY (EGD) WITH PROPOFOL N/A 09/25/2018   Procedure: ESOPHAGOGASTRODUODENOSCOPY (EGD) WITH PROPOFOL;  Surgeon: Ronald Lobo, MD;  [facility];  Service: Endoscopy;  Laterality: N/A;  . ESOPHAGOGASTRODUODENOSCOPY (EGD) WITH PROPOFOL N/A 10/29/2018   Procedure: ESOPHAGOGASTRODUODENOSCOPY (EGD) WITH PROPOFOL;  Surgeon: Ronald Lobo, MD;  [facility];  Service: Endoscopy;  Laterality: N/A;  . ESOPHAGOGASTRODUODENOSCOPY (EGD) WITH PROPOFOL N/A 08/20/2019   Procedure: ESOPHAGOGASTRODUODENOSCOPY (EGD) WITH PROPOFOL;  Surgeon: Otis Brace, MD;  Location: [facility];  Service: Gastroenterology;  Laterality: N/A;  . ESOPHAGOGASTRODUODENOSCOPY (EGD) WITH PROPOFOL N/A 05/03/2020   Procedure: ESOPHAGOGASTRODUODENOSCOPY (EGD) WITH PROPOFOL;  Surgeon: Ronald Lobo, MD;  Location: [facility];  Service: Endoscopy;  Laterality: N/A;  . ESOPHAGOGASTRODUODENOSCOPY (EGD) WITH PROPOFOL N/A 06/02/2020   Procedure:  ESOPHAGOGASTRODUODENOSCOPY (EGD) WITH PROPOFOL and Savory Dilatation;  Surgeon: Clarene Essex, MD;  [facility];  Service: Gastroenterology;  Laterality: N/A;  . ESOPHAGOGASTRODUODENOSCOPY (EGD) WITH PROPOFOL N/A 07/12/2020   Procedure: ESOPHAGOGASTRODUODENOSCOPY (EGD) WITH PROPOFOL with dilatation;  Surgeon: Ronald Lobo, MD;  [facility];  Service: Endoscopy;  Laterality: N/A;  . HEMOSTASIS CLIP PLACEMENT  08/22/2019   Procedure: HEMOSTASIS CLIP PLACEMENT;  Surgeon: Laurence Spates, MD;  Location: [facility];  Service: Endoscopy;;  . KNEE SURGERY Left 2009  . OVARIAN CYST REMOVAL    . POLYPECTOMY  08/22/2019   Procedure: POLYPECTOMY;  Surgeon: Laurence Spates, MD;  Location: [facility];  Service: Endoscopy;;  . PVC ABLATION N/A 11/30/2016   Procedure: PVC Ablation;  Surgeon: Angelette Ganus [name], MD;  Location: [facility];  Service: Cardiovascular;  Laterality: N/A;  . RIGHT HEART CATH N/A 01/31/2018   Procedure: RIGHT HEART CATH;  Surgeon: Jolaine Artist, MD;  Location: [place] [facility];  Service: Cardiovascular;  Laterality: N/A;  . RIGHT/LEFT HEART CATH AND CORONARY ANGIOGRAPHY N/A 01/25/2017   Procedure: Right/Left Heart Cath and Coronary Angiography;  Surgeon: Jolaine Artist, MD;  Location: [facility] [facility];  Service: Cardiovascular;  Laterality: N/A;  . SAVORY DILATION N/A 09/25/2018   Procedure: SAVORY DILATION;  Surgeon: Ronald Lobo, MD;  [facility];  Service: Endoscopy;  Laterality: N/A;  . SAVORY DILATION N/A 10/29/2018   Procedure: SAVORY DILATION;  Surgeon: Ronald Lobo, MD;  [facility];  Service: Endoscopy;  Laterality: N/A;  . SAVORY DILATION N/A 08/20/2019   Procedure: SAVORY DILATION;  Surgeon: Otis Brace, MD;  Location[facility];  Service: Gastroenterology;  Laterality: N/A;  . SAVORY DILATION N/A 05/03/2020   Procedure: SAVORY DILATION;  Surgeon: Ronald Lobo, MD;  [facility];   Service: Endoscopy;  Laterality: N/A;  Patient needs ultraslim pediatric upper endoscope/     no fluoro per dr. Rayne Du  . SAVORY DILATION N/A 06/02/2020   Procedure: SAVORY DILATION w/ Burtis Junes;  Surgeon: Clarene Essex, MD;  Location[facility];  Service: Gastroenterology;  Laterality: N/A;     Current Outpatient Medications  Medication Sig Dispense Refill  . acetaminophen (TYLENOL) 500 MG tablet Take 500 mg by mouth every 6 (six) hours as needed for moderate pain or headache.    [name] amiodarone (PACERONE) 200 MG tablet Take 0.5 tablets (100 mg total) by mouth every other day. 45 tablet 3  . atorvastatin (LIPITOR) 20 MG tablet TAKE 1 TABLET BY MOUTH EVERY DAY 90 tablet 3  . carvedilol (COREG) 12.5 MG tablet Take 1 tablet (12.5 mg total) by mouth 2 (two) times daily with a meal. 60 tablet 11  . Cholecalciferol (VITAMIN D3) 50 MCG (2000 UT) TABS Take 2,000 Units by mouth daily.    . diphenhydrAMINE (BENADRYL) 25 mg capsule Take 25 mg by mouth every 6 (six) hours as needed for allergies.     [name] ELIQUIS 2.5 MG TABS tablet TAKE 1 TABLET BY MOUTH TWICE A DAY 60 tablet 6  . feeding supplement, ENSURE ENLIVE, (ENSURE ENLIVE) LIQD Take 237 mLs by mouth 2 (two) times daily between meals. 237 mL 12  . hydrALAZINE (APRESOLINE) 50 MG tablet Take 50 mg by mouth 2 (two) times daily.    . imiquimod (ALDARA) 5 % cream Apply topically.    . ondansetron (ZOFRAN)  4 MG tablet Take 1 tablet (4 mg total) by mouth every 6 (six) hours as needed for nausea or vomiting. 12 tablet 0  . pantoprazole (PROTONIX) 40 MG tablet Take 1 tablet (40 mg total) by mouth 2 (two) times daily before a meal. 60 tablet 1  . Polyethyl Glycol-Propyl Glycol (SYSTANE OP) Place 1 drop into both eyes 3 (three) times daily as needed (dryness).     . polyethylene glycol (MIRALAX / GLYCOLAX) 17 g packet Take 17 g by mouth daily as needed (constipation.).    [name] torsemide (DEMADEX) 20 MG tablet Take 20 mg by mouth daily.    . vitamin B-12  (CYANOCOBALAMIN) 1000 MCG tablet Take 1 tablet (1,000 mcg total) by mouth daily. 30 tablet 0   No current facility-administered medications for this visit.    Allergies:   Epoetin alfa, Penicillins, Retacrit [epoetin (alfa)], Ceclor [cefaclor], Crestor [rosuvastatin calcium], Influenza vaccines, Nsaids, Percocet [oxycodone-acetaminophen], and Prednisone   Social History:  The patient  reports that she quit smoking about 4 years ago. She has a 45.00 pack-year smoking history. She has never used smokeless tobacco. She reports that she does not drink alcohol and does not use drugs.   Family History:  The patient's family history includes Cancer in her father; Diabetes in her son; Heart attack in her father; Heart disease in her father; Hyperlipidemia in her son; Hypertension in her brother, brother, father, and son; Stroke in her brother.   ROS:  Please see the history of present illness.   Otherwise, review of systems is positive for none.   All other systems are reviewed and negative.   PHYSICAL EXAM: VS:  BP 116/74   Pulse 70   Ht 5\' 5"  (1.651 m)   Wt 154 lb 3.2 oz (69.9 kg)   SpO2 98%   BMI 25.66 kg/m  , BMI Body mass index is 25.66 kg/m. GEN: Well nourished, well developed, in no acute distress  HEENT: normal  Neck: no JVD, carotid bruits, or masses Cardiac: RRR; no murmurs, rubs, or gallops,no edema  Respiratory:  clear to auscultation bilaterally, normal work of breathing GI: soft, nontender, nondistended, + BS MS: no deformity or atrophy  Skin: warm and dry, device site well healed Neuro:  Strength and sensation are intact Psych: euthymic mood, full affect  EKG:  EKG is not ordered today. Personal review of the ekg ordered 11/30/20 shows sinus rhythm, ventricular paced  Personal review of the device interrogation today. Results in [facility].   Recent Labs: 06/16/2020: TSH 1.260 11/29/2020: ALT 41; BUN 44; Creatinine, Ser 2.20; Hemoglobin 10.6; Platelets 249; Potassium 3.9;  Sodium 137    Lipid Panel  No results found for: CHOL, TRIG, HDL, CHOLHDL, VLDL, LDLCALC, LDLDIRECT   Wt Readings from Last 3 Encounters:  12/12/20 154 lb 3.2 oz (69.9 kg)  11/29/20 149 lb (67.6 kg)  07/12/20 147 lb 14.9 oz (67.1 kg)      Other studies Reviewed: Additional studies/ records that were reviewed today include: TTE 01/01/19  Review of the above records today demonstrates:   1. The left ventricle has a visually estimated ejection fraction of 35%. The cavity size was normal. There is mildly increased left ventricular wall thickness. Left ventricular diastolic Doppler parameters are consistent with impaired relaxation.  Diffuse hypokinesis with septal-lateral dyssynchrony.  2. The right ventricle has normal systolic function. The cavity was normal. There is no increase in right ventricular wall thickness.  3. Left atrial size was mildly dilated.  4. Trivial  pericardial effusion is present.  5. Mild calcification of the mitral valve leaflet. Mitral valve regurgitation is mild to moderate by color flow Doppler. No evidence of mitral valve stenosis.  6. The aortic valve is tricuspid Mild calcification of the aortic valve. Aortic valve regurgitation is mild by color flow Doppler. no stenosis of the aortic valve.  7. The aortic root and ascending aorta are normal in size and structure.  8. Normal IVC size with PA systolic pressure 21 mmHg.   Holter 01/31/17 - Personally reviewed Frequent PACs and PVC with periods of wandering atrial pacemaker and several brief runs SVT.   PVC burden down to 2.1%  Cath 01/25/17 1. Heavily calcified coronaries with non-obstructive CAD 2. EF 45-50%  3, Normal hemodynamics 4. Significantly decreased PVC burden in cath lab from previous  ASSESSMENT AND PLAN:  1. PVCs: Status post ablation 11/26/2016. Burden decreased though not completely ablated. No changes.   2. Nonischemic cardiomyopathy: Ejection fraction 35 to 40%. Currently on optimal  medical therapy. Status post Medtronic CRT-D implanted 07/24/2019. Device functioning appropriately. No changes.    3. Hyperlipidemia: Continue Lipitor  4. Paroxysmal atrial fibrillation: Chads 2 vascular of 6. Currently on Eliquis.  She is also on amiodarone.  High risk medication monitoring.  She is having some involuntary shaking on amiodarone.  We Omie Ferger decrease to 100 mg every other day  Current medicines are reviewed at length with the patient today.   The patient does not have concerns regarding her medicines.  The following changes were made today: Decrease amiodarone  Labs/ tests ordered today include:  Orders Placed This Encounter  Procedures  . TSH     Disposition:   FU with Bradleigh Sonnen 6 months  Signed, Shadi Sessler [name], MD  12/12/2020 9:01 AM     [facility] [address] [place] [address] [phone] (office) [phone] (fax)

## 2020-12-12 NOTE — Patient Instructions (Signed)
Medication Instructions:  Your physician has recommended you make the following change in your medication:  1. DECREASE Amiodarone to 100 mg EVERY OTHER DAY  *If you need a refill on your cardiac medications before your next appointment, please call your pharmacy*   Lab Work: TSH lab today If you have labs (blood work) drawn today and your tests are completely normal, you will receive your results only by: [name] MyChart Message (if you have MyChart) OR . A paper copy in the mail If you have any lab test that is abnormal or we need to change your treatment, we will call you to review the results.   Testing/Procedures: None ordered   Follow-Up: At [facility], you and your health needs are our priority.  As part of our continuing mission to provide you with exceptional heart care, we have created designated Provider Care Teams.  These Care Teams include your primary Cardiologist (physician) and Advanced Practice Providers (APPs -  Physician Assistants and Nurse Practitioners) who all work together to provide you with the care you need, when you need it.  Your next appointment:   6 month(s)  The format for your next appointment:   In Person  Provider:   Allegra Lai, MD    Thank you for choosing [facility]!!   Trinidad Curet, RN [phone]

## 2020-12-13 ENCOUNTER — Encounter (HOSPITAL_COMMUNITY)
Admission: RE | Admit: 2020-12-13 | Discharge: 2020-12-13 | Disposition: A | Discharge status: Home / Self Care | Payer: Medicare HMO | Source: Ambulatory Visit | Attending: Nephrology | Admitting: Nephrology

## 2020-12-13 ENCOUNTER — Telehealth (HOSPITAL_COMMUNITY): Payer: Self-pay | Admitting: Pharmacy Technician

## 2020-12-13 VITALS — BP 110/49 | HR 70 | Temp 96.7°F | Resp 20

## 2020-12-13 DIAGNOSIS — D631 Anemia in chronic kidney disease: Secondary | ICD-10-CM | POA: Diagnosis not present

## 2020-12-13 DIAGNOSIS — N183 Chronic kidney disease, stage 3 unspecified: Secondary | ICD-10-CM

## 2020-12-13 LAB — FERRITIN: Ferritin: 633 ng/mL — ABNORMAL HIGH (ref 11–307)

## 2020-12-13 LAB — POCT HEMOGLOBIN-HEMACUE: Hemoglobin: 10.8 g/dL — ABNORMAL LOW (ref 12.0–15.0)

## 2020-12-13 LAB — IRON AND TIBC
Iron: 86 ug/dL (ref 28–170)
Saturation Ratios: 26 % (ref 10.4–31.8)
TIBC: 330 ug/dL (ref 250–450)
UIBC: 244 ug/dL

## 2020-12-13 MED ORDER — DARBEPOETIN ALFA 100 MCG/0.5ML IJ SOSY
100.0000 ug | PREFILLED_SYRINGE | INTRAMUSCULAR | Status: DC
  Administered 2020-12-13: 100 ug via SUBCUTANEOUS

## 2020-12-13 MED ORDER — DARBEPOETIN ALFA 100 MCG/0.5ML IJ SOSY
PREFILLED_SYRINGE | INTRAMUSCULAR | Status: AC
  Filled 2020-12-13: qty 0.5

## 2020-12-13 NOTE — Telephone Encounter (Signed)
Called [employer] to check the status of the patient's application. Representative stated that the patient needs to spend $562.32 in order to be approved for assistance.   Called and left the patient a message.  Charlann Boxer, CPhT

## 2020-12-23 DIAGNOSIS — I5040 Unspecified combined systolic (congestive) and diastolic (congestive) heart failure: Secondary | ICD-10-CM | POA: Diagnosis not present

## 2020-12-23 DIAGNOSIS — I5022 Chronic systolic (congestive) heart failure: Secondary | ICD-10-CM | POA: Diagnosis not present

## 2020-12-23 DIAGNOSIS — J449 Chronic obstructive pulmonary disease, unspecified: Secondary | ICD-10-CM | POA: Diagnosis not present

## 2020-12-23 DIAGNOSIS — E119 Type 2 diabetes mellitus without complications: Secondary | ICD-10-CM | POA: Diagnosis not present

## 2020-12-23 DIAGNOSIS — N184 Chronic kidney disease, stage 4 (severe): Secondary | ICD-10-CM | POA: Diagnosis not present

## 2020-12-23 DIAGNOSIS — I10 Essential (primary) hypertension: Secondary | ICD-10-CM | POA: Diagnosis not present

## 2020-12-23 DIAGNOSIS — D500 Iron deficiency anemia secondary to blood loss (chronic): Secondary | ICD-10-CM | POA: Diagnosis not present

## 2020-12-23 DIAGNOSIS — M810 Age-related osteoporosis without current pathological fracture: Secondary | ICD-10-CM | POA: Diagnosis not present

## 2020-12-23 DIAGNOSIS — E785 Hyperlipidemia, unspecified: Secondary | ICD-10-CM | POA: Diagnosis not present

## 2020-12-23 DIAGNOSIS — E1122 Type 2 diabetes mellitus with diabetic chronic kidney disease: Secondary | ICD-10-CM | POA: Diagnosis not present

## 2021-01-10 ENCOUNTER — Other Ambulatory Visit: Payer: Self-pay

## 2021-01-10 ENCOUNTER — Ambulatory Visit (HOSPITAL_COMMUNITY)
Admission: RE | Admit: 2021-01-10 | Discharge: 2021-01-10 | Disposition: A | Discharge status: Home / Self Care | Payer: Medicare HMO | Source: Ambulatory Visit | Attending: Nephrology | Admitting: Nephrology

## 2021-01-10 VITALS — BP 122/50 | HR 72 | Temp 96.8°F | Resp 20

## 2021-01-10 DIAGNOSIS — N183 Chronic kidney disease, stage 3 unspecified: Secondary | ICD-10-CM

## 2021-01-10 DIAGNOSIS — D631 Anemia in chronic kidney disease: Secondary | ICD-10-CM | POA: Diagnosis not present

## 2021-01-10 LAB — IRON AND TIBC
Iron: 70 ug/dL (ref 28–170)
Saturation Ratios: 20 % (ref 10.4–31.8)
TIBC: 343 ug/dL (ref 250–450)
UIBC: 273 ug/dL

## 2021-01-10 LAB — POCT HEMOGLOBIN-HEMACUE: Hemoglobin: 11.6 g/dL — ABNORMAL LOW (ref 12.0–15.0)

## 2021-01-10 LAB — FERRITIN: Ferritin: 123 ng/mL (ref 11–307)

## 2021-01-10 MED ORDER — DARBEPOETIN ALFA 100 MCG/0.5ML IJ SOSY
PREFILLED_SYRINGE | INTRAMUSCULAR | Status: AC
  Administered 2021-01-10: 100 ug
  Filled 2021-01-10: qty 0.5

## 2021-01-10 MED ORDER — DARBEPOETIN ALFA 100 MCG/0.5ML IJ SOSY: 100.0000 ug | PREFILLED_SYRINGE | INTRAMUSCULAR | Status: DC

## 2021-01-31 ENCOUNTER — Ambulatory Visit (INDEPENDENT_AMBULATORY_CARE_PROVIDER_SITE_OTHER): Payer: Medicare HMO

## 2021-01-31 DIAGNOSIS — I428 Other cardiomyopathies: Secondary | ICD-10-CM

## 2021-01-31 LAB — CUP PACEART REMOTE DEVICE CHECK
Battery Remaining Longevity: 105 mo
Battery Voltage: 3 V
Brady Statistic AP VP Percent: 96.68 %
Brady Statistic AP VS Percent: 0.05 %
Brady Statistic AS VP Percent: 3.02 %
Brady Statistic AS VS Percent: 0.25 %
Brady Statistic RA Percent Paced: 96.73 %
Brady Statistic RV Percent Paced: 99.7 %
Date Time Interrogation Session: 20220412044012
Implantable Lead Implant Date: 20201002
Implantable Lead Implant Date: 20201002
Implantable Lead Implant Date: 20201002
Implantable Lead Location: 753858
Implantable Lead Location: 753859
Implantable Lead Location: 753860
Implantable Lead Model: 4598
Implantable Lead Model: 5076
Implantable Lead Model: 5076
Implantable Pulse Generator Implant Date: 20201002
Lead Channel Impedance Value: 323 Ohm
Lead Channel Impedance Value: 342 Ohm
Lead Channel Impedance Value: 342 Ohm
Lead Channel Impedance Value: 361 Ohm
Lead Channel Impedance Value: 399 Ohm
Lead Channel Impedance Value: 418 Ohm
Lead Channel Impedance Value: 437 Ohm
Lead Channel Impedance Value: 513 Ohm
Lead Channel Impedance Value: 551 Ohm
Lead Channel Impedance Value: 570 Ohm
Lead Channel Impedance Value: 589 Ohm
Lead Channel Impedance Value: 608 Ohm
Lead Channel Impedance Value: 608 Ohm
Lead Channel Impedance Value: 608 Ohm
Lead Channel Pacing Threshold Amplitude: 0.5 V
Lead Channel Pacing Threshold Amplitude: 0.625 V
Lead Channel Pacing Threshold Amplitude: 1.75 V
Lead Channel Pacing Threshold Pulse Width: 0.4 ms
Lead Channel Pacing Threshold Pulse Width: 0.4 ms
Lead Channel Pacing Threshold Pulse Width: 0.4 ms
Lead Channel Sensing Intrinsic Amplitude: 1.5 mV
Lead Channel Sensing Intrinsic Amplitude: 1.5 mV
Lead Channel Sensing Intrinsic Amplitude: 31.625 mV
Lead Channel Sensing Intrinsic Amplitude: 31.625 mV
Lead Channel Setting Pacing Amplitude: 1.5 V
Lead Channel Setting Pacing Amplitude: 2 V
Lead Channel Setting Pacing Amplitude: 2 V
Lead Channel Setting Pacing Pulse Width: 0.4 ms
Lead Channel Setting Pacing Pulse Width: 0.4 ms
Lead Channel Setting Sensing Sensitivity: 1.2 mV

## 2021-02-07 ENCOUNTER — Ambulatory Visit (HOSPITAL_COMMUNITY)
Admission: RE | Admit: 2021-02-07 | Discharge: 2021-02-07 | Disposition: A | Discharge status: Home / Self Care | Payer: Medicare HMO | Source: Ambulatory Visit | Attending: Nephrology | Admitting: Nephrology

## 2021-02-07 ENCOUNTER — Other Ambulatory Visit: Payer: Self-pay

## 2021-02-07 VITALS — BP 124/49 | HR 69 | Temp 96.9°F | Resp 20

## 2021-02-07 DIAGNOSIS — N183 Chronic kidney disease, stage 3 unspecified: Secondary | ICD-10-CM | POA: Insufficient documentation

## 2021-02-07 DIAGNOSIS — D631 Anemia in chronic kidney disease: Secondary | ICD-10-CM | POA: Diagnosis not present

## 2021-02-07 LAB — IRON AND TIBC
Iron: 44 ug/dL (ref 28–170)
Saturation Ratios: 11 % (ref 10.4–31.8)
TIBC: 403 ug/dL (ref 250–450)
UIBC: 359 ug/dL

## 2021-02-07 LAB — POCT HEMOGLOBIN-HEMACUE: Hemoglobin: 10.9 g/dL — ABNORMAL LOW (ref 12.0–15.0)

## 2021-02-07 LAB — FERRITIN: Ferritin: 26 ng/mL (ref 11–307)

## 2021-02-07 MED ORDER — DARBEPOETIN ALFA 100 MCG/0.5ML IJ SOSY
100.0000 ug | PREFILLED_SYRINGE | INTRAMUSCULAR | Status: DC
Start: 2021-02-07 — End: 2021-02-08

## 2021-02-07 MED ORDER — DARBEPOETIN ALFA 100 MCG/0.5ML IJ SOSY
PREFILLED_SYRINGE | INTRAMUSCULAR | Status: AC
  Administered 2021-02-07: 100 ug via SUBCUTANEOUS
  Filled 2021-02-07: qty 0.5

## 2021-02-15 NOTE — Progress Notes (Signed)
Remote pacemaker transmission.   

## 2021-02-22 DIAGNOSIS — U071 COVID-19: Secondary | ICD-10-CM | POA: Diagnosis not present

## 2021-02-27 ENCOUNTER — Inpatient Hospital Stay (HOSPITAL_COMMUNITY): Payer: Medicare HMO

## 2021-02-27 ENCOUNTER — Inpatient Hospital Stay (HOSPITAL_COMMUNITY)
Admission: EM | Admit: 2021-02-27 | Discharge: 2021-03-14 | DRG: 177 | Disposition: A | Discharge status: Skilled Nursing Facility | Payer: Medicare HMO | Attending: Internal Medicine | Admitting: Internal Medicine

## 2021-02-27 ENCOUNTER — Other Ambulatory Visit: Payer: Self-pay

## 2021-02-27 ENCOUNTER — Emergency Department (HOSPITAL_COMMUNITY): Payer: Medicare HMO

## 2021-02-27 DIAGNOSIS — Z743 Need for continuous supervision: Secondary | ICD-10-CM | POA: Diagnosis not present

## 2021-02-27 DIAGNOSIS — U071 COVID-19: Principal | ICD-10-CM | POA: Diagnosis present

## 2021-02-27 DIAGNOSIS — K2900 Acute gastritis without bleeding: Secondary | ICD-10-CM | POA: Diagnosis present

## 2021-02-27 DIAGNOSIS — K449 Diaphragmatic hernia without obstruction or gangrene: Secondary | ICD-10-CM | POA: Diagnosis not present

## 2021-02-27 DIAGNOSIS — Z66 Do not resuscitate: Secondary | ICD-10-CM | POA: Diagnosis not present

## 2021-02-27 DIAGNOSIS — I4510 Unspecified right bundle-branch block: Secondary | ICD-10-CM | POA: Diagnosis present

## 2021-02-27 DIAGNOSIS — Z823 Family history of stroke: Secondary | ICD-10-CM

## 2021-02-27 DIAGNOSIS — Z833 Family history of diabetes mellitus: Secondary | ICD-10-CM

## 2021-02-27 DIAGNOSIS — I714 Abdominal aortic aneurysm, without rupture: Secondary | ICD-10-CM | POA: Diagnosis not present

## 2021-02-27 DIAGNOSIS — M810 Age-related osteoporosis without current pathological fracture: Secondary | ICD-10-CM | POA: Diagnosis not present

## 2021-02-27 DIAGNOSIS — J449 Chronic obstructive pulmonary disease, unspecified: Secondary | ICD-10-CM | POA: Diagnosis present

## 2021-02-27 DIAGNOSIS — Z7901 Long term (current) use of anticoagulants: Secondary | ICD-10-CM

## 2021-02-27 DIAGNOSIS — N1832 Chronic kidney disease, stage 3b: Secondary | ICD-10-CM | POA: Diagnosis not present

## 2021-02-27 DIAGNOSIS — Z809 Family history of malignant neoplasm, unspecified: Secondary | ICD-10-CM

## 2021-02-27 DIAGNOSIS — E1122 Type 2 diabetes mellitus with diabetic chronic kidney disease: Secondary | ICD-10-CM | POA: Diagnosis present

## 2021-02-27 DIAGNOSIS — D62 Acute posthemorrhagic anemia: Secondary | ICD-10-CM | POA: Diagnosis present

## 2021-02-27 DIAGNOSIS — K8020 Calculus of gallbladder without cholecystitis without obstruction: Secondary | ICD-10-CM | POA: Diagnosis not present

## 2021-02-27 DIAGNOSIS — Z87891 Personal history of nicotine dependence: Secondary | ICD-10-CM

## 2021-02-27 DIAGNOSIS — I5040 Unspecified combined systolic (congestive) and diastolic (congestive) heart failure: Secondary | ICD-10-CM | POA: Diagnosis not present

## 2021-02-27 DIAGNOSIS — K3189 Other diseases of stomach and duodenum: Secondary | ICD-10-CM | POA: Diagnosis not present

## 2021-02-27 DIAGNOSIS — E785 Hyperlipidemia, unspecified: Secondary | ICD-10-CM | POA: Diagnosis present

## 2021-02-27 DIAGNOSIS — K2211 Ulcer of esophagus with bleeding: Secondary | ICD-10-CM | POA: Diagnosis present

## 2021-02-27 DIAGNOSIS — R0602 Shortness of breath: Secondary | ICD-10-CM | POA: Diagnosis not present

## 2021-02-27 DIAGNOSIS — I5042 Chronic combined systolic (congestive) and diastolic (congestive) heart failure: Secondary | ICD-10-CM | POA: Diagnosis not present

## 2021-02-27 DIAGNOSIS — K222 Esophageal obstruction: Secondary | ICD-10-CM | POA: Diagnosis not present

## 2021-02-27 DIAGNOSIS — Z8249 Family history of ischemic heart disease and other diseases of the circulatory system: Secondary | ICD-10-CM | POA: Diagnosis not present

## 2021-02-27 DIAGNOSIS — I428 Other cardiomyopathies: Secondary | ICD-10-CM | POA: Diagnosis not present

## 2021-02-27 DIAGNOSIS — Z28310 Unvaccinated for covid-19: Secondary | ICD-10-CM | POA: Diagnosis not present

## 2021-02-27 DIAGNOSIS — I130 Hypertensive heart and chronic kidney disease with heart failure and stage 1 through stage 4 chronic kidney disease, or unspecified chronic kidney disease: Secondary | ICD-10-CM | POA: Diagnosis present

## 2021-02-27 DIAGNOSIS — K2210 Ulcer of esophagus without bleeding: Secondary | ICD-10-CM | POA: Diagnosis not present

## 2021-02-27 DIAGNOSIS — Z950 Presence of cardiac pacemaker: Secondary | ICD-10-CM

## 2021-02-27 DIAGNOSIS — E876 Hypokalemia: Secondary | ICD-10-CM | POA: Diagnosis not present

## 2021-02-27 DIAGNOSIS — K2100 Gastro-esophageal reflux disease with esophagitis, without bleeding: Secondary | ICD-10-CM | POA: Diagnosis present

## 2021-02-27 DIAGNOSIS — I5022 Chronic systolic (congestive) heart failure: Secondary | ICD-10-CM | POA: Diagnosis not present

## 2021-02-27 DIAGNOSIS — Z79899 Other long term (current) drug therapy: Secondary | ICD-10-CM

## 2021-02-27 DIAGNOSIS — I10 Essential (primary) hypertension: Secondary | ICD-10-CM | POA: Diagnosis not present

## 2021-02-27 DIAGNOSIS — E782 Mixed hyperlipidemia: Secondary | ICD-10-CM | POA: Diagnosis not present

## 2021-02-27 DIAGNOSIS — K921 Melena: Secondary | ICD-10-CM | POA: Diagnosis not present

## 2021-02-27 DIAGNOSIS — I480 Paroxysmal atrial fibrillation: Secondary | ICD-10-CM | POA: Diagnosis present

## 2021-02-27 DIAGNOSIS — K209 Esophagitis, unspecified without bleeding: Secondary | ICD-10-CM | POA: Diagnosis not present

## 2021-02-27 DIAGNOSIS — K922 Gastrointestinal hemorrhage, unspecified: Secondary | ICD-10-CM | POA: Diagnosis present

## 2021-02-27 DIAGNOSIS — Z8719 Personal history of other diseases of the digestive system: Secondary | ICD-10-CM | POA: Diagnosis not present

## 2021-02-27 DIAGNOSIS — E119 Type 2 diabetes mellitus without complications: Secondary | ICD-10-CM | POA: Diagnosis not present

## 2021-02-27 DIAGNOSIS — K2991 Gastroduodenitis, unspecified, with bleeding: Secondary | ICD-10-CM | POA: Diagnosis not present

## 2021-02-27 DIAGNOSIS — K5790 Diverticulosis of intestine, part unspecified, without perforation or abscess without bleeding: Secondary | ICD-10-CM | POA: Diagnosis not present

## 2021-02-27 DIAGNOSIS — Z20828 Contact with and (suspected) exposure to other viral communicable diseases: Secondary | ICD-10-CM | POA: Diagnosis not present

## 2021-02-27 DIAGNOSIS — K2289 Other specified disease of esophagus: Secondary | ICD-10-CM | POA: Diagnosis not present

## 2021-02-27 DIAGNOSIS — R5381 Other malaise: Secondary | ICD-10-CM | POA: Diagnosis not present

## 2021-02-27 DIAGNOSIS — D500 Iron deficiency anemia secondary to blood loss (chronic): Secondary | ICD-10-CM | POA: Diagnosis not present

## 2021-02-27 DIAGNOSIS — N184 Chronic kidney disease, stage 4 (severe): Secondary | ICD-10-CM | POA: Diagnosis not present

## 2021-02-27 LAB — CBC WITH DIFFERENTIAL/PLATELET
Abs Immature Granulocytes: 0.04 10*3/uL (ref 0.00–0.07)
Basophils Absolute: 0 10*3/uL (ref 0.0–0.1)
Basophils Relative: 0 %
Eosinophils Absolute: 0 10*3/uL (ref 0.0–0.5)
Eosinophils Relative: 0 %
HCT: 29.4 % — ABNORMAL LOW (ref 36.0–46.0)
Hemoglobin: 9.3 g/dL — ABNORMAL LOW (ref 12.0–15.0)
Immature Granulocytes: 1 %
Lymphocytes Relative: 19 %
Lymphs Abs: 1.2 10*3/uL (ref 0.7–4.0)
MCH: 28.7 pg (ref 26.0–34.0)
MCHC: 31.6 g/dL (ref 30.0–36.0)
MCV: 90.7 fL (ref 80.0–100.0)
Monocytes Absolute: 0.4 10*3/uL (ref 0.1–1.0)
Monocytes Relative: 7 %
Neutro Abs: 4.5 10*3/uL (ref 1.7–7.7)
Neutrophils Relative %: 73 %
Platelets: 169 10*3/uL (ref 150–400)
RBC: 3.24 MIL/uL — ABNORMAL LOW (ref 3.87–5.11)
RDW: 16.4 % — ABNORMAL HIGH (ref 11.5–15.5)
WBC: 6.1 10*3/uL (ref 4.0–10.5)
nRBC: 0 % (ref 0.0–0.2)

## 2021-02-27 LAB — COMPREHENSIVE METABOLIC PANEL
ALT: 24 U/L (ref 0–44)
AST: 24 U/L (ref 15–41)
Albumin: 3.1 g/dL — ABNORMAL LOW (ref 3.5–5.0)
Alkaline Phosphatase: 68 U/L (ref 38–126)
Anion gap: 8 (ref 5–15)
BUN: 37 mg/dL — ABNORMAL HIGH (ref 8–23)
CO2: 25 mmol/L (ref 22–32)
Calcium: 9.2 mg/dL (ref 8.9–10.3)
Chloride: 101 mmol/L (ref 98–111)
Creatinine, Ser: 1.96 mg/dL — ABNORMAL HIGH (ref 0.44–1.00)
GFR, Estimated: 25 mL/min — ABNORMAL LOW (ref >60)
Glucose, Bld: 170 mg/dL — ABNORMAL HIGH (ref 70–99)
Potassium: 3.9 mmol/L (ref 3.5–5.1)
Sodium: 134 mmol/L — ABNORMAL LOW (ref 135–145)
Total Bilirubin: 0.4 mg/dL (ref 0.3–1.2)
Total Protein: 6.3 g/dL — ABNORMAL LOW (ref 6.5–8.1)

## 2021-02-27 LAB — PROTIME-INR
INR: 1.4 — ABNORMAL HIGH (ref 0.8–1.2)
Prothrombin Time: 17 seconds — ABNORMAL HIGH (ref 11.4–15.2)

## 2021-02-27 LAB — TYPE AND SCREEN
ABO/RH(D): A POS
Antibody Screen: NEGATIVE

## 2021-02-27 LAB — D-DIMER, QUANTITATIVE: D-Dimer, Quant: 0.75 ug/mL-FEU — ABNORMAL HIGH (ref 0.00–0.50)

## 2021-02-27 LAB — APTT: aPTT: 35 seconds (ref 24–36)

## 2021-02-27 MED ORDER — BENZONATATE 100 MG PO CAPS
100.0000 mg | ORAL_CAPSULE | Freq: Three times a day (TID) | ORAL | Status: DC
  Administered 2021-02-27 – 2021-03-08 (×27): 100 mg via ORAL
  Filled 2021-02-27 (×26): qty 1

## 2021-02-27 MED ORDER — ACETAMINOPHEN 500 MG PO TABS
500.0000 mg | ORAL_TABLET | Freq: Four times a day (QID) | ORAL | Status: DC | PRN
  Administered 2021-03-07 – 2021-03-13 (×2): 500 mg via ORAL
  Filled 2021-02-27 (×2): qty 1

## 2021-02-27 MED ORDER — TORSEMIDE 20 MG PO TABS
20.0000 mg | ORAL_TABLET | Freq: Every day | ORAL | Status: DC
  Administered 2021-03-01 – 2021-03-14 (×14): 20 mg via ORAL
  Filled 2021-02-27 (×15): qty 1

## 2021-02-27 MED ORDER — POLYETHYLENE GLYCOL 3350 17 G PO PACK
17.0000 g | PACK | Freq: Every day | ORAL | Status: DC | PRN
  Administered 2021-03-08 – 2021-03-12 (×4): 17 g via ORAL
  Filled 2021-02-27 (×5): qty 1

## 2021-02-27 MED ORDER — ONDANSETRON HCL 4 MG/2ML IJ SOLN
4.0000 mg | Freq: Once | INTRAMUSCULAR | Status: AC
  Administered 2021-02-27: 4 mg via INTRAVENOUS
  Filled 2021-02-27: qty 2

## 2021-02-27 MED ORDER — IOHEXOL 9 MG/ML PO SOLN
ORAL | Status: AC
  Filled 2021-02-27: qty 1000

## 2021-02-27 MED ORDER — ENSURE ENLIVE PO LIQD
237.0000 mL | Freq: Two times a day (BID) | ORAL | Status: DC
  Administered 2021-03-01 – 2021-03-14 (×22): 237 mL via ORAL

## 2021-02-27 MED ORDER — PANTOPRAZOLE SODIUM 40 MG PO TBEC: 40.0000 mg | DELAYED_RELEASE_TABLET | Freq: Two times a day (BID) | ORAL | Status: DC

## 2021-02-27 MED ORDER — ONDANSETRON HCL 4 MG PO TABS
4.0000 mg | ORAL_TABLET | Freq: Four times a day (QID) | ORAL | Status: DC | PRN
  Administered 2021-03-02 – 2021-03-11 (×4): 4 mg via ORAL
  Filled 2021-02-27 (×4): qty 1

## 2021-02-27 MED ORDER — AMIODARONE HCL 200 MG PO TABS: 100.0000 mg | ORAL_TABLET | ORAL | Status: DC

## 2021-02-27 MED ORDER — ATORVASTATIN CALCIUM 10 MG PO TABS
20.0000 mg | ORAL_TABLET | Freq: Every day | ORAL | Status: DC
  Administered 2021-02-27 – 2021-03-13 (×15): 20 mg via ORAL
  Filled 2021-02-27 (×16): qty 2

## 2021-02-27 MED ORDER — CARVEDILOL 12.5 MG PO TABS
12.5000 mg | ORAL_TABLET | Freq: Two times a day (BID) | ORAL | Status: DC
  Administered 2021-02-28 – 2021-03-14 (×28): 12.5 mg via ORAL
  Filled 2021-02-27 (×8): qty 1
  Filled 2021-02-27: qty 4
  Filled 2021-02-27 (×19): qty 1

## 2021-02-27 MED ORDER — IPRATROPIUM-ALBUTEROL 20-100 MCG/ACT IN AERS
1.0000 | INHALATION_SPRAY | Freq: Four times a day (QID) | RESPIRATORY_TRACT | Status: DC
  Administered 2021-02-28: 1 via RESPIRATORY_TRACT
  Filled 2021-02-27: qty 4

## 2021-02-27 MED ORDER — HYDRALAZINE HCL 50 MG PO TABS
50.0000 mg | ORAL_TABLET | Freq: Two times a day (BID) | ORAL | Status: DC
  Administered 2021-02-27 – 2021-03-14 (×28): 50 mg via ORAL
  Filled 2021-02-27 (×8): qty 1
  Filled 2021-02-27: qty 2
  Filled 2021-02-27 (×4): qty 1
  Filled 2021-02-27: qty 2
  Filled 2021-02-27 (×16): qty 1

## 2021-02-27 MED ORDER — DIPHENHYDRAMINE HCL 25 MG PO CAPS
25.0000 mg | ORAL_CAPSULE | Freq: Four times a day (QID) | ORAL | Status: DC | PRN
  Administered 2021-03-09: 25 mg via ORAL
  Filled 2021-02-27: qty 1

## 2021-02-27 MED ORDER — PANTOPRAZOLE SODIUM 40 MG IV SOLR
40.0000 mg | Freq: Once | INTRAVENOUS | Status: AC
  Administered 2021-02-27: 40 mg via INTRAVENOUS
  Filled 2021-02-27: qty 40

## 2021-02-27 MED ORDER — GUAIFENESIN ER 600 MG PO TB12
1200.0000 mg | ORAL_TABLET | Freq: Two times a day (BID) | ORAL | Status: DC
  Administered 2021-02-27 – 2021-03-05 (×12): 1200 mg via ORAL
  Filled 2021-02-27 (×12): qty 2

## 2021-02-27 NOTE — H&P (Signed)
History and Physical    Krista Mason DOB: [birth year] DOA: 02/27/2021  PCP: Leighton Ruff, MD (Confirm with patient/family/NH records and if not entered, this has to be entered at [facility] point of entry) Patient coming from: Home  I have personally briefly reviewed patient's old medical records in [place]  Chief Complaint: Cough, abd pain and melena  HPI: Krista Mason is a 85 y.o. female with medical history significant of chronic systolic CHF, A. fib on Eliquis, GERD, CKD stage III, HTN, HLD, COPD Gold stage I (2018), presented with multiple complaints.  Patient started to have significant postnasal drip and congestion since early last week, he developed a dry cough with low-grade fever.  She used self tested for COVID 5 days ago which turned positive.  She went to see her PCP at [facility], who referred her for infusion center.  At the same time, for a week or so, she developed cramping-like abdominal pain, usually after she eats or drink.  And she noticed her stool color has turned darker.  Denies any nauseous vomiting.  T-max in last 30 days was 99 compared to baseline 87.4.  No leg swelling, her cardiologist recently switched her from Lasix to torsemide.  Patient's not vaccinated for COVID-19 ED Course: COVID test pending.  No significant hypoxia, blood pressure stable, chest x-ray no acute infiltrates.  Hb 9.3 compared to 10.9, 2 weeks ago.  Review of Systems: As per HPI otherwise 14 point review of systems negative.    Past Medical History:  Diagnosis Date  . Anemia   . Atrial fibrillation ([place])   . Chronic combined systolic and diastolic CHF (congestive heart failure) ([place])    a. LV dysfcuntion felt to be related to PVCs  . Chronic kidney disease   . Diabetes mellitus without complication ([place])   . GERD (gastroesophageal reflux disease)   . Hx of echocardiogram    Echo (1/16):  EF 60-65%, no RWMA, Gr 1 DD, mod AI, mod MR, mild LAE  . Hyperkalemia   .  Hyperlipidemia   . Hypertension   . NICM (nonischemic cardiomyopathy) ([place])    a. 12/2018 Echo: EF 35%, diff HK w/ septal-lateral dyssynchrony. Nl RV fxn; b. 07/2019 s/p MDT [id number] [name] CRT-P MRI SureScan device (ser #: [id number])  . Orthostatic hypotension   . PVC (premature ventricular contraction)    a. s/p PVC ablation in 11/2016  . Rheumatic fever   . Wears dentures     Past Surgical History:  Procedure Laterality Date  . BALLOON DILATION N/A 07/12/2020   Procedure: BALLOON DILATION;  Surgeon: Ronald Lobo, MD;  Location: [facility];  Service: Endoscopy;  Laterality: N/A;  . BIOPSY  08/20/2019   Procedure: BIOPSY;  Surgeon: Otis Brace, MD;  Location: [place] ENDOSCOPY;  Service: Gastroenterology;;  . BIV PACEMAKER INSERTION CRT-P N/A 07/24/2019   Procedure: BIV PACEMAKER INSERTION CRT-P;  Surgeon: Constance Haw, MD;  Location: [place] CV LAB;  Service: Cardiovascular;  Laterality: N/A;  . COLONOSCOPY WITH PROPOFOL N/A 08/22/2019   Procedure: COLONOSCOPY WITH PROPOFOL;  Surgeon: Laurence Spates, MD;  Location: [place];  Service: Endoscopy;  Laterality: N/A;  . ESOPHAGOGASTRODUODENOSCOPY (EGD) WITH PROPOFOL N/A 09/25/2018   Procedure: ESOPHAGOGASTRODUODENOSCOPY (EGD) WITH PROPOFOL;  Surgeon: Ronald Lobo, MD;  Location: [facility];  Service: Endoscopy;  Laterality: N/A;  . ESOPHAGOGASTRODUODENOSCOPY (EGD) WITH PROPOFOL N/A 10/29/2018   Procedure: ESOPHAGOGASTRODUODENOSCOPY (EGD) WITH PROPOFOL;  Surgeon: Ronald Lobo, MD;  Location: [facility];  Service: Endoscopy;  Laterality: N/A;  . ESOPHAGOGASTRODUODENOSCOPY (EGD) WITH PROPOFOL N/A 08/20/2019   Procedure: ESOPHAGOGASTRODUODENOSCOPY (EGD) WITH PROPOFOL;  Surgeon: Otis Brace, MD;  Location: [facility];  Service: Gastroenterology;  Laterality: N/A;  . ESOPHAGOGASTRODUODENOSCOPY (EGD) WITH PROPOFOL N/A 05/03/2020   Procedure: ESOPHAGOGASTRODUODENOSCOPY (EGD) WITH PROPOFOL;  Surgeon: Ronald Lobo, MD;  Location: [facility];  Service: Endoscopy;  Laterality: N/A;  . ESOPHAGOGASTRODUODENOSCOPY (EGD) WITH PROPOFOL N/A 06/02/2020   Procedure: ESOPHAGOGASTRODUODENOSCOPY (EGD) WITH PROPOFOL and Savory Dilatation;  Surgeon: Clarene Essex, MD;  [facility];  Service: Gastroenterology;  Laterality: N/A;  . ESOPHAGOGASTRODUODENOSCOPY (EGD) WITH PROPOFOL N/A 07/12/2020   Procedure: ESOPHAGOGASTRODUODENOSCOPY (EGD) WITH PROPOFOL with dilatation;  Surgeon: Ronald Lobo, MD;  Location: [facility];  Service: Endoscopy;  Laterality: N/A;  . HEMOSTASIS CLIP PLACEMENT  08/22/2019   Procedure: HEMOSTASIS CLIP PLACEMENT;  Surgeon: Laurence Spates, MD;  Location: [facility];  Service: Endoscopy;;  . KNEE SURGERY Left 2009  . OVARIAN CYST REMOVAL    . POLYPECTOMY  08/22/2019   Procedure: POLYPECTOMY;  Surgeon: Laurence Spates, MD;  Location: [place];  Service: Endoscopy;;  . PVC ABLATION N/A 11/30/2016   Procedure: PVC Ablation;  Surgeon: Will [name], MD;  Location: [place] CV LAB;  Service: Cardiovascular;  Laterality: N/A;  . RIGHT HEART CATH N/A 01/31/2018   Procedure: RIGHT HEART CATH;  Surgeon: Jolaine Artist, MD;  Location: [place] CV LAB;  Service: Cardiovascular;  Laterality: N/A;  . RIGHT/LEFT HEART CATH AND CORONARY ANGIOGRAPHY N/A 01/25/2017   Procedure: Right/Left Heart Cath and Coronary Angiography;  Surgeon: Jolaine Artist, MD;  Location: [place] CV LAB;  Service: Cardiovascular;  Laterality: N/A;  . SAVORY DILATION N/A 09/25/2018   Procedure: SAVORY DILATION;  Surgeon: Ronald Lobo, MD;  [facility];  Service: Endoscopy;  Laterality: N/A;  . SAVORY DILATION N/A 10/29/2018   Procedure: SAVORY DILATION;  Surgeon: Ronald Lobo, MD;  [facility];  Service: Endoscopy;  Laterality: N/A;  . SAVORY DILATION N/A 08/20/2019   Procedure: SAVORY DILATION;  Surgeon: Otis Brace, MD;  Location: [facility];  Service:  Gastroenterology;  Laterality: N/A;  . SAVORY DILATION N/A 05/03/2020   Procedure: SAVORY DILATION;  Surgeon: Ronald Lobo, MD;  [facility];  Service: Endoscopy;  Laterality: N/A;  Patient needs ultraslim pediatric upper endoscope/     no fluoro per dr. Rayne Du  . SAVORY DILATION N/A 06/02/2020   Procedure: SAVORY DILATION w/ Burtis Junes;  Surgeon: Clarene Essex, MD;  [facility]: [facility];  Service: Gastroenterology;  Laterality: N/A;     reports that she quit smoking about 4 years ago. She has a 45.00 pack-year smoking history. She has never used smokeless tobacco. She reports that she does not drink alcohol and does not use drugs.  Allergies  Allergen Reactions  . Epoetin Alfa Other (See Comments)    Other reaction(s): Other (See Comments) Chills, low grade fever, body aches, fatigue, nausea for 3 days. Chills, low grade fever, body aches, fatigue, nausea for 3 days.   . Penicillins Itching and Rash    Amoxicillin ok- IM pen is what gives reaction  Did it involve swelling of the face/tongue/throat, SOB, or low BP? No Did it involve sudden or severe rash/hives, skin peeling, or any reaction on the inside of your mouth or nose? No Did you need to seek medical attention at a hospital or doctor's office? No When did it last happen?10 + year If all above answers are "NO", may proceed with cephalosporin use.   [name]  Retacrit [Epoetin (Alfa)] Other (See Comments)    Chills, low grade fever, body aches, fatigue, nausea for 3 days.  [name] [Cefaclor] Itching  . Crestor [Rosuvastatin Calcium] Nausea Only  . Influenza Vaccines     Swelling and redness at injection site, fever  . Nsaids     Pt states she is not taking because of kidney function  . Percocet [Oxycodone-Acetaminophen] Nausea And Vomiting  . Prednisone     Dizziness, spiked blood sugar    Family History  Problem Relation Age of Onset  . Heart attack Father   . Heart disease Father   . Cancer Father   .  Hypertension Father   . Hypertension Brother   . Stroke Brother   . Hypertension Brother   . Diabetes Son   . Hyperlipidemia Son   . Hypertension Son      Prior to Admission medications   Medication Sig Start Date End Date Taking? Authorizing Provider  acetaminophen (TYLENOL) 500 MG tablet Take 500 mg by mouth every 6 (six) hours as needed for moderate pain or headache.    [provider]  amiodarone (PACERONE) 200 MG tablet Take 0.5 tablets (100 mg total) by mouth every other day. 12/12/20   Camnitz, Will Hassell Done, MD  atorvastatin (LIPITOR) 20 MG tablet TAKE 1 TABLET BY MOUTH EVERY DAY 12/04/19   Bensimhon, Shaune Pascal, MD  carvedilol (COREG) 12.5 MG tablet Take 1 tablet (12.5 mg total) by mouth 2 (two) times daily with a meal. 05/04/20   Bensimhon, Shaune Pascal, MD  Cholecalciferol (VITAMIN D3) 50 MCG (2000 UT) TABS Take 2,000 Units by mouth daily.    [provider]  diphenhydrAMINE (BENADRYL) 25 mg capsule Take 25 mg by mouth every 6 (six) hours as needed for allergies.     [provider]  ELIQUIS 2.5 MG TABS tablet TAKE 1 TABLET BY MOUTH TWICE A DAY 06/14/20   Fenton, Clint R, PA  feeding supplement, ENSURE ENLIVE, (ENSURE ENLIVE) LIQD Take 237 mLs by mouth 2 (two) times daily between meals. 08/25/19   Mikhail, Velta Addison, DO  hydrALAZINE (APRESOLINE) 50 MG tablet Take 50 mg by mouth 2 (two) times daily.    [provider]  imiquimod (ALDARA) 5 % cream Apply topically. 07/06/20   [provider]  ondansetron (ZOFRAN) 4 MG tablet Take 1 tablet (4 mg total) by mouth every 6 (six) hours as needed for nausea or vomiting. 02/26/83   Delora Fuel, MD  pantoprazole (PROTONIX) 40 MG tablet Take 1 tablet (40 mg total) by mouth 2 (two) times daily before a meal. 08/25/19   Mikhail, Velta Addison, DO  Polyethyl Glycol-Propyl Glycol (SYSTANE OP) Place 1 drop into both eyes 3 (three) times daily as needed (dryness).     [provider]  polyethylene glycol (MIRALAX /  GLYCOLAX) 17 g packet Take 17 g by mouth daily as needed (constipation.).    [provider]  torsemide (DEMADEX) 20 MG tablet Take 20 mg by mouth daily. 11/17/20   [provider]  vitamin B-12 (CYANOCOBALAMIN) 1000 MCG tablet Take 1 tablet (1,000 mcg total) by mouth daily. 08/25/19   Cristal Ford, DO    Physical Exam: Vitals:   02/27/21 1430 02/27/21 1545 02/27/21 1730 02/27/21 1800  BP: (!) 123/51 (!) 135/46 (!) 162/46 (!) 142/39  Pulse: 70 70 70 69  Resp: 17 20 20 17   Temp:      TempSrc:      SpO2: 95% 95% 96% 95%  Weight:  Height:        Constitutional: NAD, calm, comfortable Vitals:   02/27/21 1430 02/27/21 1545 02/27/21 1730 02/27/21 1800  BP: (!) 123/51 (!) 135/46 (!) 162/46 (!) 142/39  Pulse: 70 70 70 69  Resp: 17 20 20 17   Temp:      TempSrc:      SpO2: 95% 95% 96% 95%  Weight:      Height:       Eyes: PERRL, lids and conjunctivae normal ENMT: Mucous membranes are moist. Posterior pharynx clear of any exudate or lesions.Normal dentition.  Neck: normal, supple, no masses, no thyromegaly Respiratory: clear to auscultation bilaterally, no wheezing, no crackles. Normal respiratory effort. No accessory muscle use.  Cardiovascular: Regular rate and rhythm, no murmurs / rubs / gallops. No extremity edema. 2+ pedal pulses. No carotid bruits.  Abdomen: no tenderness, no masses palpated. No hepatosplenomegaly. Bowel sounds positive.  Musculoskeletal: no clubbing / cyanosis. No joint deformity upper and lower extremities. Good ROM, no contractures. Normal muscle tone.  Skin: no rashes, lesions, ulcers. No induration Neurologic: CN 2-12 grossly intact. Sensation intact, DTR normal. Strength 5/5 in all 4.  Psychiatric: Normal judgment and insight. Alert and oriented x 3. Normal mood.     Labs on Admission: I have personally reviewed following labs and imaging studies  CBC: Recent Labs  Lab 02/27/21 1353  WBC 6.1  NEUTROABS 4.5  HGB 9.3*  HCT  29.4*  MCV 90.7  PLT 016   Basic Metabolic Panel: Recent Labs  Lab 02/27/21 1353  NA 134*  K 3.9  CL 101  CO2 25  GLUCOSE 170*  BUN 37*  CREATININE 1.96*  CALCIUM 9.2   GFR: Estimated Creatinine Clearance: 19.9 mL/min (A) (by C-G formula based on SCr of 1.96 mg/dL (H)). Liver Function Tests: Recent Labs  Lab 02/27/21 1353  AST 24  ALT 24  ALKPHOS 68  BILITOT 0.4  PROT 6.3*  ALBUMIN 3.1*   No results for input(s): LIPASE, AMYLASE in the last 168 hours. No results for input(s): AMMONIA in the last 168 hours. Coagulation Profile: No results for input(s): INR, PROTIME in the last 168 hours. Cardiac Enzymes: No results for input(s): CKTOTAL, CKMB, CKMBINDEX, TROPONINI in the last 168 hours. BNP (last 3 results) No results for input(s): PROBNP in the last 8760 hours. HbA1C: No results for input(s): HGBA1C in the last 72 hours. CBG: No results for input(s): GLUCAP in the last 168 hours. Lipid Profile: No results for input(s): CHOL, HDL, LDLCALC, TRIG, CHOLHDL, LDLDIRECT in the last 72 hours. Thyroid Function Tests: No results for input(s): TSH, T4TOTAL, FREET4, T3FREE, THYROIDAB in the last 72 hours. Anemia Panel: No results for input(s): VITAMINB12, FOLATE, FERRITIN, TIBC, IRON, RETICCTPCT in the last 72 hours. Urine analysis:    Component Value Date/Time   COLORURINE STRAW (A) 01/28/2018 1353   APPEARANCEUR CLEAR 01/28/2018 1353   LABSPEC 1.006 01/28/2018 1353   PHURINE 6.0 01/28/2018 1353   GLUCOSEU NEGATIVE 01/28/2018 1353   HGBUR NEGATIVE 01/28/2018 1353   BILIRUBINUR NEGATIVE 01/28/2018 1353   KETONESUR NEGATIVE 01/28/2018 1353   PROTEINUR NEGATIVE 01/28/2018 1353   UROBILINOGEN 0.2 04/15/2012 1451   NITRITE NEGATIVE 01/28/2018 1353   LEUKOCYTESUR NEGATIVE 01/28/2018 1353    Radiological Exams on Admission: DG Chest Portable 1 View  Result Date: 02/27/2021 CLINICAL DATA:  Short of breath.  COVID-19 positive. EXAM: PORTABLE CHEST 1 VIEW COMPARISON:   08/18/2019. FINDINGS: Cardiac silhouette mildly enlarged. Stable left anterior chest wall biventricular cardioverter-defibrillator. Dense calcification  along the thoracic aorta and in the AP window, stable. No mediastinal or hilar masses. Lungs hyperexpanded, but clear. No pleural effusion or pneumothorax. Skeletal structures are grossly intact. IMPRESSION: 1. No acute cardiopulmonary disease. Electronically Signed   By: Lajean Manes M.D.   On: 02/27/2021 14:05    EKG: Independently reviewed.  Chronic RBBB  Assessment/Plan Active Problems:   GI bleed  (please populate well all problems here in Problem List. (For example, if patient is on BP meds at home and you resume or decide to hold them, it is a problem that needs to be her. Same for CAD, COPD, HLD and so on)  Acute on chronic anemia secondary to GI bleed -Vital signs stable so far, will repeat H&H tonight, if CIWA further significant drop or worsening of vital signs, will transfuse. -[facility] consulted. -PPI BID -NPO after midnight -Hold Eliquis.  COVID infection -No significant pneumonia on x-ray, no hypoxia and no fever. -Waiting for a confirmation COVID test pending EGD.  If positive, patient is qualified for prophylaxis infusions +/- remdesivir.  Chronic systolic CHF -Euvolemic, continue home dose of torsemide  COPD Gold stage I -Stable  CKD stage III -Euvolemic.  PAF -Continue amiodarone, hold Eliquis  HTN -BP and HR stable, continue short acting BP meds.  DVT prophylaxis: SCDCode Status: DNR Family Communication: None at bedside Disposition Plan: Expect more than 2 midnight hospital stay Consults called: GI Admission status: Tele admit   Lequita Halt MD [facility] Pager [phone]  02/27/2021, 6:45 PM

## 2021-02-27 NOTE — ED Notes (Signed)
Tried to call report. Will call back

## 2021-02-27 NOTE — ED Notes (Signed)
Pt ambulated in room on room air. O2 sats 96-100 the entire time. Increased shob while standing

## 2021-02-27 NOTE — ED Provider Notes (Signed)
[facility] EMERGENCY DEPARTMENT Provider Note   CSN: [id number] Arrival date & time: 02/27/21  1254     History Chief Complaint  Patient presents with  . Shortness of Breath    Krista Mason is a 85 y.o. female with PMH of HTN, HLD, DM, CKD, COPD, NICM with LVEF 35 to 40%, and paroxysmal atrial fibrillation with CHA2DS2-VASc 2 score of 6 on Eliquis and amiodarone who presents the ED via EMS with complaints of shortness of breath.  History was obtained by EMS reports that patient was diagnosed with COVID-19 01/28/2021 and has been experiencing progressively worsening shortness of breath and cough.  On my examination, patient reports that she lives at an independent living facility.  On 02/21/2021 she became symptomatic with cold symptoms and then tested positive for COVID-19 on 02/22/2021.  She states that she initially began to feel improved, but then yesterday developed worsening shortness of breath symptoms, particular with ambulation.  She also is endorsing generalized weakness, in part due to episodes of nausea and emesis.  She denies any hematemesis, but does state that she has been having dark black stools.  She has a history of esophagitis, but denies history of PUD or cirrhosis.  She has been taking her medications, as directed.  Most recent dose of Eliquis was this morning.  She states that her primary care provider with the [facility] had tried to set her up for infusion given high risk for poor outcomes in setting of COVID-19, but she never received a phone call back.  She also was on the phone with the pharmacist for over an hour this morning trying to get her prescribed medications, without any success.  She states that her profound weakness and difficulty with outpatient management is what led to her calling 911 today.  She denies any hematemesis, hemoptysis, numbness or extremity weakness, unilateral extremity swelling or edema, abdominal pain, room  spinning dizziness, chest pain, or other symptoms.  Patient was not immunized for COVID-19, reportedly because she had a bad reaction to an influenza vaccine few years ago.  HPI     Past Medical History:  Diagnosis Date  . Anemia   . Atrial fibrillation ([place])   . Chronic combined systolic and diastolic CHF (congestive heart failure) ([place])    a. LV dysfcuntion felt to be related to PVCs  . Chronic kidney disease   . Diabetes mellitus without complication ([place])   . GERD (gastroesophageal reflux disease)   . Hx of echocardiogram    Echo (1/16):  EF 60-65%, no RWMA, Gr 1 DD, mod AI, mod MR, mild LAE  . Hyperkalemia   . Hyperlipidemia   . Hypertension   . NICM (nonischemic cardiomyopathy) ([place])    a. 12/2018 Echo: EF 35%, diff HK w/ septal-lateral dyssynchrony. Nl RV fxn; b. 07/2019 s/p MDT [id number] [name] CRT-P MRI SureScan device (ser #: [id number])  . Orthostatic hypotension   . PVC (premature ventricular contraction)    a. s/p PVC ablation in 11/2016  . Rheumatic fever   . Wears dentures     Patient Active Problem List   Diagnosis Date Noted  . Paroxysmal atrial fibrillation ([place]) 09/10/2019  . Secondary hypercoagulable state ([place]) 09/10/2019  . Gastrointestinal hemorrhage with melena 08/18/2019  . UGI bleed 08/18/2019  . Nonischemic cardiomyopathy ([place]) 07/24/2019  . Acute hypoxemic respiratory failure ([place])   . COPD  GOLD 0  01/17/2017  . Chronic systolic CHF (congestive heart failure) ([place]) 11/13/2016  .  PVC (premature ventricular contraction) 11/13/2016  . Mixed hyperlipidemia 11/05/2016  . CKD (chronic kidney disease), stage III ([place]) 11/05/2016  . Tobacco abuse 11/05/2016  . Multifocal PVCs 11/05/2016  . Moderate mitral regurgitation 11/05/2016  . Microcytic anemia 11/05/2016  . Diabetes mellitus with complication ([place])   . Uncontrolled hypertension 06/18/2016  . Multifocal atrial tachycardia ([place]) 11/26/2014  . PVD (peripheral vascular disease) ([place])  12/15/2012    Past Surgical History:  Procedure Laterality Date  . BALLOON DILATION N/A 07/12/2020   Procedure: BALLOON DILATION;  Surgeon: Ronald Lobo, MD;  Location: [facility];  Service: Endoscopy;  Laterality: N/A;  . BIOPSY  08/20/2019   Procedure: BIOPSY;  Surgeon: Otis Brace, MD;  Location: [facility];  Service: Gastroenterology;;  . BIV PACEMAKER INSERTION CRT-P N/A 07/24/2019   Procedure: BIV PACEMAKER INSERTION CRT-P;  Surgeon: Constance Haw, MD;  Location: [facility];  Service: Cardiovascular;  Laterality: N/A;  . COLONOSCOPY WITH PROPOFOL N/A 08/22/2019   Procedure: COLONOSCOPY WITH PROPOFOL;  Surgeon: Laurence Spates, MD;  Location: [facility];  Service: Endoscopy;  Laterality: N/A;  . ESOPHAGOGASTRODUODENOSCOPY (EGD) WITH PROPOFOL N/A 09/25/2018   Procedure: ESOPHAGOGASTRODUODENOSCOPY (EGD) WITH PROPOFOL;  Surgeon: Ronald Lobo, MD;  [facility];  Service: Endoscopy;  Laterality: N/A;  . ESOPHAGOGASTRODUODENOSCOPY (EGD) WITH PROPOFOL N/A 10/29/2018   Procedure: ESOPHAGOGASTRODUODENOSCOPY (EGD) WITH PROPOFOL;  Surgeon: Ronald Lobo, MD;  [facility];  Service: Endoscopy;  Laterality: N/A;  . ESOPHAGOGASTRODUODENOSCOPY (EGD) WITH PROPOFOL N/A 08/20/2019   Procedure: ESOPHAGOGASTRODUODENOSCOPY (EGD) WITH PROPOFOL;  Surgeon: Otis Brace, MD;  Location: [facility];  Service: Gastroenterology;  Laterality: N/A;  . ESOPHAGOGASTRODUODENOSCOPY (EGD) WITH PROPOFOL N/A 05/03/2020   Procedure: ESOPHAGOGASTRODUODENOSCOPY (EGD) WITH PROPOFOL;  Surgeon: Ronald Lobo, MD;  [facility];  Service: Endoscopy;  Laterality: N/A;  . ESOPHAGOGASTRODUODENOSCOPY (EGD) WITH PROPOFOL N/A 06/02/2020   Procedure: ESOPHAGOGASTRODUODENOSCOPY (EGD) WITH PROPOFOL and Savory Dilatation;  Surgeon: Clarene Essex, MD;  [facility];  Service: Gastroenterology;  Laterality: N/A;  . ESOPHAGOGASTRODUODENOSCOPY (EGD) WITH PROPOFOL N/A  07/12/2020   Procedure: ESOPHAGOGASTRODUODENOSCOPY (EGD) WITH PROPOFOL with dilatation;  Surgeon: Ronald Lobo, MD;  [facility];  Service: Endoscopy;  Laterality: N/A;  . HEMOSTASIS CLIP PLACEMENT  08/22/2019   Procedure: HEMOSTASIS CLIP PLACEMENT;  Surgeon: Laurence Spates, MD;  Location[facility];  Service: Endoscopy;;  . KNEE SURGERY Left 2009  . OVARIAN CYST REMOVAL    . POLYPECTOMY  08/22/2019   Procedure: POLYPECTOMY;  Surgeon: Laurence Spates, MD;  Location: [facility];  Service: Endoscopy;;  . PVC ABLATION N/A 11/30/2016   Procedure: PVC Ablation;  Surgeon: Will [name], MD;  Location: [facility];  Service: Cardiovascular;  Laterality: N/A;  . RIGHT HEART CATH N/A 01/31/2018   Procedure: RIGHT HEART CATH;  Surgeon: Jolaine Artist, MD;  Location: [facility];  Service: Cardiovascular;  Laterality: N/A;  . RIGHT/LEFT HEART CATH AND CORONARY ANGIOGRAPHY N/A 01/25/2017   Procedure: Right/Left Heart Cath and Coronary Angiography;  Surgeon: Jolaine Artist, MD;  Location: [facility];  Service: Cardiovascular;  Laterality: N/A;  . SAVORY DILATION N/A 09/25/2018   Procedure: SAVORY DILATION;  Surgeon: Ronald Lobo, MD;  [facility];  Service: Endoscopy;  Laterality: N/A;  . SAVORY DILATION N/A 10/29/2018   Procedure: SAVORY DILATION;  Surgeon: Ronald Lobo, MD;  [facility];  Service: Endoscopy;  Laterality: N/A;  . SAVORY DILATION N/A 08/20/2019   Procedure: SAVORY DILATION;  Surgeon: Otis Brace, MD;  Location:  [facility];  Service: Gastroenterology;  Laterality: N/A;  . SAVORY DILATION N/A 05/03/2020   Procedure: SAVORY DILATION;  Surgeon: Ronald Lobo, MD;  [facility];  Service: Endoscopy;  Laterality: N/A;  Patient needs ultraslim pediatric upper endoscope/     no fluoro per dr. Rayne Du  . SAVORY DILATION N/A 06/02/2020   Procedure: SAVORY DILATION w/ Burtis Junes;  Surgeon: Clarene Essex, MD;   [facility];  Service: Gastroenterology;  Laterality: N/A;     OB History   No obstetric history on file.     Family History  Problem Relation Age of Onset  . Heart attack Father   . Heart disease Father   . Cancer Father   . Hypertension Father   . Hypertension Brother   . Stroke Brother   . Hypertension Brother   . Diabetes Son   . Hyperlipidemia Son   . Hypertension Son     Social History   Tobacco Use  . Smoking status: Former Smoker    Packs/day: 0.75    Years: 60.00    Pack years: 45.00    Quit date: 11/04/2016    Years since quitting: 4.3  . Smokeless tobacco: Never Used  Vaping Use  . Vaping Use: Never used  Substance Use Topics  . Alcohol use: No  . Drug use: No    Home Medications Prior to Admission medications   Medication Sig Start Date End Date Taking? Authorizing Provider  acetaminophen (TYLENOL) 500 MG tablet Take 500 mg by mouth every 6 (six) hours as needed for moderate pain or headache.    [provider]  amiodarone (PACERONE) 200 MG tablet Take 0.5 tablets (100 mg total) by mouth every other day. 12/12/20   Camnitz, Will Hassell Done, MD  atorvastatin (LIPITOR) 20 MG tablet TAKE 1 TABLET BY MOUTH EVERY DAY 12/04/19   Bensimhon, Shaune Pascal, MD  carvedilol (COREG) 12.5 MG tablet Take 1 tablet (12.5 mg total) by mouth 2 (two) times daily with a meal. 05/04/20   Bensimhon, Shaune Pascal, MD  Cholecalciferol (VITAMIN D3) 50 MCG (2000 UT) TABS Take 2,000 Units by mouth daily.    [provider]  diphenhydrAMINE (BENADRYL) 25 mg capsule Take 25 mg by mouth every 6 (six) hours as needed for allergies.     [provider]  ELIQUIS 2.5 MG TABS tablet TAKE 1 TABLET BY MOUTH TWICE A DAY 06/14/20   Fenton, Clint R, PA  feeding supplement, ENSURE ENLIVE, (ENSURE ENLIVE) LIQD Take 237 mLs by mouth 2 (two) times daily between meals. 08/25/19   Mikhail, Velta Addison, DO  hydrALAZINE (APRESOLINE) 50 MG tablet Take 50 mg by mouth 2 (two) times daily.     [provider]  imiquimod (ALDARA) 5 % cream Apply topically. 07/06/20   [provider]  ondansetron (ZOFRAN) 4 MG tablet Take 1 tablet (4 mg total) by mouth every 6 (six) hours as needed for nausea or vomiting. 12/27/08   Delora Fuel, MD  pantoprazole (PROTONIX) 40 MG tablet Take 1 tablet (40 mg total) by mouth 2 (two) times daily before a meal. 08/25/19   Mikhail, Velta Addison, DO  Polyethyl Glycol-Propyl Glycol (SYSTANE OP) Place 1 drop into both eyes 3 (three) times daily as needed (dryness).     [provider]  polyethylene glycol (MIRALAX / GLYCOLAX) 17 g packet Take 17 g by mouth daily as needed (constipation.).    [provider]  torsemide (DEMADEX) 20 MG tablet Take 20 mg by mouth daily. 11/17/20  [provider]  vitamin B-12 (CYANOCOBALAMIN) 1000 MCG tablet Take 1 tablet (1,000 mcg total) by mouth daily. 08/25/19   Cristal Ford, DO    Allergies    Epoetin alfa, Penicillins, Retacrit [epoetin (alfa)], Ceclor [cefaclor], Crestor [rosuvastatin calcium], Influenza vaccines, Nsaids, Percocet [oxycodone-acetaminophen], and Prednisone  Review of Systems   Review of Systems  All other systems reviewed and are negative.   Physical Exam Updated Vital Signs BP (!) 162/46   Pulse 70   Temp 98.6 F (37 C) (Oral)   Resp 20   Ht 5\' 5"  (1.651 m)   Wt 68 kg   SpO2 96%   BMI 24.96 kg/m   Physical Exam Vitals and nursing note reviewed. Exam conducted with a chaperone present.  Constitutional:      General: She is not in acute distress.    Appearance: She is ill-appearing. She is not toxic-appearing.  HENT:     Head: Normocephalic and atraumatic.  Eyes:     General: No scleral icterus.    Conjunctiva/sclera: Conjunctivae normal.  Cardiovascular:     Rate and Rhythm: Normal rate.     Pulses: Normal pulses.  Pulmonary:     Effort: No respiratory distress.     Breath sounds: Rales present.     Comments: Moderate increased work of  breathing.  Difficulty speaking in full sentences without catching her breath.  98% on room air.  No accessory muscle use.  Rales appreciated in lower lobes bilaterally. Abdominal:     General: Abdomen is flat. There is no distension.     Palpations: Abdomen is soft.     Tenderness: There is no abdominal tenderness.  Musculoskeletal:     Right lower leg: No edema.     Left lower leg: No edema.  Skin:    General: Skin is dry.  Neurological:     Mental Status: She is alert and oriented to person, place, and time.     GCS: GCS eye subscore is 4. GCS verbal subscore is 5. GCS motor subscore is 6.  Psychiatric:        Mood and Affect: Mood normal.        Behavior: Behavior normal.        Thought Content: Thought content normal.     ED Results / Procedures / Treatments   Labs (all labs ordered are listed, but only abnormal results are displayed) Labs Reviewed  CBC WITH DIFFERENTIAL/PLATELET - Abnormal; Notable for the following components:      Result Value   RBC 3.24 (*)    Hemoglobin 9.3 (*)    HCT 29.4 (*)    RDW 16.4 (*)    All other components within normal limits  COMPREHENSIVE METABOLIC PANEL - Abnormal; Notable for the following components:   Sodium 134 (*)    Glucose, Bld 170 (*)    BUN 37 (*)    Creatinine, Ser 1.96 (*)    Total Protein 6.3 (*)    Albumin 3.1 (*)    GFR, Estimated 25 (*)    All other components within normal limits  D-DIMER, QUANTITATIVE - Abnormal; Notable for the following components:   D-Dimer, Quant 0.75 (*)    All other components within normal limits  URINALYSIS, ROUTINE W REFLEX MICROSCOPIC  OCCULT BLOOD X 1 CARD TO LAB, STOOL  PROTIME-INR  APTT  POC OCCULT BLOOD, ED  POC SARS CORONAVIRUS 2 AG -  ED  TYPE AND SCREEN    EKG None  Radiology DG Chest  Portable 1 View  Result Date: 02/27/2021 CLINICAL DATA:  Short of breath.  COVID-19 positive. EXAM: PORTABLE CHEST 1 VIEW COMPARISON:  08/18/2019. FINDINGS: Cardiac silhouette mildly  enlarged. Stable left anterior chest wall biventricular cardioverter-defibrillator. Dense calcification along the thoracic aorta and in the AP window, stable. No mediastinal or hilar masses. Lungs hyperexpanded, but clear. No pleural effusion or pneumothorax. Skeletal structures are grossly intact. IMPRESSION: 1. No acute cardiopulmonary disease. Electronically Signed   By: Lajean Manes M.D.   On: 02/27/2021 14:05    Procedures Procedures   Medications Ordered in ED Medications  pantoprazole (PROTONIX) injection 40 mg (has no administration in time range)  ondansetron (ZOFRAN) injection 4 mg (4 mg Intravenous Given 02/27/21 1349)    ED Course  I have reviewed the triage vital signs and the nursing notes.  Pertinent labs & imaging results that were available during my care of the patient were reviewed by me and considered in my medical decision making (see chart for details).  Clinical Course as of 02/27/21 1802  Mon Feb 27, 2021  1801 I spoke with Dr. Roosevelt Locks who will see and admit patient. [GG]    Clinical Course User Index [GG] Corena Herter, PA-C   MDM Rules/Calculators/A&P                          Quyen Cutsforth [place] was evaluated in Emergency Department on 02/27/2021 for the symptoms described in the history of present illness. She was evaluated in the context of the global COVID-19 pandemic, which necessitated consideration that the patient might be at risk for infection with the SARS-CoV-2 virus that causes COVID-19. Institutional protocols and algorithms that pertain to the evaluation of patients at risk for COVID-19 are in a state of rapid change based on information released by regulatory bodies including the CDC and federal and state organizations. These policies and algorithms were followed during the patient's care in the ED.  I personally reviewed patient's medical chart and all notes from triage and staff during today's encounter. I have also ordered and reviewed all labs and  imaging that I felt to be medically necessary in the evaluation of this patient's complaints and with consideration of their physical exam. If needed, translation services were available and utilized.   Patient is day 6 since onset symptoms in context of positive COVID-19 testing.  She is high risk given history of NICM with reduced ejection fraction, cardiac disease s/p pacemaker placement, DM, and COPD.  She is not immunized.  Patient is exhibiting mild increased work of breathing on exam, but she is maintaining good oxygen saturation on room air.  Even with ambulation, she was able to maintain saturations.  She did appear short of breath during ambulation in the room.  CMP with renal function consistent with if not improved from baseline.  D-dimer only mildly elevated at 0.75, normal if age-adjusted.  Chest x-ray is personally reviewed and without any acute cardiopulmonary disease.  EKG with right bundle branch block, consistent with prior tracings.    On my GU exam, there was gross melena.  Fecal occult POC test is with many lab and they marked it as "positive" but it has not yet resulted.  She states that she is taking her Eliquis, as directed.  She has seen a drop in her hemoglobin from 10.9- 9.3 over the course of the past couple of weeks, possible slow upper GI bleed.  Will initiate treatment with Protonix and  consult medicine for admission.  I spoke with Dr. Roosevelt Locks who will see and admit patient.   Final Clinical Impression(s) / ED Diagnoses Final diagnoses:  COVID-19  Melena    Rx / DC Orders ED Discharge Orders    None       Corena Herter, PA-C 02/27/21 Tommi Emery, MD 02/28/21 [phone]

## 2021-02-27 NOTE — ED Triage Notes (Addendum)
Pt bib [facility] from independent living facility for increased covid sx including shob, weakness, cough, and n/v/d. Pt tested positive for COVID 01/28/21. Pt presents shob and weak.

## 2021-02-28 ENCOUNTER — Inpatient Hospital Stay (HOSPITAL_COMMUNITY): Payer: Medicare HMO | Admitting: Certified Registered Nurse Anesthetist

## 2021-02-28 ENCOUNTER — Encounter (HOSPITAL_COMMUNITY): Payer: Self-pay | Admitting: Internal Medicine

## 2021-02-28 ENCOUNTER — Encounter (HOSPITAL_COMMUNITY)
Admission: EM | Disposition: A | Discharge status: Skilled Nursing Facility | Payer: Self-pay | Source: Home / Self Care | Attending: Internal Medicine

## 2021-02-28 DIAGNOSIS — U071 COVID-19: Principal | ICD-10-CM

## 2021-02-28 DIAGNOSIS — J449 Chronic obstructive pulmonary disease, unspecified: Secondary | ICD-10-CM | POA: Diagnosis not present

## 2021-02-28 DIAGNOSIS — I5022 Chronic systolic (congestive) heart failure: Secondary | ICD-10-CM | POA: Diagnosis not present

## 2021-02-28 DIAGNOSIS — D500 Iron deficiency anemia secondary to blood loss (chronic): Secondary | ICD-10-CM | POA: Diagnosis not present

## 2021-02-28 DIAGNOSIS — E119 Type 2 diabetes mellitus without complications: Secondary | ICD-10-CM | POA: Diagnosis not present

## 2021-02-28 DIAGNOSIS — I10 Essential (primary) hypertension: Secondary | ICD-10-CM | POA: Diagnosis not present

## 2021-02-28 DIAGNOSIS — E785 Hyperlipidemia, unspecified: Secondary | ICD-10-CM | POA: Diagnosis not present

## 2021-02-28 DIAGNOSIS — K921 Melena: Secondary | ICD-10-CM | POA: Diagnosis not present

## 2021-02-28 DIAGNOSIS — N184 Chronic kidney disease, stage 4 (severe): Secondary | ICD-10-CM | POA: Diagnosis not present

## 2021-02-28 DIAGNOSIS — I5040 Unspecified combined systolic (congestive) and diastolic (congestive) heart failure: Secondary | ICD-10-CM | POA: Diagnosis not present

## 2021-02-28 DIAGNOSIS — M810 Age-related osteoporosis without current pathological fracture: Secondary | ICD-10-CM | POA: Diagnosis not present

## 2021-02-28 DIAGNOSIS — E1122 Type 2 diabetes mellitus with diabetic chronic kidney disease: Secondary | ICD-10-CM | POA: Diagnosis not present

## 2021-02-28 HISTORY — PX: ESOPHAGOGASTRODUODENOSCOPY: SHX5428

## 2021-02-28 LAB — HEMOGLOBIN AND HEMATOCRIT, BLOOD
HCT: 26.1 % — ABNORMAL LOW (ref 36.0–46.0)
Hemoglobin: 8.4 g/dL — ABNORMAL LOW (ref 12.0–15.0)

## 2021-02-28 LAB — CBC
HCT: 26.2 % — ABNORMAL LOW (ref 36.0–46.0)
Hemoglobin: 8.4 g/dL — ABNORMAL LOW (ref 12.0–15.0)
MCH: 28.8 pg (ref 26.0–34.0)
MCHC: 32.1 g/dL (ref 30.0–36.0)
MCV: 89.7 fL (ref 80.0–100.0)
Platelets: 152 10*3/uL (ref 150–400)
RBC: 2.92 MIL/uL — ABNORMAL LOW (ref 3.87–5.11)
RDW: 16.5 % — ABNORMAL HIGH (ref 11.5–15.5)
WBC: 5.9 10*3/uL (ref 4.0–10.5)
nRBC: 0 % (ref 0.0–0.2)

## 2021-02-28 LAB — BASIC METABOLIC PANEL
Anion gap: 11 (ref 5–15)
BUN: 35 mg/dL — ABNORMAL HIGH (ref 8–23)
CO2: 24 mmol/L (ref 22–32)
Calcium: 8.7 mg/dL — ABNORMAL LOW (ref 8.9–10.3)
Chloride: 97 mmol/L — ABNORMAL LOW (ref 98–111)
Creatinine, Ser: 1.92 mg/dL — ABNORMAL HIGH (ref 0.44–1.00)
GFR, Estimated: 26 mL/min — ABNORMAL LOW (ref >60)
Glucose, Bld: 126 mg/dL — ABNORMAL HIGH (ref 70–99)
Potassium: 3.4 mmol/L — ABNORMAL LOW (ref 3.5–5.1)
Sodium: 132 mmol/L — ABNORMAL LOW (ref 135–145)

## 2021-02-28 LAB — SARS CORONAVIRUS 2 (TAT 6-24 HRS): SARS Coronavirus 2: POSITIVE — AB

## 2021-02-28 SURGERY — EGD (ESOPHAGOGASTRODUODENOSCOPY)
Anesthesia: Monitor Anesthesia Care

## 2021-02-28 MED ORDER — SODIUM CHLORIDE 0.9 % IV SOLN: INTRAVENOUS | Status: DC

## 2021-02-28 MED ORDER — ONDANSETRON HCL 4 MG/2ML IJ SOLN
INTRAMUSCULAR | Status: DC | PRN
  Administered 2021-02-28: 4 mg via INTRAVENOUS
  Administered 2021-02-28: 400 mg via INTRAVENOUS

## 2021-02-28 MED ORDER — SODIUM CHLORIDE 0.9 % IV SOLN
200.0000 mg | Freq: Once | INTRAVENOUS | Status: AC
  Administered 2021-02-28: 200 mg via INTRAVENOUS
  Filled 2021-02-28: qty 40

## 2021-02-28 MED ORDER — SODIUM CHLORIDE 0.9 % IV SOLN
100.0000 mg | Freq: Every day | INTRAVENOUS | Status: AC
  Administered 2021-03-01 – 2021-03-02 (×2): 100 mg via INTRAVENOUS
  Filled 2021-02-28 (×3): qty 20

## 2021-02-28 MED ORDER — PROPOFOL 10 MG/ML IV BOLUS
INTRAVENOUS | Status: DC | PRN
  Administered 2021-02-28: 100 mg via INTRAVENOUS

## 2021-02-28 MED ORDER — LACTATED RINGERS IV SOLN: INTRAVENOUS | Status: DC | PRN

## 2021-02-28 MED ORDER — PANTOPRAZOLE SODIUM 40 MG IV SOLR: 40.0000 mg | Freq: Two times a day (BID) | INTRAVENOUS | Status: DC

## 2021-02-28 MED ORDER — SODIUM CHLORIDE 0.9 % IV SOLN
8.0000 mg/h | INTRAVENOUS | Status: DC
  Administered 2021-02-28: 8 mg/h via INTRAVENOUS
  Filled 2021-02-28: qty 80

## 2021-02-28 MED ORDER — SUGAMMADEX SODIUM 200 MG/2ML IV SOLN
INTRAVENOUS | Status: DC | PRN
  Administered 2021-02-28: 100 mg via INTRAVENOUS

## 2021-02-28 MED ORDER — LIDOCAINE 2% (20 MG/ML) 5 ML SYRINGE
INTRAMUSCULAR | Status: DC | PRN
  Administered 2021-02-28: 60 mg via INTRAVENOUS

## 2021-02-28 MED ORDER — PANTOPRAZOLE SODIUM 40 MG IV SOLR
40.0000 mg | Freq: Two times a day (BID) | INTRAVENOUS | Status: DC
  Administered 2021-02-28 – 2021-03-03 (×6): 40 mg via INTRAVENOUS
  Filled 2021-02-28 (×6): qty 40

## 2021-02-28 MED ORDER — SODIUM CHLORIDE 0.9 % IV SOLN
80.0000 mg | Freq: Once | INTRAVENOUS | Status: AC
  Administered 2021-02-28: 80 mg via INTRAVENOUS
  Filled 2021-02-28: qty 80

## 2021-02-28 MED ORDER — SUCCINYLCHOLINE CHLORIDE 200 MG/10ML IV SOSY
PREFILLED_SYRINGE | INTRAVENOUS | Status: DC | PRN
  Administered 2021-02-28: 120 mg via INTRAVENOUS

## 2021-02-28 MED ORDER — ROCURONIUM BROMIDE 10 MG/ML (PF) SYRINGE
PREFILLED_SYRINGE | INTRAVENOUS | Status: DC | PRN
  Administered 2021-02-28: 10 mg via INTRAVENOUS

## 2021-02-28 NOTE — Consult Note (Signed)
Referring Provider: [facility] Primary Care Physician:  Leighton Ruff, MD Primary Gastroenterologist:  Dr. Cristina Gong [facility])  Reason for Consultation:  Anemia, melena  HPI: Krista Mason is a 85 y.o. female with history of chronic systolic CHF, A fib on Eliquis, GERD, esophagitis, esophageal ulcer, esophageal stenosis (s/p multiple dilations), CKD stage III, HTN, and COPD presenting for consultation of anemia and melena.  Patient states she started having COVID symptoms approximately 1 week ago and tested positive at home.  During the past week, she also notes black liquid stool and abdominal cramping. Abdominal cramping is worse after eating and drinking, and she has had some associated nausea and vomiting but denies coffee ground emesis or hematemesis.  Denies hematochezia.  She has a history of esophagitis, esophageal ulcer, and recurrent severe esophageal stenosis requiring multiple dilations.  CT showed RUQ subcutaneous lesion, which patient states is a sebaceous cyst which has been excised 2 times.  She takes Eliquis, last dose 5/9 at 11am.  Denies ASA or NSAID use.  Two most recent EGDs noted below:  EGD 06/2020: Moderately severe esophagitis. Benign-appearing esophageal stenosis, dilated. A single gastric polyp.  2 cm hiatal hernia.  EGD 05/2020: esophageal ulcer, benign-appearing esophageal stenosis, dilated, small hiatal hernia.  Past Medical History:  Diagnosis Date  . Anemia   . Atrial fibrillation ([place])   . Chronic combined systolic and diastolic CHF (congestive heart failure) ([place])    a. LV dysfcuntion felt to be related to PVCs  . Chronic kidney disease   . Diabetes mellitus without complication ([place])   . GERD (gastroesophageal reflux disease)   . Hx of echocardiogram    Echo (1/16):  EF 60-65%, no RWMA, Gr 1 DD, mod AI, mod MR, mild LAE  . Hyperkalemia   . Hyperlipidemia   . Hypertension   . NICM (nonischemic cardiomyopathy) ([place])    a. 12/2018 Echo: EF 35%, diff HK w/  septal-lateral dyssynchrony. Nl RV fxn; b. 07/2019 s/p MDT [id number] [name] CRT-P MRI SureScan device (ser #: [id number])  . Orthostatic hypotension   . PVC (premature ventricular contraction)    a. s/p PVC ablation in 11/2016  . Rheumatic fever   . Wears dentures     Past Surgical History:  Procedure Laterality Date  . BALLOON DILATION N/A 07/12/2020   Procedure: BALLOON DILATION;  Surgeon: Ronald Lobo, MD;  Location: [facility];  Service: Endoscopy;  Laterality: N/A;  . BIOPSY  08/20/2019   Procedure: BIOPSY;  Surgeon: Otis Brace, MD;  Location: [place] [facility];  Service: Gastroenterology;;  . BIV PACEMAKER INSERTION CRT-P N/A 07/24/2019   Procedure: BIV PACEMAKER INSERTION CRT-P;  Surgeon: Constance Haw, MD;  Location: [place] [facility];  Service: Cardiovascular;  Laterality: N/A;  . COLONOSCOPY WITH PROPOFOL N/A 08/22/2019   Procedure: COLONOSCOPY WITH PROPOFOL;  Surgeon: Laurence Spates, MD;  Location: [facility];  Service: Endoscopy;  Laterality: N/A;  . ESOPHAGOGASTRODUODENOSCOPY (EGD) WITH PROPOFOL N/A 09/25/2018   Procedure: ESOPHAGOGASTRODUODENOSCOPY (EGD) WITH PROPOFOL;  Surgeon: Ronald Lobo, MD;  [facility];  Service: Endoscopy;  Laterality: N/A;  . ESOPHAGOGASTRODUODENOSCOPY (EGD) WITH PROPOFOL N/A 10/29/2018   Procedure: ESOPHAGOGASTRODUODENOSCOPY (EGD) WITH PROPOFOL;  Surgeon: Ronald Lobo, MD;  [facility];  Service: Endoscopy;  Laterality: N/A;  . ESOPHAGOGASTRODUODENOSCOPY (EGD) WITH PROPOFOL N/A 08/20/2019   Procedure: ESOPHAGOGASTRODUODENOSCOPY (EGD) WITH PROPOFOL;  Surgeon: Otis Brace, MD;  Location: [facility];  Service: Gastroenterology;  Laterality: N/A;  . ESOPHAGOGASTRODUODENOSCOPY (EGD) WITH PROPOFOL N/A 05/03/2020   Procedure: ESOPHAGOGASTRODUODENOSCOPY (EGD) WITH  PROPOFOL;  Surgeon: Ronald Lobo, MD;  Location: [facility];  Service: Endoscopy;  Laterality: N/A;  . ESOPHAGOGASTRODUODENOSCOPY (EGD) WITH  PROPOFOL N/A 06/02/2020   Procedure: ESOPHAGOGASTRODUODENOSCOPY (EGD) WITH PROPOFOL and Savory Dilatation;  Surgeon: Clarene Essex, MD;  [facility];  Service: Gastroenterology;  Laterality: N/A;  . ESOPHAGOGASTRODUODENOSCOPY (EGD) WITH PROPOFOL N/A 07/12/2020   Procedure: ESOPHAGOGASTRODUODENOSCOPY (EGD) WITH PROPOFOL with dilatation;  Surgeon: Ronald Lobo, MD;  [facility];  Service: Endoscopy;  Laterality: N/A;  . HEMOSTASIS CLIP PLACEMENT  08/22/2019   Procedure: HEMOSTASIS CLIP PLACEMENT;  Surgeon: Laurence Spates, MD;  Location: [facility];  Service: Endoscopy;;  . KNEE SURGERY Left 2009  . OVARIAN CYST REMOVAL    . POLYPECTOMY  08/22/2019   Procedure: POLYPECTOMY;  Surgeon: Laurence Spates, MD;  Location: [place];  Service: Endoscopy;;  . PVC ABLATION N/A 11/30/2016   Procedure: PVC Ablation;  Surgeon: Will [name], MD;  Location: [place] [facility];  Service: Cardiovascular;  Laterality: N/A;  . RIGHT HEART CATH N/A 01/31/2018   Procedure: RIGHT HEART CATH;  Surgeon: Jolaine Artist, MD;  Location: [place] [facility];  Service: Cardiovascular;  Laterality: N/A;  . RIGHT/LEFT HEART CATH AND CORONARY ANGIOGRAPHY N/A 01/25/2017   Procedure: Right/Left Heart Cath and Coronary Angiography;  Surgeon: Jolaine Artist, MD;  Location: [place] [facility];  Service: Cardiovascular;  Laterality: N/A;  . SAVORY DILATION N/A 09/25/2018   Procedure: SAVORY DILATION;  Surgeon: Ronald Lobo, MD;  [facility];  Service: Endoscopy;  Laterality: N/A;  . SAVORY DILATION N/A 10/29/2018   Procedure: SAVORY DILATION;  Surgeon: Ronald Lobo, MD;  [facility];  Service: Endoscopy;  Laterality: N/A;  . SAVORY DILATION N/A 08/20/2019   Procedure: SAVORY DILATION;  Surgeon: Otis Brace, MD;  [facility]: [facility];  Service: Gastroenterology;  Laterality: N/A;  . SAVORY DILATION N/A 05/03/2020   Procedure: SAVORY DILATION;  Surgeon: Ronald Lobo,  MD;  [facility];  Service: Endoscopy;  Laterality: N/A;  Patient needs ultraslim pediatric upper endoscope/     no fluoro per dr. Rayne Du  . SAVORY DILATION N/A 06/02/2020   Procedure: SAVORY DILATION w/ Burtis Junes;  Surgeon: Clarene Essex, MD;  [facility];  Service: Gastroenterology;  Laterality: N/A;    Prior to Admission medications   Medication Sig Start Date End Date Taking? Authorizing Provider  acetaminophen (TYLENOL) 500 MG tablet Take 1,000 mg by mouth every 6 (six) hours as needed for moderate pain, headache or mild pain.   Yes [provider]  amiodarone (PACERONE) 100 MG tablet Take 100 mg by mouth every other day.   Yes [provider]  atorvastatin (LIPITOR) 20 MG tablet TAKE 1 TABLET BY MOUTH EVERY DAY Patient taking differently: Take 20 mg by mouth at bedtime. 12/04/19  Yes Bensimhon, Shaune Pascal, MD  carvedilol (COREG) 12.5 MG tablet Take 1 tablet (12.5 mg total) by mouth 2 (two) times daily with a meal. 05/04/20  Yes Bensimhon, Shaune Pascal, MD  Cholecalciferol (VITAMIN D3) 50 MCG (2000 UT) TABS Take 2,000 Units by mouth daily.   Yes [provider]  diphenhydrAMINE (BENADRYL) 25 mg capsule Take 25 mg by mouth every 6 (six) hours as needed for allergies.    Yes [provider]  ELIQUIS 2.5 MG TABS tablet TAKE 1 TABLET BY MOUTH TWICE A DAY Patient taking differently: Take 2.5 mg by mouth 2 (two) times daily. 06/14/20  Yes Fenton, Clint R, PA  feeding supplement, ENSURE ENLIVE, (ENSURE ENLIVE)  LIQD Take 237 mLs by mouth 2 (two) times daily between meals. Patient taking differently: Take 237 mLs by mouth at bedtime. 08/25/19  Yes Mikhail, [place], DO  hydrALAZINE (APRESOLINE) 50 MG tablet Take 50 mg by mouth 2 (two) times daily.   Yes [provider]  imiquimod (ALDARA) 5 % cream Apply 1 application topically as needed (as directed, for skin cancer sites). 07/06/20  Yes [provider]  ondansetron (ZOFRAN) 4 MG tablet  Take 1 tablet (4 mg total) by mouth every 6 (six) hours as needed for nausea or vomiting. 0/9/32  Yes Delora Fuel, MD  pantoprazole (PROTONIX) 40 MG tablet Take 1 tablet (40 mg total) by mouth 2 (two) times daily before a meal. 08/25/19  Yes Mikhail, [place], DO  Polyethyl Glycol-Propyl Glycol (SYSTANE OP) Place 1 drop into both eyes 3 (three) times daily as needed (dryness).    Yes [provider]  polyethylene glycol (MIRALAX / GLYCOLAX) 17 g packet Take 17 g by mouth daily as needed for mild constipation (MIX AS DIRECTED AND DRINK).   Yes [provider]  torsemide (DEMADEX) 20 MG tablet Take 20 mg by mouth in the morning. 11/17/20  Yes [provider]  vitamin B-12 (CYANOCOBALAMIN) 1000 MCG tablet Take 1 tablet (1,000 mcg total) by mouth daily. 08/25/19  Yes Mikhail, Velta Addison, DO  amiodarone (PACERONE) 200 MG tablet Take 0.5 tablets (100 mg total) by mouth every other day. Patient not taking: Reported on 02/27/2021 12/12/20   Constance Haw, MD    Scheduled Meds: . atorvastatin  20 mg Oral Daily  . benzonatate  100 mg Oral TID  . carvedilol  12.5 mg Oral BID WC  . feeding supplement  237 mL Oral BID BM  . guaiFENesin  1,200 mg Oral BID  . hydrALAZINE  50 mg Oral BID  . Ipratropium-Albuterol  1 puff Inhalation Q6H  . [START ON 03/03/2021] pantoprazole  40 mg Intravenous Q12H  . torsemide  20 mg Oral Daily   Continuous Infusions: . pantoprozole (PROTONIX) infusion    . pantoprazole (PROTONIX) 80 mg IVPB    . remdesivir 200 mg in sodium chloride 0.9% 250 mL IVPB     Followed by  . [START ON 03/01/2021] remdesivir 100 mg in NS 100 mL     PRN Meds:.acetaminophen, diphenhydrAMINE, ondansetron, polyethylene glycol  Allergies as of 02/27/2021 - Review Complete 02/27/2021  Allergen Reaction Noted  . Epoetin alfa Nausea Only and Other (See Comments) 12/08/2019  . Penicillins Itching and Rash 12/15/2012  . Retacrit [epoetin (alfa)] Other (See Comments) 12/08/2019   . Crestor [rosuvastatin] Nausea Only 02/22/2021  . Hydrocodone Itching and Other (See Comments) 02/22/2021  . Influenza virus vaccine split Swelling and Other (See Comments) 02/22/2021  . Tape Other (See Comments) 02/27/2021  . Cefaclor Itching 02/28/2012  . Crestor [rosuvastatin calcium] Nausea Only 10/23/2016  . Influenza vaccines Swelling and Other (See Comments) 09/17/2018  . Lidocaine hcl Palpitations and Other (See Comments) 10/23/2016  . Nsaids  12/15/2012  . Percocet [oxycodone-acetaminophen] Nausea And Vomiting 02/28/2012  . Prednisone Other (See Comments) 12/15/2012    Family History  Problem Relation Age of Onset  . Heart attack Father   . Heart disease Father   . Cancer Father   . Hypertension Father   . Hypertension Brother   . Stroke Brother   . Hypertension Brother   . Diabetes Son   . Hyperlipidemia Son   . Hypertension Son     Social History  Socioeconomic History  . Marital status: Widowed    Spouse name: Not on file  . Number of children: Not on file  . Years of education: Not on file  . Highest education level: Not on file  Occupational History  . Not on file  Tobacco Use  . Smoking status: Former Smoker    Packs/day: 0.75    Years: 60.00    Pack years: 45.00    Quit date: 11/04/2016    Years since quitting: 4.3  . Smokeless tobacco: Never Used  Vaping Use  . Vaping Use: Never used  Substance and Sexual Activity  . Alcohol use: No  . Drug use: No  . Sexual activity: Not on file  Other Topics Concern  . Not on file  Social History Narrative   Lives at [facility].   Social Determinants of Health   Financial Resource Strain: Not on file  Food Insecurity: Not on file  Transportation Needs: Not on file  Physical Activity: Not on file  Stress: Not on file  Social Connections: Not on file  Intimate Partner Violence: Not on file    Review of Systems: Review of Systems  Constitutional: Positive for fever and  malaise/fatigue.  HENT: Positive for congestion. Negative for sore throat.   Respiratory: Positive for cough. Negative for shortness of breath.   Cardiovascular: Negative for chest pain and palpitations.  Gastrointestinal: Positive for abdominal pain, diarrhea, melena, nausea and vomiting. Negative for blood in stool, constipation and heartburn.  Genitourinary: Negative for flank pain and hematuria.  Musculoskeletal: Negative for falls and joint pain.  Skin: Negative for itching and rash.  Neurological: Negative for seizures and loss of consciousness.  Endo/Heme/Allergies: Negative for polydipsia. Does not bruise/bleed easily.  Psychiatric/Behavioral: Negative for substance abuse. The patient is not nervous/anxious.     Physical Exam: Vital signs: Vitals:   02/28/21 0628 02/28/21 0900  BP: (!) 133/46 (!) 130/51  Pulse: 70 70  Resp: 19 18  Temp:    SpO2: 92% 93%     Physical Exam Vitals reviewed.  Constitutional:      General: She is not in acute distress. HENT:     Head: Normocephalic and atraumatic.     Nose: Nose normal. No congestion.     Mouth/Throat:     Mouth: Mucous membranes are moist.     Pharynx: Oropharynx is clear.  Eyes:     General: No scleral icterus.    Extraocular Movements: Extraocular movements intact.     Comments: Mild conjunctival pallor  Cardiovascular:     Rate and Rhythm: Normal rate and regular rhythm.     Pulses: Normal pulses.  Pulmonary:     Effort: Pulmonary effort is normal. No respiratory distress.  Abdominal:     General: There is no distension.     Palpations: Abdomen is soft. There is no mass.     Tenderness: There is abdominal tenderness (mild epigastric). There is no guarding or rebound.     Hernia: No hernia is present.     Comments: Small subcutaneous lesion in RUQ  Musculoskeletal:        General: No swelling or tenderness.     Cervical back: Normal range of motion and neck supple.  Skin:    General: Skin is warm and dry.   Neurological:     General: No focal deficit present.     Mental Status: She is alert and oriented to person, place, and time.  Psychiatric:  Mood and Affect: Mood normal.        Behavior: Behavior normal. Behavior is cooperative.     GI:  Lab Results: Recent Labs    02/27/21 1353 02/27/21 2330 02/28/21 0015  WBC 6.1  --  5.9  HGB 9.3* 8.4* 8.4*  HCT 29.4* 26.1* 26.2*  PLT 169  --  152   BMET Recent Labs    02/27/21 1353 02/28/21 0015  NA 134* 132*  K 3.9 3.4*  CL 101 97*  CO2 25 24  GLUCOSE 170* 126*  BUN 37* 35*  CREATININE 1.96* 1.92*  CALCIUM 9.2 8.7*   LFT Recent Labs    02/27/21 1353  PROT 6.3*  ALBUMIN 3.1*  AST 24  ALT 24  ALKPHOS 68  BILITOT 0.4   PT/INR Recent Labs    02/27/21 1849  LABPROT 17.0*  INR 1.4*     Studies/Results: CT ABDOMEN PELVIS WO CONTRAST  Result Date: 02/27/2021 CLINICAL DATA:  Nonlocalized abdominal pain EXAM: CT ABDOMEN AND PELVIS WITHOUT CONTRAST TECHNIQUE: Multidetector CT imaging of the abdomen and pelvis was performed following the standard protocol without IV contrast. COMPARISON:  CT 11/29/2020, radiograph 11/29/2020 FINDINGS: Lower chest: Lung bases demonstrate cardiomegaly with partially visualized intracardiac pacing leads. Trace pericardial effusion. No acute consolidation or effusion. Small hiatal hernia. Right upper quadrant 16 mm subcutaneous mass. Hepatobiliary: No focal hepatic abnormality. No biliary dilatation. Punctate stones in the gallbladder. Pancreas: Unremarkable. No pancreatic ductal dilatation or surrounding inflammatory changes. Spleen: Multiple granuloma Adrenals/Urinary Tract: Adrenal glands are normal. Kidneys show no hydronephrosis. Multiple low-attenuation renal lesions, probably cysts but further characterisation limited without contrast. Urinary bladder is unremarkable. Stomach/Bowel: Stomach nonenlarged. No dilated small bowel. No bowel wall thickening. Sigmoid colon diverticular disease  without acute inflammatory process. Vascular/Lymphatic: Advanced aortic atherosclerosis. Mild aneurysmal dilatation of the distal infrarenal abdominal aorta measuring up to 3.3 cm. No suspicious nodes. Reproductive: Uterus and bilateral adnexa are unremarkable. Other: Negative for free air or free fluid. Musculoskeletal: Chronic superior endplate deformity at L1. No acute osseous abnormality. IMPRESSION: 1. No CT evidence for acute intra-abdominal or pelvic abnormality. 2. Cardiomegaly with trace pericardial effusion. 3. Sigmoid colon diverticular disease without acute inflammatory process 4. Infrarenal abdominal aortic aneurysm up to 3.3 cm. Recommend follow-up ultrasound every 3 years. This recommendation follows ACR consensus guidelines: White Paper of the ACR Incidental Findings Committee II on Vascular Findings. J Am Coll Radiol 2013; 10:789-794. 5. Right upper quadrant 16 mm subcutaneous mass which may be correlated with physical exam 6. Probable small gallstones Electronically Signed   By: Donavan Foil M.D.   On: 02/27/2021 22:13   DG Chest Portable 1 View  Result Date: 02/27/2021 CLINICAL DATA:  Short of breath.  COVID-19 positive. EXAM: PORTABLE CHEST 1 VIEW COMPARISON:  08/18/2019. FINDINGS: Cardiac silhouette mildly enlarged. Stable left anterior chest wall biventricular cardioverter-defibrillator. Dense calcification along the thoracic aorta and in the AP window, stable. No mediastinal or hilar masses. Lungs hyperexpanded, but clear. No pleural effusion or pneumothorax. Skeletal structures are grossly intact. IMPRESSION: 1. No acute cardiopulmonary disease. Electronically Signed   By: Lajean Manes M.D.   On: 02/27/2021 14:05    Impression: Anemia, melena, history of esophagitis, esophageal ulcer, and esophageal stricture -Hgb 8.4, decreased from baseline 10-11 one month ago  A fib, on Eliquis, last dose 5/9 at 11am  COVID-19  CKD: BUN 35/ Cr 1.92  CT showed RUQ subcutaneous lesion,  which patient states is a sebaceous cyst which has been excised 2 times.  Incidental finding of gallstones on CT imaging  Plan: Proceed with EGD today for further evaluation of anemia and melena.  Possible dilation if stricture is present and amenable to dilation.  I thoroughly discussed the procedure with the patient to include nature, alternatives, benefits, and risks (including but not limited to bleeding, infection, perforation, anesthesia/cardiac and pulmonary complications).  Patient verbalized understanding and gave verbal consent to proceed with EGD and dilation.  Continue Protonix IV.  NPO until post procedure.    LOS: 1 day   Salley Slaughter  PA-C 02/28/2021, 11:15 AM  Contact #  [phone]

## 2021-02-28 NOTE — Anesthesia Preprocedure Evaluation (Addendum)
Anesthesia Evaluation  Patient identified by MRN, date of birth, ID band Patient awake    Reviewed: Allergy & Precautions, NPO status , Patient's Chart, lab work & pertinent test results, reviewed documented beta blocker date and time   Airway Mallampati: III  TM Distance: >3 FB Neck ROM: Full    Dental  (+) Edentulous Upper, Edentulous Lower   Pulmonary COPD, former smoker,    Pulmonary exam normal breath sounds clear to auscultation       Cardiovascular hypertension, Pt. on home beta blockers and Pt. on medications + Peripheral Vascular Disease and +CHF  Normal cardiovascular exam+ dysrhythmias (on eliquis) Atrial Fibrillation + pacemaker (for CHB)  Rhythm:Regular Rate:Normal  TTE 2021 1. Left ventricular ejection fraction, by estimation, is 45 to 50%. The  left ventricle has mildly decreased function. The left ventricle  demonstrates global hypokinesis. There is mild concentric left ventricular  hypertrophy. Left ventricular diastolic  function could not be evaluated. Left ventricular diastolic function could  not be evaluated.  2. Right ventricular systolic function is normal. The right ventricular  size is normal. There is normal pulmonary artery systolic pressure. The  estimated right ventricular systolic pressure is 16.9 mmHg.  3. The mitral valve appears rheumatic in the PLAX views, but likely just  degenerative. The mitral valve is degenerative. Mild to moderate mitral  valve regurgitation. Mild mitral stenosis. The mean mitral valve gradient  is 3.5 mmHg with average heart  rate of 70 bpm.  4. The aortic valve is tricuspid. Aortic valve regurgitation is mild.  Mild to moderate aortic valve sclerosis/calcification is present, without  any evidence of aortic stenosis.  5. The inferior vena cava is normal in size with greater than 50%  respiratory variability, suggesting right atrial pressure of 3 mmHg    Neuro/Psych negative neurological ROS  negative psych ROS   GI/Hepatic Neg liver ROS, GERD  ,  Endo/Other  negative endocrine ROSdiabetes  Renal/GU Renal InsufficiencyRenal disease (Cr 1.92)  negative genitourinary   Musculoskeletal negative musculoskeletal ROS (+)   Abdominal   Peds  Hematology  (+) Blood dyscrasia (Hgb 8.4), anemia ,   Anesthesia Other Findings Presented with SOB, cough and congestion found to be COVID positive. Also complained of black tarry stools for 1-2 weeks with anemia Hgb 8.4. Plan for EGD.    Reproductive/Obstetrics                           Anesthesia Physical Anesthesia Plan  ASA: III  Anesthesia Plan: General   Post-op Pain Management:    Induction: Intravenous and Rapid sequence  PONV Risk Score and Plan: 3 and Dexamethasone, Ondansetron and Treatment may vary due to age or medical condition  Airway Management Planned: Oral ETT  Additional Equipment:   Intra-op Plan:   Post-operative Plan: Extubation in OR  Informed Consent: I have reviewed the patients History and Physical, chart, labs and discussed the procedure including the risks, benefits and alternatives for the proposed anesthesia with the patient or authorized representative who has indicated his/her understanding and acceptance.     Dental advisory given  Plan Discussed with: CRNA  Anesthesia Plan Comments:         Anesthesia Quick Evaluation

## 2021-02-28 NOTE — Anesthesia Postprocedure Evaluation (Signed)
Anesthesia Post Note  Patient: Krista Mason  Procedure(s) Performed: ESOPHAGOGASTRODUODENOSCOPY (EGD) (N/A )     Patient location during evaluation: Endoscopy Anesthesia Type: General Level of consciousness: awake and alert Pain management: pain level controlled Vital Signs Assessment: post-procedure vital signs reviewed and stable Respiratory status: spontaneous breathing, nonlabored ventilation, respiratory function stable and patient connected to nasal cannula oxygen Cardiovascular status: blood pressure returned to baseline and stable Postop Assessment: no apparent nausea or vomiting Anesthetic complications: no   No complications documented.  Last Vitals:  Vitals:   02/28/21 1419 02/28/21 1426  BP: (!) 117/48 (!) 118/51  Pulse: 70 70  Resp:    Temp:    SpO2: 94% 94%    Last Pain:  Vitals:   02/28/21 1426  TempSrc:   PainSc: 0-No pain                 Perrin Eddleman L Kalen Ratajczak

## 2021-02-28 NOTE — Brief Op Note (Signed)
Severe distal ulcerated esophageal stricture. See endopro note for details. Start Carafate slurry when tolerating clear liquid diet and if unable to tolerate clear liquid diet then start Carafate now. Changed to PPI IV Q 12 hours. Slowly advance in 1-2 days to soft diet. Supportive care. [facility] will f/u.

## 2021-02-28 NOTE — Op Note (Signed)
[facility] Patient Name: Krista Mason Procedure Date : 02/28/2021 MRN: [id number] Attending MD: Lear Ng , MD Date of Birth: 02-Feb-1936 CSN: [id number] Age: 85 Admit Type: Inpatient Procedure:                Upper GI endoscopy Indications:              Acute post hemorrhagic anemia, Melena Providers:                Lear Ng, MD, Elmer Ramp. Tilden Dome, RN, Elspeth Cho Tech., Technician, Mora Appl, CRNA Referring MD:             hospital team Medicines:                Propofol per Anesthesia, Monitored Anesthesia Care;                            Patient intubated prior to the procedure Complications:            No immediate complications. Estimated Blood Loss:     Estimated blood loss was minimal. Procedure:                Pre-Anesthesia Assessment:                           - Prior to the procedure, a History and Physical                            was performed, and patient medications and                            allergies were reviewed. The patient's tolerance of                            previous anesthesia was also reviewed. The risks                            and benefits of the procedure and the sedation                            options and risks were discussed with the patient.                            All questions were answered, and informed consent                            was obtained. Prior Anticoagulants: The patient has                            taken Eliquis (apixaban), last dose was 1 day prior                            to procedure. ASA Grade Assessment: III - A patient  with severe systemic disease. After reviewing the                            risks and benefits, the patient was deemed in                            satisfactory condition to undergo the procedure.                           After obtaining informed consent, the endoscope was                             passed under direct vision. Throughout the                            procedure, the patient's blood pressure, pulse, and                            oxygen saturations were monitored continuously. The                            GIF-H190 ([id number]) Olympus gastroscope was                            introduced through the mouth, and advanced to the                            second part of duodenum. The upper GI endoscopy was                            accomplished without difficulty. The patient                            tolerated the procedure well. Findings:      One benign-appearing, intrinsic moderate (circumferential scarring or       stenosis; an endoscope may pass) stenosis was found 37 to 44 cm from the       incisors. This stenosis measured 7 cm (in length). The stenosis was       traversed.      Segmental severe mucosal changes characterized by congestion, erythema       and ulceration were found in the distal esophagus.      Passage of the standard endoscope caused superficial rents in the mucosa       consistent with dilation by passage of the endoscope into the stomach.      A few small mucosal papules (nodules) with no bleeding and no stigmata       of recent bleeding were found in the gastric body.      Segmental mild inflammation characterized by congestion (edema) and       erythema was found in the prepyloric region of the stomach.      The cardia and gastric fundus were normal on retroflexion.      The examined duodenum was normal. Impression:               - Benign-appearing esophageal stenosis.                           -  Congested, erythematous, ulcerated mucosa in the                            esophagus.                           - A few mucosal papules (nodules) found in the                            stomach.                           - Acute gastritis.                           - Normal examined duodenum.                           - No specimens collected.                            - Suspect melena due to ulcerated distal esophageal                            stricture. Recommendation:           - Clear liquid diet.                           - Observe patient's clinical course.                           - Consider outpt dilation if unable to tolerate                            soft diet in the future. PPI IV BID. Procedure Code(s):        --- Professional ---                           [phone], Esophagogastroduodenoscopy, flexible,                            transoral; diagnostic, including collection of                            specimen(s) by brushing or washing, when performed                            (separate procedure) Diagnosis Code(s):        --- Professional ---                           K92.1, Melena (includes Hematochezia)                           D62, Acute posthemorrhagic anemia                           K29.00, Acute  gastritis without bleeding                           K22.10, Ulcer of esophagus without bleeding                           K22.2, Esophageal obstruction                           K22.8, Other specified diseases of esophagus                           K31.89, Other diseases of stomach and duodenum CPT copyright 2019 American Medical Association. All rights reserved. The codes documented in this report are preliminary and upon coder review may  be revised to meet current compliance requirements. Lear Ng, MD 02/28/2021 2:16:07 PM This report has been signed electronically. Number of Addenda: 0

## 2021-02-28 NOTE — Interval H&P Note (Signed)
History and Physical Interval Note:  02/28/2021 1:46 PM  Krista Mason  has presented today for surgery, with the diagnosis of anemia, melena.  The various methods of treatment have been discussed with the patient and family. After consideration of risks, benefits and other options for treatment, the patient has consented to  Procedure(s): ESOPHAGOGASTRODUODENOSCOPY (EGD) (N/A) as a surgical intervention.  The patient's history has been reviewed, patient examined, no change in status, stable for surgery.  I have reviewed the patient's chart and labs.  Questions were answered to the patient's satisfaction.     Lear Ng

## 2021-02-28 NOTE — H&P (View-Only) (Signed)
Referring Provider: [facility] Primary Care Physician:  Leighton Ruff, MD Primary Gastroenterologist:  Dr. Cristina Gong [facility])  Reason for Consultation:  Anemia, melena  HPI: Krista Mason is a 85 y.o. female with history of chronic systolic CHF, A fib on Eliquis, GERD, esophagitis, esophageal ulcer, esophageal stenosis (s/p multiple dilations), CKD stage III, HTN, and COPD presenting for consultation of anemia and melena.  Patient states she started having COVID symptoms approximately 1 week ago and tested positive at home.  During the past week, she also notes black liquid stool and abdominal cramping. Abdominal cramping is worse after eating and drinking, and she has had some associated nausea and vomiting but denies coffee ground emesis or hematemesis.  Denies hematochezia.  She has a history of esophagitis, esophageal ulcer, and recurrent severe esophageal stenosis requiring multiple dilations.  CT showed RUQ subcutaneous lesion, which patient states is a sebaceous cyst which has been excised 2 times.  She takes Eliquis, last dose 5/9 at 11am.  Denies ASA or NSAID use.  Two most recent EGDs noted below:  EGD 06/2020: Moderately severe esophagitis. Benign-appearing esophageal stenosis, dilated. A single gastric polyp.  2 cm hiatal hernia.  EGD 05/2020: esophageal ulcer, benign-appearing esophageal stenosis, dilated, small hiatal hernia.  Past Medical History:  Diagnosis Date  . Anemia   . Atrial fibrillation ([place])   . Chronic combined systolic and diastolic CHF (congestive heart failure) ([place])    a. LV dysfcuntion felt to be related to PVCs  . Chronic kidney disease   . Diabetes mellitus without complication ([place])   . GERD (gastroesophageal reflux disease)   . Hx of echocardiogram    Echo (1/16):  EF 60-65%, no RWMA, Gr 1 DD, mod AI, mod MR, mild LAE  . Hyperkalemia   . Hyperlipidemia   . Hypertension   . NICM (nonischemic cardiomyopathy) ([place])    a. 12/2018 Echo: EF 35%, diff HK w/  septal-lateral dyssynchrony. Nl RV fxn; b. 07/2019 s/p MDT [id number] [name] CRT-P MRI SureScan device (ser #: [id number])  . Orthostatic hypotension   . PVC (premature ventricular contraction)    a. s/p PVC ablation in 11/2016  . Rheumatic fever   . Wears dentures     Past Surgical History:  Procedure Laterality Date  . BALLOON DILATION N/A 07/12/2020   Procedure: BALLOON DILATION;  Surgeon: Ronald Lobo, MD;  [facility];  Service: Endoscopy;  Laterality: N/A;  . BIOPSY  08/20/2019   Procedure: BIOPSY;  Surgeon: Otis Brace, MD;  Location: [place] ENDOSCOPY;  Service: Gastroenterology;;  . BIV PACEMAKER INSERTION CRT-P N/A 07/24/2019   Procedure: BIV PACEMAKER INSERTION CRT-P;  Surgeon: Constance Haw, MD;  Location: [place] CV LAB;  Service: Cardiovascular;  Laterality: N/A;  . COLONOSCOPY WITH PROPOFOL N/A 08/22/2019   Procedure: COLONOSCOPY WITH PROPOFOL;  Surgeon: Laurence Spates, MD;  Location: [place];  Service: Endoscopy;  Laterality: N/A;  . ESOPHAGOGASTRODUODENOSCOPY (EGD) WITH PROPOFOL N/A 09/25/2018   Procedure: ESOPHAGOGASTRODUODENOSCOPY (EGD) WITH PROPOFOL;  Surgeon: Ronald Lobo, MD;  [facility];  Service: Endoscopy;  Laterality: N/A;  . ESOPHAGOGASTRODUODENOSCOPY (EGD) WITH PROPOFOL N/A 10/29/2018   Procedure: ESOPHAGOGASTRODUODENOSCOPY (EGD) WITH PROPOFOL;  Surgeon: Ronald Lobo, MD;  [facility];  Service: Endoscopy;  Laterality: N/A;  . ESOPHAGOGASTRODUODENOSCOPY (EGD) WITH PROPOFOL N/A 08/20/2019   Procedure: ESOPHAGOGASTRODUODENOSCOPY (EGD) WITH PROPOFOL;  Surgeon: Otis Brace, MD;  [facility]: [facility];  Service: Gastroenterology;  Laterality: N/A;  . ESOPHAGOGASTRODUODENOSCOPY (EGD) WITH PROPOFOL N/A 05/03/2020   Procedure: ESOPHAGOGASTRODUODENOSCOPY (EGD) WITH  PROPOFOL;  Surgeon: Ronald Lobo, MD;  Location: [facility];  Service: Endoscopy;  Laterality: N/A;  . ESOPHAGOGASTRODUODENOSCOPY (EGD) WITH  PROPOFOL N/A 06/02/2020   Procedure: ESOPHAGOGASTRODUODENOSCOPY (EGD) WITH PROPOFOL and Savory Dilatation;  Surgeon: Clarene Essex, MD;  [facility];  Service: Gastroenterology;  Laterality: N/A;  . ESOPHAGOGASTRODUODENOSCOPY (EGD) WITH PROPOFOL N/A 07/12/2020   Procedure: ESOPHAGOGASTRODUODENOSCOPY (EGD) WITH PROPOFOL with dilatation;  Surgeon: Ronald Lobo, MD;  [facility];  Service: Endoscopy;  Laterality: N/A;  . HEMOSTASIS CLIP PLACEMENT  08/22/2019   Procedure: HEMOSTASIS CLIP PLACEMENT;  Surgeon: Laurence Spates, MD;  Location: [facility] ENDOSCOPY;  Service: Endoscopy;;  . KNEE SURGERY Left 2009  . OVARIAN CYST REMOVAL    . POLYPECTOMY  08/22/2019   Procedure: POLYPECTOMY;  Surgeon: Laurence Spates, MD;  Location: [place];  Service: Endoscopy;;  . PVC ABLATION N/A 11/30/2016   Procedure: PVC Ablation;  Surgeon: Will [name], MD;  Location: [place] CV LAB;  Service: Cardiovascular;  Laterality: N/A;  . RIGHT HEART CATH N/A 01/31/2018   Procedure: RIGHT HEART CATH;  Surgeon: Jolaine Artist, MD;  Location: [place] CV LAB;  Service: Cardiovascular;  Laterality: N/A;  . RIGHT/LEFT HEART CATH AND CORONARY ANGIOGRAPHY N/A 01/25/2017   Procedure: Right/Left Heart Cath and Coronary Angiography;  Surgeon: Jolaine Artist, MD;  Location: [place] CV LAB;  Service: Cardiovascular;  Laterality: N/A;  . SAVORY DILATION N/A 09/25/2018   Procedure: SAVORY DILATION;  Surgeon: Ronald Lobo, MD;  [facility];  Service: Endoscopy;  Laterality: N/A;  . SAVORY DILATION N/A 10/29/2018   Procedure: SAVORY DILATION;  Surgeon: Ronald Lobo, MD;  [facility];  Service: Endoscopy;  Laterality: N/A;  . SAVORY DILATION N/A 08/20/2019   Procedure: SAVORY DILATION;  Surgeon: Otis Brace, MD;  Location: [facility];  Service: Gastroenterology;  Laterality: N/A;  . SAVORY DILATION N/A 05/03/2020   Procedure: SAVORY DILATION;  Surgeon: Ronald Lobo,  MD;  [facility];  Service: Endoscopy;  Laterality: N/A;  Patient needs ultraslim pediatric upper endoscope/     no fluoro per dr. Rayne Du  . SAVORY DILATION N/A 06/02/2020   Procedure: SAVORY DILATION w/ Burtis Junes;  Surgeon: Clarene Essex, MD;  [facility];  Service: Gastroenterology;  Laterality: N/A;    Prior to Admission medications   Medication Sig Start Date End Date Taking? Authorizing Provider  acetaminophen (TYLENOL) 500 MG tablet Take 1,000 mg by mouth every 6 (six) hours as needed for moderate pain, headache or mild pain.   Yes [provider]  amiodarone (PACERONE) 100 MG tablet Take 100 mg by mouth every other day.   Yes [provider]  atorvastatin (LIPITOR) 20 MG tablet TAKE 1 TABLET BY MOUTH EVERY DAY Patient taking differently: Take 20 mg by mouth at bedtime. 12/04/19  Yes Bensimhon, Shaune Pascal, MD  carvedilol (COREG) 12.5 MG tablet Take 1 tablet (12.5 mg total) by mouth 2 (two) times daily with a meal. 05/04/20  Yes Bensimhon, Shaune Pascal, MD  Cholecalciferol (VITAMIN D3) 50 MCG (2000 UT) TABS Take 2,000 Units by mouth daily.   Yes [provider]  diphenhydrAMINE (BENADRYL) 25 mg capsule Take 25 mg by mouth every 6 (six) hours as needed for allergies.    Yes [provider]  ELIQUIS 2.5 MG TABS tablet TAKE 1 TABLET BY MOUTH TWICE A DAY Patient taking differently: Take 2.5 mg by mouth 2 (two) times daily. 06/14/20  Yes Fenton, Clint R, PA  feeding supplement, ENSURE ENLIVE, (ENSURE ENLIVE)  LIQD Take 237 mLs by mouth 2 (two) times daily between meals. Patient taking differently: Take 237 mLs by mouth at bedtime. 08/25/19  Yes Mikhail, Waller, DO  hydrALAZINE (APRESOLINE) 50 MG tablet Take 50 mg by mouth 2 (two) times daily.   Yes [provider]  imiquimod (ALDARA) 5 % cream Apply 1 application topically as needed (as directed, for skin cancer sites). 07/06/20  Yes [provider]  ondansetron (ZOFRAN) 4 MG tablet  Take 1 tablet (4 mg total) by mouth every 6 (six) hours as needed for nausea or vomiting. 04/28/28  Yes Delora Fuel, MD  pantoprazole (PROTONIX) 40 MG tablet Take 1 tablet (40 mg total) by mouth 2 (two) times daily before a meal. 08/25/19  Yes Mikhail, [place], DO  Polyethyl Glycol-Propyl Glycol (SYSTANE OP) Place 1 drop into both eyes 3 (three) times daily as needed (dryness).    Yes [provider]  polyethylene glycol (MIRALAX / GLYCOLAX) 17 g packet Take 17 g by mouth daily as needed for mild constipation (MIX AS DIRECTED AND DRINK).   Yes [provider]  torsemide (DEMADEX) 20 MG tablet Take 20 mg by mouth in the morning. 11/17/20  Yes [provider]  vitamin B-12 (CYANOCOBALAMIN) 1000 MCG tablet Take 1 tablet (1,000 mcg total) by mouth daily. 08/25/19  Yes Mikhail, Velta Addison, DO  amiodarone (PACERONE) 200 MG tablet Take 0.5 tablets (100 mg total) by mouth every other day. Patient not taking: Reported on 02/27/2021 12/12/20   Constance Haw, MD    Scheduled Meds: . atorvastatin  20 mg Oral Daily  . benzonatate  100 mg Oral TID  . carvedilol  12.5 mg Oral BID WC  . feeding supplement  237 mL Oral BID BM  . guaiFENesin  1,200 mg Oral BID  . hydrALAZINE  50 mg Oral BID  . Ipratropium-Albuterol  1 puff Inhalation Q6H  . [START ON 03/03/2021] pantoprazole  40 mg Intravenous Q12H  . torsemide  20 mg Oral Daily   Continuous Infusions: . pantoprozole (PROTONIX) infusion    . pantoprazole (PROTONIX) 80 mg IVPB    . remdesivir 200 mg in sodium chloride 0.9% 250 mL IVPB     Followed by  . [START ON 03/01/2021] remdesivir 100 mg in NS 100 mL     PRN Meds:.acetaminophen, diphenhydrAMINE, ondansetron, polyethylene glycol  Allergies as of 02/27/2021 - Review Complete 02/27/2021  Allergen Reaction Noted  . Epoetin alfa Nausea Only and Other (See Comments) 12/08/2019  . Penicillins Itching and Rash 12/15/2012  . Retacrit [epoetin (alfa)] Other (See Comments) 12/08/2019   . Crestor [rosuvastatin] Nausea Only 02/22/2021  . Hydrocodone Itching and Other (See Comments) 02/22/2021  . Influenza virus vaccine split Swelling and Other (See Comments) 02/22/2021  . Tape Other (See Comments) 02/27/2021  . Cefaclor Itching 02/28/2012  . Crestor [rosuvastatin calcium] Nausea Only 10/23/2016  . Influenza vaccines Swelling and Other (See Comments) 09/17/2018  . Lidocaine hcl Palpitations and Other (See Comments) 10/23/2016  . Nsaids  12/15/2012  . Percocet [oxycodone-acetaminophen] Nausea And Vomiting 02/28/2012  . Prednisone Other (See Comments) 12/15/2012    Family History  Problem Relation Age of Onset  . Heart attack Father   . Heart disease Father   . Cancer Father   . Hypertension Father   . Hypertension Brother   . Stroke Brother   . Hypertension Brother   . Diabetes Son   . Hyperlipidemia Son   . Hypertension Son     Social History  Socioeconomic History  . Marital status: Widowed    Spouse name: Not on file  . Number of children: Not on file  . Years of education: Not on file  . Highest education level: Not on file  Occupational History  . Not on file  Tobacco Use  . Smoking status: Former Smoker    Packs/day: 0.75    Years: 60.00    Pack years: 45.00    Quit date: 11/04/2016    Years since quitting: 4.3  . Smokeless tobacco: Never Used  Vaping Use  . Vaping Use: Never used  Substance and Sexual Activity  . Alcohol use: No  . Drug use: No  . Sexual activity: Not on file  Other Topics Concern  . Not on file  Social History Narrative   [facility].   Social Determinants of Health   Financial Resource Strain: Not on file  Food Insecurity: Not on file  Transportation Needs: Not on file  Physical Activity: Not on file  Stress: Not on file  Social Connections: Not on file  Intimate Partner Violence: Not on file    Review of Systems: Review of Systems  Constitutional: Positive for fever and  malaise/fatigue.  HENT: Positive for congestion. Negative for sore throat.   Respiratory: Positive for cough. Negative for shortness of breath.   Cardiovascular: Negative for chest pain and palpitations.  Gastrointestinal: Positive for abdominal pain, diarrhea, melena, nausea and vomiting. Negative for blood in stool, constipation and heartburn.  Genitourinary: Negative for flank pain and hematuria.  Musculoskeletal: Negative for falls and joint pain.  Skin: Negative for itching and rash.  Neurological: Negative for seizures and loss of consciousness.  Endo/Heme/Allergies: Negative for polydipsia. Does not bruise/bleed easily.  Psychiatric/Behavioral: Negative for substance abuse. The patient is not nervous/anxious.     Physical Exam: Vital signs: Vitals:   02/28/21 0628 02/28/21 0900  BP: (!) 133/46 (!) 130/51  Pulse: 70 70  Resp: 19 18  Temp:    SpO2: 92% 93%     Physical Exam Vitals reviewed.  Constitutional:      General: She is not in acute distress. HENT:     Head: Normocephalic and atraumatic.     Nose: Nose normal. No congestion.     Mouth/Throat:     Mouth: Mucous membranes are moist.     Pharynx: Oropharynx is clear.  Eyes:     General: No scleral icterus.    Extraocular Movements: Extraocular movements intact.     Comments: Mild conjunctival pallor  Cardiovascular:     Rate and Rhythm: Normal rate and regular rhythm.     Pulses: Normal pulses.  Pulmonary:     Effort: Pulmonary effort is normal. No respiratory distress.  Abdominal:     General: There is no distension.     Palpations: Abdomen is soft. There is no mass.     Tenderness: There is abdominal tenderness (mild epigastric). There is no guarding or rebound.     Hernia: No hernia is present.     Comments: Small subcutaneous lesion in RUQ  Musculoskeletal:        General: No swelling or tenderness.     Cervical back: Normal range of motion and neck supple.  Skin:    General: Skin is warm and dry.   Neurological:     General: No focal deficit present.     Mental Status: She is alert and oriented to person, place, and time.  Psychiatric:  Mood and Affect: Mood normal.        Behavior: Behavior normal. Behavior is cooperative.     GI:  Lab Results: Recent Labs    02/27/21 1353 02/27/21 2330 02/28/21 0015  WBC 6.1  --  5.9  HGB 9.3* 8.4* 8.4*  HCT 29.4* 26.1* 26.2*  PLT 169  --  152   BMET Recent Labs    02/27/21 1353 02/28/21 0015  NA 134* 132*  K 3.9 3.4*  CL 101 97*  CO2 25 24  GLUCOSE 170* 126*  BUN 37* 35*  CREATININE 1.96* 1.92*  CALCIUM 9.2 8.7*   LFT Recent Labs    02/27/21 1353  PROT 6.3*  ALBUMIN 3.1*  AST 24  ALT 24  ALKPHOS 68  BILITOT 0.4   PT/INR Recent Labs    02/27/21 1849  LABPROT 17.0*  INR 1.4*     Studies/Results: CT ABDOMEN PELVIS WO CONTRAST  Result Date: 02/27/2021 CLINICAL DATA:  Nonlocalized abdominal pain EXAM: CT ABDOMEN AND PELVIS WITHOUT CONTRAST TECHNIQUE: Multidetector CT imaging of the abdomen and pelvis was performed following the standard protocol without IV contrast. COMPARISON:  CT 11/29/2020, radiograph 11/29/2020 FINDINGS: Lower chest: Lung bases demonstrate cardiomegaly with partially visualized intracardiac pacing leads. Trace pericardial effusion. No acute consolidation or effusion. Small hiatal hernia. Right upper quadrant 16 mm subcutaneous mass. Hepatobiliary: No focal hepatic abnormality. No biliary dilatation. Punctate stones in the gallbladder. Pancreas: Unremarkable. No pancreatic ductal dilatation or surrounding inflammatory changes. Spleen: Multiple granuloma Adrenals/Urinary Tract: Adrenal glands are normal. Kidneys show no hydronephrosis. Multiple low-attenuation renal lesions, probably cysts but further characterisation limited without contrast. Urinary bladder is unremarkable. Stomach/Bowel: Stomach nonenlarged. No dilated small bowel. No bowel wall thickening. Sigmoid colon diverticular disease  without acute inflammatory process. Vascular/Lymphatic: Advanced aortic atherosclerosis. Mild aneurysmal dilatation of the distal infrarenal abdominal aorta measuring up to 3.3 cm. No suspicious nodes. Reproductive: Uterus and bilateral adnexa are unremarkable. Other: Negative for free air or free fluid. Musculoskeletal: Chronic superior endplate deformity at L1. No acute osseous abnormality. IMPRESSION: 1. No CT evidence for acute intra-abdominal or pelvic abnormality. 2. Cardiomegaly with trace pericardial effusion. 3. Sigmoid colon diverticular disease without acute inflammatory process 4. Infrarenal abdominal aortic aneurysm up to 3.3 cm. Recommend follow-up ultrasound every 3 years. This recommendation follows ACR consensus guidelines: White Paper of the ACR Incidental Findings Committee II on Vascular Findings. J Am Coll Radiol 2013; 10:789-794. 5. Right upper quadrant 16 mm subcutaneous mass which may be correlated with physical exam 6. Probable small gallstones Electronically Signed   By: Donavan Foil M.D.   On: 02/27/2021 22:13   DG Chest Portable 1 View  Result Date: 02/27/2021 CLINICAL DATA:  Short of breath.  COVID-19 positive. EXAM: PORTABLE CHEST 1 VIEW COMPARISON:  08/18/2019. FINDINGS: Cardiac silhouette mildly enlarged. Stable left anterior chest wall biventricular cardioverter-defibrillator. Dense calcification along the thoracic aorta and in the AP window, stable. No mediastinal or hilar masses. Lungs hyperexpanded, but clear. No pleural effusion or pneumothorax. Skeletal structures are grossly intact. IMPRESSION: 1. No acute cardiopulmonary disease. Electronically Signed   By: Lajean Manes M.D.   On: 02/27/2021 14:05    Impression: Anemia, melena, history of esophagitis, esophageal ulcer, and esophageal stricture -Hgb 8.4, decreased from baseline 10-11 one month ago  A fib, on Eliquis, last dose 5/9 at 11am  COVID-19  CKD: BUN 35/ Cr 1.92  CT showed RUQ subcutaneous lesion,  which patient states is a sebaceous cyst which has been excised 2 times.  Incidental finding of gallstones on CT imaging  Plan: Proceed with EGD today for further evaluation of anemia and melena.  Possible dilation if stricture is present and amenable to dilation.  I thoroughly discussed the procedure with the patient to include nature, alternatives, benefits, and risks (including but not limited to bleeding, infection, perforation, anesthesia/cardiac and pulmonary complications).  Patient verbalized understanding and gave verbal consent to proceed with EGD and dilation.  Continue Protonix IV.  NPO until post procedure.    LOS: 1 day   Salley Slaughter  PA-C 02/28/2021, 11:15 AM  Contact #  [phone]

## 2021-02-28 NOTE — Progress Notes (Signed)
PROGRESS NOTE                                                                             PROGRESS NOTE                                                                                                                                                                                                             Patient Demographics:    Krista Mason, is a 85 y.o. female, DOB - 1936/04/14, WIO:[id number]  Outpatient Primary MD for the patient is Leighton Ruff, MD    LOS - 1  Admit date - 02/27/2021    Chief Complaint  Patient presents with  . Shortness of Breath       Brief Narrative    HPI: Krista Mason is a 85 y.o. female with medical history significant of chronic systolic CHF, A. fib on Eliquis, GERD, CKD stage III, HTN, HLD, COPD Gold stage I (2018), presented with multiple complaints.  Patient started to have significant postnasal drip and congestion since early last week, he developed a dry cough with low-grade fever.  She used self tested for COVID 5 days ago which turned positive.  She went to see her PCP at [facility], who referred her for infusion center.  At the same time, for a week or so, she developed cramping-like abdominal pain, usually after she eats or drink.  And she noticed her stool color has turned darker.  Denies any nauseous vomiting.  T-max in last 30 days was 99 compared to baseline 87.4.  No leg swelling, her cardiologist recently switched her from Lasix to torsemide.  Patient's not vaccinated for COVID-19 ED Course: COVID test pending.  No significant hypoxia, blood pressure stable, chest x-ray no acute infiltrates.  Hb 9.3 compared to 10.9, 2 weeks ago.   Subjective:    Krista Mason today [place] any cough, shortness of breath, no fever or chills .   Assessment  & Plan :    Active Problems:   Melena   GI bleed  Acute on chronic anemia secondary to upper GI bleed -baseline hemoglobin 10-11, it is 8-9 this admission. - on IV  protonix. -She is  post endoscopy today by Dr Michail Sermon ,  significant for gastritis and ulcerated esophageal mucosa.   tolerated. -Continue to monitor H&H. -Resume Eliquis once cleared by GI  COVID infection - She  -No significant pneumonia on x-ray, no hypoxia and no fever. -Discussed with patient, she is agreeable, will receive remdesivir x3 days  Chronic systolic CHF -Euvolemic, continue home dose of torsemide  COPD Gold stage I -Stable  CKD stage III -Euvolemic.  PAF -Continue amiodarone, hold Eliquis  HTN -BP and HR stable, continue short acting BP meds.     SpO2: 93 %  Recent Labs  Lab 02/27/21 1353 02/27/21 1803 02/27/21 1849 02/28/21 0015  WBC 6.1  --   --  5.9  PLT 169  --   --  152  DDIMER 0.75*  --   --   --   AST 24  --   --   --   ALT 24  --   --   --   ALKPHOS 68  --   --   --   BILITOT 0.4  --   --   --   ALBUMIN 3.1*  --   --   --   INR  --   --  1.4*  --   SARSCOV2NAA  --  POSITIVE*  --   --        ABG     Component Value Date/Time   PHART 7.427 01/25/2017 0835   PCO2ART 33.7 01/25/2017 0835   PO2ART 67.0 (L) 01/25/2017 0835   HCO3 21.9 01/31/2018 0809   HCO3 23.2 01/31/2018 0809   TCO2 23 01/31/2018 0809   TCO2 24 01/31/2018 0809   ACIDBASEDEF 3.0 (H) 01/31/2018 0809   ACIDBASEDEF 2.0 01/31/2018 0809   O2SAT 65.0 01/31/2018 0809   O2SAT 67.0 01/31/2018 0809        Condition - Extremely Guarded  Family Communication  :  D/W daughter by phone.  Code Status :  Full  Consults  :  GI  Procedures  :  EGD   Disposition Plan  :    Status is: Inpatient  Remains inpatient appropriate because:IV treatments appropriate due to intensity of illness or inability to take PO   Dispo: The patient is from: Home              Anticipated d/c is to: Home              Patient currently is not medically stable to d/c.   Difficult to place patient No      DVT Prophylaxis  :  SCDs   Lab Results  Component Value Date    PLT 152 02/28/2021    Diet :  Diet Order            Diet clear liquid Room service appropriate? Yes; Fluid consistency: Thin  Diet effective now                  Inpatient Medications  Scheduled Meds: . atorvastatin  20 mg Oral Daily  . benzonatate  100 mg Oral TID  . carvedilol  12.5 mg Oral BID WC  . feeding supplement  237 mL Oral BID BM  . guaiFENesin  1,200 mg Oral BID  . hydrALAZINE  50 mg Oral BID  . Ipratropium-Albuterol  1 puff Inhalation Q6H  . pantoprazole (PROTONIX)  IV  40 mg Intravenous Q12H  . torsemide  20 mg Oral Daily   Continuous Infusions: . [START ON 03/01/2021] remdesivir 100 mg in NS 100 mL     PRN Meds:.acetaminophen, diphenhydrAMINE, ondansetron, polyethylene glycol  Antibiotics  :    Anti-infectives (From admission, onward)   Start     Dose/Rate Route Frequency Ordered Stop   03/01/21 1000  remdesivir 100 mg in sodium chloride 0.9 % 100 mL IVPB       "Followed by" Linked Group Details   100 mg 200 mL/hr over 30 Minutes Intravenous Daily 02/28/21 0916 03/03/21 0959   02/28/21 1015  remdesivir 200 mg in sodium chloride 0.9% 250 mL IVPB       "Followed by" Linked Group Details   200 mg 580 mL/hr over 30 Minutes Intravenous Once 02/28/21 0916 02/28/21 1232        Edie Vallandingham M.D on 02/28/2021 at 4:27 PM  To page go to [email or web address]   [facility] -  Office  [phone]       Objective:   Vitals:   02/28/21 1415 02/28/21 1419 02/28/21 1426 02/28/21 1500  BP: (!) 115/54 (!) 117/48 (!) 118/51 (!) 127/54  Pulse: 70 70 70 70  Resp:    16  Temp:    98.6 F (37 C)  TempSrc:    Oral  SpO2: 93% 94% 94% 93%  Weight:      Height:        Wt Readings from Last 3 Encounters:  02/28/21 66.8 kg  12/12/20 69.9 kg  11/29/20 67.6 kg     Intake/Output Summary (Last 24 hours) at 02/28/2021 1627 Last data filed at 02/28/2021 1410 Gross per 24 hour  Intake 400 ml  Output 100 ml  Net 300 ml     Physical Exam  Awake Alert,  No new F.N deficits, Normal affect Symmetrical Chest wall movement, Good air movement bilaterally, CTAB RRR,No Gallops,Rubs or new Murmurs, No Parasternal Heave +ve B.Sounds, Abd Soft, No tenderness, No organomegaly appriciated, No rebound - guarding or rigidity. No Cyanosis, Clubbing or edema, No new Rash or bruise      Data Review:    CBC Recent Labs  Lab 02/27/21 1353 02/27/21 2330 02/28/21 0015  WBC 6.1  --  5.9  HGB 9.3* 8.4* 8.4*  HCT 29.4* 26.1* 26.2*  PLT 169  --  152  MCV 90.7  --  89.7  MCH 28.7  --  28.8  MCHC 31.6  --  32.1  RDW 16.4*  --  16.5*  LYMPHSABS 1.2  --   --   MONOABS 0.4  --   --   EOSABS 0.0  --   --   BASOSABS 0.0  --   --     Recent Labs  Lab 02/27/21 1353 02/27/21 1849 02/28/21 0015  NA 134*  --  132*  K 3.9  --  3.4*  CL 101  --  97*  CO2 25  --  24  GLUCOSE 170*  --  126*  BUN 37*  --  35*  CREATININE 1.96*  --  1.92*  CALCIUM 9.2  --  8.7*  AST 24  --   --   ALT 24  --   --   ALKPHOS 68  --   --   BILITOT 0.4  --   --   ALBUMIN 3.1*  --   --   DDIMER 0.75*  --   --   INR  --  1.4*  --     ------------------------------------------------------------------------------------------------------------------ No results for input(s): CHOL, HDL, LDLCALC, TRIG, CHOLHDL, LDLDIRECT in the last 72 hours.  Lab Results  Component Value Date   HGBA1C 6.9 (H) 06/11/2019   ------------------------------------------------------------------------------------------------------------------ No results for input(s): TSH, T4TOTAL, T3FREE, THYROIDAB in the last 72 hours.  Invalid input(s): FREET3  Cardiac Enzymes No results for input(s): CKMB, TROPONINI, MYOGLOBIN in the last 168 hours.  Invalid input(s): CK ------------------------------------------------------------------------------------------------------------------    Component Value Date/Time   BNP 343.6 (H) 09/23/2019 1225    Micro Results Recent Results (from the past 240  hour(s))  SARS CORONAVIRUS 2 (TAT 6-24 HRS) Nasopharyngeal Nasopharyngeal Swab     Status: Abnormal   Collection Time: 02/27/21  6:03 PM   Specimen: Nasopharyngeal Swab  Result Value Ref Range Status   SARS Coronavirus 2 POSITIVE (A) NEGATIVE Final    Comment: (NOTE) SARS-CoV-2 target nucleic acids are DETECTED.  The SARS-CoV-2 RNA is generally detectable in upper and lower respiratory specimens during the acute phase of infection. Positive results are indicative of the presence of SARS-CoV-2 RNA. Clinical correlation with patient history and other diagnostic information is  necessary to determine patient infection status. Positive results do not rule out bacterial infection or co-infection with other viruses.  The expected result is Negative.  Fact Sheet for Patients: SugarRoll.be  Fact Sheet for Healthcare Providers: https://www.woods-mathews.com/  This test is not yet approved or cleared by the [place] FDA and  has been authorized for detection and/or diagnosis of SARS-CoV-2 by FDA under an Emergency Use Authorization (EUA). This EUA will remain  in effect (meaning this test can be used) for the duration of the COVID-19 declaration under Section 564(b)(1) of the Act, 21 U. S.C. section 360bbb-3(b)(1), unless the authorization is terminated or revoked sooner.   Performed at [facility], [place] [address] [place], [place] [address]     Radiology Reports CT ABDOMEN PELVIS WO CONTRAST  Result Date: 02/27/2021 CLINICAL DATA:  Nonlocalized abdominal pain EXAM: CT ABDOMEN AND PELVIS WITHOUT CONTRAST TECHNIQUE: Multidetector CT imaging of the abdomen and pelvis was performed following the standard protocol without IV contrast. COMPARISON:  CT 11/29/2020, radiograph 11/29/2020 FINDINGS: Lower chest: Lung bases demonstrate cardiomegaly with partially visualized intracardiac pacing leads. Trace pericardial effusion. No acute  consolidation or effusion. Small hiatal hernia. Right upper quadrant 16 mm subcutaneous mass. Hepatobiliary: No focal hepatic abnormality. No biliary dilatation. Punctate stones in the gallbladder. Pancreas: Unremarkable. No pancreatic ductal dilatation or surrounding inflammatory changes. Spleen: Multiple granuloma Adrenals/Urinary Tract: Adrenal glands are normal. Kidneys show no hydronephrosis. Multiple low-attenuation renal lesions, probably cysts but further characterisation limited without contrast. Urinary bladder is unremarkable. Stomach/Bowel: Stomach nonenlarged. No dilated small bowel. No bowel wall thickening. Sigmoid colon diverticular disease without acute inflammatory process. Vascular/Lymphatic: Advanced aortic atherosclerosis. Mild aneurysmal dilatation of the distal infrarenal abdominal aorta measuring up to 3.3 cm. No suspicious nodes. Reproductive: Uterus and bilateral adnexa are unremarkable. Other: Negative for free air or free fluid. Musculoskeletal: Chronic superior endplate deformity at L1. No acute osseous abnormality. IMPRESSION: 1. No CT evidence for acute intra-abdominal or pelvic abnormality. 2. Cardiomegaly with trace pericardial effusion. 3. Sigmoid colon diverticular disease without acute inflammatory process 4. Infrarenal abdominal aortic aneurysm up to 3.3 cm. Recommend follow-up ultrasound every 3 years. This recommendation follows ACR consensus guidelines: White Paper of the ACR Incidental Findings Committee II on Vascular Findings. J Am Coll Radiol 2013; 10:789-794. 5. Right upper quadrant 16 mm subcutaneous mass which may be  correlated with physical exam 6. Probable small gallstones Electronically Signed   By: Donavan Foil M.D.   On: 02/27/2021 22:13   DG Chest Portable 1 View  Result Date: 02/27/2021 CLINICAL DATA:  Short of breath.  COVID-19 positive. EXAM: PORTABLE CHEST 1 VIEW COMPARISON:  08/18/2019. FINDINGS: Cardiac silhouette mildly enlarged. Stable left anterior  chest wall biventricular cardioverter-defibrillator. Dense calcification along the thoracic aorta and in the AP window, stable. No mediastinal or hilar masses. Lungs hyperexpanded, but clear. No pleural effusion or pneumothorax. Skeletal structures are grossly intact. IMPRESSION: 1. No acute cardiopulmonary disease. Electronically Signed   By: Lajean Manes M.D.   On: 02/27/2021 14:05   CUP PACEART REMOTE DEVICE CHECK  Result Date: 01/31/2021 Scheduled remote reviewed. Normal device function.  Next remote 91 days- JBox, RN/CVRS

## 2021-02-28 NOTE — Anesthesia Procedure Notes (Signed)
Procedure Name: Intubation Date/Time: 02/28/2021 1:50 PM Performed by: Janace Litten, CRNA Pre-anesthesia Checklist: Patient identified, Emergency Drugs available, Suction available and Patient being monitored Patient Re-evaluated:Patient Re-evaluated prior to induction Oxygen Delivery Method: Circle System Utilized Preoxygenation: Pre-oxygenation with 100% oxygen Induction Type: IV induction and Rapid sequence Laryngoscope Size: Mac and 3 Grade View: Grade I Tube type: Oral Tube size: 7.0 mm Number of attempts: 1 Airway Equipment and Method: Stylet Placement Confirmation: ETT inserted through vocal cords under direct vision,  positive ETCO2 and breath sounds checked- equal and bilateral Secured at: 21 cm Tube secured with: Tape Dental Injury: Teeth and Oropharynx as per pre-operative assessment

## 2021-02-28 NOTE — Transfer of Care (Signed)
Immediate Anesthesia Transfer of Care Note  Patient: Krista Mason  Procedure(s) Performed: ESOPHAGOGASTRODUODENOSCOPY (EGD) (N/A )  Patient Location: PACU and Endoscopy Unit  Anesthesia Type:General  Level of Consciousness: awake, alert  and patient cooperative  Airway & Oxygen Therapy: Patient Spontanous Breathing  Post-op Assessment: Report given to RN and Post -op Vital signs reviewed and stable  Post vital signs: Reviewed and stable  Last Vitals:  Vitals Value Taken Time  BP    Temp    Pulse    Resp    SpO2      Last Pain:  Vitals:   02/28/21 1331  TempSrc: Oral  PainSc: 0-No pain         Complications: No complications documented.

## 2021-02-28 NOTE — Plan of Care (Signed)
Patient without distress. Monitoring according to orders and care plan. Patient encouraged to call for assistance with all needs. Patient verbalized understanding. Contact/Airborne precautions in place.

## 2021-03-01 DIAGNOSIS — K2991 Gastroduodenitis, unspecified, with bleeding: Secondary | ICD-10-CM

## 2021-03-01 LAB — CBC
HCT: 24.6 % — ABNORMAL LOW (ref 36.0–46.0)
Hemoglobin: 7.9 g/dL — ABNORMAL LOW (ref 12.0–15.0)
MCH: 28.6 pg (ref 26.0–34.0)
MCHC: 32.1 g/dL (ref 30.0–36.0)
MCV: 89.1 fL (ref 80.0–100.0)
Platelets: 166 10*3/uL (ref 150–400)
RBC: 2.76 MIL/uL — ABNORMAL LOW (ref 3.87–5.11)
RDW: 16.8 % — ABNORMAL HIGH (ref 11.5–15.5)
WBC: 6.5 10*3/uL (ref 4.0–10.5)
nRBC: 0 % (ref 0.0–0.2)

## 2021-03-01 LAB — BASIC METABOLIC PANEL
Anion gap: 9 (ref 5–15)
BUN: 33 mg/dL — ABNORMAL HIGH (ref 8–23)
CO2: 24 mmol/L (ref 22–32)
Calcium: 8.7 mg/dL — ABNORMAL LOW (ref 8.9–10.3)
Chloride: 100 mmol/L (ref 98–111)
Creatinine, Ser: 2.01 mg/dL — ABNORMAL HIGH (ref 0.44–1.00)
GFR, Estimated: 24 mL/min — ABNORMAL LOW (ref >60)
Glucose, Bld: 106 mg/dL — ABNORMAL HIGH (ref 70–99)
Potassium: 3.5 mmol/L (ref 3.5–5.1)
Sodium: 133 mmol/L — ABNORMAL LOW (ref 135–145)

## 2021-03-01 LAB — OCCULT BLOOD, POC DEVICE: Fecal Occult Bld: POSITIVE — AB

## 2021-03-01 MED ORDER — SUCRALFATE 1 GM/10ML PO SUSP
1.0000 g | Freq: Three times a day (TID) | ORAL | Status: DC
  Administered 2021-03-01 – 2021-03-14 (×51): 1 g via ORAL
  Filled 2021-03-01 (×46): qty 10

## 2021-03-01 MED ORDER — IPRATROPIUM-ALBUTEROL 20-100 MCG/ACT IN AERS
1.0000 | INHALATION_SPRAY | Freq: Four times a day (QID) | RESPIRATORY_TRACT | Status: DC | PRN
  Administered 2021-03-09: 1 via RESPIRATORY_TRACT
  Filled 2021-03-01: qty 4

## 2021-03-01 MED ORDER — SODIUM CHLORIDE 0.9 % IV SOLN
510.0000 mg | Freq: Once | INTRAVENOUS | Status: AC
  Administered 2021-03-01: 510 mg via INTRAVENOUS
  Filled 2021-03-01: qty 17

## 2021-03-01 NOTE — Progress Notes (Signed)
PROGRESS NOTE    Krista Mason  ZHY:[id number] DOB: 10-Dec-1935 DOA: 02/27/2021 PCP: Leighton Ruff, MD    Brief Narrative:  Krista Mason a 85 y.o.femalewith medical history significant ofchronic systolic CHF, A. fib on Eliquis, GERD, CKD stage IIIb, HTN, HLD, COPD Gold stage I(2018), presented with multiple complaints.  Patient started to have significant postnasal drip and congestion since early last week, she developed a dry cough with low-grade fever. She used self tested for COVID 02/22/21 which turned positive. She went to see her PCP at [facility], whoreferred her for infusion center. At the same time, for a week or so, she developed cramping-like abdominal pain, usually after she eats or drink.And she noticed her stool color has turned darker. Denies any nauseous vomiting. Patient is not vaccinated for COVID-19 ED Course:No significant hypoxia, blood pressure stable, chest x-ray no acute infiltrates. Hb9.3 compared to 10.9, 2 weeks ago.    Assessment & Plan:   Active Problems:   Melena   GI bleed  Acute on chronic anemia secondary to upper GI bleeding: EGD 5/10 Dr. Michail Sermon, patient was found to have severe distal ulcerated esophageal stricture.   Baseline hemoglobin 10-11.  Presented with hemoglobin of 8.9-8-7.9. Recheck hemoglobin tomorrow morning. Will prescribe 1 dose of IV iron before discharge. If significant drop in hemoglobin, will benefit with transfusion.  As per GI recommendation, continue to hold Eliquis, soft diet and outpatient follow-up. On Protonix IV twice daily, will change to oral on discharge.  Added Carafate.  COVID infection: No significant pneumonia on x-ray.  No hypoxia no fever.  Patient is given 3days IV remdesivir because of hospitalizations and mild symptoms with chronic morbidities.  Chronic systolic heart failure: Euvolemic.  On torsemide.  CKD stage IIIb: Fairly stable at about her baseline.  Her baseline creatinine is about 2.   She follows up with nephrology as outpatient.  Essential hypertension: Stable on medications.  Start mobilizing in the room with a walker.  DVT prophylaxis: SCDs Start: 02/27/21 1835   Code Status: DNR Family Communication: Daughter on the phone Disposition Plan: Status is: Inpatient  Remains inpatient appropriate because:IV treatments appropriate due to intensity of illness or inability to take PO and Inpatient level of care appropriate due to severity of illness   Dispo: The patient is from: Home              Anticipated d/c is to: Home              Patient currently is not medically stable to d/c.   Difficult to place patient No         Consultants:   Gastroenterology  Procedures:   Upper GI endoscopy 5/10, severe distal ulcerated esophageal stricture.  Antimicrobials:   None   Subjective: Patient seen and examined.  Just anxious and tired with everything going on.  Difficult to swallow.  Feels weak and that is really bothering her.  Patient has some dry cough and nasal congestion. She was nervous how she is going to go home.  Objective: Vitals:   03/01/21 0541 03/01/21 1056 03/01/21 1100 03/01/21 1101  BP: (!) 134/50 (!) 149/53 (!) 142/49 (!) 135/51  Pulse: 69     Resp: 18 16    Temp: 99.2 F (37.3 C) 99.3 F (37.4 C)    TempSrc: Oral Oral    SpO2: 90% 92%    Weight:      Height:        Intake/Output Summary (Last 24 hours) at  03/01/2021 1329 Last data filed at 03/01/2021 0550 Gross per 24 hour  Intake 570 ml  Output 1000 ml  Net -430 ml   Filed Weights   02/27/21 1302 02/27/21 2015 02/28/21 1331  Weight: 68 kg 66.8 kg 66.8 kg    Examination:  General exam: Appears calm and comfortable at rest.  She is anxious.  She is on room air. Respiratory system: Clear to auscultation. Respiratory effort normal.  No added sounds. Cardiovascular system: S1 & S2 heard, RRR.  Gastrointestinal system: Abdomen is nondistended, soft and nontender. No  organomegaly or masses felt. Normal bowel sounds heard. Central nervous system: Alert and oriented. No focal neurological deficits. Extremities: Symmetric 5 x 5 power. Skin: No rashes, lesions or ulcers Psychiatry: Judgement and insight appear normal. Mood & affect anxious.    Data Reviewed: I have personally reviewed following labs and imaging studies  CBC: Recent Labs  Lab 02/27/21 1353 02/27/21 2330 02/28/21 0015 03/01/21 0353  WBC 6.1  --  5.9 6.5  NEUTROABS 4.5  --   --   --   HGB 9.3* 8.4* 8.4* 7.9*  HCT 29.4* 26.1* 26.2* 24.6*  MCV 90.7  --  89.7 89.1  PLT 169  --  152 562   Basic Metabolic Panel: Recent Labs  Lab 02/27/21 1353 02/28/21 0015 03/01/21 0353  NA 134* 132* 133*  K 3.9 3.4* 3.5  CL 101 97* 100  CO2 25 24 24   GLUCOSE 170* 126* 106*  BUN 37* 35* 33*  CREATININE 1.96* 1.92* 2.01*  CALCIUM 9.2 8.7* 8.7*   GFR: Estimated Creatinine Clearance: 19.4 mL/min (A) (by C-G formula based on SCr of 2.01 mg/dL (H)). Liver Function Tests: Recent Labs  Lab 02/27/21 1353  AST 24  ALT 24  ALKPHOS 68  BILITOT 0.4  PROT 6.3*  ALBUMIN 3.1*   No results for input(s): LIPASE, AMYLASE in the last 168 hours. No results for input(s): AMMONIA in the last 168 hours. Coagulation Profile: Recent Labs  Lab 02/27/21 1849  INR 1.4*   Cardiac Enzymes: No results for input(s): CKTOTAL, CKMB, CKMBINDEX, TROPONINI in the last 168 hours. BNP (last 3 results) No results for input(s): PROBNP in the last 8760 hours. HbA1C: No results for input(s): HGBA1C in the last 72 hours. CBG: No results for input(s): GLUCAP in the last 168 hours. Lipid Profile: No results for input(s): CHOL, HDL, LDLCALC, TRIG, CHOLHDL, LDLDIRECT in the last 72 hours. Thyroid Function Tests: No results for input(s): TSH, T4TOTAL, FREET4, T3FREE, THYROIDAB in the last 72 hours. Anemia Panel: No results for input(s): VITAMINB12, FOLATE, FERRITIN, TIBC, IRON, RETICCTPCT in the last 72  hours. Sepsis Labs: No results for input(s): PROCALCITON, LATICACIDVEN in the last 168 hours.  Recent Results (from the past 240 hour(s))  SARS CORONAVIRUS 2 (TAT 6-24 HRS) Nasopharyngeal Nasopharyngeal Swab     Status: Abnormal   Collection Time: 02/27/21  6:03 PM   Specimen: Nasopharyngeal Swab  Result Value Ref Range Status   SARS Coronavirus 2 POSITIVE (A) NEGATIVE Final    Comment: (NOTE) SARS-CoV-2 target nucleic acids are DETECTED.  The SARS-CoV-2 RNA is generally detectable in upper and lower respiratory specimens during the acute phase of infection. Positive results are indicative of the presence of SARS-CoV-2 RNA. Clinical correlation with patient history and other diagnostic information is  necessary to determine patient infection status. Positive results do not rule out bacterial infection or co-infection with other viruses.  The expected result is Negative.  Fact Sheet for Patients: SugarRoll.be  Fact Sheet for Healthcare Providers: https://www.woods-mathews.com/  This test is not yet approved or cleared by the [place] FDA and  has been authorized for detection and/or diagnosis of SARS-CoV-2 by FDA under an Emergency Use Authorization (EUA). This EUA will remain  in effect (meaning this test can be used) for the duration of the COVID-19 declaration under Section 564(b)(1) of the Act, 21 U. S.C. section 360bbb-3(b)(1), unless the authorization is terminated or revoked sooner.   Performed at [facility], [place] [address] [place], [place] [address]          Radiology Studies: CT ABDOMEN PELVIS WO CONTRAST  Result Date: 02/27/2021 CLINICAL DATA:  Nonlocalized abdominal pain EXAM: CT ABDOMEN AND PELVIS WITHOUT CONTRAST TECHNIQUE: Multidetector CT imaging of the abdomen and pelvis was performed following the standard protocol without IV contrast. COMPARISON:  CT 11/29/2020, radiograph 11/29/2020 FINDINGS: Lower  chest: Lung bases demonstrate cardiomegaly with partially visualized intracardiac pacing leads. Trace pericardial effusion. No acute consolidation or effusion. Small hiatal hernia. Right upper quadrant 16 mm subcutaneous mass. Hepatobiliary: No focal hepatic abnormality. No biliary dilatation. Punctate stones in the gallbladder. Pancreas: Unremarkable. No pancreatic ductal dilatation or surrounding inflammatory changes. Spleen: Multiple granuloma Adrenals/Urinary Tract: Adrenal glands are normal. Kidneys show no hydronephrosis. Multiple low-attenuation renal lesions, probably cysts but further characterisation limited without contrast. Urinary bladder is unremarkable. Stomach/Bowel: Stomach nonenlarged. No dilated small bowel. No bowel wall thickening. Sigmoid colon diverticular disease without acute inflammatory process. Vascular/Lymphatic: Advanced aortic atherosclerosis. Mild aneurysmal dilatation of the distal infrarenal abdominal aorta measuring up to 3.3 cm. No suspicious nodes. Reproductive: Uterus and bilateral adnexa are unremarkable. Other: Negative for free air or free fluid. Musculoskeletal: Chronic superior endplate deformity at L1. No acute osseous abnormality. IMPRESSION: 1. No CT evidence for acute intra-abdominal or pelvic abnormality. 2. Cardiomegaly with trace pericardial effusion. 3. Sigmoid colon diverticular disease without acute inflammatory process 4. Infrarenal abdominal aortic aneurysm up to 3.3 cm. Recommend follow-up ultrasound every 3 years. This recommendation follows ACR consensus guidelines: White Paper of the ACR Incidental Findings Committee II on Vascular Findings. J Am Coll Radiol 2013; 10:789-794. 5. Right upper quadrant 16 mm subcutaneous mass which may be correlated with physical exam 6. Probable small gallstones Electronically Signed   By: Donavan Foil M.D.   On: 02/27/2021 22:13   DG Chest Portable 1 View  Result Date: 02/27/2021 CLINICAL DATA:  Short of breath.  COVID-19  positive. EXAM: PORTABLE CHEST 1 VIEW COMPARISON:  08/18/2019. FINDINGS: Cardiac silhouette mildly enlarged. Stable left anterior chest wall biventricular cardioverter-defibrillator. Dense calcification along the thoracic aorta and in the AP window, stable. No mediastinal or hilar masses. Lungs hyperexpanded, but clear. No pleural effusion or pneumothorax. Skeletal structures are grossly intact. IMPRESSION: 1. No acute cardiopulmonary disease. Electronically Signed   By: Lajean Manes M.D.   On: 02/27/2021 14:05        Scheduled Meds: . atorvastatin  20 mg Oral Daily  . benzonatate  100 mg Oral TID  . carvedilol  12.5 mg Oral BID WC  . feeding supplement  237 mL Oral BID BM  . guaiFENesin  1,200 mg Oral BID  . hydrALAZINE  50 mg Oral BID  . pantoprazole (PROTONIX) IV  40 mg Intravenous Q12H  . sucralfate  1 g Oral TID WC & HS  . torsemide  20 mg Oral Daily   Continuous Infusions: . remdesivir 100 mg in NS 100 mL 100 mg (03/01/21 1046)     LOS: 2  days    Time spent: 32 minutes    Barb Merino, MD [facility] Pager [phone]

## 2021-03-01 NOTE — Addendum Note (Signed)
Addendum  created 03/01/21 0914 by Janace Litten, CRNA   Intraprocedure Meds edited

## 2021-03-01 NOTE — Progress Notes (Addendum)
Subjective: Patient reports feeling unwell overall. She is unhappy that she has to recuperate in the hospital under isolation and is unable to meet her family members due to [place]. She complains of generalized fatigue, not being able to get out of bed to sit on a chair, complete lack of energy. She also is requesting for diet to be advanced.  Objective: Vital signs in last 24 hours: Temp:  [98.6 F (37 C)-99.8 F (37.7 C)] 99.3 F (37.4 C) (05/11 1056) Pulse Rate:  [68-71] 69 (05/11 0541) Resp:  [16-20] 16 (05/11 1056) BP: (108-149)/(47-104) 149/53 (05/11 1056) SpO2:  [90 %-97 %] 92 % (05/11 1056) Weight:  [66.8 kg] 66.8 kg (05/10 1331) Weight change: -1.239 kg Last BM Date:  (PTA)  PE: Elderly, frail, not short of breath, able to speak in full sentences, not using accessory muscles of respiration, on room air GENERAL: Mild pallor ABDOMEN: Nondistended, nontender EXTREMITIES: No deformity  Lab Results: Results for orders placed or performed during the hospital encounter of 02/27/21 (from the past 48 hour(s))  CBC with Differential     Status: Abnormal   Collection Time: 02/27/21  1:53 PM  Result Value Ref Range   WBC 6.1 4.0 - 10.5 K/uL   RBC 3.24 (L) 3.87 - 5.11 MIL/uL   Hemoglobin 9.3 (L) 12.0 - 15.0 g/dL   HCT 29.4 (L) 36.0 - 46.0 %   MCV 90.7 80.0 - 100.0 fL   MCH 28.7 26.0 - 34.0 pg   MCHC 31.6 30.0 - 36.0 g/dL   RDW 16.4 (H) 11.5 - 15.5 %   Platelets 169 150 - 400 K/uL   nRBC 0.0 0.0 - 0.2 %   Neutrophils Relative % 73 %   Neutro Abs 4.5 1.7 - 7.7 K/uL   Lymphocytes Relative 19 %   Lymphs Abs 1.2 0.7 - 4.0 K/uL   Monocytes Relative 7 %   Monocytes Absolute 0.4 0.1 - 1.0 K/uL   Eosinophils Relative 0 %   Eosinophils Absolute 0.0 0.0 - 0.5 K/uL   Basophils Relative 0 %   Basophils Absolute 0.0 0.0 - 0.1 K/uL   Immature Granulocytes 1 %   Abs Immature Granulocytes 0.04 0.00 - 0.07 K/uL    Comment: Performed at [facility], [address],  [place], [place] [address]  Comprehensive metabolic panel     Status: Abnormal   Collection Time: 02/27/21  1:53 PM  Result Value Ref Range   Sodium 134 (L) 135 - 145 mmol/L   Potassium 3.9 3.5 - 5.1 mmol/L   Chloride 101 98 - 111 mmol/L   CO2 25 22 - 32 mmol/L   Glucose, Bld 170 (H) 70 - 99 mg/dL    Comment: Glucose reference range applies only to samples taken after fasting for at least 8 hours.   BUN 37 (H) 8 - 23 mg/dL   Creatinine, Ser 1.96 (H) 0.44 - 1.00 mg/dL   Calcium 9.2 8.9 - 10.3 mg/dL   Total Protein 6.3 (L) 6.5 - 8.1 g/dL   Albumin 3.1 (L) 3.5 - 5.0 g/dL   AST 24 15 - 41 U/L   ALT 24 0 - 44 U/L   Alkaline Phosphatase 68 38 - 126 U/L   Total Bilirubin 0.4 0.3 - 1.2 mg/dL   GFR, Estimated 25 (L) >60 mL/min    Comment: (NOTE) Calculated using the CKD-EPI Creatinine Equation (2021)    Anion gap 8 5 - 15    Comment: Performed at [facility], [address] [place], [place] [address]  D-dimer, quantitative     Status: Abnormal   Collection Time: 02/27/21  1:53 PM  Result Value Ref Range   D-Dimer, Quant 0.75 (H) 0.00 - 0.50 ug/mL-FEU    Comment: (NOTE) At the manufacturer cut-off value of 0.5 g/mL FEU, this assay has a negative predictive value of 95-100%.This assay is intended for use in conjunction with a clinical pretest probability (PTP) assessment model to exclude pulmonary embolism (PE) and deep venous thrombosis (DVT) in outpatients suspected of PE or DVT. Results should be correlated with clinical presentation. Performed at [facility], [place] [address], [place], [place] [address]   SARS CORONAVIRUS 2 (TAT 6-24 HRS) Nasopharyngeal Nasopharyngeal Swab     Status: Abnormal   Collection Time: 02/27/21  6:03 PM   Specimen: Nasopharyngeal Swab  Result Value Ref Range   SARS Coronavirus 2 POSITIVE (A) NEGATIVE    Comment: (NOTE) SARS-CoV-2 target nucleic acids are DETECTED.  The SARS-CoV-2 RNA is generally detectable in upper and lower respiratory  specimens during the acute phase of infection. Positive results are indicative of the presence of SARS-CoV-2 RNA. Clinical correlation with patient history and other diagnostic information is  necessary to determine patient infection status. Positive results do not rule out bacterial infection or co-infection with other viruses.  The expected result is Negative.  Fact Sheet for Patients: SugarRoll.be  Fact Sheet for Healthcare Providers: https://www.woods-mathews.com/  This test is not yet approved or cleared by the [place] FDA and  has been authorized for detection and/or diagnosis of SARS-CoV-2 by FDA under an Emergency Use Authorization (EUA). This EUA will remain  in effect (meaning this test can be used) for the duration of the COVID-19 declaration under Section 564(b)(1) of the Act, 21 U. S.C. section 360bbb-3(b)(1), unless the authorization is terminated or revoked sooner.   Performed at [facility], [place] [address] [place], [place] [address]   Type and screen [place]     Status: None   Collection Time: 02/27/21  6:49 PM  Result Value Ref Range   ABO/RH(D) A POS    Antibody Screen NEG    Sample Expiration      03/02/2021,2359 Performed at [facility], [place] [address], [place], [place] [address]   Protime-INR     Status: Abnormal   Collection Time: 02/27/21  6:49 PM  Result Value Ref Range   Prothrombin Time 17.0 (H) 11.4 - 15.2 seconds   INR 1.4 (H) 0.8 - 1.2    Comment: (NOTE) INR goal varies based on device and disease states. Performed at [facility], [place] [address], [place], [place] [address]   APTT     Status: None   Collection Time: 02/27/21  6:49 PM  Result Value Ref Range   aPTT 35 24 - 36 seconds    Comment: Performed at [place] [address] [place], [place] [address]  Hemoglobin and hematocrit, blood     Status: Abnormal   Collection Time: 02/27/21  11:30 PM  Result Value Ref Range   Hemoglobin 8.4 (L) 12.0 - 15.0 g/dL   HCT 26.1 (L) 36.0 - 46.0 %    Comment: Performed at [facility], [place] [address] [place], [place] [address]  Basic metabolic panel     Status: Abnormal   Collection Time: 02/28/21 12:15 AM  Result Value Ref Range   Sodium 132 (L) 135 -  145 mmol/L   Potassium 3.4 (L) 3.5 - 5.1 mmol/L   Chloride 97 (L) 98 - 111 mmol/L   CO2 24 22 - 32 mmol/L   Glucose, Bld 126 (H) 70 - 99 mg/dL    Comment: Glucose reference range applies only to samples taken after fasting for at least 8 hours.   BUN 35 (H) 8 - 23 mg/dL   Creatinine, Ser 1.92 (H) 0.44 - 1.00 mg/dL   Calcium 8.7 (L) 8.9 - 10.3 mg/dL   GFR, Estimated 26 (L) >60 mL/min    Comment: (NOTE) Calculated using the CKD-EPI Creatinine Equation (2021)    Anion gap 11 5 - 15    Comment: Performed at [place] [address], [place], [place] [address]  CBC     Status: Abnormal   Collection Time: 02/28/21 12:15 AM  Result Value Ref Range   WBC 5.9 4.0 - 10.5 K/uL   RBC 2.92 (L) 3.87 - 5.11 MIL/uL   Hemoglobin 8.4 (L) 12.0 - 15.0 g/dL   HCT 26.2 (L) 36.0 - 46.0 %   MCV 89.7 80.0 - 100.0 fL   MCH 28.8 26.0 - 34.0 pg   MCHC 32.1 30.0 - 36.0 g/dL   RDW 16.5 (H) 11.5 - 15.5 %   Platelets 152 150 - 400 K/uL   nRBC 0.0 0.0 - 0.2 %    Comment: Performed at [facility], [place] [address], [place], [place] [address]  CBC     Status: Abnormal   Collection Time: 03/01/21  3:53 AM  Result Value Ref Range   WBC 6.5 4.0 - 10.5 K/uL   RBC 2.76 (L) 3.87 - 5.11 MIL/uL   Hemoglobin 7.9 (L) 12.0 - 15.0 g/dL   HCT 24.6 (L) 36.0 - 46.0 %   MCV 89.1 80.0 - 100.0 fL   MCH 28.6 26.0 - 34.0 pg   MCHC 32.1 30.0 - 36.0 g/dL   RDW 16.8 (H) 11.5 - 15.5 %   Platelets 166 150 - 400 K/uL   nRBC 0.0 0.0 - 0.2 %    Comment: Performed at [facility], [place] [address] [place], [place] [address]  Basic metabolic panel     Status: Abnormal   Collection Time: 03/01/21   3:53 AM  Result Value Ref Range   Sodium 133 (L) 135 - 145 mmol/L   Potassium 3.5 3.5 - 5.1 mmol/L   Chloride 100 98 - 111 mmol/L   CO2 24 22 - 32 mmol/L   Glucose, Bld 106 (H) 70 - 99 mg/dL    Comment: Glucose reference range applies only to samples taken after fasting for at least 8 hours.   BUN 33 (H) 8 - 23 mg/dL   Creatinine, Ser 2.01 (H) 0.44 - 1.00 mg/dL   Calcium 8.7 (L) 8.9 - 10.3 mg/dL   GFR, Estimated 24 (L) >60 mL/min    Comment: (NOTE) Calculated using the CKD-EPI Creatinine Equation (2021)    Anion gap 9 5 - 15    Comment: Performed at [place] [address] [place],  [address]    Studies/Results: CT ABDOMEN PELVIS WO CONTRAST  Result Date: 02/27/2021 CLINICAL DATA:  Nonlocalized abdominal pain EXAM: CT ABDOMEN AND PELVIS WITHOUT CONTRAST TECHNIQUE: Multidetector CT imaging of the abdomen and pelvis was performed following the standard protocol without IV contrast. COMPARISON:  CT 11/29/2020, radiograph 11/29/2020 FINDINGS: Lower chest: Lung bases demonstrate cardiomegaly with partially visualized intracardiac pacing leads. Trace pericardial effusion.  No acute consolidation or effusion. Small hiatal hernia. Right upper quadrant 16 mm subcutaneous mass. Hepatobiliary: No focal hepatic abnormality. No biliary dilatation. Punctate stones in the gallbladder. Pancreas: Unremarkable. No pancreatic ductal dilatation or surrounding inflammatory changes. Spleen: Multiple granuloma Adrenals/Urinary Tract: Adrenal glands are normal. Kidneys show no hydronephrosis. Multiple low-attenuation renal lesions, probably cysts but further characterisation limited without contrast. Urinary bladder is unremarkable. Stomach/Bowel: Stomach nonenlarged. No dilated small bowel. No bowel wall thickening. Sigmoid colon diverticular disease without acute inflammatory process. Vascular/Lymphatic: Advanced aortic atherosclerosis. Mild aneurysmal dilatation of the distal infrarenal abdominal  aorta measuring up to 3.3 cm. No suspicious nodes. Reproductive: Uterus and bilateral adnexa are unremarkable. Other: Negative for free air or free fluid. Musculoskeletal: Chronic superior endplate deformity at L1. No acute osseous abnormality. IMPRESSION: 1. No CT evidence for acute intra-abdominal or pelvic abnormality. 2. Cardiomegaly with trace pericardial effusion. 3. Sigmoid colon diverticular disease without acute inflammatory process 4. Infrarenal abdominal aortic aneurysm up to 3.3 cm. Recommend follow-up ultrasound every 3 years. This recommendation follows ACR consensus guidelines: White Paper of the ACR Incidental Findings Committee II on Vascular Findings. J Am Coll Radiol 2013; 10:789-794. 5. Right upper quadrant 16 mm subcutaneous mass which may be correlated with physical exam 6. Probable small gallstones Electronically Signed   By: Donavan Foil M.D.   On: 02/27/2021 22:13   DG Chest Portable 1 View  Result Date: 02/27/2021 CLINICAL DATA:  Short of breath.  COVID-19 positive. EXAM: PORTABLE CHEST 1 VIEW COMPARISON:  08/18/2019. FINDINGS: Cardiac silhouette mildly enlarged. Stable left anterior chest wall biventricular cardioverter-defibrillator. Dense calcification along the thoracic aorta and in the AP window, stable. No mediastinal or hilar masses. Lungs hyperexpanded, but clear. No pleural effusion or pneumothorax. Skeletal structures are grossly intact. IMPRESSION: 1. No acute cardiopulmonary disease. Electronically Signed   By: Lajean Manes M.D.   On: 02/27/2021 14:05    Medications: I have reviewed the patient's current medications.  Assessment: Severe esophagitis in distal 7 cm of esophagus, esophageal ulcer and esophageal stenosis noted on EGD yesterday Hemoglobin 7.9 today BUN essentially same at 33 with an elevated creatinine of 2.01, without evidence of melena or hematochezia  COVID-positive, on treatment  Plan: Will start mechanical soft diet, advised to avoid meat,  continue pantoprazole IV every 12 hours while she is in the hospital, thereafter changed to oral twice daily at least for the next 6 weeks. I have started her on sucralfate 1 g / 10 mL suspension 4 times a day, recommend continuing the same for the next 6 weeks. Recommend holding Eliquis for at least for a week, recommend resuming thereafter if hemoglobin is stable as an outpatient. If patient has continued symptoms of dysphagia, recommend EGD as an outpatient in 6 to 8 weeks, at that point if esophageal ulcers and esophagitis have improved, she will benefit from balloon dilatation. Discussed the same with the patient and her hospitalist- Dr.Kuber Ghimire.   Will sign off, please reach out to [place] if needed during this hospitalization.   [name], MD 03/01/2021, 11:20 AM

## 2021-03-01 NOTE — Addendum Note (Signed)
Addendum  created 03/01/21 0913 by Janace Litten, CRNA   Intraprocedure Meds edited

## 2021-03-01 NOTE — Plan of Care (Signed)

## 2021-03-02 DIAGNOSIS — K2991 Gastroduodenitis, unspecified, with bleeding: Secondary | ICD-10-CM | POA: Diagnosis not present

## 2021-03-02 LAB — CBC WITH DIFFERENTIAL/PLATELET
Abs Immature Granulocytes: 0.07 10*3/uL (ref 0.00–0.07)
Basophils Absolute: 0 10*3/uL (ref 0.0–0.1)
Basophils Relative: 0 %
Eosinophils Absolute: 0 10*3/uL (ref 0.0–0.5)
Eosinophils Relative: 0 %
HCT: 24.5 % — ABNORMAL LOW (ref 36.0–46.0)
Hemoglobin: 7.9 g/dL — ABNORMAL LOW (ref 12.0–15.0)
Immature Granulocytes: 1 %
Lymphocytes Relative: 19 %
Lymphs Abs: 1.2 10*3/uL (ref 0.7–4.0)
MCH: 28.5 pg (ref 26.0–34.0)
MCHC: 32.2 g/dL (ref 30.0–36.0)
MCV: 88.4 fL (ref 80.0–100.0)
Monocytes Absolute: 0.6 10*3/uL (ref 0.1–1.0)
Monocytes Relative: 9 %
Neutro Abs: 4.5 10*3/uL (ref 1.7–7.7)
Neutrophils Relative %: 71 %
Platelets: 171 10*3/uL (ref 150–400)
RBC: 2.77 MIL/uL — ABNORMAL LOW (ref 3.87–5.11)
RDW: 16.8 % — ABNORMAL HIGH (ref 11.5–15.5)
WBC: 6.3 10*3/uL (ref 4.0–10.5)
nRBC: 0 % (ref 0.0–0.2)

## 2021-03-02 MED ORDER — AMIODARONE HCL 200 MG PO TABS
100.0000 mg | ORAL_TABLET | ORAL | Status: DC
  Administered 2021-03-02 – 2021-03-14 (×7): 100 mg via ORAL
  Filled 2021-03-02 (×12): qty 1

## 2021-03-02 NOTE — TOC Initial Note (Signed)
Transition of Care [facility]) - Initial/Assessment Note    Patient Details  Name: Krista Mason MRN: [id number] Date of Birth: [birth year]  Transition of Care [facility]) CM/SW Contact:    Bethann Berkshire, [place] Phone Number: 03/02/2021, 2:39 PM  Clinical Narrative:                  CSW called pt on room phone to discuss SNF recommendation. Pt currently lives in [facility]. She has a son and daughter who both live within 30 minutes. Pt states she has a rolling walker and rollator at home. Pt would like to be able to return home if possible but at this time states she understands she needs to go to SNF for rehab. Pt is agreeable to SNF workup. CSW explains limitations of pt's insurance and bed availability due to pt being covid positive. Pt is not vaccinated. She is asymptomatic. Covid positive at home on 02/22/21. Pt will be off of isolation protocols beginning 5/14. Pt consents to CSW contacted daughter if needed. FL2 completed and bed requests sent in hub. Potential barriers include insurance(limited SNF facilities and [place] auth process) and covid positive.   Expected Discharge Plan: Skilled Nursing Facility Barriers to Discharge: Continued Medical Work up,SNF Covid   Patient Goals and CMS Choice Patient states their goals for this hospitalization and ongoing recovery are:: Would like to return home if possible but states she thinks she will need SNF   Choice offered to / list presented to : Patient  Expected Discharge Plan and Services Expected Discharge Plan: [place]       Living arrangements for the past 2 months: [place]                                      Prior Living Arrangements/Services Living arrangements for the past 2 months: [place] Lives with:: Self,Facility Resident Patient language and need for interpreter reviewed:: Yes        Need for Family Participation in Patient Care: No  (Comment) Care giver support system in place?: Yes (comment)   Criminal Activity/Legal Involvement Pertinent to Current Situation/Hospitalization: No - Comment as needed  Activities of Daily Living Home Assistive Devices/Equipment: None ADL Screening (condition at time of admission) Patient's cognitive ability adequate to safely complete daily activities?: Yes Is the patient deaf or have difficulty hearing?: No Does the patient have difficulty seeing, even when wearing glasses/contacts?: No Does the patient have difficulty concentrating, remembering, or making decisions?: No Patient able to express need for assistance with ADLs?: Yes Does the patient have difficulty dressing or bathing?: No Independently performs ADLs?: Yes (appropriate for developmental age) Does the patient have difficulty walking or climbing stairs?: No Weakness of Legs: None Weakness of Arms/Hands: None  Permission Sought/Granted   Permission granted to share information with : Yes, Verbal Permission Granted  Share Information with NAME: Hans Eden (Daughter)   [phone] (Mobile)           Emotional Assessment       Orientation: : Oriented to Self,Oriented to Place,Oriented to  Time,Oriented to Situation Alcohol / Substance Use: Not Applicable Psych Involvement: No (comment)  Admission diagnosis:  Melena [K92.1] GI bleed [K92.2] COVID-19 [U07.1] Patient Active Problem List   Diagnosis Date Noted  . Paroxysmal atrial fibrillation ([place]) 09/10/2019  . Secondary hypercoagulable state ([place]) 09/10/2019  . Melena 08/18/2019  .  GI bleed 08/18/2019  . Nonischemic cardiomyopathy ([place]) 07/24/2019  . Acute hypoxemic respiratory failure ([place])   . COPD  GOLD 0  01/17/2017  . Chronic systolic CHF (congestive heart failure) ([place]) 11/13/2016  . PVC (premature ventricular contraction) 11/13/2016  . Mixed hyperlipidemia 11/05/2016  . CKD (chronic kidney disease), stage III ([place]) 11/05/2016  . Tobacco abuse  11/05/2016  . Multifocal PVCs 11/05/2016  . Moderate mitral regurgitation 11/05/2016  . Microcytic anemia 11/05/2016  . Diabetes mellitus with complication ([place])   . Uncontrolled hypertension 06/18/2016  . Multifocal atrial tachycardia ([place]) 11/26/2014  . PVD (peripheral vascular disease) ([place]) 12/15/2012   PCP:  Leighton Ruff, MD Pharmacy:   [id number] - [place], [place] [address] Phone: [phone] Fax: [id number] - [place], [place] - [address] AT [place] [address] [place] [address] Phone: [phone] Fax: [phone]  [facility] - [place], [place] - [address] [place] [address] Phone: [phone] Fax: [phone]     Social Determinants of Health (SDOH) Interventions    Readmission Risk Interventions No flowsheet data found.

## 2021-03-02 NOTE — Plan of Care (Signed)

## 2021-03-02 NOTE — Progress Notes (Signed)
PROGRESS NOTE    EMILE RINGGENBERG  RXV:[id number] DOB: 1936/08/08 DOA: 02/27/2021 PCP: Leighton Ruff, MD    Brief Narrative:  Krista Mason a 85 y.o.femalewith medical history significant ofchronic systolic CHF, A. fib on Eliquis, GERD, CKD stage IIIb, HTN, HLD, COPD Gold stage I(2018), presented with multiple complaints.  Patient started to have significant postnasal drip and congestion since early last week, she developed a dry cough with low-grade fever. She used self tested for COVID 02/22/21 which turned positive. She went to see her PCP at [facility], whoreferred her for infusion center. At the same time, for a week or so, she developed cramping-like abdominal pain, usually after she eats or drink.And she noticed her stool color has turned darker. Denies any nauseous vomiting. Patient is not vaccinated for COVID-19.  ED Course:No significant hypoxia, blood pressure stable, chest x-ray no acute infiltrates. Hb9.3 compared to 10.9, 2 weeks ago.    Assessment & Plan:   Active Problems:   Melena   GI bleed  Acute on chronic anemia secondary to upper GI bleeding: EGD 5/10 Dr. Michail Sermon, patient was found to have severe distal ulcerated esophageal stricture s/p dilatation.   Baseline hemoglobin 10-11.  Presented with hemoglobin of 8.9-8-7.9-7.9 Received 1 dose of Feraheme 5/11.  Hemoglobin is stable since then. As per GI recommendation, continue to hold Eliquis, soft diet and outpatient follow-up. On Protonix IV twice daily, will change to oral on discharge.  Added Carafate.  COVID infection: No significant pneumonia on x-ray.  No hypoxia no fever.  Patient is given 3days IV remdesivir because of hospitalizations and mild symptoms with chronic morbidities. Was diagnosed at home on 5/4.  Documented positive test on 5/9.  1 week of isolation until 5/15.  Chronic systolic heart failure: Euvolemic.  On torsemide.  CKD stage IIIb: Fairly stable at about her baseline.  Her  baseline creatinine is about 2.  She follows up with nephrology as outpatient.  Essential hypertension: Stable on medications.  Paroxysmal A. fib: Rate controlled.  Eliquis on hold.  Tolerating Coreg and low-dose amiodarone.  Start mobilizing.  Work with PT OT.  Patient will need more rehab before returning home independent.  Will explore possibility to send her to skilled nursing facilities.  DVT prophylaxis: SCDs Start: 02/27/21 1835   Code Status: DNR Family Communication: Daughter on the phone 5/11. Disposition Plan: Status is: Inpatient  Remains inpatient appropriate because:IV treatments appropriate due to intensity of illness or inability to take PO and Inpatient level of care appropriate due to severity of illness   Dispo: The patient is from: Home              Anticipated d/c is to: Home with home health vs SNF              Patient currently is not medically stable to d/c.   Difficult to place patient No  Consultants:   Gastroenterology  Procedures:   Upper GI endoscopy 5/10, severe distal ulcerated esophageal stricture.  Antimicrobials:   None   Subjective: Patient seen and examined.  Trying to work with occupational therapist.  Patient is extremely anxious, feels weak.  She had some positive orthostatic.  Denies any chest pain or shortness of breath congestion is improved. Nervous to go home.   Objective: Vitals:   03/02/21 0458 03/02/21 0838 03/02/21 0844 03/02/21 1013  BP: 116/86 (!) 148/51 (!) 91/54 (!) 127/40  Pulse: 69 70 (!) 128 69  Resp: 17 18    Temp: 98.8  F (37.1 C) 99.4 F (37.4 C)  97.8 F (36.6 C)  TempSrc: Oral Oral    SpO2: 94% 95% 95% 92%  Weight:      Height:        Intake/Output Summary (Last 24 hours) at 03/02/2021 1117 Last data filed at 03/02/2021 1040 Gross per 24 hour  Intake 453.65 ml  Output 700 ml  Net -246.35 ml   Filed Weights   02/27/21 1302 02/27/21 2015 02/28/21 1331  Weight: 68 kg 66.8 kg 66.8 kg     Examination:  General exam: Appears calm and comfortable at rest.  She is anxious.  She is on room air. Respiratory system: Clear to auscultation. Respiratory effort normal.  No added sounds. Cardiovascular system: S1 & S2 heard, RRR.  Gastrointestinal system: Abdomen is nondistended, soft and nontender. No organomegaly or masses felt. Normal bowel sounds heard. Central nervous system: Alert and oriented. No focal neurological deficits. Extremities: Symmetric 5 x 5 power. Skin: No rashes, lesions or ulcers Psychiatry: Judgement and insight appear normal. Mood & affect anxious.    Data Reviewed: I have personally reviewed following labs and imaging studies  CBC: Recent Labs  Lab 02/27/21 1353 02/27/21 2330 02/28/21 0015 03/01/21 0353 03/02/21 0808  WBC 6.1  --  5.9 6.5 6.3  NEUTROABS 4.5  --   --   --  4.5  HGB 9.3* 8.4* 8.4* 7.9* 7.9*  HCT 29.4* 26.1* 26.2* 24.6* 24.5*  MCV 90.7  --  89.7 89.1 88.4  PLT 169  --  152 166 024   Basic Metabolic Panel: Recent Labs  Lab 02/27/21 1353 02/28/21 0015 03/01/21 0353  NA 134* 132* 133*  K 3.9 3.4* 3.5  CL 101 97* 100  CO2 25 24 24   GLUCOSE 170* 126* 106*  BUN 37* 35* 33*  CREATININE 1.96* 1.92* 2.01*  CALCIUM 9.2 8.7* 8.7*   GFR: Estimated Creatinine Clearance: 19.4 mL/min (A) (by C-G formula based on SCr of 2.01 mg/dL (H)). Liver Function Tests: Recent Labs  Lab 02/27/21 1353  AST 24  ALT 24  ALKPHOS 68  BILITOT 0.4  PROT 6.3*  ALBUMIN 3.1*   No results for input(s): LIPASE, AMYLASE in the last 168 hours. No results for input(s): AMMONIA in the last 168 hours. Coagulation Profile: Recent Labs  Lab 02/27/21 1849  INR 1.4*   Cardiac Enzymes: No results for input(s): CKTOTAL, CKMB, CKMBINDEX, TROPONINI in the last 168 hours. BNP (last 3 results) No results for input(s): PROBNP in the last 8760 hours. HbA1C: No results for input(s): HGBA1C in the last 72 hours. CBG: No results for input(s): GLUCAP in  the last 168 hours. Lipid Profile: No results for input(s): CHOL, HDL, LDLCALC, TRIG, CHOLHDL, LDLDIRECT in the last 72 hours. Thyroid Function Tests: No results for input(s): TSH, T4TOTAL, FREET4, T3FREE, THYROIDAB in the last 72 hours. Anemia Panel: No results for input(s): VITAMINB12, FOLATE, FERRITIN, TIBC, IRON, RETICCTPCT in the last 72 hours. Sepsis Labs: No results for input(s): PROCALCITON, LATICACIDVEN in the last 168 hours.  Recent Results (from the past 240 hour(s))  SARS CORONAVIRUS 2 (TAT 6-24 HRS) Nasopharyngeal Nasopharyngeal Swab     Status: Abnormal   Collection Time: 02/27/21  6:03 PM   Specimen: Nasopharyngeal Swab  Result Value Ref Range Status   SARS Coronavirus 2 POSITIVE (A) NEGATIVE Final    Comment: (NOTE) SARS-CoV-2 target nucleic acids are DETECTED.  The SARS-CoV-2 RNA is generally detectable in upper and lower respiratory specimens during the acute phase of  infection. Positive results are indicative of the presence of SARS-CoV-2 RNA. Clinical correlation with patient history and other diagnostic information is  necessary to determine patient infection status. Positive results do not rule out bacterial infection or co-infection with other viruses.  The expected result is Negative.  Fact Sheet for Patients: SugarRoll.be  Fact Sheet for Healthcare Providers: https://www.woods-mathews.com/  This test is not yet approved or cleared by the [place] FDA and  has been authorized for detection and/or diagnosis of SARS-CoV-2 by FDA under an Emergency Use Authorization (EUA). This EUA will remain  in effect (meaning this test can be used) for the duration of the COVID-19 declaration under Section 564(b)(1) of the Act, 21 U. S.C. section 360bbb-3(b)(1), unless the authorization is terminated or revoked sooner.   Performed at [facility], [place] [address], [place], [place] [address]          Radiology  Studies: No results found.      Scheduled Meds: . amiodarone  100 mg Oral Q48H  . atorvastatin  20 mg Oral Daily  . benzonatate  100 mg Oral TID  . carvedilol  12.5 mg Oral BID WC  . feeding supplement  237 mL Oral BID BM  . guaiFENesin  1,200 mg Oral BID  . hydrALAZINE  50 mg Oral BID  . pantoprazole (PROTONIX) IV  40 mg Intravenous Q12H  . sucralfate  1 g Oral TID WC & HS  . torsemide  20 mg Oral Daily   Continuous Infusions:    LOS: 3 days    Time spent: 30 minutes    Barb Merino, MD [facility] Pager [phone]

## 2021-03-02 NOTE — Evaluation (Signed)
Occupational Therapy Evaluation Patient Details Name: Krista Mason MRN: [id number] DOB: 02/11/1936 Today's Date: 03/02/2021    History of Present Illness Patient admitted with Acute on chronic anemia secondary to upper GI bleed and found to be COVID positive. Krista Mason is a 85 y.o. female with medical history significant of chronic systolic CHF, A. fib on Eliquis, GERD, CKD stage IIIb, HTN, HLD, COPD Gold stage I (2018). EGD 5/10 found erosive esophagiti.   Clinical Impression   Krista Mason is an 85 year old woman who presents with generalized weakness, decreased activity tolerance and impaired balance resulting in a decline in  Functional abilities. Typically patient independent with rolator at independent living facility. Today patient requiring seated position for ADLs and min guard for standing during ADLs and transfers. Activity limited to transfer to recliner due to patient complaining of light headedness. Patient's BP went from 150/74 in supine to 127/67 in sitting and 112/49 in standing. Overall patient feels weak and tired. At this time therapist recommends short term rehab at discharge as patient is not safely independent. Patient's hope is that she will improve in hospital in order to go home with [facility]. Patient will benefit from skilled OT services while in hospital to improve deficits and learn compensatory strategies as needed in order to return to PLOF.          Follow Up Recommendations  SNF    Equipment Recommendations  None recommended by OT    Recommendations for Other Services       Precautions / Restrictions Precautions Precautions: Fall Restrictions Weight Bearing Restrictions: No      Mobility Bed Mobility Overal bed mobility: Needs Assistance Bed Mobility: Supine to Sit     Supine to sit: Min guard;HOB elevated     General bed mobility comments: increased time to transfer to side of bed    Transfers Overall transfer level: Needs  assistance Equipment used: Rolling walker (2 wheeled) Transfers: Sit to/from [facility] Sit to Stand: Min guard Stand pivot transfers: Min guard       General transfer comment: Min guard to stand at side of bed. Patient symptomatic reporting mild light headedness. See flow  sheet for orthostatic BPs    Balance Overall balance assessment: Mild deficits observed, not formally tested                                         ADL either performed or assessed with clinical judgement   ADL Overall ADL's : Needs assistance/impaired Eating/Feeding: Independent   Grooming: Set up;Sitting   Upper Body Bathing: Set up;Sitting   Lower Body Bathing: Min guard;Sit to/from stand   Upper Body Dressing : Set up;Sitting   Lower Body Dressing: Min guard;Sit to/from stand   Toilet Transfer: Min guard;BSC;Stand-pivot;RW   Toileting- Water quality scientist and Hygiene: Min guard;Sit to/from stand       Functional mobility during ADLs: Min guard;Rolling walker       Vision Patient Visual Report: No change from baseline       Perception     Praxis      Pertinent Vitals/Pain Pain Assessment: No/denies pain     Hand Dominance Left   Extremity/Trunk Assessment Upper Extremity Assessment Upper Extremity Assessment: Generalized weakness   Lower Extremity Assessment Lower Extremity Assessment: Defer to PT evaluation   Cervical / Trunk Assessment Cervical / Trunk Assessment: Normal  Communication Communication Communication: No difficulties   Cognition Arousal/Alertness: Awake/alert Behavior During Therapy: WFL for tasks assessed/performed Overall Cognitive Status: Within Functional Limits for tasks assessed                                     General Comments       Exercises     Shoulder Instructions      Home Living Family/patient expects to be discharged to:: Private residence ([place]) Living Arrangements: Alone Available Help at Discharge: Friend(s);Available PRN/intermittently Type of Home: Independent living facility Home Access: Level entry     Home Layout: One level     Bathroom Shower/Tub: Teacher, early years/pre: Handicapped height     Home Equipment: [place] - single point;Bedside commode;Grab bars - tub/shower;Grab bars - toilet;Walker - 4 wheels;Walker - 2 wheels          Prior Functioning/Environment Level of Independence: Independent with assistive device(s)        Comments: uses rolator. Ambulates to dining room and walks around after meals.        OT Problem List: Decreased strength;Decreased activity tolerance;Impaired balance (sitting and/or standing)      OT Treatment/Interventions: Self-care/ADL training;Therapeutic exercise;DME and/or AE instruction;Therapeutic activities;Balance training;Patient/family education    OT Goals(Current goals can be found in the care plan section) Acute Rehab OT Goals Patient Stated Goal: TO get strong enough to go home OT Goal Formulation: With patient Time For Goal Achievement: 03/16/21 Potential to Achieve Goals: Good  OT Frequency: Min 2X/week   Barriers to D/C:            Co-evaluation              AM-PAC OT "6 Clicks" Daily Activity     Outcome Measure Help from another person eating meals?: None Help from another person taking care of personal grooming?: A Little Help from another person toileting, which includes using toliet, bedpan, or urinal?: A Little Help from another person bathing (including washing, rinsing, drying)?: A Little Help from another person to put on and taking off regular upper body clothing?: A Little Help from another person to put on and taking off regular lower body clothing?: A Little 6 Click Score: 19   End of Session Equipment Utilized During Treatment: Rolling walker Nurse Communication: Mobility status  Activity Tolerance: Patient  limited by fatigue Patient left: in chair;with call bell/phone within reach  OT Visit Diagnosis: Muscle weakness (generalized) (M62.81)                Time: 0370-4888 OT Time Calculation (min): 34 min Charges:  OT General Charges $OT Visit: 1 Visit OT Evaluation $OT Eval Moderate Complexity: 1 Mod OT Treatments $Self Care/Home Management : 8-22 mins  Krista Mason, OTR/L [place] [phone] Pager: [place] 03/02/2021, 9:45 AM

## 2021-03-02 NOTE — NC FL2 (Signed)
MEDICAID FL2 LEVEL OF CARE SCREENING TOOL     IDENTIFICATION  Patient Name: Krista Mason Birthdate: 1936/08/30 Sex: female Admission Date (Current Location): 02/27/2021  [facility] [place] Number:  Herbalist and Address:  [facility], [place] [address], [place], [place] [address]      Provider Number: [id number]  Attending Physician Name and Address:  Barb Merino, MD  Relative Name and Phone Number:  Hans Eden (Daughter)   [phone] [facility])    Current Level of Care: Hospital Recommended Level of Care: [place] Prior Approval Number:    Date Approved/Denied:   PASRR Number: [id number]  Discharge Plan: SNF    Current Diagnoses: Patient Active Problem List   Diagnosis Date Noted  . Paroxysmal atrial fibrillation ([place]) 09/10/2019  . Secondary hypercoagulable state ([place]) 09/10/2019  . Melena 08/18/2019  . GI bleed 08/18/2019  . Nonischemic cardiomyopathy ([place]) 07/24/2019  . Acute hypoxemic respiratory failure ([place])   . COPD  GOLD 0  01/17/2017  . Chronic systolic CHF (congestive heart failure) ([place]) 11/13/2016  . PVC (premature ventricular contraction) 11/13/2016  . Mixed hyperlipidemia 11/05/2016  . CKD (chronic kidney disease), stage III ([place]) 11/05/2016  . Tobacco abuse 11/05/2016  . Multifocal PVCs 11/05/2016  . Moderate mitral regurgitation 11/05/2016  . Microcytic anemia 11/05/2016  . Diabetes mellitus with complication ([place])   . Uncontrolled hypertension 06/18/2016  . Multifocal atrial tachycardia ([place]) 11/26/2014  . PVD (peripheral vascular disease) ([place]) 12/15/2012    Orientation RESPIRATION BLADDER Height & Weight     Self,Situation,Time,Place  Normal Continent Weight: 147 lb 4.3 oz (66.8 kg) Height:  5\' 5"  (165.1 cm)  BEHAVIORAL SYMPTOMS/MOOD NEUROLOGICAL BOWEL NUTRITION STATUS      Continent Diet (See d/c summary)  AMBULATORY STATUS COMMUNICATION OF NEEDS Skin   Extensive Assist  Verbally Normal                       Personal Care Assistance Level of Assistance  Bathing,Feeding,Dressing Bathing Assistance: Limited assistance Feeding assistance: Independent Dressing Assistance: Limited assistance     Functional Limitations Info  Sight,Hearing,Speech Sight Info: Adequate Hearing Info: Adequate Speech Info: Adequate    SPECIAL CARE FACTORS FREQUENCY  OT (By licensed OT),PT (By licensed PT)     PT Frequency: 5x/week OT Frequency: 5x/week            Contractures      Additional Factors Info  Code Status,Allergies Code Status Info: DNR Allergies Info: Epoetin Alfa, penecillins, hydrocodone, crestor, tape, Retacrit (epoetin (alfa)), Influenza Virus Vaccine Split, nsaids, lidocain, influenza vaccines, Percocet (oxycodone-acetaminophen), prednisone, Cefaclor           Current Medications (03/02/2021):  This is the current hospital active medication list Current Facility-Administered Medications  Medication Dose Route Frequency Provider Last Rate Last Admin  . acetaminophen (TYLENOL) tablet 500 mg  500 mg Oral Q6H PRN Wilford Corner, MD      . amiodarone (PACERONE) tablet 100 mg  100 mg Oral Q48H Barb Merino, MD   100 mg at 03/02/21 1341  . atorvastatin (LIPITOR) tablet 20 mg  20 mg Oral Daily Wilford Corner, MD   20 mg at 03/01/21 2213  . benzonatate (TESSALON) capsule 100 mg  100 mg Oral TID Wilford Corner, MD   100 mg at 03/02/21 0858  . carvedilol (COREG) tablet 12.5 mg  12.5 mg Oral BID WC Wilford Corner, MD   12.5 mg at 03/01/21 1557  . diphenhydrAMINE (  BENADRYL) capsule 25 mg  25 mg Oral Q6H PRN Wilford Corner, MD      . feeding supplement (ENSURE ENLIVE / ENSURE PLUS) liquid 237 mL  237 mL Oral BID BM Wilford Corner, MD   237 mL at 03/02/21 1341  . guaiFENesin (MUCINEX) 12 hr tablet 1,200 mg  1,200 mg Oral BID Wilford Corner, MD   1,200 mg at 03/02/21 0857  . hydrALAZINE (APRESOLINE) tablet 50 mg  50 mg Oral BID  Wilford Corner, MD   50 mg at 03/01/21 2213  . Ipratropium-Albuterol (COMBIVENT) respimat 1 puff  1 puff Inhalation Q6H PRN [place]y, Silver Huguenin, MD      . ondansetron (ZOFRAN) tablet 4 mg  4 mg Oral Q6H PRN Wilford Corner, MD      . pantoprazole (PROTONIX) injection 40 mg  40 mg Intravenous Q12H Wilford Corner, MD   40 mg at 03/02/21 0854  . polyethylene glycol (MIRALAX / GLYCOLAX) packet 17 g  17 g Oral Daily PRN Wilford Corner, MD      . sucralfate (CARAFATE) 1 GM/10ML suspension 1 g  1 g Oral TID WC & HS [name], MD   1 g at 03/02/21 1341  . torsemide (DEMADEX) tablet 20 mg  20 mg Oral Daily Wilford Corner, MD   20 mg at 03/02/21 1191     Discharge Medications: Please see discharge summary for a list of discharge medications.  Relevant Imaging Results:  Relevant Lab Results:   Additional Information SS# [id number] pt is not vaccinated. Asymptomatic. Covid positive at home on 5/4. Will be off of isolation precautions beginning on 5/14  [place], [place]

## 2021-03-03 DIAGNOSIS — K2991 Gastroduodenitis, unspecified, with bleeding: Secondary | ICD-10-CM | POA: Diagnosis not present

## 2021-03-03 MED ORDER — PANTOPRAZOLE SODIUM 40 MG PO TBEC
40.0000 mg | DELAYED_RELEASE_TABLET | Freq: Two times a day (BID) | ORAL | Status: DC
  Administered 2021-03-03 – 2021-03-14 (×22): 40 mg via ORAL
  Filled 2021-03-03 (×22): qty 1

## 2021-03-03 NOTE — Progress Notes (Signed)
PROGRESS NOTE    Krista Mason  DUK:[id number] DOB: October 23, 1935 DOA: 02/27/2021 PCP: Leighton Ruff, MD    Brief Narrative:  Krista Mason a 85 y.o.femalewith medical history significant ofchronic systolic CHF, A. fib on Eliquis, GERD, CKD stage IIIb, HTN, HLD, COPD Gold stage I(2018), presented with multiple complaints.  Patient started to have significant postnasal drip and congestion since early last week, she developed a dry cough with low-grade fever. She used self tested for COVID 02/22/21 which turned positive. She went to see her PCP at [facility], whoreferred her for infusion center. At the same time, for a week or so, she developed cramping-like abdominal pain, usually after she eats or drink.And she noticed her stool color has turned darker. Denies any nauseous vomiting. Patient is not vaccinated for COVID-19.  ED Course:No significant hypoxia, blood pressure stable, chest x-ray no acute infiltrates. Hb9.3 compared to 10.9, 2 weeks ago.    Assessment & Plan:   Active Problems:   Melena   GI bleed  Acute on chronic anemia secondary to upper GI bleeding: EGD 5/10 Dr. Michail Sermon, patient was found to have severe distal ulcerated esophageal stricture s/p dilatation.   Baseline hemoglobin 10-11.  Presented with hemoglobin of 8.9-8-7.9-7.9-7.9 Received 1 dose of Feraheme 5/11.  Hemoglobin is stable since then. As per GI recommendation, continue to hold Eliquis, soft diet and outpatient follow-up. On Protonix IV twice daily, will change to oral today.  Added Carafate.  COVID infection: No significant pneumonia on x-ray.  No hypoxia no fever.  Patient was given 3days IV remdesivir because of hospitalizations and mild symptoms with chronic morbidities. Was diagnosed at home on 5/4.  Documented positive test on 5/9.   Discontinue isolation precautions on 5/14.  Chronic systolic heart failure: Euvolemic.  On torsemide.  CKD stage IIIb: Fairly stable at about her  baseline.  Her baseline creatinine is about 2.  She follows up with nephrology as outpatient.  Essential hypertension: Stable on medications.  Paroxysmal A. fib: Rate controlled.  Eliquis on hold.  Tolerating Coreg and low-dose amiodarone.  Start mobilizing.  Continue to work with PT OT.  Refer to SNF.  DVT prophylaxis: SCDs Start: 02/27/21 1835   Code Status: DNR Family Communication: Daughter on the phone with the patient. Disposition Plan: Status is: Inpatient  Remains inpatient appropriate because:IV treatments appropriate due to intensity of illness or inability to take PO and Inpatient level of care appropriate due to severity of illness   Dispo: The patient is from: Home              Anticipated d/c is to: Skilled nursing facility.              Patient currently is medically stable for discharge.   Difficult to place patient No  Consultants:   Gastroenterology  Procedures:   Upper GI endoscopy 5/10, severe distal ulcerated esophageal stricture.  Antimicrobials:   None   Subjective: Patient seen and examined.  No overnight events.  Still coughing and unable to get out phlegm.  Overall feels with a little bit more energy than usual.  Denies any difficulty eating.  Normal bowel movement.   Objective: Vitals:   03/02/21 1639 03/02/21 2212 03/03/21 0517 03/03/21 1255  BP: (!) 142/59 (!) 137/51 (!) 129/44   Pulse: 67 68 73   Resp: 17 18 16    Temp: 98.7 F (37.1 C) 99 F (37.2 C) 98.6 F (37 C)   TempSrc: Oral Oral Oral   SpO2: 92% 94%  93% 94%  Weight:      Height:        Intake/Output Summary (Last 24 hours) at 03/03/2021 1322 Last data filed at 03/02/2021 2225 Gross per 24 hour  Intake --  Output 1301 ml  Net -1301 ml   Filed Weights   02/27/21 1302 02/27/21 2015 02/28/21 1331  Weight: 68 kg 66.8 kg 66.8 kg    Examination:  General exam: Appears calm and comfortable at rest.  On room air. Respiratory system: Clear to auscultation.  Some conducted  airway sounds. Cardiovascular system: S1 & S2 heard, RRR.  Gastrointestinal system: Abdomen is nondistended, soft and nontender. No organomegaly or masses felt. Normal bowel sounds heard. Central nervous system: Alert and oriented. No focal neurological deficits. Extremities: Symmetric 5 x 5 power. Skin: No rashes, lesions or ulcers Psychiatry: Judgement and insight appear normal. Mood & affect normal.    Data Reviewed: I have personally reviewed following labs and imaging studies  CBC: Recent Labs  Lab 02/27/21 1353 02/27/21 2330 02/28/21 0015 03/01/21 0353 03/02/21 0808  WBC 6.1  --  5.9 6.5 6.3  NEUTROABS 4.5  --   --   --  4.5  HGB 9.3* 8.4* 8.4* 7.9* 7.9*  HCT 29.4* 26.1* 26.2* 24.6* 24.5*  MCV 90.7  --  89.7 89.1 88.4  PLT 169  --  152 166 829   Basic Metabolic Panel: Recent Labs  Lab 02/27/21 1353 02/28/21 0015 03/01/21 0353  NA 134* 132* 133*  K 3.9 3.4* 3.5  CL 101 97* 100  CO2 25 24 24   GLUCOSE 170* 126* 106*  BUN 37* 35* 33*  CREATININE 1.96* 1.92* 2.01*  CALCIUM 9.2 8.7* 8.7*   GFR: Estimated Creatinine Clearance: 19.4 mL/min (A) (by C-G formula based on SCr of 2.01 mg/dL (H)). Liver Function Tests: Recent Labs  Lab 02/27/21 1353  AST 24  ALT 24  ALKPHOS 68  BILITOT 0.4  PROT 6.3*  ALBUMIN 3.1*   No results for input(s): LIPASE, AMYLASE in the last 168 hours. No results for input(s): AMMONIA in the last 168 hours. Coagulation Profile: Recent Labs  Lab 02/27/21 1849  INR 1.4*   Cardiac Enzymes: No results for input(s): CKTOTAL, CKMB, CKMBINDEX, TROPONINI in the last 168 hours. BNP (last 3 results) No results for input(s): PROBNP in the last 8760 hours. HbA1C: No results for input(s): HGBA1C in the last 72 hours. CBG: No results for input(s): GLUCAP in the last 168 hours. Lipid Profile: No results for input(s): CHOL, HDL, LDLCALC, TRIG, CHOLHDL, LDLDIRECT in the last 72 hours. Thyroid Function Tests: No results for input(s): TSH,  T4TOTAL, FREET4, T3FREE, THYROIDAB in the last 72 hours. Anemia Panel: No results for input(s): VITAMINB12, FOLATE, FERRITIN, TIBC, IRON, RETICCTPCT in the last 72 hours. Sepsis Labs: No results for input(s): PROCALCITON, LATICACIDVEN in the last 168 hours.  Recent Results (from the past 240 hour(s))  SARS CORONAVIRUS 2 (TAT 6-24 HRS) Nasopharyngeal Nasopharyngeal Swab     Status: Abnormal   Collection Time: 02/27/21  6:03 PM   Specimen: Nasopharyngeal Swab  Result Value Ref Range Status   SARS Coronavirus 2 POSITIVE (A) NEGATIVE Final    Comment: (NOTE) SARS-CoV-2 target nucleic acids are DETECTED.  The SARS-CoV-2 RNA is generally detectable in upper and lower respiratory specimens during the acute phase of infection. Positive results are indicative of the presence of SARS-CoV-2 RNA. Clinical correlation with patient history and other diagnostic information is  necessary to determine patient infection status. Positive results do  not rule out bacterial infection or co-infection with other viruses.  The expected result is Negative.  Fact Sheet for Patients: SugarRoll.be  Fact Sheet for Healthcare Providers: https://www.woods-mathews.com/  This test is not yet approved or cleared by the [place] FDA and  has been authorized for detection and/or diagnosis of SARS-CoV-2 by FDA under an Emergency Use Authorization (EUA). This EUA will remain  in effect (meaning this test can be used) for the duration of the COVID-19 declaration under Section 564(b)(1) of the Act, 21 U. S.C. section 360bbb-3(b)(1), unless the authorization is terminated or revoked sooner.   Performed at [facility], [place] [address], [place], [place] [address]          Radiology Studies: No results found.      Scheduled Meds: . amiodarone  100 mg Oral Q48H  . atorvastatin  20 mg Oral Daily  . benzonatate  100 mg Oral TID  . carvedilol  12.5 mg Oral  BID WC  . feeding supplement  237 mL Oral BID BM  . guaiFENesin  1,200 mg Oral BID  . hydrALAZINE  50 mg Oral BID  . pantoprazole  40 mg Oral BID  . sucralfate  1 g Oral TID WC & HS  . torsemide  20 mg Oral Daily   Continuous Infusions:    LOS: 4 days    Time spent: 30 minutes    Barb Merino, MD [facility] Pager [phone]

## 2021-03-03 NOTE — TOC Progression Note (Signed)
Transition of Care [facility]) - Progression Note    Patient Details  Name: Krista Mason MRN: [id number] Date of Birth: 01/25/1936  Transition of Care [facility]) CM/SW [place], [place] Phone Number: 03/03/2021, 3:38 PM  Clinical Narrative:     Pt still has no SNF offers. Barriers include covid and insurance network. CSW contacted [employer] and [employer] to clarify that pt will be off of covid precautions on 5/14 and that her first positive test was at home on 02/22/21. Awaiting responses.    Expected Discharge Plan: [place] Barriers to Discharge: Continued Medical Work up,SNF Covid  Expected Discharge Plan and Services Expected Discharge Plan: [place] arrangements for the past 2 months: [place]                                       Social Determinants of Health (SDOH) Interventions    Readmission Risk Interventions No flowsheet data found.

## 2021-03-03 NOTE — Plan of Care (Signed)

## 2021-03-03 NOTE — Progress Notes (Signed)
Occupational Therapy Treatment Patient Details Name: Krista Mason MRN: [id number] DOB: 22-Dec-1935 Today's Date: 03/03/2021    History of present illness Patient admitted with Acute on chronic anemia secondary to upper GI bleed and found to be COVID positive. Krista Mason is a 85 y.o. female with medical history significant of chronic systolic CHF, A. fib on Eliquis, GERD, CKD stage IIIb, HTN, HLD, COPD Gold stage I (2018). EGD 5/10 found erosive esophagiti.   OT comments  Pt. Seen for skilled OT treatment.  Motivated and eager for participation. Able to complete bed mobility, lb dressing, and short distance ambulation to recliner. Refer to vital signs section for orthostatic bps. Pt. Able to maintain 94% O2 on room air.  Eager for continued progression with mobility with PT.  States "I close my eyes and picture myself moving around the room so easily and then im so weak".  Encouragement and emotional support provided as pt. Mentioned feeling very frustrated with her weakness.    Follow Up Recommendations       Equipment Recommendations  None recommended by OT    Recommendations for Other Services      Precautions / Restrictions Precautions Precautions: Fall       Mobility Bed Mobility Overal bed mobility: Needs Assistance Bed Mobility: Rolling;Sidelying to Sit Rolling: Supervision Sidelying to sit: Supervision            Transfers Overall transfer level: Needs assistance Equipment used: Rolling walker (2 wheeled) Transfers: Sit to/from [facility] Sit to Stand: Min guard Stand pivot transfers: Min guard       General transfer comment: Min guard to stand at side of bed. Patient symptomatic reporting mild light headedness. See flow  sheet for orthostatic BPs    Balance                                           ADL either performed or assessed with clinical judgement   ADL Overall ADL's : Needs assistance/impaired                      Lower Body Dressing: Set up;Sitting/lateral leans Lower Body Dressing Details (indicate cue type and reason): able to bring b les up to chest one at a time to reach feet for socks Toilet Transfer: Min Marine scientist Details (indicate cue type and reason): simulated from eob approx. 6 steps with a pivot to recliner         Functional mobility during ADLs: Min guard;Rolling walker General ADL Comments: cues for breathing strategies and rest breaks.  pt. reports steady light headedness but did not progress. vitals taken throughout remained 94%02 on room out. wants to progess and is frustrated with fatigue.  provided emotional encouragement. rec. bsc vs. pure wik throughout the day to cont. to progress activity in preparation for home     Vision       Perception     Praxis      Cognition Arousal/Alertness: Awake/alert Behavior During Therapy: [facility] for tasks assessed/performed Overall Cognitive Status: Within Functional Limits for tasks assessed                                          Exercises     Shoulder Instructions  General Comments      Pertinent Vitals/ Pain          Home Living                                          Prior Functioning/Environment              Frequency  Min 2X/week        Progress Toward Goals  OT Goals(current goals can now be found in the care plan section)  Progress towards OT goals: Progressing toward goals     Plan Discharge plan remains appropriate    Co-evaluation                 AM-PAC OT "6 Clicks" Daily Activity     Outcome Measure   Help from another person eating meals?: None Help from another person taking care of personal grooming?: A Little Help from another person toileting, which includes using toliet, bedpan, or urinal?: A Little Help from another person bathing (including washing, rinsing, drying)?: A Little Help from another  person to put on and taking off regular upper body clothing?: A Little Help from another person to put on and taking off regular lower body clothing?: A Little 6 Click Score: 19    End of Session Equipment Utilized During Treatment: Rolling walker  OT Visit Diagnosis: Muscle weakness (generalized) (M62.81)   Activity Tolerance Patient tolerated treatment well   Patient Left in chair;with call bell/phone within reach   Nurse Communication Other (comment) (reviewed bps, o2 levels, and how well pt. had done.  also discussed rec. for bsc as much as possible vs. pure wik to promote mobility and endurance)        Time: 8592-9244 OT Time Calculation (min): 29 min  Charges: OT General Charges $OT Visit: 1 Visit OT Treatments $Self Care/Home Management : 23-37 mins  Sonia Baller, COTA/L Acute Rehabilitation [phone]  03/03/2021, 1:01 PM

## 2021-03-03 NOTE — Evaluation (Signed)
Physical Therapy Evaluation Patient Details Name: Krista Mason MRN: [id number] DOB: 04/24/1936 Today's Date: 03/03/2021   History of Present Illness  Patient admitted with Acute on chronic anemia secondary to upper GI bleed and found to be COVID positive. PMH includes chronic systolic CHF, A. fib on Eliquis, GERD, CKD stage IIIb, HTN, HLD, COPD Gold stage I (2018). EGD 5/10 found erosive esophagiti.  Clinical Impression  Patient presents with generalized weakness, decreased activity tolerance, fatigue and impaired mobility s/p above. Pt is from [place] and reports using rollator for ambulation PTA- able to walk to dining hall and care for self. Today, pt requires Min guard assist for multiple stands from chair and BSC as well as taking a few steps to return to bed post peri-care. Reports mild dizziness with standing which resolves. Encouraged OOB to chair as much as tolerated and walking to bathroom with nursing. Would benefit from SNF to maximize independence and mobility prior to return home. Will follow acutely.    Follow Up Recommendations SNF;Supervision for mobility/OOB    Equipment Recommendations  None recommended by PT    Recommendations for Other Services       Precautions / Restrictions Precautions Precautions: Fall Restrictions Weight Bearing Restrictions: No      Mobility  Bed Mobility Overal bed mobility: Needs Assistance Bed Mobility: Sit to Supine Rolling: Supervision Sidelying to sit: Supervision   Sit to supine: Supervision;HOB elevated   General bed mobility comments: No assist needed.    Transfers Overall transfer level: Needs assistance Equipment used: Rolling walker (2 wheeled) Transfers: Sit to/from Stand Sit to Stand: Min guard Stand pivot transfers: Min guard       General transfer comment: Min guard to stand from chair x1, SPT chair to [facility] with Min guard- incontinent of urine during transfers. Stood from [facility] x5. Flexed  trunk.  Ambulation/Gait Ambulation/Gait assistance: Min guard Gait Distance (Feet): 6 Feet Assistive device: Rolling walker (2 wheeled) Gait Pattern/deviations: Trunk flexed;Step-through pattern;Decreased stride length     General Gait Details: Able to take a few steps to get to bed with Min guard assist and use of RW; fatigued from multiple stands from [facility].  Stairs            Wheelchair Mobility    Modified Rankin (Stroke Patients Only)       Balance Overall balance assessment: Mild deficits observed, not formally tested                                           Pertinent Vitals/Pain Pain Assessment: No/denies pain    Home Living Family/patient expects to be discharged to:: Private residence [facility] ILF) Living Arrangements: Alone Available Help at Discharge: Friend(s);Available PRN/intermittently Type of Home: Independent living facility Home Access: Level entry     Home Layout: One level Home Equipment: Cane - single point;Bedside commode;Grab bars - tub/shower;Grab bars - toilet;Walker - 4 wheels;Walker - 2 wheels      Prior Function Level of Independence: Independent with assistive device(s)         Comments: uses rollator. Ambulates to dining room and walks around after meals.     Hand Dominance   Dominant Hand: Left    Extremity/Trunk Assessment   Upper Extremity Assessment Upper Extremity Assessment: Defer to OT evaluation    Lower Extremity Assessment Lower Extremity Assessment: Generalized weakness    Cervical / Trunk Assessment  Cervical / Trunk Assessment: Normal  Communication   Communication: No difficulties  Cognition Arousal/Alertness: Awake/alert Behavior During Therapy: WFL for tasks assessed/performed Overall Cognitive Status: Within Functional Limits for tasks assessed                                 General Comments: "sometimes I don't feel like all my cyclinders are running  correctly."      General Comments General comments (skin integrity, edema, etc.): Assist with pericare on BSC. Pt with blackish stool. RN made aware.    Exercises     Assessment/Plan    PT Assessment Patient needs continued PT services  PT Problem List Decreased strength;Decreased mobility;Decreased balance;Decreased activity tolerance;Decreased cognition;Cardiopulmonary status limiting activity       PT Treatment Interventions Therapeutic exercise;Patient/family education;Therapeutic activities;Functional mobility training;Gait training;Balance training;DME instruction    PT Goals (Current goals can be found in the Care Plan section)  Acute Rehab PT Goals Patient Stated Goal: to get stronger and walk around the room, go to rehab and then go home PT Goal Formulation: With patient Time For Goal Achievement: 03/17/21 Potential to Achieve Goals: Good    Frequency Min 2X/week   Barriers to discharge Decreased caregiver support      Co-evaluation               AM-PAC PT "6 Clicks" Mobility  Outcome Measure Help needed turning from your back to your side while in a flat bed without using bedrails?: None Help needed moving from lying on your back to sitting on the side of a flat bed without using bedrails?: None Help needed moving to and from a bed to a chair (including a wheelchair)?: A Little Help needed standing up from a chair using your arms (e.g., wheelchair or bedside chair)?: A Little Help needed to walk in hospital room?: A Little Help needed climbing 3-5 steps with a railing? : A Little 6 Click Score: 20    End of Session Equipment Utilized During Treatment: Gait belt Activity Tolerance: Patient limited by fatigue Patient left: in bed;with call bell/phone within reach;with bed alarm set Nurse Communication: Other (comment);Mobility status (purewick) PT Visit Diagnosis: Difficulty in walking, not elsewhere classified (R26.2);Muscle weakness (generalized)  (M62.81);Unsteadiness on feet (R26.81)    Time: 1444-1510 PT Time Calculation (min) (ACUTE ONLY): 26 min   Charges:   PT Evaluation $PT Eval Moderate Complexity: 1 Mod PT Treatments $Therapeutic Activity: 8-22 mins        Marisa Severin, PT, DPT Acute Rehabilitation Services Pager [phone] Office [place] 03/03/2021, 3:57 PM

## 2021-03-04 ENCOUNTER — Other Ambulatory Visit (HOSPITAL_COMMUNITY): Payer: Self-pay | Admitting: Internal Medicine

## 2021-03-04 DIAGNOSIS — K2991 Gastroduodenitis, unspecified, with bleeding: Secondary | ICD-10-CM | POA: Diagnosis not present

## 2021-03-04 NOTE — TOC Progression Note (Signed)
Transition of Care [facility]) - Progression Note    Patient Details  Name: ARYAHI DENZLER MRN: [id number] Date of Birth: 08/01/1936  Transition [facility]) CM/SW Contact  Vinie Sill, [place] Phone Number: 03/04/2021, 12:12 PM  Clinical Narrative:     Patient has no bed offers.  TOC will continue to follow and assist with discharge planning.  Thurmond Butts, MSW, LCSW Clinical Social Worker   Expected Discharge Plan: Skilled Nursing Facility Barriers to Discharge: Continued Medical Work up,SNF Covid  Expected Discharge Plan and Services Expected Discharge Plan: [place] arrangements for the past 2 months: [place]                                       Social Determinants of Health (SDOH) Interventions    Readmission Risk Interventions No flowsheet data found.

## 2021-03-04 NOTE — Progress Notes (Signed)
PROGRESS NOTE    Krista Mason  UDJ:[id number] DOB: 1936-10-18 DOA: 02/27/2021 PCP: Leighton Ruff, MD    Brief Narrative:  Krista Mason a 85 y.o.femalewith medical history significant ofchronic systolic CHF, A. fib on Eliquis, GERD, CKD stage IIIb, HTN, HLD, COPD Gold stage I(2018), presented with multiple complaints.  Patient started to have significant postnasal drip and congestion since early last week, she developed a dry cough with low-grade fever. She used self tested for COVID 02/22/21 which turned positive. She went to see her PCP at [facility], whoreferred her for infusion center. At the same time, for a week or so, she developed cramping-like abdominal pain, usually after she eats or drink.And she noticed her stool color has turned darker. Denies any nauseous vomiting. Patient is not vaccinated for COVID-19.  ED Course:No significant hypoxia, blood pressure stable, chest x-ray no acute infiltrates. Hb9.3 compared to 10.9, 2 weeks ago.    Assessment & Plan:   Active Problems:   Melena   GI bleed  Acute on chronic anemia secondary to upper GI bleeding: EGD 5/10 Dr. Michail Sermon, patient was found to have severe distal ulcerated esophageal stricture s/p dilatation.   Baseline hemoglobin 10-11.  Presented with hemoglobin of 8.9-8-7.9-7.9-7.9 Received 1 dose of Feraheme 5/11.  Hemoglobin is stable since then. As per GI recommendation, continue to hold Eliquis, soft diet and outpatient follow-up. Currently on high-dose Protonix and Carafate.  COVID infection: No significant pneumonia on x-ray.  No hypoxia no fever.  Patient was given 3days IV remdesivir because of hospitalizations and mild symptoms with chronic morbidities. Was diagnosed at home on 5/4.  Documented positive test on 5/9.   No more isolation needed.  Chronic systolic heart failure: Euvolemic.  On torsemide.  CKD stage IIIb: Fairly stable at about her baseline.  Her baseline creatinine is about 2.  She  follows up with nephrology as outpatient.  Essential hypertension: Stable on medications.  Paroxysmal A. fib: Rate controlled.  Eliquis on hold.  Tolerating Coreg and low-dose amiodarone.  Start mobilizing.  Continue to work with PT OT.  Refer to SNF.  DVT prophylaxis: SCDs Start: 02/27/21 1835   Code Status: DNR Family Communication: Daughter on the phone with the patient 5/13. Disposition Plan: Status is: Inpatient  Remains inpatient appropriate because:IV treatments appropriate due to intensity of illness or inability to take PO and Inpatient level of care appropriate due to severity of illness   Dispo: The patient is from: Home              Anticipated d/c is to: Skilled nursing facility.              Patient currently is medically stable for discharge.   Difficult to place patient No  Consultants:   Gastroenterology  Procedures:   Upper GI endoscopy 5/10, severe distal ulcerated esophageal stricture.  Antimicrobials:   None   Subjective: Patient seen and examined.  No overnight events.  Very happy to come off the COVID isolation. Patient does have some dry cough otherwise no symptoms.  Feels extremely weak. She will continue to work with physical therapy and nurses while in the hospital and anticipating to go to subacute rehab for short-term rehab.   Objective: Vitals:   03/03/21 1255 03/03/21 1517 03/03/21 2153 03/04/21 0605  BP:  (!) 150/55 (!) 146/40 (!) 141/57  Pulse:  70 68 69  Resp:  18 16 17   Temp:  98.7 F (37.1 C) 98.8 F (37.1 C) 98.3 F (36.8 C)  TempSrc:  Oral Oral   SpO2: 94% 97% 93% 98%  Weight:      Height:        Intake/Output Summary (Last 24 hours) at 03/04/2021 1151 Last data filed at 03/04/2021 0900 Gross per 24 hour  Intake 680 ml  Output 800 ml  Net -120 ml   Filed Weights   02/27/21 1302 02/27/21 2015 02/28/21 1331  Weight: 68 kg 66.8 kg 66.8 kg    Examination:  General exam: Appears calm and comfortable at rest.  On  room air. Respiratory system: She has some conducted airway sounds.. Cardiovascular system: S1 & S2 heard, RRR.  Gastrointestinal system: Abdomen is nondistended, soft and nontender. No organomegaly or masses felt. Normal bowel sounds heard. Central nervous system: Alert and oriented. No focal neurological deficits. Extremities: Symmetric 5 x 5 power. Skin: No rashes, lesions or ulcers Psychiatry: Judgement and insight appear normal. Mood & affect normal.    Data Reviewed: I have personally reviewed following labs and imaging studies  CBC: Recent Labs  Lab 02/27/21 1353 02/27/21 2330 02/28/21 0015 03/01/21 0353 03/02/21 0808  WBC 6.1  --  5.9 6.5 6.3  NEUTROABS 4.5  --   --   --  4.5  HGB 9.3* 8.4* 8.4* 7.9* 7.9*  HCT 29.4* 26.1* 26.2* 24.6* 24.5*  MCV 90.7  --  89.7 89.1 88.4  PLT 169  --  152 166 644   Basic Metabolic Panel: Recent Labs  Lab 02/27/21 1353 02/28/21 0015 03/01/21 0353  NA 134* 132* 133*  K 3.9 3.4* 3.5  CL 101 97* 100  CO2 25 24 24   GLUCOSE 170* 126* 106*  BUN 37* 35* 33*  CREATININE 1.96* 1.92* 2.01*  CALCIUM 9.2 8.7* 8.7*   GFR: Estimated Creatinine Clearance: 19.4 mL/min (A) (by C-G formula based on SCr of 2.01 mg/dL (H)). Liver Function Tests: Recent Labs  Lab 02/27/21 1353  AST 24  ALT 24  ALKPHOS 68  BILITOT 0.4  PROT 6.3*  ALBUMIN 3.1*   No results for input(s): LIPASE, AMYLASE in the last 168 hours. No results for input(s): AMMONIA in the last 168 hours. Coagulation Profile: Recent Labs  Lab 02/27/21 1849  INR 1.4*   Cardiac Enzymes: No results for input(s): CKTOTAL, CKMB, CKMBINDEX, TROPONINI in the last 168 hours. BNP (last 3 results) No results for input(s): PROBNP in the last 8760 hours. HbA1C: No results for input(s): HGBA1C in the last 72 hours. CBG: No results for input(s): GLUCAP in the last 168 hours. Lipid Profile: No results for input(s): CHOL, HDL, LDLCALC, TRIG, CHOLHDL, LDLDIRECT in the last 72  hours. Thyroid Function Tests: No results for input(s): TSH, T4TOTAL, FREET4, T3FREE, THYROIDAB in the last 72 hours. Anemia Panel: No results for input(s): VITAMINB12, FOLATE, FERRITIN, TIBC, IRON, RETICCTPCT in the last 72 hours. Sepsis Labs: No results for input(s): PROCALCITON, LATICACIDVEN in the last 168 hours.  Recent Results (from the past 240 hour(s))  SARS CORONAVIRUS 2 (TAT 6-24 HRS) Nasopharyngeal Nasopharyngeal Swab     Status: Abnormal   Collection Time: 02/27/21  6:03 PM   Specimen: Nasopharyngeal Swab  Result Value Ref Range Status   SARS Coronavirus 2 POSITIVE (A) NEGATIVE Final    Comment: (NOTE) SARS-CoV-2 target nucleic acids are DETECTED.  The SARS-CoV-2 RNA is generally detectable in upper and lower respiratory specimens during the acute phase of infection. Positive results are indicative of the presence of SARS-CoV-2 RNA. Clinical correlation with patient history and other diagnostic information is  necessary  to determine patient infection status. Positive results do not rule out bacterial infection or co-infection with other viruses.  The expected result is Negative.  Fact Sheet for Patients: SugarRoll.be  Fact Sheet for Healthcare Providers: https://www.woods-mathews.com/  This test is not yet approved or cleared by the [place] FDA and  has been authorized for detection and/or diagnosis of SARS-CoV-2 by FDA under an Emergency Use Authorization (EUA). This EUA will remain  in effect (meaning this test can be used) for the duration of the COVID-19 declaration under Section 564(b)(1) of the Act, 21 U. S.C. section 360bbb-3(b)(1), unless the authorization is terminated or revoked sooner.   Performed at [facility], [place] [address], [place], [place] [address]          Radiology Studies: No results found.      Scheduled Meds: . amiodarone  100 mg Oral Q48H  . atorvastatin  20 mg Oral Daily   . benzonatate  100 mg Oral TID  . carvedilol  12.5 mg Oral BID WC  . feeding supplement  237 mL Oral BID BM  . guaiFENesin  1,200 mg Oral BID  . hydrALAZINE  50 mg Oral BID  . pantoprazole  40 mg Oral BID  . sucralfate  1 g Oral TID WC & HS  . torsemide  20 mg Oral Daily   Continuous Infusions:    LOS: 5 days    Time spent: 30 minutes    Barb Merino, MD [facility] Pager [phone]

## 2021-03-05 DIAGNOSIS — K2991 Gastroduodenitis, unspecified, with bleeding: Secondary | ICD-10-CM | POA: Diagnosis not present

## 2021-03-05 MED ORDER — GUAIFENESIN 100 MG/5ML PO SOLN
10.0000 mL | ORAL | Status: DC | PRN
  Administered 2021-03-08 – 2021-03-14 (×5): 200 mg via ORAL
  Filled 2021-03-05 (×8): qty 10

## 2021-03-05 NOTE — Plan of Care (Signed)
  Problem: Education: Goal: Knowledge of General Education information will improve Description: Including pain rating scale, medication(s)/side effects and non-pharmacologic comfort measures Outcome: Progressing   Problem: Health Behavior/Discharge Planning: Goal: Ability to manage health-related needs will improve Outcome: Progressing   Problem: Clinical Measurements: Goal: Ability to maintain clinical measurements within normal limits will improve Outcome: Progressing Goal: Respiratory complications will improve Outcome: Progressing   Problem: Activity: Goal: Risk for activity intolerance will decrease Outcome: Progressing   Problem: Elimination: Goal: Will not experience complications related to bowel motility Outcome: Progressing Goal: Will not experience complications related to urinary retention Outcome: Progressing   Problem: Pain Managment: Goal: General experience of comfort will improve Outcome: Progressing   Problem: Safety: Goal: Ability to remain free from injury will improve Outcome: Progressing   Problem: Skin Integrity: Goal: Risk for impaired skin integrity will decrease Outcome: Progressing

## 2021-03-05 NOTE — Progress Notes (Signed)
PROGRESS NOTE    Krista Mason  ENI:[id number] DOB: 1936-04-18 DOA: 02/27/2021 PCP: Leighton Ruff, MD    Brief Narrative:  Krista Mason a 85 y.o.femalewith medical history significant ofchronic systolic CHF, A. fib on Eliquis, GERD, CKD stage IIIb, HTN, HLD, COPD Gold stage I(2018), presented with multiple complaints.  Patient started to have significant postnasal drip and congestion since early last week, she developed a dry cough with low-grade fever. She used self tested for COVID 02/22/21 which turned positive. She went to see her PCP at [facility], whoreferred her for infusion center. At the same time, for a week or so, she developed cramping-like abdominal pain, usually after she eats or drink.And she noticed her stool color has turned darker. Denies any nauseous vomiting. Patient is not vaccinated for COVID-19.  ED Course:No significant hypoxia, blood pressure stable, chest x-ray no acute infiltrates. Hb9.3 compared to 10.9, 2 weeks ago.    Assessment & Plan:   Active Problems:   Melena   GI bleed  Acute on chronic anemia secondary to upper GI bleeding: EGD 5/10 Dr. Michail Sermon, patient was found to have severe distal ulcerated esophageal stricture s/p dilatation.   Baseline hemoglobin 10-11.  Presented with hemoglobin of 8.9-8-7.9-7.9-7.9 Received 1 dose of Feraheme 5/11.  Hemoglobin is stable since then. As per GI recommendation, continue to hold Eliquis, soft diet and outpatient follow-up. Currently on high-dose Protonix and Carafate. Recheck hemoglobin tomorrow morning.  COVID infection: No significant pneumonia on x-ray.  No hypoxia no fever.  Patient was given 3days IV remdesivir because of hospitalizations and mild symptoms with chronic morbidities. Was diagnosed at home on 5/4.  Documented positive test on 5/9.   No more isolation needed.  Chronic systolic heart failure: Euvolemic.  On torsemide.  CKD stage IIIb: Fairly stable at about her baseline.   Her baseline creatinine is about 2.  She follows up with nephrology as outpatient.  Essential hypertension: Stable on medications.  Paroxysmal A. fib: Rate controlled.  Eliquis on hold.  Tolerating Coreg and low-dose amiodarone.  Continue to work with PT OT.  Refer to SNF.  DVT prophylaxis: SCDs Start: 02/27/21 1835   Code Status: DNR Family Communication: None today. Disposition Plan: Status is: Inpatient  Remains inpatient appropriate because:IV treatments appropriate due to intensity of illness or inability to take PO and Inpatient level of care appropriate due to severity of illness   Dispo: The patient is from: Home              Anticipated d/c is to: Skilled nursing facility.              Patient currently is medically stable for discharge.   Difficult to place patient No  Consultants:   Gastroenterology  Procedures:   Upper GI endoscopy 5/10, severe distal ulcerated esophageal stricture.  Antimicrobials:   None   Subjective: Patient seen and examined.  No overnight events.  Cough is better.  Afebrile.  Not mobilized yet.   Objective: Vitals:   03/04/21 0605 03/04/21 1341 03/04/21 2050 03/05/21 0552  BP: (!) 141/57 (!) 141/53 (!) 105/40 (!) 133/55  Pulse: 69 69 70 73  Resp: 17 16 17 18   Temp: 98.3 F (36.8 C) 98.5 F (36.9 C)  98.9 F (37.2 C)  TempSrc:  Oral Oral Oral  SpO2: 98% 96% 98% 93%  Weight:      Height:        Intake/Output Summary (Last 24 hours) at 03/05/2021 1330 Last data filed at 03/05/2021  0600 Gross per 24 hour  Intake 240 ml  Output 700 ml  Net -460 ml   Filed Weights   02/27/21 1302 02/27/21 2015 02/28/21 1331  Weight: 68 kg 66.8 kg 66.8 kg    Examination:  General exam: Appears calm and comfortable at rest.  On room air. Respiratory system: She has some conducted airway sounds.. Cardiovascular system: S1 & S2 heard, RRR.  Gastrointestinal system: Abdomen is nondistended, soft and nontender. No organomegaly or masses  felt. Normal bowel sounds heard. Central nervous system: Alert and oriented. No focal neurological deficits. Extremities: Symmetric 5 x 5 power. Skin: No rashes, lesions or ulcers Psychiatry: Judgement and insight appear normal. Mood & affect normal.    Data Reviewed: I have personally reviewed following labs and imaging studies  CBC: Recent Labs  Lab 02/27/21 1353 02/27/21 2330 02/28/21 0015 03/01/21 0353 03/02/21 0808  WBC 6.1  --  5.9 6.5 6.3  NEUTROABS 4.5  --   --   --  4.5  HGB 9.3* 8.4* 8.4* 7.9* 7.9*  HCT 29.4* 26.1* 26.2* 24.6* 24.5*  MCV 90.7  --  89.7 89.1 88.4  PLT 169  --  152 166 588   Basic Metabolic Panel: Recent Labs  Lab 02/27/21 1353 02/28/21 0015 03/01/21 0353  NA 134* 132* 133*  K 3.9 3.4* 3.5  CL 101 97* 100  CO2 25 24 24   GLUCOSE 170* 126* 106*  BUN 37* 35* 33*  CREATININE 1.96* 1.92* 2.01*  CALCIUM 9.2 8.7* 8.7*   GFR: Estimated Creatinine Clearance: 19.4 mL/min (A) (by C-G formula based on SCr of 2.01 mg/dL (H)). Liver Function Tests: Recent Labs  Lab 02/27/21 1353  AST 24  ALT 24  ALKPHOS 68  BILITOT 0.4  PROT 6.3*  ALBUMIN 3.1*   No results for input(s): LIPASE, AMYLASE in the last 168 hours. No results for input(s): AMMONIA in the last 168 hours. Coagulation Profile: Recent Labs  Lab 02/27/21 1849  INR 1.4*   Cardiac Enzymes: No results for input(s): CKTOTAL, CKMB, CKMBINDEX, TROPONINI in the last 168 hours. BNP (last 3 results) No results for input(s): PROBNP in the last 8760 hours. HbA1C: No results for input(s): HGBA1C in the last 72 hours. CBG: No results for input(s): GLUCAP in the last 168 hours. Lipid Profile: No results for input(s): CHOL, HDL, LDLCALC, TRIG, CHOLHDL, LDLDIRECT in the last 72 hours. Thyroid Function Tests: No results for input(s): TSH, T4TOTAL, FREET4, T3FREE, THYROIDAB in the last 72 hours. Anemia Panel: No results for input(s): VITAMINB12, FOLATE, FERRITIN, TIBC, IRON, RETICCTPCT in the  last 72 hours. Sepsis Labs: No results for input(s): PROCALCITON, LATICACIDVEN in the last 168 hours.  Recent Results (from the past 240 hour(s))  SARS CORONAVIRUS 2 (TAT 6-24 HRS) Nasopharyngeal Nasopharyngeal Swab     Status: Abnormal   Collection Time: 02/27/21  6:03 PM   Specimen: Nasopharyngeal Swab  Result Value Ref Range Status   SARS Coronavirus 2 POSITIVE (A) NEGATIVE Final    Comment: (NOTE) SARS-CoV-2 target nucleic acids are DETECTED.  The SARS-CoV-2 RNA is generally detectable in upper and lower respiratory specimens during the acute phase of infection. Positive results are indicative of the presence of SARS-CoV-2 RNA. Clinical correlation with patient history and other diagnostic information is  necessary to determine patient infection status. Positive results do not rule out bacterial infection or co-infection with other viruses.  The expected result is Negative.  Fact Sheet for Patients: SugarRoll.be  Fact Sheet for Healthcare Providers: https://www.woods-mathews.com/  This  test is not yet approved or cleared by the [place] and  has been authorized for detection and/or diagnosis of SARS-CoV-2 by FDA under an Emergency Use Authorization (EUA). This EUA will remain  in effect (meaning this test can be used) for the duration of the COVID-19 declaration under Section 564(b)(1) of the Act, 21 U. S.C. section 360bbb-3(b)(1), unless the authorization is terminated or revoked sooner.   Performed at [facility], [place] [address] [place], [place] [address]          Radiology Studies: No results found.      Scheduled Meds: . amiodarone  100 mg Oral Q48H  . atorvastatin  20 mg Oral Daily  . benzonatate  100 mg Oral TID  . carvedilol  12.5 mg Oral BID WC  . feeding supplement  237 mL Oral BID BM  . guaiFENesin  1,200 mg Oral BID  . hydrALAZINE  50 mg Oral BID  . pantoprazole  40 mg Oral BID  .  sucralfate  1 g Oral TID WC & HS  . torsemide  20 mg Oral Daily   Continuous Infusions:    LOS: 6 days    Time spent: 30 minutes    Barb Merino, MD [facility] Pager [phone]

## 2021-03-06 DIAGNOSIS — K2991 Gastroduodenitis, unspecified, with bleeding: Secondary | ICD-10-CM | POA: Diagnosis not present

## 2021-03-06 LAB — HEMOGLOBIN AND HEMATOCRIT, BLOOD
HCT: 27.1 % — ABNORMAL LOW (ref 36.0–46.0)
Hemoglobin: 8.7 g/dL — ABNORMAL LOW (ref 12.0–15.0)

## 2021-03-06 MED ORDER — DARBEPOETIN ALFA 100 MCG/0.5ML IJ SOSY
100.0000 ug | PREFILLED_SYRINGE | Freq: Once | INTRAMUSCULAR | Status: AC
  Administered 2021-03-06: 100 ug via SUBCUTANEOUS
  Filled 2021-03-06: qty 0.5

## 2021-03-06 NOTE — Progress Notes (Signed)
Occupational Therapy Treatment Patient Details Name: [name] MRN: [id number] DOB: 01/15/1936 Today's Date: 03/06/2021    History of present illness Patient admitted with Acute on chronic anemia secondary to upper GI bleed and found to be COVID positive. PMH includes chronic systolic CHF, A. fib on Eliquis, GERD, CKD stage IIIb, HTN, HLD, COPD Gold stage I (2018). EGD 5/10 found erosive esophagiti.   OT comments  Pt is progressing well with OT goals. This session pt required supervision to min guard with all ADL tasks and functional mobility, due to safety. Pt reported that she has been having some dizziness with ambulation, however her BP, HR, and O2 sats are all stable. Pt is very motivated to get stronger and go back home, requesting further mobilization this session after a short rest break in the recliner. Pt worked on pursed lip breathing through all activities to assist with taking deeper breaths and slow down her breathing. OT will continue following to work towards higher levels of independence with ADL's and functional mobility.    Follow Up Recommendations  SNF    Equipment Recommendations  None recommended by OT    Recommendations for Other Services      Precautions / Restrictions Precautions Precautions: Fall Restrictions Weight Bearing Restrictions: No       Mobility Bed Mobility Overal bed mobility: Modified Independent Bed Mobility: Sit to Supine       Sit to supine: Modified independent (Device/Increase time);HOB elevated   General bed mobility comments: No assist needed. Up in recliner on entry    Transfers Overall transfer level: Needs assistance Equipment used: Rolling walker (2 wheeled) Transfers: Sit to/from Stand Sit to Stand: Min guard         General transfer comment: Min guard for sit<>stand X3    Balance Overall balance assessment: Mild deficits observed, not formally tested                                          ADL either performed or assessed with clinical judgement   ADL Overall ADL's : Needs assistance/impaired Eating/Feeding: Independent Eating/Feeding Details (indicate cue type and reason): No difficulties opening containers and feeding himself Grooming: Wash/dry hands;Standing;Min guard Grooming Details (indicate cue type and reason): Cueing to do pursed lip breathing and min guard for safety                 Toilet Transfer: Min guard;Ambulation;Regular Toilet;RW Toilet Transfer Details (indicate cue type and reason): Completed in bathroom, used grab bars when standing. Min guard for safety Toileting- Clothing Manipulation and Hygiene: Supervision/safety;Sitting/lateral lean;Sit to/from stand Toileting - Clothing Manipulation Details (indicate cue type and reason): Supervision for safety     Functional mobility during ADLs: Min guard;Rolling walker General ADL Comments: Pt required cueing for pursed lip breathing to slow her breath and take deeper breaths. Pt needed supervision to min guard with all functional mobility and ADL's due to safety and pt reporting that sometimes she feels dizzy.     Vision       Perception     Praxis      Cognition Arousal/Alertness: Awake/alert Behavior During Therapy: WFL for tasks assessed/performed Overall Cognitive Status: Within Functional Limits for tasks assessed  Exercises     Shoulder Instructions       General Comments VSS on Ra. Before activity HR 70 O2 96%, after activity HR 74, O2 98%    Pertinent Vitals/ Pain       Pain Assessment: No/denies pain  Home Living                                          Prior Functioning/Environment              Frequency  Min 2X/week        Progress Toward Goals  OT Goals(current goals can now be found in the care plan section)  Progress towards OT goals: Progressing toward goals  Acute Rehab OT  Goals Patient Stated Goal: to get stronger and walk around the room, go to rehab and then go home OT Goal Formulation: With patient Time For Goal Achievement: 03/16/21 Potential to Achieve Goals: Good ADL Goals Pt Will Perform Lower Body Dressing: with modified independence;sit to/from stand Pt Will Transfer to Toilet: with modified independence;ambulating;regular height toilet;grab bars Pt Will Perform Toileting - Clothing Manipulation and hygiene: with modified independence;sit to/from stand Pt/caregiver will Perform Home Exercise Program: Increased strength;Right Upper extremity;Left upper extremity Additional ADL Goal #1: Patient will stand at sink x 3 min to perform grooming task as evidence of improving activity tolerance  Plan Discharge plan remains appropriate    Co-evaluation                 AM-PAC OT "6 Clicks" Daily Activity     Outcome Measure   Help from another person eating meals?: None Help from another person taking care of personal grooming?: A Little Help from another person toileting, which includes using toliet, bedpan, or urinal?: A Little Help from another person bathing (including washing, rinsing, drying)?: A Little Help from another person to put on and taking off regular upper body clothing?: A Little Help from another person to put on and taking off regular lower body clothing?: A Little 6 Click Score: 19    End of Session Equipment Utilized During Treatment: Rolling walker  OT Visit Diagnosis: Muscle weakness (generalized) (M62.81)   Activity Tolerance Patient tolerated treatment well   Patient Left in bed;with call bell/phone within reach   Nurse Communication Mobility status        Time: 8841-6606 OT Time Calculation (min): 36 min  Charges: OT General Charges $OT Visit: 1 Visit OT Treatments $Self Care/Home Management : 23-37 mins  Monai Hindes H., OTR/L [place] 03/06/2021, 4:35 PM

## 2021-03-06 NOTE — Care Management Important Message (Signed)
Important Message  Patient Details  Name: Krista Mason MRN: [id number] Date of Birth: [birth year]   Medicare Important Message Given:  Yes     Shourya Macpherson [address]ine Circle 03/06/2021, 3:11 PM

## 2021-03-06 NOTE — Progress Notes (Addendum)
11:55am: CSW spoke with patient via phone to present her with bed offers - patient requested CSW contact her daughter Davy Pique to present her with offers for a decision.  CSW spoke with patient's daughter Davy Pique to present her with bed offers - Davy Pique to review facilities and make decision and will return call to [place].  10:40am: CSW spoke with Ebony Hail of [facility] who states she will review the patient but will not be accepted into the facility until she is out of the [place] window as she tested positive on 02/27/21.  Clinicals were sent in the [place] for review.  Madilyn Fireman, MSW, LCSW Transitions of Care  Clinical Social Worker II [phone]

## 2021-03-06 NOTE — Progress Notes (Signed)
PROGRESS NOTE    Krista Mason  YQI:[id number] DOB: 12/08/1935 DOA: 02/27/2021 PCP: Leighton Ruff, MD    Brief Narrative:  Krista Mason a 85 y.o.femalewith medical history significant ofchronic systolic CHF, A. fib on Eliquis, GERD, CKD stage IIIb, HTN, HLD, COPD Gold stage I(2018), presented with multiple complaints. Patient started to have significant postnasal drip and congestion since early last week, she developed a dry cough with low-grade fever. She self tested for COVID 02/22/21 which turned positive. She went to see her PCP at [facility], whoreferred her for infusion center. At the same time, for a week or so, she developed cramping-like abdominal pain, usually after she eats or drink.And she noticed her stool color has turned darker. Patient is not vaccinated for COVID-19.   ED Course:No significant hypoxia, blood pressure stable, chest x-ray no acute infiltrates. Hb9.3 compared to 10.9, 2 weeks ago.    Assessment & Plan:   Active Problems:   Melena   GI bleed  Acute on chronic anemia secondary to upper GI bleeding: EGD 5/10 Dr. Michail Sermon, patient was found to have severe distal ulcerated esophageal stricture s/p dilatation.   Baseline hemoglobin 10-11.  Presented with hemoglobin of 8.9-8-7.9-7.9-7.9 Received 1 dose of Feraheme 5/11.  Hemoglobin is stable since then.  Repeat hemoglobin 8.7 today. As per GI recommendation, continue to hold Eliquis for 2 weeks.  Will resume on 5/23. soft diet and outpatient follow-up. Currently on high-dose Protonix and Carafate. Patient receives Aranesp 100 mcg every month and is due today, will prescribe a dose today as she is not able to keep up outpatient appointment.  COVID infection: No significant pneumonia on x-ray.  No hypoxia no fever.  Patient was given 3days IV remdesivir because of hospitalizations and mild symptoms with chronic morbidities. Was diagnosed at home on 5/4.  Documented positive test on 5/9.   No more  isolation needed.  Chronic systolic heart failure: Euvolemic.  On torsemide.  CKD stage IIIb: Fairly stable at about her baseline.  Her baseline creatinine is about 2.  She follows up with nephrology as outpatient.  Essential hypertension: Stable on medications.  Paroxysmal A. fib: Rate controlled.  Eliquis on hold.  Tolerating Coreg and low-dose amiodarone.  Continue to work with PT OT.  Refer to SNF.  DVT prophylaxis: SCDs Start: 02/27/21 1835   Code Status: DNR Family Communication: None today. Disposition Plan: Status is: Inpatient  Remains inpatient appropriate because:IV treatments appropriate due to intensity of illness or inability to take PO and Inpatient level of care appropriate due to severity of illness   Dispo: The patient is from: Home              Anticipated d/c is to: Skilled nursing facility.              Patient currently is medically stable for discharge.   Difficult to place patient No  Consultants:   Gastroenterology  Procedures:   Upper GI endoscopy 5/10, severe distal ulcerated esophageal stricture.  Antimicrobials:   None   Subjective: Seen and examined.  No new events.  She wanted to explore whether she can get her dose of Aranesp while she is here.   Objective: Vitals:   03/05/21 0552 03/05/21 1457 03/05/21 2005 03/06/21 0411  BP: (!) 133/55 (!) 118/53 (!) 136/45 (!) 138/53  Pulse: 73 70 70 68  Resp: 18 17 16 18   Temp: 98.9 F (37.2 C) 98.9 F (37.2 C) 98.8 F (37.1 C) 98.4 F (36.9 C)  TempSrc: Oral Oral  Oral  SpO2: 93% 98% 98% 97%  Weight:      Height:        Intake/Output Summary (Last 24 hours) at 03/06/2021 1044 Last data filed at 03/06/2021 0410 Gross per 24 hour  Intake 300 ml  Output 1400 ml  Net -1100 ml   Filed Weights   02/27/21 1302 02/27/21 2015 02/28/21 1331  Weight: 68 kg 66.8 kg 66.8 kg    Examination:  General exam: Appears calm and comfortable at rest.  On room air. Respiratory system: Mostly  clear. Cardiovascular system: S1 & S2 heard, RRR.  Gastrointestinal system: Abdomen is nondistended, soft and nontender. No organomegaly or masses felt. Normal bowel sounds heard. Central nervous system: Alert and oriented. No focal neurological deficits. Extremities: Symmetric 5 x 5 power. Skin: No rashes, lesions or ulcers Psychiatry: Judgement and insight appear normal. Mood & affect normal.    Data Reviewed: I have personally reviewed following labs and imaging studies  CBC: Recent Labs  Lab 02/27/21 1353 02/27/21 2330 02/28/21 0015 03/01/21 0353 03/02/21 0808 03/06/21 0315  WBC 6.1  --  5.9 6.5 6.3  --   NEUTROABS 4.5  --   --   --  4.5  --   HGB 9.3* 8.4* 8.4* 7.9* 7.9* 8.7*  HCT 29.4* 26.1* 26.2* 24.6* 24.5* 27.1*  MCV 90.7  --  89.7 89.1 88.4  --   PLT 169  --  152 166 171  --    Basic Metabolic Panel: Recent Labs  Lab 02/27/21 1353 02/28/21 0015 03/01/21 0353  NA 134* 132* 133*  K 3.9 3.4* 3.5  CL 101 97* 100  CO2 25 24 24   GLUCOSE 170* 126* 106*  BUN 37* 35* 33*  CREATININE 1.96* 1.92* 2.01*  CALCIUM 9.2 8.7* 8.7*   GFR: Estimated Creatinine Clearance: 19.4 mL/min (A) (by C-G formula based on SCr of 2.01 mg/dL (H)). Liver Function Tests: Recent Labs  Lab 02/27/21 1353  AST 24  ALT 24  ALKPHOS 68  BILITOT 0.4  PROT 6.3*  ALBUMIN 3.1*   No results for input(s): LIPASE, AMYLASE in the last 168 hours. No results for input(s): AMMONIA in the last 168 hours. Coagulation Profile: Recent Labs  Lab 02/27/21 1849  INR 1.4*   Cardiac Enzymes: No results for input(s): CKTOTAL, CKMB, CKMBINDEX, TROPONINI in the last 168 hours. BNP (last 3 results) No results for input(s): PROBNP in the last 8760 hours. HbA1C: No results for input(s): HGBA1C in the last 72 hours. CBG: No results for input(s): GLUCAP in the last 168 hours. Lipid Profile: No results for input(s): CHOL, HDL, LDLCALC, TRIG, CHOLHDL, LDLDIRECT in the last 72 hours. Thyroid Function  Tests: No results for input(s): TSH, T4TOTAL, FREET4, T3FREE, THYROIDAB in the last 72 hours. Anemia Panel: No results for input(s): VITAMINB12, FOLATE, FERRITIN, TIBC, IRON, RETICCTPCT in the last 72 hours. Sepsis Labs: No results for input(s): PROCALCITON, LATICACIDVEN in the last 168 hours.  Recent Results (from the past 240 hour(s))  SARS CORONAVIRUS 2 (TAT 6-24 HRS) Nasopharyngeal Nasopharyngeal Swab     Status: Abnormal   Collection Time: 02/27/21  6:03 PM   Specimen: Nasopharyngeal Swab  Result Value Ref Range Status   SARS Coronavirus 2 POSITIVE (A) NEGATIVE Final    Comment: (NOTE) SARS-CoV-2 target nucleic acids are DETECTED.  The SARS-CoV-2 RNA is generally detectable in upper and lower respiratory specimens during the acute phase of infection. Positive results are indicative of the presence of SARS-CoV-2 RNA.  Clinical correlation with patient history and other diagnostic information is  necessary to determine patient infection status. Positive results do not rule out bacterial infection or co-infection with other viruses.  The expected result is Negative.  Fact Sheet for Patients: SugarRoll.be  Fact Sheet for Healthcare Providers: https://www.woods-mathews.com/  This test is not yet approved or cleared by the [place] FDA and  has been authorized for detection and/or diagnosis of SARS-CoV-2 by FDA under an Emergency Use Authorization (EUA). This EUA will remain  in effect (meaning this test can be used) for the duration of the COVID-19 declaration under Section 564(b)(1) of the Act, 21 U. S.C. section 360bbb-3(b)(1), unless the authorization is terminated or revoked sooner.   Performed at [facility], [place] [address] [place], [place] [address]          Radiology Studies: No results found.      Scheduled Meds: . amiodarone  100 mg Oral Q48H  . atorvastatin  20 mg Oral Daily  . benzonatate  100 mg  Oral TID  . carvedilol  12.5 mg Oral BID WC  . darbepoetin (ARANESP) injection - NON-DIALYSIS  100 mcg Subcutaneous Once  . feeding supplement  237 mL Oral BID BM  . hydrALAZINE  50 mg Oral BID  . pantoprazole  40 mg Oral BID  . sucralfate  1 g Oral TID WC & HS  . torsemide  20 mg Oral Daily   Continuous Infusions:    LOS: 7 days    Time spent: 30 minutes    Barb Merino, MD [facility] Pager [phone]

## 2021-03-07 ENCOUNTER — Inpatient Hospital Stay (HOSPITAL_COMMUNITY): Admission: RE | Admit: 2021-03-07 | Payer: Medicare HMO | Source: Ambulatory Visit

## 2021-03-07 DIAGNOSIS — K2991 Gastroduodenitis, unspecified, with bleeding: Secondary | ICD-10-CM | POA: Diagnosis not present

## 2021-03-07 NOTE — Discharge Summary (Signed)
Physician Discharge Summary  Krista Mason UJW:[id number] DOB: 18-Jul-1936 DOA: 02/27/2021  PCP: Leighton Ruff, MD  Admit date: 02/27/2021 Discharge date: 03/07/2021  Admitted From: Home Disposition: Skilled nursing facility  Recommendations for Outpatient Follow-up:  1. Follow up with PCP in 1-2 weeks after discharge. 2. Please obtain BMP/CBC in one week 3. Can use over-the-counter cough medications.  Home Health: Not applicable.  Going to SNF. Equipment/Devices: None.  Discharge Condition: Stable CODE STATUS: DNR Diet recommendation: Low-salt diet  Discharge summary: Krista Mason a 85 y.o.femalewith medical history significant ofchronic systolic CHF, A. fib on Eliquis, GERD, CKD stage IIIb, HTN, HLD, COPD Gold stage I(2018), presented with multiple complaints. Patient started to have significant postnasal drip and congestion since early last week, she developed a dry cough with low-grade fever. She self tested for COVID 02/22/21 that was positive. She went to see her PCP at [facility], whoreferred her for infusion center. At the same time, for a week or so, she developed cramping-like abdominal pain, usually after she eats or drink as well noticed her stool color has turned darker. Patient is not vaccinated for COVID-19.  ED Course:No significant hypoxia, blood pressure stable, chest x-ray no acute infiltrates. Hb9.3 compared to 10.9, 2 weeks ago.    Assessment & Plan of care:    # Acute on chronic anemia secondary to upper GI bleeding: EGD 5/10 Dr. Michail Sermon, patient was found to have severe distal ulcerated esophageal stricture s/p dilatation.   Baseline hemoglobin 10-11.  Presented with hemoglobin of 8.9-8-7.9-7.9-7.9-8.7 now. Received 1 dose of Feraheme 5/11 and monthly dose of Aranesp 5/16.  Hemoglobin is stable since then.  Repeat hemoglobin 8.7  As per GI recommendation, continue to hold Eliquis for 2 weeks.  Will resume on 5/23. soft diet and outpatient  follow-up. Currently on high-dose Protonix and Carafate.  # COVID infection: No significant pneumonia on x-ray.  No hypoxia no fever.  Patient was given 3days IV remdesivir because of hospitalizations and mild symptoms with chronic morbidities. Was diagnosed at home on 5/4.  No more isolation needed.  # Chronic systolic heart failure: Euvolemic.  On torsemide.  # CKD stage IIIb: Fairly stable at about her baseline.  Her baseline creatinine is about 2.  She follows up with nephrology as outpatient.  # Essential hypertension: Stable on medications.  # Paroxysmal A. fib: Rate controlled.  Eliquis on hold.  Tolerating Coreg and low-dose amiodarone.  Continue to work with PT OT.    Transfer to skilled nursing rehab when bed available.  Patient is stable.   Discharge Diagnoses:  Active Problems:   Melena   GI bleed    Discharge Instructions  Discharge Instructions    Diet - low sodium heart healthy   Complete by: As directed    Discharge instructions   Complete by: As directed    Resume taking Eliquis on 5/23   Increase activity slowly   Complete by: As directed      Allergies as of 03/07/2021      Reactions   Epoetin Alfa Nausea Only, Other (See Comments)   Procrit and Epogen: "Chills, low grade fever, body aches, fatigue, nausea for 3 days"   Penicillins Itching, Rash   Amoxicillin ok- IM pen is what gives reaction Did it involve swelling of the face/tongue/throat, SOB, or low BP? No Did it involve sudden or severe rash/hives, skin peeling, or any reaction on the inside of your mouth or nose? No Did you need to seek medical attention at  a hospital or doctor's office? No When did it last happen?10 + year If all above answers are "NO", may proceed with cephalosporin use. Other reaction(s): rash, itching   Retacrit [epoetin (alfa)] Other (See Comments)   Chills, low grade fever, body aches, fatigue, nausea for 3 days.   Crestor [rosuvastatin] Nausea Only    Hydrocodone Itching, Other (See Comments)   And the patient "got mean"   Influenza Virus Vaccine Split Swelling, Other (See Comments)   Local swelling and fever   Tape Other (See Comments)   The skin tears easily   Cefaclor Itching   Crestor [rosuvastatin Calcium] Nausea Only   Influenza Vaccines Swelling, Other (See Comments)   Swelling and redness at injection site, fever   Lidocaine Hcl Palpitations, Other (See Comments)   Heart racing if Lidocaine is with EPI   Nsaids    Pt states she is not taking because of kidney function   Percocet [oxycodone-acetaminophen] Nausea And Vomiting   Prednisone Other (See Comments)   Dizziness, spiked blood sugar      Medication List    STOP taking these medications   Eliquis 2.5 MG Tabs tablet Generic drug: apixaban     TAKE these medications   acetaminophen 500 MG tablet Commonly known as: TYLENOL Take 1,000 mg by mouth every 6 (six) hours as needed for moderate pain, headache or mild pain.   amiodarone 100 MG tablet Commonly known as: PACERONE Take 100 mg by mouth every other day. What changed: Another medication with the same name was removed. Continue taking this medication, and follow the directions you see here.   atorvastatin 20 MG tablet Commonly known as: LIPITOR TAKE 1 TABLET BY MOUTH EVERY DAY What changed: when to take this   carvedilol 12.5 MG tablet Commonly known as: COREG Take 1 tablet (12.5 mg total) by mouth 2 (two) times daily with a meal.   diphenhydrAMINE 25 mg capsule Commonly known as: BENADRYL Take 25 mg by mouth every 6 (six) hours as needed for allergies.   feeding supplement Liqd Take 237 mLs by mouth 2 (two) times daily between meals. What changed: when to take this   hydrALAZINE 50 MG tablet Commonly known as: APRESOLINE Take 50 mg by mouth 2 (two) times daily.   imiquimod 5 % cream Commonly known as: ALDARA Apply 1 application topically as needed (as directed, for skin cancer sites).    ondansetron 4 MG tablet Commonly known as: ZOFRAN Take 1 tablet (4 mg total) by mouth every 6 (six) hours as needed for nausea or vomiting.   pantoprazole 40 MG tablet Commonly known as: Protonix Take 1 tablet (40 mg total) by mouth 2 (two) times daily before a meal.   polyethylene glycol 17 g packet Commonly known as: MIRALAX / GLYCOLAX Take 17 g by mouth daily as needed for mild constipation (MIX AS DIRECTED AND DRINK).   SYSTANE OP Place 1 drop into both eyes 3 (three) times daily as needed (dryness).   torsemide 20 MG tablet Commonly known as: DEMADEX Take 20 mg by mouth in the morning.   vitamin B-12 1000 MCG tablet Commonly known as: CYANOCOBALAMIN Take 1 tablet (1,000 mcg total) by mouth daily.   Vitamin D3 50 MCG (2000 UT) Tabs Take 2,000 Units by mouth daily.       Follow-up Information    Leighton Ruff, MD Follow up in 2 week(s).   Specialty: Family Medicine Contact information: [place] [address] [phone]  Allergies  Allergen Reactions  . Epoetin Alfa Nausea Only and Other (See Comments)    Procrit and Epogen: "Chills, low grade fever, body aches, fatigue, nausea for 3 days"   . Penicillins Itching and Rash    Amoxicillin ok- IM pen is what gives reaction  Did it involve swelling of the face/tongue/throat, SOB, or low BP? No Did it involve sudden or severe rash/hives, skin peeling, or any reaction on the inside of your mouth or nose? No Did you need to seek medical attention at a hospital or doctor's office? No When did it last happen?10 + year If all above answers are "NO", may proceed with cephalosporin use.  Other reaction(s): rash, itching  . Retacrit [Epoetin (Alfa)] Other (See Comments)    Chills, low grade fever, body aches, fatigue, nausea for 3 days.  . Crestor [Rosuvastatin] Nausea Only  . Hydrocodone Itching and Other (See Comments)    And the patient "got mean"  . Influenza Virus  Vaccine Split Swelling and Other (See Comments)    Local swelling and fever  . Tape Other (See Comments)    The skin tears easily  . Cefaclor Itching  . Crestor [Rosuvastatin Calcium] Nausea Only  . Influenza Vaccines Swelling and Other (See Comments)    Swelling and redness at injection site, fever  . Lidocaine Hcl Palpitations and Other (See Comments)    Heart racing if Lidocaine is with EPI  . Nsaids     Pt states she is not taking because of kidney function   . Percocet [Oxycodone-Acetaminophen] Nausea And Vomiting  . Prednisone Other (See Comments)    Dizziness, spiked blood sugar     Consultations:  Gastroenterology   Procedures/Studies: CT ABDOMEN PELVIS WO CONTRAST  Result Date: 02/27/2021 CLINICAL DATA:  Nonlocalized abdominal pain EXAM: CT ABDOMEN AND PELVIS WITHOUT CONTRAST TECHNIQUE: Multidetector CT imaging of the abdomen and pelvis was performed following the standard protocol without IV contrast. COMPARISON:  CT 11/29/2020, radiograph 11/29/2020 FINDINGS: Lower chest: Lung bases demonstrate cardiomegaly with partially visualized intracardiac pacing leads. Trace pericardial effusion. No acute consolidation or effusion. Small hiatal hernia. Right upper quadrant 16 mm subcutaneous mass. Hepatobiliary: No focal hepatic abnormality. No biliary dilatation. Punctate stones in the gallbladder. Pancreas: Unremarkable. No pancreatic ductal dilatation or surrounding inflammatory changes. Spleen: Multiple granuloma Adrenals/Urinary Tract: Adrenal glands are normal. Kidneys show no hydronephrosis. Multiple low-attenuation renal lesions, probably cysts but further characterisation limited without contrast. Urinary bladder is unremarkable. Stomach/Bowel: Stomach nonenlarged. No dilated small bowel. No bowel wall thickening. Sigmoid colon diverticular disease without acute inflammatory process. Vascular/Lymphatic: Advanced aortic atherosclerosis. Mild aneurysmal dilatation of the distal  infrarenal abdominal aorta measuring up to 3.3 cm. No suspicious nodes. Reproductive: Uterus and bilateral adnexa are unremarkable. Other: Negative for free air or free fluid. Musculoskeletal: Chronic superior endplate deformity at L1. No acute osseous abnormality. IMPRESSION: 1. No CT evidence for acute intra-abdominal or pelvic abnormality. 2. Cardiomegaly with trace pericardial effusion. 3. Sigmoid colon diverticular disease without acute inflammatory process 4. Infrarenal abdominal aortic aneurysm up to 3.3 cm. Recommend follow-up ultrasound every 3 years. This recommendation follows ACR consensus guidelines: White Paper of the ACR Incidental Findings Committee II on Vascular Findings. J Am Coll Radiol 2013; 10:789-794. 5. Right upper quadrant 16 mm subcutaneous mass which may be correlated with physical exam 6. Probable small gallstones Electronically Signed   By: Donavan Foil M.D.   On: 02/27/2021 22:13   DG Chest Portable 1 View  Result Date: 02/27/2021 CLINICAL DATA:  Short of breath.  COVID-19 positive. EXAM: PORTABLE CHEST 1 VIEW COMPARISON:  08/18/2019. FINDINGS: Cardiac silhouette mildly enlarged. Stable left anterior chest wall biventricular cardioverter-defibrillator. Dense calcification along the thoracic aorta and in the AP window, stable. No mediastinal or hilar masses. Lungs hyperexpanded, but clear. No pleural effusion or pneumothorax. Skeletal structures are grossly intact. IMPRESSION: 1. No acute cardiopulmonary disease. Electronically Signed   By: Lajean Manes M.D.   On: 02/27/2021 14:05    (Echo, Carotid, EGD, Colonoscopy, ERCP)    Subjective: Patient seen and examined.  She wishes she could walk and go home.  No other overnight events.  Afebrile.  Denies any sputum production.  Feels somehow strength is better than before.  Was able to eat soft diet without problem.   Discharge Exam: Vitals:   03/07/21 0440 03/07/21 1350  BP: (!) 142/43 (!) 137/40  Pulse: 69 70  Resp: 17 15   Temp: 98.7 F (37.1 C) 98.3 F (36.8 C)  SpO2: 98% 96%   Vitals:   03/06/21 1625 03/06/21 2044 03/07/21 0440 03/07/21 1350  BP: (!) 150/52 (!) 148/41 (!) 142/43 (!) 137/40  Pulse: 68 69 69 70  Resp: 18 16 17 15   Temp: 97.8 F (36.6 C) 98.7 F (37.1 C) 98.7 F (37.1 C) 98.3 F (36.8 C)  TempSrc: Oral Oral Oral   SpO2: 99% 97% 98% 96%  Weight:      Height:        General: Pt is alert, awake, not in acute distress Cardiovascular: RRR, S1/S2 +, no rubs, no gallops Respiratory: CTA bilaterally, no wheezing, no rhonchi Abdominal: Soft, NT, ND, bowel sounds + Extremities: no edema, no cyanosis    The results of significant diagnostics from this hospitalization (including imaging, microbiology, ancillary and laboratory) are listed below for reference.     Microbiology: Recent Results (from the past 240 hour(s))  SARS CORONAVIRUS 2 (TAT 6-24 HRS) Nasopharyngeal Nasopharyngeal Swab     Status: Abnormal   Collection Time: 02/27/21  6:03 PM   Specimen: Nasopharyngeal Swab  Result Value Ref Range Status   SARS Coronavirus 2 POSITIVE (A) NEGATIVE Final    Comment: (NOTE) SARS-CoV-2 target nucleic acids are DETECTED.  The SARS-CoV-2 RNA is generally detectable in upper and lower respiratory specimens during the acute phase of infection. Positive results are indicative of the presence of SARS-CoV-2 RNA. Clinical correlation with patient history and other diagnostic information is  necessary to determine patient infection status. Positive results do not rule out bacterial infection or co-infection with other viruses.  The expected result is Negative.  Fact Sheet for Patients: SugarRoll.be  Fact Sheet for Healthcare Providers: https://www.woods-mathews.com/  This test is not yet approved or cleared by the [place] FDA and  has been authorized for detection and/or diagnosis of SARS-CoV-2 by FDA under an Emergency Use Authorization  (EUA). This EUA will remain  in effect (meaning this test can be used) for the duration of the COVID-19 declaration under Section 564(b)(1) of the Act, 21 U. S.C. section 360bbb-3(b)(1), unless the authorization is terminated or revoked sooner.   Performed at [facility], [place] [address], [place], [place] [address]      Labs: BNP (last 3 results) No results for input(s): BNP in the last 8760 hours. Basic Metabolic Panel: Recent Labs  Lab 03/01/21 0353  NA 133*  K 3.5  CL 100  CO2 24  GLUCOSE 106*  BUN 33*  CREATININE 2.01*  CALCIUM 8.7*   Liver Function Tests:  No results for input(s): AST, ALT, ALKPHOS, BILITOT, PROT, ALBUMIN in the last 168 hours. No results for input(s): LIPASE, AMYLASE in the last 168 hours. No results for input(s): AMMONIA in the last 168 hours. CBC: Recent Labs  Lab 03/01/21 0353 03/02/21 0808 03/06/21 0315  WBC 6.5 6.3  --   NEUTROABS  --  4.5  --   HGB 7.9* 7.9* 8.7*  HCT 24.6* 24.5* 27.1*  MCV 89.1 88.4  --   PLT 166 171  --    Cardiac Enzymes: No results for input(s): CKTOTAL, CKMB, CKMBINDEX, TROPONINI in the last 168 hours. BNP: Invalid input(s): POCBNP CBG: No results for input(s): GLUCAP in the last 168 hours. D-Dimer No results for input(s): DDIMER in the last 72 hours. Hgb A1c No results for input(s): HGBA1C in the last 72 hours. Lipid Profile No results for input(s): CHOL, HDL, LDLCALC, TRIG, CHOLHDL, LDLDIRECT in the last 72 hours. Thyroid function studies No results for input(s): TSH, T4TOTAL, T3FREE, THYROIDAB in the last 72 hours.  Invalid input(s): FREET3 Anemia work up No results for input(s): VITAMINB12, FOLATE, FERRITIN, TIBC, IRON, RETICCTPCT in the last 72 hours. Urinalysis    Component Value Date/Time   COLORURINE STRAW (A) 01/28/2018 1353   APPEARANCEUR CLEAR 01/28/2018 1353   LABSPEC 1.006 01/28/2018 1353   PHURINE 6.0 01/28/2018 1353   GLUCOSEU NEGATIVE 01/28/2018 1353   HGBUR NEGATIVE  01/28/2018 1353   BILIRUBINUR NEGATIVE 01/28/2018 1353   KETONESUR NEGATIVE 01/28/2018 1353   PROTEINUR NEGATIVE 01/28/2018 1353   UROBILINOGEN 0.2 04/15/2012 1451   NITRITE NEGATIVE 01/28/2018 1353   LEUKOCYTESUR NEGATIVE 01/28/2018 1353   Sepsis Labs Invalid input(s): PROCALCITONIN,  WBC,  LACTICIDVEN Microbiology Recent Results (from the past 240 hour(s))  SARS CORONAVIRUS 2 (TAT 6-24 HRS) Nasopharyngeal Nasopharyngeal Swab     Status: Abnormal   Collection Time: 02/27/21  6:03 PM   Specimen: Nasopharyngeal Swab  Result Value Ref Range Status   SARS Coronavirus 2 POSITIVE (A) NEGATIVE Final    Comment: (NOTE) SARS-CoV-2 target nucleic acids are DETECTED.  The SARS-CoV-2 RNA is generally detectable in upper and lower respiratory specimens during the acute phase of infection. Positive results are indicative of the presence of SARS-CoV-2 RNA. Clinical correlation with patient history and other diagnostic information is  necessary to determine patient infection status. Positive results do not rule out bacterial infection or co-infection with other viruses.  The expected result is Negative.  Fact Sheet for Patients: SugarRoll.be  Fact Sheet for Healthcare Providers: https://www.woods-mathews.com/  This test is not yet approved or cleared by the [place] FDA and  has been authorized for detection and/or diagnosis of SARS-CoV-2 by FDA under an Emergency Use Authorization (EUA). This EUA will remain  in effect (meaning this test can be used) for the duration of the COVID-19 declaration under Section 564(b)(1) of the Act, 21 U. S.C. section 360bbb-3(b)(1), unless the authorization is terminated or revoked sooner.   Performed at [facility], [place] [address] [place], [place] [address]      Time coordinating discharge:  35 minutes  SIGNED:   Barb Merino, MD  [facility] 03/07/2021, 2:38 PM

## 2021-03-07 NOTE — Progress Notes (Signed)
CSW was notified by patient's daughter yesterday (5/16) afternoon that she has selected [facility] for SNF placement. CSW notified Helene Kelp at facility and she will initiate insurance authorization - patient can discharge once that authorization is approved and she is 10 days post positive COVID test (5/19).  Madilyn Fireman, MSW, LCSW Transitions of Care  Clinical Social Worker II [phone]

## 2021-03-07 NOTE — Plan of Care (Signed)

## 2021-03-08 DIAGNOSIS — U071 COVID-19: Secondary | ICD-10-CM | POA: Diagnosis not present

## 2021-03-08 DIAGNOSIS — K2991 Gastroduodenitis, unspecified, with bleeding: Secondary | ICD-10-CM | POA: Diagnosis not present

## 2021-03-08 DIAGNOSIS — K921 Melena: Secondary | ICD-10-CM | POA: Diagnosis not present

## 2021-03-08 NOTE — Progress Notes (Signed)
Physical Therapy Treatment Patient Details Name: Krista Mason MRN: [id number] DOB: 1935-12-07 Today's Date: 03/08/2021    History of Present Illness Patient admitted with Acute on chronic anemia secondary to upper GI bleed and found to be COVID positive. PMH includes chronic systolic CHF, A. fib on Eliquis, GERD, CKD stage IIIb, HTN, HLD, COPD Gold stage I (2018). EGD 5/10 found erosive esophagiti.    PT Comments    Pt reclined in chair on arrival.  Pt progressed to standing and reports dizziness, has patient sit and obtained BP in sitting: 109/57, in standing: 105/49, standing after 3 min: 121/101.  Educated to slow pace when changing position and not rush.  Continue to recommend rehab in a post acute setting.   HEP issued this session and informed nursing to ambulate patient 3x daily to improve strength even if for short distance in room.    Follow Up Recommendations  SNF;Supervision for mobility/OOB     Equipment Recommendations  None recommended by PT    Recommendations for Other Services       Precautions / Restrictions Precautions Precautions: Fall Restrictions Weight Bearing Restrictions: No    Mobility  Bed Mobility               General bed mobility comments: Seated in recliner on arrival.    Transfers Overall transfer level: Needs assistance Equipment used: Rolling walker (2 wheeled) Transfers: Sit to/from Stand Sit to Stand: Min guard         General transfer comment: Cues for hand placement and to keep RW close when sitting vs. leaving to the side of her.  Ambulation/Gait Ambulation/Gait assistance: Min guard Gait Distance (Feet): 45 Feet Assistive device: Rolling walker (2 wheeled) Gait Pattern/deviations: Trunk flexed;Step-through pattern;Decreased stride length     General Gait Details: Cues for upper trunk control and RW safety.  SPo2 100% on RA with mild DOE.   Stairs             Wheelchair Mobility    Modified Rankin (Stroke  Patients Only)       Balance Overall balance assessment: Mild deficits observed, not formally tested                                          Cognition Arousal/Alertness: Awake/alert Behavior During Therapy: WFL for tasks assessed/performed Overall Cognitive Status: Within Functional Limits for tasks assessed                                        Exercises General Exercises - Lower Extremity Kyllonen Arc Quad: AROM;Both;10 reps;Seated Heel Slides: AROM;Both;10 reps;Supine Hip ABduction/ADduction: AROM;Both;10 reps;Supine Straight Leg Raises: AROM;Both;10 reps;Supine Hip Flexion/Marching: AROM;Both;10 reps;Seated Other Exercises Other Exercises: B shoulder flexion x 10 reps seated.    General Comments        Pertinent Vitals/Pain Pain Assessment: 0-10 Pain Score: 5  Pain Location: R shoulder pain with movement. Pain Descriptors / Indicators: Stabbing Pain Intervention(s): Monitored during session;Repositioned    Home Living                      Prior Function            PT Goals (current goals can now be found in the care plan section) Acute Rehab PT Goals Patient  Stated Goal: to get stronger and walk around the room, go to rehab and then go home Potential to Achieve Goals: Good Progress towards PT goals: Progressing toward goals    Frequency    Min 2X/week      PT Plan Current plan remains appropriate    Co-evaluation              AM-PAC PT "6 Clicks" Mobility   Outcome Measure  Help needed turning from your back to your side while in a flat bed without using bedrails?: None Help needed moving from lying on your back to sitting on the side of a flat bed without using bedrails?: None Help needed moving to and from a bed to a chair (including a wheelchair)?: A Little Help needed standing up from a chair using your arms (e.g., wheelchair or bedside chair)?: A Little Help needed to walk in hospital room?: A  Little Help needed climbing 3-5 steps with a railing? : A Little 6 Click Score: 20    End of Session Equipment Utilized During Treatment: Gait belt Activity Tolerance: Patient limited by fatigue Patient left: with call bell/phone within reach;in chair Nurse Communication: Mobility status PT Visit Diagnosis: Difficulty in walking, not elsewhere classified (R26.2);Muscle weakness (generalized) (M62.81);Unsteadiness on feet (R26.81)     Time: 6808-8110 PT Time Calculation (min) (ACUTE ONLY): 36 min  Charges:  $Gait Training: 8-22 mins $Therapeutic Exercise: 8-22 mins                     Krista Mason , PTA Acute Rehabilitation Services Pager [phone] Office [place] 03/08/2021, 3:46 PM

## 2021-03-08 NOTE — Progress Notes (Signed)
Krista Mason  GGE:[id number] DOB: 21-Jan-1936 DOA: 02/27/2021 PCP: Leighton Ruff, MD    Brief Narrative:  85 year old with a history of chronic systolic CHF, atrial fibrillation on Eliquis, GERD, CKD stage IIIb, HTN, HLD, and COPD who was admitted with crampy abdominal pain and dark stools.  She had also been diagnosed with COVID in the outpatient setting as per a home test 02/22/2021.  At the time of her admission she was found to be experiencing a GI bleed.  Consultants:  Gastroenterology  Code Status: FULL CODE  Antimicrobials:  None  DVT prophylaxis: SCDs  Subjective: Afebrile.  Vital signs stable.  Awaiting SNF discharge 5/19.  In good spirits.  No complaints.  Anxious to get to a rehab facility to begin therapy.  Assessment & Plan:  Severe distal ulcerative esophageal stricture Noted on EGD 5/10 -status post dilatation -clinically stable at this time  GI bleed -anemia of acute blood loss Baseline hemoglobin approximately 10.5 -hemoglobin nadir 7.9 -dosed with Feraheme 5/11 -holding Eliquis for 2 weeks with resumption 5/23 -continue high-dose Protonix and Carafate -dosed with her usual dose of Aranesp during this admission  Mildly symptomatic COVID infection CXR without significant infiltrate -afebrile with no hypoxia -dosed with 3-day course of Remdesivir given high risk status -clinically stable -Home test +5/4 but documented positive test in hospital 5/9 -does not require isolation at our facility but SNF will not accept her until 4/65  Chronic systolic CHF Euvolemic on usual torsemide dose  CKD stage IIIb Baseline creatinine approximately 2 -stable at this time  HTN Blood pressure presently well controlled  Paroxysmal atrial fib Rate controlled - Eliquis on hold until 5/23   Family Communication:  Status is: Inpatient  Remains inpatient appropriate because:Unsafe d/c plan   Dispo: The patient is from: Home              Anticipated d/c is to: SNF               Patient currently is medically stable to d/c.   Difficult to place patient No    Objective: Blood pressure (!) 142/63, pulse 68, temperature 97.7 F (36.5 C), temperature source Oral, resp. rate 15, height 5\' 5"  (1.651 m), weight 66.8 kg, SpO2 97 %.  Intake/Output Summary (Last 24 hours) at 03/08/2021 1015 Last data filed at 03/08/2021 0900 Gross per 24 hour  Intake 840 ml  Output --  Net 840 ml   Filed Weights   02/27/21 1302 02/27/21 2015 02/28/21 1331  Weight: 68 kg 66.8 kg 66.8 kg    Examination: General: No acute respiratory distress Lungs: Clear to auscultation bilaterally without wheezes or crackles Cardiovascular: Regular rate and rhythm without murmur gallop or rub normal S1 and S2 Abdomen: Nontender, nondistended, soft, bowel sounds positive, no rebound, no ascites, no appreciable mass Extremities: No significant cyanosis, clubbing, or edema bilateral lower extremities  CBC: Recent Labs  Lab 03/02/21 0808 03/06/21 0315  WBC 6.3  --   NEUTROABS 4.5  --   HGB 7.9* 8.7*  HCT 24.5* 27.1*  MCV 88.4  --   PLT 171  --     HbA1C: Hgb A1c MFr Bld  Date/Time Value Ref Range Status  06/11/2019 12:06 PM 6.9 (H) 4.8 - 5.6 % Final    Comment:    (NOTE) Pre diabetes:          5.7%-6.4% Diabetes:              >6.4% Glycemic control for   <7.0%  adults with diabetes   11/05/2016 08:09 AM 6.2 (H) 4.8 - 5.6 % Final    Comment:    (NOTE)         Pre-diabetes: 5.7 - 6.4         Diabetes: >6.4         Glycemic control for adults with diabetes: <7.0     Recent Results (from the past 240 hour(s))  SARS CORONAVIRUS 2 (TAT 6-24 HRS) Nasopharyngeal Nasopharyngeal Swab     Status: Abnormal   Collection Time: 02/27/21  6:03 PM   Specimen: Nasopharyngeal Swab  Result Value Ref Range Status   SARS Coronavirus 2 POSITIVE (A) NEGATIVE Final    Comment: (NOTE) SARS-CoV-2 target nucleic acids are DETECTED.  The SARS-CoV-2 RNA is generally detectable in upper and  lower respiratory specimens during the acute phase of infection. Positive results are indicative of the presence of SARS-CoV-2 RNA. Clinical correlation with patient history and other diagnostic information is  necessary to determine patient infection status. Positive results do not rule out bacterial infection or co-infection with other viruses.  The expected result is Negative.  Fact Sheet for Patients: SugarRoll.be  Fact Sheet for Healthcare Providers: https://www.woods-mathews.com/  This test is not yet approved or cleared by the [place] FDA and  has been authorized for detection and/or diagnosis of SARS-CoV-2 by FDA under an Emergency Use Authorization (EUA). This EUA will remain  in effect (meaning this test can be used) for the duration of the COVID-19 declaration under Section 564(b)(1) of the Act, 21 U. S.C. section 360bbb-3(b)(1), unless the authorization is terminated or revoked sooner.   Performed at [facility], [place] [address] [place], [place] [address]      Scheduled Meds: . amiodarone  100 mg Oral Q48H  . atorvastatin  20 mg Oral Daily  . benzonatate  100 mg Oral TID  . carvedilol  12.5 mg Oral BID WC  . feeding supplement  237 mL Oral BID BM  . hydrALAZINE  50 mg Oral BID  . pantoprazole  40 mg Oral BID  . sucralfate  1 g Oral TID WC & HS  . torsemide  20 mg Oral Daily     LOS: 9 days   Cherene Altes, MD [facility] Office  [phone] Pager - Text Page per Amion  If 7PM-7AM, please contact night-coverage per Amion 03/08/2021, 10:15 AM

## 2021-03-08 NOTE — TOC Progression Note (Signed)
Transition of Care [facility]) - Progression Note    Patient Details  Name: Krista Mason MRN: [id number] Date of Birth: 1935/12/22  Transition of Care [facility]) CM/SW [place], [place] Phone Number: 03/08/2021, 4:37 PM  Clinical Narrative:     Pts insurance Josem Kaufmann is still pending per admission coordinator at [facility].   TOC will continue to follow.  Expected Discharge Plan: [place] Barriers to Discharge: Continued Medical Work up,SNF Covid  Expected Discharge Plan and Services Expected Discharge Plan: [place] arrangements for the past 2 months: [place]                                       Social Determinants of Health (SDOH) Interventions    Readmission Risk Interventions No flowsheet data found.  Emeterio Reeve, Latanya Presser, [place] Social Worker [phone]

## 2021-03-08 NOTE — Plan of Care (Signed)

## 2021-03-09 DIAGNOSIS — K2991 Gastroduodenitis, unspecified, with bleeding: Secondary | ICD-10-CM | POA: Diagnosis not present

## 2021-03-09 DIAGNOSIS — K921 Melena: Secondary | ICD-10-CM | POA: Diagnosis not present

## 2021-03-09 NOTE — Discharge Summary (Signed)
Physician Discharge Summary  Krista Mason JQB:[id number] DOB: 1935/12/03 DOA: 02/27/2021  PCP: Leighton Ruff, MD  Admit date: 02/27/2021 Discharge date: 03/09/2021  Admitted From: Home Disposition: Skilled nursing facility  Recommendations for Outpatient Follow-up:  1. Follow up with PCP in 1-2 weeks after discharge. 2. Please obtain BMP/CBC in one week 3. Can use over-the-counter cough medications. 4. Resume Eliquis 5/23  Home Health: Not applicable.  Going to SNF. Equipment/Devices: None.  Discharge Condition: Stable CODE STATUS: DNR / NO CODE BLUE  Diet recommendation: Low-salt diet  Discharge summary: 85 year old with a history of chronic systolic CHF, atrial fibrillation on Eliquis, GERD, CKD stage IIIb, HTN, HLD, and COPD who was admitted with crampy abdominal pain and dark stools.  She had also been diagnosed with COVID in the outpatient setting as per a home test 02/22/2021.  At the time of her admission she was found to be experiencing a GI bleed.  Assessment & Plan of care:    Acute on chronic anemia secondary to upper GI bleeding: EGD 5/10 Dr. Michail Sermon, patient was found to have severe distal ulcerated esophageal stricture s/p dilatation.   Baseline hemoglobin 10-11.  Presented with hemoglobin of 8.9-8-7.9-7.9-7.9-8.7 now. Received 1 dose of Feraheme 5/11 and monthly dose of Aranesp 5/16.  Hemoglobin is stable since then.  Repeat hemoglobin 8.7  As per GI recommendation, continue to hold Eliquis for 2 weeks.  Will resume on 5/23. soft diet and outpatient follow-up. Currently on high-dose Protonix and Carafate.  COVID infection: No significant infiltrate on x-ray.  No hypoxia no fever.  Patient was given 3days IV remdesivir because of high risk factos and mild symptoms with chronic morbidities. Was diagnosed at home on 5/4.  No more isolation needed.   Chronic systolic heart failure: Euvolemic.  On torsemide.  CKD stage IIIb: Fairly stable at about her baseline.   Her baseline creatinine is about 2.  She follows up with Nephrology as outpatient.  Essential hypertension: Stable on medications.  Paroxysmal A. fib: Rate controlled.  Eliquis on hold as noted above.  Tolerating Coreg and low-dose amiodarone.  Continue to work with PT OT.    Transfer to skilled nursing rehab when bed available.  Patient is stable.   Discharge Diagnoses:  Active Problems:   Melena   GI bleed    Discharge Instructions  Discharge Instructions    Diet - low sodium heart healthy   Complete by: As directed    Discharge instructions   Complete by: As directed    Resume taking Eliquis on 5/23   Increase activity slowly   Complete by: As directed      Allergies as of 03/09/2021      Reactions   Epoetin Alfa Nausea Only, Other (See Comments)   Procrit and Epogen: "Chills, low grade fever, body aches, fatigue, nausea for 3 days"   Penicillins Itching, Rash   Amoxicillin ok- IM pen is what gives reaction Did it involve swelling of the face/tongue/throat, SOB, or low BP? No Did it involve sudden or severe rash/hives, skin peeling, or any reaction on the inside of your mouth or nose? No Did you need to seek medical attention at a hospital or doctor's office? No When did it last happen?10 + year If all above answers are "NO", may proceed with cephalosporin use. Other reaction(s): rash, itching   Retacrit [epoetin (alfa)] Other (See Comments)   Chills, low grade fever, body aches, fatigue, nausea for 3 days.   Crestor [rosuvastatin] Nausea Only  Hydrocodone Itching, Other (See Comments)   And the patient "got mean"   Influenza Virus Vaccine Split Swelling, Other (See Comments)   Local swelling and fever   Tape Other (See Comments)   The skin tears easily   Cefaclor Itching   Crestor [rosuvastatin Calcium] Nausea Only   Influenza Vaccines Swelling, Other (See Comments)   Swelling and redness at injection site, fever   Lidocaine Hcl Palpitations, Other  (See Comments)   Heart racing if Lidocaine is with EPI   Nsaids    Pt states she is not taking because of kidney function   Percocet [oxycodone-acetaminophen] Nausea And Vomiting   Prednisone Other (See Comments)   Dizziness, spiked blood sugar      Medication List    STOP taking these medications   Eliquis 2.5 MG Tabs tablet Generic drug: apixaban     TAKE these medications   acetaminophen 500 MG tablet Commonly known as: TYLENOL Take 1,000 mg by mouth every 6 (six) hours as needed for moderate pain, headache or mild pain.   amiodarone 100 MG tablet Commonly known as: PACERONE Take 100 mg by mouth every other day. What changed: Another medication with the same name was removed. Continue taking this medication, and follow the directions you see here.   atorvastatin 20 MG tablet Commonly known as: LIPITOR Take 1 tablet (20 mg total) by mouth at bedtime.   carvedilol 12.5 MG tablet Commonly known as: COREG Take 1 tablet (12.5 mg total) by mouth 2 (two) times daily with a meal.   diphenhydrAMINE 25 mg capsule Commonly known as: BENADRYL Take 25 mg by mouth every 6 (six) hours as needed for allergies.   feeding supplement Liqd Take 237 mLs by mouth 2 (two) times daily between meals. What changed: when to take this   hydrALAZINE 50 MG tablet Commonly known as: APRESOLINE Take 50 mg by mouth 2 (two) times daily.   imiquimod 5 % cream Commonly known as: ALDARA Apply 1 application topically as needed (as directed, for skin cancer sites).   ondansetron 4 MG tablet Commonly known as: ZOFRAN Take 1 tablet (4 mg total) by mouth every 6 (six) hours as needed for nausea or vomiting.   pantoprazole 40 MG tablet Commonly known as: Protonix Take 1 tablet (40 mg total) by mouth 2 (two) times daily before a meal.   polyethylene glycol 17 g packet Commonly known as: MIRALAX / GLYCOLAX Take 17 g by mouth daily as needed for mild constipation (MIX AS DIRECTED AND DRINK).    SYSTANE OP Place 1 drop into both eyes 3 (three) times daily as needed (dryness).   torsemide 20 MG tablet Commonly known as: DEMADEX Take 20 mg by mouth in the morning.   vitamin B-12 1000 MCG tablet Commonly known as: CYANOCOBALAMIN Take 1 tablet (1,000 mcg total) by mouth daily.   Vitamin D3 50 MCG (2000 UT) Tabs Take 2,000 Units by mouth daily.       Follow-up Information    Leighton Ruff, MD Follow up in 2 week(s).   Specialty: Family Medicine Contact information: [place] [address] [phone]              Allergies  Allergen Reactions  . Epoetin Alfa Nausea Only and Other (See Comments)    Procrit and Epogen: "Chills, low grade fever, body aches, fatigue, nausea for 3 days"   . Penicillins Itching and Rash    Amoxicillin ok- IM pen is what gives reaction  Did it involve swelling of the face/tongue/throat, SOB, or low BP? No Did it involve sudden or severe rash/hives, skin peeling, or any reaction on the inside of your mouth or nose? No Did you need to seek medical attention at a hospital or doctor's office? No When did it last happen?10 + year If all above answers are "NO", may proceed with cephalosporin use.  Other reaction(s): rash, itching  . Retacrit [Epoetin (Alfa)] Other (See Comments)    Chills, low grade fever, body aches, fatigue, nausea for 3 days.  . Crestor [Rosuvastatin] Nausea Only  . Hydrocodone Itching and Other (See Comments)    And the patient "got mean"  . Influenza Virus Vaccine Split Swelling and Other (See Comments)    Local swelling and fever  . Tape Other (See Comments)    The skin tears easily  . Cefaclor Itching  . Crestor [Rosuvastatin Calcium] Nausea Only  . Influenza Vaccines Swelling and Other (See Comments)    Swelling and redness at injection site, fever  . Lidocaine Hcl Palpitations and Other (See Comments)    Heart racing if Lidocaine is with EPI  . Nsaids     Pt states she is not  taking because of kidney function   . Percocet [Oxycodone-Acetaminophen] Nausea And Vomiting  . Prednisone Other (See Comments)    Dizziness, spiked blood sugar     Consultations:  Gastroenterology   Procedures/Studies: CT ABDOMEN PELVIS WO CONTRAST  Result Date: 02/27/2021 CLINICAL DATA:  Nonlocalized abdominal pain EXAM: CT ABDOMEN AND PELVIS WITHOUT CONTRAST TECHNIQUE: Multidetector CT imaging of the abdomen and pelvis was performed following the standard protocol without IV contrast. COMPARISON:  CT 11/29/2020, radiograph 11/29/2020 FINDINGS: Lower chest: Lung bases demonstrate cardiomegaly with partially visualized intracardiac pacing leads. Trace pericardial effusion. No acute consolidation or effusion. Small hiatal hernia. Right upper quadrant 16 mm subcutaneous mass. Hepatobiliary: No focal hepatic abnormality. No biliary dilatation. Punctate stones in the gallbladder. Pancreas: Unremarkable. No pancreatic ductal dilatation or surrounding inflammatory changes. Spleen: Multiple granuloma Adrenals/Urinary Tract: Adrenal glands are normal. Kidneys show no hydronephrosis. Multiple low-attenuation renal lesions, probably cysts but further characterisation limited without contrast. Urinary bladder is unremarkable. Stomach/Bowel: Stomach nonenlarged. No dilated small bowel. No bowel wall thickening. Sigmoid colon diverticular disease without acute inflammatory process. Vascular/Lymphatic: Advanced aortic atherosclerosis. Mild aneurysmal dilatation of the distal infrarenal abdominal aorta measuring up to 3.3 cm. No suspicious nodes. Reproductive: Uterus and bilateral adnexa are unremarkable. Other: Negative for free air or free fluid. Musculoskeletal: Chronic superior endplate deformity at L1. No acute osseous abnormality. IMPRESSION: 1. No CT evidence for acute intra-abdominal or pelvic abnormality. 2. Cardiomegaly with trace pericardial effusion. 3. Sigmoid colon diverticular disease without acute  inflammatory process 4. Infrarenal abdominal aortic aneurysm up to 3.3 cm. Recommend follow-up ultrasound every 3 years. This recommendation follows ACR consensus guidelines: White Paper of the ACR Incidental Findings Committee II on Vascular Findings. J Am Coll Radiol 2013; 10:789-794. 5. Right upper quadrant 16 mm subcutaneous mass which may be correlated with physical exam 6. Probable small gallstones Electronically Signed   By: Donavan Foil M.D.   On: 02/27/2021 22:13   DG Chest Portable 1 View  Result Date: 02/27/2021 CLINICAL DATA:  Short of breath.  COVID-19 positive. EXAM: PORTABLE CHEST 1 VIEW COMPARISON:  08/18/2019. FINDINGS: Cardiac silhouette mildly enlarged. Stable left anterior chest wall biventricular cardioverter-defibrillator. Dense calcification along the thoracic aorta and in the AP window, stable. No mediastinal or hilar masses. Lungs hyperexpanded, but clear. No pleural  effusion or pneumothorax. Skeletal structures are grossly intact. IMPRESSION: 1. No acute cardiopulmonary disease. Electronically Signed   By: Lajean Manes M.D.   On: 02/27/2021 14:05     Subjective: Afebrile - VSS - sats 100% on RA   Discharge Exam: Vitals:   03/09/21 0523 03/09/21 1431  BP: (!) 134/45 (!) 121/53  Pulse: 70 74  Resp: 18 17  Temp: 98.7 F (37.1 C) 98.9 F (37.2 C)  SpO2: 100% 98%   Vitals:   03/08/21 1422 03/08/21 2025 03/09/21 0523 03/09/21 1431  BP: 134/67 (!) 145/45 (!) 134/45 (!) 121/53  Pulse: 69 75 70 74  Resp: 17 17 18 17   Temp: 98.8 F (37.1 C) 98.7 F (37.1 C) 98.7 F (37.1 C) 98.9 F (37.2 C)  TempSrc: Oral Oral Oral   SpO2: 97% 98% 100% 98%  Weight:      Height:        General: No acute respiratory distress Lungs: Clear to auscultation bilaterally without wheezes or crackles Cardiovascular: Regular rate and rhythm without murmur  Abdomen: Nontender, nondistended, soft, bowel sounds positive Extremities: No significant edema bilateral lower  extremities  Microbiology: No results found for this or any previous visit (from the past 240 hour(s)).   Time coordinating discharge:  35 minutes  SIGNED:   Cherene Altes, MD  [facility] 03/09/2021, 6:08 PM

## 2021-03-09 NOTE — Progress Notes (Signed)
Physical Therapy Treatment Patient Details Name: Krista Mason MRN: [id number] DOB: [birth year] Today's Date: 03/09/2021    History of Present Illness Patient admitted with Acute on chronic anemia secondary to upper GI bleed and found to be COVID positive. PMH includes chronic systolic CHF, A. fib on Eliquis, GERD, CKD stage IIIb, HTN, HLD, COPD Gold stage I (2018). EGD 5/10 found erosive esophagiti.    PT Comments    Pt up in chair and reported walking with OT this AM. She is agreeable to participate with therapy. Once up and ambulating pt became quiet and mild change in pallor. When questions, pt slow to respond, but eventually reported her LEs were numb and states this often occurs when her Hgb is low. Took mod encouragement to turn around as pt wanted to increase her ambulation distance from what she had done with OT. Vitals stable on RA once returned to room. No recent labs to check Hgb levels, but they were low on 5/17. May plan to bring rollator next session so pt can have seated rest breaks. Will continue to follow acutely.     Follow Up Recommendations  SNF;Supervision for mobility/OOB     Equipment Recommendations  None recommended by PT    Recommendations for Other Services       Precautions / Restrictions Precautions Precautions: Fall Restrictions Weight Bearing Restrictions: No    Mobility  Bed Mobility               General bed mobility comments: up in chair on arrival    Transfers Overall transfer level: Needs assistance Equipment used: Rolling walker (2 wheeled) Transfers: Sit to/from Stand Sit to Stand: Min guard         General transfer comment: min guard for safety  Ambulation/Gait Ambulation/Gait assistance: Min guard Gait Distance (Feet): 80 Feet Assistive device: Rolling walker (2 wheeled) Gait Pattern/deviations: Step-through pattern;Decreased stride length Gait velocity: decreased   General Gait Details: Self corrected posture at start  of gait. Once in hallway, pt abruptly became quiet with change in palor. Did not initially respond to questions about how pt was feeling. Eventually stated her LEs were numb. Pt wanting to continue with gait, however PTA insisted on turning around and checking vitals. SpO2 at 97% with HR in the mid 70s.   Stairs             Wheelchair Mobility    Modified Rankin (Stroke Patients Only)       Balance Overall balance assessment: Mild deficits observed, not formally tested                                          Cognition Arousal/Alertness: Awake/alert Behavior During Therapy: WFL for tasks assessed/performed Overall Cognitive Status: Within Functional Limits for tasks assessed                                 General Comments: some trouble with recall, but able to call and order lunch tray w/o assist.      Exercises      General Comments        Pertinent Vitals/Pain Pain Assessment: 0-10 Pain Score: 5  Pain Location: headache Pain Intervention(s): Monitored during session;Limited activity within patient's tolerance    Home Living  Prior Function            PT Goals (current goals can now be found in the care plan section) Acute Rehab PT Goals Patient Stated Goal: to get stronger and walk around the room, go to rehab and then go home PT Goal Formulation: With patient Time For Goal Achievement: 03/17/21 Potential to Achieve Goals: Good Progress towards PT goals: Progressing toward goals    Frequency    Min 2X/week      PT Plan Current plan remains appropriate    Co-evaluation              AM-PAC PT "6 Clicks" Mobility   Outcome Measure  Help needed turning from your back to your side while in a flat bed without using bedrails?: None Help needed moving from lying on your back to sitting on the side of a flat bed without using bedrails?: None Help needed moving to and from a bed to a  chair (including a wheelchair)?: A Little Help needed standing up from a chair using your arms (e.g., wheelchair or bedside chair)?: A Little Help needed to walk in hospital room?: A Little Help needed climbing 3-5 steps with a railing? : A Little 6 Click Score: 20    End of Session Equipment Utilized During Treatment: Gait belt Activity Tolerance: Treatment limited secondary to medical complications (Comment) (numbness in legs) Patient left: with call bell/phone within reach;in chair Nurse Communication: Mobility status (numbness in LEs, need for hgb to be checked) PT Visit Diagnosis: Difficulty in walking, not elsewhere classified (R26.2);Muscle weakness (generalized) (M62.81);Unsteadiness on feet (R26.81)     Time: 1610-9604 PT Time Calculation (min) (ACUTE ONLY): 24 min  Charges:  $Gait Training: 23-37 mins                     Benjiman Core, [place] Pager [phone] [place] 03/09/2021, 12:24 PM

## 2021-03-09 NOTE — Plan of Care (Signed)

## 2021-03-09 NOTE — Progress Notes (Signed)
Occupational Therapy Treatment Patient Details Name: Krista Mason MRN: [id number] DOB: 16-Feb-1936 Today's Date: 03/09/2021    History of present illness Patient admitted with Acute on chronic anemia secondary to upper GI bleed and found to be COVID positive. PMH includes chronic systolic CHF, A. fib on Eliquis, GERD, CKD stage IIIb, HTN, HLD, COPD Gold stage I (2018). EGD 5/10 found erosive esophagiti.   OT comments  Pt demonstrating increased activity tolerance and functional mobility. Pt is not requiring any physical assistance with functional mobility at this time and was able to complete ambulation in the hall after full body bathing sitting on BSC. Pt is still concerned with her energy level, reporting that she can not tolerate [place] activities such as walking like she used to. OT will continue following pt to assist in progressing ADL performance and functional mobility.   Follow Up Recommendations  SNF    Equipment Recommendations  None recommended by OT    Recommendations for Other Services      Precautions / Restrictions Precautions Precautions: Fall Restrictions Weight Bearing Restrictions: No       Mobility Bed Mobility               General bed mobility comments: Transferring to [facility] on arrival    Transfers Overall transfer level: Needs assistance Equipment used: Rolling walker (2 wheeled) Transfers: Sit to/from Stand Sit to Stand: Supervision         General transfer comment: sup for saftey    Balance Overall balance assessment: No apparent balance deficits (not formally assessed)                                         ADL either performed or assessed with clinical judgement   ADL Overall ADL's : Needs assistance/impaired     Grooming: Wash/dry face;Wash/dry hands;Oral care;Set up;Sitting Grooming Details (indicate cue type and reason): completed on BSC Upper Body Bathing: Set up;Sitting Upper Body Bathing Details (indicate  cue type and reason): Completed on [facility] Lower Body Bathing: Minimal assistance;Sit to/from stand;Sitting/lateral leans Lower Body Bathing Details (indicate cue type and reason): Min assist for thoroughness with pericare Upper Body Dressing : Set up;Sitting Upper Body Dressing Details (indicate cue type and reason): donned clean hospital gown Lower Body Dressing: Set up;Sitting/lateral leans Lower Body Dressing Details (indicate cue type and reason): doffed and donned socks in figure 4 position Toilet Transfer: Supervision/safety;Ambulation;BSC Toilet Transfer Details (indicate cue type and reason): completed toileting on BSC, would be able to make it in to bathroom if needed, pt experiencing urinary urgency at this time Toileting- Clothing Manipulation and Hygiene: Supervision/safety;Sitting/lateral lean;Sit to/from stand Toileting - Clothing Manipulation Details (indicate cue type and reason): Supervision for safety     Functional mobility during ADLs: Supervision/safety;Rolling walker General ADL Comments: Pt having increased activity tolerance this session, completed bathing with setup and min assist for pericare. dressing and grooming while seated, and functional mobility out in the hall with supervision for safety. s     Vision       Perception     Praxis      Cognition Arousal/Alertness: Awake/alert Behavior During Therapy: [facility] for tasks assessed/performed Overall Cognitive Status: Within Functional Limits for tasks assessed  General Comments: some trouble with recall, but able to call and order lunch tray w/o assist.        Exercises     Shoulder Instructions       General Comments VSS on RA, pt experiencing some dyspnea on exersion, however was able to recover quickly    Pertinent Vitals/ Pain       Pain Assessment: No/denies pain Pain Score: 5  Pain Location: headache Pain Intervention(s): Monitored during  session;Limited activity within patient's tolerance  Home Living                                          Prior Functioning/Environment              Frequency  Min 2X/week        Progress Toward Goals  OT Goals(current goals can now be found in the care plan section)  Progress towards OT goals: Progressing toward goals  Acute Rehab OT Goals Patient Stated Goal: To go to rehab OT Goal Formulation: With patient Time For Goal Achievement: 03/16/21 Potential to Achieve Goals: Good ADL Goals Pt Will Perform Lower Body Dressing: with modified independence;sit to/from stand Pt Will Transfer to Toilet: with modified independence;ambulating;regular height toilet;grab bars Pt Will Perform Toileting - Clothing Manipulation and hygiene: with modified independence;sit to/from stand Pt/caregiver will Perform Home Exercise Program: Increased strength;Right Upper extremity;Left upper extremity Additional ADL Goal #1: Patient will stand at sink x 3 min to perform grooming task as evidence of improving activity tolerance  Plan Discharge plan remains appropriate    Co-evaluation                 AM-PAC OT "6 Clicks" Daily Activity     Outcome Measure   Help from another person eating meals?: None Help from another person taking care of personal grooming?: A Little Help from another person toileting, which includes using toliet, bedpan, or urinal?: A Little Help from another person bathing (including washing, rinsing, drying)?: A Little Help from another person to put on and taking off regular upper body clothing?: A Little Help from another person to put on and taking off regular lower body clothing?: A Little 6 Click Score: 19    End of Session Equipment Utilized During Treatment: Rolling walker  OT Visit Diagnosis: Muscle weakness (generalized) (M62.81)   Activity Tolerance Patient tolerated treatment well   Patient Left in chair;with call bell/phone  within reach   Nurse Communication Mobility status        Time: 1100-1131 OT Time Calculation (min): 31 min  Charges: OT General Charges $OT Visit: 1 Visit OT Treatments $Self Care/Home Management : 23-37 mins  Edgar Corrigan H., OTR/L [place] 03/09/2021, 2:25 PM

## 2021-03-09 NOTE — Consult Note (Signed)
   [facility] CM Inpatient Consult   03/09/2021  Krista Mason October 20, 1936 [id number]  [place]  Accountable Care Organization [ACO] Patient:  Krista Mason Medicare   Patient screened for length of stay hospitalization to assess for potential [place] Management service needs for post hospital transition.  Review of patient's medical record briefly for PT/OT and inpatient [facility] team notes for disposition reveals patient is for a skilled nursing facility stay.   Plan:  No current [facility] Care Management needs assessed as patient's transition of care will be at a skilled nursing facility. Patient was from an [place] prior to admission noted. Will follow til disposition is completed.  For questions contact:   Natividad Brood, RN BSN [facility] Liaison  [phone] business mobile phone Toll free office [phone]  Fax number: [phone] [email or web address]

## 2021-03-10 LAB — CBC
HCT: 25.3 % — ABNORMAL LOW (ref 36.0–46.0)
Hemoglobin: 8.1 g/dL — ABNORMAL LOW (ref 12.0–15.0)
MCH: 29 pg (ref 26.0–34.0)
MCHC: 32 g/dL (ref 30.0–36.0)
MCV: 90.7 fL (ref 80.0–100.0)
Platelets: 174 10*3/uL (ref 150–400)
RBC: 2.79 MIL/uL — ABNORMAL LOW (ref 3.87–5.11)
RDW: 18.8 % — ABNORMAL HIGH (ref 11.5–15.5)
WBC: 8.9 10*3/uL (ref 4.0–10.5)
nRBC: 0 % (ref 0.0–0.2)

## 2021-03-10 LAB — BASIC METABOLIC PANEL
Anion gap: 7 (ref 5–15)
BUN: 23 mg/dL (ref 8–23)
CO2: 25 mmol/L (ref 22–32)
Calcium: 8.4 mg/dL — ABNORMAL LOW (ref 8.9–10.3)
Chloride: 99 mmol/L (ref 98–111)
Creatinine, Ser: 1.92 mg/dL — ABNORMAL HIGH (ref 0.44–1.00)
GFR, Estimated: 26 mL/min — ABNORMAL LOW (ref >60)
Glucose, Bld: 214 mg/dL — ABNORMAL HIGH (ref 70–99)
Potassium: 2.7 mmol/L — CL (ref 3.5–5.1)
Sodium: 131 mmol/L — ABNORMAL LOW (ref 135–145)

## 2021-03-10 LAB — GLUCOSE, CAPILLARY: Glucose-Capillary: 240 mg/dL — ABNORMAL HIGH (ref 70–99)

## 2021-03-10 MED ORDER — POTASSIUM CHLORIDE CRYS ER 20 MEQ PO TBCR: 40.0000 meq | EXTENDED_RELEASE_TABLET | Freq: Two times a day (BID) | ORAL | Status: DC

## 2021-03-10 MED ORDER — POTASSIUM CHLORIDE 20 MEQ PO PACK
40.0000 meq | PACK | Freq: Two times a day (BID) | ORAL | Status: DC
  Administered 2021-03-10 (×2): 40 meq via ORAL
  Filled 2021-03-10 (×2): qty 2

## 2021-03-10 MED ORDER — POTASSIUM CHLORIDE CRYS ER 20 MEQ PO TBCR
40.0000 meq | EXTENDED_RELEASE_TABLET | Freq: Once | ORAL | Status: AC
  Administered 2021-03-10: 40 meq via ORAL
  Filled 2021-03-10: qty 2

## 2021-03-10 NOTE — TOC Progression Note (Signed)
Transition of Care [facility]) - Progression Note    Patient Details  Name: Krista Mason MRN: [id number] Date of Birth: [birth year]  Transition of Care [facility]) CM/SW [place], [place] Phone Number: 03/10/2021, 4:00 PM  Clinical Narrative:     Insurance Josem Kaufmann has not yet been received. [facility] is closed on Saturdays and Sundays, pt will be here through the weekend. Pt was positive for covid 19 on 02/27/21 and des not need a repeat covid test.  TOC will continue to follow.  Expected Discharge Plan: Skilled Nursing Facility Barriers to Discharge: Continued Medical Work up,SNF Covid  Expected Discharge Plan and Services Expected Discharge Plan: [place] arrangements for the past 2 months: [place] Expected Discharge Date: 03/09/21                                     Social Determinants of Health (SDOH) Interventions    Readmission Risk Interventions No flowsheet data found.  Emeterio Reeve, Latanya Presser, [place] Social Worker [phone]

## 2021-03-10 NOTE — Progress Notes (Signed)
Krista Mason  ESP:[id number] DOB: 07-23-1936 DOA: 02/27/2021 PCP: Leighton Ruff, MD    Brief Narrative:  85 year old with a history of chronic systolic CHF, atrial fibrillation on Eliquis, GERD, CKD stage IIIb, HTN, HLD, and COPD who was admitted with crampy abdominal pain and dark stools.  She had also been diagnosed with COVID in the outpatient setting as per a home test 02/22/2021.  At the time of her admission she was found to be experiencing a GI bleed.  Consultants:  Gastroenterology  Code Status: FULL CODE  Antimicrobials:  None  DVT prophylaxis: SCDs  Subjective: Resting comfortably in bed.  No new complaints.  Simply awaiting insurance approval for SNF rehab stay.  Assessment & Plan:  Severe distal ulcerative esophageal stricture Noted on EGD 5/10 -status post dilatation -clinically stable at this time  GI bleed -anemia of acute blood loss Baseline hemoglobin approximately 10.5 -hemoglobin nadir 7.9 -dosed with Feraheme 5/11 -holding Eliquis for 2 weeks with resumption 5/23 -continue high-dose Protonix and Carafate -dosed with her usual dose of Aranesp during this admission -hemoglobin stable  Mildly symptomatic COVID infection CXR without significant infiltrate -afebrile with no hypoxia -dosed with 3-day course of Remdesivir given high risk status -clinically stable -Home test +5/4 but documented positive test in hospital 5/9 -does not require isolation at our facility but SNF would not accept her until 5/19  Hypokalemia Due to poor intake and diuretic use -supplement  Recent Labs  Lab 03/10/21 0218  K 2.7*   Chronic systolic CHF Euvolemic on usual torsemide dose  CKD stage IIIb Baseline creatinine approximately 2 - stable at this time  HTN Blood pressure presently well controlled  Paroxysmal atrial fib Rate controlled - Eliquis on hold until 5/23   Family Communication:  Status is: Inpatient  Remains inpatient appropriate because:Unsafe d/c  plan   Dispo: The patient is from: Home              Anticipated d/c is to: SNF              Patient currently is medically stable to d/c.   Difficult to place patient No    Objective: Blood pressure (!) 158/55, pulse 75, temperature 98.8 F (37.1 C), temperature source Oral, resp. rate 19, height 5\' 5"  (1.651 m), weight 66.8 kg, SpO2 98 %.  Intake/Output Summary (Last 24 hours) at 03/10/2021 1646 Last data filed at 03/10/2021 1300 Gross per 24 hour  Intake 600 ml  Output --  Net 600 ml   Filed Weights   02/27/21 1302 02/27/21 2015 02/28/21 1331  Weight: 68 kg 66.8 kg 66.8 kg    Examination: General: No acute respiratory distress Lungs: CTA B without wheeze Cardiovascular: RRR Abdomen: NT/ND, soft, BS positive Extremities: No significant edema bilateral lower extremities  CBC: Recent Labs  Lab 03/06/21 0315 03/10/21 0218  WBC  --  8.9  HGB 8.7* 8.1*  HCT 27.1* 25.3*  MCV  --  90.7  PLT  --  174    HbA1C: Hgb A1c MFr Bld  Date/Time Value Ref Range Status  06/11/2019 12:06 PM 6.9 (H) 4.8 - 5.6 % Final    Comment:    (NOTE) Pre diabetes:          5.7%-6.4% Diabetes:              >6.4% Glycemic control for   <7.0% adults with diabetes   11/05/2016 08:09 AM 6.2 (H) 4.8 - 5.6 % Final    Comment:    (  NOTE)         Pre-diabetes: 5.7 - 6.4         Diabetes: >6.4         Glycemic control for adults with diabetes: <7.0     No results found for this or any previous visit (from the past 240 hour(s)).   Scheduled Meds: . amiodarone  100 mg Oral Q48H  . atorvastatin  20 mg Oral Daily  . carvedilol  12.5 mg Oral BID WC  . feeding supplement  237 mL Oral BID BM  . hydrALAZINE  50 mg Oral BID  . pantoprazole  40 mg Oral BID  . potassium chloride  40 mEq Oral BID  . sucralfate  1 g Oral TID WC & HS  . torsemide  20 mg Oral Daily     LOS: 11 days   Cherene Altes, MD [facility] Office  [phone] Pager - Text Page per Amion  If 7PM-7AM,  please contact night-coverage per Amion 03/10/2021, 4:46 PM

## 2021-03-10 NOTE — Progress Notes (Signed)
Patient potassium level down 2.7 this morning. Text paged NP  X. Blount. New orders received and carried out.

## 2021-03-11 LAB — BASIC METABOLIC PANEL
Anion gap: 9 (ref 5–15)
BUN: 22 mg/dL (ref 8–23)
CO2: 24 mmol/L (ref 22–32)
Calcium: 9.2 mg/dL (ref 8.9–10.3)
Chloride: 100 mmol/L (ref 98–111)
Creatinine, Ser: 1.82 mg/dL — ABNORMAL HIGH (ref 0.44–1.00)
GFR, Estimated: 27 mL/min — ABNORMAL LOW (ref >60)
Glucose, Bld: 168 mg/dL — ABNORMAL HIGH (ref 70–99)
Potassium: 4 mmol/L (ref 3.5–5.1)
Sodium: 133 mmol/L — ABNORMAL LOW (ref 135–145)

## 2021-03-11 LAB — MAGNESIUM: Magnesium: 1.9 mg/dL (ref 1.7–2.4)

## 2021-03-11 MED ORDER — POTASSIUM CHLORIDE 20 MEQ PO PACK
40.0000 meq | PACK | Freq: Every day | ORAL | Status: DC
  Administered 2021-03-12 – 2021-03-14 (×3): 40 meq via ORAL
  Filled 2021-03-11 (×3): qty 2

## 2021-03-11 MED ORDER — POTASSIUM CHLORIDE 20 MEQ PO PACK: 40.0000 meq | PACK | Freq: Every day | ORAL | Status: DC

## 2021-03-11 NOTE — Progress Notes (Signed)
Notified Dr. Thereasa Solo on his rounds of patient's heart rate dropping into the mid-30s and going as high as the upper 80s. Pt pacer maker may need re-evaluated/recalibrated.

## 2021-03-11 NOTE — Progress Notes (Signed)
DAWNN NAM  NUU:[id number] DOB: 10-14-1936 DOA: 02/27/2021 PCP: Leighton Ruff, MD    Brief Narrative:  [phone] with a history of chronic systolic CHF, atrial fibrillation on Eliquis, GERD, CKD stage IIIb, HTN, HLD, and COPD who was admitted with crampy abdominal pain and dark stools.  She had also been diagnosed with COVID in the outpatient setting as per a home test 02/22/2021.  At the time of her admission she was found to be experiencing a GI bleed.  Consultants:  Gastroenterology  Code Status: FULL CODE  Antimicrobials:  None  DVT prophylaxis: SCDs  Subjective: Hospitalization prolonged due to delay in insurance authorization. Medically stable for d/c since 5/19 (actually 5/17 but CoViD issues extended to 5/19).   Vital stable.  Afebrile.   Assessment & Plan:  Severe distal ulcerative esophageal stricture Noted on EGD 5/10 -status post dilatation -clinically stable at this time  GI bleed -anemia of acute blood loss Baseline hemoglobin approximately 10.5 -hemoglobin nadir 7.9 -dosed with Feraheme 5/11 -holding Eliquis for 2 weeks with resumption 5/23 -continue high-dose Protonix and Carafate -dosed with her usual dose of Aranesp during this admission -hemoglobin stable  Mildly symptomatic COVID infection CXR without significant infiltrate -afebrile with no hypoxia -dosed with 3-day course of Remdesivir given high risk status -clinically stable - home test +5/4 but documented positive test in hospital 5/9 -does not require isolation at our facility but SNF would not accept her until 5/19  Hypokalemia Due to poor intake and diuretic use -corrected with supplementation  Recent Labs  Lab 03/10/21 0218 03/11/21 0204  K 2.7* 4.0   Chronic systolic CHF - s/p CRT-P pacemaker insertion  Euvolemic on usual torsemide dose - followed in ED clinic   CKD stage IIIb Baseline creatinine approximately 2 - stable at this time  HTN Blood pressure presently well  controlled  Paroxysmal atrial fib Rate controlled - Eliquis on hold until 5/23   Family Communication:  Status is: Inpatient  Remains inpatient appropriate because:Unsafe d/c plan   Dispo: The patient is from: Home              Anticipated d/c is to: SNF              Patient currently is medically stable to d/c.   Difficult to place patient No    Objective: Blood pressure (!) 141/54, pulse 70, temperature 98.7 F (37.1 C), temperature source Oral, resp. rate 18, height 5\' 5"  (1.651 m), weight 66.8 kg, SpO2 97 %.  Intake/Output Summary (Last 24 hours) at 03/11/2021 0941 Last data filed at 03/11/2021 0547 Gross per 24 hour  Intake 700 ml  Output --  Net 700 ml   Filed Weights   02/27/21 1302 02/27/21 2015 02/28/21 1331  Weight: 68 kg 66.8 kg 66.8 kg    Examination: No exam today - awaiting SNF placement  CBC: Recent Labs  Lab 03/06/21 0315 03/10/21 0218  WBC  --  8.9  HGB 8.7* 8.1*  HCT 27.1* 25.3*  MCV  --  90.7  PLT  --  174    HbA1C: Hgb A1c MFr Bld  Date/Time Value Ref Range Status  06/11/2019 12:06 PM 6.9 (H) 4.8 - 5.6 % Final    Comment:    (NOTE) Pre diabetes:          5.7%-6.4% Diabetes:              >6.4% Glycemic control for   <7.0% adults with diabetes   11/05/2016 08:09 AM  6.2 (H) 4.8 - 5.6 % Final    Comment:    (NOTE)         Pre-diabetes: 5.7 - 6.4         Diabetes: >6.4         Glycemic control for adults with diabetes: <7.0     No results found for this or any previous visit (from the past 240 hour(s)).   Scheduled Meds: . amiodarone  100 mg Oral Q48H  . atorvastatin  20 mg Oral Daily  . carvedilol  12.5 mg Oral BID WC  . feeding supplement  237 mL Oral BID BM  . hydrALAZINE  50 mg Oral BID  . pantoprazole  40 mg Oral BID  . potassium chloride  40 mEq Oral BID  . sucralfate  1 g Oral TID WC & HS  . torsemide  20 mg Oral Daily     LOS: 12 days   Cherene Altes, MD [facility] Office  [phone] Pager -  Text Page per Amion  If 7PM-7AM, please contact night-coverage per Amion 03/11/2021, 9:41 AM

## 2021-03-11 NOTE — Progress Notes (Signed)
Pt ambulated 100 feet in the hallway with a front wheel walker.  When she sat down on her bed, she said her feet and legs were starting to get numb and she was a little breathless. She was excited to have walked.

## 2021-03-12 LAB — CBC
HCT: 26.8 % — ABNORMAL LOW (ref 36.0–46.0)
Hemoglobin: 8.5 g/dL — ABNORMAL LOW (ref 12.0–15.0)
MCH: 29.2 pg (ref 26.0–34.0)
MCHC: 31.7 g/dL (ref 30.0–36.0)
MCV: 92.1 fL (ref 80.0–100.0)
Platelets: 209 10*3/uL (ref 150–400)
RBC: 2.91 MIL/uL — ABNORMAL LOW (ref 3.87–5.11)
RDW: 18.9 % — ABNORMAL HIGH (ref 11.5–15.5)
WBC: 7.9 10*3/uL (ref 4.0–10.5)
nRBC: 0 % (ref 0.0–0.2)

## 2021-03-12 LAB — BASIC METABOLIC PANEL
Anion gap: 10 (ref 5–15)
BUN: 26 mg/dL — ABNORMAL HIGH (ref 8–23)
CO2: 27 mmol/L (ref 22–32)
Calcium: 9.1 mg/dL (ref 8.9–10.3)
Chloride: 93 mmol/L — ABNORMAL LOW (ref 98–111)
Creatinine, Ser: 2.19 mg/dL — ABNORMAL HIGH (ref 0.44–1.00)
GFR, Estimated: 22 mL/min — ABNORMAL LOW (ref >60)
Glucose, Bld: 250 mg/dL — ABNORMAL HIGH (ref 70–99)
Potassium: 4.1 mmol/L (ref 3.5–5.1)
Sodium: 130 mmol/L — ABNORMAL LOW (ref 135–145)

## 2021-03-12 NOTE — Progress Notes (Signed)
Pt ambulated 10 feet to and from bathroom in room for a BM then sat for about 20 minutes.  Pt ambulated in hallway with front wheel walker, belt, and assistance 98 feet.  Pt heart remained between 69 and 71. Pt O2 sat 98-100% on room air. Pt had dyspnea with exertion but no change in VS.

## 2021-03-12 NOTE — Progress Notes (Signed)
Pt stated she felt like she "got kick" several times last night (term she uses when her pace maker fires). Her heart rate was 68-71 the entire shift.

## 2021-03-12 NOTE — Progress Notes (Signed)
Krista Mason  KXF:[id number] DOB: 1936-10-11 DOA: 02/27/2021 PCP: Leighton Ruff, MD    Brief Narrative:  [phone] with a history of chronic systolic CHF, atrial fibrillation on Eliquis, GERD, CKD stage IIIb, HTN, HLD, and COPD who was admitted with crampy abdominal pain and dark stools.  She had also been diagnosed with COVID in the outpatient setting as per a home test 02/22/2021.  At the time of her admission she was found to be experiencing a GI bleed.  Consultants:  Gastroenterology  Code Status: FULL CODE  Antimicrobials:  None  DVT prophylaxis: SCDs  Subjective: Hospitalization prolonged due to delay in insurance authorization. Medically stable for d/c since 5/19 (actually 5/17 but CoViD issues extended to 5/19).   Afebrile.  Vital signs remain stable.  Potassium stable.  Hemoglobin stable.  Renal function stable.  Feels that she is growing weaker each day.  Assessment & Plan:  Severe distal ulcerative esophageal stricture Noted on EGD 5/10 -status post dilatation -clinically stable at this time  GI bleed -anemia of acute blood loss Baseline hemoglobin approximately 10.5 -hemoglobin nadir 7.9 -dosed with Feraheme 5/11 -holding Eliquis for 2 weeks with resumption 5/23 -continue high-dose Protonix and Carafate -dosed with her usual dose of Aranesp during this admission -hemoglobin stable/improving  Mildly symptomatic COVID infection CXR without significant infiltrate -afebrile with no hypoxia -dosed with 3-day course of Remdesivir given high risk status -clinically stable - home test +5/4 but documented positive test in hospital 5/9 -does not require isolation at our facility but SNF would not accept her until 5/19  Hypokalemia Due to poor intake and diuretic use -corrected with supplementation -continue scheduled supplement with ongoing use of diuretic  Recent Labs  Lab 03/10/21 0218 03/11/21 0204 03/12/21 0218  K 2.7* 4.0 4.1   Chronic systolic CHF - s/p CRT-P  pacemaker insertion  Euvolemic on usual torsemide dose - followed in CHF clinic   CKD stage IIIb Baseline creatinine approximately 2 - stable at this time  HTN Blood pressure presently well controlled  Paroxysmal atrial fib Rate controlled - Eliquis on hold until 5/23   Family Communication:  Status is: Inpatient  Remains inpatient appropriate because:Unsafe d/c plan   Dispo: The patient is from: Home              Anticipated d/c is to: SNF              Patient currently is medically stable to d/c.   Difficult to place patient No    Objective: Blood pressure (!) 155/56, pulse 70, temperature 98.8 F (37.1 C), temperature source Oral, resp. rate 17, height 5\' 5"  (1.651 m), weight 66.8 kg, SpO2 99 %.  Intake/Output Summary (Last 24 hours) at 03/12/2021 0922 Last data filed at 03/12/2021 0100 Gross per 24 hour  Intake 420 ml  Output --  Net 420 ml   Filed Weights   02/27/21 1302 02/27/21 2015 02/28/21 1331  Weight: 68 kg 66.8 kg 66.8 kg    Examination: General: No acute respiratory distress Lungs: Clear to auscultation bilaterally without wheezes or crackles Cardiovascular: Regular rate and rhythm without murmur  Abdomen: NT'ND, soft  Extremities: No edema bilateral lower extremities   CBC: Recent Labs  Lab 03/06/21 0315 03/10/21 0218 03/12/21 0218  WBC  --  8.9 7.9  HGB 8.7* 8.1* 8.5*  HCT 27.1* 25.3* 26.8*  MCV  --  90.7 92.1  PLT  --  174 209    HbA1C: Hgb A1c MFr Bld  Date/Time  Value Ref Range Status  06/11/2019 12:06 PM 6.9 (H) 4.8 - 5.6 % Final    Comment:    (NOTE) Pre diabetes:          5.7%-6.4% Diabetes:              >6.4% Glycemic control for   <7.0% adults with diabetes   11/05/2016 08:09 AM 6.2 (H) 4.8 - 5.6 % Final    Comment:    (NOTE)         Pre-diabetes: 5.7 - 6.4         Diabetes: >6.4         Glycemic control for adults with diabetes: <7.0     No results found for this or any previous visit (from the past 240 hour(s)).    Scheduled Meds: . amiodarone  100 mg Oral Q48H  . atorvastatin  20 mg Oral Daily  . carvedilol  12.5 mg Oral BID WC  . feeding supplement  237 mL Oral BID BM  . hydrALAZINE  50 mg Oral BID  . pantoprazole  40 mg Oral BID  . potassium chloride  40 mEq Oral Daily  . sucralfate  1 g Oral TID WC & HS  . torsemide  20 mg Oral Daily     LOS: 13 days   Cherene Altes, MD [facility] Office  [phone] Pager - Text Page per Amion  If 7PM-7AM, please contact night-coverage per Amion 03/12/2021, 9:22 AM

## 2021-03-12 NOTE — Progress Notes (Signed)
Pt ambulated 67 feet with front wheel walker and safety belt. Pt out of breath and more exhausted than yesterday.  O2 sat 98%, HR 71

## 2021-03-13 MED ORDER — POTASSIUM CHLORIDE 20 MEQ PO PACK: 20.0000 meq | PACK | Freq: Every day | ORAL | Status: DC

## 2021-03-13 MED ORDER — APIXABAN 2.5 MG PO TABS
2.5000 mg | ORAL_TABLET | Freq: Two times a day (BID) | ORAL | Status: DC
  Administered 2021-03-13 – 2021-03-14 (×3): 2.5 mg via ORAL
  Filled 2021-03-13 (×4): qty 1

## 2021-03-13 NOTE — Progress Notes (Signed)
Occupational Therapy Treatment Patient Details Name: TERRIAN RIDLON MRN: [id number] DOB: 1935/11/29 Today's Date: 03/13/2021    History of present illness Patient admitted with Acute on chronic anemia secondary to upper GI bleed and found to be COVID positive. PMH includes chronic systolic CHF, A. fib on Eliquis, GERD, CKD stage IIIb, HTN, HLD, COPD Gold stage I (2018). EGD 5/10 found erosive esophagiti.   OT comments  Pt making good progress with functional goals. Session focused on functional mobility using RW to walk to bathroom, toilet transfers, toileting tasks, LB ADLs. Pt eager to d/c to SNF to begin post acute rehab. OT will continue to follow acutely to maximize level of function and safety  Follow Up Recommendations  SNF    Equipment Recommendations  Other (comment) (TBD at SNF)    Recommendations for Other Services      Precautions / Restrictions Precautions Precautions: Fall Restrictions Weight Bearing Restrictions: No       Mobility Bed Mobility Overal bed mobility: Needs Assistance Bed Mobility: Supine to Sit;Sit to Supine     Supine to sit: Supervision Sit to supine: Supervision        Transfers Overall transfer level: Needs assistance Equipment used: Rolling walker (2 wheeled) Transfers: Sit to/from Stand Sit to Stand: Supervision Stand pivot transfers: Supervision            Balance Overall balance assessment: No apparent balance deficits (not formally assessed)                                         ADL either performed or assessed with clinical judgement   ADL                                               Vision Patient Visual Report: No change from baseline     Perception     Praxis      Cognition Arousal/Alertness: Awake/alert Behavior During Therapy: WFL for tasks assessed/performed Overall Cognitive Status: Within Functional Limits for tasks assessed                                  General Comments: some difficulty with recall        Exercises     Shoulder Instructions       General Comments      Pertinent Vitals/ Pain       Pain Assessment: No/denies pain Pain Score: 0-No pain Pain Intervention(s): Monitored during session  Home Living                                          Prior Functioning/Environment              Frequency  Min 2X/week        Progress Toward Goals  OT Goals(current goals can now be found in the care plan section)  Progress towards OT goals: Progressing toward goals     Plan Discharge plan remains appropriate    Co-evaluation                 AM-PAC OT "6 Clicks" Daily  Activity     Outcome Measure   Help from another person eating meals?: None Help from another person taking care of personal grooming?: A Little Help from another person toileting, which includes using toliet, bedpan, or urinal?: A Little Help from another person bathing (including washing, rinsing, drying)?: A Little Help from another person to put on and taking off regular upper body clothing?: None Help from another person to put on and taking off regular lower body clothing?: A Little 6 Click Score: 20    End of Session Equipment Utilized During Treatment: Rolling walker;Gait belt  OT Visit Diagnosis: Muscle weakness (generalized) (M62.81)   Activity Tolerance Patient tolerated treatment well   Patient Left with call bell/phone within reach;in bed   Nurse Communication          Time: 1203-1228 OT Time Calculation (min): 25 min  Charges: OT General Charges $OT Visit: 1 Visit OT Treatments $Self Care/Home Management : 8-22 mins $Therapeutic Activity: 8-22 mins     Britt Bottom 03/13/2021, 2:18 PM

## 2021-03-13 NOTE — Discharge Summary (Addendum)
Physician Discharge Summary    Krista Mason GBT:[id number] DOB: May 22, 1936 DOA: 02/27/2021  PCP: Leighton Ruff, MD  Admit date: 02/27/2021 Discharge date: 03/13/2021  Admitted From: Home Disposition: Skilled nursing facility  Recommendations for Outpatient Follow-up:  1. Follow up with PCP 1-2 weeks after discharge from SNF 2. Please obtain BMP/CBC in 3-5 days  3. Monitor for blood loss w/ Eliquis resumed 5/23 4. Lower dose of Protonix after 2 months of high dose tx.    Discharge Condition: Stable CODE STATUS: DNR / NO CODE BLUE  Diet recommendation: Low-salt diet  Discharge summary: 85yo with a hx of chronic systolic CHF, atrial fibrillation on Eliquis, GERD, CKD stage IIIb, HTN, HLD, and COPD who was admitted with crampy abdominal pain and dark stools.  She had also been diagnosed with COVID in the outpatient setting as per a home test 02/22/2021.  At the time of her admission she was found to be experiencing a GI bleed.  Assessment & Plan of care:    Acute on chronic anemia secondary to upper GI bleeding: EGD 5/10 per Dr. Michail Sermon noted severe distal ulcerated esophageal stricture s/p dilatation.   Baseline hemoglobin 10-11.  Presented with hemoglobin of 8.9, and reached a nadir of 7.9. Received 1 dose of Feraheme 5/11 and usual monthly dose of Aranesp 5/16. Hemoglobin is stable at time of d/c.   As per GI recommendation, we held Eliquis for 2 weeks. Eliquis was resumed on 5/23. Soft diet and outpatient follow-up. Currently on high-dose Protonix - completed a course of Carafate.  COVID infection: No significant infiltrate on x-ray.  No hypoxia - no fever.  Patient was given 3days IV remdesivir because of high risk factos and mild symptoms with chronic morbidities. Was diagnosed at home on 5/4.  No more isolation needed. No persisting sx at time of d/c.   Chronic systolic heart failure: Euvolemic.  On torsemide. Monitor K+ w/ ongiong diuretic use.   CKD stage IIIb:   Fairly stable at about her baseline.  Her baseline creatinine is about 2.  She follows up with Nephrology as outpatient.  Essential hypertension: Stable on medications.  Paroxysmal A. Fib: Rate controlled.  Eliquis held for 2 weeks as noted above.  Tolerating Coreg and low-dose amiodarone.  Continue to work with PT OT.    Transfer to skilled nursing rehab.  Patient is stable.   Discharge Diagnoses:  Severe distal ulcerative esophageal stricture GI bleed - melena  Anemia of acute blood loss Mildly symptomatic COVID infection Hypokalemia Chronic systolic CHF - s/p CRT-P pacemaker insertion  CKD stage IIIb HTN Paroxysmal atrial fib NO CODE BLUE - DNR   Discharge Instructions  Discharge Instructions    Diet - low sodium heart healthy   Complete by: As directed    Discharge instructions   Complete by: As directed    Resume taking Eliquis on 5/23   Increase activity slowly   Complete by: As directed      Allergies as of 03/13/2021      Reactions   Epoetin Alfa Nausea Only, Other (See Comments)   Procrit and Epogen: "Chills, low grade fever, body aches, fatigue, nausea for 3 days"   Penicillins Itching, Rash   Amoxicillin ok- IM pen is what gives reaction Did it involve swelling of the face/tongue/throat, SOB, or low BP? No Did it involve sudden or severe rash/hives, skin peeling, or any reaction on the inside of your mouth or nose? No Did you need to seek medical attention at a  hospital or doctor's office? No When did it last happen?10 + year If all above answers are "NO", may proceed with cephalosporin use. Other reaction(s): rash, itching   Retacrit [epoetin (alfa)] Other (See Comments)   Chills, low grade fever, body aches, fatigue, nausea for 3 days.   Crestor [rosuvastatin] Nausea Only   Hydrocodone Itching, Other (See Comments)   And the patient "got mean"   Influenza Virus Vaccine Split Swelling, Other (See Comments)   Local swelling and fever   Tape  Other (See Comments)   The skin tears easily   Cefaclor Itching   Crestor [rosuvastatin Calcium] Nausea Only   Influenza Vaccines Swelling, Other (See Comments)   Swelling and redness at injection site, fever   Lidocaine Hcl Palpitations, Other (See Comments)   Heart racing if Lidocaine is with EPI   Nsaids    Pt states she is not taking because of kidney function   Percocet [oxycodone-acetaminophen] Nausea And Vomiting   Prednisone Other (See Comments)   Dizziness, spiked blood sugar      Medication List    TAKE these medications   acetaminophen 500 MG tablet Commonly known as: TYLENOL Take 1,000 mg by mouth every 6 (six) hours as needed for moderate pain, headache or mild pain.   amiodarone 100 MG tablet Commonly known as: PACERONE Take 100 mg by mouth every other day. What changed: Another medication with the same name was removed. Continue taking this medication, and follow the directions you see here.   atorvastatin 20 MG tablet Commonly known as: LIPITOR Take 1 tablet (20 mg total) by mouth at bedtime.   carvedilol 12.5 MG tablet Commonly known as: COREG Take 1 tablet (12.5 mg total) by mouth 2 (two) times daily with a meal.   diphenhydrAMINE 25 mg capsule Commonly known as: BENADRYL Take 25 mg by mouth every 6 (six) hours as needed for allergies.   Eliquis 2.5 MG Tabs tablet Generic drug: apixaban TAKE 1 TABLET BY MOUTH TWICE A DAY What changed: how much to take   feeding supplement Liqd Take 237 mLs by mouth 2 (two) times daily between meals. What changed: when to take this   hydrALAZINE 50 MG tablet Commonly known as: APRESOLINE Take 50 mg by mouth 2 (two) times daily.   imiquimod 5 % cream Commonly known as: ALDARA Apply 1 application topically as needed (as directed, for skin cancer sites).   ondansetron 4 MG tablet Commonly known as: ZOFRAN Take 1 tablet (4 mg total) by mouth every 6 (six) hours as needed for nausea or vomiting.   pantoprazole  40 MG tablet Commonly known as: Protonix Take 1 tablet (40 mg total) by mouth 2 (two) times daily before a meal.   polyethylene glycol 17 g packet Commonly known as: MIRALAX / GLYCOLAX Take 17 g by mouth daily as needed for mild constipation (MIX AS DIRECTED AND DRINK).   potassium chloride 20 MEQ packet Commonly known as: KLOR-CON Take 20 mEq by mouth daily. Start taking on: Mar 14, 2021   SYSTANE OP Place 1 drop into both eyes 3 (three) times daily as needed (dryness).   torsemide 20 MG tablet Commonly known as: DEMADEX Take 20 mg by mouth in the morning.   vitamin B-12 1000 MCG tablet Commonly known as: CYANOCOBALAMIN Take 1 tablet (1,000 mcg total) by mouth daily.   Vitamin D3 50 MCG (2000 UT) Tabs Take 2,000 Units by mouth daily.       Follow-up Information    Drema Dallas,  Benjamine Mola, MD Follow up in 2 week(s).   Specialty: Family Medicine Contact information: [place] [address] [phone]              Allergies  Allergen Reactions  . Epoetin Alfa Nausea Only and Other (See Comments)    Procrit and Epogen: "Chills, low grade fever, body aches, fatigue, nausea for 3 days"   . Penicillins Itching and Rash    Amoxicillin ok- IM pen is what gives reaction  Did it involve swelling of the face/tongue/throat, SOB, or low BP? No Did it involve sudden or severe rash/hives, skin peeling, or any reaction on the inside of your mouth or nose? No Did you need to seek medical attention at a hospital or doctor's office? No When did it last happen?10 + year If all above answers are "NO", may proceed with cephalosporin use.  Other reaction(s): rash, itching  . Retacrit [Epoetin (Alfa)] Other (See Comments)    Chills, low grade fever, body aches, fatigue, nausea for 3 days.  . Crestor [Rosuvastatin] Nausea Only  . Hydrocodone Itching and Other (See Comments)    And the patient "got mean"  . Influenza Virus Vaccine Split Swelling and Other (See  Comments)    Local swelling and fever  . Tape Other (See Comments)    The skin tears easily  . Cefaclor Itching  . Crestor [Rosuvastatin Calcium] Nausea Only  . Influenza Vaccines Swelling and Other (See Comments)    Swelling and redness at injection site, fever  . Lidocaine Hcl Palpitations and Other (See Comments)    Heart racing if Lidocaine is with EPI  . Nsaids     Pt states she is not taking because of kidney function   . Percocet [Oxycodone-Acetaminophen] Nausea And Vomiting  . Prednisone Other (See Comments)    Dizziness, spiked blood sugar     Consultations:  Gastroenterology  Subjective: Afebrile - VSS - sats 100% on RA   Discharge Exam: Vitals:   03/12/21 2113 03/13/21 0524  BP: (!) 134/52 (!) 118/46  Pulse: 70 68  Resp: 18 17  Temp: 99.6 F (37.6 C) 98.3 F (36.8 C)  SpO2: 99% 98%   Vitals:   03/12/21 1324 03/12/21 1809 03/12/21 2113 03/13/21 0524  BP: 135/79 (!) 129/48 (!) 134/52 (!) 118/46  Pulse: 70 68 70 68  Resp: 18  18 17   Temp: 97.6 F (36.4 C)  99.6 F (37.6 C) 98.3 F (36.8 C)  TempSrc: Oral  Oral Oral  SpO2: 98% 99% 99% 98%  Weight:      Height:        General: No acute respiratory distress Lungs: Clear to auscultation bilaterally without wheezes or crackles Cardiovascular: Regular rate without murmur  Abdomen: Nontender, nondistended, soft, bowel sounds positive Extremities: No significant edema bilateral lower extremities   Time coordinating discharge:  35 minutes  SIGNED:   Cherene Altes, MD  [facility] 03/13/2021, 11:43 AM

## 2021-03-13 NOTE — Care Management Important Message (Signed)
Important Message  Patient Details  Name: Krista Mason MRN: [id number] Date of Birth: [birth year]   Medicare Important Message Given:  Yes     Hyde Sires P Reynolds 03/13/2021, 2:36 PM

## 2021-03-13 NOTE — Progress Notes (Incomplete)
Krista Mason  EHU:[id number] DOB: Feb 07, 1936 DOA: 02/27/2021 PCP: Leighton Ruff, MD    Brief Narrative:  [phone] with a history of chronic systolic CHF, atrial fibrillation on Eliquis, GERD, CKD stage IIIb, HTN, HLD, and COPD who was admitted with crampy abdominal pain and dark stools.  She had also been diagnosed with COVID in the outpatient setting as per a home test 02/22/2021.  At the time of her admission she was found to be experiencing a GI bleed.  Consultants:  Gastroenterology  Code Status: FULL CODE  Antimicrobials:  None  DVT prophylaxis: SCDs  Subjective: Hospitalization prolonged due to delay in insurance authorization. Medically stable for d/c since 5/19 (actually 5/17 but CoViD issues extended to 5/19).   Afebrile.  Vitals are stable.  Remains medically stable for discharge to SNF for rehab stay.  Assessment & Plan:  Severe distal ulcerative esophageal stricture Noted on EGD 5/10 -status post dilatation -clinically stable at this time  GI bleed -anemia of acute blood loss Baseline hemoglobin approximately 10.5 -hemoglobin nadir 7.9 -dosed with Feraheme 5/11 -to resume Eliquis today after having held it for 2 weeks - continue high-dose Protonix and Carafate -dosed with her usual dose of Aranesp during this admission -hemoglobin stable/improving  Mildly symptomatic COVID infection CXR without significant infiltrate -afebrile with no hypoxia -dosed with 3-day course of Remdesivir given high risk status -clinically stable - home test +5/4 but documented positive test in hospital 5/9 -does not require isolation at our facility but SNF would not accept her until 5/19  Hypokalemia Due to poor intake and diuretic use -corrected with supplementation -continue scheduled supplement with ongoing use of diuretic  Chronic systolic CHF - s/p CRT-P pacemaker insertion  Euvolemic on usual torsemide dose - followed in CHF clinic   CKD stage IIIb Baseline creatinine approximately  2 - stable at this time  HTN Blood pressure presently well controlled  Paroxysmal atrial fib Rate controlled - Eliquis on hold until 5/23   Family Communication:  Status is: Inpatient  Remains inpatient appropriate because:Unsafe d/c plan   Dispo: The patient is from: Home              Anticipated d/c is to: SNF              Patient currently is medically stable to d/c.   Difficult to place patient No    Objective: Blood pressure (!) 118/46, pulse 68, temperature 98.3 F (36.8 C), temperature source Oral, resp. rate 17, height 5\' 5"  (1.651 m), weight 66.8 kg, SpO2 98 %.  Intake/Output Summary (Last 24 hours) at 03/13/2021 0927 Last data filed at 03/12/2021 1324 Gross per 24 hour  Intake 240 ml  Output -  Net 240 ml   Filed Weights   02/27/21 1302 02/27/21 2015 02/28/21 1331  Weight: 68 kg 66.8 kg 66.8 kg    Examination: General: No acute respiratory distress Lungs: Clear to auscultation bilaterally without wheezes or crackles Cardiovascular: Regular rate and rhythm without murmur  Abdomen: NT'ND, soft  Extremities: No edema bilateral lower extremities   CBC: Recent Labs  Lab 03/10/21 0218 03/12/21 0218  WBC 8.9 7.9  HGB 8.1* 8.5*  HCT 25.3* 26.8*  MCV 90.7 92.1  PLT 174 209    HbA1C: Hgb A1c MFr Bld  Date/Time Value Ref Range Status  06/11/2019 12:06 PM 6.9 (H) 4.8 - 5.6 % Final    Comment:    (NOTE) Pre diabetes:          5.7%-6.4%  Diabetes:              >6.4% Glycemic control for   <7.0% adults with diabetes   11/05/2016 08:09 AM 6.2 (H) 4.8 - 5.6 % Final    Comment:    (NOTE)         Pre-diabetes: 5.7 - 6.4         Diabetes: >6.4         Glycemic control for adults with diabetes: <7.0     No results found for this or any previous visit (from the past 240 hour(s)).   Scheduled Meds: . amiodarone  100 mg Oral Q48H  . atorvastatin  20 mg Oral Daily  . carvedilol  12.5 mg Oral BID WC  . feeding supplement  237 mL Oral BID BM  .  hydrALAZINE  50 mg Oral BID  . pantoprazole  40 mg Oral BID  . potassium chloride  40 mEq Oral Daily  . sucralfate  1 g Oral TID WC & HS  . torsemide  20 mg Oral Daily     LOS: 14 days   Cherene Altes, MD [facility] Office  [phone] Pager - Text Page per Amion  If 7PM-7AM, please contact night-coverage per Amion 03/13/2021, 9:27 AM

## 2021-03-13 NOTE — Discharge Instructions (Signed)

## 2021-03-14 DIAGNOSIS — M2550 Pain in unspecified joint: Secondary | ICD-10-CM | POA: Diagnosis not present

## 2021-03-14 DIAGNOSIS — D472 Monoclonal gammopathy: Secondary | ICD-10-CM | POA: Diagnosis not present

## 2021-03-14 DIAGNOSIS — N184 Chronic kidney disease, stage 4 (severe): Secondary | ICD-10-CM | POA: Diagnosis not present

## 2021-03-14 DIAGNOSIS — R000 Tachycardia, unspecified: Secondary | ICD-10-CM | POA: Diagnosis not present

## 2021-03-14 DIAGNOSIS — R531 Weakness: Secondary | ICD-10-CM | POA: Diagnosis not present

## 2021-03-14 DIAGNOSIS — I340 Nonrheumatic mitral (valve) insufficiency: Secondary | ICD-10-CM | POA: Diagnosis not present

## 2021-03-14 DIAGNOSIS — N2581 Secondary hyperparathyroidism of renal origin: Secondary | ICD-10-CM | POA: Diagnosis not present

## 2021-03-14 DIAGNOSIS — K922 Gastrointestinal hemorrhage, unspecified: Secondary | ICD-10-CM | POA: Diagnosis not present

## 2021-03-14 DIAGNOSIS — E785 Hyperlipidemia, unspecified: Secondary | ICD-10-CM | POA: Diagnosis not present

## 2021-03-14 DIAGNOSIS — Z7901 Long term (current) use of anticoagulants: Secondary | ICD-10-CM | POA: Diagnosis not present

## 2021-03-14 DIAGNOSIS — R52 Pain, unspecified: Secondary | ICD-10-CM | POA: Diagnosis not present

## 2021-03-14 DIAGNOSIS — I447 Left bundle-branch block, unspecified: Secondary | ICD-10-CM | POA: Diagnosis not present

## 2021-03-14 DIAGNOSIS — Z87891 Personal history of nicotine dependence: Secondary | ICD-10-CM | POA: Diagnosis not present

## 2021-03-14 DIAGNOSIS — Z4502 Encounter for adjustment and management of automatic implantable cardiac defibrillator: Secondary | ICD-10-CM | POA: Diagnosis not present

## 2021-03-14 DIAGNOSIS — I959 Hypotension, unspecified: Secondary | ICD-10-CM | POA: Diagnosis not present

## 2021-03-14 DIAGNOSIS — D631 Anemia in chronic kidney disease: Secondary | ICD-10-CM | POA: Diagnosis not present

## 2021-03-14 DIAGNOSIS — I4891 Unspecified atrial fibrillation: Secondary | ICD-10-CM | POA: Diagnosis not present

## 2021-03-14 DIAGNOSIS — Z8249 Family history of ischemic heart disease and other diseases of the circulatory system: Secondary | ICD-10-CM | POA: Diagnosis not present

## 2021-03-14 DIAGNOSIS — R0602 Shortness of breath: Secondary | ICD-10-CM | POA: Diagnosis not present

## 2021-03-14 DIAGNOSIS — R942 Abnormal results of pulmonary function studies: Secondary | ICD-10-CM | POA: Diagnosis not present

## 2021-03-14 DIAGNOSIS — R0989 Other specified symptoms and signs involving the circulatory and respiratory systems: Secondary | ICD-10-CM | POA: Diagnosis not present

## 2021-03-14 DIAGNOSIS — N182 Chronic kidney disease, stage 2 (mild): Secondary | ICD-10-CM | POA: Diagnosis not present

## 2021-03-14 DIAGNOSIS — I471 Supraventricular tachycardia: Secondary | ICD-10-CM | POA: Diagnosis not present

## 2021-03-14 DIAGNOSIS — I129 Hypertensive chronic kidney disease with stage 1 through stage 4 chronic kidney disease, or unspecified chronic kidney disease: Secondary | ICD-10-CM | POA: Diagnosis not present

## 2021-03-14 DIAGNOSIS — Z7401 Bed confinement status: Secondary | ICD-10-CM | POA: Diagnosis not present

## 2021-03-14 DIAGNOSIS — D500 Iron deficiency anemia secondary to blood loss (chronic): Secondary | ICD-10-CM | POA: Diagnosis not present

## 2021-03-14 DIAGNOSIS — I5022 Chronic systolic (congestive) heart failure: Secondary | ICD-10-CM | POA: Diagnosis not present

## 2021-03-14 DIAGNOSIS — I428 Other cardiomyopathies: Secondary | ICD-10-CM | POA: Diagnosis not present

## 2021-03-14 DIAGNOSIS — E559 Vitamin D deficiency, unspecified: Secondary | ICD-10-CM | POA: Diagnosis not present

## 2021-03-14 DIAGNOSIS — Z79899 Other long term (current) drug therapy: Secondary | ICD-10-CM | POA: Diagnosis not present

## 2021-03-14 DIAGNOSIS — K219 Gastro-esophageal reflux disease without esophagitis: Secondary | ICD-10-CM | POA: Diagnosis not present

## 2021-03-14 DIAGNOSIS — J449 Chronic obstructive pulmonary disease, unspecified: Secondary | ICD-10-CM | POA: Diagnosis not present

## 2021-03-14 DIAGNOSIS — E118 Type 2 diabetes mellitus with unspecified complications: Secondary | ICD-10-CM | POA: Diagnosis not present

## 2021-03-14 DIAGNOSIS — E1165 Type 2 diabetes mellitus with hyperglycemia: Secondary | ICD-10-CM | POA: Diagnosis not present

## 2021-03-14 DIAGNOSIS — I493 Ventricular premature depolarization: Secondary | ICD-10-CM | POA: Diagnosis not present

## 2021-03-14 DIAGNOSIS — I5042 Chronic combined systolic (congestive) and diastolic (congestive) heart failure: Secondary | ICD-10-CM | POA: Diagnosis not present

## 2021-03-14 DIAGNOSIS — I739 Peripheral vascular disease, unspecified: Secondary | ICD-10-CM | POA: Diagnosis not present

## 2021-03-14 DIAGNOSIS — I130 Hypertensive heart and chronic kidney disease with heart failure and stage 1 through stage 4 chronic kidney disease, or unspecified chronic kidney disease: Secondary | ICD-10-CM | POA: Diagnosis not present

## 2021-03-14 DIAGNOSIS — Z8616 Personal history of COVID-19: Secondary | ICD-10-CM | POA: Diagnosis not present

## 2021-03-14 DIAGNOSIS — R5381 Other malaise: Secondary | ICD-10-CM | POA: Diagnosis not present

## 2021-03-14 DIAGNOSIS — J9601 Acute respiratory failure with hypoxia: Secondary | ICD-10-CM | POA: Diagnosis not present

## 2021-03-14 DIAGNOSIS — J40 Bronchitis, not specified as acute or chronic: Secondary | ICD-10-CM | POA: Diagnosis not present

## 2021-03-14 DIAGNOSIS — N183 Chronic kidney disease, stage 3 unspecified: Secondary | ICD-10-CM | POA: Diagnosis not present

## 2021-03-14 DIAGNOSIS — I2510 Atherosclerotic heart disease of native coronary artery without angina pectoris: Secondary | ICD-10-CM | POA: Diagnosis not present

## 2021-03-14 DIAGNOSIS — D649 Anemia, unspecified: Secondary | ICD-10-CM | POA: Diagnosis not present

## 2021-03-14 DIAGNOSIS — E119 Type 2 diabetes mellitus without complications: Secondary | ICD-10-CM | POA: Diagnosis not present

## 2021-03-14 DIAGNOSIS — R059 Cough, unspecified: Secondary | ICD-10-CM | POA: Diagnosis not present

## 2021-03-14 DIAGNOSIS — I480 Paroxysmal atrial fibrillation: Secondary | ICD-10-CM | POA: Diagnosis not present

## 2021-03-14 DIAGNOSIS — Z743 Need for continuous supervision: Secondary | ICD-10-CM | POA: Diagnosis not present

## 2021-03-14 DIAGNOSIS — E1122 Type 2 diabetes mellitus with diabetic chronic kidney disease: Secondary | ICD-10-CM | POA: Diagnosis not present

## 2021-03-14 DIAGNOSIS — I10 Essential (primary) hypertension: Secondary | ICD-10-CM | POA: Diagnosis not present

## 2021-03-14 DIAGNOSIS — I509 Heart failure, unspecified: Secondary | ICD-10-CM | POA: Diagnosis not present

## 2021-03-14 DIAGNOSIS — K2210 Ulcer of esophagus without bleeding: Secondary | ICD-10-CM | POA: Diagnosis not present

## 2021-03-14 DIAGNOSIS — D509 Iron deficiency anemia, unspecified: Secondary | ICD-10-CM | POA: Diagnosis not present

## 2021-03-14 DIAGNOSIS — K5900 Constipation, unspecified: Secondary | ICD-10-CM | POA: Diagnosis not present

## 2021-03-14 MED ORDER — SUCRALFATE 1 GM/10ML PO SUSP: 1.0000 g | Freq: Three times a day (TID) | ORAL | 0 refills | Status: DC

## 2021-03-14 MED ORDER — PANTOPRAZOLE SODIUM 40 MG PO TBEC: 40.0000 mg | DELAYED_RELEASE_TABLET | Freq: Two times a day (BID) | ORAL | 0 refills | Status: DC

## 2021-03-14 NOTE — Progress Notes (Signed)
Report called to [place] with [place], RN Patient given discharge instructions and verbalized understanding.

## 2021-03-14 NOTE — TOC Transition Note (Signed)
Transition of Care [facility]) - CM/SW Discharge Note   Patient Details  Name: Krista Mason MRN: [id number] Date of Birth: 04-17-1936  [facility] of [facility]) CM/SW Contact:  Emeterio Reeve, [place] Phone Number: 03/14/2021, 10:55 AM   Clinical Narrative:     Patient will DC to: [facility] Anticipated DC date: 03/14/21 Family notified: Daughter, Engineer, drilling by: Corey Harold     Per MD patient ready for DC to [place]. RN, patient, patient's family, and facility notified of DC. Discharge Summary and FL2 sent to facility. DC packet on chart. Ambulance transport requested for patient.    RN to call report to [phone].  CSW will sign off for now as social work intervention is no longer needed. Please consult [place] again if new needs arise.   Final next level of care: Skilled Nursing Facility Barriers to Discharge: Barriers Resolved   Patient Goals and CMS Choice Patient states their goals for this hospitalization and ongoing recovery are:: Would like to return home if possible but states she thinks she will need SNF   Choice offered to / list presented to : Patient  Discharge Placement              Patient chooses bed at: Other - please specify in the comment section below: ([facility]) Patient to be transferred to facility by: ptar Name of family member notified: daughter Davy Pique Patient and family notified of of transfer: 03/14/21  Discharge Plan and Services                                     Social Determinants of Health (SDOH) Interventions     Readmission Risk Interventions No flowsheet data found.   Emeterio Reeve, Latanya Presser, [place] Social Worker [phone]

## 2021-03-14 NOTE — Progress Notes (Signed)
Patient discharged to SNF [facility] on stretcher with all belongings

## 2021-03-14 NOTE — Discharge Summary (Addendum)
Physician Discharge Summary    Krista Mason BWG:[id number] DOB: 11-Mar-1936 DOA: 02/27/2021  PCP: Leighton Ruff, MD  Admit date: 02/27/2021 Discharge date: 03/14/2021   Date of D/C updated to 03/14/21 due to delay in d/c solely due to insurance company delay in authorization. There were NO new medical issues which required this prolongation of her hospital stay.   Admitted From: Home Disposition: Skilled nursing facility  Recommendations for Outpatient Follow-up:  1. Follow up with PCP 1-2 weeks after discharge from SNF 2. Please obtain BMP/CBC in 3-5 days  3. Monitor for blood loss w/ Eliquis resumed 5/23 4. Lower dose of Protonix after 6 weeks of high dose tx.  5.   If patient has continued symptoms of dysphagia, recommend EGD as an outpatient in 6 to 8 weeks, at that point if esophageal ulcers and esophagitis have improved, she will benefit from balloon dilatation.   Discharge Condition: Stable CODE STATUS: DNR / NO CODE BLUE  Diet recommendation: Low-salt diet  Discharge summary: 85yo with a hx of chronic systolic CHF, atrial fibrillation on Eliquis, GERD, CKD stage IIIb, HTN, HLD, and COPD who was admitted with crampy abdominal pain and dark stools.  She had also been diagnosed with COVID in the outpatient setting as per a home test 02/22/2021.  At the time of her admission she was found to be experiencing a GI bleed.  Assessment & Plan of care:    Acute on chronic anemia secondary to upper GI bleeding: EGD 5/10 per Dr. Michail Sermon noted severe distal ulcerated esophagitis w/ a stricture.   Baseline hemoglobin 10-11.  Presented with hemoglobin of 8.9, and reached a nadir of 7.9. Received 1 dose of Feraheme 5/11 and usual monthly dose of Aranesp 5/16. Hemoglobin is stable at time of d/c.   As per GI recommendation, we held Eliquis for 2 weeks. Eliquis was resumed on 5/23. Soft diet and outpatient follow-up. Currently on high-dose Protonix which is to continue for a 6 week course  before transitioning to daily dosing. To complete a 6 week course of Carafate as well. If patient has continued symptoms of dysphagia, recommend EGD as an outpatient in 6 to 8 weeks, at that point if esophageal ulcers and esophagitis have improved, she will benefit from balloon dilatation.  COVID infection: No significant infiltrate on x-ray.  No hypoxia - no fever.  Patient was given 3days IV remdesivir because of high risk factos and mild symptoms with chronic morbidities. Was diagnosed at home on 5/4.  No more isolation needed. No persisting sx at time of d/c.   Chronic systolic heart failure: Euvolemic.  On torsemide. Monitor K+ w/ ongiong diuretic use.   CKD stage IIIb:  Fairly stable at about her baseline.  Her baseline creatinine is about 2.  She follows up with Nephrology as outpatient.  Essential hypertension: Stable on medications.  Paroxysmal A. Fib: Rate controlled.  Eliquis held for 2 weeks as noted above.  Tolerating Coreg and low-dose amiodarone.  Continue to work with PT OT.    Transfer to skilled nursing rehab.  Patient is stable.   Discharge Diagnoses:  Severe distal ulcerative esophagitis w/ stricture GI bleed - melena  Anemia of acute blood loss Mildly symptomatic COVID infection Hypokalemia Chronic systolic CHF - s/p CRT-P pacemaker insertion  CKD stage IIIb HTN Paroxysmal atrial fib NO CODE BLUE - DNR   Discharge Instructions  Discharge Instructions    Diet - low sodium heart healthy   Complete by: As directed  Discharge instructions   Complete by: As directed    Resume taking Eliquis on 5/23   Increase activity slowly   Complete by: As directed      Allergies as of 03/14/2021      Reactions   Epoetin Alfa Nausea Only, Other (See Comments)   Procrit and Epogen: "Chills, low grade fever, body aches, fatigue, nausea for 3 days"   Penicillins Itching, Rash   Amoxicillin ok- IM pen is what gives reaction Did it involve swelling of the  face/tongue/throat, SOB, or low BP? No Did it involve sudden or severe rash/hives, skin peeling, or any reaction on the inside of your mouth or nose? No Did you need to seek medical attention at a hospital or doctor's office? No When did it last happen?10 + year If all above answers are "NO", may proceed with cephalosporin use. Other reaction(s): rash, itching   Retacrit [epoetin (alfa)] Other (See Comments)   Chills, low grade fever, body aches, fatigue, nausea for 3 days.   Crestor [rosuvastatin] Nausea Only   Hydrocodone Itching, Other (See Comments)   And the patient "got mean"   Influenza Virus Vaccine Split Swelling, Other (See Comments)   Local swelling and fever   Tape Other (See Comments)   The skin tears easily   Cefaclor Itching   Crestor [rosuvastatin Calcium] Nausea Only   Influenza Vaccines Swelling, Other (See Comments)   Swelling and redness at injection site, fever   Lidocaine Hcl Palpitations, Other (See Comments)   Heart racing if Lidocaine is with EPI   Nsaids    Pt states she is not taking because of kidney function   Percocet [oxycodone-acetaminophen] Nausea And Vomiting   Prednisone Other (See Comments)   Dizziness, spiked blood sugar      Medication List    TAKE these medications   acetaminophen 500 MG tablet Commonly known as: TYLENOL Take 1,000 mg by mouth every 6 (six) hours as needed for moderate pain, headache or mild pain.   amiodarone 100 MG tablet Commonly known as: PACERONE Take 100 mg by mouth every other day. What changed: Another medication with the same name was removed. Continue taking this medication, and follow the directions you see here.   atorvastatin 20 MG tablet Commonly known as: LIPITOR Take 1 tablet (20 mg total) by mouth at bedtime.   carvedilol 12.5 MG tablet Commonly known as: COREG Take 1 tablet (12.5 mg total) by mouth 2 (two) times daily with a meal.   diphenhydrAMINE 25 mg capsule Commonly known as:  BENADRYL Take 25 mg by mouth every 6 (six) hours as needed for allergies.   Eliquis 2.5 MG Tabs tablet Generic drug: apixaban TAKE 1 TABLET BY MOUTH TWICE A DAY What changed: how much to take   feeding supplement Liqd Take 237 mLs by mouth 2 (two) times daily between meals. What changed: when to take this   hydrALAZINE 50 MG tablet Commonly known as: APRESOLINE Take 50 mg by mouth 2 (two) times daily.   imiquimod 5 % cream Commonly known as: ALDARA Apply 1 application topically as needed (as directed, for skin cancer sites).   ondansetron 4 MG tablet Commonly known as: ZOFRAN Take 1 tablet (4 mg total) by mouth every 6 (six) hours as needed for nausea or vomiting.   pantoprazole 40 MG tablet Commonly known as: Protonix Take 1 tablet (40 mg total) by mouth 2 (two) times daily before a meal.   polyethylene glycol 17 g packet Commonly known as:  MIRALAX / GLYCOLAX Take 17 g by mouth daily as needed for mild constipation (MIX AS DIRECTED AND DRINK).   potassium chloride 20 MEQ packet Commonly known as: KLOR-CON Take 20 mEq by mouth daily.   sucralfate 1 GM/10ML suspension Commonly known as: CARAFATE Take 10 mLs (1 g total) by mouth 4 (four) times daily -  with meals and at bedtime.   SYSTANE OP Place 1 drop into both eyes 3 (three) times daily as needed (dryness).   torsemide 20 MG tablet Commonly known as: DEMADEX Take 20 mg by mouth in the morning.   vitamin B-12 1000 MCG tablet Commonly known as: CYANOCOBALAMIN Take 1 tablet (1,000 mcg total) by mouth daily.   Vitamin D3 50 MCG (2000 UT) Tabs Take 2,000 Units by mouth daily.       Contact information for follow-up providers    Leighton Ruff, MD Follow up in 2 week(s).   Specialty: Family Medicine Contact information: [place] [address] [phone]            Contact information for after-discharge care    Destination    [facility] .   Service:  Skilled Nursing Contact information: [place] [address] [phone]                 Allergies  Allergen Reactions  . Epoetin Alfa Nausea Only and Other (See Comments)    Procrit and Epogen: "Chills, low grade fever, body aches, fatigue, nausea for 3 days"   . Penicillins Itching and Rash    Amoxicillin ok- IM pen is what gives reaction  Did it involve swelling of the face/tongue/throat, SOB, or low BP? No Did it involve sudden or severe rash/hives, skin peeling, or any reaction on the inside of your mouth or nose? No Did you need to seek medical attention at a hospital or doctor's office? No When did it last happen?10 + year If all above answers are "NO", may proceed with cephalosporin use.  Other reaction(s): rash, itching  . Retacrit [Epoetin (Alfa)] Other (See Comments)    Chills, low grade fever, body aches, fatigue, nausea for 3 days.  . Crestor [Rosuvastatin] Nausea Only  . Hydrocodone Itching and Other (See Comments)    And the patient "got mean"  . Influenza Virus Vaccine Split Swelling and Other (See Comments)    Local swelling and fever  . Tape Other (See Comments)    The skin tears easily  . Cefaclor Itching  . Crestor [Rosuvastatin Calcium] Nausea Only  . Influenza Vaccines Swelling and Other (See Comments)    Swelling and redness at injection site, fever  . Lidocaine Hcl Palpitations and Other (See Comments)    Heart racing if Lidocaine is with EPI  . Nsaids     Pt states she is not taking because of kidney function   . Percocet [Oxycodone-Acetaminophen] Nausea And Vomiting  . Prednisone Other (See Comments)    Dizziness, spiked blood sugar     Consultations:  Gastroenterology  Subjective: Afebrile - VSS - sats 100% on RA   Discharge Exam: Vitals:   03/14/21 0524 03/14/21 0800  BP: (!) 133/55 (!) 136/56  Pulse: 69 69  Resp: 16 17  Temp: 99.1 F (37.3 C)   SpO2: 99% 100%   Vitals:   03/13/21  1328 03/13/21 2054 03/14/21 0524 03/14/21 0800  BP: (!) 120/55 (!) 128/50 (!) 133/55 (!) 136/56  Pulse: 69 69 69 69  Resp: 16  16 16 17   Temp: 98.4 F (36.9 C) 98.5 F (36.9 C) 99.1 F (37.3 C)   TempSrc: Oral Oral Oral   SpO2: 100% 95% 99% 100%  Weight:      Height:        General: No acute respiratory distress Lungs: Clear to auscultation bilaterally without wheezes or crackles Cardiovascular: Regular rate without murmur  Abdomen: Nontender, nondistended, soft, bowel sounds positive Extremities: No significant edema bilateral lower extremities   Time coordinating discharge:  35 minutes  SIGNED:   Cherene Altes, MD  [facility] 03/14/2021, 11:08 AM

## 2021-03-17 DIAGNOSIS — I4891 Unspecified atrial fibrillation: Secondary | ICD-10-CM | POA: Diagnosis not present

## 2021-03-17 DIAGNOSIS — Z79899 Other long term (current) drug therapy: Secondary | ICD-10-CM | POA: Diagnosis not present

## 2021-03-17 DIAGNOSIS — I10 Essential (primary) hypertension: Secondary | ICD-10-CM | POA: Diagnosis not present

## 2021-03-17 DIAGNOSIS — K5900 Constipation, unspecified: Secondary | ICD-10-CM | POA: Diagnosis not present

## 2021-03-17 DIAGNOSIS — E785 Hyperlipidemia, unspecified: Secondary | ICD-10-CM | POA: Diagnosis not present

## 2021-03-17 DIAGNOSIS — K922 Gastrointestinal hemorrhage, unspecified: Secondary | ICD-10-CM | POA: Diagnosis not present

## 2021-03-17 DIAGNOSIS — D649 Anemia, unspecified: Secondary | ICD-10-CM | POA: Diagnosis not present

## 2021-03-21 DIAGNOSIS — R5381 Other malaise: Secondary | ICD-10-CM | POA: Diagnosis not present

## 2021-03-21 DIAGNOSIS — K922 Gastrointestinal hemorrhage, unspecified: Secondary | ICD-10-CM | POA: Diagnosis not present

## 2021-03-24 DIAGNOSIS — K219 Gastro-esophageal reflux disease without esophagitis: Secondary | ICD-10-CM | POA: Diagnosis not present

## 2021-03-24 DIAGNOSIS — N182 Chronic kidney disease, stage 2 (mild): Secondary | ICD-10-CM | POA: Diagnosis not present

## 2021-03-24 DIAGNOSIS — K922 Gastrointestinal hemorrhage, unspecified: Secondary | ICD-10-CM | POA: Diagnosis not present

## 2021-03-24 DIAGNOSIS — I4891 Unspecified atrial fibrillation: Secondary | ICD-10-CM | POA: Diagnosis not present

## 2021-03-24 DIAGNOSIS — Z79899 Other long term (current) drug therapy: Secondary | ICD-10-CM | POA: Diagnosis not present

## 2021-03-24 DIAGNOSIS — D649 Anemia, unspecified: Secondary | ICD-10-CM | POA: Diagnosis not present

## 2021-03-24 DIAGNOSIS — I509 Heart failure, unspecified: Secondary | ICD-10-CM | POA: Diagnosis not present

## 2021-03-24 DIAGNOSIS — J449 Chronic obstructive pulmonary disease, unspecified: Secondary | ICD-10-CM | POA: Diagnosis not present

## 2021-04-03 DIAGNOSIS — R0989 Other specified symptoms and signs involving the circulatory and respiratory systems: Secondary | ICD-10-CM | POA: Diagnosis not present

## 2021-04-03 DIAGNOSIS — R0602 Shortness of breath: Secondary | ICD-10-CM | POA: Diagnosis not present

## 2021-04-05 DIAGNOSIS — R0602 Shortness of breath: Secondary | ICD-10-CM | POA: Diagnosis not present

## 2021-04-05 DIAGNOSIS — K2210 Ulcer of esophagus without bleeding: Secondary | ICD-10-CM | POA: Diagnosis not present

## 2021-04-05 DIAGNOSIS — D500 Iron deficiency anemia secondary to blood loss (chronic): Secondary | ICD-10-CM | POA: Diagnosis not present

## 2021-04-05 DIAGNOSIS — N184 Chronic kidney disease, stage 4 (severe): Secondary | ICD-10-CM | POA: Diagnosis not present

## 2021-04-05 DIAGNOSIS — E559 Vitamin D deficiency, unspecified: Secondary | ICD-10-CM | POA: Diagnosis not present

## 2021-04-06 ENCOUNTER — Ambulatory Visit (HOSPITAL_COMMUNITY)
Admission: RE | Admit: 2021-04-06 | Discharge: 2021-04-06 | Disposition: A | Discharge status: Home / Self Care | Payer: Medicare HMO | Source: Ambulatory Visit | Attending: Internal Medicine | Admitting: Internal Medicine

## 2021-04-06 ENCOUNTER — Encounter (HOSPITAL_COMMUNITY): Payer: Self-pay | Admitting: Internal Medicine

## 2021-04-06 ENCOUNTER — Other Ambulatory Visit: Payer: Self-pay

## 2021-04-06 VITALS — BP 140/70 | HR 82 | Wt 146.6 lb

## 2021-04-06 DIAGNOSIS — I130 Hypertensive heart and chronic kidney disease with heart failure and stage 1 through stage 4 chronic kidney disease, or unspecified chronic kidney disease: Secondary | ICD-10-CM | POA: Insufficient documentation

## 2021-04-06 DIAGNOSIS — Z8249 Family history of ischemic heart disease and other diseases of the circulatory system: Secondary | ICD-10-CM | POA: Diagnosis not present

## 2021-04-06 DIAGNOSIS — I5022 Chronic systolic (congestive) heart failure: Secondary | ICD-10-CM | POA: Diagnosis not present

## 2021-04-06 DIAGNOSIS — I493 Ventricular premature depolarization: Secondary | ICD-10-CM | POA: Diagnosis not present

## 2021-04-06 DIAGNOSIS — Z87891 Personal history of nicotine dependence: Secondary | ICD-10-CM | POA: Diagnosis not present

## 2021-04-06 DIAGNOSIS — Z79899 Other long term (current) drug therapy: Secondary | ICD-10-CM | POA: Diagnosis not present

## 2021-04-06 DIAGNOSIS — N184 Chronic kidney disease, stage 4 (severe): Secondary | ICD-10-CM | POA: Diagnosis not present

## 2021-04-06 DIAGNOSIS — Z8616 Personal history of COVID-19: Secondary | ICD-10-CM | POA: Insufficient documentation

## 2021-04-06 DIAGNOSIS — D472 Monoclonal gammopathy: Secondary | ICD-10-CM | POA: Insufficient documentation

## 2021-04-06 DIAGNOSIS — I5042 Chronic combined systolic (congestive) and diastolic (congestive) heart failure: Secondary | ICD-10-CM | POA: Insufficient documentation

## 2021-04-06 DIAGNOSIS — I480 Paroxysmal atrial fibrillation: Secondary | ICD-10-CM

## 2021-04-06 DIAGNOSIS — R059 Cough, unspecified: Secondary | ICD-10-CM | POA: Diagnosis not present

## 2021-04-06 DIAGNOSIS — Z4502 Encounter for adjustment and management of automatic implantable cardiac defibrillator: Secondary | ICD-10-CM | POA: Insufficient documentation

## 2021-04-06 DIAGNOSIS — I340 Nonrheumatic mitral (valve) insufficiency: Secondary | ICD-10-CM | POA: Diagnosis not present

## 2021-04-06 DIAGNOSIS — I447 Left bundle-branch block, unspecified: Secondary | ICD-10-CM | POA: Diagnosis not present

## 2021-04-06 DIAGNOSIS — Z7901 Long term (current) use of anticoagulants: Secondary | ICD-10-CM | POA: Insufficient documentation

## 2021-04-06 DIAGNOSIS — I2510 Atherosclerotic heart disease of native coronary artery without angina pectoris: Secondary | ICD-10-CM | POA: Diagnosis not present

## 2021-04-06 DIAGNOSIS — E785 Hyperlipidemia, unspecified: Secondary | ICD-10-CM | POA: Diagnosis not present

## 2021-04-06 DIAGNOSIS — E1122 Type 2 diabetes mellitus with diabetic chronic kidney disease: Secondary | ICD-10-CM | POA: Insufficient documentation

## 2021-04-06 DIAGNOSIS — I428 Other cardiomyopathies: Secondary | ICD-10-CM | POA: Insufficient documentation

## 2021-04-06 NOTE — Progress Notes (Signed)
ReDS Vest / Clip - 04/06/21 1100       ReDS Vest / Clip   Station Marker A    Ruler Value 26.5    ReDS Value Range Low volume    ReDS Actual Value 28

## 2021-04-06 NOTE — Patient Instructions (Addendum)
No Labs done today.   INCREASE Torsemide to 40mg  (2 tablets) by mouth for one day only then decrease back down to 20mg  by mouth daily.   INCREASE Potassium to 31meq(2 tablets) by mouth for one day only then decrease by down to 8meq(1 tablet) by mouth daily.   No other medication changes were made. Please continue all current medications as prescribed.  Your physician recommends that you schedule a follow-up appointment in: 6 months with an echo prior to your exam. Please contact our office in November to schedule a December appointment.   If you have any questions or concerns before your next appointment please send [place] a message through [place] or call our office at [phone].    TO LEAVE A MESSAGE FOR THE NURSE SELECT OPTION 2, PLEASE LEAVE A MESSAGE INCLUDING: YOUR NAME DATE OF BIRTH CALL BACK NUMBER REASON FOR CALL**this is important as we prioritize the call backs  YOU WILL RECEIVE A CALL BACK THE SAME DAY AS [place] AS YOU CALL BEFORE 4:00 PM   Do the following things EVERYDAY: Weigh yourself in the morning before breakfast. Write it down and keep it in a log. Take your medicines as prescribed Eat low salt foods--Limit salt (sodium) to 2000 mg per day.  Stay as active as you can everyday Limit all fluids for the day to less than 2 liters   At the [facility], you and your health needs are our priority. As part of our continuing mission to provide you with exceptional heart care, we have created designated Provider Care Teams. These Care Teams include your primary Cardiologist (physician) and Advanced Practice Providers (APPs- Physician Assistants and Nurse Practitioners) who all work together to provide you with the care you need, when you need it.   You may see any of the following providers on your designated Care Team at your next follow up: Dr Glori Bickers Dr Haynes Kerns, NP Lyda Jester, [place] Audry Riles, PharmD   Please be sure  to bring in all your medications bottles to every appointment.

## 2021-04-06 NOTE — Progress Notes (Signed)
Advanced Heart Failure Clinic Note    Primary Cardiologist: Dr Vaughan Browner Nephrologist: Dr Hollie Salk  HPI: Krista Mason is a 85 y.o. female with a history of HTN, HL, CKD 3, moderate mitral regurgitation, DM, and chronic systolic heart failure .  Admitted 4/19 with dyspnea. ECHO had gone down to 35-40% (previously 45%). Had RHC with normal filling pressures and cardiac output. Creatinine peaked at 2.1 so lasix was held for few days after discharge. Myeloma panel - 0.3 M protein noted. PYP scan was negative for TTR. Has seen Dr. Beryle Beams and not felt to have myeloma.   Has had CPX test 5/209 which showed - submax test with moderate to severe functional limitation due to HF, lung disease and deconditioning.    Echo 3/20 EF 35-40% moderate MR. RV ok Personally reviewed  Had GI bleed in 10/20. EGD with grade D esophagitis and stricture  Underwent MDT CRT-D on 07/24/19 subsequently found to have episodes of AF.  Amiodarone started.   Echo 3/21 EF 45-50% Personally reviewed  Diagnosed with Covid in early May. Admitted to hospital in 5/22 with acute on chronic anemia secondary to upper GI bleeding: EGD 02/28/21 per Dr. Michail Sermon noted severe distal ulcerated esophagitis w/ a stricture.  Eliquis held for 2 weeks. Discharged to SNF. Remains at [facility].   Returns for f/u. Remains at [facility]. Working with PT but going slowly due to weakness and persistent severe SOB. Continues with severe non-productive cough. Had CXR on Monday which was read as "mild congestion". Taking torsemide 20 daily with good response. Denies orthopnea or PND. SOB mostly exertional.    ICD interrogation today: Volume mildly elevated. No VT/AF 100% BiV pacing. Activity level < 1hr Personally reviewed    Cardiac studies:   5/19 CPX FVC 1.97 (73%)      FEV1 1.24 (61%)        FEV1/FVC 63 (81%)        MVV 49 (59%)       Resting HR: 58 Peak HR: 110   (78% age predicted max HR) BP rest: 152/66 BP peak: 214/70 Peak  VO2: 9.6 (57% predicted peak VO2) VE/VCO2 slope:  50 OUES: 0.87 Peak RER: 0.93 VE/MVV:  70% O2pulse:  6   (75% predicted O2pulse)   01/2018 PYP scan negative for TTR  RHC 01/2018  RA = 3 RV = 29/3 PA = 25/12 (16) PCW = 8 Fick cardiac output/index = 5.3/3.0 PVR = 1.5 WU Ao sat = 97% PA sat = 65%, 67%  Assessment: 1. Normal filling pressures and outputs  LHC 01/2017  Heavily calcified but non-obstructive CAD. 50% RCA and 30% LAD.    ECHO 01/2018  EF 35%  Severe concentric LVH with diffuse hypokinesis and paradoxical   septal motion. Thickened aortic and mitral valve leaflets. Mild   aortic regurgitation and moderate mitral regurgitation. Trivial   posteriorly located pericardial effusion.   Evaluation for cardiac amyloidosis should be considered .  ECHO 2018, the LVEF is improved from   35-40% up to 40-45%. There is persistent moderate mitral   regurgitation.  Review of systems complete and found to be negative unless listed in HPI.    SH:  Social History   Socioeconomic History   Marital status: Widowed    Spouse name: Not on file   Number of children: Not on file   Years of education: Not on file   Highest education level: Not on file  Occupational History   Not on file  Tobacco Use  Smoking status: Former    Packs/day: 0.75    Years: 60.00    Pack years: 45.00    Types: Cigarettes    Quit date: 11/04/2016    Years since quitting: 4.4   Smokeless tobacco: Never  Vaping Use   Vaping Use: Never used  Substance and Sexual Activity   Alcohol use: No   Drug use: No   Sexual activity: Not on file  Other Topics Concern   Not on file  Social History Narrative   Lives at [facility].   Social Determinants of Health   Financial Resource Strain: Not on file  Food Insecurity: Not on file  Transportation Needs: Not on file  Physical Activity: Not on file  Stress: Not on file  Social Connections: Not on file  Intimate Partner Violence: Not  on file    FH:  Family History  Problem Relation Age of Onset   Heart attack Father    Heart disease Father    Cancer Father    Hypertension Father    Hypertension Brother    Stroke Brother    Hypertension Brother    Diabetes Son    Hyperlipidemia Son    Hypertension Son     Past Medical History:  Diagnosis Date   Anemia    Atrial fibrillation ([place])    Chronic combined systolic and diastolic CHF (congestive heart failure) ([place])    a. LV dysfcuntion felt to be related to PVCs   Chronic kidney disease    Diabetes mellitus without complication (HCC)    GERD (gastroesophageal reflux disease)    Hx of echocardiogram    Echo (1/16):  EF 60-65%, no RWMA, Gr 1 DD, mod AI, mod MR, mild LAE   Hyperkalemia    Hyperlipidemia    Hypertension    NICM (nonischemic cardiomyopathy) ([place])    a. 12/2018 Echo: EF 35%, diff HK w/ septal-lateral dyssynchrony. Nl RV fxn; b. 07/2019 s/p MDT [id number] [name] CRT-P MRI SureScan device (ser #: [id number])   Orthostatic hypotension    PVC (premature ventricular contraction)    a. s/p PVC ablation in 11/2016   Rheumatic fever    Wears dentures     Current Outpatient Medications  Medication Sig Dispense Refill   acetaminophen (TYLENOL) 500 MG tablet Take 1,000 mg by mouth every 6 (six) hours as needed for moderate pain, headache or mild pain.     amiodarone (PACERONE) 100 MG tablet Take 100 mg by mouth every other day.     atorvastatin (LIPITOR) 20 MG tablet Take 1 tablet (20 mg total) by mouth at bedtime. 90 tablet 3   carvedilol (COREG) 12.5 MG tablet Take 1 tablet (12.5 mg total) by mouth 2 (two) times daily with a meal. 60 tablet 11   Cholecalciferol (VITAMIN D3) 50 MCG (2000 UT) TABS Take 2,000 Units by mouth daily.     Dextromethorphan-guaiFENesin (ROBITUSSIN DM PO) Take 5 mLs by mouth at bedtime.     diphenhydrAMINE (BENADRYL) 25 mg capsule Take 25 mg by mouth every 6 (six) hours as needed for allergies.      ELIQUIS 2.5 MG TABS tablet  TAKE 1 TABLET BY MOUTH TWICE A DAY 60 tablet 6   feeding supplement, ENSURE ENLIVE, (ENSURE ENLIVE) LIQD Take 237 mLs by mouth 2 (two) times daily between meals. 237 mL 12   guaiFENesin (MUCINEX) 600 MG 12 hr tablet Take 600 mg by mouth 2 (two) times daily.     hydrALAZINE (APRESOLINE) 50 MG tablet  Take 50 mg by mouth 2 (two) times daily.     imiquimod (ALDARA) 5 % cream Apply 1 application topically as needed (as directed, for skin cancer sites).     omeprazole (PRILOSEC) 20 MG capsule Take 20 mg by mouth 2 (two) times daily before a meal.     pantoprazole (PROTONIX) 40 MG tablet Take 1 tablet (40 mg total) by mouth 2 (two) times daily before a meal. 58 tablet 0   Polyethyl Glycol-Propyl Glycol (SYSTANE OP) Place 1 drop into both eyes 3 (three) times daily as needed (dryness).      polyethylene glycol (MIRALAX / GLYCOLAX) 17 g packet Take 17 g by mouth daily as needed for mild constipation (MIX AS DIRECTED AND DRINK).     potassium chloride (KLOR-CON) 20 MEQ packet Take 20 mEq by mouth daily.     sucralfate (CARAFATE) 1 GM/10ML suspension Take 10 mLs (1 g total) by mouth 4 (four) times daily -  with meals and at bedtime. 1160 mL 0   torsemide (DEMADEX) 20 MG tablet Take 20 mg by mouth in the morning.     umeclidinium-vilanterol (ANORO ELLIPTA) 62.5-25 MCG/INH AEPB Inhale 1 puff into the lungs daily.     vitamin B-12 (CYANOCOBALAMIN) 1000 MCG tablet Take 1 tablet (1,000 mcg total) by mouth daily. 30 tablet 0   No current facility-administered medications for this encounter.    Vitals:   04/06/21 1053  BP: 140/70  Pulse: 82  SpO2: 98%  Weight: 66.5 kg (146 lb 9.6 oz)   Filed Weights   04/06/21 1053  Weight: 66.5 kg (146 lb 9.6 oz)   Wt Readings from Last 3 Encounters:  04/06/21 66.5 kg (146 lb 9.6 oz)  02/28/21 66.8 kg (147 lb 4.3 oz)  12/12/20 69.9 kg (154 lb 3.2 oz)   PHYSICAL EXAM: General:  Elderly. Hacking cough  HEENT: normal Neck: supple. no JVD. Carotids 2+ bilat; no  bruits. No lymphadenopathy or thryomegaly appreciated. Cor: PMI nondisplaced. Regular rate & rhythm. No rubs, gallops or murmurs. Lungs: clear Abdomen: soft, nontender, nondistended. No hepatosplenomegaly. No bruits or masses. Good bowel sounds. Extremities: no cyanosis, clubbing, rash, edema Neuro: alert & orientedx3, cranial nerves grossly intact. moves all 4 extremities w/o difficulty. Affect pleasant    ASSESSMENT & PLAN:  1. Chronic combined CHF: NICM s/p PVC ablation. Now with LBBB. Echo 8/18 EF 40% (unchanged from prior).  - Echo 01/2018 LVEF 35-40%, Moderate MR, LVH. Concern for amyloid.  - PYP scan negative for TTR amyloid. Has MGUS and has seen Dr. Beryle Beams but low suspicion for AL amyloid/myeloma. 24 hour urine with no Mspike - Echo 3/20 EF stable at 35-40% with moderate MR - CPX 5/19 poor efforts RER 0.9 but suggests severe multifactorial limitation but HF predominates. Unfortunately, not candidate for advanced therapies due to age and comorbidities. - s/p CRT-D on 07/24/19 - much improved with CRT. NYHA II-III limited by back pain - Echo 3/21 EF 45-50%  - has severe cough today. Volume up on ICD but REDS 28% suggesting normal lung fluid - ICD interrogation today: Volume up. No AF/VT 100% BiV pacing. Activity level < 1 hr - Suspect volume ok. Given severe cough with double torsemide for 1 day to see if it helps  - continue carvedilol 12.5 bid - continue hydralazine 50 bid - No ACE/ARB/ARNI/spiro/SGLT2i with CKD IV  2. Chest pain - Last cath 01/2017 with heavily calcified but non-obstructive CAD. 50% RCA and 30% LAD.  - No current s/s ischemia  -  CT chest stable with old granulomatous disease. - Continue ASA and statin  3. Severe cough - suspect related to recent Covid infection - will give extra dose of torsemide to see if this helps   3. PVCs - s/p ablation. Off Amiodarone therapy. PVC burden low   4. LBBB - EF 35-40% on echo 3/20 + moderate MR. LBBB ~169ms. -  s/p CRT 07/24/19  - EF 45-50% on echo 3/21.    5. Drop attacks/syncope - No recent occurrences. 30 day monitor unremarkable 10/2017.   6. CKD IV - Baseline creatinine 1.7-1.9. Last value in 1/21 creatinine 2.3 - Following with Dr Hollie Salk  7. HTN - Improved  8. PAD - ABIs showed moderate disease BLE. Has seen [facility]. Medical therapy for now. No change.   9. PAF - quiescent on amio.   10. Recurrent GI bleeding -  EGD 5/10 per Dr. Michail Sermon noted severe distal ulcerated esophagitis w/ a stricture.    Glori Bickers, MD  11:34 AM

## 2021-04-08 ENCOUNTER — Other Ambulatory Visit: Payer: Self-pay | Admitting: Cardiology

## 2021-04-10 DIAGNOSIS — R5381 Other malaise: Secondary | ICD-10-CM | POA: Diagnosis not present

## 2021-04-10 DIAGNOSIS — J40 Bronchitis, not specified as acute or chronic: Secondary | ICD-10-CM | POA: Diagnosis not present

## 2021-04-10 DIAGNOSIS — R0989 Other specified symptoms and signs involving the circulatory and respiratory systems: Secondary | ICD-10-CM | POA: Diagnosis not present

## 2021-04-10 DIAGNOSIS — I509 Heart failure, unspecified: Secondary | ICD-10-CM | POA: Diagnosis not present

## 2021-04-10 DIAGNOSIS — I4891 Unspecified atrial fibrillation: Secondary | ICD-10-CM | POA: Diagnosis not present

## 2021-04-10 DIAGNOSIS — J449 Chronic obstructive pulmonary disease, unspecified: Secondary | ICD-10-CM | POA: Diagnosis not present

## 2021-04-10 DIAGNOSIS — D509 Iron deficiency anemia, unspecified: Secondary | ICD-10-CM | POA: Diagnosis not present

## 2021-04-10 DIAGNOSIS — D631 Anemia in chronic kidney disease: Secondary | ICD-10-CM | POA: Diagnosis not present

## 2021-04-10 DIAGNOSIS — N184 Chronic kidney disease, stage 4 (severe): Secondary | ICD-10-CM | POA: Diagnosis not present

## 2021-04-10 DIAGNOSIS — I10 Essential (primary) hypertension: Secondary | ICD-10-CM | POA: Diagnosis not present

## 2021-04-10 DIAGNOSIS — R059 Cough, unspecified: Secondary | ICD-10-CM | POA: Diagnosis not present

## 2021-04-10 DIAGNOSIS — K2210 Ulcer of esophagus without bleeding: Secondary | ICD-10-CM | POA: Diagnosis not present

## 2021-04-10 DIAGNOSIS — I5022 Chronic systolic (congestive) heart failure: Secondary | ICD-10-CM | POA: Diagnosis not present

## 2021-04-10 DIAGNOSIS — K922 Gastrointestinal hemorrhage, unspecified: Secondary | ICD-10-CM | POA: Diagnosis not present

## 2021-04-10 DIAGNOSIS — N2581 Secondary hyperparathyroidism of renal origin: Secondary | ICD-10-CM | POA: Diagnosis not present

## 2021-04-10 DIAGNOSIS — I129 Hypertensive chronic kidney disease with stage 1 through stage 4 chronic kidney disease, or unspecified chronic kidney disease: Secondary | ICD-10-CM | POA: Diagnosis not present

## 2021-04-12 DIAGNOSIS — R942 Abnormal results of pulmonary function studies: Secondary | ICD-10-CM | POA: Diagnosis not present

## 2021-04-12 DIAGNOSIS — R5381 Other malaise: Secondary | ICD-10-CM | POA: Diagnosis not present

## 2021-04-12 DIAGNOSIS — J449 Chronic obstructive pulmonary disease, unspecified: Secondary | ICD-10-CM | POA: Diagnosis not present

## 2021-04-12 DIAGNOSIS — I4891 Unspecified atrial fibrillation: Secondary | ICD-10-CM | POA: Diagnosis not present

## 2021-04-12 DIAGNOSIS — R059 Cough, unspecified: Secondary | ICD-10-CM | POA: Diagnosis not present

## 2021-04-12 DIAGNOSIS — I509 Heart failure, unspecified: Secondary | ICD-10-CM | POA: Diagnosis not present

## 2021-04-14 ENCOUNTER — Emergency Department (HOSPITAL_COMMUNITY): Payer: Medicare HMO

## 2021-04-14 ENCOUNTER — Other Ambulatory Visit: Payer: Self-pay

## 2021-04-14 ENCOUNTER — Emergency Department (HOSPITAL_COMMUNITY)
Admission: EM | Admit: 2021-04-14 | Discharge: 2021-04-15 | Disposition: A | Discharge status: Home / Self Care | Payer: Medicare HMO | Attending: Emergency Medicine | Admitting: Emergency Medicine

## 2021-04-14 DIAGNOSIS — N183 Chronic kidney disease, stage 3 unspecified: Secondary | ICD-10-CM | POA: Insufficient documentation

## 2021-04-14 DIAGNOSIS — E1122 Type 2 diabetes mellitus with diabetic chronic kidney disease: Secondary | ICD-10-CM | POA: Diagnosis not present

## 2021-04-14 DIAGNOSIS — Z8616 Personal history of COVID-19: Secondary | ICD-10-CM | POA: Diagnosis not present

## 2021-04-14 DIAGNOSIS — J449 Chronic obstructive pulmonary disease, unspecified: Secondary | ICD-10-CM | POA: Insufficient documentation

## 2021-04-14 DIAGNOSIS — I5042 Chronic combined systolic (congestive) and diastolic (congestive) heart failure: Secondary | ICD-10-CM | POA: Insufficient documentation

## 2021-04-14 DIAGNOSIS — R531 Weakness: Secondary | ICD-10-CM | POA: Insufficient documentation

## 2021-04-14 DIAGNOSIS — D631 Anemia in chronic kidney disease: Secondary | ICD-10-CM | POA: Insufficient documentation

## 2021-04-14 DIAGNOSIS — R059 Cough, unspecified: Secondary | ICD-10-CM | POA: Insufficient documentation

## 2021-04-14 DIAGNOSIS — Z87891 Personal history of nicotine dependence: Secondary | ICD-10-CM | POA: Diagnosis not present

## 2021-04-14 DIAGNOSIS — I130 Hypertensive heart and chronic kidney disease with heart failure and stage 1 through stage 4 chronic kidney disease, or unspecified chronic kidney disease: Secondary | ICD-10-CM | POA: Insufficient documentation

## 2021-04-14 DIAGNOSIS — Z7901 Long term (current) use of anticoagulants: Secondary | ICD-10-CM | POA: Insufficient documentation

## 2021-04-14 DIAGNOSIS — I480 Paroxysmal atrial fibrillation: Secondary | ICD-10-CM | POA: Insufficient documentation

## 2021-04-14 DIAGNOSIS — R000 Tachycardia, unspecified: Secondary | ICD-10-CM | POA: Diagnosis not present

## 2021-04-14 LAB — URINALYSIS, ROUTINE W REFLEX MICROSCOPIC
Bilirubin Urine: NEGATIVE
Glucose, UA: NEGATIVE mg/dL
Hgb urine dipstick: NEGATIVE
Ketones, ur: NEGATIVE mg/dL
Leukocytes,Ua: NEGATIVE
Nitrite: NEGATIVE
Protein, ur: NEGATIVE mg/dL
Specific Gravity, Urine: 1.013 (ref 1.005–1.030)
pH: 6 (ref 5.0–8.0)

## 2021-04-14 LAB — COMPREHENSIVE METABOLIC PANEL
ALT: 18 U/L (ref 0–44)
AST: 20 U/L (ref 15–41)
Albumin: 2.8 g/dL — ABNORMAL LOW (ref 3.5–5.0)
Alkaline Phosphatase: 62 U/L (ref 38–126)
Anion gap: 9 (ref 5–15)
BUN: 30 mg/dL — ABNORMAL HIGH (ref 8–23)
CO2: 26 mmol/L (ref 22–32)
Calcium: 9.6 mg/dL (ref 8.9–10.3)
Chloride: 96 mmol/L — ABNORMAL LOW (ref 98–111)
Creatinine, Ser: 2.32 mg/dL — ABNORMAL HIGH (ref 0.44–1.00)
GFR, Estimated: 20 mL/min — ABNORMAL LOW (ref >60)
Glucose, Bld: 205 mg/dL — ABNORMAL HIGH (ref 70–99)
Potassium: 3.7 mmol/L (ref 3.5–5.1)
Sodium: 131 mmol/L — ABNORMAL LOW (ref 135–145)
Total Bilirubin: 0.5 mg/dL (ref 0.3–1.2)
Total Protein: 6.4 g/dL — ABNORMAL LOW (ref 6.5–8.1)

## 2021-04-14 LAB — TROPONIN I (HIGH SENSITIVITY)
Troponin I (High Sensitivity): 25 ng/L — ABNORMAL HIGH (ref <18)
Troponin I (High Sensitivity): 24 ng/L — ABNORMAL HIGH (ref <18)

## 2021-04-14 LAB — CBC WITH DIFFERENTIAL/PLATELET
Abs Immature Granulocytes: 0.05 10*3/uL (ref 0.00–0.07)
Basophils Absolute: 0 10*3/uL (ref 0.0–0.1)
Basophils Relative: 0 %
Eosinophils Absolute: 0 10*3/uL (ref 0.0–0.5)
Eosinophils Relative: 1 %
HCT: 25.9 % — ABNORMAL LOW (ref 36.0–46.0)
Hemoglobin: 8.2 g/dL — ABNORMAL LOW (ref 12.0–15.0)
Immature Granulocytes: 1 %
Lymphocytes Relative: 19 %
Lymphs Abs: 1.3 10*3/uL (ref 0.7–4.0)
MCH: 28.9 pg (ref 26.0–34.0)
MCHC: 31.7 g/dL (ref 30.0–36.0)
MCV: 91.2 fL (ref 80.0–100.0)
Monocytes Absolute: 0.6 10*3/uL (ref 0.1–1.0)
Monocytes Relative: 8 %
Neutro Abs: 5 10*3/uL (ref 1.7–7.7)
Neutrophils Relative %: 71 %
Platelets: 316 10*3/uL (ref 150–400)
RBC: 2.84 MIL/uL — ABNORMAL LOW (ref 3.87–5.11)
RDW: 17.3 % — ABNORMAL HIGH (ref 11.5–15.5)
WBC: 7.1 10*3/uL (ref 4.0–10.5)
nRBC: 0 % (ref 0.0–0.2)

## 2021-04-14 LAB — BRAIN NATRIURETIC PEPTIDE: B Natriuretic Peptide: 524.4 pg/mL — ABNORMAL HIGH (ref 0.0–100.0)

## 2021-04-14 LAB — LIPASE, BLOOD: Lipase: 33 U/L (ref 11–51)

## 2021-04-14 MED ORDER — SODIUM CHLORIDE 0.9 % IV BOLUS
250.0000 mL | Freq: Once | INTRAVENOUS | Status: AC
  Administered 2021-04-14: 250 mL via INTRAVENOUS

## 2021-04-14 NOTE — Discharge Instructions (Addendum)
Overall electrolytes were normal.  No evidence to suggest ongoing lung infection.  No urine infection.  Continue physical therapy.

## 2021-04-14 NOTE — ED Provider Notes (Signed)
[facility] EMERGENCY DEPARTMENT Provider Note   CSN: [id number] Arrival date & time: 04/14/21  1856     History Chief Complaint  Patient presents with   Weakness    Krista Mason is a 85 y.o. female.  Patient here for evaluation as she has not been doing well at her nursing home.  Was diagnosed with COVID last month.  Went to rehab facility afterwards and was improving and then developed the lung infection and currently on antibiotics with levofloxacin and doxycycline.  She has been having generalized weakness and muscle spasms and shaking of her legs and seems to be getting worse from physical deconditioning.  She is continues with cough and sputum production.  She has not had fever.  The history is provided by the patient.  Weakness Severity:  Moderate Onset quality:  Gradual Duration:  4 days Timing:  Constant Progression:  Unchanged Chronicity:  New Relieved by:  Nothing Worsened by:  Nothing Associated symptoms: cough   Associated symptoms: no abdominal pain, no arthralgias, no chest pain, no dysuria, no fever, no seizures, no shortness of breath and no vomiting       Past Medical History:  Diagnosis Date   Anemia    Atrial fibrillation (HCC)    Chronic combined systolic and diastolic CHF (congestive heart failure) ([place])    a. LV dysfcuntion felt to be related to PVCs   Chronic kidney disease    Diabetes mellitus without complication (HCC)    GERD (gastroesophageal reflux disease)    Hx of echocardiogram    Echo (1/16):  EF 60-65%, no RWMA, Gr 1 DD, mod AI, mod MR, mild LAE   Hyperkalemia    Hyperlipidemia    Hypertension    NICM (nonischemic cardiomyopathy) ([place])    a. 12/2018 Echo: EF 35%, diff HK w/ septal-lateral dyssynchrony. Nl RV fxn; b. 07/2019 s/p MDT [id number] [name] CRT-P MRI SureScan device (ser #: [id number])   Orthostatic hypotension    PVC (premature ventricular contraction)    a. s/p PVC ablation in 11/2016   Rheumatic  fever    Wears dentures     Patient Active Problem List   Diagnosis Date Noted   Paroxysmal atrial fibrillation ([place]) 09/10/2019   Secondary hypercoagulable state ([place]) 09/10/2019   Nonischemic cardiomyopathy ([place]) 07/24/2019   Acute hypoxemic respiratory failure ([place])    COPD  GOLD 0  33/00/7622   Chronic systolic CHF (congestive heart failure) ([place]) 11/13/2016   PVC (premature ventricular contraction) 11/13/2016   Mixed hyperlipidemia 11/05/2016   CKD (chronic kidney disease), stage III ([place]) 11/05/2016   Tobacco abuse 11/05/2016   Multifocal PVCs 11/05/2016   Moderate mitral regurgitation 11/05/2016   Microcytic anemia 11/05/2016   Diabetes mellitus with complication ([place])    Uncontrolled hypertension 06/18/2016   Multifocal atrial tachycardia ([place]) 11/26/2014   PVD (peripheral vascular disease) ([place]) 12/15/2012    Past Surgical History:  Procedure Laterality Date   BALLOON DILATION N/A 07/12/2020   Procedure: BALLOON DILATION;  Surgeon: Ronald Lobo, MD;  Location: [facility];  Service: Endoscopy;  Laterality: N/A;   BIOPSY  08/20/2019   Procedure: BIOPSY;  Surgeon: Otis Brace, MD;  Location: [facility];  Service: Gastroenterology;;   BIV PACEMAKER INSERTION CRT-P N/A 07/24/2019   Procedure: BIV PACEMAKER INSERTION CRT-P;  Surgeon: Constance Haw, MD;  Location: [facility] LAB;  Service: Cardiovascular;  Laterality: N/A;   COLONOSCOPY WITH PROPOFOL N/A 08/22/2019   Procedure: COLONOSCOPY WITH PROPOFOL;  Surgeon: Oletta Lamas,  Jeneen Rinks, MD;  Location: [facility];  Service: Endoscopy;  Laterality: N/A;   ESOPHAGOGASTRODUODENOSCOPY N/A 02/28/2021   Procedure: ESOPHAGOGASTRODUODENOSCOPY (EGD);  Surgeon: Wilford Corner, MD;  Location: [facility];  Service: Endoscopy;  Laterality: N/A;   ESOPHAGOGASTRODUODENOSCOPY (EGD) WITH PROPOFOL N/A 09/25/2018   Procedure: ESOPHAGOGASTRODUODENOSCOPY (EGD) WITH PROPOFOL;  Surgeon: Ronald Lobo, MD;  [facility];  Service: Endoscopy;  Laterality: N/A;   ESOPHAGOGASTRODUODENOSCOPY (EGD) WITH PROPOFOL N/A 10/29/2018   Procedure: ESOPHAGOGASTRODUODENOSCOPY (EGD) WITH PROPOFOL;  Surgeon: Ronald Lobo, MD;  [facility];  Service: Endoscopy;  Laterality: N/A;   ESOPHAGOGASTRODUODENOSCOPY (EGD) WITH PROPOFOL N/A 08/20/2019   Procedure: ESOPHAGOGASTRODUODENOSCOPY (EGD) WITH PROPOFOL;  Surgeon: Otis Brace, MD;  Location: [facility];  Service: Gastroenterology;  Laterality: N/A;   ESOPHAGOGASTRODUODENOSCOPY (EGD) WITH PROPOFOL N/A 05/03/2020   Procedure: ESOPHAGOGASTRODUODENOSCOPY (EGD) WITH PROPOFOL;  Surgeon: Ronald Lobo, MD;  [facility];  Service: Endoscopy;  Laterality: N/A;   ESOPHAGOGASTRODUODENOSCOPY (EGD) WITH PROPOFOL N/A 06/02/2020   Procedure: ESOPHAGOGASTRODUODENOSCOPY (EGD) WITH PROPOFOL and Savory Dilatation;  Surgeon: Clarene Essex, MD;  [facility];  Service: Gastroenterology;  Laterality: N/A;   ESOPHAGOGASTRODUODENOSCOPY (EGD) WITH PROPOFOL N/A 07/12/2020   Procedure: ESOPHAGOGASTRODUODENOSCOPY (EGD) WITH PROPOFOL with dilatation;  Surgeon: Ronald Lobo, MD;  [facility];  Service: Endoscopy;  Laterality: N/A;   HEMOSTASIS CLIP PLACEMENT  08/22/2019   Procedure: HEMOSTASIS CLIP PLACEMENT;  Surgeon: Laurence Spates, MD;  Location[facility] END[facility]COPY;  Service: Endoscopy;;   KNEE SURGERY Left 2009   OVARIAN CYST REMOVAL     POLYPECTOMY  08/22/2019   Procedure: POLYPECTOMY;  Surgeon: Laurence Spates, MD;  Location: [facility];  Service: Endoscopy;;   PVC ABLATION N/A 11/30/2016   Procedure: PVC Ablation;  Surgeon: Will [name], MD;  Location: [facility] CV LAB;  Service: Cardiovascular;  Laterality: N/A;   RIGHT HEART CATH N/A 01/31/2018   Procedure: RIGHT HEART CATH;  Surgeon: Jolaine Artist, MD;  Location: [facility] CV LAB;  Service: Cardiovascular;  Laterality: N/A;   RIGHT/LEFT HEART CATH AND CORONARY ANGIOGRAPHY N/A  01/25/2017   Procedure: Right/Left Heart Cath and Coronary Angiography;  Surgeon: Jolaine Artist, MD;  Location: [name] CV LAB;  Service: Cardiovascular;  Laterality: N/A;   SAVORY DILATION N/A 09/25/2018   Procedure: SAVORY DILATION;  Surgeon: Ronald Lobo, MD;  [facility];  Service: Endoscopy;  Laterality: N/A;   SAVORY DILATION N/A 10/29/2018   Procedure: SAVORY DILATION;  Surgeon: Ronald Lobo, MD;  [facility];  Service: Endoscopy;  Laterality: N/A;   SAVORY DILATION N/A 08/20/2019   Procedure: SAVORY DILATION;  Surgeon: Otis Brace, MD;  [facility];  Service: Gastroenterology;  Laterality: N/A;   SAVORY DILATION N/A 05/03/2020   Procedure: SAVORY DILATION;  Surgeon: Ronald Lobo, MD;  [facility];  Service: Endoscopy;  Laterality: N/A;  Patient needs ultraslim pediatric upper endoscope/     no fluoro per dr. Cristal Deer DILATION N/A 06/02/2020   Procedure: SAVORY DILATION w/ Burtis Junes;  Surgeon: Clarene Essex, MD;  [facility];  Service: Gastroenterology;  Laterality: N/A;     OB History   No obstetric history on file.     Family History  Problem Relation Age of Onset   Heart attack Father    Heart disease Father    Cancer Father    Hypertension Father    Hypertension Brother    Stroke Brother    Hypertension Brother    Diabetes Son    Hyperlipidemia Son  Hypertension Son     Social History   Tobacco Use   Smoking status: Former    Packs/day: 0.75    Years: 60.00    Pack years: 45.00    Types: Cigarettes    Quit date: 11/04/2016    Years since quitting: 4.4   Smokeless tobacco: Never  Vaping Use   Vaping Use: Never used  Substance Use Topics   Alcohol use: No   Drug use: No    Home Medications Prior to Admission medications   Medication Sig Start Date End Date Taking? Authorizing Provider  acetaminophen (TYLENOL) 500 MG tablet Take 1,000 mg by mouth every 6 (six) hours as needed  for moderate pain, headache or mild pain.   Yes [provider]  amiodarone (PACERONE) 100 MG tablet TAKE ONE TABLET BY MOUTH EVERY OTHER DAY Patient taking differently: Take 100 mg by mouth every other day. 04/10/21  Yes Camnitz, Will Hassell Done, MD  atorvastatin (LIPITOR) 20 MG tablet Take 1 tablet (20 mg total) by mouth at bedtime. 03/08/21  Yes Bensimhon, Shaune Pascal, MD  carvedilol (COREG) 12.5 MG tablet Take 1 tablet (12.5 mg total) by mouth 2 (two) times daily with a meal. 05/04/20  Yes Bensimhon, Shaune Pascal, MD  cholecalciferol (VITAMIN D3) 25 MCG (1000 UNIT) tablet Take 2,000 Units by mouth every morning.   Yes [provider]  diphenhydrAMINE (BENADRYL) 25 mg capsule Take 25 mg by mouth every 6 (six) hours as needed for allergies.    Yes [provider]  doxycycline (VIBRAMYCIN) 100 MG capsule Take 100 mg by mouth 2 (two) times daily.   Yes [provider]  ELIQUIS 2.5 MG TABS tablet TAKE 1 TABLET BY MOUTH TWICE A DAY Patient taking differently: Take 2.5 mg by mouth 2 (two) times daily with a meal. 06/14/20  Yes Fenton, Clint R, PA  feeding supplement, ENSURE ENLIVE, (ENSURE ENLIVE) LIQD Take 237 mLs by mouth 2 (two) times daily between meals. Patient taking differently: Take 237 mLs by mouth 3 (three) times daily with meals. 08/25/19  Yes Mikhail, Maryann, DO  guaifenesin (HUMIBID E) 400 MG TABS tablet Take 400 mg by mouth 2 (two) times daily.   Yes [provider]  hydrALAZINE (APRESOLINE) 50 MG tablet Take 50 mg by mouth 2 (two) times daily with a meal.   Yes [provider]  imiquimod (ALDARA) 5 % cream Apply 1 application topically as needed (as directed, for skin cancer sites). 07/06/20  Yes [provider]  levofloxacin (LEVAQUIN) 750 MG tablet Take 750 mg by mouth daily at 12 noon.   Yes [provider]  omeprazole (PRILOSEC) 20 MG capsule Take 20 mg by mouth 2 (two) times daily. 6am, 8pm   Yes [provider]   ondansetron (ZOFRAN) 4 MG tablet Take 4 mg by mouth every 6 (six) hours as needed for nausea or vomiting.   Yes [provider]  pantoprazole (PROTONIX) 40 MG tablet Take 1 tablet (40 mg total) by mouth 2 (two) times daily before a meal. Patient taking differently: Take 40 mg by mouth 2 (two) times daily with a meal. 03/14/21 04/14/21 Yes Cherene Altes, MD  Polyethyl Glycol-Propyl Glycol 0.4-0.3 % SOLN Place 1 drop into both eyes every 8 (eight) hours as needed (dryness).   Yes [provider]  polyethylene glycol (MIRALAX / GLYCOLAX) 17 g packet Take 17 g by mouth daily.   Yes [provider]  Potassium Bicarb-Citric Acid (EFFER-K) 20 MEQ TBEF Take 40  mEq by mouth daily.   Yes [provider]  sucralfate (CARAFATE) 1 GM/10ML suspension Take 10 mLs (1 g total) by mouth 4 (four) times daily -  with meals and at bedtime. 03/14/21 04/14/21 Yes Cherene Altes, MD  torsemide (DEMADEX) 20 MG tablet Take 40 mg by mouth in the morning. 11/17/20  Yes [provider]  umeclidinium-vilanterol (ANORO ELLIPTA) 62.5-25 MCG/INH AEPB Inhale 1 puff into the lungs daily.   Yes [provider]  vitamin B-12 (CYANOCOBALAMIN) 1000 MCG tablet Take 1 tablet (1,000 mcg total) by mouth daily. 08/25/19  Yes Mikhail, Velta Addison, DO  potassium chloride (KLOR-CON) 20 MEQ packet Take 20 mEq by mouth daily. Patient not taking: No sig reported 03/14/21   Cherene Altes, MD    Allergies    Epoetin alfa, Penicillins, Retacrit [epoetin (alfa)], Crestor [rosuvastatin], Hydrocodone, Influenza virus vaccine split, Tape, Cefaclor, Crestor [rosuvastatin calcium], Influenza vaccines, Lidocaine hcl, Nsaids, Percocet [oxycodone-acetaminophen], and Prednisone  Review of Systems   Review of Systems  Constitutional:  Negative for chills and fever.  HENT:  Negative for ear pain and sore throat.   Eyes:  Negative for pain and visual disturbance.  Respiratory:  Positive for cough.  Negative for shortness of breath.   Cardiovascular:  Negative for chest pain and palpitations.  Gastrointestinal:  Negative for abdominal pain and vomiting.  Genitourinary:  Negative for dysuria and hematuria.  Musculoskeletal:  Negative for arthralgias and back pain.  Skin:  Negative for color change and rash.  Neurological:  Positive for tremors (spasms) and weakness. Negative for seizures and syncope.  All other systems reviewed and are negative.  Physical Exam Updated Vital Signs BP (!) 115/43   Pulse 91   Temp 98.5 F (36.9 C) (Oral)   Resp (!) 21   SpO2 96%   Physical Exam Vitals and nursing note reviewed.  Constitutional:      General: She is not in acute distress.    Appearance: She is well-developed. She is not ill-appearing.  HENT:     Head: Normocephalic and atraumatic.     Nose: Nose normal.     Mouth/Throat:     Mouth: Mucous membranes are moist.  Eyes:     Extraocular Movements: Extraocular movements intact.     Conjunctiva/sclera: Conjunctivae normal.     Pupils: Pupils are equal, round, and reactive to light.  Cardiovascular:     Rate and Rhythm: Normal rate and regular rhythm.     Pulses: Normal pulses.     Heart sounds: Normal heart sounds. No murmur heard. Pulmonary:     Effort: Pulmonary effort is normal. No respiratory distress.     Breath sounds: Normal breath sounds.  Abdominal:     Palpations: Abdomen is soft.     Tenderness: There is no abdominal tenderness.  Musculoskeletal:        General: No tenderness. Normal range of motion.     Cervical back: Normal range of motion and neck supple.  Skin:    General: Skin is warm and dry.     Capillary Refill: Capillary refill takes less than 2 seconds.  Neurological:     General: No focal deficit present.     Mental Status: She is alert and oriented to person, place, and time.     Cranial Nerves: No cranial nerve deficit.     Sensory: No sensory deficit.     Motor: No weakness.     Coordination:  Coordination normal.     Comments: 5+  out of 5 strength throughout, normal sensation, no drift, normal finger-to-nose finger, normal speech    ED Results / Procedures / Treatments   Labs (all labs ordered are listed, but only abnormal results are displayed) Labs Reviewed  CBC WITH DIFFERENTIAL/PLATELET - Abnormal; Notable for the following components:      Result Value   RBC 2.84 (*)    Hemoglobin 8.2 (*)    HCT 25.9 (*)    RDW 17.3 (*)    All other components within normal limits  COMPREHENSIVE METABOLIC PANEL - Abnormal; Notable for the following components:   Sodium 131 (*)    Chloride 96 (*)    Glucose, Bld 205 (*)    BUN 30 (*)    Creatinine, Ser 2.32 (*)    Total Protein 6.4 (*)    Albumin 2.8 (*)    GFR, Estimated 20 (*)    All other components within normal limits  BRAIN NATRIURETIC PEPTIDE - Abnormal; Notable for the following components:   B Natriuretic Peptide 524.4 (*)    All other components within normal limits  TROPONIN I (HIGH SENSITIVITY) - Abnormal; Notable for the following components:   Troponin I (High Sensitivity) 25 (*)    All other components within normal limits  TROPONIN I (HIGH SENSITIVITY) - Abnormal; Notable for the following components:   Troponin I (High Sensitivity) 24 (*)    All other components within normal limits  LIPASE, BLOOD  URINALYSIS, ROUTINE W REFLEX MICROSCOPIC    EKG EKG Interpretation  Date/Time:  Friday April 14 2021 19:27:20 EDT Ventricular Rate:  109 PR Interval:  118 QRS Duration: 153 QT Interval:  419 QTC Calculation: 565 R Axis:   270 Text Interpretation: Sinus tachycardia with irregular rate Right bundle branch block Confirmed by Lennice Sites ([id number]) on 04/14/2021 7:34:00 PM  Radiology DG Chest Portable 1 View  Result Date: 04/14/2021 CLINICAL DATA:  Cough. EXAM: PORTABLE CHEST 1 VIEW COMPARISON:  Feb 27, 2021. FINDINGS: Three lead pacer device in place. EKG leads project over the chest and coiled over the chest.  Cardiomediastinal contours with signs of prior granulomatous disease and aortic atherosclerosis as before. No signs of lobar consolidative change.  No effusion. On limited assessment no acute skeletal process. IMPRESSION: 1. No acute cardiopulmonary disease. 2. Signs of prior granulomatous disease and aortic atherosclerosis. Aortic Atherosclerosis (ICD10-I70.0). Electronically Signed   By: Zetta Bills M.D.   On: 04/14/2021 20:32    Procedures Procedures   Medications Ordered in ED Medications  sodium chloride 0.9 % bolus 250 mL (250 mLs Intravenous New Bag/Given 04/14/21 2208)    ED Course  I have reviewed the triage vital signs and the nursing notes.  Pertinent labs & imaging results that were available during my care of the patient were reviewed by me and considered in my medical decision making (see chart for details).    MDM Rules/Calculators/A&P                          Kadian C Froelich is here for generalized weakness.  Patient with normal vitals.  No fever.  Currently at skilled nursing facility after having COVID.  Has had [facility] stay there has been working with physical therapy.  Mostly using a wheelchair and walker at times.  She has been having some tremors and muscle spasms recently waking her up at night.  She has not progressed as well the last several days that she was earlier at the  stay.  Neurologically she is intact.  No concern for stroke.  She is currently on antibiotics for presumed lung infection.  She denies any abdominal pain, shortness of breath.  Overall will evaluate for infectious process.  She is not having any chest pain but will check troponin.  EKG shows rate controlled atrial fibrillation.  No ischemic changes.  First troponin is 25, will trend second.  BNP around baseline at 500.  No evidence of volume overload on exam.  We will actually give her a small fluid bolus as she appears slightly dry.  Orthostatics are normal.  No significant anemia, electrolyte  abnormality.  Creatinine is overall baseline.  No significant anemia.  Chest x-ray with no evidence of infection.  Awaiting urinalysis and repeat troponin but anticipate discharge back to nursing home for continued care.  Urinalysis negative for infection.  Troponin stable at 24.  No concern for ACS.  Discharged in good condition. This chart was dictated using voice recognition software.  Despite best efforts to proofread,  errors can occur which can change the documentation meaning.   Final Clinical Impression(s) / ED Diagnoses Final diagnoses:  Weakness    Rx / DC Orders ED Discharge Orders     None        Lennice Sites, DO 04/14/21 2239

## 2021-04-14 NOTE — ED Notes (Signed)
PTAR CALLED PT IS NUMBER 10 ON THE LIST

## 2021-04-14 NOTE — ED Triage Notes (Signed)
Pt BIB [facility] from [facility] for weakness in the legs starting Tuesday and getting progressively worse since then. Pt usually able to ambulate but unable to currently. Pt c/o tremors, shakiness, and muscle spasms waking her up at night and low back pain. Pt reports she has not been getting her potassium as ordered. Pt reports providers have been treating her for fluid on the lungs with additional torsemide, levaquin, and doxycycline.

## 2021-04-14 NOTE — ED Notes (Signed)
During orthostatic vitals, the patient stated that she felt dizziness upon sitting up in the bed. This EMT asked the patient if she felt like she could stand. The patient said that she did not feel like she was able.

## 2021-04-17 DIAGNOSIS — I509 Heart failure, unspecified: Secondary | ICD-10-CM | POA: Diagnosis not present

## 2021-04-17 DIAGNOSIS — K5900 Constipation, unspecified: Secondary | ICD-10-CM | POA: Diagnosis not present

## 2021-04-17 DIAGNOSIS — N182 Chronic kidney disease, stage 2 (mild): Secondary | ICD-10-CM | POA: Diagnosis not present

## 2021-04-17 DIAGNOSIS — R5381 Other malaise: Secondary | ICD-10-CM | POA: Diagnosis not present

## 2021-04-17 DIAGNOSIS — I10 Essential (primary) hypertension: Secondary | ICD-10-CM | POA: Diagnosis not present

## 2021-04-17 DIAGNOSIS — K922 Gastrointestinal hemorrhage, unspecified: Secondary | ICD-10-CM | POA: Diagnosis not present

## 2021-04-17 DIAGNOSIS — K219 Gastro-esophageal reflux disease without esophagitis: Secondary | ICD-10-CM | POA: Diagnosis not present

## 2021-04-17 DIAGNOSIS — E785 Hyperlipidemia, unspecified: Secondary | ICD-10-CM | POA: Diagnosis not present

## 2021-04-18 ENCOUNTER — Other Ambulatory Visit (HOSPITAL_COMMUNITY): Payer: Self-pay | Admitting: *Deleted

## 2021-04-18 DIAGNOSIS — M1990 Unspecified osteoarthritis, unspecified site: Secondary | ICD-10-CM | POA: Diagnosis not present

## 2021-04-18 DIAGNOSIS — E1122 Type 2 diabetes mellitus with diabetic chronic kidney disease: Secondary | ICD-10-CM | POA: Diagnosis not present

## 2021-04-18 DIAGNOSIS — I130 Hypertensive heart and chronic kidney disease with heart failure and stage 1 through stage 4 chronic kidney disease, or unspecified chronic kidney disease: Secondary | ICD-10-CM | POA: Diagnosis not present

## 2021-04-18 DIAGNOSIS — N1832 Chronic kidney disease, stage 3b: Secondary | ICD-10-CM | POA: Diagnosis not present

## 2021-04-18 DIAGNOSIS — J449 Chronic obstructive pulmonary disease, unspecified: Secondary | ICD-10-CM | POA: Diagnosis not present

## 2021-04-18 DIAGNOSIS — I5022 Chronic systolic (congestive) heart failure: Secondary | ICD-10-CM | POA: Diagnosis not present

## 2021-04-18 DIAGNOSIS — K219 Gastro-esophageal reflux disease without esophagitis: Secondary | ICD-10-CM | POA: Diagnosis not present

## 2021-04-18 DIAGNOSIS — I480 Paroxysmal atrial fibrillation: Secondary | ICD-10-CM | POA: Diagnosis not present

## 2021-04-18 DIAGNOSIS — E785 Hyperlipidemia, unspecified: Secondary | ICD-10-CM | POA: Diagnosis not present

## 2021-04-18 DIAGNOSIS — E1151 Type 2 diabetes mellitus with diabetic peripheral angiopathy without gangrene: Secondary | ICD-10-CM | POA: Diagnosis not present

## 2021-04-18 DIAGNOSIS — D631 Anemia in chronic kidney disease: Secondary | ICD-10-CM | POA: Diagnosis not present

## 2021-04-18 DIAGNOSIS — M25512 Pain in left shoulder: Secondary | ICD-10-CM | POA: Diagnosis not present

## 2021-04-19 ENCOUNTER — Ambulatory Visit (HOSPITAL_COMMUNITY)
Admission: RE | Admit: 2021-04-19 | Discharge: 2021-04-19 | Disposition: A | Discharge status: Home / Self Care | Payer: Medicare HMO | Source: Ambulatory Visit | Attending: Nephrology | Admitting: Nephrology

## 2021-04-19 ENCOUNTER — Other Ambulatory Visit: Payer: Self-pay

## 2021-04-19 VITALS — BP 116/55 | HR 71 | Temp 97.1°F | Resp 20

## 2021-04-19 DIAGNOSIS — N183 Chronic kidney disease, stage 3 unspecified: Secondary | ICD-10-CM | POA: Insufficient documentation

## 2021-04-19 LAB — POCT HEMOGLOBIN-HEMACUE: Hemoglobin: 7.4 g/dL — ABNORMAL LOW (ref 12.0–15.0)

## 2021-04-19 MED ORDER — SODIUM CHLORIDE 0.9 % IV SOLN
510.0000 mg | INTRAVENOUS | Status: DC
  Administered 2021-04-19: 510 mg via INTRAVENOUS
  Filled 2021-04-19: qty 17

## 2021-04-19 MED ORDER — DARBEPOETIN ALFA 100 MCG/0.5ML IJ SOSY
PREFILLED_SYRINGE | INTRAMUSCULAR | Status: AC
  Filled 2021-04-19: qty 0.5

## 2021-04-19 MED ORDER — DARBEPOETIN ALFA 100 MCG/0.5ML IJ SOSY
100.0000 ug | PREFILLED_SYRINGE | INTRAMUSCULAR | Status: DC
  Administered 2021-04-19: 100 ug via SUBCUTANEOUS

## 2021-04-19 NOTE — Progress Notes (Signed)
Hemocue 7.4. Called Dr. Bishop [place] office and spoke with [employer]. Dr. Hollie Salk aware that pt recently hospitalized with Covid and GI bleed. Here today for Aranesp subcu and IV Feraheme, first of 2 ordered doses. Informed Amber that pt reporting being "really weak" but no acute distress noted. No new orders received.

## 2021-04-20 DIAGNOSIS — K219 Gastro-esophageal reflux disease without esophagitis: Secondary | ICD-10-CM | POA: Diagnosis not present

## 2021-04-20 DIAGNOSIS — I130 Hypertensive heart and chronic kidney disease with heart failure and stage 1 through stage 4 chronic kidney disease, or unspecified chronic kidney disease: Secondary | ICD-10-CM | POA: Diagnosis not present

## 2021-04-20 DIAGNOSIS — E785 Hyperlipidemia, unspecified: Secondary | ICD-10-CM | POA: Diagnosis not present

## 2021-04-20 DIAGNOSIS — J449 Chronic obstructive pulmonary disease, unspecified: Secondary | ICD-10-CM | POA: Diagnosis not present

## 2021-04-20 DIAGNOSIS — D631 Anemia in chronic kidney disease: Secondary | ICD-10-CM | POA: Diagnosis not present

## 2021-04-20 DIAGNOSIS — I5022 Chronic systolic (congestive) heart failure: Secondary | ICD-10-CM | POA: Diagnosis not present

## 2021-04-20 DIAGNOSIS — E1122 Type 2 diabetes mellitus with diabetic chronic kidney disease: Secondary | ICD-10-CM | POA: Diagnosis not present

## 2021-04-20 DIAGNOSIS — I480 Paroxysmal atrial fibrillation: Secondary | ICD-10-CM | POA: Diagnosis not present

## 2021-04-20 DIAGNOSIS — N1832 Chronic kidney disease, stage 3b: Secondary | ICD-10-CM | POA: Diagnosis not present

## 2021-04-20 DIAGNOSIS — E1151 Type 2 diabetes mellitus with diabetic peripheral angiopathy without gangrene: Secondary | ICD-10-CM | POA: Diagnosis not present

## 2021-04-25 DIAGNOSIS — D649 Anemia, unspecified: Secondary | ICD-10-CM | POA: Diagnosis not present

## 2021-04-25 DIAGNOSIS — N184 Chronic kidney disease, stage 4 (severe): Secondary | ICD-10-CM | POA: Diagnosis not present

## 2021-04-25 DIAGNOSIS — D500 Iron deficiency anemia secondary to blood loss (chronic): Secondary | ICD-10-CM | POA: Diagnosis not present

## 2021-04-25 DIAGNOSIS — E1122 Type 2 diabetes mellitus with diabetic chronic kidney disease: Secondary | ICD-10-CM | POA: Diagnosis not present

## 2021-04-25 DIAGNOSIS — E538 Deficiency of other specified B group vitamins: Secondary | ICD-10-CM | POA: Diagnosis not present

## 2021-04-25 DIAGNOSIS — R5383 Other fatigue: Secondary | ICD-10-CM | POA: Diagnosis not present

## 2021-04-26 DIAGNOSIS — I480 Paroxysmal atrial fibrillation: Secondary | ICD-10-CM | POA: Diagnosis not present

## 2021-04-26 DIAGNOSIS — E1122 Type 2 diabetes mellitus with diabetic chronic kidney disease: Secondary | ICD-10-CM | POA: Diagnosis not present

## 2021-04-26 DIAGNOSIS — K219 Gastro-esophageal reflux disease without esophagitis: Secondary | ICD-10-CM | POA: Diagnosis not present

## 2021-04-26 DIAGNOSIS — E1151 Type 2 diabetes mellitus with diabetic peripheral angiopathy without gangrene: Secondary | ICD-10-CM | POA: Diagnosis not present

## 2021-04-26 DIAGNOSIS — D631 Anemia in chronic kidney disease: Secondary | ICD-10-CM | POA: Diagnosis not present

## 2021-04-26 DIAGNOSIS — I130 Hypertensive heart and chronic kidney disease with heart failure and stage 1 through stage 4 chronic kidney disease, or unspecified chronic kidney disease: Secondary | ICD-10-CM | POA: Diagnosis not present

## 2021-04-26 DIAGNOSIS — E785 Hyperlipidemia, unspecified: Secondary | ICD-10-CM | POA: Diagnosis not present

## 2021-04-26 DIAGNOSIS — J449 Chronic obstructive pulmonary disease, unspecified: Secondary | ICD-10-CM | POA: Diagnosis not present

## 2021-04-26 DIAGNOSIS — N1832 Chronic kidney disease, stage 3b: Secondary | ICD-10-CM | POA: Diagnosis not present

## 2021-04-26 DIAGNOSIS — I5022 Chronic systolic (congestive) heart failure: Secondary | ICD-10-CM | POA: Diagnosis not present

## 2021-04-27 DIAGNOSIS — I5022 Chronic systolic (congestive) heart failure: Secondary | ICD-10-CM | POA: Diagnosis not present

## 2021-04-27 DIAGNOSIS — E1151 Type 2 diabetes mellitus with diabetic peripheral angiopathy without gangrene: Secondary | ICD-10-CM | POA: Diagnosis not present

## 2021-04-27 DIAGNOSIS — J449 Chronic obstructive pulmonary disease, unspecified: Secondary | ICD-10-CM | POA: Diagnosis not present

## 2021-04-27 DIAGNOSIS — E1122 Type 2 diabetes mellitus with diabetic chronic kidney disease: Secondary | ICD-10-CM | POA: Diagnosis not present

## 2021-04-27 DIAGNOSIS — N1832 Chronic kidney disease, stage 3b: Secondary | ICD-10-CM | POA: Diagnosis not present

## 2021-04-27 DIAGNOSIS — I130 Hypertensive heart and chronic kidney disease with heart failure and stage 1 through stage 4 chronic kidney disease, or unspecified chronic kidney disease: Secondary | ICD-10-CM | POA: Diagnosis not present

## 2021-04-27 DIAGNOSIS — D631 Anemia in chronic kidney disease: Secondary | ICD-10-CM | POA: Diagnosis not present

## 2021-04-27 DIAGNOSIS — I480 Paroxysmal atrial fibrillation: Secondary | ICD-10-CM | POA: Diagnosis not present

## 2021-04-27 DIAGNOSIS — E785 Hyperlipidemia, unspecified: Secondary | ICD-10-CM | POA: Diagnosis not present

## 2021-04-27 DIAGNOSIS — K219 Gastro-esophageal reflux disease without esophagitis: Secondary | ICD-10-CM | POA: Diagnosis not present

## 2021-04-28 DIAGNOSIS — D631 Anemia in chronic kidney disease: Secondary | ICD-10-CM | POA: Diagnosis not present

## 2021-04-28 DIAGNOSIS — I130 Hypertensive heart and chronic kidney disease with heart failure and stage 1 through stage 4 chronic kidney disease, or unspecified chronic kidney disease: Secondary | ICD-10-CM | POA: Diagnosis not present

## 2021-04-28 DIAGNOSIS — I480 Paroxysmal atrial fibrillation: Secondary | ICD-10-CM | POA: Diagnosis not present

## 2021-04-28 DIAGNOSIS — N1832 Chronic kidney disease, stage 3b: Secondary | ICD-10-CM | POA: Diagnosis not present

## 2021-04-28 DIAGNOSIS — E1122 Type 2 diabetes mellitus with diabetic chronic kidney disease: Secondary | ICD-10-CM | POA: Diagnosis not present

## 2021-04-28 DIAGNOSIS — I5022 Chronic systolic (congestive) heart failure: Secondary | ICD-10-CM | POA: Diagnosis not present

## 2021-04-28 DIAGNOSIS — J449 Chronic obstructive pulmonary disease, unspecified: Secondary | ICD-10-CM | POA: Diagnosis not present

## 2021-04-28 DIAGNOSIS — E1151 Type 2 diabetes mellitus with diabetic peripheral angiopathy without gangrene: Secondary | ICD-10-CM | POA: Diagnosis not present

## 2021-04-28 DIAGNOSIS — E785 Hyperlipidemia, unspecified: Secondary | ICD-10-CM | POA: Diagnosis not present

## 2021-04-28 DIAGNOSIS — K219 Gastro-esophageal reflux disease without esophagitis: Secondary | ICD-10-CM | POA: Diagnosis not present

## 2021-05-01 ENCOUNTER — Telehealth: Payer: Self-pay

## 2021-05-01 DIAGNOSIS — K219 Gastro-esophageal reflux disease without esophagitis: Secondary | ICD-10-CM | POA: Diagnosis not present

## 2021-05-01 DIAGNOSIS — E785 Hyperlipidemia, unspecified: Secondary | ICD-10-CM | POA: Diagnosis not present

## 2021-05-01 DIAGNOSIS — N1832 Chronic kidney disease, stage 3b: Secondary | ICD-10-CM | POA: Diagnosis not present

## 2021-05-01 DIAGNOSIS — I5022 Chronic systolic (congestive) heart failure: Secondary | ICD-10-CM | POA: Diagnosis not present

## 2021-05-01 DIAGNOSIS — E1122 Type 2 diabetes mellitus with diabetic chronic kidney disease: Secondary | ICD-10-CM | POA: Diagnosis not present

## 2021-05-01 DIAGNOSIS — J449 Chronic obstructive pulmonary disease, unspecified: Secondary | ICD-10-CM | POA: Diagnosis not present

## 2021-05-01 DIAGNOSIS — D631 Anemia in chronic kidney disease: Secondary | ICD-10-CM | POA: Diagnosis not present

## 2021-05-01 DIAGNOSIS — E1151 Type 2 diabetes mellitus with diabetic peripheral angiopathy without gangrene: Secondary | ICD-10-CM | POA: Diagnosis not present

## 2021-05-01 DIAGNOSIS — I480 Paroxysmal atrial fibrillation: Secondary | ICD-10-CM | POA: Diagnosis not present

## 2021-05-01 DIAGNOSIS — I130 Hypertensive heart and chronic kidney disease with heart failure and stage 1 through stage 4 chronic kidney disease, or unspecified chronic kidney disease: Secondary | ICD-10-CM | POA: Diagnosis not present

## 2021-05-01 NOTE — Telephone Encounter (Signed)
Spoke with patient regarding PC. She declined services at this time. She states she has much improved the past couple weeks. Will cancel referral and notify Dr. Jacelyn Grip

## 2021-05-02 ENCOUNTER — Ambulatory Visit (INDEPENDENT_AMBULATORY_CARE_PROVIDER_SITE_OTHER): Payer: Medicare HMO

## 2021-05-02 DIAGNOSIS — N184 Chronic kidney disease, stage 4 (severe): Secondary | ICD-10-CM | POA: Diagnosis not present

## 2021-05-02 DIAGNOSIS — D500 Iron deficiency anemia secondary to blood loss (chronic): Secondary | ICD-10-CM | POA: Diagnosis not present

## 2021-05-02 DIAGNOSIS — E785 Hyperlipidemia, unspecified: Secondary | ICD-10-CM | POA: Diagnosis not present

## 2021-05-02 DIAGNOSIS — I5040 Unspecified combined systolic (congestive) and diastolic (congestive) heart failure: Secondary | ICD-10-CM | POA: Diagnosis not present

## 2021-05-02 DIAGNOSIS — I5022 Chronic systolic (congestive) heart failure: Secondary | ICD-10-CM | POA: Diagnosis not present

## 2021-05-02 DIAGNOSIS — M810 Age-related osteoporosis without current pathological fracture: Secondary | ICD-10-CM | POA: Diagnosis not present

## 2021-05-02 DIAGNOSIS — E119 Type 2 diabetes mellitus without complications: Secondary | ICD-10-CM | POA: Diagnosis not present

## 2021-05-02 DIAGNOSIS — E1122 Type 2 diabetes mellitus with diabetic chronic kidney disease: Secondary | ICD-10-CM | POA: Diagnosis not present

## 2021-05-02 DIAGNOSIS — J449 Chronic obstructive pulmonary disease, unspecified: Secondary | ICD-10-CM | POA: Diagnosis not present

## 2021-05-02 DIAGNOSIS — Z950 Presence of cardiac pacemaker: Secondary | ICD-10-CM | POA: Diagnosis not present

## 2021-05-02 DIAGNOSIS — I10 Essential (primary) hypertension: Secondary | ICD-10-CM | POA: Diagnosis not present

## 2021-05-02 LAB — CUP PACEART REMOTE DEVICE CHECK
Battery Remaining Longevity: 98 mo
Battery Voltage: 2.99 V
Brady Statistic AP VP Percent: 53.92 %
Brady Statistic AP VS Percent: 0.1 %
Brady Statistic AS VP Percent: 36.81 %
Brady Statistic AS VS Percent: 9.16 %
Brady Statistic RA Percent Paced: 53.37 %
Brady Statistic RV Percent Paced: 90.26 %
Date Time Interrogation Session: 20220712060346
Implantable Lead Implant Date: 20201002
Implantable Lead Implant Date: 20201002
Implantable Lead Implant Date: 20201002
Implantable Lead Location: 753858
Implantable Lead Location: 753859
Implantable Lead Location: 753860
Implantable Lead Model: 4598
Implantable Lead Model: 5076
Implantable Lead Model: 5076
Implantable Pulse Generator Implant Date: 20201002
Lead Channel Impedance Value: 285 Ohm
Lead Channel Impedance Value: 323 Ohm
Lead Channel Impedance Value: 323 Ohm
Lead Channel Impedance Value: 323 Ohm
Lead Channel Impedance Value: 342 Ohm
Lead Channel Impedance Value: 361 Ohm
Lead Channel Impedance Value: 399 Ohm
Lead Channel Impedance Value: 475 Ohm
Lead Channel Impedance Value: 513 Ohm
Lead Channel Impedance Value: 532 Ohm
Lead Channel Impedance Value: 532 Ohm
Lead Channel Impedance Value: 532 Ohm
Lead Channel Impedance Value: 551 Ohm
Lead Channel Impedance Value: 551 Ohm
Lead Channel Pacing Threshold Amplitude: 0.625 V
Lead Channel Pacing Threshold Amplitude: 0.625 V
Lead Channel Pacing Threshold Amplitude: 2.25 V
Lead Channel Pacing Threshold Pulse Width: 0.4 ms
Lead Channel Pacing Threshold Pulse Width: 0.4 ms
Lead Channel Pacing Threshold Pulse Width: 0.4 ms
Lead Channel Sensing Intrinsic Amplitude: 1.875 mV
Lead Channel Sensing Intrinsic Amplitude: 1.875 mV
Lead Channel Sensing Intrinsic Amplitude: 27.375 mV
Lead Channel Sensing Intrinsic Amplitude: 27.375 mV
Lead Channel Setting Pacing Amplitude: 1.5 V
Lead Channel Setting Pacing Amplitude: 2 V
Lead Channel Setting Pacing Amplitude: 2 V
Lead Channel Setting Pacing Pulse Width: 0.4 ms
Lead Channel Setting Pacing Pulse Width: 0.4 ms
Lead Channel Setting Sensing Sensitivity: 1.2 mV

## 2021-05-03 ENCOUNTER — Telehealth: Payer: Self-pay | Admitting: Cardiology

## 2021-05-03 NOTE — Telephone Encounter (Signed)
Patient returning call.

## 2021-05-03 NOTE — Telephone Encounter (Signed)
See 7/12 device transmission for further documentation on this

## 2021-05-04 DIAGNOSIS — N1832 Chronic kidney disease, stage 3b: Secondary | ICD-10-CM | POA: Diagnosis not present

## 2021-05-04 DIAGNOSIS — E1122 Type 2 diabetes mellitus with diabetic chronic kidney disease: Secondary | ICD-10-CM | POA: Diagnosis not present

## 2021-05-04 DIAGNOSIS — J449 Chronic obstructive pulmonary disease, unspecified: Secondary | ICD-10-CM | POA: Diagnosis not present

## 2021-05-04 DIAGNOSIS — K219 Gastro-esophageal reflux disease without esophagitis: Secondary | ICD-10-CM | POA: Diagnosis not present

## 2021-05-04 DIAGNOSIS — E1151 Type 2 diabetes mellitus with diabetic peripheral angiopathy without gangrene: Secondary | ICD-10-CM | POA: Diagnosis not present

## 2021-05-04 DIAGNOSIS — D631 Anemia in chronic kidney disease: Secondary | ICD-10-CM | POA: Diagnosis not present

## 2021-05-04 DIAGNOSIS — E785 Hyperlipidemia, unspecified: Secondary | ICD-10-CM | POA: Diagnosis not present

## 2021-05-04 DIAGNOSIS — I5022 Chronic systolic (congestive) heart failure: Secondary | ICD-10-CM | POA: Diagnosis not present

## 2021-05-04 DIAGNOSIS — I130 Hypertensive heart and chronic kidney disease with heart failure and stage 1 through stage 4 chronic kidney disease, or unspecified chronic kidney disease: Secondary | ICD-10-CM | POA: Diagnosis not present

## 2021-05-04 DIAGNOSIS — I480 Paroxysmal atrial fibrillation: Secondary | ICD-10-CM | POA: Diagnosis not present

## 2021-05-08 DIAGNOSIS — N184 Chronic kidney disease, stage 4 (severe): Secondary | ICD-10-CM | POA: Diagnosis not present

## 2021-05-08 DIAGNOSIS — I5022 Chronic systolic (congestive) heart failure: Secondary | ICD-10-CM | POA: Diagnosis not present

## 2021-05-08 DIAGNOSIS — I129 Hypertensive chronic kidney disease with stage 1 through stage 4 chronic kidney disease, or unspecified chronic kidney disease: Secondary | ICD-10-CM | POA: Diagnosis not present

## 2021-05-08 DIAGNOSIS — I4891 Unspecified atrial fibrillation: Secondary | ICD-10-CM | POA: Diagnosis not present

## 2021-05-08 DIAGNOSIS — K2210 Ulcer of esophagus without bleeding: Secondary | ICD-10-CM | POA: Diagnosis not present

## 2021-05-08 DIAGNOSIS — D509 Iron deficiency anemia, unspecified: Secondary | ICD-10-CM | POA: Diagnosis not present

## 2021-05-09 ENCOUNTER — Other Ambulatory Visit: Payer: Self-pay | Admitting: Gastroenterology

## 2021-05-09 DIAGNOSIS — I480 Paroxysmal atrial fibrillation: Secondary | ICD-10-CM | POA: Diagnosis not present

## 2021-05-09 DIAGNOSIS — I5022 Chronic systolic (congestive) heart failure: Secondary | ICD-10-CM | POA: Diagnosis not present

## 2021-05-09 DIAGNOSIS — N1832 Chronic kidney disease, stage 3b: Secondary | ICD-10-CM | POA: Diagnosis not present

## 2021-05-09 DIAGNOSIS — E1122 Type 2 diabetes mellitus with diabetic chronic kidney disease: Secondary | ICD-10-CM | POA: Diagnosis not present

## 2021-05-09 DIAGNOSIS — I4891 Unspecified atrial fibrillation: Secondary | ICD-10-CM | POA: Diagnosis not present

## 2021-05-09 DIAGNOSIS — J449 Chronic obstructive pulmonary disease, unspecified: Secondary | ICD-10-CM | POA: Diagnosis not present

## 2021-05-09 DIAGNOSIS — N184 Chronic kidney disease, stage 4 (severe): Secondary | ICD-10-CM | POA: Diagnosis not present

## 2021-05-09 DIAGNOSIS — K219 Gastro-esophageal reflux disease without esophagitis: Secondary | ICD-10-CM | POA: Diagnosis not present

## 2021-05-09 DIAGNOSIS — E1151 Type 2 diabetes mellitus with diabetic peripheral angiopathy without gangrene: Secondary | ICD-10-CM | POA: Diagnosis not present

## 2021-05-09 DIAGNOSIS — I130 Hypertensive heart and chronic kidney disease with heart failure and stage 1 through stage 4 chronic kidney disease, or unspecified chronic kidney disease: Secondary | ICD-10-CM | POA: Diagnosis not present

## 2021-05-09 DIAGNOSIS — K209 Esophagitis, unspecified without bleeding: Secondary | ICD-10-CM | POA: Diagnosis not present

## 2021-05-09 DIAGNOSIS — D631 Anemia in chronic kidney disease: Secondary | ICD-10-CM | POA: Diagnosis not present

## 2021-05-09 DIAGNOSIS — K222 Esophageal obstruction: Secondary | ICD-10-CM | POA: Diagnosis not present

## 2021-05-09 DIAGNOSIS — E785 Hyperlipidemia, unspecified: Secondary | ICD-10-CM | POA: Diagnosis not present

## 2021-05-10 ENCOUNTER — Telehealth: Payer: Self-pay | Admitting: *Deleted

## 2021-05-10 DIAGNOSIS — N1832 Chronic kidney disease, stage 3b: Secondary | ICD-10-CM | POA: Diagnosis not present

## 2021-05-10 DIAGNOSIS — I130 Hypertensive heart and chronic kidney disease with heart failure and stage 1 through stage 4 chronic kidney disease, or unspecified chronic kidney disease: Secondary | ICD-10-CM | POA: Diagnosis not present

## 2021-05-10 DIAGNOSIS — I480 Paroxysmal atrial fibrillation: Secondary | ICD-10-CM | POA: Diagnosis not present

## 2021-05-10 DIAGNOSIS — E1151 Type 2 diabetes mellitus with diabetic peripheral angiopathy without gangrene: Secondary | ICD-10-CM | POA: Diagnosis not present

## 2021-05-10 DIAGNOSIS — I5022 Chronic systolic (congestive) heart failure: Secondary | ICD-10-CM | POA: Diagnosis not present

## 2021-05-10 DIAGNOSIS — E785 Hyperlipidemia, unspecified: Secondary | ICD-10-CM | POA: Diagnosis not present

## 2021-05-10 DIAGNOSIS — D631 Anemia in chronic kidney disease: Secondary | ICD-10-CM | POA: Diagnosis not present

## 2021-05-10 DIAGNOSIS — J449 Chronic obstructive pulmonary disease, unspecified: Secondary | ICD-10-CM | POA: Diagnosis not present

## 2021-05-10 DIAGNOSIS — K219 Gastro-esophageal reflux disease without esophagitis: Secondary | ICD-10-CM | POA: Diagnosis not present

## 2021-05-10 DIAGNOSIS — E1122 Type 2 diabetes mellitus with diabetic chronic kidney disease: Secondary | ICD-10-CM | POA: Diagnosis not present

## 2021-05-10 NOTE — Telephone Encounter (Signed)
   [facility] Pre-operative Risk Assessment    Patient Name: Krista Mason  DOB: August 10, 1936 MRN: [id number]  HEARTCARE STAFF:  - IMPORTANT!!!!!! Under Visit Info/Reason for Call, type in Other and utilize the format Clearance MM/DD/YY or Clearance TBD. Do not use dashes or single digits. - Please review there is not already an duplicate clearance open for this procedure. - If request is for dental extraction, please clarify the # of teeth to be extracted. - If the patient is currently at the dentist's office, call Pre-Op Callback Staff (MA/nurse) to input urgent request.  - If the patient is not currently in the dentist office, please route to the Pre-Op pool.  Request for surgical clearance:  What type of surgery is being performed? ENDOSCOPY  When is this surgery scheduled? 06/22/21  What type of clearance is required (medical clearance vs. Pharmacy clearance to hold med vs. Both)? BOTH  Are there any medications that need to be held prior to surgery and how [place]? ELIQUIS x 1 DAY PRIOR TO PROCEDURE  Practice name and name of physician performing surgery? [facility]; DR. Cristina Gong  What is the office phone number? [phone]   7.   What is the office fax number? [phone]  8.   Anesthesia type (None, local, MAC, general) ? PROPOFOL   Julaine Hua 05/10/2021, 10:40 AM  _________________________________________________________________   (provider comments below)

## 2021-05-12 DIAGNOSIS — I5022 Chronic systolic (congestive) heart failure: Secondary | ICD-10-CM | POA: Diagnosis not present

## 2021-05-12 DIAGNOSIS — E1151 Type 2 diabetes mellitus with diabetic peripheral angiopathy without gangrene: Secondary | ICD-10-CM | POA: Diagnosis not present

## 2021-05-12 DIAGNOSIS — E1122 Type 2 diabetes mellitus with diabetic chronic kidney disease: Secondary | ICD-10-CM | POA: Diagnosis not present

## 2021-05-12 DIAGNOSIS — E785 Hyperlipidemia, unspecified: Secondary | ICD-10-CM | POA: Diagnosis not present

## 2021-05-12 DIAGNOSIS — I130 Hypertensive heart and chronic kidney disease with heart failure and stage 1 through stage 4 chronic kidney disease, or unspecified chronic kidney disease: Secondary | ICD-10-CM | POA: Diagnosis not present

## 2021-05-12 DIAGNOSIS — K219 Gastro-esophageal reflux disease without esophagitis: Secondary | ICD-10-CM | POA: Diagnosis not present

## 2021-05-12 DIAGNOSIS — D631 Anemia in chronic kidney disease: Secondary | ICD-10-CM | POA: Diagnosis not present

## 2021-05-12 DIAGNOSIS — I480 Paroxysmal atrial fibrillation: Secondary | ICD-10-CM | POA: Diagnosis not present

## 2021-05-12 DIAGNOSIS — J449 Chronic obstructive pulmonary disease, unspecified: Secondary | ICD-10-CM | POA: Diagnosis not present

## 2021-05-12 DIAGNOSIS — N1832 Chronic kidney disease, stage 3b: Secondary | ICD-10-CM | POA: Diagnosis not present

## 2021-05-12 NOTE — Telephone Encounter (Signed)
   Name: Krista Mason  DOB: 11/02/1935  MRN: [id number]   Primary Cardiologist: Glori Bickers, MD  Chart reviewed as part of pre-operative protocol coverage for endoscopy 06/22/2021.  She has history of hypertension, PVCs, CHF, hyperlipidemia, DM2, CKD, histoplasmosis, and tobacco use.  She is s/p PVC ablation 11/2016.  She is s/p Medtronic CRT-D-PE 07/2019.  Krista Mason was last seen on 12/12/20 by Dr. Curt Bears.  She denied chest pain or shortness of breath.  Amiodarone was decreased to every other day.  Patient was contacted 05/12/2021 in reference to pre-operative risk assessment for pending surgery as outlined below.  Since that day, [place] reports that she has been having DOE with exertion, though stable for the last 3 years. She states she is walking to the cafeteria this week and having to stop once or twice for catching breath, though states this is still a very big improvement from even when she saw Dr. Curt Bears. She reports occasional palpitations as well, though states those are unchanged. Overall, no new sx. We discussed the below recommendations for her [place].   Based on ACC/AHA guidelines, the patient would be at acceptable risk for the planned procedure without further cardiovascular testing.   The patient was advised that if she develops new symptoms prior to surgery to contact our office to arrange for a follow-up visit, and she verbalized understanding.  She should hold her Eliquis for 1 day prior to the procedure with restart per the GI team.  I will route this recommendation to the requesting party via [place] fax function and remove from pre-op pool. Please call with questions.  Arvil Chaco, PA-C 05/12/2021, 9:37 AM

## 2021-05-12 NOTE — Telephone Encounter (Signed)
Patient with diagnosis of A Fib on Eliquis for anticoagulation.    Procedure: endoscopy Date of procedure: 06/22/21   CHA2DS2-VASc Score = 7  This indicates a 11.2% annual risk of stroke. The patient's score is based upon: CHF History: Yes HTN History: Yes Diabetes History: Yes Stroke History: No Vascular Disease History: Yes Age Score: 2 Gender Score: 1    CrCl 20 mL/min Platelet count 316K   Per office protocol, patient can hold Eliquis for 1 day prior to procedure.

## 2021-05-15 DIAGNOSIS — E785 Hyperlipidemia, unspecified: Secondary | ICD-10-CM | POA: Diagnosis not present

## 2021-05-15 DIAGNOSIS — E1122 Type 2 diabetes mellitus with diabetic chronic kidney disease: Secondary | ICD-10-CM | POA: Diagnosis not present

## 2021-05-15 DIAGNOSIS — I5022 Chronic systolic (congestive) heart failure: Secondary | ICD-10-CM | POA: Diagnosis not present

## 2021-05-15 DIAGNOSIS — E1151 Type 2 diabetes mellitus with diabetic peripheral angiopathy without gangrene: Secondary | ICD-10-CM | POA: Diagnosis not present

## 2021-05-15 DIAGNOSIS — J449 Chronic obstructive pulmonary disease, unspecified: Secondary | ICD-10-CM | POA: Diagnosis not present

## 2021-05-15 DIAGNOSIS — K219 Gastro-esophageal reflux disease without esophagitis: Secondary | ICD-10-CM | POA: Diagnosis not present

## 2021-05-15 DIAGNOSIS — I130 Hypertensive heart and chronic kidney disease with heart failure and stage 1 through stage 4 chronic kidney disease, or unspecified chronic kidney disease: Secondary | ICD-10-CM | POA: Diagnosis not present

## 2021-05-15 DIAGNOSIS — N1832 Chronic kidney disease, stage 3b: Secondary | ICD-10-CM | POA: Diagnosis not present

## 2021-05-15 DIAGNOSIS — D631 Anemia in chronic kidney disease: Secondary | ICD-10-CM | POA: Diagnosis not present

## 2021-05-15 DIAGNOSIS — I480 Paroxysmal atrial fibrillation: Secondary | ICD-10-CM | POA: Diagnosis not present

## 2021-05-16 DIAGNOSIS — I130 Hypertensive heart and chronic kidney disease with heart failure and stage 1 through stage 4 chronic kidney disease, or unspecified chronic kidney disease: Secondary | ICD-10-CM | POA: Diagnosis not present

## 2021-05-16 DIAGNOSIS — I480 Paroxysmal atrial fibrillation: Secondary | ICD-10-CM | POA: Diagnosis not present

## 2021-05-16 DIAGNOSIS — E1151 Type 2 diabetes mellitus with diabetic peripheral angiopathy without gangrene: Secondary | ICD-10-CM | POA: Diagnosis not present

## 2021-05-16 DIAGNOSIS — K219 Gastro-esophageal reflux disease without esophagitis: Secondary | ICD-10-CM | POA: Diagnosis not present

## 2021-05-16 DIAGNOSIS — I5022 Chronic systolic (congestive) heart failure: Secondary | ICD-10-CM | POA: Diagnosis not present

## 2021-05-16 DIAGNOSIS — D631 Anemia in chronic kidney disease: Secondary | ICD-10-CM | POA: Diagnosis not present

## 2021-05-16 DIAGNOSIS — J449 Chronic obstructive pulmonary disease, unspecified: Secondary | ICD-10-CM | POA: Diagnosis not present

## 2021-05-16 DIAGNOSIS — N1832 Chronic kidney disease, stage 3b: Secondary | ICD-10-CM | POA: Diagnosis not present

## 2021-05-16 DIAGNOSIS — E785 Hyperlipidemia, unspecified: Secondary | ICD-10-CM | POA: Diagnosis not present

## 2021-05-16 DIAGNOSIS — E1122 Type 2 diabetes mellitus with diabetic chronic kidney disease: Secondary | ICD-10-CM | POA: Diagnosis not present

## 2021-05-17 ENCOUNTER — Ambulatory Visit (HOSPITAL_COMMUNITY)
Admission: RE | Admit: 2021-05-17 | Discharge: 2021-05-17 | Disposition: A | Discharge status: Home / Self Care | Payer: Medicare HMO | Source: Ambulatory Visit | Attending: Nephrology | Admitting: Nephrology

## 2021-05-17 VITALS — BP 110/48 | HR 72 | Temp 98.2°F | Resp 20

## 2021-05-17 DIAGNOSIS — N183 Chronic kidney disease, stage 3 unspecified: Secondary | ICD-10-CM

## 2021-05-17 LAB — POCT HEMOGLOBIN-HEMACUE: Hemoglobin: 8.6 g/dL — ABNORMAL LOW (ref 12.0–15.0)

## 2021-05-17 MED ORDER — DARBEPOETIN ALFA 100 MCG/0.5ML IJ SOSY
PREFILLED_SYRINGE | INTRAMUSCULAR | Status: AC
  Filled 2021-05-17: qty 0.5

## 2021-05-17 MED ORDER — DARBEPOETIN ALFA 100 MCG/0.5ML IJ SOSY
100.0000 ug | PREFILLED_SYRINGE | INTRAMUSCULAR | Status: DC
  Administered 2021-05-17: 100 ug via SUBCUTANEOUS

## 2021-05-17 MED ORDER — FERUMOXYTOL INJECTION 510 MG/17 ML
510.0000 mg | INTRAVENOUS | Status: DC
  Administered 2021-05-17: 510 mg via INTRAVENOUS
  Filled 2021-05-17: qty 510

## 2021-05-18 DIAGNOSIS — N1832 Chronic kidney disease, stage 3b: Secondary | ICD-10-CM | POA: Diagnosis not present

## 2021-05-18 DIAGNOSIS — J449 Chronic obstructive pulmonary disease, unspecified: Secondary | ICD-10-CM | POA: Diagnosis not present

## 2021-05-18 DIAGNOSIS — I5022 Chronic systolic (congestive) heart failure: Secondary | ICD-10-CM | POA: Diagnosis not present

## 2021-05-18 DIAGNOSIS — E1151 Type 2 diabetes mellitus with diabetic peripheral angiopathy without gangrene: Secondary | ICD-10-CM | POA: Diagnosis not present

## 2021-05-18 DIAGNOSIS — K219 Gastro-esophageal reflux disease without esophagitis: Secondary | ICD-10-CM | POA: Diagnosis not present

## 2021-05-18 DIAGNOSIS — E785 Hyperlipidemia, unspecified: Secondary | ICD-10-CM | POA: Diagnosis not present

## 2021-05-18 DIAGNOSIS — E1122 Type 2 diabetes mellitus with diabetic chronic kidney disease: Secondary | ICD-10-CM | POA: Diagnosis not present

## 2021-05-18 DIAGNOSIS — D631 Anemia in chronic kidney disease: Secondary | ICD-10-CM | POA: Diagnosis not present

## 2021-05-18 DIAGNOSIS — I130 Hypertensive heart and chronic kidney disease with heart failure and stage 1 through stage 4 chronic kidney disease, or unspecified chronic kidney disease: Secondary | ICD-10-CM | POA: Diagnosis not present

## 2021-05-18 DIAGNOSIS — I480 Paroxysmal atrial fibrillation: Secondary | ICD-10-CM | POA: Diagnosis not present

## 2021-05-23 DIAGNOSIS — E1122 Type 2 diabetes mellitus with diabetic chronic kidney disease: Secondary | ICD-10-CM | POA: Diagnosis not present

## 2021-05-23 DIAGNOSIS — E1151 Type 2 diabetes mellitus with diabetic peripheral angiopathy without gangrene: Secondary | ICD-10-CM | POA: Diagnosis not present

## 2021-05-23 DIAGNOSIS — I130 Hypertensive heart and chronic kidney disease with heart failure and stage 1 through stage 4 chronic kidney disease, or unspecified chronic kidney disease: Secondary | ICD-10-CM | POA: Diagnosis not present

## 2021-05-23 DIAGNOSIS — E785 Hyperlipidemia, unspecified: Secondary | ICD-10-CM | POA: Diagnosis not present

## 2021-05-23 DIAGNOSIS — N1832 Chronic kidney disease, stage 3b: Secondary | ICD-10-CM | POA: Diagnosis not present

## 2021-05-23 DIAGNOSIS — K219 Gastro-esophageal reflux disease without esophagitis: Secondary | ICD-10-CM | POA: Diagnosis not present

## 2021-05-23 DIAGNOSIS — I5022 Chronic systolic (congestive) heart failure: Secondary | ICD-10-CM | POA: Diagnosis not present

## 2021-05-23 DIAGNOSIS — I480 Paroxysmal atrial fibrillation: Secondary | ICD-10-CM | POA: Diagnosis not present

## 2021-05-23 DIAGNOSIS — D631 Anemia in chronic kidney disease: Secondary | ICD-10-CM | POA: Diagnosis not present

## 2021-05-23 DIAGNOSIS — J449 Chronic obstructive pulmonary disease, unspecified: Secondary | ICD-10-CM | POA: Diagnosis not present

## 2021-05-24 ENCOUNTER — Other Ambulatory Visit: Payer: Self-pay

## 2021-05-25 DIAGNOSIS — E785 Hyperlipidemia, unspecified: Secondary | ICD-10-CM | POA: Diagnosis not present

## 2021-05-25 DIAGNOSIS — D631 Anemia in chronic kidney disease: Secondary | ICD-10-CM | POA: Diagnosis not present

## 2021-05-25 DIAGNOSIS — I5022 Chronic systolic (congestive) heart failure: Secondary | ICD-10-CM | POA: Diagnosis not present

## 2021-05-25 DIAGNOSIS — J449 Chronic obstructive pulmonary disease, unspecified: Secondary | ICD-10-CM | POA: Diagnosis not present

## 2021-05-25 DIAGNOSIS — E1151 Type 2 diabetes mellitus with diabetic peripheral angiopathy without gangrene: Secondary | ICD-10-CM | POA: Diagnosis not present

## 2021-05-25 DIAGNOSIS — I480 Paroxysmal atrial fibrillation: Secondary | ICD-10-CM | POA: Diagnosis not present

## 2021-05-25 DIAGNOSIS — N1832 Chronic kidney disease, stage 3b: Secondary | ICD-10-CM | POA: Diagnosis not present

## 2021-05-25 DIAGNOSIS — E1122 Type 2 diabetes mellitus with diabetic chronic kidney disease: Secondary | ICD-10-CM | POA: Diagnosis not present

## 2021-05-25 DIAGNOSIS — I130 Hypertensive heart and chronic kidney disease with heart failure and stage 1 through stage 4 chronic kidney disease, or unspecified chronic kidney disease: Secondary | ICD-10-CM | POA: Diagnosis not present

## 2021-05-25 DIAGNOSIS — K219 Gastro-esophageal reflux disease without esophagitis: Secondary | ICD-10-CM | POA: Diagnosis not present

## 2021-05-25 NOTE — Progress Notes (Signed)
Remote pacemaker transmission.   

## 2021-05-29 DIAGNOSIS — I130 Hypertensive heart and chronic kidney disease with heart failure and stage 1 through stage 4 chronic kidney disease, or unspecified chronic kidney disease: Secondary | ICD-10-CM | POA: Diagnosis not present

## 2021-05-29 DIAGNOSIS — D631 Anemia in chronic kidney disease: Secondary | ICD-10-CM | POA: Diagnosis not present

## 2021-05-29 DIAGNOSIS — N1832 Chronic kidney disease, stage 3b: Secondary | ICD-10-CM | POA: Diagnosis not present

## 2021-05-29 DIAGNOSIS — J449 Chronic obstructive pulmonary disease, unspecified: Secondary | ICD-10-CM | POA: Diagnosis not present

## 2021-05-29 DIAGNOSIS — D500 Iron deficiency anemia secondary to blood loss (chronic): Secondary | ICD-10-CM | POA: Diagnosis not present

## 2021-05-29 DIAGNOSIS — E1151 Type 2 diabetes mellitus with diabetic peripheral angiopathy without gangrene: Secondary | ICD-10-CM | POA: Diagnosis not present

## 2021-05-29 DIAGNOSIS — I5022 Chronic systolic (congestive) heart failure: Secondary | ICD-10-CM | POA: Diagnosis not present

## 2021-05-29 DIAGNOSIS — E785 Hyperlipidemia, unspecified: Secondary | ICD-10-CM | POA: Diagnosis not present

## 2021-05-29 DIAGNOSIS — E1122 Type 2 diabetes mellitus with diabetic chronic kidney disease: Secondary | ICD-10-CM | POA: Diagnosis not present

## 2021-05-29 DIAGNOSIS — K219 Gastro-esophageal reflux disease without esophagitis: Secondary | ICD-10-CM | POA: Diagnosis not present

## 2021-05-29 DIAGNOSIS — I480 Paroxysmal atrial fibrillation: Secondary | ICD-10-CM | POA: Diagnosis not present

## 2021-05-30 DIAGNOSIS — E1122 Type 2 diabetes mellitus with diabetic chronic kidney disease: Secondary | ICD-10-CM | POA: Diagnosis not present

## 2021-05-30 DIAGNOSIS — K219 Gastro-esophageal reflux disease without esophagitis: Secondary | ICD-10-CM | POA: Diagnosis not present

## 2021-05-30 DIAGNOSIS — E1151 Type 2 diabetes mellitus with diabetic peripheral angiopathy without gangrene: Secondary | ICD-10-CM | POA: Diagnosis not present

## 2021-05-30 DIAGNOSIS — J449 Chronic obstructive pulmonary disease, unspecified: Secondary | ICD-10-CM | POA: Diagnosis not present

## 2021-05-30 DIAGNOSIS — I5022 Chronic systolic (congestive) heart failure: Secondary | ICD-10-CM | POA: Diagnosis not present

## 2021-05-30 DIAGNOSIS — E785 Hyperlipidemia, unspecified: Secondary | ICD-10-CM | POA: Diagnosis not present

## 2021-05-30 DIAGNOSIS — I130 Hypertensive heart and chronic kidney disease with heart failure and stage 1 through stage 4 chronic kidney disease, or unspecified chronic kidney disease: Secondary | ICD-10-CM | POA: Diagnosis not present

## 2021-05-30 DIAGNOSIS — I480 Paroxysmal atrial fibrillation: Secondary | ICD-10-CM | POA: Diagnosis not present

## 2021-05-30 DIAGNOSIS — D631 Anemia in chronic kidney disease: Secondary | ICD-10-CM | POA: Diagnosis not present

## 2021-05-30 DIAGNOSIS — N1832 Chronic kidney disease, stage 3b: Secondary | ICD-10-CM | POA: Diagnosis not present

## 2021-05-31 ENCOUNTER — Other Ambulatory Visit: Payer: Self-pay

## 2021-05-31 ENCOUNTER — Ambulatory Visit (HOSPITAL_COMMUNITY): Payer: Medicare HMO | Admitting: Anesthesiology

## 2021-05-31 ENCOUNTER — Encounter (HOSPITAL_COMMUNITY)
Admission: RE | Disposition: A | Discharge status: Home / Self Care | Payer: Self-pay | Source: Home / Self Care | Attending: Gastroenterology

## 2021-05-31 ENCOUNTER — Encounter (HOSPITAL_COMMUNITY): Payer: Self-pay | Admitting: Gastroenterology

## 2021-05-31 ENCOUNTER — Ambulatory Visit (HOSPITAL_COMMUNITY)
Admission: RE | Admit: 2021-05-31 | Discharge: 2021-05-31 | Disposition: A | Discharge status: Home / Self Care | Payer: Medicare HMO | Attending: Gastroenterology | Admitting: Gastroenterology

## 2021-05-31 DIAGNOSIS — Z888 Allergy status to other drugs, medicaments and biological substances status: Secondary | ICD-10-CM | POA: Insufficient documentation

## 2021-05-31 DIAGNOSIS — K259 Gastric ulcer, unspecified as acute or chronic, without hemorrhage or perforation: Secondary | ICD-10-CM | POA: Diagnosis not present

## 2021-05-31 DIAGNOSIS — Z87891 Personal history of nicotine dependence: Secondary | ICD-10-CM | POA: Insufficient documentation

## 2021-05-31 DIAGNOSIS — K2100 Gastro-esophageal reflux disease with esophagitis, without bleeding: Secondary | ICD-10-CM | POA: Diagnosis not present

## 2021-05-31 DIAGNOSIS — K2210 Ulcer of esophagus without bleeding: Secondary | ICD-10-CM | POA: Insufficient documentation

## 2021-05-31 DIAGNOSIS — Z950 Presence of cardiac pacemaker: Secondary | ICD-10-CM | POA: Diagnosis not present

## 2021-05-31 DIAGNOSIS — Z885 Allergy status to narcotic agent status: Secondary | ICD-10-CM | POA: Diagnosis not present

## 2021-05-31 DIAGNOSIS — Z886 Allergy status to analgesic agent status: Secondary | ICD-10-CM | POA: Diagnosis not present

## 2021-05-31 DIAGNOSIS — Z887 Allergy status to serum and vaccine status: Secondary | ICD-10-CM | POA: Diagnosis not present

## 2021-05-31 DIAGNOSIS — Z79899 Other long term (current) drug therapy: Secondary | ICD-10-CM | POA: Diagnosis not present

## 2021-05-31 DIAGNOSIS — K222 Esophageal obstruction: Secondary | ICD-10-CM | POA: Diagnosis not present

## 2021-05-31 DIAGNOSIS — K317 Polyp of stomach and duodenum: Secondary | ICD-10-CM | POA: Diagnosis not present

## 2021-05-31 DIAGNOSIS — R1310 Dysphagia, unspecified: Secondary | ICD-10-CM | POA: Diagnosis not present

## 2021-05-31 DIAGNOSIS — Z880 Allergy status to penicillin: Secondary | ICD-10-CM | POA: Diagnosis not present

## 2021-05-31 HISTORY — PX: POLYPECTOMY: SHX5525

## 2021-05-31 HISTORY — PX: ESOPHAGOGASTRODUODENOSCOPY (EGD) WITH PROPOFOL: SHX5813

## 2021-05-31 SURGERY — ESOPHAGOGASTRODUODENOSCOPY (EGD) WITH PROPOFOL
Anesthesia: Monitor Anesthesia Care

## 2021-05-31 MED ORDER — PROPOFOL 500 MG/50ML IV EMUL
INTRAVENOUS | Status: AC
  Filled 2021-05-31: qty 50

## 2021-05-31 MED ORDER — PROPOFOL 500 MG/50ML IV EMUL
INTRAVENOUS | Status: DC | PRN
  Administered 2021-05-31: 100 ug/kg/min via INTRAVENOUS

## 2021-05-31 MED ORDER — PROPOFOL 500 MG/50ML IV EMUL
INTRAVENOUS | Status: DC | PRN
  Administered 2021-05-31: 30 mg via INTRAVENOUS
  Administered 2021-05-31: 20 mg via INTRAVENOUS

## 2021-05-31 MED ORDER — LACTATED RINGERS IV SOLN: INTRAVENOUS | Status: DC | PRN

## 2021-05-31 MED ORDER — PHENYLEPHRINE 40 MCG/ML (10ML) SYRINGE FOR IV PUSH (FOR BLOOD PRESSURE SUPPORT)
PREFILLED_SYRINGE | INTRAVENOUS | Status: DC | PRN
  Administered 2021-05-31: 80 ug via INTRAVENOUS

## 2021-05-31 SURGICAL SUPPLY — 14 items

## 2021-05-31 NOTE — Anesthesia Preprocedure Evaluation (Addendum)
Anesthesia Evaluation  Patient identified by MRN, date of birth, ID band Patient awake    Reviewed: Allergy & Precautions, NPO status , Patient's Chart, lab work & pertinent test results, reviewed documented beta blocker date and time   Airway Mallampati: III  TM Distance: >3 FB Neck ROM: Full    Dental  (+) Edentulous Upper, Edentulous Lower   Pulmonary COPD, former smoker,    Pulmonary exam normal breath sounds clear to auscultation       Cardiovascular hypertension, Pt. on medications + Peripheral Vascular Disease and +CHF  Normal cardiovascular exam+ dysrhythmias (on eliquis) Atrial Fibrillation + pacemaker (for CHB)  Rhythm:Regular Rate:Normal  TTE 2021 1. Left ventricular ejection fraction, by estimation, is 45 to 50%. The  left ventricle has mildly decreased function. The left ventricle  demonstrates global hypokinesis. There is mild concentric left ventricular  hypertrophy. Left ventricular diastolic  function could not be evaluated. Left ventricular diastolic function could  not be evaluated.  2. Right ventricular systolic function is normal. The right ventricular  size is normal. There is normal pulmonary artery systolic pressure. The  estimated right ventricular systolic pressure is 88.5 mmHg.  3. The mitral valve appears rheumatic in the PLAX views, but likely just  degenerative. The mitral valve is degenerative. Mild to moderate mitral  valve regurgitation. Mild mitral stenosis. The mean mitral valve gradient  is 3.5 mmHg with average heart  rate of 70 bpm.  4. The aortic valve is tricuspid. Aortic valve regurgitation is mild.  Mild to moderate aortic valve sclerosis/calcification is present, without  any evidence of aortic stenosis.  5. The inferior vena cava is normal in size with greater than 50%  respiratory variability, suggesting right atrial pressure of 3 mmHg   Neuro/Psych negative neurological ROS   negative psych ROS   GI/Hepatic Neg liver ROS, GERD  ,  Endo/Other  negative endocrine ROSdiabetes  Renal/GU Renal InsufficiencyRenal disease (Cr 1.92)  negative genitourinary   Musculoskeletal negative musculoskeletal ROS (+)   Abdominal   Peds  Hematology  (+) Blood dyscrasia, anemia ,   Anesthesia Other Findings    Reproductive/Obstetrics                            Anesthesia Physical  Anesthesia Plan  ASA: 3  Anesthesia Plan: MAC   Post-op Pain Management:    Induction: Intravenous and Rapid sequence  PONV Risk Score and Plan: 3 and Dexamethasone, Ondansetron and Treatment may vary due to age or medical condition  Airway Management Planned: Natural Airway and Nasal Cannula  Additional Equipment: None  Intra-op Plan:   Post-operative Plan:   Informed Consent: I have reviewed the patients History and Physical, chart, labs and discussed the procedure including the risks, benefits and alternatives for the proposed anesthesia with the patient or authorized representative who has indicated his/her understanding and acceptance.     Dental advisory given  Plan Discussed with: CRNA, Anesthesiologist and Surgeon  Anesthesia Plan Comments:         Anesthesia Quick Evaluation

## 2021-05-31 NOTE — Anesthesia Postprocedure Evaluation (Signed)
Anesthesia Post Note  Patient: Krista Mason  Procedure(s) Performed: ESOPHAGOGASTRODUODENOSCOPY (EGD) WITH PROPOFOL POLYPECTOMY     Patient location during evaluation: Endoscopy Anesthesia Type: MAC Level of consciousness: awake and alert Pain management: pain level controlled Vital Signs Assessment: post-procedure vital signs reviewed and stable Respiratory status: spontaneous breathing and respiratory function stable Cardiovascular status: stable Postop Assessment: no apparent nausea or vomiting Anesthetic complications: no   No notable events documented.  Last Vitals:  Vitals:   05/31/21 1230 05/31/21 1235  BP: (!) 146/44 (!) 158/49  Pulse: 70 70  Resp: 10 13  Temp:    SpO2: 99% 100%    Last Pain:  Vitals:   05/31/21 1235  TempSrc:   PainSc: 0-No pain                 Merlinda Frederick

## 2021-05-31 NOTE — H&P (Signed)
Primary Care Physician:  [facility] [place] Primary Gastroenterologist:  Dr. Cristina Gong  Reason for Visit : Dysphagia  HPI: Krista Mason is a 85 y.o. female with past medical history of CHF, atrial fibrillation on Eliquis, history of esophageal stricture requiring multiple dilation in the past here for outpatient EGD with possible dilation for evaluation of dysphagia.  Was admitted to the hospital in May 2022 with anemia and melena.  EGD on Feb 28, 2021 showed severe distal ulcerative esophagitis with esophageal stricture.  She was seen by Vicie Mutters, PA-C in the office in July 2022.  Repeat EGD with possible dilation was scheduled for evaluation of intermittent melena and ongoing dysphagia.  She has been taking pantoprazole twice a day.  Eliquis on hold since Monday  Past Medical History:  Diagnosis Date   Anemia    Atrial fibrillation (HCC)    Chronic combined systolic and diastolic CHF (congestive heart failure) ([place])    a. LV dysfcuntion felt to be related to PVCs   Chronic kidney disease    Diabetes mellitus without complication (HCC)    GERD (gastroesophageal reflux disease)    Hx of echocardiogram    Echo (1/16):  EF 60-65%, no RWMA, Gr 1 DD, mod AI, mod MR, mild LAE   Hyperkalemia    Hyperlipidemia    Hypertension    NICM (nonischemic cardiomyopathy) ([place])    a. 12/2018 Echo: EF 35%, diff HK w/ septal-lateral dyssynchrony. Nl RV fxn; b. 07/2019 s/p MDT [id number] [name] CRT-P MRI SureScan device (ser #: [id number])   Orthostatic hypotension    PVC (premature ventricular contraction)    a. s/p PVC ablation in 11/2016   Rheumatic fever    Wears dentures     Past Surgical History:  Procedure Laterality Date   BALLOON DILATION N/A 07/12/2020   Procedure: BALLOON DILATION;  Surgeon: Ronald Lobo, MD;  Location: [facility];  Service: Endoscopy;  Laterality: N/A;   BIOPSY  08/20/2019   Procedure: BIOPSY;  Surgeon: Otis Brace, MD;   Location: [place] [facility];  Service: Gastroenterology;;   BIV PACEMAKER INSERTION CRT-P N/A 07/24/2019   Procedure: BIV PACEMAKER INSERTION CRT-P;  Surgeon: Constance Haw, MD;  Location: [place] [facility];  Service: Cardiovascular;  Laterality: N/A;   COLONOSCOPY WITH PROPOFOL N/A 08/22/2019   Procedure: COLONOSCOPY WITH PROPOFOL;  Surgeon: Laurence Spates, MD;  Location: [place];  Service: Endoscopy;  Laterality: N/A;   ESOPHAGOGASTRODUODENOSCOPY N/A 02/28/2021   Procedure: ESOPHAGOGASTRODUODENOSCOPY (EGD);  Surgeon: Wilford Corner, MD;  Location: [place];  Service: Endoscopy;  Laterality: N/A;   ESOPHAGOGASTRODUODENOSCOPY (EGD) WITH PROPOFOL N/A 09/25/2018   Procedure: ESOPHAGOGASTRODUODENOSCOPY (EGD) WITH PROPOFOL;  Surgeon: Ronald Lobo, MD;  [facility];  Service: Endoscopy;  Laterality: N/A;   ESOPHAGOGASTRODUODENOSCOPY (EGD) WITH PROPOFOL N/A 10/29/2018   Procedure: ESOPHAGOGASTRODUODENOSCOPY (EGD) WITH PROPOFOL;  Surgeon: Ronald Lobo, MD;  [facility];  Service: Endoscopy;  Laterality: N/A;   ESOPHAGOGASTRODUODENOSCOPY (EGD) WITH PROPOFOL N/A 08/20/2019   Procedure: ESOPHAGOGASTRODUODENOSCOPY (EGD) WITH PROPOFOL;  Surgeon: Otis Brace, MD;  Location: [place];  Service: Gastroenterology;  Laterality: N/A;   ESOPHAGOGASTRODUODENOSCOPY (EGD) WITH PROPOFOL N/A 05/03/2020   Procedure: ESOPHAGOGASTRODUODENOSCOPY (EGD) WITH PROPOFOL;  Surgeon: Ronald Lobo, MD;  [facility];  Service: Endoscopy;  Laterality: N/A;   ESOPHAGOGASTRODUODENOSCOPY (EGD) WITH PROPOFOL N/A 06/02/2020   Procedure: ESOPHAGOGASTRODUODENOSCOPY (EGD) WITH PROPOFOL and Savory Dilatation;  Surgeon: Clarene Essex, MD;  [facility];  Service: Gastroenterology;  Laterality: N/A;   ESOPHAGOGASTRODUODENOSCOPY (EGD) WITH  PROPOFOL N/A 07/12/2020   Procedure: ESOPHAGOGASTRODUODENOSCOPY (EGD) WITH PROPOFOL with dilatation;  Surgeon: Ronald Lobo, MD;  [facility];  Service: Endoscopy;  Laterality: N/A;   HEMOSTASIS CLIP PLACEMENT  08/22/2019   Procedure: HEMOSTASIS CLIP PLACEMENT;  Surgeon: Laurence Spates, MD;  Location: [facility];  Service: Endoscopy;;   KNEE SURGERY Left 2009   OVARIAN CYST REMOVAL     POLYPECTOMY  08/22/2019   Procedure: POLYPECTOMY;  Surgeon: Laurence Spates, MD;  Location: [place];  Service: Endoscopy;;   PVC ABLATION N/A 11/30/2016   Procedure: PVC Ablation;  Surgeon: Will [name], MD;  Location: [place] [facility];  Service: Cardiovascular;  Laterality: N/A;   RIGHT HEART CATH N/A 01/31/2018   Procedure: RIGHT HEART CATH;  Surgeon: Jolaine Artist, MD;  Location: [place] [facility];  Service: Cardiovascular;  Laterality: N/A;   RIGHT/LEFT HEART CATH AND CORONARY ANGIOGRAPHY N/A 01/25/2017   Procedure: Right/Left Heart Cath and Coronary Angiography;  Surgeon: Jolaine Artist, MD;  Location: [place] [facility];  Service: Cardiovascular;  Laterality: N/A;   SAVORY DILATION N/A 09/25/2018   Procedure: SAVORY DILATION;  Surgeon: Ronald Lobo, MD;  [facility];  Service: Endoscopy;  Laterality: N/A;   SAVORY DILATION N/A 10/29/2018   Procedure: SAVORY DILATION;  Surgeon: Ronald Lobo, MD;  [facility];  Service: Endoscopy;  Laterality: N/A;   SAVORY DILATION N/A 08/20/2019   Procedure: SAVORY DILATION;  Surgeon: Otis Brace, MD;  Location: [facility];  Service: Gastroenterology;  Laterality: N/A;   SAVORY DILATION N/A 05/03/2020   Procedure: SAVORY DILATION;  Surgeon: Ronald Lobo, MD;  [facility];  Service: Endoscopy;  Laterality: N/A;  Patient needs ultraslim pediatric upper endoscope/     no fluoro per dr. Cristal Deer DILATION N/A 06/02/2020   Procedure: SAVORY DILATION w/ Burtis Junes;  Surgeon: Clarene Essex, MD;  [facility];  Service: Gastroenterology;  Laterality: N/A;    Prior to Admission medications   Medication Sig Start Date End Date  Taking? Authorizing Provider  acetaminophen (TYLENOL) 500 MG tablet Take 1,000 mg by mouth every 6 (six) hours as needed for moderate pain, headache or mild pain.   Yes [provider]  amiodarone (PACERONE) 100 MG tablet TAKE ONE TABLET BY MOUTH EVERY OTHER DAY Patient taking differently: Take 100 mg by mouth every other day. 04/10/21  Yes Camnitz, Will Hassell Done, MD  atorvastatin (LIPITOR) 20 MG tablet Take 1 tablet (20 mg total) by mouth at bedtime. 03/08/21  Yes Bensimhon, Shaune Pascal, MD  carvedilol (COREG) 12.5 MG tablet Take 1 tablet (12.5 mg total) by mouth 2 (two) times daily with a meal. 05/04/20  Yes Bensimhon, Shaune Pascal, MD  cholecalciferol (VITAMIN D3) 25 MCG (1000 UNIT) tablet Take 2,000 Units by mouth every morning.   Yes [provider]  Cyanocobalamin (VITAMIN B-12) 2500 MCG SUBL Take 2,500 mcg by mouth every other day.   Yes [provider]  ELIQUIS 2.5 MG TABS tablet TAKE 1 TABLET BY MOUTH TWICE A DAY Patient taking differently: Take 2.5 mg by mouth 2 (two) times daily with a meal. 06/14/20  Yes Fenton, Clint R, PA  Ensure (ENSURE) Take 237 mLs by mouth daily.   Yes [provider]  hydrALAZINE (APRESOLINE) 50 MG tablet Take 50 mg by mouth 2 (two) times daily with a meal.   Yes [provider]  pantoprazole (PROTONIX) 40 MG tablet Take 40 mg by mouth in the morning and at bedtime.  Yes [provider]  Polyethyl Glycol-Propyl Glycol 0.4-0.3 % SOLN Place 1 drop into both eyes every 8 (eight) hours as needed (dryness).   Yes [provider]  polyethylene glycol (MIRALAX / GLYCOLAX) 17 g packet Take 17 g by mouth daily as needed (constipation.).   Yes [provider]  torsemide (DEMADEX) 20 MG tablet Take 20-40 mg by mouth See admin instructions. Take 1 tablet (20 mg) by mouth scheduled every morning & may take 2 tablets (40 mg) by mouth in the morning if experiencing fluid retention. 11/17/20  Yes [provider]   diphenhydrAMINE (BENADRYL) 25 mg capsule Take 25 mg by mouth every 6 (six) hours as needed (allergies/sleep).    [provider]  ondansetron (ZOFRAN) 4 MG tablet Take 4 mg by mouth every 6 (six) hours as needed for nausea or vomiting.    [provider]    Scheduled Meds: Continuous Infusions: PRN Meds:.  Allergies as of 05/09/2021 - Review Complete 04/19/2021  Allergen Reaction Noted   Epoetin alfa Nausea Only and Other (See Comments) 12/08/2019   Penicillins Itching and Rash 12/15/2012   Retacrit [epoetin (alfa)] Other (See Comments) 12/08/2019   Crestor [rosuvastatin] Nausea Only 02/22/2021   Hydrocodone Itching and Other (See Comments) 02/22/2021   Influenza virus vaccine split Swelling and Other (See Comments) 02/22/2021   Tape Other (See Comments) 02/27/2021   Cefaclor Itching 02/28/2012   Crestor [rosuvastatin calcium] Nausea Only 10/23/2016   Influenza vaccines Swelling and Other (See Comments) 09/17/2018   Lidocaine hcl Palpitations and Other (See Comments) 10/23/2016   Nsaids  12/15/2012   Percocet [oxycodone-acetaminophen] Nausea And Vomiting 02/28/2012   Prednisone Other (See Comments) 12/15/2012    Family History  Problem Relation Age of Onset   Heart attack Father    Heart disease Father    Cancer Father    Hypertension Father    Hypertension Brother    Stroke Brother    Hypertension Brother    Diabetes Son    Hyperlipidemia Son    Hypertension Son     Social History   Socioeconomic History   Marital status: Widowed    Spouse name: Not on file   Number of children: Not on file   Years of education: Not on file   Highest education level: Not on file  Occupational History   Not on file  Tobacco Use   Smoking status: Former    Packs/day: 0.75    Years: 60.00    Pack years: 45.00    Types: Cigarettes    Quit date: 11/04/2016    Years since quitting: 4.5   Smokeless tobacco: Never  Vaping Use   Vaping Use: Never used  Substance  and Sexual Activity   Alcohol use: No   Drug use: No   Sexual activity: Not on file  Other Topics Concern   Not on file  Social History Narrative   Lives at [facility].   Social Determinants of Health   Financial Resource Strain: Not on file  Food Insecurity: Not on file  Transportation Needs: Not on file  Physical Activity: Not on file  Stress: Not on file  Social Connections: Not on file  Intimate Partner Violence: Not on file    Review of Systems: All negative except as stated above in HPI.  Physical Exam: Vital signs: There were no vitals filed for this visit.   General:   Alert,  Well-developed, well-nourished, pleasant and cooperative in NAD Lungs:  Clear throughout  to auscultation.   No wheezes, crackles, or rhonchi. No acute distress. Heart: irregular heart rate Abdomen: Soft, nontender, nondistended, bowel sounds present Rectal:  Deferred  GI:  Lab Results: No results for input(s): WBC, HGB, HCT, PLT in the last 72 hours. BMET No results for input(s): NA, K, CL, CO2, GLUCOSE, BUN, CREATININE, CALCIUM in the last 72 hours. LFT No results for input(s): PROT, ALBUMIN, AST, ALT, ALKPHOS, BILITOT, BILIDIR, IBILI in the last 72 hours. PT/INR No results for input(s): LABPROT, INR in the last 72 hours.   Studies/Results: No results found.  Impression/Plan: -History of esophageal stricture with ongoing dysphagia -H/O  ulcerative esophagitis -Atrial fibrillation. Eliquis on Hold.  Last dose on Monday.  Recommendations ----------------------- -Proceed with EGD with possible dilation.  Risks (bleeding, infection, bowel perforation that could require surgery, sedation-related changes in cardiopulmonary systems), benefits (identification and possible treatment of source of symptoms, exclusion of certain causes of symptoms), and alternatives (watchful waiting, radiographic imaging studies, empiric medical treatment)  were explained to patient/family  in detail and patient wishes to proceed.    LOS: 0 days   Otis Brace  MD, FACP 05/31/2021, 10:38 AM  Contact #  [phone]

## 2021-05-31 NOTE — Op Note (Signed)
[facility] Patient Name: Krista Mason Procedure Date: 05/31/2021 MRN: [id number] Attending MD: Otis Brace , MD Date of Birth: 24-Apr-1936 CSN: [id number] Age: 85 Admit Type: Outpatient Procedure:                Upper GI endoscopy Indications:              Dysphagia, Follow-up of esophageal ulcer Providers:                Otis Brace, MD, Particia Nearing, RN, Elspeth Cho Tech., Technician Referring MD:              Medicines:                Sedation Administered by an Anesthesia Professional Complications:            No immediate complications. Estimated Blood Loss:     Estimated blood loss was minimal. Procedure:                Pre-Anesthesia Assessment:                           - Prior to the procedure, a History and Physical                            was performed, and patient medications and                            allergies were reviewed. The patient's tolerance of                            previous anesthesia was also reviewed. The risks                            and benefits of the procedure and the sedation                            options and risks were discussed with the patient.                            All questions were answered, and informed consent                            was obtained. Prior Anticoagulants: The patient has                            taken Eliquis (apixaban), last dose was 3 days                            prior to procedure. ASA Grade Assessment: III - A                            patient with severe systemic disease. After  reviewing the risks and benefits, the patient was                            deemed in satisfactory condition to undergo the                            procedure.                           After obtaining informed consent, the endoscope was                            passed under direct vision. Throughout the                             procedure, the patient's blood pressure, pulse, and                            oxygen saturations were monitored continuously. The                            [id number]-[id number] ([id number]) Olympus endoscope was introduced                            through the mouth, and advanced to the second part                            of duodenum. The upper GI endoscopy was performed                            with difficulty due to stricture. The patient                            tolerated the procedure well. Scope In: Scope Out: Findings:      One superficial esophageal ulcer with no bleeding and no stigmata of       recent bleeding was found 34 to 38 cm from the incisors, few centimeters       above GE junction.      One benign-appearing, intrinsic moderate (circumferential scarring or       stenosis; an endoscope may pass) stenosis was found in the distal       esophagus. This stenosis measured 6 cm (in length). The stenosis was       traversed. Dilation was not attempted as there was mucosal disruption       and bleeding just from passing scope.      Three small sessile polyps with no bleeding and stigmata of recent       bleeding were found in the gastric fundus. These polyps were removed       with a hot snare. Resection and retrieval were complete.      The cardia and gastric fundus were normal on retroflexion.      The duodenal bulb, first portion of the duodenum and second portion of       the duodenum were normal. Impression:               -  Esophageal ulcer with no bleeding and no stigmata                            of recent bleeding.                           - Benign-appearing esophageal stenosis. Lesion not                            amenable to dilation, and not attempted.                           - Three gastric polyps. Resected and retrieved.                           - Normal duodenal bulb, first portion of the                            duodenum and second portion of the  duodenum. Moderate Sedation:      Moderate (conscious) sedation was personally administered by an       anesthesia professional. The following parameters were monitored: oxygen       saturation, heart rate, blood pressure, and response to care. Recommendation:           - Patient has a contact number available for                            emergencies. The signs and symptoms of potential                            delayed complications were discussed with the                            patient. Return to normal activities tomorrow.                            Written discharge instructions were provided to the                            patient.                           - Resume previous diet.                           - Continue present medications.                           - Await pathology results.                           - Repeat upper endoscopy in 3 months to check                            healing.                           -  Return to GI clinic as previously scheduled. Procedure Code(s):        --- Professional ---                           [phone], Esophagogastroduodenoscopy, flexible,                            transoral; with removal of tumor(s), polyp(s), or                            other lesion(s) by snare technique Diagnosis Code(s):        --- Professional ---                           K20.90, Esophagitis, unspecified without bleeding                           K22.2, Esophageal obstruction                           K31.7, Polyp of stomach and duodenum                           R13.10, Dysphagia, unspecified                           K22.10, Ulcer of esophagus without bleeding CPT copyright 2019 [employer]. All rights reserved. The codes documented in this report are preliminary and upon coder review may  be revised to meet current compliance requirements. Otis Brace, MD Otis Brace, MD 05/31/2021 12:14:19 PM Number of Addenda: 0

## 2021-05-31 NOTE — Discharge Instructions (Signed)
YOU HAD AN ENDOSCOPIC PROCEDURE TODAY: Refer to the procedure report and other information in the discharge instructions given to you for any specific questions about what was found during the examination. If this information does not answer your questions, please call [facility] office at [phone] to clarify.   YOU SHOULD EXPECT: Some feelings of bloating in the abdomen. Passage of more gas than usual. Walking can help get rid of the air that was put into your GI tract during the procedure and reduce the bloating. If you had a lower endoscopy (such as a colonoscopy or flexible sigmoidoscopy) you may notice spotting of blood in your stool or on the toilet paper. Some abdominal soreness may be present for a day or two, also.  DIET: Your first meal following the procedure should be a light meal and then it is ok to progress to your normal diet. A half-sandwich or bowl of soup is an example of a good first meal. Heavy or fried foods are harder to digest and may make you feel nauseous or bloated. Drink plenty of fluids but you should avoid alcoholic beverages for 24 hours. If you had a esophageal dilation, please see attached instructions for diet.    ACTIVITY: Your care partner should take you home directly after the procedure. You should plan to take it easy, moving slowly for the rest of the day. You can resume normal activity the day after the procedure however YOU SHOULD NOT DRIVE, use power tools, machinery or perform tasks that involve climbing or major physical exertion for 24 hours (because of the sedation medicines used during the test).   SYMPTOMS TO REPORT IMMEDIATELY: A gastroenterologist can be reached at any hour. Please call [phone]  for any of the following symptoms:  Following lower endoscopy (colonoscopy, flexible sigmoidoscopy) Excessive amounts of blood in the stool  Significant tenderness, worsening of abdominal pains  Swelling of the abdomen that is new, acute  Fever of 100  or higher  Following upper endoscopy (EGD, EUS, ERCP, esophageal dilation) Vomiting of blood or coffee ground material  New, significant abdominal pain  New, significant chest pain or pain under the shoulder blades  Painful or persistently difficult swallowing  New shortness of breath  Black, tarry-looking or red, bloody stools  FOLLOW UP:  If any biopsies were taken you will be contacted by phone or by letter within the next 1-3 weeks. Call [phone]  if you have not heard about the biopsies in 3 weeks.  Please also call with any specific questions about appointments or follow up tests. YOU HAD AN ENDOSCOPIC PROCEDURE TODAY: Refer to the procedure report and other information in the discharge instructions given to you for any specific questions about what was found during the examination. If this information does not answer your questions, please call [facility] office at [phone] to clarify.   YOU SHOULD EXPECT: Some feelings of bloating in the abdomen. Passage of more gas than usual. Walking can help get rid of the air that was put into your GI tract during the procedure and reduce the bloating.   DIET:  Heavy or fried foods are harder to digest and may make you feel nauseous or bloated. Drink plenty of fluids but you should avoid alcoholic beverages for 24 hours. If you had a esophageal dilation, you may have soft diet today as tolerated and may progress to regular diet tomorrow.   ACTIVITY: Your care partner should take you home directly after the procedure. You should plan  to take it easy, moving slowly for the rest of the day. You can resume normal activity the day after the procedure however YOU SHOULD NOT DRIVE, use power tools, machinery or perform tasks that involve climbing or major physical exertion for 24 hours (because of the sedation medicines used during the test).   SYMPTOMS TO REPORT IMMEDIATELY: A gastroenterologist can be reached at any hour. Please call [phone]   for any of the following symptoms:  Following upper endoscopy (EGD, EUS, ERCP, esophageal dilation) Vomiting of blood or coffee ground material  New, significant abdominal pain  New, significant chest pain or pain under the shoulder blades  Painful or persistently difficult swallowing  New shortness of breath  Black, tarry-looking or red, bloody stools  FOLLOW UP:  If any biopsies were taken you will be contacted by phone or by letter within the next 1-3 weeks. Call [phone]  if you have not heard about the biopsies in 3 weeks.  Please also call with any specific questions about appointments or follow up tests.

## 2021-05-31 NOTE — Transfer of Care (Signed)
Immediate Anesthesia Transfer of Care Note  Patient: Krista Mason  Procedure(s) Performed: ESOPHAGOGASTRODUODENOSCOPY (EGD) WITH PROPOFOL POLYPECTOMY  Patient Location: PACU  Anesthesia Type:MAC  Level of Consciousness: awake, alert  and oriented  Airway & Oxygen Therapy: Patient Spontanous Breathing and Patient connected to face mask oxygen  Post-op Assessment: Report given to RN, Post -op Vital signs reviewed and stable and Patient moving all extremities X 4  Post vital signs: Reviewed and stable  Last Vitals:  Vitals Value Taken Time  BP 81/65   Temp    Pulse 70 05/31/21 1213  Resp 16 05/31/21 1213  SpO2 100 % 05/31/21 1213  Vitals shown include unvalidated device data.  Last Pain:  Vitals:   05/31/21 1040  TempSrc: Axillary  PainSc: 0-No pain         Complications: No notable events documented.

## 2021-06-01 ENCOUNTER — Encounter (HOSPITAL_COMMUNITY): Payer: Self-pay | Admitting: Gastroenterology

## 2021-06-01 LAB — SURGICAL PATHOLOGY

## 2021-06-02 DIAGNOSIS — N1832 Chronic kidney disease, stage 3b: Secondary | ICD-10-CM | POA: Diagnosis not present

## 2021-06-02 DIAGNOSIS — E1122 Type 2 diabetes mellitus with diabetic chronic kidney disease: Secondary | ICD-10-CM | POA: Diagnosis not present

## 2021-06-02 DIAGNOSIS — I130 Hypertensive heart and chronic kidney disease with heart failure and stage 1 through stage 4 chronic kidney disease, or unspecified chronic kidney disease: Secondary | ICD-10-CM | POA: Diagnosis not present

## 2021-06-02 DIAGNOSIS — E785 Hyperlipidemia, unspecified: Secondary | ICD-10-CM | POA: Diagnosis not present

## 2021-06-02 DIAGNOSIS — K219 Gastro-esophageal reflux disease without esophagitis: Secondary | ICD-10-CM | POA: Diagnosis not present

## 2021-06-02 DIAGNOSIS — D631 Anemia in chronic kidney disease: Secondary | ICD-10-CM | POA: Diagnosis not present

## 2021-06-02 DIAGNOSIS — I5022 Chronic systolic (congestive) heart failure: Secondary | ICD-10-CM | POA: Diagnosis not present

## 2021-06-02 DIAGNOSIS — I480 Paroxysmal atrial fibrillation: Secondary | ICD-10-CM | POA: Diagnosis not present

## 2021-06-02 DIAGNOSIS — J449 Chronic obstructive pulmonary disease, unspecified: Secondary | ICD-10-CM | POA: Diagnosis not present

## 2021-06-02 DIAGNOSIS — E1151 Type 2 diabetes mellitus with diabetic peripheral angiopathy without gangrene: Secondary | ICD-10-CM | POA: Diagnosis not present

## 2021-06-05 DIAGNOSIS — I480 Paroxysmal atrial fibrillation: Secondary | ICD-10-CM | POA: Diagnosis not present

## 2021-06-05 DIAGNOSIS — J449 Chronic obstructive pulmonary disease, unspecified: Secondary | ICD-10-CM | POA: Diagnosis not present

## 2021-06-05 DIAGNOSIS — E1122 Type 2 diabetes mellitus with diabetic chronic kidney disease: Secondary | ICD-10-CM | POA: Diagnosis not present

## 2021-06-05 DIAGNOSIS — K219 Gastro-esophageal reflux disease without esophagitis: Secondary | ICD-10-CM | POA: Diagnosis not present

## 2021-06-05 DIAGNOSIS — N1832 Chronic kidney disease, stage 3b: Secondary | ICD-10-CM | POA: Diagnosis not present

## 2021-06-05 DIAGNOSIS — D631 Anemia in chronic kidney disease: Secondary | ICD-10-CM | POA: Diagnosis not present

## 2021-06-05 DIAGNOSIS — E785 Hyperlipidemia, unspecified: Secondary | ICD-10-CM | POA: Diagnosis not present

## 2021-06-05 DIAGNOSIS — I130 Hypertensive heart and chronic kidney disease with heart failure and stage 1 through stage 4 chronic kidney disease, or unspecified chronic kidney disease: Secondary | ICD-10-CM | POA: Diagnosis not present

## 2021-06-05 DIAGNOSIS — I5022 Chronic systolic (congestive) heart failure: Secondary | ICD-10-CM | POA: Diagnosis not present

## 2021-06-05 DIAGNOSIS — E1151 Type 2 diabetes mellitus with diabetic peripheral angiopathy without gangrene: Secondary | ICD-10-CM | POA: Diagnosis not present

## 2021-06-09 DIAGNOSIS — I480 Paroxysmal atrial fibrillation: Secondary | ICD-10-CM | POA: Diagnosis not present

## 2021-06-09 DIAGNOSIS — I5022 Chronic systolic (congestive) heart failure: Secondary | ICD-10-CM | POA: Diagnosis not present

## 2021-06-09 DIAGNOSIS — E1122 Type 2 diabetes mellitus with diabetic chronic kidney disease: Secondary | ICD-10-CM | POA: Diagnosis not present

## 2021-06-09 DIAGNOSIS — N1832 Chronic kidney disease, stage 3b: Secondary | ICD-10-CM | POA: Diagnosis not present

## 2021-06-09 DIAGNOSIS — J449 Chronic obstructive pulmonary disease, unspecified: Secondary | ICD-10-CM | POA: Diagnosis not present

## 2021-06-09 DIAGNOSIS — E785 Hyperlipidemia, unspecified: Secondary | ICD-10-CM | POA: Diagnosis not present

## 2021-06-09 DIAGNOSIS — I130 Hypertensive heart and chronic kidney disease with heart failure and stage 1 through stage 4 chronic kidney disease, or unspecified chronic kidney disease: Secondary | ICD-10-CM | POA: Diagnosis not present

## 2021-06-09 DIAGNOSIS — D631 Anemia in chronic kidney disease: Secondary | ICD-10-CM | POA: Diagnosis not present

## 2021-06-09 DIAGNOSIS — K219 Gastro-esophageal reflux disease without esophagitis: Secondary | ICD-10-CM | POA: Diagnosis not present

## 2021-06-09 DIAGNOSIS — E1151 Type 2 diabetes mellitus with diabetic peripheral angiopathy without gangrene: Secondary | ICD-10-CM | POA: Diagnosis not present

## 2021-06-12 ENCOUNTER — Other Ambulatory Visit (HOSPITAL_COMMUNITY): Payer: Self-pay | Admitting: *Deleted

## 2021-06-13 ENCOUNTER — Other Ambulatory Visit: Payer: Self-pay

## 2021-06-13 ENCOUNTER — Encounter (HOSPITAL_COMMUNITY)
Admission: RE | Admit: 2021-06-13 | Discharge: 2021-06-13 | Disposition: A | Discharge status: Home / Self Care | Payer: Medicare HMO | Source: Ambulatory Visit | Attending: Nephrology | Admitting: Nephrology

## 2021-06-13 VITALS — BP 127/58 | HR 69 | Temp 97.5°F | Resp 20

## 2021-06-13 DIAGNOSIS — D631 Anemia in chronic kidney disease: Secondary | ICD-10-CM | POA: Insufficient documentation

## 2021-06-13 DIAGNOSIS — N183 Chronic kidney disease, stage 3 unspecified: Secondary | ICD-10-CM | POA: Diagnosis present

## 2021-06-13 LAB — IRON AND TIBC
Iron: 26 ug/dL — ABNORMAL LOW (ref 28–170)
Saturation Ratios: 6 % — ABNORMAL LOW (ref 10.4–31.8)
TIBC: 423 ug/dL (ref 250–450)
UIBC: 397 ug/dL

## 2021-06-13 LAB — POCT HEMOGLOBIN-HEMACUE: Hemoglobin: 9 g/dL — ABNORMAL LOW (ref 12.0–15.0)

## 2021-06-13 LAB — FERRITIN: Ferritin: 56 ng/mL (ref 11–307)

## 2021-06-13 MED ORDER — DARBEPOETIN ALFA 100 MCG/0.5ML IJ SOSY
PREFILLED_SYRINGE | INTRAMUSCULAR | Status: AC
  Administered 2021-06-13: 100 ug via SUBCUTANEOUS
  Filled 2021-06-13: qty 0.5

## 2021-06-13 MED ORDER — DARBEPOETIN ALFA 100 MCG/0.5ML IJ SOSY: 100.0000 ug | PREFILLED_SYRINGE | INTRAMUSCULAR | Status: DC

## 2021-06-14 ENCOUNTER — Encounter (HOSPITAL_COMMUNITY): Payer: Medicare HMO

## 2021-06-14 DIAGNOSIS — K219 Gastro-esophageal reflux disease without esophagitis: Secondary | ICD-10-CM | POA: Diagnosis not present

## 2021-06-14 DIAGNOSIS — J449 Chronic obstructive pulmonary disease, unspecified: Secondary | ICD-10-CM | POA: Diagnosis not present

## 2021-06-14 DIAGNOSIS — E1151 Type 2 diabetes mellitus with diabetic peripheral angiopathy without gangrene: Secondary | ICD-10-CM | POA: Diagnosis not present

## 2021-06-14 DIAGNOSIS — N184 Chronic kidney disease, stage 4 (severe): Secondary | ICD-10-CM | POA: Diagnosis not present

## 2021-06-14 DIAGNOSIS — I129 Hypertensive chronic kidney disease with stage 1 through stage 4 chronic kidney disease, or unspecified chronic kidney disease: Secondary | ICD-10-CM | POA: Diagnosis not present

## 2021-06-14 DIAGNOSIS — D509 Iron deficiency anemia, unspecified: Secondary | ICD-10-CM | POA: Diagnosis not present

## 2021-06-14 DIAGNOSIS — I480 Paroxysmal atrial fibrillation: Secondary | ICD-10-CM | POA: Diagnosis not present

## 2021-06-14 DIAGNOSIS — N1832 Chronic kidney disease, stage 3b: Secondary | ICD-10-CM | POA: Diagnosis not present

## 2021-06-14 DIAGNOSIS — E785 Hyperlipidemia, unspecified: Secondary | ICD-10-CM | POA: Diagnosis not present

## 2021-06-14 DIAGNOSIS — D631 Anemia in chronic kidney disease: Secondary | ICD-10-CM | POA: Diagnosis not present

## 2021-06-14 DIAGNOSIS — I130 Hypertensive heart and chronic kidney disease with heart failure and stage 1 through stage 4 chronic kidney disease, or unspecified chronic kidney disease: Secondary | ICD-10-CM | POA: Diagnosis not present

## 2021-06-14 DIAGNOSIS — I5022 Chronic systolic (congestive) heart failure: Secondary | ICD-10-CM | POA: Diagnosis not present

## 2021-06-14 DIAGNOSIS — I4891 Unspecified atrial fibrillation: Secondary | ICD-10-CM | POA: Diagnosis not present

## 2021-06-14 DIAGNOSIS — K2210 Ulcer of esophagus without bleeding: Secondary | ICD-10-CM | POA: Diagnosis not present

## 2021-06-14 DIAGNOSIS — E1122 Type 2 diabetes mellitus with diabetic chronic kidney disease: Secondary | ICD-10-CM | POA: Diagnosis not present

## 2021-06-15 DIAGNOSIS — D631 Anemia in chronic kidney disease: Secondary | ICD-10-CM | POA: Diagnosis not present

## 2021-06-15 DIAGNOSIS — E1122 Type 2 diabetes mellitus with diabetic chronic kidney disease: Secondary | ICD-10-CM | POA: Diagnosis not present

## 2021-06-15 DIAGNOSIS — I5022 Chronic systolic (congestive) heart failure: Secondary | ICD-10-CM | POA: Diagnosis not present

## 2021-06-15 DIAGNOSIS — E785 Hyperlipidemia, unspecified: Secondary | ICD-10-CM | POA: Diagnosis not present

## 2021-06-15 DIAGNOSIS — E1151 Type 2 diabetes mellitus with diabetic peripheral angiopathy without gangrene: Secondary | ICD-10-CM | POA: Diagnosis not present

## 2021-06-15 DIAGNOSIS — I480 Paroxysmal atrial fibrillation: Secondary | ICD-10-CM | POA: Diagnosis not present

## 2021-06-15 DIAGNOSIS — I130 Hypertensive heart and chronic kidney disease with heart failure and stage 1 through stage 4 chronic kidney disease, or unspecified chronic kidney disease: Secondary | ICD-10-CM | POA: Diagnosis not present

## 2021-06-15 DIAGNOSIS — K219 Gastro-esophageal reflux disease without esophagitis: Secondary | ICD-10-CM | POA: Diagnosis not present

## 2021-06-15 DIAGNOSIS — N1832 Chronic kidney disease, stage 3b: Secondary | ICD-10-CM | POA: Diagnosis not present

## 2021-06-15 DIAGNOSIS — J449 Chronic obstructive pulmonary disease, unspecified: Secondary | ICD-10-CM | POA: Diagnosis not present

## 2021-06-16 DIAGNOSIS — I480 Paroxysmal atrial fibrillation: Secondary | ICD-10-CM | POA: Diagnosis not present

## 2021-06-16 DIAGNOSIS — I130 Hypertensive heart and chronic kidney disease with heart failure and stage 1 through stage 4 chronic kidney disease, or unspecified chronic kidney disease: Secondary | ICD-10-CM | POA: Diagnosis not present

## 2021-06-16 DIAGNOSIS — E785 Hyperlipidemia, unspecified: Secondary | ICD-10-CM | POA: Diagnosis not present

## 2021-06-16 DIAGNOSIS — D631 Anemia in chronic kidney disease: Secondary | ICD-10-CM | POA: Diagnosis not present

## 2021-06-16 DIAGNOSIS — N1832 Chronic kidney disease, stage 3b: Secondary | ICD-10-CM | POA: Diagnosis not present

## 2021-06-16 DIAGNOSIS — I5022 Chronic systolic (congestive) heart failure: Secondary | ICD-10-CM | POA: Diagnosis not present

## 2021-06-16 DIAGNOSIS — J449 Chronic obstructive pulmonary disease, unspecified: Secondary | ICD-10-CM | POA: Diagnosis not present

## 2021-06-16 DIAGNOSIS — K219 Gastro-esophageal reflux disease without esophagitis: Secondary | ICD-10-CM | POA: Diagnosis not present

## 2021-06-16 DIAGNOSIS — E1151 Type 2 diabetes mellitus with diabetic peripheral angiopathy without gangrene: Secondary | ICD-10-CM | POA: Diagnosis not present

## 2021-06-16 DIAGNOSIS — E1122 Type 2 diabetes mellitus with diabetic chronic kidney disease: Secondary | ICD-10-CM | POA: Diagnosis not present

## 2021-06-19 ENCOUNTER — Other Ambulatory Visit (HOSPITAL_COMMUNITY): Payer: Self-pay | Admitting: *Deleted

## 2021-06-20 ENCOUNTER — Encounter (HOSPITAL_COMMUNITY)
Admission: RE | Admit: 2021-06-20 | Discharge: 2021-06-20 | Disposition: A | Discharge status: Home / Self Care | Payer: Medicare HMO | Source: Ambulatory Visit | Attending: Nephrology | Admitting: Nephrology

## 2021-06-20 ENCOUNTER — Other Ambulatory Visit: Payer: Self-pay

## 2021-06-20 DIAGNOSIS — D631 Anemia in chronic kidney disease: Secondary | ICD-10-CM | POA: Diagnosis not present

## 2021-06-20 DIAGNOSIS — N183 Chronic kidney disease, stage 3 unspecified: Secondary | ICD-10-CM | POA: Diagnosis not present

## 2021-06-20 MED ORDER — SODIUM CHLORIDE 0.9 % IV SOLN
510.0000 mg | INTRAVENOUS | Status: DC
  Administered 2021-06-20: 510 mg via INTRAVENOUS
  Filled 2021-06-20: qty 510

## 2021-06-21 ENCOUNTER — Telehealth: Payer: Self-pay

## 2021-06-21 NOTE — Telephone Encounter (Signed)
Patient called in stating she has been lightheaded for the past few days. Patient has a burning sensation in her chest and her heart is beating hard

## 2021-06-21 NOTE — Telephone Encounter (Signed)
Spoke to patient and reports of chest pain/lightheadedness worsened with position change and "pounding in my chest" for the past few days. Remote transmission received and normal device functions. No alerts triggered.  Advised patient recommended her go to the ED for evaluation considering her symptoms. Advised NOT to drive, call [phone] or have someone drive her. Verbalized understanding.

## 2021-06-22 ENCOUNTER — Telehealth: Payer: Self-pay | Admitting: Cardiology

## 2021-06-22 DIAGNOSIS — M1990 Unspecified osteoarthritis, unspecified site: Secondary | ICD-10-CM | POA: Diagnosis not present

## 2021-06-22 DIAGNOSIS — I130 Hypertensive heart and chronic kidney disease with heart failure and stage 1 through stage 4 chronic kidney disease, or unspecified chronic kidney disease: Secondary | ICD-10-CM | POA: Diagnosis not present

## 2021-06-22 DIAGNOSIS — E1151 Type 2 diabetes mellitus with diabetic peripheral angiopathy without gangrene: Secondary | ICD-10-CM | POA: Diagnosis not present

## 2021-06-22 DIAGNOSIS — Z87891 Personal history of nicotine dependence: Secondary | ICD-10-CM | POA: Diagnosis not present

## 2021-06-22 DIAGNOSIS — E785 Hyperlipidemia, unspecified: Secondary | ICD-10-CM | POA: Diagnosis not present

## 2021-06-22 DIAGNOSIS — D631 Anemia in chronic kidney disease: Secondary | ICD-10-CM | POA: Diagnosis not present

## 2021-06-22 DIAGNOSIS — Z8701 Personal history of pneumonia (recurrent): Secondary | ICD-10-CM | POA: Diagnosis not present

## 2021-06-22 DIAGNOSIS — M25512 Pain in left shoulder: Secondary | ICD-10-CM | POA: Diagnosis not present

## 2021-06-22 DIAGNOSIS — K219 Gastro-esophageal reflux disease without esophagitis: Secondary | ICD-10-CM | POA: Diagnosis not present

## 2021-06-22 DIAGNOSIS — N1832 Chronic kidney disease, stage 3b: Secondary | ICD-10-CM | POA: Diagnosis not present

## 2021-06-22 DIAGNOSIS — J449 Chronic obstructive pulmonary disease, unspecified: Secondary | ICD-10-CM | POA: Diagnosis not present

## 2021-06-22 DIAGNOSIS — Z9181 History of falling: Secondary | ICD-10-CM | POA: Diagnosis not present

## 2021-06-22 DIAGNOSIS — I480 Paroxysmal atrial fibrillation: Secondary | ICD-10-CM | POA: Diagnosis not present

## 2021-06-22 DIAGNOSIS — Z7901 Long term (current) use of anticoagulants: Secondary | ICD-10-CM | POA: Diagnosis not present

## 2021-06-22 DIAGNOSIS — I5022 Chronic systolic (congestive) heart failure: Secondary | ICD-10-CM | POA: Diagnosis not present

## 2021-06-22 DIAGNOSIS — E1122 Type 2 diabetes mellitus with diabetic chronic kidney disease: Secondary | ICD-10-CM | POA: Diagnosis not present

## 2021-06-22 NOTE — Telephone Encounter (Signed)
STAT if patient feels like he/she is going to faint   Are you dizzy now? No  Do you feel faint or have you passed out? NO  Do you have any other symptoms? No other symtoms No fever No Covid like symptoms  Skin Is warm and dry No other symptoms  Have you checked your HR and BP (record if available)?    BP Orthostatics 140/70 sitting   100/60 standing   Patient had Covid in January

## 2021-06-22 NOTE — Telephone Encounter (Signed)
Called home health nurse back Krista Mason. Patient has been having dizziness and chest pain off and on for the last couple of days. Home health nurse also check orthostatic hypotension which patient was positive. Nurse stated she knows patient very well, and she can tell something is going on with the patient. Encouraged her to tell patient to go to ED if symptoms get worse. Made patient an appointment to see DOD tomorrow. Patient is refusing to go to ED, but will come into the office tomorrow.

## 2021-06-23 ENCOUNTER — Encounter: Payer: Self-pay | Admitting: Cardiovascular Disease

## 2021-06-23 ENCOUNTER — Ambulatory Visit: Payer: Medicare HMO | Admitting: Cardiovascular Disease

## 2021-06-23 ENCOUNTER — Other Ambulatory Visit: Payer: Self-pay

## 2021-06-23 VITALS — BP 130/64 | HR 83 | Ht 65.0 in | Wt 143.6 lb

## 2021-06-23 DIAGNOSIS — I5022 Chronic systolic (congestive) heart failure: Secondary | ICD-10-CM

## 2021-06-23 NOTE — Progress Notes (Signed)
Cardiology Office Note   Date:  06/23/2021   ID:  Krista, Mason 11-06-1935, MRN [id number]  PCP:  [facility] [place]  Cardiologist:   Mertie Moores, MD   Chief Complaint  Patient presents with   Congestive Heart Failure   Tachycardia         P roblem List 1. Hypertension 2. Hyperlipidemia 3. Diabetes mellitus 4. Chronic kidney disease 5. Multifocal atrial tachycardia 6. Chronic systolic congestive heart failure 7. Frequent premature ventricular contractions    Previous notes;  Krista Mason is a 85 y.o. female     retired Therapist, sports with a hx of HTN, PVCs, HL, DM2, CKD, histoplasmosis, tobacco abuse.  She was evaluated by Dr. Trula Slade in 2014 for PAD.  ABIs: R 0.94 and L 0.73.  Medical Rx was recommended at that time.  I last saw me  in 2010.   she was recent seen by Nicki Reaper we are for episodes of tachycardia. Her EKG showed multifocal atrial tachycardia. This was probably related to her COPD.  The MAT has improved.  She is still taking the Diltiazem .   No CP or dyspnea.  Has had some left  knee surgery   Aug. 28, 2017,  Pt is seen for her 1 year ov. Saw Dr. Drema Dallas last week.  Was found to have tachycardia - likely ws MAT .   No CP or dyspnea Has lost some weight .  20 labs in the past year  Is scheduled to see Dr. Cristina Gong.   Jan. 23, 2018:  Krista Mason is seen today for follow up visit - with [place] and son-in law Annie Main.  She was hospitalized for increasing shortness of breath on 11/06/2016. Echocardiogram on January 3 shows moderate left ventricular dysfunction with EF of 35-40%. She has grade 2 diastolic dysfunction.  Cardiac cath was considered but was not done. She has chronic kidney disease. Her serum creatinine at baseline is 1.6.  Has quit smoking 9 days ago  O2 Sats were low when she came in - 74%. Up to 98% with O2 2 LPM.    Has occasional CP , burning , heaviness in her chest .  Also has palpitations (" flip flopping ) - has  frequent PVCs No ischemia by myoview testing on Jan. 3. 2018   Echo generator third reveals moderately depressed left ventricle systolic function with an EF of 35-40%. She has grade 2 diastolic dysfunction. She had numerous premature ventricular contractions.   Sept. 2, 2022:  Has developed CHF, atrial fib, pacer, GI bleed, CKD, malnutrition, vertigo   I last saw her 4+ years ago  Had a rough COVID hospitalization in May, Has had lightheadedness, dizziness Shakiness when standing  Seeing B[facility] for her CHF has COPD  Has been on a reducing dose of amiodarone  Has had a PVC ablation   Stage IV CKD  Creatinine was 2.32 on April 14, 2021  Echo in March 2021 shows mildly reduced LV function with EF 45-50%.   Oxygen levels have been good .  - no longer on home O2   Gets iron infusion - Hb is up to 9.0 currently ,  Hb was 7.4 on June, 29.  Still having some ongoing GI bleeding  Having dark tarry stools. Has an inflammation and edema and a stricture in her esophagus (?)  Appetite is fair.  Has never been a bid eater.  Abumin is 2.8  Tries to Eat soft foods  Uses a Rolator  when she walks  Is in a wheelchair today  Uses a wheelchair in the mornings.  Works with PT on occasion .   She was told that PT had some about as much as he could do for her   We discussed her need for some strength training .  I suspect this is failure to thrive We discussed having her increase her Ensure to 2 a day   Past Medical History:  Diagnosis Date   Anemia    Atrial fibrillation (HCC)    Chronic combined systolic and diastolic CHF (congestive heart failure) ([place])    a. LV dysfcuntion felt to be related to PVCs   Chronic kidney disease    Diabetes mellitus without complication (HCC)    GERD (gastroesophageal reflux disease)    Hx of echocardiogram    Echo (1/16):  EF 60-65%, no RWMA, Gr 1 DD, mod AI, mod MR, mild LAE   Hyperkalemia    Hyperlipidemia    Hypertension    NICM (nonischemic  cardiomyopathy) ([place])    a. 12/2018 Echo: EF 35%, diff HK w/ septal-lateral dyssynchrony. Nl RV fxn; b. 07/2019 s/p MDT [id number] [name] CRT-P MRI SureScan device (ser #: [id number])   Orthostatic hypotension    PVC (premature ventricular contraction)    a. s/p PVC ablation in 11/2016   Rheumatic fever    Wears dentures     Past Surgical History:  Procedure Laterality Date   BALLOON DILATION N/A 07/12/2020   Procedure: BALLOON DILATION;  Surgeon: Ronald Lobo, MD;  Location: [facility];  Service: Endoscopy;  Laterality: N/A;   BIOPSY  08/20/2019   Procedure: BIOPSY;  Surgeon: Otis Brace, MD;  Location: [facility];  Service: Gastroenterology;;   BIV PACEMAKER INSERTION CRT-P N/A 07/24/2019   Procedure: BIV PACEMAKER INSERTION CRT-P;  Surgeon: Constance Haw, MD;  Location: [facility];  Service: Cardiovascular;  Laterality: N/A;   COLONOSCOPY WITH PROPOFOL N/A 08/22/2019   Procedure: COLONOSCOPY WITH PROPOFOL;  Surgeon: Laurence Spates, MD;  Location: [facility];  Service: Endoscopy;  Laterality: N/A;   ESOPHAGOGASTRODUODENOSCOPY N/A 02/28/2021   Procedure: ESOPHAGOGASTRODUODENOSCOPY (EGD);  Surgeon: Wilford Corner, MD;  Location: [facility];  Service: Endoscopy;  Laterality: N/A;   ESOPHAGOGASTRODUODENOSCOPY (EGD) WITH PROPOFOL N/A 09/25/2018   Procedure: ESOPHAGOGASTRODUODENOSCOPY (EGD) WITH PROPOFOL;  Surgeon: Ronald Lobo, MD;  [facility];  Service: Endoscopy;  Laterality: N/A;   ESOPHAGOGASTRODUODENOSCOPY (EGD) WITH PROPOFOL N/A 10/29/2018   Procedure: ESOPHAGOGASTRODUODENOSCOPY (EGD) WITH PROPOFOL;  Surgeon: Ronald Lobo, MD;  [facility];  Service: Endoscopy;  Laterality: N/A;   ESOPHAGOGASTRODUODENOSCOPY (EGD) WITH PROPOFOL N/A 08/20/2019   Procedure: ESOPHAGOGASTRODUODENOSCOPY (EGD) WITH PROPOFOL;  Surgeon: Otis Brace, MD;  Location: [facility];  Service: Gastroenterology;  Laterality: N/A;    ESOPHAGOGASTRODUODENOSCOPY (EGD) WITH PROPOFOL N/A 05/03/2020   Procedure: ESOPHAGOGASTRODUODENOSCOPY (EGD) WITH PROPOFOL;  Surgeon: Ronald Lobo, MD;  [facility];  Service: Endoscopy;  Laterality: N/A;   ESOPHAGOGASTRODUODENOSCOPY (EGD) WITH PROPOFOL N/A 06/02/2020   Procedure: ESOPHAGOGASTRODUODENOSCOPY (EGD) WITH PROPOFOL and Savory Dilatation;  Surgeon: Clarene Essex, MD;  [facility];  Service: Gastroenterology;  Laterality: N/A;   ESOPHAGOGASTRODUODENOSCOPY (EGD) WITH PROPOFOL N/A 07/12/2020   Procedure: ESOPHAGOGASTRODUODENOSCOPY (EGD) WITH PROPOFOL with dilatation;  Surgeon: Ronald Lobo, MD;  [facility];  Service: Endoscopy;  Laterality: N/A;   ESOPHAGOGASTRODUODENOSCOPY (EGD) WITH PROPOFOL N/A 05/31/2021   Procedure: ESOPHAGOGASTRODUODENOSCOPY (EGD) WITH PROPOFOL;  Surgeon: Otis Brace, MD;  [facility];  Service: Gastroenterology;  Laterality: N/A;   HEMOSTASIS CLIP  PLACEMENT  08/22/2019   Procedure: HEMOSTASIS CLIP PLACEMENT;  Surgeon: Laurence Spates, MD;  [facility];  Service: Endoscopy;;   KNEE SURGERY Left 2009   OVARIAN CYST REMOVAL     POLYPECTOMY  08/22/2019   Procedure: POLYPECTOMY;  Surgeon: Laurence Spates, MD;  Location: [place];  Service: Endoscopy;;   POLYPECTOMY  05/31/2021   Procedure: POLYPECTOMY;  Surgeon: Otis Brace, MD;  [facility];  Service: Gastroenterology;;   PVC ABLATION N/A 11/30/2016   Procedure: PVC Ablation;  Surgeon: Will [name], MD;  Location: [place] [facility];  Service: Cardiovascular;  Laterality: N/A;   RIGHT HEART CATH N/A 01/31/2018   Procedure: RIGHT HEART CATH;  Surgeon: Jolaine Artist, MD;  Location: [place] [facility];  Service: Cardiovascular;  Laterality: N/A;   RIGHT/LEFT HEART CATH AND CORONARY ANGIOGRAPHY N/A 01/25/2017   Procedure: Right/Left Heart Cath and Coronary Angiography;  Surgeon: Jolaine Artist, MD;  Location: [facility] [facility];   Service: Cardiovascular;  Laterality: N/A;   SAVORY DILATION N/A 09/25/2018   Procedure: SAVORY DILATION;  Surgeon: Ronald Lobo, MD;  [facility];  Service: Endoscopy;  Laterality: N/A;   SAVORY DILATION N/A 10/29/2018   Procedure: SAVORY DILATION;  Surgeon: Ronald Lobo, MD;  [facility];  Service: Endoscopy;  Laterality: N/A;   SAVORY DILATION N/A 08/20/2019   Procedure: SAVORY DILATION;  Surgeon: Otis Brace, MD;  Location: [facility];  Service: Gastroenterology;  Laterality: N/A;   SAVORY DILATION N/A 05/03/2020   Procedure: SAVORY DILATION;  Surgeon: Ronald Lobo, MD;  [facility];  Service: Endoscopy;  Laterality: N/A;  Patient needs ultraslim pediatric upper endoscope/     no fluoro per dr. Cristal Deer DILATION N/A 06/02/2020   Procedure: SAVORY DILATION w/ Burtis Junes;  Surgeon: Clarene Essex, MD;  [facility];  Service: Gastroenterology;  Laterality: N/A;     Current Outpatient Medications  Medication Sig Dispense Refill   acetaminophen (TYLENOL) 500 MG tablet Take 1,000 mg by mouth every 6 (six) hours as needed for moderate pain, headache or mild pain.     amiodarone (PACERONE) 100 MG tablet TAKE ONE TABLET BY MOUTH EVERY OTHER DAY 45 tablet 1   atorvastatin (LIPITOR) 20 MG tablet Take 1 tablet (20 mg total) by mouth at bedtime. 90 tablet 3   carvedilol (COREG) 12.5 MG tablet Take 1 tablet (12.5 mg total) by mouth 2 (two) times daily with a meal. 60 tablet 11   cholecalciferol (VITAMIN D3) 25 MCG (1000 UNIT) tablet Take 2,000 Units by mouth every morning.     Cyanocobalamin (VITAMIN B-12) 2500 MCG SUBL Take 2,500 mcg by mouth every other day.     diphenhydrAMINE (BENADRYL) 25 mg capsule Take 25 mg by mouth every 6 (six) hours as needed (allergies/sleep).     ELIQUIS 2.5 MG TABS tablet TAKE 1 TABLET BY MOUTH TWICE A DAY 60 tablet 6   Ensure (ENSURE) Take 237 mLs by mouth daily.     hydrALAZINE (APRESOLINE) 50 MG tablet Take 50  mg by mouth 2 (two) times daily with a meal.     ondansetron (ZOFRAN) 4 MG tablet Take 4 mg by mouth every 6 (six) hours as needed for nausea or vomiting.     pantoprazole (PROTONIX) 40 MG tablet Take 40 mg by mouth in the morning and at bedtime.     Polyethyl Glycol-Propyl Glycol 0.4-0.3 % SOLN Place 1 drop into both eyes every 8 (eight) hours as needed (dryness).  polyethylene glycol (MIRALAX / GLYCOLAX) 17 g packet Take 17 g by mouth daily as needed (constipation.).     torsemide (DEMADEX) 20 MG tablet Take 20-40 mg by mouth See admin instructions. Take 1 tablet (20 mg) by mouth scheduled every morning & may take 2 tablets (40 mg) by mouth in the morning if experiencing fluid retention.     No current facility-administered medications for this visit.    Allergies:   Epoetin alfa, Penicillins, Retacrit [epoetin (alfa)], Crestor [rosuvastatin], Hydrocodone, Influenza vac split quad, Tape, Cefaclor, Crestor [rosuvastatin calcium], Influenza vaccines, Lidocaine hcl, Nsaids, Percocet [oxycodone-acetaminophen], and Prednisone    Social History:  The patient  reports that she quit smoking about 4 years ago. Her smoking use included cigarettes. She has a 45.00 pack-year smoking history. She has never used smokeless tobacco. She reports that she does not drink alcohol and does not use drugs.   Family History:  The patient's family history includes Cancer in her father; Diabetes in her son; Heart attack in her father; Heart disease in her father; Hyperlipidemia in her son; Hypertension in her brother, brother, father, and son; Stroke in her brother.    ROS:  Please see the history of present illness.      All other systems are reviewed and negative.    PHYSICAL EXAM: VS:  BP 130/64   Pulse 83   Ht 5\' 5"  (1.651 m)   Wt 143 lb 9.6 oz (65.1 kg)   SpO2 99%   BMI 23.90 kg/m  , BMI Body mass index is 23.9 kg/m. GEN: Well nourished, well developed, in no acute distress  HEENT: normal  Neck:  no JVD, carotid bruits, or masses Cardiac: Irreg. Irreg. ; soft systolic  murmur, rubs, or gallops,no edema  Respiratory:  clear to auscultation bilaterally, normal work of breathing GI: soft, nontender, nondistended, + BS MS: no deformity or atrophy  Skin: warm and dry, no rash Neuro:  Strength and sensation are intact Psych: normal   EKG: June 23, 2021: Wandering atrial pacemaker at a rate of 83.  Right bundle branch block.    Recent Labs: 12/12/2020: TSH 1.490 03/11/2021: Magnesium 1.9 04/14/2021: ALT 18; B Natriuretic Peptide 524.4; BUN 30; Creatinine, Ser 2.32; Platelets 316; Potassium 3.7; Sodium 131 06/13/2021: Hemoglobin 9.0    Lipid Panel No results found for: CHOL, TRIG, HDL, CHOLHDL, VLDL, LDLCALC, LDLDIRECT    Wt Readings from Last 3 Encounters:  06/23/21 143 lb 9.6 oz (65.1 kg)  05/31/21 143 lb (64.9 kg)  04/06/21 146 lb 9.6 oz (66.5 kg)      Other studies Reviewed: Additional studies/ records that were reviewed today include: . Review of the above records demonstrates:    ASSESSMENT AND PLAN:  1.  Chronic systolic CHF:   Has EF 37-62 % . Likely due to PVCs Has had a PVC ablation  2.  Chronic combined CHF:   EF 45%.   Seems to be stable   3.   Failure to thrive:   this seems to be multifactorial - she has had a rough several years and an especially rough year. Has progressive CKD - Cr is 2.3 as of a month ago  Has congong GI bleeding  - multiple EGDs.   Has friable esophageal mucosa that bleeds frequently  Has poor nutrition - albumin = 2.8  She had a [facility] hospitalization with COVID and a GI bleed  I think all of these issues are contributing to her fatigue, inability to stand / walk for  Burbridge distances or Deandrade duration   She will follow up with Dr. Cristina Gong and Dr. Kayren Eaves encouraged her to try to eat better - add another can of ensure daily  if possible  Try to continue to work with PT to build up her strength.      4. Probable COPD:     5.  CKD - progressive renal insufficiency   6.   Blood loss / iron deficiency anemia :  plans per Dr. Cristina Gong     Current medicines are reviewed at length with the patient today.  The patient does not have concerns regarding medicines.  The following changes have been made:  no change   Disposition:   FU with me with Dr. Haroldine Laws    Signed, Mertie Moores, MD  06/23/2021 4:27 PM    [address], [place], [place]  [address] Phone: [phone]; Fax: [phone]

## 2021-06-23 NOTE — Patient Instructions (Addendum)
Please ask Dr. Cristina Gong to draw a BMP when he gets your blood on Wednesday .   Medication Instructions:  Your physician recommends that you continue on your current medications as directed. Please refer to the Current Medication list given to you today.   *If you need a refill on your cardiac medications before your next appointment, please call your pharmacy*   Follow-Up: At [facility], you and your health needs are our priority.  As part of our continuing mission to provide you with exceptional heart care, we have created designated Provider Care Teams.  These Care Teams include your primary Cardiologist (physician) and Advanced Practice Providers (APPs -  Physician Assistants and Nurse Practitioners) who all work together to provide you with the care you need, when you need it.   The format for your next appointment:   In Person  Provider:   Dr. Jeffie Pollock

## 2021-06-27 ENCOUNTER — Encounter (HOSPITAL_COMMUNITY)
Admission: RE | Admit: 2021-06-27 | Discharge: 2021-06-27 | Disposition: A | Discharge status: Home / Self Care | Payer: Medicare HMO | Source: Ambulatory Visit | Attending: Nephrology | Admitting: Nephrology

## 2021-06-27 VITALS — BP 115/44 | HR 70 | Temp 97.9°F | Resp 18

## 2021-06-27 DIAGNOSIS — N183 Chronic kidney disease, stage 3 unspecified: Secondary | ICD-10-CM

## 2021-06-27 DIAGNOSIS — D631 Anemia in chronic kidney disease: Secondary | ICD-10-CM | POA: Insufficient documentation

## 2021-06-27 MED ORDER — SODIUM CHLORIDE 0.9 % IV SOLN
510.0000 mg | INTRAVENOUS | Status: DC
  Administered 2021-06-27: 510 mg via INTRAVENOUS
  Filled 2021-06-27: qty 510

## 2021-06-27 MED ORDER — DARBEPOETIN ALFA 100 MCG/0.5ML IJ SOSY: 100.0000 ug | PREFILLED_SYRINGE | INTRAMUSCULAR | Status: DC

## 2021-06-28 DIAGNOSIS — K222 Esophageal obstruction: Secondary | ICD-10-CM | POA: Diagnosis not present

## 2021-06-28 DIAGNOSIS — K209 Esophagitis, unspecified without bleeding: Secondary | ICD-10-CM | POA: Diagnosis not present

## 2021-06-28 DIAGNOSIS — N184 Chronic kidney disease, stage 4 (severe): Secondary | ICD-10-CM | POA: Diagnosis not present

## 2021-06-28 DIAGNOSIS — J449 Chronic obstructive pulmonary disease, unspecified: Secondary | ICD-10-CM | POA: Diagnosis not present

## 2021-06-28 DIAGNOSIS — I4891 Unspecified atrial fibrillation: Secondary | ICD-10-CM | POA: Diagnosis not present

## 2021-06-29 DIAGNOSIS — Z8701 Personal history of pneumonia (recurrent): Secondary | ICD-10-CM | POA: Diagnosis not present

## 2021-06-29 DIAGNOSIS — E1122 Type 2 diabetes mellitus with diabetic chronic kidney disease: Secondary | ICD-10-CM | POA: Diagnosis not present

## 2021-06-29 DIAGNOSIS — K219 Gastro-esophageal reflux disease without esophagitis: Secondary | ICD-10-CM | POA: Diagnosis not present

## 2021-06-29 DIAGNOSIS — J449 Chronic obstructive pulmonary disease, unspecified: Secondary | ICD-10-CM | POA: Diagnosis not present

## 2021-06-29 DIAGNOSIS — Z7901 Long term (current) use of anticoagulants: Secondary | ICD-10-CM | POA: Diagnosis not present

## 2021-06-29 DIAGNOSIS — E785 Hyperlipidemia, unspecified: Secondary | ICD-10-CM | POA: Diagnosis not present

## 2021-06-29 DIAGNOSIS — I130 Hypertensive heart and chronic kidney disease with heart failure and stage 1 through stage 4 chronic kidney disease, or unspecified chronic kidney disease: Secondary | ICD-10-CM | POA: Diagnosis not present

## 2021-06-29 DIAGNOSIS — N1832 Chronic kidney disease, stage 3b: Secondary | ICD-10-CM | POA: Diagnosis not present

## 2021-06-29 DIAGNOSIS — I480 Paroxysmal atrial fibrillation: Secondary | ICD-10-CM | POA: Diagnosis not present

## 2021-06-29 DIAGNOSIS — D631 Anemia in chronic kidney disease: Secondary | ICD-10-CM | POA: Diagnosis not present

## 2021-06-29 DIAGNOSIS — Z9181 History of falling: Secondary | ICD-10-CM | POA: Diagnosis not present

## 2021-06-29 DIAGNOSIS — Z87891 Personal history of nicotine dependence: Secondary | ICD-10-CM | POA: Diagnosis not present

## 2021-06-29 DIAGNOSIS — M25512 Pain in left shoulder: Secondary | ICD-10-CM | POA: Diagnosis not present

## 2021-06-29 DIAGNOSIS — I5022 Chronic systolic (congestive) heart failure: Secondary | ICD-10-CM | POA: Diagnosis not present

## 2021-06-29 DIAGNOSIS — M1990 Unspecified osteoarthritis, unspecified site: Secondary | ICD-10-CM | POA: Diagnosis not present

## 2021-06-29 DIAGNOSIS — E1151 Type 2 diabetes mellitus with diabetic peripheral angiopathy without gangrene: Secondary | ICD-10-CM | POA: Diagnosis not present

## 2021-07-04 ENCOUNTER — Telehealth: Payer: Self-pay

## 2021-07-04 DIAGNOSIS — Z9181 History of falling: Secondary | ICD-10-CM | POA: Diagnosis not present

## 2021-07-04 DIAGNOSIS — Z87891 Personal history of nicotine dependence: Secondary | ICD-10-CM | POA: Diagnosis not present

## 2021-07-04 DIAGNOSIS — E1122 Type 2 diabetes mellitus with diabetic chronic kidney disease: Secondary | ICD-10-CM | POA: Diagnosis not present

## 2021-07-04 DIAGNOSIS — Z8701 Personal history of pneumonia (recurrent): Secondary | ICD-10-CM | POA: Diagnosis not present

## 2021-07-04 DIAGNOSIS — K219 Gastro-esophageal reflux disease without esophagitis: Secondary | ICD-10-CM | POA: Diagnosis not present

## 2021-07-04 DIAGNOSIS — N1832 Chronic kidney disease, stage 3b: Secondary | ICD-10-CM | POA: Diagnosis not present

## 2021-07-04 DIAGNOSIS — I5022 Chronic systolic (congestive) heart failure: Secondary | ICD-10-CM | POA: Diagnosis not present

## 2021-07-04 DIAGNOSIS — E1151 Type 2 diabetes mellitus with diabetic peripheral angiopathy without gangrene: Secondary | ICD-10-CM | POA: Diagnosis not present

## 2021-07-04 DIAGNOSIS — M1990 Unspecified osteoarthritis, unspecified site: Secondary | ICD-10-CM | POA: Diagnosis not present

## 2021-07-04 DIAGNOSIS — E785 Hyperlipidemia, unspecified: Secondary | ICD-10-CM | POA: Diagnosis not present

## 2021-07-04 DIAGNOSIS — J449 Chronic obstructive pulmonary disease, unspecified: Secondary | ICD-10-CM | POA: Diagnosis not present

## 2021-07-04 DIAGNOSIS — I480 Paroxysmal atrial fibrillation: Secondary | ICD-10-CM | POA: Diagnosis not present

## 2021-07-04 DIAGNOSIS — D631 Anemia in chronic kidney disease: Secondary | ICD-10-CM | POA: Diagnosis not present

## 2021-07-04 DIAGNOSIS — I130 Hypertensive heart and chronic kidney disease with heart failure and stage 1 through stage 4 chronic kidney disease, or unspecified chronic kidney disease: Secondary | ICD-10-CM | POA: Diagnosis not present

## 2021-07-04 DIAGNOSIS — M25512 Pain in left shoulder: Secondary | ICD-10-CM | POA: Diagnosis not present

## 2021-07-04 DIAGNOSIS — Z7901 Long term (current) use of anticoagulants: Secondary | ICD-10-CM | POA: Diagnosis not present

## 2021-07-04 NOTE — Telephone Encounter (Signed)
   [facility] Pre-operative Risk Assessment    Patient Name: Krista Mason  DOB: 1936-08-04 MRN: [id number]  HEARTCARE STAFF:  - IMPORTANT!!!!!! Under Visit Info/Reason for Call, type in Other and utilize the format Clearance MM/DD/YY or Clearance TBD. Do not use dashes or single digits. - Please review there is not already an duplicate clearance open for this procedure. - If request is for dental extraction, please clarify the # of teeth to be extracted. - If the patient is currently at the dentist's office, call Pre-Op Callback Staff (MA/nurse) to input urgent request.  - If the patient is not currently in the dentist office, please route to the Pre-Op pool.  Request for surgical clearance:  What type of surgery is being performed? Colonoscopy/ Endoscopy   When is this surgery scheduled? 10/04/21  What type of clearance is required (medical clearance vs. Pharmacy clearance to hold med vs. Both)? Both   Are there any medications that need to be held prior to surgery and how Kozinski? Eliquis   Practice name and name of physician performing surgery? [facility], Dr. Alessandra Bevels  What is the office phone number? [phone]   7.   What is the office fax number? [phone]  8.   Anesthesia type (None, local, MAC, general) ? Propofol    Mendel Ryder 07/04/2021, 9:56 AM  _________________________________________________________________   (provider comments below)

## 2021-07-05 NOTE — Telephone Encounter (Signed)
   Name: Krista Mason  DOB: [birth year]  MRN: [id number]   Primary Cardiologist: Glori Bickers, MD  Chart reviewed as part of pre-operative protocol coverage. We have been asked for guidance to hold eliquis for endoscopy.  Per our clinical pharmacist: Per office protocol, patient can hold Eliquis for 1 day prior to procedure given elevated CV risk. If longer hold is required, will need MD input. Resume as soon as safely possible after.   I will route this recommendation to the requesting party via Epic fax function and remove from pre-op pool. Please call with questions.  Tami Lin Cayson Kalb, PA 07/05/2021, 8:48 AM

## 2021-07-05 NOTE — Telephone Encounter (Signed)
Patient with diagnosis of afib on Eliquis for anticoagulation.    Procedure: colonoscopy/endoscopy Date of procedure: 10/04/21  CHA2DS2-VASc Score = 7  This indicates a 11.2% annual risk of stroke. The patient's score is based upon: CHF History: 1 HTN History: 1 Diabetes History: 1 Stroke History: 0 Vascular Disease History: 1 Age Score: 2 Gender Score: 1   CrCl 19mL/min Platelet count 316K  Per office protocol, patient can hold Eliquis for 1 day prior to procedure given elevated CV risk. If longer hold is required, will need MD input. Resume as soon as safely possible after.

## 2021-07-07 DIAGNOSIS — Z7901 Long term (current) use of anticoagulants: Secondary | ICD-10-CM | POA: Diagnosis not present

## 2021-07-07 DIAGNOSIS — D631 Anemia in chronic kidney disease: Secondary | ICD-10-CM | POA: Diagnosis not present

## 2021-07-07 DIAGNOSIS — N1832 Chronic kidney disease, stage 3b: Secondary | ICD-10-CM | POA: Diagnosis not present

## 2021-07-07 DIAGNOSIS — J449 Chronic obstructive pulmonary disease, unspecified: Secondary | ICD-10-CM | POA: Diagnosis not present

## 2021-07-07 DIAGNOSIS — E1122 Type 2 diabetes mellitus with diabetic chronic kidney disease: Secondary | ICD-10-CM | POA: Diagnosis not present

## 2021-07-07 DIAGNOSIS — Z87891 Personal history of nicotine dependence: Secondary | ICD-10-CM | POA: Diagnosis not present

## 2021-07-07 DIAGNOSIS — Z8701 Personal history of pneumonia (recurrent): Secondary | ICD-10-CM | POA: Diagnosis not present

## 2021-07-07 DIAGNOSIS — I480 Paroxysmal atrial fibrillation: Secondary | ICD-10-CM | POA: Diagnosis not present

## 2021-07-07 DIAGNOSIS — E785 Hyperlipidemia, unspecified: Secondary | ICD-10-CM | POA: Diagnosis not present

## 2021-07-07 DIAGNOSIS — I5022 Chronic systolic (congestive) heart failure: Secondary | ICD-10-CM | POA: Diagnosis not present

## 2021-07-07 DIAGNOSIS — K219 Gastro-esophageal reflux disease without esophagitis: Secondary | ICD-10-CM | POA: Diagnosis not present

## 2021-07-07 DIAGNOSIS — E1151 Type 2 diabetes mellitus with diabetic peripheral angiopathy without gangrene: Secondary | ICD-10-CM | POA: Diagnosis not present

## 2021-07-07 DIAGNOSIS — M25512 Pain in left shoulder: Secondary | ICD-10-CM | POA: Diagnosis not present

## 2021-07-07 DIAGNOSIS — M1990 Unspecified osteoarthritis, unspecified site: Secondary | ICD-10-CM | POA: Diagnosis not present

## 2021-07-07 DIAGNOSIS — Z9181 History of falling: Secondary | ICD-10-CM | POA: Diagnosis not present

## 2021-07-07 DIAGNOSIS — I130 Hypertensive heart and chronic kidney disease with heart failure and stage 1 through stage 4 chronic kidney disease, or unspecified chronic kidney disease: Secondary | ICD-10-CM | POA: Diagnosis not present

## 2021-07-10 DIAGNOSIS — D631 Anemia in chronic kidney disease: Secondary | ICD-10-CM | POA: Diagnosis not present

## 2021-07-10 DIAGNOSIS — E1151 Type 2 diabetes mellitus with diabetic peripheral angiopathy without gangrene: Secondary | ICD-10-CM | POA: Diagnosis not present

## 2021-07-10 DIAGNOSIS — N1832 Chronic kidney disease, stage 3b: Secondary | ICD-10-CM | POA: Diagnosis not present

## 2021-07-10 DIAGNOSIS — E785 Hyperlipidemia, unspecified: Secondary | ICD-10-CM | POA: Diagnosis not present

## 2021-07-10 DIAGNOSIS — Z7901 Long term (current) use of anticoagulants: Secondary | ICD-10-CM | POA: Diagnosis not present

## 2021-07-10 DIAGNOSIS — I130 Hypertensive heart and chronic kidney disease with heart failure and stage 1 through stage 4 chronic kidney disease, or unspecified chronic kidney disease: Secondary | ICD-10-CM | POA: Diagnosis not present

## 2021-07-10 DIAGNOSIS — M1990 Unspecified osteoarthritis, unspecified site: Secondary | ICD-10-CM | POA: Diagnosis not present

## 2021-07-10 DIAGNOSIS — I480 Paroxysmal atrial fibrillation: Secondary | ICD-10-CM | POA: Diagnosis not present

## 2021-07-10 DIAGNOSIS — Z8701 Personal history of pneumonia (recurrent): Secondary | ICD-10-CM | POA: Diagnosis not present

## 2021-07-10 DIAGNOSIS — I5022 Chronic systolic (congestive) heart failure: Secondary | ICD-10-CM | POA: Diagnosis not present

## 2021-07-10 DIAGNOSIS — K219 Gastro-esophageal reflux disease without esophagitis: Secondary | ICD-10-CM | POA: Diagnosis not present

## 2021-07-10 DIAGNOSIS — Z87891 Personal history of nicotine dependence: Secondary | ICD-10-CM | POA: Diagnosis not present

## 2021-07-10 DIAGNOSIS — E1122 Type 2 diabetes mellitus with diabetic chronic kidney disease: Secondary | ICD-10-CM | POA: Diagnosis not present

## 2021-07-10 DIAGNOSIS — M25512 Pain in left shoulder: Secondary | ICD-10-CM | POA: Diagnosis not present

## 2021-07-10 DIAGNOSIS — Z9181 History of falling: Secondary | ICD-10-CM | POA: Diagnosis not present

## 2021-07-10 DIAGNOSIS — J449 Chronic obstructive pulmonary disease, unspecified: Secondary | ICD-10-CM | POA: Diagnosis not present

## 2021-07-11 ENCOUNTER — Encounter (HOSPITAL_COMMUNITY)
Admission: RE | Admit: 2021-07-11 | Discharge: 2021-07-11 | Disposition: A | Discharge status: Home / Self Care | Payer: Medicare HMO | Source: Ambulatory Visit | Attending: Nephrology | Admitting: Nephrology

## 2021-07-11 ENCOUNTER — Other Ambulatory Visit: Payer: Self-pay

## 2021-07-11 ENCOUNTER — Encounter (HOSPITAL_COMMUNITY): Payer: Medicare HMO

## 2021-07-11 VITALS — BP 136/59 | HR 66 | Temp 98.4°F | Resp 18

## 2021-07-11 DIAGNOSIS — N183 Chronic kidney disease, stage 3 unspecified: Secondary | ICD-10-CM

## 2021-07-11 DIAGNOSIS — D631 Anemia in chronic kidney disease: Secondary | ICD-10-CM | POA: Diagnosis not present

## 2021-07-11 LAB — FERRITIN: Ferritin: 244 ng/mL (ref 11–307)

## 2021-07-11 LAB — POCT HEMOGLOBIN-HEMACUE: Hemoglobin: 9.6 g/dL — ABNORMAL LOW (ref 12.0–15.0)

## 2021-07-11 LAB — IRON AND TIBC
Iron: 65 ug/dL (ref 28–170)
Saturation Ratios: 18 % (ref 10.4–31.8)
TIBC: 370 ug/dL (ref 250–450)
UIBC: 305 ug/dL

## 2021-07-11 MED ORDER — DARBEPOETIN ALFA 100 MCG/0.5ML IJ SOSY
100.0000 ug | PREFILLED_SYRINGE | INTRAMUSCULAR | Status: DC
  Administered 2021-07-11: 100 ug via SUBCUTANEOUS

## 2021-07-11 MED ORDER — DARBEPOETIN ALFA 100 MCG/0.5ML IJ SOSY
PREFILLED_SYRINGE | INTRAMUSCULAR | Status: AC
  Filled 2021-07-11: qty 0.5

## 2021-07-12 ENCOUNTER — Encounter (HOSPITAL_COMMUNITY): Payer: Medicare HMO

## 2021-07-13 DIAGNOSIS — J449 Chronic obstructive pulmonary disease, unspecified: Secondary | ICD-10-CM | POA: Diagnosis not present

## 2021-07-13 DIAGNOSIS — E1122 Type 2 diabetes mellitus with diabetic chronic kidney disease: Secondary | ICD-10-CM | POA: Diagnosis not present

## 2021-07-13 DIAGNOSIS — D631 Anemia in chronic kidney disease: Secondary | ICD-10-CM | POA: Diagnosis not present

## 2021-07-13 DIAGNOSIS — E1151 Type 2 diabetes mellitus with diabetic peripheral angiopathy without gangrene: Secondary | ICD-10-CM | POA: Diagnosis not present

## 2021-07-13 DIAGNOSIS — Z87891 Personal history of nicotine dependence: Secondary | ICD-10-CM | POA: Diagnosis not present

## 2021-07-13 DIAGNOSIS — I130 Hypertensive heart and chronic kidney disease with heart failure and stage 1 through stage 4 chronic kidney disease, or unspecified chronic kidney disease: Secondary | ICD-10-CM | POA: Diagnosis not present

## 2021-07-13 DIAGNOSIS — I5022 Chronic systolic (congestive) heart failure: Secondary | ICD-10-CM | POA: Diagnosis not present

## 2021-07-13 DIAGNOSIS — I480 Paroxysmal atrial fibrillation: Secondary | ICD-10-CM | POA: Diagnosis not present

## 2021-07-13 DIAGNOSIS — E785 Hyperlipidemia, unspecified: Secondary | ICD-10-CM | POA: Diagnosis not present

## 2021-07-13 DIAGNOSIS — N1832 Chronic kidney disease, stage 3b: Secondary | ICD-10-CM | POA: Diagnosis not present

## 2021-07-13 DIAGNOSIS — M25512 Pain in left shoulder: Secondary | ICD-10-CM | POA: Diagnosis not present

## 2021-07-13 DIAGNOSIS — Z7901 Long term (current) use of anticoagulants: Secondary | ICD-10-CM | POA: Diagnosis not present

## 2021-07-13 DIAGNOSIS — K219 Gastro-esophageal reflux disease without esophagitis: Secondary | ICD-10-CM | POA: Diagnosis not present

## 2021-07-13 DIAGNOSIS — M1990 Unspecified osteoarthritis, unspecified site: Secondary | ICD-10-CM | POA: Diagnosis not present

## 2021-07-13 DIAGNOSIS — Z8701 Personal history of pneumonia (recurrent): Secondary | ICD-10-CM | POA: Diagnosis not present

## 2021-07-13 DIAGNOSIS — Z9181 History of falling: Secondary | ICD-10-CM | POA: Diagnosis not present

## 2021-07-21 ENCOUNTER — Other Ambulatory Visit (HOSPITAL_COMMUNITY): Payer: Self-pay | Admitting: Physician Assistant

## 2021-07-21 ENCOUNTER — Other Ambulatory Visit (HOSPITAL_COMMUNITY): Payer: Self-pay | Admitting: Internal Medicine

## 2021-07-21 ENCOUNTER — Other Ambulatory Visit: Payer: Self-pay | Admitting: Cardiology

## 2021-07-21 DIAGNOSIS — Z87891 Personal history of nicotine dependence: Secondary | ICD-10-CM | POA: Diagnosis not present

## 2021-07-21 DIAGNOSIS — I5022 Chronic systolic (congestive) heart failure: Secondary | ICD-10-CM | POA: Diagnosis not present

## 2021-07-21 DIAGNOSIS — K219 Gastro-esophageal reflux disease without esophagitis: Secondary | ICD-10-CM | POA: Diagnosis not present

## 2021-07-21 DIAGNOSIS — E119 Type 2 diabetes mellitus without complications: Secondary | ICD-10-CM | POA: Diagnosis not present

## 2021-07-21 DIAGNOSIS — Z8701 Personal history of pneumonia (recurrent): Secondary | ICD-10-CM | POA: Diagnosis not present

## 2021-07-21 DIAGNOSIS — J449 Chronic obstructive pulmonary disease, unspecified: Secondary | ICD-10-CM | POA: Diagnosis not present

## 2021-07-21 DIAGNOSIS — N1832 Chronic kidney disease, stage 3b: Secondary | ICD-10-CM | POA: Diagnosis not present

## 2021-07-21 DIAGNOSIS — Z9181 History of falling: Secondary | ICD-10-CM | POA: Diagnosis not present

## 2021-07-21 DIAGNOSIS — I10 Essential (primary) hypertension: Secondary | ICD-10-CM | POA: Diagnosis not present

## 2021-07-21 DIAGNOSIS — E1122 Type 2 diabetes mellitus with diabetic chronic kidney disease: Secondary | ICD-10-CM | POA: Diagnosis not present

## 2021-07-21 DIAGNOSIS — Z7901 Long term (current) use of anticoagulants: Secondary | ICD-10-CM | POA: Diagnosis not present

## 2021-07-21 DIAGNOSIS — N184 Chronic kidney disease, stage 4 (severe): Secondary | ICD-10-CM | POA: Diagnosis not present

## 2021-07-21 DIAGNOSIS — M1990 Unspecified osteoarthritis, unspecified site: Secondary | ICD-10-CM | POA: Diagnosis not present

## 2021-07-21 DIAGNOSIS — I5040 Unspecified combined systolic (congestive) and diastolic (congestive) heart failure: Secondary | ICD-10-CM | POA: Diagnosis not present

## 2021-07-21 DIAGNOSIS — I4891 Unspecified atrial fibrillation: Secondary | ICD-10-CM | POA: Diagnosis not present

## 2021-07-21 DIAGNOSIS — I130 Hypertensive heart and chronic kidney disease with heart failure and stage 1 through stage 4 chronic kidney disease, or unspecified chronic kidney disease: Secondary | ICD-10-CM | POA: Diagnosis not present

## 2021-07-21 DIAGNOSIS — D631 Anemia in chronic kidney disease: Secondary | ICD-10-CM | POA: Diagnosis not present

## 2021-07-21 DIAGNOSIS — M810 Age-related osteoporosis without current pathological fracture: Secondary | ICD-10-CM | POA: Diagnosis not present

## 2021-07-21 DIAGNOSIS — E785 Hyperlipidemia, unspecified: Secondary | ICD-10-CM | POA: Diagnosis not present

## 2021-07-21 DIAGNOSIS — E1151 Type 2 diabetes mellitus with diabetic peripheral angiopathy without gangrene: Secondary | ICD-10-CM | POA: Diagnosis not present

## 2021-07-21 DIAGNOSIS — I480 Paroxysmal atrial fibrillation: Secondary | ICD-10-CM | POA: Diagnosis not present

## 2021-07-21 DIAGNOSIS — M25512 Pain in left shoulder: Secondary | ICD-10-CM | POA: Diagnosis not present

## 2021-07-21 NOTE — Telephone Encounter (Signed)
Prescription refill request for Eliquis received. Indication:afib Last office visit:Nahser, 06/23/2021 Scr: 2.32, 04/14/2021 Age: 85 yo  Weight: 65.1 kg   Refill sent.

## 2021-08-01 ENCOUNTER — Ambulatory Visit (INDEPENDENT_AMBULATORY_CARE_PROVIDER_SITE_OTHER): Payer: Medicare HMO

## 2021-08-01 ENCOUNTER — Telehealth: Payer: Self-pay

## 2021-08-01 DIAGNOSIS — I5022 Chronic systolic (congestive) heart failure: Secondary | ICD-10-CM

## 2021-08-01 LAB — CUP PACEART REMOTE DEVICE CHECK
Battery Remaining Longevity: 99 mo
Battery Voltage: 2.99 V
Brady Statistic AP VP Percent: 79.3 %
Brady Statistic AP VS Percent: 0.05 %
Brady Statistic AS VP Percent: 14.98 %
Brady Statistic AS VS Percent: 5.67 %
Brady Statistic RA Percent Paced: 79.04 %
Brady Statistic RV Percent Paced: 94.28 %
Date Time Interrogation Session: 20221011021722
Implantable Lead Implant Date: 20201002
Implantable Lead Implant Date: 20201002
Implantable Lead Implant Date: 20201002
Implantable Lead Location: 753858
Implantable Lead Location: 753859
Implantable Lead Location: 753860
Implantable Lead Model: 4598
Implantable Lead Model: 5076
Implantable Lead Model: 5076
Implantable Pulse Generator Implant Date: 20201002
Lead Channel Impedance Value: 323 Ohm
Lead Channel Impedance Value: 323 Ohm
Lead Channel Impedance Value: 361 Ohm
Lead Channel Impedance Value: 361 Ohm
Lead Channel Impedance Value: 380 Ohm
Lead Channel Impedance Value: 437 Ohm
Lead Channel Impedance Value: 437 Ohm
Lead Channel Impedance Value: 513 Ohm
Lead Channel Impedance Value: 532 Ohm
Lead Channel Impedance Value: 551 Ohm
Lead Channel Impedance Value: 570 Ohm
Lead Channel Impedance Value: 570 Ohm
Lead Channel Impedance Value: 589 Ohm
Lead Channel Impedance Value: 589 Ohm
Lead Channel Pacing Threshold Amplitude: 0.5 V
Lead Channel Pacing Threshold Amplitude: 0.5 V
Lead Channel Pacing Threshold Amplitude: 2.125 V
Lead Channel Pacing Threshold Pulse Width: 0.4 ms
Lead Channel Pacing Threshold Pulse Width: 0.4 ms
Lead Channel Pacing Threshold Pulse Width: 0.4 ms
Lead Channel Sensing Intrinsic Amplitude: 1.25 mV
Lead Channel Sensing Intrinsic Amplitude: 1.25 mV
Lead Channel Sensing Intrinsic Amplitude: 31.25 mV
Lead Channel Sensing Intrinsic Amplitude: 31.25 mV
Lead Channel Setting Pacing Amplitude: 1.5 V
Lead Channel Setting Pacing Amplitude: 2 V
Lead Channel Setting Pacing Amplitude: 2 V
Lead Channel Setting Pacing Pulse Width: 0.4 ms
Lead Channel Setting Pacing Pulse Width: 0.4 ms
Lead Channel Setting Sensing Sensitivity: 1.2 mV

## 2021-08-01 NOTE — Telephone Encounter (Signed)
Scheduled remote reviewed. Normal device function.   LV pacing threshold 2.125V (monitor), programmed 2.0V. Last OV manual pacing threshold 1.625V on 11/2020. Lead trend shows approx 2.0V-2.125 recently. BiV 94%/Eff 84% Routing for further review.  Attempted to contact patient to schedule device clinic apt. Will need to bring in program device for appropriate safety margin.   No answer, LMTCB.

## 2021-08-02 NOTE — Telephone Encounter (Signed)
Patient returning call. Discuss need for in-clinic testing of LV threshold. RV threshold WNL. Patient lives as assisted living facility and transportation only comes to [place] on Tuesdays. Patient states she has an appointment at [facility] Tuesday 10/18 at 10:00 am and it would be convenient to present to device clinic for testing afterwards. Appointment scheduled for 08/08/21 at 10:45 for manual interrogation of patients device.

## 2021-08-02 NOTE — Telephone Encounter (Signed)
Patient left vm returning nurse call she would like a call back

## 2021-08-07 ENCOUNTER — Telehealth (HOSPITAL_COMMUNITY): Payer: Self-pay | Admitting: Pharmacy Technician

## 2021-08-07 ENCOUNTER — Other Ambulatory Visit (HOSPITAL_COMMUNITY): Payer: Self-pay | Admitting: *Deleted

## 2021-08-07 NOTE — Telephone Encounter (Signed)
Advanced Heart Failure Patient Advocate Encounter   Patient was approved to receive Eliquis from [facility]  Patient ID: [id number] Effective dates: 08/04/21 through 10/21/21  Called and left the patient message.   Charlann Boxer, CPhT

## 2021-08-08 ENCOUNTER — Ambulatory Visit (HOSPITAL_COMMUNITY)
Admission: RE | Admit: 2021-08-08 | Discharge: 2021-08-08 | Disposition: A | Discharge status: Home / Self Care | Payer: Medicare HMO | Source: Ambulatory Visit | Attending: Nephrology | Admitting: Nephrology

## 2021-08-08 ENCOUNTER — Ambulatory Visit (INDEPENDENT_AMBULATORY_CARE_PROVIDER_SITE_OTHER): Payer: Medicare HMO

## 2021-08-08 ENCOUNTER — Other Ambulatory Visit: Payer: Self-pay

## 2021-08-08 VITALS — BP 133/52 | HR 69 | Temp 97.1°F | Resp 18

## 2021-08-08 DIAGNOSIS — I428 Other cardiomyopathies: Secondary | ICD-10-CM

## 2021-08-08 DIAGNOSIS — N184 Chronic kidney disease, stage 4 (severe): Secondary | ICD-10-CM | POA: Insufficient documentation

## 2021-08-08 DIAGNOSIS — D631 Anemia in chronic kidney disease: Secondary | ICD-10-CM | POA: Insufficient documentation

## 2021-08-08 DIAGNOSIS — N183 Chronic kidney disease, stage 3 unspecified: Secondary | ICD-10-CM | POA: Insufficient documentation

## 2021-08-08 LAB — FERRITIN: Ferritin: 33 ng/mL (ref 11–307)

## 2021-08-08 LAB — IRON AND TIBC
Iron: 27 ug/dL — ABNORMAL LOW (ref 28–170)
Saturation Ratios: 7 % — ABNORMAL LOW (ref 10.4–31.8)
TIBC: 396 ug/dL (ref 250–450)
UIBC: 369 ug/dL

## 2021-08-08 LAB — POCT HEMOGLOBIN-HEMACUE: Hemoglobin: 8.7 g/dL — ABNORMAL LOW (ref 12.0–15.0)

## 2021-08-08 MED ORDER — DARBEPOETIN ALFA 100 MCG/0.5ML IJ SOSY
100.0000 ug | PREFILLED_SYRINGE | INTRAMUSCULAR | Status: DC
  Administered 2021-08-08: 100 ug via SUBCUTANEOUS

## 2021-08-08 MED ORDER — DARBEPOETIN ALFA 100 MCG/0.5ML IJ SOSY
PREFILLED_SYRINGE | INTRAMUSCULAR | Status: AC
  Filled 2021-08-08: qty 0.5

## 2021-08-09 LAB — CUP PACEART INCLINIC DEVICE CHECK
Battery Remaining Longevity: 94 mo
Battery Voltage: 2.99 V
Brady Statistic AP VP Percent: 75.38 %
Brady Statistic AP VS Percent: 0.07 %
Brady Statistic AS VP Percent: 19.4 %
Brady Statistic AS VS Percent: 5.15 %
Brady Statistic RA Percent Paced: 75.02 %
Brady Statistic RV Percent Paced: 94.68 %
Date Time Interrogation Session: 20221018122100
Implantable Lead Implant Date: 20201002
Implantable Lead Implant Date: 20201002
Implantable Lead Implant Date: 20201002
Implantable Lead Location: 753858
Implantable Lead Location: 753859
Implantable Lead Location: 753860
Implantable Lead Model: 4598
Implantable Lead Model: 5076
Implantable Lead Model: 5076
Implantable Pulse Generator Implant Date: 20201002
Lead Channel Impedance Value: 304 Ohm
Lead Channel Impedance Value: 323 Ohm
Lead Channel Impedance Value: 361 Ohm
Lead Channel Impedance Value: 361 Ohm
Lead Channel Impedance Value: 399 Ohm
Lead Channel Impedance Value: 418 Ohm
Lead Channel Impedance Value: 475 Ohm
Lead Channel Impedance Value: 513 Ohm
Lead Channel Impedance Value: 551 Ohm
Lead Channel Impedance Value: 570 Ohm
Lead Channel Impedance Value: 570 Ohm
Lead Channel Impedance Value: 589 Ohm
Lead Channel Impedance Value: 608 Ohm
Lead Channel Impedance Value: 608 Ohm
Lead Channel Pacing Threshold Amplitude: 0.5 V
Lead Channel Pacing Threshold Amplitude: 0.75 V
Lead Channel Pacing Threshold Amplitude: 1.75 V
Lead Channel Pacing Threshold Pulse Width: 0.4 ms
Lead Channel Pacing Threshold Pulse Width: 0.4 ms
Lead Channel Pacing Threshold Pulse Width: 0.4 ms
Lead Channel Sensing Intrinsic Amplitude: 2.25 mV
Lead Channel Sensing Intrinsic Amplitude: 31.625 mV
Lead Channel Setting Pacing Amplitude: 1.5 V
Lead Channel Setting Pacing Amplitude: 2 V
Lead Channel Setting Pacing Amplitude: 2.25 V
Lead Channel Setting Pacing Pulse Width: 0.4 ms
Lead Channel Setting Pacing Pulse Width: 0.4 ms
Lead Channel Setting Sensing Sensitivity: 1.2 mV

## 2021-08-09 NOTE — Progress Notes (Signed)
CRT-P device check in clinic to check rising LV threshold and to adjust for appropriate safety margin. Previous remote detected RV threshold at 2.125 with amplitude set at 2.0V. Manual testing today revealed LV thresheold of 1.75V. LV amplitude adjusted to 2.5V for appropriate safety margin 0.5V above threshold. Normal device function. RA, RV thresholds, sensing, impedance consistent with previous measurements. Histograms appropriate for patient and level of activity. No mode switches or ventricular high rate episodes. Patient bi-ventricularly pacing 94% of the time. Device heart failure diagnostics are within normal limits and stable over time. Estimated longevity 7 years, 10 months. Patient enrolled in remote follow-up. Plan to check device remotely in 3 months and every 6 months in office.

## 2021-08-10 ENCOUNTER — Encounter (HOSPITAL_COMMUNITY)
Admission: RE | Admit: 2021-08-10 | Discharge: 2021-08-10 | Disposition: A | Discharge status: Home / Self Care | Payer: Medicare HMO | Source: Ambulatory Visit | Attending: Nephrology | Admitting: Nephrology

## 2021-08-10 DIAGNOSIS — D631 Anemia in chronic kidney disease: Secondary | ICD-10-CM | POA: Insufficient documentation

## 2021-08-10 DIAGNOSIS — N189 Chronic kidney disease, unspecified: Secondary | ICD-10-CM | POA: Diagnosis not present

## 2021-08-10 MED ORDER — SODIUM CHLORIDE 0.9 % IV SOLN
510.0000 mg | INTRAVENOUS | Status: DC
  Administered 2021-08-10: 510 mg via INTRAVENOUS
  Filled 2021-08-10: qty 510

## 2021-08-10 NOTE — Progress Notes (Signed)
Remote pacemaker transmission.   

## 2021-08-15 DIAGNOSIS — D509 Iron deficiency anemia, unspecified: Secondary | ICD-10-CM | POA: Diagnosis not present

## 2021-08-15 DIAGNOSIS — I129 Hypertensive chronic kidney disease with stage 1 through stage 4 chronic kidney disease, or unspecified chronic kidney disease: Secondary | ICD-10-CM | POA: Diagnosis not present

## 2021-08-15 DIAGNOSIS — I4891 Unspecified atrial fibrillation: Secondary | ICD-10-CM | POA: Diagnosis not present

## 2021-08-15 DIAGNOSIS — E785 Hyperlipidemia, unspecified: Secondary | ICD-10-CM | POA: Diagnosis not present

## 2021-08-15 DIAGNOSIS — K2210 Ulcer of esophagus without bleeding: Secondary | ICD-10-CM | POA: Diagnosis not present

## 2021-08-15 DIAGNOSIS — N184 Chronic kidney disease, stage 4 (severe): Secondary | ICD-10-CM | POA: Diagnosis not present

## 2021-08-29 DIAGNOSIS — I4891 Unspecified atrial fibrillation: Secondary | ICD-10-CM | POA: Diagnosis not present

## 2021-08-29 DIAGNOSIS — K2210 Ulcer of esophagus without bleeding: Secondary | ICD-10-CM | POA: Diagnosis not present

## 2021-08-29 DIAGNOSIS — D509 Iron deficiency anemia, unspecified: Secondary | ICD-10-CM | POA: Diagnosis not present

## 2021-08-29 DIAGNOSIS — N184 Chronic kidney disease, stage 4 (severe): Secondary | ICD-10-CM | POA: Diagnosis not present

## 2021-08-29 DIAGNOSIS — E785 Hyperlipidemia, unspecified: Secondary | ICD-10-CM | POA: Diagnosis not present

## 2021-08-29 DIAGNOSIS — I129 Hypertensive chronic kidney disease with stage 1 through stage 4 chronic kidney disease, or unspecified chronic kidney disease: Secondary | ICD-10-CM | POA: Diagnosis not present

## 2021-09-05 ENCOUNTER — Other Ambulatory Visit: Payer: Self-pay

## 2021-09-05 ENCOUNTER — Ambulatory Visit (HOSPITAL_COMMUNITY)
Admission: RE | Admit: 2021-09-05 | Discharge: 2021-09-05 | Disposition: A | Discharge status: Home / Self Care | Payer: Medicare HMO | Source: Ambulatory Visit | Attending: Nephrology | Admitting: Nephrology

## 2021-09-05 VITALS — BP 120/52 | HR 70 | Temp 97.7°F | Resp 18

## 2021-09-05 DIAGNOSIS — N183 Chronic kidney disease, stage 3 unspecified: Secondary | ICD-10-CM | POA: Insufficient documentation

## 2021-09-05 DIAGNOSIS — D631 Anemia in chronic kidney disease: Secondary | ICD-10-CM | POA: Insufficient documentation

## 2021-09-05 LAB — FERRITIN: Ferritin: 68 ng/mL (ref 11–307)

## 2021-09-05 LAB — POCT HEMOGLOBIN-HEMACUE: Hemoglobin: 8.9 g/dL — ABNORMAL LOW (ref 12.0–15.0)

## 2021-09-05 LAB — IRON AND TIBC
Iron: 36 ug/dL (ref 28–170)
Saturation Ratios: 10 % — ABNORMAL LOW (ref 10.4–31.8)
TIBC: 372 ug/dL (ref 250–450)
UIBC: 336 ug/dL

## 2021-09-05 MED ORDER — DARBEPOETIN ALFA 100 MCG/0.5ML IJ SOSY
PREFILLED_SYRINGE | INTRAMUSCULAR | Status: AC
  Administered 2021-09-05: 100 ug via SUBCUTANEOUS
  Filled 2021-09-05: qty 0.5

## 2021-09-05 MED ORDER — DARBEPOETIN ALFA 100 MCG/0.5ML IJ SOSY: 100.0000 ug | PREFILLED_SYRINGE | INTRAMUSCULAR | Status: DC

## 2021-09-19 DIAGNOSIS — I4891 Unspecified atrial fibrillation: Secondary | ICD-10-CM | POA: Diagnosis not present

## 2021-09-19 DIAGNOSIS — R809 Proteinuria, unspecified: Secondary | ICD-10-CM | POA: Diagnosis not present

## 2021-09-19 DIAGNOSIS — Z0000 Encounter for general adult medical examination without abnormal findings: Secondary | ICD-10-CM | POA: Diagnosis not present

## 2021-09-19 DIAGNOSIS — R0989 Other specified symptoms and signs involving the circulatory and respiratory systems: Secondary | ICD-10-CM | POA: Diagnosis not present

## 2021-09-19 DIAGNOSIS — I5022 Chronic systolic (congestive) heart failure: Secondary | ICD-10-CM | POA: Diagnosis not present

## 2021-09-19 DIAGNOSIS — N184 Chronic kidney disease, stage 4 (severe): Secondary | ICD-10-CM | POA: Diagnosis not present

## 2021-09-19 DIAGNOSIS — I10 Essential (primary) hypertension: Secondary | ICD-10-CM | POA: Diagnosis not present

## 2021-09-19 DIAGNOSIS — D500 Iron deficiency anemia secondary to blood loss (chronic): Secondary | ICD-10-CM | POA: Diagnosis not present

## 2021-09-19 DIAGNOSIS — E785 Hyperlipidemia, unspecified: Secondary | ICD-10-CM | POA: Diagnosis not present

## 2021-09-19 DIAGNOSIS — E119 Type 2 diabetes mellitus without complications: Secondary | ICD-10-CM | POA: Diagnosis not present

## 2021-10-03 ENCOUNTER — Encounter (HOSPITAL_COMMUNITY): Payer: Self-pay | Admitting: Emergency Medicine

## 2021-10-03 ENCOUNTER — Ambulatory Visit (HOSPITAL_COMMUNITY)
Admission: RE | Admit: 2021-10-03 | Discharge: 2021-10-03 | Disposition: A | Discharge status: Home / Self Care | Payer: Medicare HMO | Source: Ambulatory Visit | Attending: Nephrology | Admitting: Nephrology

## 2021-10-03 ENCOUNTER — Emergency Department (HOSPITAL_COMMUNITY)
Admission: EM | Admit: 2021-10-03 | Discharge: 2021-10-03 | Disposition: A | Discharge status: Home / Self Care | Payer: Medicare HMO | Attending: Emergency Medicine | Admitting: Emergency Medicine

## 2021-10-03 ENCOUNTER — Other Ambulatory Visit: Payer: Self-pay

## 2021-10-03 ENCOUNTER — Emergency Department (HOSPITAL_COMMUNITY): Payer: Medicare HMO

## 2021-10-03 VITALS — BP 128/43 | HR 70 | Temp 98.0°F | Resp 20

## 2021-10-03 DIAGNOSIS — D500 Iron deficiency anemia secondary to blood loss (chronic): Secondary | ICD-10-CM | POA: Diagnosis not present

## 2021-10-03 DIAGNOSIS — D649 Anemia, unspecified: Secondary | ICD-10-CM | POA: Insufficient documentation

## 2021-10-03 DIAGNOSIS — N183 Chronic kidney disease, stage 3 unspecified: Secondary | ICD-10-CM

## 2021-10-03 DIAGNOSIS — I517 Cardiomegaly: Secondary | ICD-10-CM | POA: Diagnosis not present

## 2021-10-03 DIAGNOSIS — E1122 Type 2 diabetes mellitus with diabetic chronic kidney disease: Secondary | ICD-10-CM | POA: Insufficient documentation

## 2021-10-03 DIAGNOSIS — D631 Anemia in chronic kidney disease: Secondary | ICD-10-CM | POA: Insufficient documentation

## 2021-10-03 DIAGNOSIS — I130 Hypertensive heart and chronic kidney disease with heart failure and stage 1 through stage 4 chronic kidney disease, or unspecified chronic kidney disease: Secondary | ICD-10-CM | POA: Diagnosis not present

## 2021-10-03 DIAGNOSIS — Z87891 Personal history of nicotine dependence: Secondary | ICD-10-CM | POA: Insufficient documentation

## 2021-10-03 DIAGNOSIS — K922 Gastrointestinal hemorrhage, unspecified: Secondary | ICD-10-CM | POA: Insufficient documentation

## 2021-10-03 DIAGNOSIS — J449 Chronic obstructive pulmonary disease, unspecified: Secondary | ICD-10-CM | POA: Insufficient documentation

## 2021-10-03 DIAGNOSIS — Z79899 Other long term (current) drug therapy: Secondary | ICD-10-CM | POA: Diagnosis not present

## 2021-10-03 DIAGNOSIS — K921 Melena: Secondary | ICD-10-CM | POA: Diagnosis not present

## 2021-10-03 DIAGNOSIS — R0602 Shortness of breath: Secondary | ICD-10-CM | POA: Diagnosis not present

## 2021-10-03 DIAGNOSIS — I5042 Chronic combined systolic (congestive) and diastolic (congestive) heart failure: Secondary | ICD-10-CM | POA: Diagnosis not present

## 2021-10-03 DIAGNOSIS — R000 Tachycardia, unspecified: Secondary | ICD-10-CM | POA: Diagnosis not present

## 2021-10-03 DIAGNOSIS — Z7901 Long term (current) use of anticoagulants: Secondary | ICD-10-CM | POA: Diagnosis not present

## 2021-10-03 LAB — URINALYSIS, ROUTINE W REFLEX MICROSCOPIC
Bilirubin Urine: NEGATIVE
Glucose, UA: NEGATIVE mg/dL
Hgb urine dipstick: NEGATIVE
Ketones, ur: NEGATIVE mg/dL
Nitrite: NEGATIVE
Protein, ur: NEGATIVE mg/dL
Specific Gravity, Urine: 1.01 (ref 1.005–1.030)
pH: 8 (ref 5.0–8.0)

## 2021-10-03 LAB — URINALYSIS, MICROSCOPIC (REFLEX)

## 2021-10-03 LAB — CBC WITH DIFFERENTIAL/PLATELET
Abs Immature Granulocytes: 0.02 10*3/uL (ref 0.00–0.07)
Basophils Absolute: 0 10*3/uL (ref 0.0–0.1)
Basophils Relative: 0 %
Eosinophils Absolute: 0 10*3/uL (ref 0.0–0.5)
Eosinophils Relative: 1 %
HCT: 23.2 % — ABNORMAL LOW (ref 36.0–46.0)
Hemoglobin: 7.2 g/dL — ABNORMAL LOW (ref 12.0–15.0)
Immature Granulocytes: 0 %
Lymphocytes Relative: 17 %
Lymphs Abs: 1.1 10*3/uL (ref 0.7–4.0)
MCH: 26.8 pg (ref 26.0–34.0)
MCHC: 31 g/dL (ref 30.0–36.0)
MCV: 86.2 fL (ref 80.0–100.0)
Monocytes Absolute: 0.5 10*3/uL (ref 0.1–1.0)
Monocytes Relative: 8 %
Neutro Abs: 4.8 10*3/uL (ref 1.7–7.7)
Neutrophils Relative %: 74 %
Platelets: 249 10*3/uL (ref 150–400)
RBC: 2.69 MIL/uL — ABNORMAL LOW (ref 3.87–5.11)
RDW: 17.9 % — ABNORMAL HIGH (ref 11.5–15.5)
WBC: 6.5 10*3/uL (ref 4.0–10.5)
nRBC: 0 % (ref 0.0–0.2)

## 2021-10-03 LAB — COMPREHENSIVE METABOLIC PANEL
ALT: 14 U/L (ref 0–44)
AST: 16 U/L (ref 15–41)
Albumin: 3.4 g/dL — ABNORMAL LOW (ref 3.5–5.0)
Alkaline Phosphatase: 67 U/L (ref 38–126)
Anion gap: 9 (ref 5–15)
BUN: 43 mg/dL — ABNORMAL HIGH (ref 8–23)
CO2: 25 mmol/L (ref 22–32)
Calcium: 9.6 mg/dL (ref 8.9–10.3)
Chloride: 98 mmol/L (ref 98–111)
Creatinine, Ser: 2.1 mg/dL — ABNORMAL HIGH (ref 0.44–1.00)
GFR, Estimated: 23 mL/min — ABNORMAL LOW (ref >60)
Glucose, Bld: 224 mg/dL — ABNORMAL HIGH (ref 70–99)
Potassium: 4.8 mmol/L (ref 3.5–5.1)
Sodium: 132 mmol/L — ABNORMAL LOW (ref 135–145)
Total Bilirubin: 0.9 mg/dL (ref 0.3–1.2)
Total Protein: 7.1 g/dL (ref 6.5–8.1)

## 2021-10-03 LAB — PREPARE RBC (CROSSMATCH)

## 2021-10-03 LAB — POCT HEMOGLOBIN-HEMACUE: Hemoglobin: 6.6 g/dL — CL (ref 12.0–15.0)

## 2021-10-03 LAB — BRAIN NATRIURETIC PEPTIDE: B Natriuretic Peptide: 268.6 pg/mL — ABNORMAL HIGH (ref 0.0–100.0)

## 2021-10-03 MED ORDER — SODIUM CHLORIDE 0.9 % IV SOLN
510.0000 mg | INTRAVENOUS | Status: DC
  Administered 2021-10-03: 510 mg via INTRAVENOUS
  Filled 2021-10-03: qty 510

## 2021-10-03 MED ORDER — DARBEPOETIN ALFA 100 MCG/0.5ML IJ SOSY
PREFILLED_SYRINGE | INTRAMUSCULAR | Status: AC
  Filled 2021-10-03: qty 0.5

## 2021-10-03 MED ORDER — DARBEPOETIN ALFA 100 MCG/0.5ML IJ SOSY
100.0000 ug | PREFILLED_SYRINGE | INTRAMUSCULAR | Status: DC
  Administered 2021-10-03: 100 ug via SUBCUTANEOUS

## 2021-10-03 MED ORDER — TORSEMIDE 20 MG PO TABS
20.0000 mg | ORAL_TABLET | Freq: Every day | ORAL | Status: DC
  Administered 2021-10-03: 20 mg via ORAL
  Filled 2021-10-03: qty 1

## 2021-10-03 MED ORDER — SODIUM CHLORIDE 0.9 % IV SOLN: 10.0000 mL/h | Freq: Once | INTRAVENOUS | Status: DC

## 2021-10-03 MED ORDER — PANTOPRAZOLE SODIUM 40 MG IV SOLR
40.0000 mg | Freq: Once | INTRAVENOUS | Status: AC
  Administered 2021-10-03: 40 mg via INTRAVENOUS
  Filled 2021-10-03: qty 40

## 2021-10-03 NOTE — Discharge Instructions (Addendum)
Please hold your Eliquis/blood thinner until after you are seen by gastroenterology tomorrow  Return to emergency department for any worsening or worrisome symptoms.

## 2021-10-03 NOTE — ED Triage Notes (Signed)
Pt states she was at the hospital receiving an iron infusion and was told to come to ED for Hgb 6.6.  Reports SOB and generalized weakness that has progressively gotten worse x 1 month.  Denies pain.  Also reports black stools and states she is scheduled for endo tomorrow.

## 2021-10-03 NOTE — ED Provider Notes (Signed)
[facility] EMERGENCY DEPARTMENT Provider Note   CSN: [id number] Arrival date & time: 10/03/21  1043     History Chief Complaint  Patient presents with   abnormal labs   Shortness of Breath   Weakness    Krista Mason is a 85 y.o. female.  This is a 85 y.o. female with significant medical history as below, including anemia, prior UGI bleed who presents to the ED with complaint of low hemoglobin.  Patient was at iron infusion center today for monthly iron infusions and was noted to have a hemoglobin of 6.6.  She was sent by infusion center for evaluation.  Patient does report worsening exertional dyspnea over the past few weeks, fatigue, melena.  She has appoint with gastroenterology tomorrow morning for endoscopy. [facility].   Denies fevers, chills, nausea, vomiting, chest pain.  No congestion or rhinorrhea.  No hematemesis, no bright red blood per rectum, no hematuria.  Patient is anticoagulated on Eliquis, does have increased bruising.  Known history of atrial fibrillation.  Denies transfusion hx  The history is provided by the patient. No language interpreter was used.  Shortness of Breath Associated symptoms: no abdominal pain, no chest pain, no cough, no fever, no headaches and no rash   Weakness Associated symptoms: shortness of breath   Associated symptoms: no abdominal pain, no chest pain, no cough, no dysuria, no fever, no headaches and no nausea       Past Medical History:  Diagnosis Date   Anemia    Atrial fibrillation (HCC)    Chronic combined systolic and diastolic CHF (congestive heart failure) ([place])    a. LV dysfcuntion felt to be related to PVCs   Chronic kidney disease    Diabetes mellitus without complication (HCC)    GERD (gastroesophageal reflux disease)    Hx of echocardiogram    Echo (1/16):  EF 60-65%, no RWMA, Gr 1 DD, mod AI, mod MR, mild LAE   Hyperkalemia    Hyperlipidemia    Hypertension    NICM (nonischemic cardiomyopathy)  ([place])    a. 12/2018 Echo: EF 35%, diff HK w/ septal-lateral dyssynchrony. Nl RV fxn; b. 07/2019 s/p MDT [id number] [name] CRT-P MRI SureScan device (ser #: [id number])   Orthostatic hypotension    PVC (premature ventricular contraction)    a. s/p PVC ablation in 11/2016   Rheumatic fever    Wears dentures     Patient Active Problem List   Diagnosis Date Noted   Paroxysmal atrial fibrillation ([place]) 09/10/2019   Secondary hypercoagulable state ([place]) 09/10/2019   Nonischemic cardiomyopathy ([name]) 07/24/2019   Acute hypoxemic respiratory failure ([place])    COPD  GOLD 0  08/18/2535   Chronic systolic CHF (congestive heart failure) ([place]) 11/13/2016   PVC (premature ventricular contraction) 11/13/2016   Mixed hyperlipidemia 11/05/2016   CKD (chronic kidney disease), stage III ([place]) 11/05/2016   Tobacco abuse 11/05/2016   Multifocal PVCs 11/05/2016   Moderate mitral regurgitation 11/05/2016   Microcytic anemia 11/05/2016   Diabetes mellitus with complication ([place])    Uncontrolled hypertension 06/18/2016   Multifocal atrial tachycardia ([place]) 11/26/2014   PVD (peripheral vascular disease) ([place]) 12/15/2012    Past Surgical History:  Procedure Laterality Date   BALLOON DILATION N/A 07/12/2020   Procedure: BALLOON DILATION;  Surgeon: Ronald Lobo, MD;  Location: [facility];  Service: Endoscopy;  Laterality: N/A;   BIOPSY  08/20/2019   Procedure: BIOPSY;  Surgeon: Otis Brace, MD;  Location: [facility];  Service:  Gastroenterology;;   BIV PACEMAKER INSERTION CRT-P N/A 07/24/2019   Procedure: BIV PACEMAKER INSERTION CRT-P;  Surgeon: Constance Haw, MD;  Location: [facility];  Service: Cardiovascular;  Laterality: N/A;   COLONOSCOPY WITH PROPOFOL N/A 08/22/2019   Procedure: COLONOSCOPY WITH PROPOFOL;  Surgeon: Laurence Spates, MD;  Location: [facility];  Service: Endoscopy;  Laterality: N/A;   ESOPHAGOGASTRODUODENOSCOPY N/A 02/28/2021   Procedure:  ESOPHAGOGASTRODUODENOSCOPY (EGD);  Surgeon: Wilford Corner, MD;  Location: [facility];  Service: Endoscopy;  Laterality: N/A;   ESOPHAGOGASTRODUODENOSCOPY (EGD) WITH PROPOFOL N/A 09/25/2018   Procedure: ESOPHAGOGASTRODUODENOSCOPY (EGD) WITH PROPOFOL;  Surgeon: Ronald Lobo, MD;  [facility];  Service: Endoscopy;  Laterality: N/A;   ESOPHAGOGASTRODUODENOSCOPY (EGD) WITH PROPOFOL N/A 10/29/2018   Procedure: ESOPHAGOGASTRODUODENOSCOPY (EGD) WITH PROPOFOL;  Surgeon: Ronald Lobo, MD;  [facility];  Service: Endoscopy;  Laterality: N/A;   ESOPHAGOGASTRODUODENOSCOPY (EGD) WITH PROPOFOL N/A 08/20/2019   Procedure: ESOPHAGOGASTRODUODENOSCOPY (EGD) WITH PROPOFOL;  Surgeon: Otis Brace, MD;  Location: [facility];  Service: Gastroenterology;  Laterality: N/A;   ESOPHAGOGASTRODUODENOSCOPY (EGD) WITH PROPOFOL N/A 05/03/2020   Procedure: ESOPHAGOGASTRODUODENOSCOPY (EGD) WITH PROPOFOL;  Surgeon: Ronald Lobo, MD;  [facility];  Service: Endoscopy;  Laterality: N/A;   ESOPHAGOGASTRODUODENOSCOPY (EGD) WITH PROPOFOL N/A 06/02/2020   Procedure: ESOPHAGOGASTRODUODENOSCOPY (EGD) WITH PROPOFOL and Savory Dilatation;  Surgeon: Clarene Essex, MD;  [facility];  Service: Gastroenterology;  Laterality: N/A;   ESOPHAGOGASTRODUODENOSCOPY (EGD) WITH PROPOFOL N/A 07/12/2020   Procedure: ESOPHAGOGASTRODUODENOSCOPY (EGD) WITH PROPOFOL with dilatation;  Surgeon: Ronald Lobo, MD;  [facility];  Service: Endoscopy;  Laterality: N/A;   ESOPHAGOGASTRODUODENOSCOPY (EGD) WITH PROPOFOL N/A 05/31/2021   Procedure: ESOPHAGOGASTRODUODENOSCOPY (EGD) WITH PROPOFOL;  Surgeon: Otis Brace, MD;  [facility];  Service: Gastroenterology;  Laterality: N/A;   HEMOSTASIS CLIP PLACEMENT  08/22/2019   Procedure: HEMOSTASIS CLIP PLACEMENT;  Surgeon: Laurence Spates, MD;  Location: [facility];  Service: Endoscopy;;   KNEE SURGERY Left 2009   OVARIAN CYST REMOVAL      POLYPECTOMY  08/22/2019   Procedure: POLYPECTOMY;  Surgeon: Laurence Spates, MD;  Location: [facility];  Service: Endoscopy;;   POLYPECTOMY  05/31/2021   Procedure: POLYPECTOMY;  Surgeon: Otis Brace, MD;  [facility];  Service: Gastroenterology;;   PVC ABLATION N/A 11/30/2016   Procedure: PVC Ablation;  Surgeon: Will [name], MD;  Location: [facility];  Service: Cardiovascular;  Laterality: N/A;   RIGHT HEART CATH N/A 01/31/2018   Procedure: RIGHT HEART CATH;  Surgeon: Jolaine Artist, MD;  Location: [facility];  Service: Cardiovascular;  Laterality: N/A;   RIGHT/LEFT HEART CATH AND CORONARY ANGIOGRAPHY N/A 01/25/2017   Procedure: Right/Left Heart Cath and Coronary Angiography;  Surgeon: Jolaine Artist, MD;  Location: [facility];  Service: Cardiovascular;  Laterality: N/A;   SAVORY DILATION N/A 09/25/2018   Procedure: SAVORY DILATION;  Surgeon: Ronald Lobo, MD;  [facility];  Service: Endoscopy;  Laterality: N/A;   SAVORY DILATION N/A 10/29/2018   Procedure: SAVORY DILATION;  Surgeon: Ronald Lobo, MD;  [facility];  Service: Endoscopy;  Laterality: N/A;   SAVORY DILATION N/A 08/20/2019   Procedure: SAVORY DILATION;  Surgeon: Otis Brace, MD;  [facility]: [facility];  Service: Gastroenterology;  Laterality: N/A;   SAVORY DILATION N/A 05/03/2020   Procedure: SAVORY DILATION;  Surgeon: Ronald Lobo, MD;  [facility];  Service: Endoscopy;  Laterality: N/A;  Patient needs ultraslim pediatric upper endoscope/     no fluoro per dr.  buccini/kh   SAVORY DILATION N/A 06/02/2020   Procedure: SAVORY DILATION w/ Burtis Junes;  Surgeon: Clarene Essex, MD;  [facility];  Service: Gastroenterology;  Laterality: N/A;     OB History   No obstetric history on file.     Family History  Problem Relation Age of Onset   Heart attack Father    Heart disease Father    Cancer Father    Hypertension Father     Hypertension Brother    Stroke Brother    Hypertension Brother    Diabetes Son    Hyperlipidemia Son    Hypertension Son     Social History   Tobacco Use   Smoking status: Former    Packs/day: 0.75    Years: 60.00    Pack years: 45.00    Types: Cigarettes    Quit date: 11/04/2016    Years since quitting: 4.9   Smokeless tobacco: Never  Vaping Use   Vaping Use: Never used  Substance Use Topics   Alcohol use: No   Drug use: No    Home Medications Prior to Admission medications   Medication Sig Start Date End Date Taking? Authorizing Provider  acetaminophen (TYLENOL) 500 MG tablet Take 1,000 mg by mouth every 6 (six) hours as needed for moderate pain, headache or mild pain.   Yes [provider]  amiodarone (PACERONE) 100 MG tablet TAKE ONE TABLET BY MOUTH EVERY OTHER DAY Patient taking differently: Take 100 mg by mouth every other day. 07/21/21  Yes Camnitz, Will Hassell Done, MD  atorvastatin (LIPITOR) 20 MG tablet TAKE ONE TABLET BY MOUTH ONCE DAILY Patient taking differently: Take 20 mg by mouth daily. 07/21/21  Yes Nahser, Wonda Cheng, MD  carvedilol (COREG) 12.5 MG tablet TAKE ONE TABLET BY MOUTH TWICE DAILY WITH A MEAL Patient taking differently: Take 12.5 mg by mouth 2 (two) times daily with a meal. 07/21/21  Yes Bensimhon, Shaune Pascal, MD  cholecalciferol (VITAMIN D3) 25 MCG (1000 UNIT) tablet Take 2,000 Units by mouth every morning.   Yes [provider]  Cyanocobalamin (VITAMIN B-12) 2500 MCG SUBL Take 2,500 mcg by mouth every other day.   Yes [provider]  diphenhydrAMINE (BENADRYL) 25 mg capsule Take 25 mg by mouth every 6 (six) hours as needed (allergies/sleep).   Yes [provider]  ELIQUIS 2.5 MG TABS tablet TAKE ONE TABLET BY MOUTH TWICE DAILY Patient taking differently: Take 2.5 mg by mouth 2 (two) times daily. 07/21/21  Yes Camnitz, Will Hassell Done, MD  Ensure (ENSURE) Take 237 mLs by mouth daily.   Yes [provider]  hydrALAZINE  (APRESOLINE) 50 MG tablet Take 50 mg by mouth 2 (two) times daily with a meal.   Yes [provider]  ondansetron (ZOFRAN) 4 MG tablet Take 4 mg by mouth every 6 (six) hours as needed for nausea or vomiting.   Yes [provider]  pantoprazole (PROTONIX) 40 MG tablet Take 40 mg by mouth in the morning and at bedtime.   Yes [provider]  Polyethyl Glycol-Propyl Glycol 0.4-0.3 % SOLN Place 1 drop into both eyes every 8 (eight) hours as needed (dryness).   Yes [provider]  polyethylene glycol (MIRALAX / GLYCOLAX) 17 g packet Take 17 g by mouth daily as needed (constipation.).   Yes [provider]  torsemide (DEMADEX) 20 MG tablet Take 20-40 mg by mouth See admin instructions. Take 1 tablet (20 mg) by mouth scheduled every morning & may take 2 tablets (  40 mg) by mouth in the morning if experiencing fluid retention. 11/17/20  Yes [provider]    Allergies    Epoetin alfa, Penicillins, Retacrit [epoetin (alfa)], Crestor [rosuvastatin], Hydrocodone, Influenza vac split quad, Tape, Cefaclor, Crestor [rosuvastatin calcium], Influenza vaccines, Lidocaine hcl, Nsaids, Percocet [oxycodone-acetaminophen], and Prednisone  Review of Systems   Review of Systems  Constitutional:  Negative for activity change and fever.  HENT:  Negative for facial swelling and trouble swallowing.   Eyes:  Negative for discharge and redness.  Respiratory:  Positive for shortness of breath. Negative for cough.   Cardiovascular:  Negative for chest pain and palpitations.  Gastrointestinal:  Negative for abdominal pain and nausea.       Melena   Genitourinary:  Negative for dysuria and flank pain.  Musculoskeletal:  Negative for back pain and gait problem.  Skin:  Negative for pallor and rash.  Neurological:  Positive for weakness. Negative for syncope and headaches.   Physical Exam Updated Vital Signs BP 139/88    Pulse 71    Temp 98 F (36.7 C) (Oral)    Resp 16     SpO2 100%   Physical Exam Vitals and nursing note reviewed.  Constitutional:      General: She is not in acute distress.    Appearance: Normal appearance.  HENT:     Head: Normocephalic and atraumatic.     Right Ear: External ear normal.     Left Ear: External ear normal.     Nose: Nose normal.     Mouth/Throat:     Mouth: Mucous membranes are moist.  Eyes:     General: No scleral icterus.       Right eye: No discharge.        Left eye: No discharge.  Cardiovascular:     Rate and Rhythm: Normal rate and regular rhythm.     Pulses: Normal pulses.     Heart sounds: Normal heart sounds.  Pulmonary:     Effort: Pulmonary effort is normal. No respiratory distress.     Breath sounds: Normal breath sounds. No decreased breath sounds or wheezing.  Abdominal:     General: Abdomen is flat. Bowel sounds are normal.     Palpations: Abdomen is soft.     Tenderness: There is abdominal tenderness in the left lower quadrant.     Comments: Mildly ttp LLQ, not peritoneal  Musculoskeletal:        General: Normal range of motion.     Cervical back: Normal range of motion.     Right lower leg: No edema.     Left lower leg: No edema.  Skin:    General: Skin is warm and dry.     Capillary Refill: Capillary refill takes less than 2 seconds.  Neurological:     Mental Status: She is alert.  Psychiatric:        Mood and Affect: Mood normal.        Behavior: Behavior normal.    ED Results / Procedures / Treatments   Labs (all labs ordered are listed, but only abnormal results are displayed) Labs Reviewed  COMPREHENSIVE METABOLIC PANEL - Abnormal; Notable for the following components:      Result Value   Sodium 132 (*)    Glucose, Bld 224 (*)    BUN 43 (*)    Creatinine, Ser 2.10 (*)    Albumin 3.4 (*)    GFR, Estimated 23 (*)    All other components  within normal limits  CBC WITH DIFFERENTIAL/PLATELET - Abnormal; Notable for the following components:   RBC 2.69 (*)    Hemoglobin  7.2 (*)    HCT 23.2 (*)    RDW 17.9 (*)    All other components within normal limits  BRAIN NATRIURETIC PEPTIDE - Abnormal; Notable for the following components:   B Natriuretic Peptide 268.6 (*)    All other components within normal limits  URINALYSIS, ROUTINE W REFLEX MICROSCOPIC  POC OCCULT BLOOD, ED  TYPE AND SCREEN  PREPARE RBC (CROSSMATCH)    EKG None  Radiology CT ABDOMEN PELVIS WO CONTRAST  Result Date: 10/03/2021 CLINICAL DATA:  Abdominal pain and melena. EXAM: CT ABDOMEN AND PELVIS WITHOUT CONTRAST TECHNIQUE: Multidetector CT imaging of the abdomen and pelvis was performed following the standard protocol without IV contrast. COMPARISON:  CT abdomen pelvis dated Feb 27, 2021. FINDINGS: Lower chest: No acute abnormality. Hepatobiliary: No focal liver abnormality. Unchanged tiny layering gallstones. No gallbladder wall thickening or biliary dilatation. Pancreas: Unremarkable. No pancreatic ductal dilatation or surrounding inflammatory changes. Spleen: Normal in size without focal abnormality. Adrenals/Urinary Tract: Adrenal glands are unremarkable. Bilateral renal cysts. The 4.6 cm cyst arising from the upper pole of the left kidney now demonstrates a tiny high density fluid fluid level (series 3, image 21), consistent with trace internal hemorrhage. Unchanged renovascular calcifications. No renal calculi or hydronephrosis. The bladder is unremarkable. Stomach/Bowel: Unchanged small hiatal hernia. The stomach is otherwise within normal limits. Extensive left-sided colonic diverticulosis again noted. No bowel wall thickening, distention, or surrounding inflammatory changes. Diminutive or absent appendix. Vascular/Lymphatic: Aortic atherosclerosis. No enlarged abdominal or pelvic lymph nodes. Reproductive: Uterus and bilateral adnexa are unremarkable. Other: No free fluid or pneumoperitoneum. Musculoskeletal: No acute or significant osseous findings. Chronic L1 compression deformity again  noted. IMPRESSION: 1. No acute intra-abdominal process. 2. Unchanged cholelithiasis. 3. Aortic Atherosclerosis (ICD10-I70.0). Electronically Signed   By: Titus Dubin M.D.   On: 10/03/2021 14:41   DG Chest 2 View  Result Date: 10/03/2021 CLINICAL DATA:  Shortness of breath EXAM: CHEST - 2 VIEW COMPARISON:  04/14/2021 FINDINGS: Transverse diameter of heart is increased. Thoracic aorta is tortuous. Calcified lymph nodes are seen in the mediastinum. There are no signs of pulmonary edema or focal pulmonary consolidation. There is no significant pleural effusion or pneumothorax. There scattered blebs in the apices. Pacemaker battery is seen in the left infraclavicular region with biventricular leads in place. IMPRESSION: Cardiomegaly. There are no signs of pulmonary edema or focal pulmonary consolidation. Electronically Signed   By: Elmer Picker M.D.   On: 10/03/2021 11:52    Procedures .Critical Care Performed by: Jeanell Sparrow, DO Authorized by: Jeanell Sparrow, DO   Critical care provider statement:    Critical care time (minutes):  30   Critical care was necessary to treat or prevent imminent or life-threatening deterioration of the following conditions:  Circulatory failure   Critical care was time spent personally by me on the following activities:  Development of treatment plan with patient or surrogate, discussions with consultants, evaluation of patient's response to treatment, examination of patient, ordering and review of laboratory studies, ordering and review of radiographic studies, ordering and performing treatments and interventions, pulse oximetry, re-evaluation of patient's condition and review of old charts Comments:     Symptomatic anemia requiring PRBC transfusion    Medications Ordered in ED Medications  0.9 %  sodium chloride infusion (has no administration in time range)  torsemide (DEMADEX) tablet 20 mg (  has no administration in time range)  pantoprazole  (PROTONIX) injection 40 mg (40 mg Intravenous Given 10/03/21 1303)    ED Course  I have reviewed the triage vital signs and the nursing notes.  Pertinent labs & imaging results that were available during my care of the patient were reviewed by me and considered in my medical decision making (see chart for details).    MDM Rules/Calculators/A&P                            CC: abnormal labs, weakness/dib  This patient complains of above; this involves an extensive number of treatment options and is a complaint that carries with it a high risk of complications and morbidity. Vital signs were reviewed. Serious etiologies considered.  Record review:   Previous records obtained and reviewed   Work up as above, notable for:  Lab results that were available during my care of the patient were reviewed by me and considered in my medical decision making.   Management: Give protonix, consented for 1uPRBC.  Discussed with [facility] gastroenterology who is agreeable to plan.  Recommend holding blood thinner until patient can receive her endoscopy tomorrow for presumed UGI bleed.   At this time plan for blood transfusion, will give torsemide after transfusion.  Advised patient to hold her Eliquis until she is seen by GI tomorrow.  Plan to re-assess once transfusion is complete and if pt feeling better would recommend discharge to f/u with GI tomorrow morning.   Care signed out to incoming physician Dr. Regenia Skeeter pending completion of PRBC's       This chart was dictated using voice recognition software.  Despite best efforts to proofread,  errors can occur which can change the documentation meaning.  Final Clinical Impression(s) / ED Diagnoses Final diagnoses:  Gastrointestinal hemorrhage, unspecified gastrointestinal hemorrhage type  Symptomatic anemia    Rx / DC Orders ED Discharge Orders     None        Jeanell Sparrow, DO 10/03/21 1528

## 2021-10-03 NOTE — ED Provider Notes (Signed)
Emergency Medicine Provider Triage Evaluation Note  Krista Mason , a 85 y.o. female  was evaluated in triage.  Patient presents with anemia.  She was at the hospital receiving iron infusion and was told to come to the emergency department with a hemoglobin of 6.6.  Over the past month, she has had generalized weakness and shortness of breath that is progressively gotten worse.  She is also states that she has had black tarry stools over the past week, and is scheduled for endoscopy tomorrow. She has a history of esophageal bleeding so that is why they are doing the endoscopy.   Review of Systems  Positive:  Negative:   Physical Exam  BP (!) 175/55    Pulse 69    Temp 98.3 F (36.8 C) (Oral)    Resp 16    SpO2 100%  Gen:   Awake, no distress   Resp:  Normal effort  MSK:   Moves extremities without difficulty  Other:    Medical Decision Making  Medically screening exam initiated at 11:23 AM.  Appropriate orders placed.  Krista Mason was informed that the remainder of the evaluation will be completed by another provider, this initial triage assessment does not replace that evaluation, and the importance of remaining in the ED until their evaluation is complete.     Sheila Oats 10/03/21 1128    Daleen Bo, MD 10/03/21 1536

## 2021-10-03 NOTE — ED Provider Notes (Addendum)
Patient tolerated blood transfusion well.  She has been a little hypertensive here but otherwise vitals are stable.  She appears stable for discharge with GI follow-up tomorrow as per prior plan with Dr. Pearline Cables.   Sherwood Gambler, MD 10/03/21 1647  UA also resulted and is overall unremarkable, not c/w UTI   Sherwood Gambler, MD 10/03/21 [phone]

## 2021-10-03 NOTE — Progress Notes (Addendum)
Hemocue 6.6 today.  Pt reports tarry stools since august, endo tomorrow with [facility], sob, leg pain, weak and tired.  Reported the above to [employer] at [facility], awaiting orders.  Amber called back.  Dr Hollie Salk not there today so she spoke with Dr Moshe Cipro who stated the patient needs to get blood and she can choose to go to the ER today or schedule it as an outpatient.  The patient stated she felt lousy so she would go to the ER today.  Also made pt aware that she needs to let the ER MD know she goes to [facility] and has an endo in the morning so they can coordinate that care.  Pt verbalized understanding and daughter Davy Pique was updated.  Davy Pique stated she has a root canal today so to contact Delfino Lovett (pts son) if they need to speak to family and to arrange pick up from the ER.

## 2021-10-04 ENCOUNTER — Other Ambulatory Visit: Payer: Self-pay | Admitting: Gastroenterology

## 2021-10-04 LAB — TYPE AND SCREEN
ABO/RH(D): A POS
Antibody Screen: NEGATIVE
Unit division: 0

## 2021-10-04 LAB — BPAM RBC
Blood Product Expiration Date: 202212262359
ISSUE DATE / TIME: 202212131335
Unit Type and Rh: 6200

## 2021-10-09 DIAGNOSIS — E785 Hyperlipidemia, unspecified: Secondary | ICD-10-CM | POA: Diagnosis not present

## 2021-10-09 DIAGNOSIS — I10 Essential (primary) hypertension: Secondary | ICD-10-CM | POA: Diagnosis not present

## 2021-10-09 DIAGNOSIS — N184 Chronic kidney disease, stage 4 (severe): Secondary | ICD-10-CM | POA: Diagnosis not present

## 2021-10-09 DIAGNOSIS — I4891 Unspecified atrial fibrillation: Secondary | ICD-10-CM | POA: Diagnosis not present

## 2021-10-09 DIAGNOSIS — E119 Type 2 diabetes mellitus without complications: Secondary | ICD-10-CM | POA: Diagnosis not present

## 2021-10-09 DIAGNOSIS — I5040 Unspecified combined systolic (congestive) and diastolic (congestive) heart failure: Secondary | ICD-10-CM | POA: Diagnosis not present

## 2021-10-09 DIAGNOSIS — J449 Chronic obstructive pulmonary disease, unspecified: Secondary | ICD-10-CM | POA: Diagnosis not present

## 2021-10-09 NOTE — Anesthesia Preprocedure Evaluation (Addendum)
Anesthesia Evaluation  Patient identified by MRN, date of birth, ID band Patient awake    Reviewed: Allergy & Precautions, NPO status , Patient's Chart, lab work & pertinent test results, reviewed documented beta blocker date and time   History of Anesthesia Complications Negative for: history of anesthetic complications  Airway Mallampati: II  TM Distance: >3 FB Neck ROM: Full    Dental  (+) Lower Dentures, Upper Dentures   Pulmonary COPD, former smoker,    Pulmonary exam normal        Cardiovascular hypertension, Pt. on medications and Pt. on home beta blockers + Peripheral Vascular Disease and +CHF  Normal cardiovascular exam+ dysrhythmias (on Eliquis) Atrial Fibrillation + pacemaker   TTE 12/2019: EF 45-50%, global hypokinesis, mild LVH, mild to moderate MR, mild MS, mild AR     Neuro/Psych negative neurological ROS  negative psych ROS   GI/Hepatic Neg liver ROS, GERD  Medicated and Controlled,Esophageal stricture/esophagitis   Endo/Other  diabetes, Type 2  Renal/GU Renal InsufficiencyRenal disease  negative genitourinary   Musculoskeletal negative musculoskeletal ROS (+)   Abdominal   Peds  Hematology  (+) anemia ,   Anesthesia Other Findings Day of surgery medications reviewed with patient.  Reproductive/Obstetrics negative OB ROS                           Anesthesia Physical Anesthesia Plan  ASA: 3  Anesthesia Plan: MAC   Post-op Pain Management: Minimal or no pain anticipated   Induction:   PONV Risk Score and Plan: 2 and Treatment may vary due to age or medical condition and Propofol infusion  Airway Management Planned: Natural Airway and Nasal Cannula  Additional Equipment: None  Intra-op Plan:   Post-operative Plan:   Informed Consent: I have reviewed the patients History and Physical, chart, labs and discussed the procedure including the risks, benefits and  alternatives for the proposed anesthesia with the patient or authorized representative who has indicated his/her understanding and acceptance.       Plan Discussed with: CRNA  Anesthesia Plan Comments:        Anesthesia Quick Evaluation

## 2021-10-10 ENCOUNTER — Ambulatory Visit (HOSPITAL_COMMUNITY): Payer: Medicare HMO | Admitting: Certified Registered Nurse Anesthetist

## 2021-10-10 ENCOUNTER — Encounter (HOSPITAL_COMMUNITY)
Admission: RE | Disposition: A | Discharge status: Home / Self Care | Payer: Self-pay | Source: Home / Self Care | Attending: Gastroenterology

## 2021-10-10 ENCOUNTER — Other Ambulatory Visit: Payer: Self-pay

## 2021-10-10 ENCOUNTER — Encounter (HOSPITAL_COMMUNITY): Payer: Medicare HMO

## 2021-10-10 ENCOUNTER — Ambulatory Visit (HOSPITAL_COMMUNITY)
Admission: RE | Admit: 2021-10-10 | Discharge: 2021-10-10 | Disposition: A | Discharge status: Home / Self Care | Payer: Medicare HMO | Attending: Gastroenterology | Admitting: Gastroenterology

## 2021-10-10 ENCOUNTER — Encounter (HOSPITAL_COMMUNITY): Payer: Self-pay | Admitting: Gastroenterology

## 2021-10-10 DIAGNOSIS — K449 Diaphragmatic hernia without obstruction or gangrene: Secondary | ICD-10-CM | POA: Insufficient documentation

## 2021-10-10 DIAGNOSIS — N189 Chronic kidney disease, unspecified: Secondary | ICD-10-CM | POA: Insufficient documentation

## 2021-10-10 DIAGNOSIS — K2100 Gastro-esophageal reflux disease with esophagitis, without bleeding: Secondary | ICD-10-CM | POA: Insufficient documentation

## 2021-10-10 DIAGNOSIS — I5042 Chronic combined systolic (congestive) and diastolic (congestive) heart failure: Secondary | ICD-10-CM | POA: Diagnosis not present

## 2021-10-10 DIAGNOSIS — K2211 Ulcer of esophagus with bleeding: Secondary | ICD-10-CM | POA: Diagnosis not present

## 2021-10-10 DIAGNOSIS — R1310 Dysphagia, unspecified: Secondary | ICD-10-CM | POA: Insufficient documentation

## 2021-10-10 DIAGNOSIS — Z7901 Long term (current) use of anticoagulants: Secondary | ICD-10-CM | POA: Insufficient documentation

## 2021-10-10 DIAGNOSIS — K2210 Ulcer of esophagus without bleeding: Secondary | ICD-10-CM | POA: Diagnosis not present

## 2021-10-10 DIAGNOSIS — Z87891 Personal history of nicotine dependence: Secondary | ICD-10-CM | POA: Insufficient documentation

## 2021-10-10 DIAGNOSIS — E1122 Type 2 diabetes mellitus with diabetic chronic kidney disease: Secondary | ICD-10-CM | POA: Insufficient documentation

## 2021-10-10 DIAGNOSIS — K222 Esophageal obstruction: Secondary | ICD-10-CM | POA: Insufficient documentation

## 2021-10-10 DIAGNOSIS — K921 Melena: Secondary | ICD-10-CM | POA: Insufficient documentation

## 2021-10-10 DIAGNOSIS — K2101 Gastro-esophageal reflux disease with esophagitis, with bleeding: Secondary | ICD-10-CM | POA: Diagnosis not present

## 2021-10-10 DIAGNOSIS — I130 Hypertensive heart and chronic kidney disease with heart failure and stage 1 through stage 4 chronic kidney disease, or unspecified chronic kidney disease: Secondary | ICD-10-CM | POA: Insufficient documentation

## 2021-10-10 DIAGNOSIS — I4891 Unspecified atrial fibrillation: Secondary | ICD-10-CM | POA: Diagnosis not present

## 2021-10-10 DIAGNOSIS — K209 Esophagitis, unspecified without bleeding: Secondary | ICD-10-CM | POA: Diagnosis not present

## 2021-10-10 HISTORY — PX: ESOPHAGOGASTRODUODENOSCOPY (EGD) WITH PROPOFOL: SHX5813

## 2021-10-10 LAB — GLUCOSE, CAPILLARY: Glucose-Capillary: 154 mg/dL — ABNORMAL HIGH (ref 70–99)

## 2021-10-10 SURGERY — ESOPHAGOGASTRODUODENOSCOPY (EGD) WITH PROPOFOL
Anesthesia: Monitor Anesthesia Care

## 2021-10-10 MED ORDER — PROPOFOL 500 MG/50ML IV EMUL
INTRAVENOUS | Status: AC
  Filled 2021-10-10: qty 50

## 2021-10-10 MED ORDER — LIDOCAINE 2% (20 MG/ML) 5 ML SYRINGE
INTRAMUSCULAR | Status: DC | PRN
  Administered 2021-10-10: 60 mg via INTRAVENOUS

## 2021-10-10 MED ORDER — SODIUM CHLORIDE 0.9 % IV SOLN: INTRAVENOUS | Status: DC

## 2021-10-10 MED ORDER — SUCRALFATE 1 GM/10ML PO SUSP: 1.0000 g | Freq: Four times a day (QID) | ORAL | 0 refills | Status: DC

## 2021-10-10 MED ORDER — PHENYLEPHRINE HCL (PRESSORS) 10 MG/ML IV SOLN
INTRAVENOUS | Status: AC
  Filled 2021-10-10: qty 1

## 2021-10-10 MED ORDER — PROPOFOL 500 MG/50ML IV EMUL
INTRAVENOUS | Status: DC | PRN
  Administered 2021-10-10: 90 ug/kg/min via INTRAVENOUS

## 2021-10-10 MED ORDER — LACTATED RINGERS IV SOLN
INTRAVENOUS | Status: AC | PRN
  Administered 2021-10-10: 1000 mL via INTRAVENOUS

## 2021-10-10 MED ORDER — ONDANSETRON HCL 4 MG/2ML IJ SOLN
INTRAMUSCULAR | Status: DC | PRN
  Administered 2021-10-10: 4 mg via INTRAVENOUS

## 2021-10-10 MED ORDER — PROPOFOL 10 MG/ML IV BOLUS
INTRAVENOUS | Status: AC
  Filled 2021-10-10: qty 20

## 2021-10-10 SURGICAL SUPPLY — 15 items

## 2021-10-10 NOTE — Anesthesia Procedure Notes (Signed)
Procedure Name: MAC Date/Time: 10/10/2021 7:45 AM Performed by: West Pugh, CRNA Pre-anesthesia Checklist: Patient identified, Emergency Drugs available, Suction available, Patient being monitored and Timeout performed Patient Re-evaluated:Patient Re-evaluated prior to induction Oxygen Delivery Method: Simple face mask Preoxygenation: Pre-oxygenation with 100% oxygen Placement Confirmation: positive ETCO2 Dental Injury: Teeth and Oropharynx as per pre-operative assessment

## 2021-10-10 NOTE — Transfer of Care (Signed)
Immediate Anesthesia Transfer of Care Note  Patient: Krista Mason  Procedure(s) Performed: ESOPHAGOGASTRODUODENOSCOPY (EGD) WITH PROPOFOL  Patient Location: PACU and Endoscopy Unit  Anesthesia Type:MAC  Level of Consciousness: awake, alert , oriented and patient cooperative  Airway & Oxygen Therapy: Patient Spontanous Breathing and Patient connected to nasal cannula oxygen  Post-op Assessment: Report given to RN and Post -op Vital signs reviewed and stable  Post vital signs: Reviewed and stable  Last Vitals:  Vitals Value Taken Time  BP 119/56 10/10/21 0807  Temp    Pulse 70 10/10/21 0808  Resp 16 10/10/21 0808  SpO2 100 % 10/10/21 0808  Vitals shown include unvalidated device data.  Last Pain:  Vitals:   10/10/21 0655  TempSrc: Oral  PainSc: 0-No pain         Complications: No notable events documented.

## 2021-10-10 NOTE — H&P (Signed)
Primary Care Physician:  [facility] [place] [facility] Gastroenterologist: [facility]  Reason for Consultation: Melena, anemia, dysphagia, history of esophagitis  HPI: Krista Mason is a 85 y.o. female with past medical history of atrial fibrillation on Eliquis, history of esophagitis and esophageal stricture, history of anemia is here today for outpatient EGD with possible dilation.  Underwent multiple EGD recently.  Last EGD in August 2022 again showed distal esophageal ulcer with esophageal stricture.  Showed some benign hyperplastic gastric polyps.  Seen in the emergency room recently for melena and drop in hemoglobin to 6.6.  She has received blood transfusion.  Hemoglobin improved to 7.2.  She denies any further melena.  She resumed taking anticoagulation but hold it 2 days prior to today.    Past Medical History:  Diagnosis Date   Anemia    Atrial fibrillation (HCC)    Chronic combined systolic and diastolic CHF (congestive heart failure) ([place])    a. LV dysfcuntion felt to be related to PVCs   Chronic kidney disease    Diabetes mellitus without complication (HCC)    GERD (gastroesophageal reflux disease)    Hx of echocardiogram    Echo (1/16):  EF 60-65%, no RWMA, Gr 1 DD, mod AI, mod MR, mild LAE   Hyperkalemia    Hyperlipidemia    Hypertension    NICM (nonischemic cardiomyopathy) ([place])    a. 12/2018 Echo: EF 35%, diff HK w/ septal-lateral dyssynchrony. Nl RV fxn; b. 07/2019 s/p MDT [id number] [name] CRT-P MRI SureScan device (ser #: [id number])   Orthostatic hypotension    PVC (premature ventricular contraction)    a. s/p PVC ablation in 11/2016   Rheumatic fever    Wears dentures     Past Surgical History:  Procedure Laterality Date   BALLOON DILATION N/A 07/12/2020   Procedure: BALLOON DILATION;  Surgeon: Ronald Lobo, MD;  Location: [facility];  Service: Endoscopy;  Laterality: N/A;   BIOPSY  08/20/2019   Procedure: BIOPSY;  Surgeon:  Otis Brace, MD;  Location: [facility];  Service: Gastroenterology;;   BIV PACEMAKER INSERTION CRT-P N/A 07/24/2019   Procedure: BIV PACEMAKER INSERTION CRT-P;  Surgeon: Constance Haw, MD;  Location: [place] [facility];  Service: Cardiovascular;  Laterality: N/A;   COLONOSCOPY WITH PROPOFOL N/A 08/22/2019   Procedure: COLONOSCOPY WITH PROPOFOL;  Surgeon: Laurence Spates, MD;  Location: [place];  Service: Endoscopy;  Laterality: N/A;   ESOPHAGOGASTRODUODENOSCOPY N/A 02/28/2021   Procedure: ESOPHAGOGASTRODUODENOSCOPY (EGD);  Surgeon: Wilford Corner, MD;  Location: [place];  Service: Endoscopy;  Laterality: N/A;   ESOPHAGOGASTRODUODENOSCOPY (EGD) WITH PROPOFOL N/A 09/25/2018   Procedure: ESOPHAGOGASTRODUODENOSCOPY (EGD) WITH PROPOFOL;  Surgeon: Ronald Lobo, MD;  [facility]: [facility];  Service: Endoscopy;  Laterality: N/A;   ESOPHAGOGASTRODUODENOSCOPY (EGD) WITH PROPOFOL N/A 10/29/2018   Procedure: ESOPHAGOGASTRODUODENOSCOPY (EGD) WITH PROPOFOL;  Surgeon: Ronald Lobo, MD;  [facility];  Service: Endoscopy;  Laterality: N/A;   ESOPHAGOGASTRODUODENOSCOPY (EGD) WITH PROPOFOL N/A 08/20/2019   Procedure: ESOPHAGOGASTRODUODENOSCOPY (EGD) WITH PROPOFOL;  Surgeon: Otis Brace, MD;  Location: [place];  Service: Gastroenterology;  Laterality: N/A;   ESOPHAGOGASTRODUODENOSCOPY (EGD) WITH PROPOFOL N/A 05/03/2020   Procedure: ESOPHAGOGASTRODUODENOSCOPY (EGD) WITH PROPOFOL;  Surgeon: Ronald Lobo, MD;  [facility];  Service: Endoscopy;  Laterality: N/A;   ESOPHAGOGASTRODUODENOSCOPY (EGD) WITH PROPOFOL N/A 06/02/2020   Procedure: ESOPHAGOGASTRODUODENOSCOPY (EGD) WITH PROPOFOL and Savory Dilatation;  Surgeon: Clarene Essex, MD;  [facility];  Service: Gastroenterology;  Laterality: N/A;   ESOPHAGOGASTRODUODENOSCOPY (EGD) WITH  PROPOFOL N/A 07/12/2020   Procedure: ESOPHAGOGASTRODUODENOSCOPY (EGD) WITH PROPOFOL with dilatation;  Surgeon: Ronald Lobo, MD;  [facility];  Service: Endoscopy;  Laterality: N/A;   ESOPHAGOGASTRODUODENOSCOPY (EGD) WITH PROPOFOL N/A 05/31/2021   Procedure: ESOPHAGOGASTRODUODENOSCOPY (EGD) WITH PROPOFOL;  Surgeon: Otis Brace, MD;  [facility];  Service: Gastroenterology;  Laterality: N/A;   HEMOSTASIS CLIP PLACEMENT  08/22/2019   Procedure: HEMOSTASIS CLIP PLACEMENT;  Surgeon: Laurence Spates, MD;  Location: [facility];  Service: Endoscopy;;   KNEE SURGERY Left 2009   OVARIAN CYST REMOVAL     POLYPECTOMY  08/22/2019   Procedure: POLYPECTOMY;  Surgeon: Laurence Spates, MD;  Location: [place];  Service: Endoscopy;;   POLYPECTOMY  05/31/2021   Procedure: POLYPECTOMY;  Surgeon: Otis Brace, MD;  [facility]: [facility];  Service: Gastroenterology;;   PVC ABLATION N/A 11/30/2016   Procedure: PVC Ablation;  Surgeon: Will [name], MD;  Location: [place] [facility];  Service: Cardiovascular;  Laterality: N/A;   RIGHT HEART CATH N/A 01/31/2018   Procedure: RIGHT HEART CATH;  Surgeon: Jolaine Artist, MD;  Location: [place] [facility];  Service: Cardiovascular;  Laterality: N/A;   RIGHT/LEFT HEART CATH AND CORONARY ANGIOGRAPHY N/A 01/25/2017   Procedure: Right/Left Heart Cath and Coronary Angiography;  Surgeon: Jolaine Artist, MD;  Location: [place] [facility];  Service: Cardiovascular;  Laterality: N/A;   SAVORY DILATION N/A 09/25/2018   Procedure: SAVORY DILATION;  Surgeon: Ronald Lobo, MD;  [facility];  Service: Endoscopy;  Laterality: N/A;   SAVORY DILATION N/A 10/29/2018   Procedure: SAVORY DILATION;  Surgeon: Ronald Lobo, MD;  [facility];  Service: Endoscopy;  Laterality: N/A;   SAVORY DILATION N/A 08/20/2019   Procedure: SAVORY DILATION;  Surgeon: Otis Brace, MD;  Location: [facility];  Service: Gastroenterology;  Laterality: N/A;   SAVORY DILATION N/A 05/03/2020   Procedure: SAVORY DILATION;  Surgeon: Ronald Lobo, MD;   [facility];  Service: Endoscopy;  Laterality: N/A;  Patient needs ultraslim pediatric upper endoscope/     no fluoro per dr. Cristal Deer DILATION N/A 06/02/2020   Procedure: SAVORY DILATION w/ Burtis Junes;  Surgeon: Clarene Essex, MD;  [facility];  Service: Gastroenterology;  Laterality: N/A;    Prior to Admission medications   Medication Sig Start Date End Date Taking? Authorizing Provider  acetaminophen (TYLENOL) 500 MG tablet Take 1,000 mg by mouth every 6 (six) hours as needed for moderate pain, headache or mild pain.   Yes [provider]  amiodarone (PACERONE) 100 MG tablet TAKE ONE TABLET BY MOUTH EVERY OTHER DAY Patient taking differently: Take 100 mg by mouth every other day. 07/21/21  Yes Camnitz, Will Hassell Done, MD  atorvastatin (LIPITOR) 20 MG tablet TAKE ONE TABLET BY MOUTH ONCE DAILY Patient taking differently: Take 20 mg by mouth daily. 07/21/21  Yes Nahser, Wonda Cheng, MD  carvedilol (COREG) 12.5 MG tablet TAKE ONE TABLET BY MOUTH TWICE DAILY WITH A MEAL Patient taking differently: Take 12.5 mg by mouth 2 (two) times daily with a meal. 07/21/21  Yes Bensimhon, Shaune Pascal, MD  cholecalciferol (VITAMIN D3) 25 MCG (1000 UNIT) tablet Take 2,000 Units by mouth every morning.   Yes [provider]  Cyanocobalamin (VITAMIN B-12) 2500 MCG SUBL Take 2,500 mcg by mouth every other day.   Yes [provider]  hydrALAZINE (APRESOLINE) 50 MG tablet Take 50 mg by mouth 2 (two) times daily with a meal.   Yes [provider]  ondansetron (  ZOFRAN) 4 MG tablet Take 4 mg by mouth every 6 (six) hours as needed for nausea or vomiting.   Yes [provider]  pantoprazole (PROTONIX) 40 MG tablet Take 40 mg by mouth in the morning and at bedtime.   Yes [provider]  polyethylene glycol (MIRALAX / GLYCOLAX) 17 g packet Take 17 g by mouth daily as needed (constipation.).   Yes [provider]  torsemide (DEMADEX) 20 MG tablet  Take 20-40 mg by mouth See admin instructions. Take 1 tablet (20 mg) by mouth scheduled every morning & may take 2 tablets (40 mg) by mouth in the morning if experiencing fluid retention. 11/17/20  Yes [provider]  diphenhydrAMINE (BENADRYL) 25 mg capsule Take 25 mg by mouth every 6 (six) hours as needed (allergies/sleep).    [provider]  ELIQUIS 2.5 MG TABS tablet TAKE ONE TABLET BY MOUTH TWICE DAILY Patient taking differently: Take 2.5 mg by mouth 2 (two) times daily. 07/21/21   Camnitz, Will Hassell Done, MD  Ensure (ENSURE) Take 237 mLs by mouth daily.    [provider]  Polyethyl Glycol-Propyl Glycol 0.4-0.3 % SOLN Place 1 drop into both eyes every 8 (eight) hours as needed (dryness).    [provider]    Scheduled Meds: Continuous Infusions:  sodium chloride     lactated ringers     PRN Meds:.lactated ringers  Allergies as of 10/04/2021 - Review Complete 10/03/2021  Allergen Reaction Noted   Epoetin alfa Nausea Only and Other (See Comments) 12/08/2019   Penicillins Itching and Rash 12/15/2012   Retacrit [epoetin (alfa)] Other (See Comments) 12/08/2019   Crestor [rosuvastatin] Nausea Only 02/22/2021   Hydrocodone Itching and Other (See Comments) 02/22/2021   Influenza vac split quad Swelling and Other (See Comments) 02/22/2021   Tape Other (See Comments) 02/27/2021   Cefaclor Itching 02/28/2012   Crestor [rosuvastatin calcium] Nausea Only 10/23/2016   Influenza vaccines Swelling and Other (See Comments) 09/17/2018   Lidocaine hcl Palpitations and Other (See Comments) 10/23/2016   Nsaids  12/15/2012   Percocet [oxycodone-acetaminophen] Nausea And Vomiting 02/28/2012   Prednisone Other (See Comments) 12/15/2012    Family History  Problem Relation Age of Onset   Heart attack Father    Heart disease Father    Cancer Father    Hypertension Father    Hypertension Brother    Stroke Brother    Hypertension Brother    Diabetes Son     Hyperlipidemia Son    Hypertension Son     Social History   Socioeconomic History   Marital status: Widowed    Spouse name: Not on file   Number of children: Not on file   Years of education: Not on file   Highest education level: Not on file  Occupational History   Not on file  Tobacco Use   Smoking status: Former    Packs/day: 0.75    Years: 60.00    Pack years: 45.00    Types: Cigarettes    Quit date: 11/04/2016    Years since quitting: 4.9   Smokeless tobacco: Never  Vaping Use   Vaping Use: Never used  Substance and Sexual Activity   Alcohol use: No   Drug use: No   Sexual activity: Not on file  Other Topics Concern   Not on file  Social History Narrative   Lives at [facility].   Social Determinants of Health   Financial Resource Strain: Not on file  Food Insecurity:  Not on file  Transportation Needs: Not on file  Physical Activity: Not on file  Stress: Not on file  Social Connections: Not on file  Intimate Partner Violence: Not on file    Review of Systems: All negative except as stated above in HPI.  Physical Exam: Vital signs: Vitals:   10/10/21 0655  BP: (!) 145/50  Pulse: 70  Resp: 12  Temp: 97.8 F (36.6 C)  SpO2: 100%     General:   Alert,  Well-developed, well-nourished, pleasant and cooperative in NAD Lungs:  Clear throughout to auscultation.   No wheezes, crackles, or rhonchi. No acute distress. Heart: Irregularly irregular,  abdomen: Soft, nontender, nondistended, bowel sounds present Rectal:  Deferred  GI:  Lab Results: No results for input(s): WBC, HGB, HCT, PLT in the last 72 hours. BMET No results for input(s): NA, K, CL, CO2, GLUCOSE, BUN, CREATININE, CALCIUM in the last 72 hours. LFT No results for input(s): PROT, ALBUMIN, AST, ALT, ALKPHOS, BILITOT, BILIDIR, IBILI in the last 72 hours. PT/INR No results for input(s): LABPROT, INR in the last 72 hours.   Studies/Results: No results  found.  Impression/Plan: -Melena and dysphagia in setting of esophagitis and esophageal ulcer. -Atrial fibrillation -Eliquis on hold for last 2 -History of CHF.  Echocardiogram in March 2021 showed EF of 45 to 50%.  Recommendations ------------------------- -Proceed with EGD with possible dilation  Risks (bleeding, infection, bowel perforation that could require surgery, sedation-related changes in cardiopulmonary systems), benefits (identification and possible treatment of source of symptoms, exclusion of certain causes of symptoms), and alternatives (watchful waiting, radiographic imaging studies, empiric medical treatment)  were explained to patient/family in detail and patient wishes to proceed.     LOS: 0 days   Otis Brace  MD, FACP 10/10/2021, 7:44 AM  Contact #  [phone]

## 2021-10-10 NOTE — Anesthesia Postprocedure Evaluation (Signed)
Anesthesia Post Note  Patient: Brinklee C Wrage  Procedure(s) Performed: ESOPHAGOGASTRODUODENOSCOPY (EGD) WITH PROPOFOL     Patient location during evaluation: PACU Anesthesia Type: MAC Level of consciousness: awake and alert and oriented Pain management: pain level controlled Vital Signs Assessment: post-procedure vital signs reviewed and stable Respiratory status: spontaneous breathing, nonlabored ventilation and respiratory function stable Cardiovascular status: blood pressure returned to baseline Postop Assessment: no apparent nausea or vomiting Anesthetic complications: no   No notable events documented.  Last Vitals:  Vitals:   10/10/21 0810 10/10/21 0820  BP: (!) 118/35 134/68  Pulse: 69 70  Resp: (!) 24 13  Temp: (!) 35.8 C   SpO2: 100% 100%    Last Pain:  Vitals:   10/10/21 0810  TempSrc: Axillary  PainSc:                  Marthenia Rolling

## 2021-10-10 NOTE — Op Note (Signed)
[facility] Patient Name: Krista Mason Procedure Date: 10/10/2021 MRN: [id number] Attending MD: Otis Brace , MD Date of Birth: 1935-10-23 CSN: [id number] Age: 85 Admit Type: Outpatient Procedure:                Upper GI endoscopy Indications:              Dysphagia, Melena, Follow-up of esophagitis Providers:                Otis Brace, MD, Doristine Johns, RN, Lodema Hong Technician, Technician Referring MD:              Medicines:                Sedation Administered by an Anesthesia Professional Complications:            No immediate complications. Estimated Blood Loss:     Estimated blood loss was minimal. Procedure:                Pre-Anesthesia Assessment:                           - Prior to the procedure, a History and Physical                            was performed, and patient medications and                            allergies were reviewed. The patient's tolerance of                            previous anesthesia was also reviewed. The risks                            and benefits of the procedure and the sedation                            options and risks were discussed with the patient.                            All questions were answered, and informed consent                            was obtained. Prior Anticoagulants: The patient has                            taken Eliquis (apixaban), last dose was 2 days                            prior to procedure. ASA Grade Assessment: III - A                            patient with severe systemic disease. After  reviewing the risks and benefits, the patient was                            deemed in satisfactory condition to undergo the                            procedure.                           After obtaining informed consent, the endoscope was                            passed under direct vision. Throughout the                             procedure, the patient's blood pressure, pulse, and                            oxygen saturations were monitored continuously. The                            [id number]-[id number] ([id number]) Olympus endoscope was introduced                            through the mouth, and advanced to the second part                            of duodenum. The upper GI endoscopy was                            accomplished without difficulty. The patient                            tolerated the procedure well. Scope In: Scope Out: Findings:      One superficial esophageal ulcer was found in the distal esophagus. The       lesion was 20 mm in largest dimension.      One benign-appearing, intrinsic moderate (circumferential scarring or       stenosis; an endoscope may pass) stenosis was found in the distal       esophagus. The stenosis was traversed with mild resistance. She was       found to have significant oozing of blood with just advancement of scope       from this area. Hence dilation was not performed.      A medium-sized hiatal hernia was present.      Normal mucosa was found in the entire examined stomach.      The cardia and gastric fundus were normal on retroflexion.      The duodenal bulb, first portion of the duodenum and second portion of       the duodenum were normal. Impression:               - Esophageal ulcer.                           - Benign-appearing esophageal stenosis.                           -  Medium-sized hiatal hernia.                           - Normal mucosa was found in the entire stomach.                           - Normal duodenal bulb, first portion of the                            duodenum and second portion of the duodenum.                           - No specimens collected. Moderate Sedation:      Moderate (conscious) sedation was personally administered by an       anesthesia professional. The following parameters were monitored: oxygen       saturation, heart rate,  blood pressure, and response to care. Recommendation:           - Patient has a contact number available for                            emergencies. The signs and symptoms of potential                            delayed complications were discussed with the                            patient. Return to normal activities tomorrow.                            Written discharge instructions were provided to the                            patient.                           - Resume previous diet.                           - Continue present medications.                           - Resume Eliquis (apixaban) at prior dose in 2 days.                           - Use sucralfate suspension 1 gram PO QID.                           - Repeat upper endoscopy in 2 months to check                            healing. Consider holding Eliquis for more than 2                            days prior to next EGD. Procedure Code(s):        ---  Professional ---                           [phone], Esophagogastroduodenoscopy, flexible,                            transoral; diagnostic, including collection of                            specimen(s) by brushing or washing, when performed                            (separate procedure) Diagnosis Code(s):        --- Professional ---                           K22.10, Ulcer of esophagus without bleeding                           K22.2, Esophageal obstruction                           K44.9, Diaphragmatic hernia without obstruction or                            gangrene                           R13.10, Dysphagia, unspecified                           K92.1, Melena (includes Hematochezia)                           K20.90, Esophagitis, unspecified without bleeding CPT copyright 2019 American Medical Association. All rights reserved. The codes documented in this report are preliminary and upon coder review may  be revised to meet current compliance requirements. Otis Brace, MD Otis Brace, MD 10/10/2021 8:09:41 AM Number of Addenda: 0

## 2021-10-11 ENCOUNTER — Encounter (HOSPITAL_COMMUNITY): Payer: Self-pay | Admitting: Gastroenterology

## 2021-10-12 ENCOUNTER — Other Ambulatory Visit: Payer: Self-pay

## 2021-10-12 ENCOUNTER — Ambulatory Visit (HOSPITAL_COMMUNITY)
Admission: RE | Admit: 2021-10-12 | Discharge: 2021-10-12 | Disposition: A | Discharge status: Home / Self Care | Payer: Medicare HMO | Source: Ambulatory Visit | Attending: Internal Medicine | Admitting: Internal Medicine

## 2021-10-12 ENCOUNTER — Ambulatory Visit (HOSPITAL_BASED_OUTPATIENT_CLINIC_OR_DEPARTMENT_OTHER)
Admission: RE | Admit: 2021-10-12 | Discharge: 2021-10-12 | Disposition: A | Discharge status: Home / Self Care | Payer: Medicare HMO | Source: Ambulatory Visit | Attending: Internal Medicine | Admitting: Internal Medicine

## 2021-10-12 ENCOUNTER — Encounter (HOSPITAL_COMMUNITY): Payer: Self-pay | Admitting: Internal Medicine

## 2021-10-12 VITALS — BP 124/70 | HR 70 | Wt 147.6 lb

## 2021-10-12 DIAGNOSIS — I428 Other cardiomyopathies: Secondary | ICD-10-CM | POA: Insufficient documentation

## 2021-10-12 DIAGNOSIS — I493 Ventricular premature depolarization: Secondary | ICD-10-CM | POA: Diagnosis not present

## 2021-10-12 DIAGNOSIS — E785 Hyperlipidemia, unspecified: Secondary | ICD-10-CM | POA: Diagnosis not present

## 2021-10-12 DIAGNOSIS — Z8616 Personal history of COVID-19: Secondary | ICD-10-CM | POA: Insufficient documentation

## 2021-10-12 DIAGNOSIS — E1151 Type 2 diabetes mellitus with diabetic peripheral angiopathy without gangrene: Secondary | ICD-10-CM | POA: Insufficient documentation

## 2021-10-12 DIAGNOSIS — I2510 Atherosclerotic heart disease of native coronary artery without angina pectoris: Secondary | ICD-10-CM | POA: Diagnosis not present

## 2021-10-12 DIAGNOSIS — I080 Rheumatic disorders of both mitral and aortic valves: Secondary | ICD-10-CM | POA: Insufficient documentation

## 2021-10-12 DIAGNOSIS — D509 Iron deficiency anemia, unspecified: Secondary | ICD-10-CM | POA: Diagnosis not present

## 2021-10-12 DIAGNOSIS — I3139 Other pericardial effusion (noninflammatory): Secondary | ICD-10-CM | POA: Diagnosis not present

## 2021-10-12 DIAGNOSIS — D500 Iron deficiency anemia secondary to blood loss (chronic): Secondary | ICD-10-CM

## 2021-10-12 DIAGNOSIS — I5042 Chronic combined systolic (congestive) and diastolic (congestive) heart failure: Secondary | ICD-10-CM | POA: Insufficient documentation

## 2021-10-12 DIAGNOSIS — E1122 Type 2 diabetes mellitus with diabetic chronic kidney disease: Secondary | ICD-10-CM | POA: Diagnosis not present

## 2021-10-12 DIAGNOSIS — I130 Hypertensive heart and chronic kidney disease with heart failure and stage 1 through stage 4 chronic kidney disease, or unspecified chronic kidney disease: Secondary | ICD-10-CM | POA: Diagnosis not present

## 2021-10-12 DIAGNOSIS — I480 Paroxysmal atrial fibrillation: Secondary | ICD-10-CM | POA: Diagnosis not present

## 2021-10-12 DIAGNOSIS — Z79899 Other long term (current) drug therapy: Secondary | ICD-10-CM | POA: Diagnosis not present

## 2021-10-12 DIAGNOSIS — I5022 Chronic systolic (congestive) heart failure: Secondary | ICD-10-CM

## 2021-10-12 DIAGNOSIS — I447 Left bundle-branch block, unspecified: Secondary | ICD-10-CM | POA: Diagnosis not present

## 2021-10-12 DIAGNOSIS — Z95810 Presence of automatic (implantable) cardiac defibrillator: Secondary | ICD-10-CM | POA: Diagnosis not present

## 2021-10-12 DIAGNOSIS — N184 Chronic kidney disease, stage 4 (severe): Secondary | ICD-10-CM | POA: Insufficient documentation

## 2021-10-12 DIAGNOSIS — Z87891 Personal history of nicotine dependence: Secondary | ICD-10-CM | POA: Insufficient documentation

## 2021-10-12 LAB — ECHOCARDIOGRAM COMPLETE
AV Vena cont: 0.4 cm
Area-P 1/2: 1.55 cm2
Calc EF: 45 %
MV M vel: 5.77 m/s
MV Peak grad: 132.9 mmHg
P 1/2 time: 560 msec
Radius: 0.6 cm
S' Lateral: 3.5 cm
Single Plane A2C EF: 46.2 %
Single Plane A4C EF: 44.7 %

## 2021-10-12 MED ORDER — TORSEMIDE 20 MG PO TABS
20.0000 mg | ORAL_TABLET | ORAL | 6 refills | Status: DC
Start: 2021-10-12 — End: 2021-12-13

## 2021-10-12 NOTE — Progress Notes (Signed)
°  Echocardiogram 2D Echocardiogram has been performed.  Fidel Levy 10/12/2021, 2:04 PM

## 2021-10-12 NOTE — Patient Instructions (Signed)
Medication Changes:  Double Torsemide for 3 days.   Can take extra Torsemide to keep weight 141lb or less Lab Work:  none  Testing/Procedures:  none  Referrals:  none  Special Instructions // Education:  none  Follow-Up in: 2-3 months  At the [facility], you and your health needs are our priority. We have a designated team specialized in the treatment of Heart Failure. This Care Team includes your primary Heart Failure Specialized Cardiologist (physician), Advanced Practice Providers (APPs- Physician Assistants and Nurse Practitioners), and Pharmacist who all work together to provide you with the care you need, when you need it.   You may see any of the following providers on your designated Care Team at your next follow up:  Dr Glori Bickers Dr Haynes Kerns, NP Lyda Jester, [place] [facility] [place], [place] Audry Riles, PharmD   Please be sure to bring in all your medications bottles to every appointment.   Need to Contact us:  If you have any questions or concerns before your next appointment please send [place] a message through [place] or call our office at [phone].    TO LEAVE A MESSAGE FOR THE NURSE SELECT OPTION 2, PLEASE LEAVE A MESSAGE INCLUDING: YOUR NAME DATE OF BIRTH CALL BACK NUMBER REASON FOR CALL**this is important as we prioritize the call backs  YOU WILL RECEIVE A CALL BACK THE SAME DAY AS [place] AS YOU CALL BEFORE 4:00 PM

## 2021-10-12 NOTE — Progress Notes (Signed)
Advanced Heart Failure Clinic Note    Primary Cardiologist: Dr Vaughan Browner Nephrologist: Dr Hollie Salk  HPI: Krista Mason Krista Mason is a 85 y.o. female with a history of HTN, HL, CKD 3, moderate mitral regurgitation, DM, and chronic systolic heart failure .  Admitted 4/19 with dyspnea. ECHO had gone down to 35-40% (previously 45%). Had RHC with normal filling pressures and cardiac output. Creatinine peaked at 2.1 so lasix was held for few days after discharge. Myeloma panel - 0.3 M protein noted. PYP scan was negative for TTR. Has seen Dr. Beryle Beams and not felt to have myeloma.   Has had CPX test 5/209 which showed - submax test with moderate to severe functional limitation due to HF, lung disease and deconditioning.    Echo 3/20 EF 35-40% moderate MR. RV ok Personally reviewed  Had GI bleed in 10/20. EGD with grade D esophagitis and stricture  Underwent MDT CRT-D on 07/24/19 subsequently found to have episodes of AF.  Amiodarone started.   Echo 3/21 EF 45-50% Personally reviewed  Diagnosed with Covid in early May. Admitted to hospital in 5/22 with acute on chronic anemia secondary to upper GI bleeding: EGD 02/28/21 per Dr. Michail Sermon noted severe distal ulcerated esophagitis w/ a stricture.  Eliquis held for 2 weeks. Discharged to SNF. Remains at [place].   Here with her daughter. Says she remains SOB and fatigued. Hgb 6.6 -> 7.2. Got 1u RBC and IV iron. EGD with esophageal ulcer and moderate stricture. Due for labs and 2nd iron infusion on Tuesday. SOB with mild activity   Echo today 10/12/21 EF 45-50% moderate MR Personally reviewed  ICD interrogation today: Volume elevated No VT/AF 100% BiV pacing. Activity level < 1hr Personally reviewed   Cardiac studies:   5/19 CPX FVC 1.97 (73%)      FEV1 1.24 (61%)        FEV1/FVC 63 (81%)        MVV 49 (59%)       Resting HR: 58 Peak HR: 110   (78% age predicted max HR) BP rest: 152/66 BP peak: 214/70 Peak VO2: 9.6 (57% predicted peak  VO2) VE/VCO2 slope:  50 OUES: 0.87 Peak RER: 0.93 VE/MVV:  70% O2pulse:  6   (75% predicted O2pulse)   01/2018 PYP scan negative for TTR  RHC 01/2018  RA = 3 RV = 29/3 PA = 25/12 (16) PCW = 8 Fick cardiac output/index = 5.3/3.0 PVR = 1.5 WU Ao sat = 97% PA sat = 65%, 67%  Assessment: 1. Normal filling pressures and outputs  LHC 01/2017  Heavily calcified but non-obstructive CAD. 50% RCA and 30% LAD.    ECHO 01/2018  EF 35%  Severe concentric LVH with diffuse hypokinesis and paradoxical   septal motion. Thickened aortic and mitral valve leaflets. Mild   aortic regurgitation and moderate mitral regurgitation. Trivial   posteriorly located pericardial effusion.   Evaluation for cardiac amyloidosis should be considered .  ECHO 2018, the LVEF is improved from   35-40% up to 40-45%. There is persistent moderate mitral   regurgitation.  Review of systems complete and found to be negative unless listed in HPI.    SH:  Social History   Socioeconomic History   Marital status: Widowed    Spouse name: Not on file   Number of children: Not on file   Years of education: Not on file   Highest education level: Not on file  Occupational History   Not on file  Tobacco Use  Smoking status: Former    Packs/day: 0.75    Years: 60.00    Pack years: 45.00    Types: Cigarettes    Quit date: 11/04/2016    Years since quitting: 4.9   Smokeless tobacco: Never  Vaping Use   Vaping Use: Never used  Substance and Sexual Activity   Alcohol use: No   Drug use: No   Sexual activity: Not on file  Other Topics Concern   Not on file  Social History Narrative   Lives at [facility].   Social Determinants of Health   Financial Resource Strain: Not on file  Food Insecurity: Not on file  Transportation Needs: Not on file  Physical Activity: Not on file  Stress: Not on file  Social Connections: Not on file  Intimate Partner Violence: Not on file    FH:  Family  History  Problem Relation Age of Onset   Heart attack Father    Heart disease Father    Cancer Father    Hypertension Father    Hypertension Brother    Stroke Brother    Hypertension Brother    Diabetes Son    Hyperlipidemia Son    Hypertension Son     Past Medical History:  Diagnosis Date   Anemia    Atrial fibrillation ([place])    Chronic combined systolic and diastolic CHF (congestive heart failure) ([place])    a. LV dysfcuntion felt to be related to PVCs   Chronic kidney disease    Diabetes mellitus without complication (HCC)    GERD (gastroesophageal reflux disease)    Hx of echocardiogram    Echo (1/16):  EF 60-65%, no RWMA, Gr 1 DD, mod AI, mod MR, mild LAE   Hyperkalemia    Hyperlipidemia    Hypertension    NICM (nonischemic cardiomyopathy) ([place])    a. 12/2018 Echo: EF 35%, diff HK w/ septal-lateral dyssynchrony. Nl RV fxn; b. 07/2019 s/p MDT [id number] [name] CRT-P MRI SureScan device (ser #: [id number])   Orthostatic hypotension    PVC (premature ventricular contraction)    a. s/p PVC ablation in 11/2016   Rheumatic fever    Wears dentures     Current Outpatient Medications  Medication Sig Dispense Refill   acetaminophen (TYLENOL) 500 MG tablet Take 1,000 mg by mouth every 6 (six) hours as needed for moderate pain, headache or mild pain.     amiodarone (PACERONE) 100 MG tablet Take 100 mg by mouth every other day.     atorvastatin (LIPITOR) 20 MG tablet Take 20 mg by mouth every other day.     carvedilol (COREG) 12.5 MG tablet TAKE ONE TABLET BY MOUTH TWICE DAILY WITH A MEAL 60 tablet 5   cholecalciferol (VITAMIN D3) 25 MCG (1000 UNIT) tablet Take 2,000 Units by mouth every morning.     Cyanocobalamin (VITAMIN B-12) 2500 MCG SUBL Take 2,500 mcg by mouth every other day.     diphenhydrAMINE (BENADRYL) 25 mg capsule Take 25 mg by mouth every 6 (six) hours as needed (allergies/sleep).     Ensure (ENSURE) Take 237 mLs by mouth daily.     hydrALAZINE (APRESOLINE) 50 MG  tablet Take 50 mg by mouth 2 (two) times daily with a meal.     pantoprazole (PROTONIX) 40 MG tablet Take 40 mg by mouth in the morning and at bedtime.     Polyethyl Glycol-Propyl Glycol 0.4-0.3 % SOLN Place 1 drop into both eyes every 8 (eight) hours as needed (dryness).  polyethylene glycol (MIRALAX / GLYCOLAX) 17 g packet Take 17 g by mouth daily as needed (constipation.).     sucralfate (CARAFATE) 1 GM/10ML suspension Take 10 mLs (1 g total) by mouth 4 (four) times daily. 3600 mL 0   torsemide (DEMADEX) 20 MG tablet Take 20-40 mg by mouth See admin instructions. Take 1 tablet (20 mg) by mouth scheduled every morning & may take 2 tablets (40 mg) by mouth in the morning if experiencing fluid retention.     ELIQUIS 2.5 MG TABS tablet TAKE ONE TABLET BY MOUTH TWICE DAILY (Patient not taking: Reported on 10/12/2021) 180 tablet 1   No current facility-administered medications for this encounter.    Vitals:   10/12/21 1407  BP: 124/70  Pulse: 70  SpO2: 99%  Weight: 67 kg (147 lb 9.6 oz)   Filed Weights   10/12/21 1407  Weight: 67 kg (147 lb 9.6 oz)   Wt Readings from Last 3 Encounters:  10/12/21 67 kg (147 lb 9.6 oz)  10/10/21 65.1 kg (143 lb 8.3 oz)  06/23/21 65.1 kg (143 lb 9.6 oz)   PHYSICAL EXAM: General:  Elderly . No resp difficulty HEENT: normal Neck: supple. no JVD. Carotids 2+ bilat; no bruits. No lymphadenopathy or thryomegaly appreciated. Cor: PMI nondisplaced. Regular rate & rhythm. No rubs, gallops or murmurs. Lungs: clear Abdomen: soft, nontender, nondistended. No hepatosplenomegaly. No bruits or masses. Good bowel sounds. Extremities: no cyanosis, clubbing, rash, edema Neuro: alert & orientedx3, cranial nerves grossly intact. moves all 4 extremities w/o difficulty. Affect pleasant   ASSESSMENT & PLAN:  1. Chronic combined CHF: NICM s/p PVC ablation. Now with LBBB. Echo 8/18 EF 40% (unchanged from prior).  - Echo 01/2018 LVEF 35-40%, Moderate MR, LVH. Concern for  amyloid.  - PYP scan negative for TTR amyloid. Has MGUS and has seen Dr. Beryle Beams but low suspicion for AL amyloid/myeloma. 24 hour urine with no Mspike - Echo 3/20 EF stable at 35-40% with moderate MR - CPX 5/19 poor efforts RER 0.9 but suggests severe multifactorial limitation but HF predominates. Unfortunately, not candidate for advanced therapies due to age and comorbidities. - s/p CRT-D on 07/24/19. ICD interrogation as above - much improved with CRT. NYHA III now in setting of severe anemia - Volume status elevated - Double torsemide x 3 days and take extra as needed to keep weight 141 or less - Echo 3/21 EF 45-50%  - Echo today 10/12/21 EF 45-50% moderate MR Personally reviewed - continue carvedilol 12.5 bid - continue hydralazine 50 bid - No ACE/ARB/ARNI/spiro/SGLT2i with CKD IV  2. Chest pain - Last cath 01/2017 with heavily calcified but non-obstructive CAD. 50% RCA and 30% LAD.  - No current angina - CT chest stable with old granulomatous disease. - Continue ASA and statin  3. PVCs - s/p ablation. Off Amiodarone therapy. PVC burden low   4. LBBB - EF 35-40% on echo 3/20 + moderate MR. LBBB ~138ms. - s/p CRT 07/24/19  - EF 45-50% on echo 3/21 and today 10/12/21  6. CKD IV - Baseline creatinine 1.7-1.9. Last value 2.1 - Following with Dr Hollie Salk  7. HTN - BP well controlled  8. PAD - ABIs showed moderate disease BLE. Has seen [facility]. Medical therapy for now. No change.   9. PAF - quiescent on amio.   10. Iron-deficient anemia with recurrent GI bleeding - Recent EGD with esophageal ulcer - getting feraheme and RBC transfusions per GI and PCP - Ideally keep hgb > 8.0  Quillian Quince  Braeson Rupe, MD  3:10 PM

## 2021-10-13 ENCOUNTER — Encounter (HOSPITAL_COMMUNITY): Payer: Medicare HMO

## 2021-10-17 ENCOUNTER — Encounter (HOSPITAL_COMMUNITY)
Admission: RE | Admit: 2021-10-17 | Discharge: 2021-10-17 | Disposition: A | Discharge status: Home / Self Care | Payer: Medicare HMO | Source: Ambulatory Visit | Attending: Nephrology | Admitting: Nephrology

## 2021-10-17 DIAGNOSIS — D631 Anemia in chronic kidney disease: Secondary | ICD-10-CM | POA: Diagnosis present

## 2021-10-17 MED ORDER — SODIUM CHLORIDE 0.9 % IV SOLN
510.0000 mg | INTRAVENOUS | Status: DC
  Administered 2021-10-17: 12:00:00 510 mg via INTRAVENOUS
  Filled 2021-10-17: qty 510

## 2021-10-20 ENCOUNTER — Other Ambulatory Visit: Payer: Self-pay | Admitting: Cardiology

## 2021-10-31 ENCOUNTER — Other Ambulatory Visit: Payer: Self-pay

## 2021-10-31 ENCOUNTER — Ambulatory Visit (HOSPITAL_COMMUNITY)
Admission: RE | Admit: 2021-10-31 | Discharge: 2021-10-31 | Disposition: A | Discharge status: Home / Self Care | Payer: Medicare HMO | Source: Ambulatory Visit | Attending: Nephrology | Admitting: Nephrology

## 2021-10-31 ENCOUNTER — Ambulatory Visit (INDEPENDENT_AMBULATORY_CARE_PROVIDER_SITE_OTHER): Payer: Medicare HMO

## 2021-10-31 VITALS — BP 138/56 | HR 68 | Temp 97.3°F | Resp 18

## 2021-10-31 DIAGNOSIS — I442 Atrioventricular block, complete: Secondary | ICD-10-CM | POA: Diagnosis not present

## 2021-10-31 DIAGNOSIS — N183 Chronic kidney disease, stage 3 unspecified: Secondary | ICD-10-CM

## 2021-10-31 DIAGNOSIS — D631 Anemia in chronic kidney disease: Secondary | ICD-10-CM | POA: Diagnosis not present

## 2021-10-31 LAB — CUP PACEART REMOTE DEVICE CHECK
Battery Remaining Longevity: 91 mo
Battery Voltage: 2.98 V
Brady Statistic AP VP Percent: 84.66 %
Brady Statistic AP VS Percent: 0.06 %
Brady Statistic AS VP Percent: 11.17 %
Brady Statistic AS VS Percent: 4.1 %
Brady Statistic RA Percent Paced: 84.93 %
Brady Statistic RV Percent Paced: 95.83 %
Date Time Interrogation Session: 20230109232155
Implantable Lead Implant Date: 20201002
Implantable Lead Implant Date: 20201002
Implantable Lead Implant Date: 20201002
Implantable Lead Location: 753858
Implantable Lead Location: 753859
Implantable Lead Location: 753860
Implantable Lead Model: 4598
Implantable Lead Model: 5076
Implantable Lead Model: 5076
Implantable Pulse Generator Implant Date: 20201002
Lead Channel Impedance Value: 304 Ohm
Lead Channel Impedance Value: 323 Ohm
Lead Channel Impedance Value: 342 Ohm
Lead Channel Impedance Value: 380 Ohm
Lead Channel Impedance Value: 399 Ohm
Lead Channel Impedance Value: 399 Ohm
Lead Channel Impedance Value: 475 Ohm
Lead Channel Impedance Value: 532 Ohm
Lead Channel Impedance Value: 551 Ohm
Lead Channel Impedance Value: 570 Ohm
Lead Channel Impedance Value: 589 Ohm
Lead Channel Impedance Value: 589 Ohm
Lead Channel Impedance Value: 608 Ohm
Lead Channel Impedance Value: 608 Ohm
Lead Channel Pacing Threshold Amplitude: 0.625 V
Lead Channel Pacing Threshold Amplitude: 0.625 V
Lead Channel Pacing Threshold Amplitude: 2.5 V
Lead Channel Pacing Threshold Pulse Width: 0.4 ms
Lead Channel Pacing Threshold Pulse Width: 0.4 ms
Lead Channel Pacing Threshold Pulse Width: 0.4 ms
Lead Channel Sensing Intrinsic Amplitude: 1.875 mV
Lead Channel Sensing Intrinsic Amplitude: 1.875 mV
Lead Channel Sensing Intrinsic Amplitude: 31.625 mV
Lead Channel Sensing Intrinsic Amplitude: 31.625 mV
Lead Channel Setting Pacing Amplitude: 1.5 V
Lead Channel Setting Pacing Amplitude: 2 V
Lead Channel Setting Pacing Amplitude: 2.25 V
Lead Channel Setting Pacing Pulse Width: 0.4 ms
Lead Channel Setting Pacing Pulse Width: 0.4 ms
Lead Channel Setting Sensing Sensitivity: 1.2 mV

## 2021-10-31 LAB — POCT HEMOGLOBIN-HEMACUE: Hemoglobin: 10 g/dL — ABNORMAL LOW (ref 12.0–15.0)

## 2021-10-31 LAB — IRON AND TIBC
Iron: 82 ug/dL (ref 28–170)
Saturation Ratios: 24 % (ref 10.4–31.8)
TIBC: 339 ug/dL (ref 250–450)
UIBC: 257 ug/dL

## 2021-10-31 LAB — FERRITIN: Ferritin: 296 ng/mL (ref 11–307)

## 2021-10-31 MED ORDER — DARBEPOETIN ALFA 100 MCG/0.5ML IJ SOSY
PREFILLED_SYRINGE | INTRAMUSCULAR | Status: AC
  Filled 2021-10-31: qty 0.5

## 2021-10-31 MED ORDER — DARBEPOETIN ALFA 100 MCG/0.5ML IJ SOSY
100.0000 ug | PREFILLED_SYRINGE | INTRAMUSCULAR | Status: DC
  Administered 2021-10-31: 100 ug via SUBCUTANEOUS

## 2021-11-03 DIAGNOSIS — N184 Chronic kidney disease, stage 4 (severe): Secondary | ICD-10-CM | POA: Diagnosis not present

## 2021-11-08 DIAGNOSIS — N184 Chronic kidney disease, stage 4 (severe): Secondary | ICD-10-CM | POA: Diagnosis not present

## 2021-11-08 DIAGNOSIS — D509 Iron deficiency anemia, unspecified: Secondary | ICD-10-CM | POA: Diagnosis not present

## 2021-11-08 DIAGNOSIS — I4891 Unspecified atrial fibrillation: Secondary | ICD-10-CM | POA: Diagnosis not present

## 2021-11-08 DIAGNOSIS — I129 Hypertensive chronic kidney disease with stage 1 through stage 4 chronic kidney disease, or unspecified chronic kidney disease: Secondary | ICD-10-CM | POA: Diagnosis not present

## 2021-11-08 DIAGNOSIS — I739 Peripheral vascular disease, unspecified: Secondary | ICD-10-CM | POA: Diagnosis not present

## 2021-11-08 DIAGNOSIS — I5022 Chronic systolic (congestive) heart failure: Secondary | ICD-10-CM | POA: Diagnosis not present

## 2021-11-08 DIAGNOSIS — K2210 Ulcer of esophagus without bleeding: Secondary | ICD-10-CM | POA: Diagnosis not present

## 2021-11-08 DIAGNOSIS — J449 Chronic obstructive pulmonary disease, unspecified: Secondary | ICD-10-CM | POA: Diagnosis not present

## 2021-11-08 DIAGNOSIS — E785 Hyperlipidemia, unspecified: Secondary | ICD-10-CM | POA: Diagnosis not present

## 2021-11-09 NOTE — Progress Notes (Signed)
Remote pacemaker transmission.   

## 2021-11-20 DIAGNOSIS — N184 Chronic kidney disease, stage 4 (severe): Secondary | ICD-10-CM | POA: Diagnosis not present

## 2021-11-20 DIAGNOSIS — E785 Hyperlipidemia, unspecified: Secondary | ICD-10-CM | POA: Diagnosis not present

## 2021-11-20 DIAGNOSIS — I10 Essential (primary) hypertension: Secondary | ICD-10-CM | POA: Diagnosis not present

## 2021-11-20 DIAGNOSIS — I5040 Unspecified combined systolic (congestive) and diastolic (congestive) heart failure: Secondary | ICD-10-CM | POA: Diagnosis not present

## 2021-11-20 DIAGNOSIS — E1122 Type 2 diabetes mellitus with diabetic chronic kidney disease: Secondary | ICD-10-CM | POA: Diagnosis not present

## 2021-11-20 DIAGNOSIS — E119 Type 2 diabetes mellitus without complications: Secondary | ICD-10-CM | POA: Diagnosis not present

## 2021-11-20 DIAGNOSIS — J449 Chronic obstructive pulmonary disease, unspecified: Secondary | ICD-10-CM | POA: Diagnosis not present

## 2021-11-20 DIAGNOSIS — I4891 Unspecified atrial fibrillation: Secondary | ICD-10-CM | POA: Diagnosis not present

## 2021-11-21 ENCOUNTER — Ambulatory Visit
Admission: RE | Admit: 2021-11-21 | Discharge: 2021-11-21 | Disposition: A | Discharge status: Home / Self Care | Payer: Medicare HMO | Source: Ambulatory Visit | Attending: Family Medicine | Admitting: Family Medicine

## 2021-11-21 ENCOUNTER — Other Ambulatory Visit: Payer: Self-pay | Admitting: Family Medicine

## 2021-11-21 ENCOUNTER — Emergency Department (HOSPITAL_COMMUNITY)
Admission: EM | Admit: 2021-11-21 | Discharge: 2021-11-22 | Disposition: A | Discharge status: Home / Self Care | Payer: Medicare HMO | Attending: Student | Admitting: Student

## 2021-11-21 ENCOUNTER — Other Ambulatory Visit: Payer: Self-pay

## 2021-11-21 DIAGNOSIS — E119 Type 2 diabetes mellitus without complications: Secondary | ICD-10-CM | POA: Diagnosis not present

## 2021-11-21 DIAGNOSIS — L089 Local infection of the skin and subcutaneous tissue, unspecified: Secondary | ICD-10-CM | POA: Diagnosis not present

## 2021-11-21 DIAGNOSIS — Z79899 Other long term (current) drug therapy: Secondary | ICD-10-CM | POA: Diagnosis not present

## 2021-11-21 DIAGNOSIS — Z7901 Long term (current) use of anticoagulants: Secondary | ICD-10-CM | POA: Insufficient documentation

## 2021-11-21 DIAGNOSIS — N189 Chronic kidney disease, unspecified: Secondary | ICD-10-CM | POA: Diagnosis not present

## 2021-11-21 DIAGNOSIS — M7989 Other specified soft tissue disorders: Secondary | ICD-10-CM

## 2021-11-21 DIAGNOSIS — M86141 Other acute osteomyelitis, right hand: Secondary | ICD-10-CM | POA: Diagnosis not present

## 2021-11-21 DIAGNOSIS — M869 Osteomyelitis, unspecified: Secondary | ICD-10-CM

## 2021-11-21 DIAGNOSIS — I4891 Unspecified atrial fibrillation: Secondary | ICD-10-CM | POA: Diagnosis not present

## 2021-11-21 DIAGNOSIS — E1122 Type 2 diabetes mellitus with diabetic chronic kidney disease: Secondary | ICD-10-CM | POA: Diagnosis not present

## 2021-11-21 LAB — COMPREHENSIVE METABOLIC PANEL
ALT: 14 U/L (ref 0–44)
AST: 16 U/L (ref 15–41)
Albumin: 3.3 g/dL — ABNORMAL LOW (ref 3.5–5.0)
Alkaline Phosphatase: 69 U/L (ref 38–126)
Anion gap: 10 (ref 5–15)
BUN: 48 mg/dL — ABNORMAL HIGH (ref 8–23)
CO2: 24 mmol/L (ref 22–32)
Calcium: 9.9 mg/dL (ref 8.9–10.3)
Chloride: 99 mmol/L (ref 98–111)
Creatinine, Ser: 1.98 mg/dL — ABNORMAL HIGH (ref 0.44–1.00)
GFR, Estimated: 25 mL/min — ABNORMAL LOW (ref >60)
Glucose, Bld: 175 mg/dL — ABNORMAL HIGH (ref 70–99)
Potassium: 3.4 mmol/L — ABNORMAL LOW (ref 3.5–5.1)
Sodium: 133 mmol/L — ABNORMAL LOW (ref 135–145)
Total Bilirubin: 0.5 mg/dL (ref 0.3–1.2)
Total Protein: 6.9 g/dL (ref 6.5–8.1)

## 2021-11-21 LAB — CBC WITH DIFFERENTIAL/PLATELET
Abs Immature Granulocytes: 0.04 10*3/uL (ref 0.00–0.07)
Basophils Absolute: 0 10*3/uL (ref 0.0–0.1)
Basophils Relative: 0 %
Eosinophils Absolute: 0.1 10*3/uL (ref 0.0–0.5)
Eosinophils Relative: 2 %
HCT: 34 % — ABNORMAL LOW (ref 36.0–46.0)
Hemoglobin: 10.7 g/dL — ABNORMAL LOW (ref 12.0–15.0)
Immature Granulocytes: 1 %
Lymphocytes Relative: 23 %
Lymphs Abs: 1.2 10*3/uL (ref 0.7–4.0)
MCH: 29.2 pg (ref 26.0–34.0)
MCHC: 31.5 g/dL (ref 30.0–36.0)
MCV: 92.6 fL (ref 80.0–100.0)
Monocytes Absolute: 0.4 10*3/uL (ref 0.1–1.0)
Monocytes Relative: 8 %
Neutro Abs: 3.3 10*3/uL (ref 1.7–7.7)
Neutrophils Relative %: 66 %
Platelets: 228 10*3/uL (ref 150–400)
RBC: 3.67 MIL/uL — ABNORMAL LOW (ref 3.87–5.11)
RDW: 20.3 % — ABNORMAL HIGH (ref 11.5–15.5)
WBC: 5 10*3/uL (ref 4.0–10.5)
nRBC: 0 % (ref 0.0–0.2)

## 2021-11-21 LAB — LACTIC ACID, PLASMA: Lactic Acid, Venous: 1.3 mmol/L (ref 0.5–1.9)

## 2021-11-21 LAB — PROTIME-INR
INR: 1.2 (ref 0.8–1.2)
Prothrombin Time: 15.1 seconds (ref 11.4–15.2)

## 2021-11-21 NOTE — ED Provider Triage Note (Signed)
Emergency Medicine Provider Triage Evaluation Note  Krista Mason , a 86 y.o. female  was evaluated in triage.  Pt complains of right second finger infection.  Patient's initially had an arthritic node on this finger at the DIP joint.  This eventually progressed to an open wound.  She was seen at her primary care today, and then was sent over to [facility] imaging for evaluation of possible osteomyelitis.  Patient has a history of diabetes, was previously on metformin, but is no longer on this.  The x-ray performed was consistent with osteomyelitis.  Review of Systems  Positive: Right second finger wound Negative: Fever, chills, numbness  Physical Exam  BP (!) 159/52    Pulse (!) 116    Temp 97.7 F (36.5 C)    Resp 20    SpO2 95%  Gen:   Awake, no distress   Resp:  Normal effort  MSK:   Moves extremities without difficulty  Other:  Erythematous and edematous right second digit DIP joint with questionable purulence  Medical Decision Making  Medically screening exam initiated at 5:22 PM.  Appropriate orders placed.  Krista Mason was informed that the remainder of the evaluation will be completed by another provider, this initial triage assessment does not replace that evaluation, and the importance of remaining in the ED until their evaluation is complete.  Upon chart review I personally interpreted the x-ray performed by [facility] imaging that has findings consistent with osteomyelitis.  As patient is a diabetic, she is at increased risk for poor wound healing.  Will obtain labs prior to likely IV antibiotics.   Kateri Plummer, PA-C 11/21/21 1724

## 2021-11-21 NOTE — ED Triage Notes (Signed)
Pt sent from [facility] for further eval of possible osteomyelitis in R index finger. Reports she is a borderline diabetic. Has open wound to finger with redness and swelling.

## 2021-11-22 LAB — SEDIMENTATION RATE: Sed Rate: 47 mm/hr — ABNORMAL HIGH (ref 0–22)

## 2021-11-22 LAB — C-REACTIVE PROTEIN: CRP: 0.7 mg/dL (ref <1.0)

## 2021-11-22 MED ORDER — SULFAMETHOXAZOLE-TRIMETHOPRIM 800-160 MG PO TABS: 1.0000 | ORAL_TABLET | Freq: Two times a day (BID) | ORAL | 0 refills | Status: DC

## 2021-11-22 MED ORDER — DOXYCYCLINE HYCLATE 100 MG PO CAPS: 100.0000 mg | ORAL_CAPSULE | Freq: Two times a day (BID) | ORAL | 0 refills | Status: DC

## 2021-11-22 NOTE — Discharge Instructions (Addendum)
Take antibiotics as prescribed. Follow up with Dr. Lenon Curt (hand surgeon), call today to schedule an appointment.

## 2021-11-22 NOTE — ED Provider Notes (Signed)
[facility] Provider Note   CSN: [id number] Arrival date & time: 11/21/21  1703     History  No chief complaint on file.   Krista Mason is a 86 y.o. female.  86yo female with swelling to right index DIP, swelling onset around Christmas, proressively worsening. Saw PCP yesterday who saw her, ordered and xr and called to tell her to go to the ER to see a Copy. Left DIP is starting to feel similar to the right when her symptoms started. Left hand dominant.       Home Medications Prior to Admission medications   Medication Sig Start Date End Date Taking? Authorizing Provider  doxycycline (VIBRAMYCIN) 100 MG capsule Take 1 capsule (100 mg total) by mouth 2 (two) times daily. 11/22/21  Yes Tacy Learn, PA-C  acetaminophen (TYLENOL) 500 MG tablet Take 1,000 mg by mouth every 6 (six) hours as needed for moderate pain, headache or mild pain.    [provider]  amiodarone (PACERONE) 100 MG tablet Take 100 mg by mouth every other day.    [provider]  atorvastatin (LIPITOR) 20 MG tablet Take 20 mg by mouth every other day.    [provider]  carvedilol (COREG) 12.5 MG tablet TAKE ONE TABLET BY MOUTH TWICE DAILY WITH A MEAL 07/21/21   Bensimhon, Shaune Pascal, MD  cholecalciferol (VITAMIN D3) 25 MCG (1000 UNIT) tablet Take 2,000 Units by mouth every morning.    [provider]  Cyanocobalamin (VITAMIN B-12) 2500 MCG SUBL Take 2,500 mcg by mouth every other day.    [provider]  diphenhydrAMINE (BENADRYL) 25 mg capsule Take 25 mg by mouth every 6 (six) hours as needed (allergies/sleep).    [provider]  ELIQUIS 2.5 MG TABS tablet TAKE ONE TABLET BY MOUTH TWICE DAILY Patient not taking: Reported on 10/12/2021 07/21/21   Camnitz, Ocie Doyne, MD  Ensure (ENSURE) Take 237 mLs by mouth daily.    [provider]  hydrALAZINE (APRESOLINE) 50 MG tablet TAKE ONE TABLET BY MOUTH TWICE  DAILY 10/20/21   Nahser, Wonda Cheng, MD  pantoprazole (PROTONIX) 40 MG tablet Take 40 mg by mouth in the morning and at bedtime.    [provider]  Polyethyl Glycol-Propyl Glycol 0.4-0.3 % SOLN Place 1 drop into both eyes every 8 (eight) hours as needed (dryness).    [provider]  polyethylene glycol (MIRALAX / GLYCOLAX) 17 g packet Take 17 g by mouth daily as needed (constipation.).    [provider]  sucralfate (CARAFATE) 1 GM/10ML suspension Take 10 mLs (1 g total) by mouth 4 (four) times daily. 10/10/21 01/08/22  Otis Brace, MD  torsemide (DEMADEX) 20 MG tablet Take 1-2 tablets (20-40 mg total) by mouth See admin instructions. Take 1 tablet (20 mg) by mouth scheduled every morning & may take 2 tablets (40 mg) by mouth in the morning if experiencing fluid retention. 10/12/21   Bensimhon, Shaune Pascal, MD      Allergies    Epoetin alfa, Penicillins, Retacrit [epoetin (alfa)], Crestor [rosuvastatin], Hydrocodone, Influenza vac split quad, Tape, Cefaclor, Crestor [rosuvastatin calcium], Influenza vaccines, Lidocaine hcl, Nsaids, Percocet [oxycodone-acetaminophen], and Prednisone    Review of Systems   Review of Systems  Constitutional:  Negative for diaphoresis and fever.  Musculoskeletal:  Positive for arthralgias and joint swelling.  Skin:  Positive for wound.  Allergic/Immunologic: Positive for immunocompromised state.  Neurological:  Negative for weakness and numbness.   Physical  Exam Updated Vital Signs BP 130/63 (BP Location: Left Arm)    Pulse 68    Temp 97.7 F (36.5 C)    Resp 15    SpO2 98%  Physical Exam Vitals and nursing note reviewed.  Constitutional:      General: She is not in acute distress.    Appearance: She is well-developed. She is not diaphoretic.  HENT:     Head: Normocephalic and atraumatic.  Cardiovascular:     Pulses: Normal pulses.  Pulmonary:     Effort: Pulmonary effort is normal.  Musculoskeletal:        General:  Swelling and tenderness present.     Comments: Swelling of right index finger with tender nodules to DIP with small ulcer to dorsal aspect.  Patient is able to range the finger without pain, states that she is unable to fully flex secondary to swelling of the digit.  No tenderness with palpation along flexor or extensor tendons.  Cap refill present.  Skin:    General: Skin is warm and dry.     Findings: Erythema present.  Neurological:     Mental Status: She is alert and oriented to person, place, and time.  Psychiatric:        Behavior: Behavior normal.       ED Results / Procedures / Treatments   Labs (all labs ordered are listed, but only abnormal results are displayed) Labs Reviewed  COMPREHENSIVE METABOLIC PANEL - Abnormal; Notable for the following components:      Result Value   Sodium 133 (*)    Potassium 3.4 (*)    Glucose, Bld 175 (*)    BUN 48 (*)    Creatinine, Ser 1.98 (*)    Albumin 3.3 (*)    GFR, Estimated 25 (*)    All other components within normal limits  CBC WITH DIFFERENTIAL/PLATELET - Abnormal; Notable for the following components:   RBC 3.67 (*)    Hemoglobin 10.7 (*)    HCT 34.0 (*)    RDW 20.3 (*)    All other components within normal limits  CULTURE, BLOOD (ROUTINE X 2)  CULTURE, BLOOD (ROUTINE X 2)  LACTIC ACID, PLASMA  PROTIME-INR  C-REACTIVE PROTEIN  LACTIC ACID, PLASMA  SEDIMENTATION RATE    EKG None  Radiology DG Hand 2 View Right  Result Date: 11/21/2021 CLINICAL DATA:  An 86 year old female presents for evaluation of finger swelling, swelling to index finger and open wound at the distal interphalangeal joint. EXAM: RIGHT HAND - 2 VIEW COMPARISON:  None FINDINGS: Soft tissue swelling about the distal interphalangeal joint of the index finger as reported. Bony destruction and lucency about the distal interphalangeal joint involving the distal aspect of the middle phalanx in the proximal aspect of the distal phalanx. Osteopenia elsewhere.   No visible soft tissue gas. Degenerative changes throughout the hand most notably in distal interphalangeal joints and first carpometacarpal joint. IMPRESSION: 1. Soft tissue swelling about the distal interphalangeal joint of the index finger with signs of bony destruction about the distal interphalangeal joint. In the setting of open wound and soft tissue swelling findings are compatible with osteomyelitis. 2. Osteopenia with diffuse degenerative changes throughout the distal interphalangeal joints and first carpometacarpal joint. Electronically Signed   By: Zetta Bills M.D.   On: 11/21/2021 14:08    Procedures Procedures    Medications Ordered in ED Medications - No data to display  ED Course/ Medical Decision Making/ A&P  Medical Decision Making Amount and/or Complexity of Data Reviewed Labs: ordered.  Risk Prescription drug management.   This patient presents to the ED for concern of pain and swelling in her right index finger, sent by PCP office for osteomyelitis, this involves an extensive number of treatment options, and is a complaint that carries with it a high risk of complications and morbidity.  The differential diagnosis includes but not limited to osteomyelitis, infectious or rheumatologic process   Co morbidities that complicate the patient evaluation  A-fib, CKD with GFR 25, diabetes   Additional history obtained:  External records from outside source obtained and reviewed including x-ray obtained outpatient on 11/21/2021 showing lucency of the right index finger at the DIP concerning for osteomyelitis, agree with radiologist interpretation.   Lab Tests:  I Ordered, and personally interpreted labs.  The pertinent results include: CBC with normal white blood cell count, hemoglobin of 10.7 without significant change from prior.  CMP with creatinine of 1.98 in this patient with known chronic kidney disease.  Her GFR is 25.  Lactic acid  negative.  Add on sed rate and CRP for follow-up purposes for concern for osteomyelitis.  Consultations Obtained:  I requested consultation with the hand Ortho consult per Hilbert Odor, PA-C,  and discussed lab and imaging findings as well as pertinent plan - they recommend: Antibiotics and follow-up in the office later this week/early next week   Problem List / ED Course:  86 year old female sent by PCP office after outpatient x-ray revealed concern for osteomyelitis of the right index finger at the DIP.  This is her nondominant hand.  She does state that she is having some discomfort in her left index finger as well, no significant changes to left index finger on exam at this time.  Right index finger is swollen and tender at the DIP, she has reasonable range of motion which is limited only by swelling of the digit, do not suspect tenosynovitis at this time.  Case was discussed with Ortho who recommends antibiotics and follow-up in the office.  Patient and family at bedside agreeable with plan of care.   Dispostion:  After consideration of the diagnostic results and the patients response to treatment, I feel that the patent would benefit from follow-up with orthopedics.          Final Clinical Impression(s) / ED Diagnoses Final diagnoses:  Osteomyelitis of finger of right hand ([place])    Rx / DC Orders ED Discharge Orders          Ordered    sulfamethoxazole-trimethoprim (BACTRIM DS) 800-160 MG tablet  2 times daily,   Status:  Discontinued        11/22/21 0841    doxycycline (VIBRAMYCIN) 100 MG capsule  2 times daily        11/22/21 0902              Tacy Learn, PA-C 11/22/21 2947    Teressa Lower, MD 11/22/21 1539

## 2021-11-24 DIAGNOSIS — L03011 Cellulitis of right finger: Secondary | ICD-10-CM | POA: Diagnosis not present

## 2021-11-26 LAB — CULTURE, BLOOD (ROUTINE X 2)
Culture: NO GROWTH
Culture: NO GROWTH
Special Requests: ADEQUATE

## 2021-11-27 DIAGNOSIS — L03011 Cellulitis of right finger: Secondary | ICD-10-CM | POA: Diagnosis not present

## 2021-11-28 ENCOUNTER — Other Ambulatory Visit: Payer: Self-pay

## 2021-11-28 ENCOUNTER — Ambulatory Visit (HOSPITAL_COMMUNITY)
Admission: RE | Admit: 2021-11-28 | Discharge: 2021-11-28 | Disposition: A | Discharge status: Home / Self Care | Payer: Medicare HMO | Source: Ambulatory Visit | Attending: Nephrology | Admitting: Nephrology

## 2021-11-28 VITALS — BP 128/56 | HR 68 | Temp 97.0°F | Resp 18

## 2021-11-28 DIAGNOSIS — N183 Chronic kidney disease, stage 3 unspecified: Secondary | ICD-10-CM | POA: Insufficient documentation

## 2021-11-28 DIAGNOSIS — D631 Anemia in chronic kidney disease: Secondary | ICD-10-CM | POA: Insufficient documentation

## 2021-11-28 LAB — IRON AND TIBC
Iron: 37 ug/dL (ref 28–170)
Saturation Ratios: 10 % — ABNORMAL LOW (ref 10.4–31.8)
TIBC: 379 ug/dL (ref 250–450)
UIBC: 342 ug/dL

## 2021-11-28 LAB — FERRITIN: Ferritin: 33 ng/mL (ref 11–307)

## 2021-11-28 LAB — POCT HEMOGLOBIN-HEMACUE: Hemoglobin: 8.9 g/dL — ABNORMAL LOW (ref 12.0–15.0)

## 2021-11-28 MED ORDER — DARBEPOETIN ALFA 100 MCG/0.5ML IJ SOSY
100.0000 ug | PREFILLED_SYRINGE | INTRAMUSCULAR | Status: DC
  Administered 2021-11-28: 100 ug via SUBCUTANEOUS

## 2021-11-28 MED ORDER — DARBEPOETIN ALFA 100 MCG/0.5ML IJ SOSY
PREFILLED_SYRINGE | INTRAMUSCULAR | Status: AC
  Filled 2021-11-28: qty 0.5

## 2021-12-12 NOTE — Progress Notes (Signed)
Advanced Heart Failure Clinic Note    HF Cardiologist: Dr Vaughan Browner Nephrologist: Dr Hollie Salk  HPI: Krista Mason is a 86 y.o. female with a history of HTN, HL, CKD 3, moderate mitral regurgitation, DM, and chronic systolic heart failure .  Admitted 4/19 with dyspnea. ECHO had gone down to 35-40% (previously 45%). Had RHC with normal filling pressures and cardiac output. Creatinine peaked at 2.1 so lasix was held for few days after discharge. Myeloma panel - 0.3 M protein noted. PYP scan was negative for TTR. Has seen Dr. Beryle Beams and not felt to have myeloma.   Has had CPX test 5/209 which showed - submax test with moderate to severe functional limitation due to HF, lung disease and deconditioning.    Echo 3/20 EF 35-40% moderate MR. RV ok Personally reviewed  Had GI bleed in 10/20. EGD with grade D esophagitis and stricture  Underwent MDT CRT-D on 07/24/19 subsequently found to have episodes of AF.  Amiodarone started.   Echo 3/21 EF 45-50% Personally reviewed  Diagnosed with Covid in early May. Admitted to hospital in 5/22 with acute on chronic anemia secondary to upper GI bleeding: EGD 02/28/21 per Dr. Michail Sermon noted severe distal ulcerated esophagitis w/ a stricture.  Eliquis held for 2 weeks. Discharged to SNF. Remains at [facility].   Follow up 12/22 Hgb 6.6 -> 7.2. Got 1u RBC and IV iron. EGD with esophageal ulcer and moderate stricture. Continued with iron infusions.  Echo 10/12/21 EF 45-50% moderate MR.   Today she returns for HF follow up with her daughter. Overall feeling fine. SOB off and on with walking. Taking extra torsemide more days than not during the week, can't seem to get weight below 145 lbs. Doubled dose for the past 3 days. Chronically sleeps reclined, feeling some palpations. Denies abnormal bleeding, CP, dizziness, edema, or PND/Orthopnea. Appetite ok. No fever or chills. Weight at home 145 pounds. Taking all medications. Eating more salty foods, ate salty soup  yesterday. [facility]    Cardiac studies:  - Echo (12/22): EF 45-50%, moderate MR  - CPX (5/19) FVC 1.97 (73%)      FEV1 1.24 (61%)        FEV1/FVC 63 (81%)        MVV 49 (59%)       Resting HR: 58 Peak HR: 110   (78% age predicted max HR) BP rest: 152/66 BP peak: 214/70 Peak VO2: 9.6 (57% predicted peak VO2) VE/VCO2 slope:  50 OUES: 0.87 Peak RER: 0.93 VE/MVV:  70% O2pulse:  6   (75% predicted O2pulse)   - PYP (4/19): scan negative for TTR  - RHC 4/19  RA = 3 RV = 29/3 PA = 25/12 (16) PCW = 8 Fick cardiac output/index = 5.3/3.0 PVR = 1.5 WU Ao sat = 97% PA sat = 65%, 67%   1. Normal filling pressures and outputs  - LHC 4/18  Heavily calcified but non-obstructive CAD. 50% RCA and 30% LAD.    - ECHO 4/19 EF 35% Severe concentric LVH with diffuse hypokinesis and paradoxical   septal motion. Thickened aortic and mitral valve leaflets. Mild   aortic regurgitation and moderate mitral regurgitation. Trivial   posteriorly located pericardial effusion.   Evaluation for cardiac amyloidosis should be considered .  - ECHO 2018, the LVEF is improved from   35-40% up to 40-45%. There is persistent moderate mitral   regurgitation.  Review of systems complete and found to be negative unless listed  in HPI.    SH:  Social History   Socioeconomic History   Marital status: Widowed    Spouse name: Not on file   Number of children: Not on file   Years of education: Not on file   Highest education level: Not on file  Occupational History   Not on file  Tobacco Use   Smoking status: Former    Packs/day: 0.75    Years: 60.00    Pack years: 45.00    Types: Cigarettes    Quit date: 11/04/2016    Years since quitting: 5.1   Smokeless tobacco: Never  Vaping Use   Vaping Use: Never used  Substance and Sexual Activity   Alcohol use: No   Drug use: No   Sexual activity: Not on file  Other Topics Concern   Not on file  Social History  Narrative   [facility] at [facility].   Social Determinants of Health   Financial Resource Strain: Not on file  Food Insecurity: Not on file  Transportation Needs: Not on file  Physical Activity: Not on file  Stress: Not on file  Social Connections: Not on file  Intimate Partner Violence: Not on file   FH:  Family History  Problem Relation Age of Onset   Heart attack Father    Heart disease Father    Cancer Father    Hypertension Father    Hypertension Brother    Stroke Brother    Hypertension Brother    Diabetes Son    Hyperlipidemia Son    Hypertension Son    Past Medical History:  Diagnosis Date   Anemia    Atrial fibrillation ([place])    Chronic combined systolic and diastolic CHF (congestive heart failure) ([place])    a. LV dysfcuntion felt to be related to PVCs   Chronic kidney disease    Diabetes mellitus without complication (HCC)    GERD (gastroesophageal reflux disease)    Hx of echocardiogram    Echo (1/16):  EF 60-65%, no RWMA, Gr 1 DD, mod AI, mod MR, mild LAE   Hyperkalemia    Hyperlipidemia    Hypertension    NICM (nonischemic cardiomyopathy) ([place])    a. 12/2018 Echo: EF 35%, diff HK w/ septal-lateral dyssynchrony. Nl RV fxn; b. 07/2019 s/p MDT [id number] [name] CRT-P MRI SureScan device (ser #: [id number])   Orthostatic hypotension    PVC (premature ventricular contraction)    a. s/p PVC ablation in 11/2016   Rheumatic fever    Wears dentures    Current Outpatient Medications  Medication Sig Dispense Refill   acetaminophen (TYLENOL) 500 MG tablet Take 1,000 mg by mouth every 6 (six) hours as needed for moderate pain, headache or mild pain.     amiodarone (PACERONE) 100 MG tablet Take 100 mg by mouth every other day.     atorvastatin (LIPITOR) 20 MG tablet Take 20 mg by mouth every other day.     carvedilol (COREG) 12.5 MG tablet TAKE ONE TABLET BY MOUTH TWICE DAILY WITH A MEAL 60 tablet 5   cholecalciferol (VITAMIN D3) 25 MCG (1000 UNIT)  tablet Take 2,000 Units by mouth every morning.     diphenhydrAMINE (BENADRYL) 25 mg capsule Take 25 mg by mouth every 6 (six) hours as needed (allergies/sleep).     ELIQUIS 2.5 MG TABS tablet TAKE ONE TABLET BY MOUTH TWICE DAILY 180 tablet 1   Ensure (ENSURE) Take 237 mLs by mouth daily.     hydrALAZINE (  APRESOLINE) 50 MG tablet TAKE ONE TABLET BY MOUTH TWICE DAILY 180 tablet 3   NOVAFERRUM 125 MG/5ML LIQD Take by mouth daily.     pantoprazole (PROTONIX) 40 MG tablet Take 40 mg by mouth in the morning and at bedtime.     Polyethyl Glycol-Propyl Glycol 0.4-0.3 % SOLN Place 1 drop into both eyes every 8 (eight) hours as needed (dryness).     polyethylene glycol (MIRALAX / GLYCOLAX) 17 g packet Take 17 g by mouth daily as needed (constipation.).     sucralfate (CARAFATE) 1 GM/10ML suspension Take 10 mLs (1 g total) by mouth 4 (four) times daily. 3600 mL 0   torsemide (DEMADEX) 20 MG tablet Take 1-2 tablets (20-40 mg total) by mouth See admin instructions. Take 1 tablet (20 mg) by mouth scheduled every morning & may take 2 tablets (40 mg) by mouth in the morning if experiencing fluid retention. 30 tablet 6   No current facility-administered medications for this encounter.   BP 140/60    Pulse 70    Ht 5\' 5"  (1.651 m)    Wt 66.7 kg    SpO2 100%    BMI 24.46 kg/m   Wt Readings from Last 3 Encounters:  12/13/21 66.7 kg  10/17/21 65.4 kg  10/12/21 67 kg   PHYSICAL EXAM: General:  NAD. No resp difficulty, elderly HEENT: Normal Neck: Supple. No JVD. Carotids 2+ bilat; no bruits. No lymphadenopathy or thryomegaly appreciated. Cor: PMI nondisplaced. Regular rate & rhythm. No rubs, gallops or murmurs. Lungs: Clear Abdomen: Soft, nontender, nondistended. No hepatosplenomegaly. No bruits or masses. Good bowel sounds. Extremities: No cyanosis, clubbing, rash, edema Neuro: Alert & oriented x 3, cranial nerves grossly intact. Moves all 4 extremities w/o difficulty. Affect pleasant.  ICD interrogation:  OptiVol slowly rising, has not crossed threshold, stable thoracic impedence, looks like short bursts of AT/AF but nothing sustained more than a couple minutes, >1 hour/day activity, >95% bi-V pacing (personally reviewed).  ECG: BiV pacing, with PVCs (personally reviewed).  ASSESSMENT & PLAN: 1. Chronic combined CHF: NICM s/p PVC ablation. Now with LBBB. Echo 8/18 EF 40% (unchanged from prior).  - Echo 01/2018 LVEF 35-40%, Moderate MR, LVH. Concern for amyloid.  - PYP scan negative for TTR amyloid. Has MGUS and has seen Dr. Beryle Beams but low suspicion for AL amyloid/myeloma. 24 hour urine with no Mspike - Echo 3/20 EF stable at 35-40% with moderate MR - CPX 5/19 poor efforts RER 0.9 but suggests severe multifactorial limitation but HF predominates. Unfortunately, not candidate for advanced therapies due to age and comorbidities. - s/p CRT-D on 07/24/19. ICD interrogation as above. - Echo 3/21 EF 45-50%.  - Echo 10/12/21 EF 45-50% moderate MR - much improved with CRT. NYHA III now in setting of severe anemia. Volume status mildly elevated by OptiVol and weight. - Increase torsemide to 40 mg am/20 mg pm x 3 days, then torsemide 40 mg daily. Can take extra as needed to keep weight 141 or less. - Will ask device RN to send interrogation in 7-10 days to follow fluid. - Continue carvedilol 12.5 mg bid. - Continue hydralazine 50 bid. - No ACE/ARB/ARNI/spiro/SGLT2i with CKD IV - BMET/BNP today, repeat BMET in 2 weeks at next iron infusion.  2. CAD - Last cath 01/2017 with heavily calcified but non-obstructive CAD. 50% RCA and 30% LAD.  - No current angina. - CT chest stable with old granulomatous disease. - Continue ASA and statin.  3. PVCs - s/p ablation. On low-dose amiodarone  therapy. PVC burden low. - ECG with PVCs today. - Has felt some palpitations.   4. LBBB - EF 35-40% on echo 3/20 + moderate MR. LBBB ~145ms. - s/p CRT 07/24/19.  - EF 45-50% on echo 3/21 and 10/12/21.  6. CKD IV -  Baseline creatinine 1.7-1.9.  - Last value 1.98 on 11/21/21. - Following with Dr Hollie Salk. - Check labs today.  7. HTN - Elevated today. - Diurese as above. - Consider increasing beta blocker next.  8. PAD - ABIs showed moderate disease BLE. Has seen [facility]. Medical therapy for now. No change.   9. PAF - Quiescent on amio.   10. Iron-deficient anemia with recurrent GI bleeding - Recent EGD with esophageal ulcer - Getting feraheme and RBC transfusions per GI and PCP. - Ideally keep hgb > 8.0, last Hgb 8.9 on 11/28/21  Follow up in 6 months with Dr. Haroldine Laws.  Rafael Bihari, FNP  2:26 PM

## 2021-12-13 ENCOUNTER — Ambulatory Visit (HOSPITAL_COMMUNITY)
Admission: RE | Admit: 2021-12-13 | Discharge: 2021-12-13 | Disposition: A | Discharge status: Home / Self Care | Payer: Medicare HMO | Source: Ambulatory Visit | Attending: Internal Medicine | Admitting: Internal Medicine

## 2021-12-13 ENCOUNTER — Encounter (HOSPITAL_COMMUNITY): Payer: Self-pay

## 2021-12-13 ENCOUNTER — Other Ambulatory Visit: Payer: Self-pay

## 2021-12-13 VITALS — BP 140/60 | HR 70 | Ht 65.0 in | Wt 147.0 lb

## 2021-12-13 DIAGNOSIS — Z8616 Personal history of COVID-19: Secondary | ICD-10-CM | POA: Diagnosis not present

## 2021-12-13 DIAGNOSIS — I5042 Chronic combined systolic (congestive) and diastolic (congestive) heart failure: Secondary | ICD-10-CM | POA: Insufficient documentation

## 2021-12-13 DIAGNOSIS — Z7982 Long term (current) use of aspirin: Secondary | ICD-10-CM | POA: Insufficient documentation

## 2021-12-13 DIAGNOSIS — I493 Ventricular premature depolarization: Secondary | ICD-10-CM | POA: Insufficient documentation

## 2021-12-13 DIAGNOSIS — I2510 Atherosclerotic heart disease of native coronary artery without angina pectoris: Secondary | ICD-10-CM | POA: Diagnosis not present

## 2021-12-13 DIAGNOSIS — E1151 Type 2 diabetes mellitus with diabetic peripheral angiopathy without gangrene: Secondary | ICD-10-CM | POA: Insufficient documentation

## 2021-12-13 DIAGNOSIS — I10 Essential (primary) hypertension: Secondary | ICD-10-CM | POA: Diagnosis not present

## 2021-12-13 DIAGNOSIS — I340 Nonrheumatic mitral (valve) insufficiency: Secondary | ICD-10-CM | POA: Diagnosis not present

## 2021-12-13 DIAGNOSIS — D500 Iron deficiency anemia secondary to blood loss (chronic): Secondary | ICD-10-CM | POA: Diagnosis not present

## 2021-12-13 DIAGNOSIS — K2210 Ulcer of esophagus without bleeding: Secondary | ICD-10-CM | POA: Insufficient documentation

## 2021-12-13 DIAGNOSIS — R002 Palpitations: Secondary | ICD-10-CM | POA: Insufficient documentation

## 2021-12-13 DIAGNOSIS — I428 Other cardiomyopathies: Secondary | ICD-10-CM | POA: Insufficient documentation

## 2021-12-13 DIAGNOSIS — Z7901 Long term (current) use of anticoagulants: Secondary | ICD-10-CM | POA: Insufficient documentation

## 2021-12-13 DIAGNOSIS — I130 Hypertensive heart and chronic kidney disease with heart failure and stage 1 through stage 4 chronic kidney disease, or unspecified chronic kidney disease: Secondary | ICD-10-CM | POA: Insufficient documentation

## 2021-12-13 DIAGNOSIS — I447 Left bundle-branch block, unspecified: Secondary | ICD-10-CM | POA: Insufficient documentation

## 2021-12-13 DIAGNOSIS — I739 Peripheral vascular disease, unspecified: Secondary | ICD-10-CM | POA: Diagnosis not present

## 2021-12-13 DIAGNOSIS — K2100 Gastro-esophageal reflux disease with esophagitis, without bleeding: Secondary | ICD-10-CM | POA: Diagnosis not present

## 2021-12-13 DIAGNOSIS — I3139 Other pericardial effusion (noninflammatory): Secondary | ICD-10-CM | POA: Insufficient documentation

## 2021-12-13 DIAGNOSIS — Z95810 Presence of automatic (implantable) cardiac defibrillator: Secondary | ICD-10-CM | POA: Insufficient documentation

## 2021-12-13 DIAGNOSIS — N184 Chronic kidney disease, stage 4 (severe): Secondary | ICD-10-CM

## 2021-12-13 DIAGNOSIS — J984 Other disorders of lung: Secondary | ICD-10-CM | POA: Diagnosis not present

## 2021-12-13 DIAGNOSIS — I480 Paroxysmal atrial fibrillation: Secondary | ICD-10-CM

## 2021-12-13 DIAGNOSIS — E1122 Type 2 diabetes mellitus with diabetic chronic kidney disease: Secondary | ICD-10-CM | POA: Diagnosis not present

## 2021-12-13 DIAGNOSIS — I5022 Chronic systolic (congestive) heart failure: Secondary | ICD-10-CM | POA: Diagnosis not present

## 2021-12-13 DIAGNOSIS — Z79899 Other long term (current) drug therapy: Secondary | ICD-10-CM | POA: Insufficient documentation

## 2021-12-13 DIAGNOSIS — D472 Monoclonal gammopathy: Secondary | ICD-10-CM | POA: Diagnosis not present

## 2021-12-13 DIAGNOSIS — E785 Hyperlipidemia, unspecified: Secondary | ICD-10-CM | POA: Insufficient documentation

## 2021-12-13 LAB — BASIC METABOLIC PANEL
Anion gap: 10 (ref 5–15)
BUN: 42 mg/dL — ABNORMAL HIGH (ref 8–23)
CO2: 26 mmol/L (ref 22–32)
Calcium: 9.5 mg/dL (ref 8.9–10.3)
Chloride: 96 mmol/L — ABNORMAL LOW (ref 98–111)
Creatinine, Ser: 2.05 mg/dL — ABNORMAL HIGH (ref 0.44–1.00)
GFR, Estimated: 24 mL/min — ABNORMAL LOW (ref >60)
Glucose, Bld: 173 mg/dL — ABNORMAL HIGH (ref 70–99)
Potassium: 4.1 mmol/L (ref 3.5–5.1)
Sodium: 132 mmol/L — ABNORMAL LOW (ref 135–145)

## 2021-12-13 LAB — BRAIN NATRIURETIC PEPTIDE: B Natriuretic Peptide: 102.6 pg/mL — ABNORMAL HIGH (ref 0.0–100.0)

## 2021-12-13 MED ORDER — TORSEMIDE 20 MG PO TABS: ORAL_TABLET | ORAL | 3 refills | Status: DC

## 2021-12-13 NOTE — Patient Instructions (Signed)
INCREASE Torsemide to 40 mg in the AM and 20 mg in the PM for 3 days and 40  daily thereafter  Labs today We will only contact you if something comes back abnormal or we need to make some changes. Otherwise no news is good news!  Labs needed in 7-10 days (ok to have do at next infusion)   Your physician recommends that you schedule a follow-up appointment in: 6 months with Dr Haroldine Laws  Do the following things EVERYDAY: Weigh yourself in the morning before breakfast. Write it down and keep it in a log. Take your medicines as prescribed Eat low salt foods--Limit salt (sodium) to 2000 mg per day.  Stay as active as you can everyday Limit all fluids for the day to less than 2 liters  At the [facility], you and your health needs are our priority. As part of our continuing mission to provide you with exceptional heart care, we have created designated Provider Care Teams. These Care Teams include your primary Cardiologist (physician) and Advanced Practice Providers (APPs- Physician Assistants and Nurse Practitioners) who all work together to provide you with the care you need, when you need it.   You may see any of the following providers on your designated Care Team at your next follow up: Dr Glori Bickers Dr Haynes Kerns, NP Lyda Jester, [place] [facility] [place], [place] Audry Riles, PharmD   Please be sure to bring in all your medications bottles to every appointment.   If you have any questions or concerns before your next appointment please send [place] a message through [place] or call our office at [phone].    TO LEAVE A MESSAGE FOR THE NURSE SELECT OPTION 2, PLEASE LEAVE A MESSAGE INCLUDING: YOUR NAME DATE OF BIRTH CALL BACK NUMBER REASON FOR CALL**this is important as we prioritize the call backs  YOU WILL RECEIVE A CALL BACK THE SAME DAY AS Krista Mason AS YOU CALL BEFORE 4:00 PM

## 2021-12-19 ENCOUNTER — Other Ambulatory Visit: Payer: Self-pay | Admitting: Gastroenterology

## 2021-12-20 ENCOUNTER — Telehealth: Payer: Self-pay | Admitting: *Deleted

## 2021-12-20 NOTE — Telephone Encounter (Signed)
? ?  Pre-operative Risk Assessment  ?  ?Patient Name: Krista Mason  ?DOB: [birth year] ?MRN: [id number]  ? ?  ? ?Request for Surgical Clearance   ? ?Procedure:   ENDOSCOPY ? ?Date of Surgery:  Clearance 03/20/22                              ?   ?Surgeon:  DR. Alessandra Bevels ?Surgeon's Group or Practice Name:  [facility] ?Phone number:  [phone] ?Fax number:  [phone] ?  ?Type of Clearance Requested:   ?- Medical  ?- Pharmacy:  Hold Apixaban (Eliquis) x 4 DAYS PRIOR ?  ?Type of Anesthesia:   PROPOFOL ?  ?Additional requests/questions:   ? ?Signed, ?Julaine Hua   ?12/20/2021, 11:52 AM  ? ?

## 2021-12-22 NOTE — Telephone Encounter (Signed)
Patient with diagnosis of afib on Eliquis for anticoagulation.   ? ?Procedure: endoscopy ?Date of procedure: 03/20/22 ? ?CHA2DS2-VASc Score = 7  ?This indicates a 11.2% annual risk of stroke. ?The patient's score is based upon: ?CHF History: 1 ?HTN History: 1 ?Diabetes History: 1 ?Stroke History: 0 ?Vascular Disease History: 1 ?Age Score: 2 ?Gender Score: 1 ?  ?CrCl 36mL/min ?Platelet count 228K ? ?Per office protocol, patient can hold Eliquis for 2 days prior to procedure.   ?

## 2021-12-22 NOTE — Telephone Encounter (Signed)
? ?  Primary Cardiologist: Glori Bickers, MD ? ?Chart reviewed as part of pre-operative protocol coverage. Given past medical history and time since last visit, based on ACC/AHA guidelines, [place] would be at acceptable risk for the planned procedure without further cardiovascular testing.  ? ?Patient with diagnosis of afib on Eliquis for anticoagulation.   ?  ?Procedure: endoscopy ?Date of procedure: 03/20/22 ?  ?CHA2DS2-VASc Score = 7  ?This indicates a 11.2% annual risk of stroke. ?The patient's score is based upon: ?CHF History: 1 ?HTN History: 1 ?Diabetes History: 1 ?Stroke History: 0 ?Vascular Disease History: 1 ?Age Score: 2 ?Gender Score: 1 ?  ?CrCl 76mL/min ?Platelet count 228K ?  ?Per office protocol, patient can hold Eliquis for 2 days prior to procedure.  ? ?I will route this recommendation to the requesting party via Epic fax function and remove from pre-op pool. ? ?Please call with questions. ? ?Jossie Ng. Louanne Calvillo NP-C ? ?  ?12/22/2021, 8:35 AM ?[place] ?[address] ?Office [phone] Fax [phone] ? ? ? ? ?

## 2021-12-25 ENCOUNTER — Ambulatory Visit (INDEPENDENT_AMBULATORY_CARE_PROVIDER_SITE_OTHER): Payer: Medicare HMO

## 2021-12-25 DIAGNOSIS — Z950 Presence of cardiac pacemaker: Secondary | ICD-10-CM | POA: Diagnosis not present

## 2021-12-25 DIAGNOSIS — I5022 Chronic systolic (congestive) heart failure: Secondary | ICD-10-CM

## 2021-12-25 NOTE — Progress Notes (Signed)
EPIC Encounter for ICM Monitoring ? ?Patient Name: Krista Mason is a 86 y.o. female ?Date: 12/25/2021 ?Primary Care Physican: [facility] [place] [facility] [place] ?Primary Cardiologist: [place] ?Electrophysiologist: Camnitz ?Bi-V Pacing: 91.3%  ?12/13/2021 Weight: 147 lbs  ?     ? ?Transmission reviewed.  One time ICM check for HF clinic.  ?  ?Optivol thoracic impedance trending slightly below baseline normal.  ? ?Prescribed:  ?Torsemide 20 mg take 2 tablets (40 mg total) by mouth daily. ? ?Recommendations: Copy sent to Allena Katz, NP at [facility] clinic for review as requested. ? ?Follow-up plan: No ICM clinic phone appointments scheduled at this time.   91 day device clinic remote transmission 01/30/2022.   ? ?EP/Cardiology Office Visits: None ? ?Copy of ICM check sent to Dr. Curt Bears.  ? ?3 month ICM trend: 12/25/2021. ? ? ? ?12-14 Month ICM trend:  ? ? ? ?Rosalene Billings, RN ?12/25/2021 ?3:25 PM ? ?

## 2021-12-26 ENCOUNTER — Other Ambulatory Visit: Payer: Self-pay

## 2021-12-26 ENCOUNTER — Inpatient Hospital Stay (HOSPITAL_COMMUNITY)
Admission: EM | Admit: 2021-12-26 | Discharge: 2021-12-29 | DRG: 381 | Disposition: A | Discharge status: Home / Self Care | Payer: Medicare HMO | Attending: Internal Medicine | Admitting: Internal Medicine

## 2021-12-26 ENCOUNTER — Encounter (HOSPITAL_COMMUNITY): Payer: Self-pay | Admitting: Internal Medicine

## 2021-12-26 ENCOUNTER — Other Ambulatory Visit (HOSPITAL_COMMUNITY): Payer: Self-pay

## 2021-12-26 ENCOUNTER — Encounter (HOSPITAL_COMMUNITY)
Admission: RE | Admit: 2021-12-26 | Discharge: 2021-12-26 | Disposition: A | Discharge status: Home / Self Care | Payer: Medicare HMO | Source: Ambulatory Visit | Attending: Nephrology | Admitting: Nephrology

## 2021-12-26 VITALS — BP 134/49 | HR 85 | Temp 97.0°F | Resp 18

## 2021-12-26 DIAGNOSIS — E782 Mixed hyperlipidemia: Secondary | ICD-10-CM | POA: Diagnosis present

## 2021-12-26 DIAGNOSIS — Z8249 Family history of ischemic heart disease and other diseases of the circulatory system: Secondary | ICD-10-CM

## 2021-12-26 DIAGNOSIS — E118 Type 2 diabetes mellitus with unspecified complications: Secondary | ICD-10-CM | POA: Diagnosis present

## 2021-12-26 DIAGNOSIS — D62 Acute posthemorrhagic anemia: Secondary | ICD-10-CM | POA: Diagnosis present

## 2021-12-26 DIAGNOSIS — N183 Chronic kidney disease, stage 3 unspecified: Secondary | ICD-10-CM

## 2021-12-26 DIAGNOSIS — I5042 Chronic combined systolic (congestive) and diastolic (congestive) heart failure: Secondary | ICD-10-CM | POA: Diagnosis present

## 2021-12-26 DIAGNOSIS — Z887 Allergy status to serum and vaccine status: Secondary | ICD-10-CM

## 2021-12-26 DIAGNOSIS — D649 Anemia, unspecified: Secondary | ICD-10-CM | POA: Diagnosis present

## 2021-12-26 DIAGNOSIS — I5022 Chronic systolic (congestive) heart failure: Secondary | ICD-10-CM

## 2021-12-26 DIAGNOSIS — Z20822 Contact with and (suspected) exposure to covid-19: Secondary | ICD-10-CM | POA: Diagnosis present

## 2021-12-26 DIAGNOSIS — K222 Esophageal obstruction: Secondary | ICD-10-CM | POA: Diagnosis present

## 2021-12-26 DIAGNOSIS — E876 Hypokalemia: Secondary | ICD-10-CM | POA: Diagnosis present

## 2021-12-26 DIAGNOSIS — E871 Hypo-osmolality and hyponatremia: Secondary | ICD-10-CM | POA: Diagnosis present

## 2021-12-26 DIAGNOSIS — Z79899 Other long term (current) drug therapy: Secondary | ICD-10-CM

## 2021-12-26 DIAGNOSIS — I480 Paroxysmal atrial fibrillation: Secondary | ICD-10-CM | POA: Diagnosis present

## 2021-12-26 DIAGNOSIS — K922 Gastrointestinal hemorrhage, unspecified: Secondary | ICD-10-CM | POA: Diagnosis not present

## 2021-12-26 DIAGNOSIS — N1831 Chronic kidney disease, stage 3a: Secondary | ICD-10-CM

## 2021-12-26 DIAGNOSIS — Z833 Family history of diabetes mellitus: Secondary | ICD-10-CM

## 2021-12-26 DIAGNOSIS — Z823 Family history of stroke: Secondary | ICD-10-CM

## 2021-12-26 DIAGNOSIS — Z809 Family history of malignant neoplasm, unspecified: Secondary | ICD-10-CM

## 2021-12-26 DIAGNOSIS — Z87891 Personal history of nicotine dependence: Secondary | ICD-10-CM

## 2021-12-26 DIAGNOSIS — I10 Essential (primary) hypertension: Secondary | ICD-10-CM | POA: Diagnosis present

## 2021-12-26 DIAGNOSIS — M25559 Pain in unspecified hip: Secondary | ICD-10-CM

## 2021-12-26 DIAGNOSIS — E1122 Type 2 diabetes mellitus with diabetic chronic kidney disease: Secondary | ICD-10-CM | POA: Diagnosis present

## 2021-12-26 DIAGNOSIS — Z7901 Long term (current) use of anticoagulants: Secondary | ICD-10-CM

## 2021-12-26 DIAGNOSIS — E1169 Type 2 diabetes mellitus with other specified complication: Secondary | ICD-10-CM | POA: Diagnosis present

## 2021-12-26 DIAGNOSIS — N179 Acute kidney failure, unspecified: Secondary | ICD-10-CM | POA: Diagnosis present

## 2021-12-26 DIAGNOSIS — Z880 Allergy status to penicillin: Secondary | ICD-10-CM

## 2021-12-26 DIAGNOSIS — Z66 Do not resuscitate: Secondary | ICD-10-CM | POA: Diagnosis present

## 2021-12-26 DIAGNOSIS — K2211 Ulcer of esophagus with bleeding: Principal | ICD-10-CM | POA: Diagnosis present

## 2021-12-26 DIAGNOSIS — Z885 Allergy status to narcotic agent status: Secondary | ICD-10-CM

## 2021-12-26 DIAGNOSIS — I130 Hypertensive heart and chronic kidney disease with heart failure and stage 1 through stage 4 chronic kidney disease, or unspecified chronic kidney disease: Secondary | ICD-10-CM | POA: Diagnosis present

## 2021-12-26 DIAGNOSIS — E1151 Type 2 diabetes mellitus with diabetic peripheral angiopathy without gangrene: Secondary | ICD-10-CM | POA: Diagnosis present

## 2021-12-26 DIAGNOSIS — N184 Chronic kidney disease, stage 4 (severe): Secondary | ICD-10-CM | POA: Diagnosis present

## 2021-12-26 DIAGNOSIS — D509 Iron deficiency anemia, unspecified: Secondary | ICD-10-CM | POA: Diagnosis present

## 2021-12-26 DIAGNOSIS — K219 Gastro-esophageal reflux disease without esophagitis: Secondary | ICD-10-CM | POA: Diagnosis present

## 2021-12-26 DIAGNOSIS — I428 Other cardiomyopathies: Secondary | ICD-10-CM | POA: Diagnosis present

## 2021-12-26 HISTORY — DX: Chronic kidney disease, stage 4 (severe): N18.4

## 2021-12-26 LAB — CBC WITH DIFFERENTIAL/PLATELET
Abs Immature Granulocytes: 0.04 10*3/uL (ref 0.00–0.07)
Basophils Absolute: 0 10*3/uL (ref 0.0–0.1)
Basophils Relative: 0 %
Eosinophils Absolute: 0 10*3/uL (ref 0.0–0.5)
Eosinophils Relative: 0 %
HCT: 25.3 % — ABNORMAL LOW (ref 36.0–46.0)
Hemoglobin: 7.6 g/dL — ABNORMAL LOW (ref 12.0–15.0)
Immature Granulocytes: 1 %
Lymphocytes Relative: 22 %
Lymphs Abs: 1.4 10*3/uL (ref 0.7–4.0)
MCH: 26.5 pg (ref 26.0–34.0)
MCHC: 30 g/dL (ref 30.0–36.0)
MCV: 88.2 fL (ref 80.0–100.0)
Monocytes Absolute: 0.5 10*3/uL (ref 0.1–1.0)
Monocytes Relative: 8 %
Neutro Abs: 4.7 10*3/uL (ref 1.7–7.7)
Neutrophils Relative %: 69 %
Platelets: 270 10*3/uL (ref 150–400)
RBC: 2.87 MIL/uL — ABNORMAL LOW (ref 3.87–5.11)
RDW: 17.8 % — ABNORMAL HIGH (ref 11.5–15.5)
WBC: 6.7 10*3/uL (ref 4.0–10.5)
nRBC: 0 % (ref 0.0–0.2)

## 2021-12-26 LAB — RESP PANEL BY RT-PCR (FLU A&B, COVID) ARPGX2
Influenza A by PCR: NEGATIVE
Influenza B by PCR: NEGATIVE
SARS Coronavirus 2 by RT PCR: NEGATIVE

## 2021-12-26 LAB — CBG MONITORING, ED: Glucose-Capillary: 155 mg/dL — ABNORMAL HIGH (ref 70–99)

## 2021-12-26 LAB — BASIC METABOLIC PANEL
Anion gap: 9 (ref 5–15)
BUN: 47 mg/dL — ABNORMAL HIGH (ref 8–23)
CO2: 26 mmol/L (ref 22–32)
Calcium: 9.5 mg/dL (ref 8.9–10.3)
Chloride: 97 mmol/L — ABNORMAL LOW (ref 98–111)
Creatinine, Ser: 2.2 mg/dL — ABNORMAL HIGH (ref 0.44–1.00)
GFR, Estimated: 22 mL/min — ABNORMAL LOW (ref >60)
Glucose, Bld: 272 mg/dL — ABNORMAL HIGH (ref 70–99)
Potassium: 4.1 mmol/L (ref 3.5–5.1)
Sodium: 132 mmol/L — ABNORMAL LOW (ref 135–145)

## 2021-12-26 LAB — BRAIN NATRIURETIC PEPTIDE: B Natriuretic Peptide: 279 pg/mL — ABNORMAL HIGH (ref 0.0–100.0)

## 2021-12-26 LAB — PREPARE RBC (CROSSMATCH)

## 2021-12-26 LAB — HEMOGLOBIN A1C
Hgb A1c MFr Bld: 7.4 % — ABNORMAL HIGH (ref 4.8–5.6)
Mean Plasma Glucose: 165.68 mg/dL

## 2021-12-26 LAB — POCT HEMOGLOBIN-HEMACUE: Hemoglobin: 7.4 g/dL — ABNORMAL LOW (ref 12.0–15.0)

## 2021-12-26 LAB — POC OCCULT BLOOD, ED: Fecal Occult Bld: POSITIVE — AB

## 2021-12-26 LAB — FERRITIN: Ferritin: 10 ng/mL — ABNORMAL LOW (ref 11–307)

## 2021-12-26 LAB — IRON AND TIBC
Iron: 18 ug/dL — ABNORMAL LOW (ref 28–170)
Saturation Ratios: 4 % — ABNORMAL LOW (ref 10.4–31.8)
TIBC: 435 ug/dL (ref 250–450)
UIBC: 417 ug/dL

## 2021-12-26 MED ORDER — ATORVASTATIN CALCIUM 10 MG PO TABS
20.0000 mg | ORAL_TABLET | ORAL | Status: DC
  Administered 2021-12-28: 20 mg via ORAL
  Filled 2021-12-26 (×2): qty 2

## 2021-12-26 MED ORDER — ACETAMINOPHEN 500 MG PO TABS
1000.0000 mg | ORAL_TABLET | Freq: Once | ORAL | Status: AC
  Administered 2021-12-26: 1000 mg via ORAL
  Filled 2021-12-26: qty 2

## 2021-12-26 MED ORDER — INSULIN ASPART 100 UNIT/ML IJ SOLN: 0.0000 [IU] | Freq: Every day | INTRAMUSCULAR | Status: DC

## 2021-12-26 MED ORDER — SODIUM CHLORIDE 0.9 % IV SOLN
10.0000 mL/h | Freq: Once | INTRAVENOUS | Status: AC
  Administered 2021-12-26: 10 mL/h via INTRAVENOUS

## 2021-12-26 MED ORDER — DARBEPOETIN ALFA 100 MCG/0.5ML IJ SOSY
100.0000 ug | PREFILLED_SYRINGE | INTRAMUSCULAR | Status: DC
  Administered 2021-12-26: 100 ug via SUBCUTANEOUS

## 2021-12-26 MED ORDER — DICLOFENAC SODIUM 1 % EX GEL
2.0000 g | Freq: Once | CUTANEOUS | Status: DC
  Filled 2021-12-26: qty 100

## 2021-12-26 MED ORDER — POLYETHYL GLYCOL-PROPYL GLYCOL 0.4-0.3 % OP SOLN
1.0000 [drp] | Freq: Three times a day (TID) | OPHTHALMIC | Status: DC | PRN
Start: 2021-12-26 — End: 2021-12-26

## 2021-12-26 MED ORDER — PANTOPRAZOLE INFUSION (NEW) - SIMPLE MED
8.0000 mg/h | INTRAVENOUS | Status: DC
  Administered 2021-12-26 – 2021-12-28 (×2): 8 mg/h via INTRAVENOUS
  Filled 2021-12-26 (×2): qty 100
  Filled 2021-12-26 (×2): qty 80

## 2021-12-26 MED ORDER — HYDRALAZINE HCL 50 MG PO TABS
50.0000 mg | ORAL_TABLET | Freq: Two times a day (BID) | ORAL | Status: DC
  Administered 2021-12-26 – 2021-12-29 (×5): 50 mg via ORAL
  Filled 2021-12-26 (×5): qty 1

## 2021-12-26 MED ORDER — LACTATED RINGERS IV SOLN: INTRAVENOUS | Status: DC

## 2021-12-26 MED ORDER — ONDANSETRON HCL 4 MG/2ML IJ SOLN: 4.0000 mg | Freq: Four times a day (QID) | INTRAMUSCULAR | Status: DC | PRN

## 2021-12-26 MED ORDER — SODIUM CHLORIDE 0.9% FLUSH
3.0000 mL | Freq: Two times a day (BID) | INTRAVENOUS | Status: DC
  Administered 2021-12-26 – 2021-12-29 (×4): 3 mL via INTRAVENOUS

## 2021-12-26 MED ORDER — SUCRALFATE 1 GM/10ML PO SUSP
1.0000 g | Freq: Four times a day (QID) | ORAL | Status: DC
  Administered 2021-12-26 – 2021-12-29 (×8): 1 g via ORAL
  Filled 2021-12-26 (×9): qty 10

## 2021-12-26 MED ORDER — DARBEPOETIN ALFA 100 MCG/0.5ML IJ SOSY
PREFILLED_SYRINGE | INTRAMUSCULAR | Status: AC
  Filled 2021-12-26: qty 0.5

## 2021-12-26 MED ORDER — POLYVINYL ALCOHOL 1.4 % OP SOLN
1.0000 [drp] | Freq: Three times a day (TID) | OPHTHALMIC | Status: DC | PRN
  Filled 2021-12-26: qty 15

## 2021-12-26 MED ORDER — PANTOPRAZOLE 80MG IVPB - SIMPLE MED
80.0000 mg | Freq: Once | INTRAVENOUS | Status: AC
Start: 2021-12-26 — End: 2021-12-26
  Administered 2021-12-26: 80 mg via INTRAVENOUS
  Filled 2021-12-26: qty 80

## 2021-12-26 MED ORDER — ACETAMINOPHEN 325 MG PO TABS
650.0000 mg | ORAL_TABLET | Freq: Four times a day (QID) | ORAL | Status: DC | PRN
  Administered 2021-12-26 – 2021-12-27 (×2): 650 mg via ORAL
  Filled 2021-12-26 (×3): qty 2

## 2021-12-26 MED ORDER — TORSEMIDE 20 MG PO TABS
40.0000 mg | ORAL_TABLET | Freq: Every day | ORAL | Status: DC
  Administered 2021-12-27 – 2021-12-29 (×3): 40 mg via ORAL
  Filled 2021-12-26 (×3): qty 2

## 2021-12-26 MED ORDER — PANTOPRAZOLE SODIUM 40 MG IV SOLR: 40.0000 mg | Freq: Two times a day (BID) | INTRAVENOUS | Status: DC

## 2021-12-26 MED ORDER — ACETAMINOPHEN 650 MG RE SUPP: 650.0000 mg | Freq: Four times a day (QID) | RECTAL | Status: DC | PRN

## 2021-12-26 MED ORDER — CARVEDILOL 12.5 MG PO TABS
12.5000 mg | ORAL_TABLET | Freq: Two times a day (BID) | ORAL | Status: DC
Start: 2021-12-26 — End: 2021-12-29
  Administered 2021-12-26 – 2021-12-29 (×5): 12.5 mg via ORAL
  Filled 2021-12-26: qty 4
  Filled 2021-12-26 (×4): qty 1

## 2021-12-26 MED ORDER — AMIODARONE HCL 200 MG PO TABS
100.0000 mg | ORAL_TABLET | ORAL | Status: DC
  Administered 2021-12-28: 100 mg via ORAL
  Filled 2021-12-26: qty 1

## 2021-12-26 MED ORDER — ONDANSETRON HCL 4 MG PO TABS
4.0000 mg | ORAL_TABLET | Freq: Four times a day (QID) | ORAL | Status: DC | PRN
  Filled 2021-12-26: qty 1

## 2021-12-26 MED ORDER — HYDRALAZINE HCL 20 MG/ML IJ SOLN: 5.0000 mg | INTRAMUSCULAR | Status: DC | PRN

## 2021-12-26 MED ORDER — INSULIN ASPART 100 UNIT/ML IJ SOLN
0.0000 [IU] | Freq: Three times a day (TID) | INTRAMUSCULAR | Status: DC
  Administered 2021-12-27: 2 [IU] via SUBCUTANEOUS
  Administered 2021-12-27 – 2021-12-28 (×4): 1 [IU] via SUBCUTANEOUS
  Administered 2021-12-28: 3 [IU] via SUBCUTANEOUS
  Administered 2021-12-29 (×2): 2 [IU] via SUBCUTANEOUS

## 2021-12-26 NOTE — Progress Notes (Addendum)
Notified Terri at [facility] of Hgb 7.4, patient states she is very tired, SOB, not feeling good. Per Dr. Hollie Salk patient can receive injection as ordered then go to ED for blood Transfusion. Patient transported to ED.  ?

## 2021-12-26 NOTE — Assessment & Plan Note (Addendum)
Hgb stable 

## 2021-12-26 NOTE — ED Triage Notes (Signed)
Pt. Stated, Im here cause I usually get injections but my HGB was too low and I need a blood transfusion. I only have weakness, SOB and no energy which has been going on since Ive been getting the injections. ?

## 2021-12-26 NOTE — Assessment & Plan Note (Signed)
-  Continue Lipitor °

## 2021-12-26 NOTE — Assessment & Plan Note (Addendum)
-  Rate controlled with Coreg, Amiodarone ?-s/p EGD ?-resume eliquis on Sunday ?

## 2021-12-26 NOTE — Assessment & Plan Note (Deleted)
-  I have discussed code status with the patient and her daughter and  they are in agreement that the patient would not desire resuscitation and would prefer to die a natural death should that situation arise. ?-She will need a gold out of facility DNR form at the time of discharge ?

## 2021-12-26 NOTE — Assessment & Plan Note (Signed)
-  Continue home Coreg, hydralazine ?

## 2021-12-26 NOTE — ED Provider Triage Note (Signed)
Emergency Medicine Provider Triage Evaluation Note ? ?Krista Mason , a 86 y.o. female  was evaluated in triage.  Pt complains of low hemoglobin.  Patient was sent up from the infusion clinic for low hemoglobin and for blood transfusion.  Patient goes to the infusion clinic about once a month for iron injections/transfusions.  She most recently had a blood transfusion back in December.  She states that she has had no worsening symptoms, has persistent weakness and shortness of breath. ? ?Review of Systems  ?Positive: Chronic weakness and shortness of breath ?Negative: Chest pain, syncope ? ?Physical Exam  ?BP (!) 106/59 (BP Location: Left Arm)   Pulse 87   Temp 99 ?F (37.2 ?C) (Oral)   Resp 18   SpO2 100%  ?Gen:   Awake, no distress   ?Resp:  Normal effort  ?MSK:   Moves extremities without difficulty  ?Other:  ? ?Medical Decision Making  ?Medically screening exam initiated at 11:44 AM.  Appropriate orders placed.  Krista Mason was informed that the remainder of the evaluation will be completed by another provider, this initial triage assessment does not replace that evaluation, and the importance of remaining in the ED until their evaluation is complete. ? ?Upon looking at the patient's chart, I cannot see hemoglobin level.  She does have low iron to 18, normal TIBC, and ferritin of 10. ?  ?Yancey Pedley T, PA-C ?12/26/21 1146 ? ?

## 2021-12-26 NOTE — Assessment & Plan Note (Addendum)
-  A1c: 7.4 ?-She does not appear to be taking medications for this issue currently ?-Cover with sensitive-scale SSI ?

## 2021-12-26 NOTE — Progress Notes (Signed)
Orders Placed This Encounter  ?Procedures  ? B Nat Peptide  ?  Standing Status:   Future  ?  Standing Expiration Date:   12/27/2022  ?  Order Specific Question:   Release to patient  ?  Answer:   Immediate  ? Basic metabolic panel  ?  Standing Status:   Future  ?  Standing Expiration Date:   12/27/2022  ?  Order Specific Question:   Release to patient  ?  Answer:   Immediate  ? ? ?

## 2021-12-26 NOTE — Assessment & Plan Note (Addendum)
-  09/2021 echo with ER 50-50% and grade 1 DD ?-Appears to be compensated at this time ?

## 2021-12-26 NOTE — H&P (Addendum)
History and Physical    Patient: LAYTEN KLEPP ZOX:[id number] DOB: [birth year] DOA: 12/26/2021 DOS: the patient was seen and examined on 12/26/2021 PCP: [facility] [place]  Patient coming from: [facility]; NOK: Daughter, [phone]   Chief Complaint: Symptomatic anemia  HPI: Krista Mason is a 86 y.o. female with medical history significant of anemia; afib; chronic combined CHF; DM; HTN; and HLD presenting with symptomatic anemia (sent by infusion clinic).  She has a h/o esophagitis/ulcer and esophageal stricture with recurrent anemia.  She receives infusions from the infusion clinic and she received one today and then was sent by Dr. Signe Colt for blood transfusion.  She gets iron infusions or shots monthly for low Hgb.  When she came in today, her Hgb was low.  They called Dr. Signe Colt and recommended blood transfusion.  She has ben feeling bad - no energy, SOB, fatigue, restlessness, her legs don't work.  Last transfusion was in December.  She has periodic dark stools, they have been black the last 3-4 days.  No abdominal pain.  Some dysphagia - food primarily, this is also getting worse.  She takes tylenol for pain, no NSAIDs.  Her hip and knee have been hurting for the last week, and she can't get comfortable at night.  She takes Protonix BID.  She also takes Carafate 4 times a day.      ER Course:  Stage 4 CKD, chronic Feraheme injections monthly.  Hgb 7.4 today, decreased from 10 in the last month. For 3 days, fatigue, SOB, more persistent dark stools.  Has melena on exam..  Has CHF, followed by Dr. Gala Romney.  [facility] will consult, suggests clear liquids and holding Eliquis.     Review of Systems: As mentioned in the history of present illness. All other systems reviewed and are negative. Past Medical History:  Diagnosis Date   Anemia    Atrial fibrillation (HCC)    Chronic combined systolic and diastolic CHF (congestive heart failure) (HCC)     a. LV dysfcuntion felt to be related to PVCs   Chronic kidney disease, stage 4 (severe) (HCC)    Diabetes mellitus without complication (HCC)    GERD (gastroesophageal reflux disease)    Hx of echocardiogram    Echo (1/16):  EF 60-65%, no RWMA, Gr 1 DD, mod AI, mod MR, mild LAE   Hyperkalemia    Hyperlipidemia    Hypertension    NICM (nonischemic cardiomyopathy) (HCC)    a. 12/2018 Echo: EF 35%, diff HK w/ septal-lateral dyssynchrony. Nl RV fxn; b. 07/2019 s/p MDT [id number] Paulene Floor CRT-P MRI SureScan device (ser #: [id number])   Orthostatic hypotension    PVC (premature ventricular contraction)    a. s/p PVC ablation in 11/2016   Rheumatic fever    Wears dentures    Past Surgical History:  Procedure Laterality Date   BALLOON DILATION N/A 07/12/2020   Procedure: BALLOON DILATION;  Surgeon: Bernette Redbird, MD;  Location: [facility] ENDOSCOPY;  Service: Endoscopy;  Laterality: N/A;   BIOPSY  08/20/2019   Procedure: BIOPSY;  Surgeon: Kathi Der, MD;  Location: [facility] ENDOSCOPY;  Service: Gastroenterology;;   BIV PACEMAKER INSERTION CRT-P N/A 07/24/2019   Procedure: BIV PACEMAKER INSERTION CRT-P;  Surgeon: Regan Lemming, MD;  Location: [facility] INVASIVE CV LAB;  Service: Cardiovascular;  Laterality: N/A;   COLONOSCOPY WITH PROPOFOL N/A 08/22/2019   Procedure: COLONOSCOPY WITH PROPOFOL;  Surgeon: Carman Ching, MD;  Location: [facility] ENDOSCOPY;  Service: Endoscopy;  Laterality: N/A;   ESOPHAGOGASTRODUODENOSCOPY N/A 02/28/2021   Procedure: ESOPHAGOGASTRODUODENOSCOPY (EGD);  Surgeon: Charlott Rakes, MD;  Location: [facility] ENDOSCOPY;  Service: Endoscopy;  Laterality: N/A;   ESOPHAGOGASTRODUODENOSCOPY (EGD) WITH PROPOFOL N/A 09/25/2018   Procedure: ESOPHAGOGASTRODUODENOSCOPY (EGD) WITH PROPOFOL;  Surgeon: Bernette Redbird, MD;  Location: [facility];  Service: Endoscopy;  Laterality: N/A;   ESOPHAGOGASTRODUODENOSCOPY (EGD) WITH PROPOFOL N/A 10/29/2018   Procedure: ESOPHAGOGASTRODUODENOSCOPY (EGD) WITH  PROPOFOL;  Surgeon: Bernette Redbird, MD;  Location: [facility];  Service: Endoscopy;  Laterality: N/A;   ESOPHAGOGASTRODUODENOSCOPY (EGD) WITH PROPOFOL N/A 08/20/2019   Procedure: ESOPHAGOGASTRODUODENOSCOPY (EGD) WITH PROPOFOL;  Surgeon: Kathi Der, MD;  [facility]: [facility];  Service: Gastroenterology;  Laterality: N/A;   ESOPHAGOGASTRODUODENOSCOPY (EGD) WITH PROPOFOL N/A 05/03/2020   Procedure: ESOPHAGOGASTRODUODENOSCOPY (EGD) WITH PROPOFOL;  Surgeon: Bernette Redbird, MD;  Location: [facility];  Service: Endoscopy;  Laterality: N/A;   ESOPHAGOGASTRODUODENOSCOPY (EGD) WITH PROPOFOL N/A 06/02/2020   Procedure: ESOPHAGOGASTRODUODENOSCOPY (EGD) WITH PROPOFOL and Savory Dilatation;  Surgeon: Vida Rigger, MD;  Location: [facility];  Service: Gastroenterology;  Laterality: N/A;   ESOPHAGOGASTRODUODENOSCOPY (EGD) WITH PROPOFOL N/A 07/12/2020   Procedure: ESOPHAGOGASTRODUODENOSCOPY (EGD) WITH PROPOFOL with dilatation;  Surgeon: Bernette Redbird, MD;  Location: [facility];  Service: Endoscopy;  Laterality: N/A;   ESOPHAGOGASTRODUODENOSCOPY (EGD) WITH PROPOFOL N/A 05/31/2021   Procedure: ESOPHAGOGASTRODUODENOSCOPY (EGD) WITH PROPOFOL;  Surgeon: Kathi Der, MD;  Location: [facility];  Service: Gastroenterology;  Laterality: N/A;   ESOPHAGOGASTRODUODENOSCOPY (EGD) WITH PROPOFOL N/A 10/10/2021   Procedure: ESOPHAGOGASTRODUODENOSCOPY (EGD) WITH PROPOFOL;  Surgeon: Kathi Der, MD;  Location: [facility];  Service: Gastroenterology;  Laterality: N/A;   HEMOSTASIS CLIP PLACEMENT  08/22/2019   Procedure: HEMOSTASIS CLIP PLACEMENT;  Surgeon: Carman Ching, MD;  Location: [facility] ENDOSCOPY;  Service: Endoscopy;;   KNEE SURGERY Left 2009   OVARIAN CYST REMOVAL     POLYPECTOMY  08/22/2019   Procedure: POLYPECTOMY;  Surgeon: Carman Ching, MD;  Location: [facility] ENDOSCOPY;  Service: Endoscopy;;   POLYPECTOMY  05/31/2021   Procedure: POLYPECTOMY;  Surgeon: Kathi Der, MD;  Location: [facility]  ENDOSCOPY;  Service: Gastroenterology;;   PVC ABLATION N/A 11/30/2016   Procedure: PVC Ablation;  Surgeon: Will Jorja Loa, MD;  [facility];  Service: Cardiovascular;  Laterality: N/A;   RIGHT HEART CATH N/A 01/31/2018   Procedure: RIGHT HEART CATH;  Surgeon: Dolores Patty, MD;  [facility];  Service: Cardiovascular;  Laterality: N/A;   RIGHT/LEFT HEART CATH AND CORONARY ANGIOGRAPHY N/A 01/25/2017   Procedure: Right/Left Heart Cath and Coronary Angiography;  Surgeon: Dolores Patty, MD;  [facility] INVASIVE CV LAB;  Service: Cardiovascular;  Laterality: N/A;   SAVORY DILATION N/A 09/25/2018   Procedure: SAVORY DILATION;  Surgeon: Bernette Redbird, MD;  Location: [facility];  Service: Endoscopy;  Laterality: N/A;   SAVORY DILATION N/A 10/29/2018   Procedure: SAVORY DILATION;  Surgeon: Bernette Redbird, MD;  Location: [facility];  Service: Endoscopy;  Laterality: N/A;   SAVORY DILATION N/A 08/20/2019   Procedure: SAVORY DILATION;  Surgeon: Kathi Der, MD;  Location: [facility];  Service: Gastroenterology;  Laterality: N/A;   SAVORY DILATION N/A 05/03/2020   Procedure: SAVORY DILATION;  Surgeon: Bernette Redbird, MD;  Location: [facility] ENDOS[facility];  Service: Endoscopy;  Laterality: N/A;  Patient needs ultraslim pediatric upper endoscope/     no fluoro per dr. Dennie Maizes DILATION N/A 06/02/2020   Procedure: SAVORY DILATION w/ Anne Shutter;  Surgeon: Vida Rigger, MD;  Location: [facility] ENDOS[facility];  Service: Gastroenterology;  Laterality: N/A;  Social History:  reports that she quit smoking about 5 years ago. Her smoking use included cigarettes. She has a 45.00 pack-year smoking history. She has never used smokeless tobacco. She reports that she does not drink alcohol and does not use drugs.  Allergies  Allergen Reactions   Epoetin Alfa Nausea Only and Other (See Comments)    Procrit and Epogen: "Chills, low grade fever, body aches, fatigue, nausea for 3  days"    Penicillins Itching and Rash    Amoxicillin ok- IM pen is what gives reaction  Did it involve swelling of the face/tongue/throat, SOB, or low BP? No Did it involve sudden or severe rash/hives, skin peeling, or any reaction on the inside of your mouth or nose? No Did you need to seek medical attention at a hospital or doctor's office? No When did it last happen?      10 + year If all above answers are NO, may proceed with cephalosporin use.  Other reaction(s): rash, itching   Retacrit [Epoetin (Alfa)] Other (See Comments)    Chills, low grade fever, body aches, fatigue, nausea for 3 days.   Crestor [Rosuvastatin] Nausea Only   Hydrocodone Itching and Other (See Comments)    And the patient "got mean"   Influenza Vac Split Quad Swelling and Other (See Comments)    Local swelling and fever   Tape Other (See Comments)    The skin tears easily   Cefaclor Itching   Crestor [Rosuvastatin Calcium] Nausea Only   Influenza Vaccines Swelling and Other (See Comments)    Swelling and redness at injection site, fever   Lidocaine Hcl Palpitations and Other (See Comments)    Heart racing if Lidocaine is with EPI   Nsaids Other (See Comments)    Pt states she is not taking because of kidney function    Percocet [Oxycodone-Acetaminophen] Nausea And Vomiting   Prednisone Other (See Comments)    Dizziness, spiked blood sugar     Family History  Problem Relation Age of Onset   Heart attack Father    Heart disease Father    Cancer Father    Hypertension Father    Hypertension Brother    Stroke Brother    Hypertension Brother    Diabetes Son    Hyperlipidemia Son    Hypertension Son     Prior to Admission medications   Medication Sig Start Date End Date Taking? Authorizing Provider  acetaminophen (TYLENOL) 500 MG tablet Take 1,000 mg by mouth every 6 (six) hours as needed for moderate pain, headache or mild pain.    [provider]  amiodarone (PACERONE) 100 MG  tablet Take 100 mg by mouth every other day.    [provider]  atorvastatin (LIPITOR) 20 MG tablet Take 20 mg by mouth every other day.    [provider]  carvedilol (COREG) 12.5 MG tablet TAKE ONE TABLET BY MOUTH TWICE DAILY WITH A MEAL 07/21/21   Bensimhon, Bevelyn Buckles, MD  cholecalciferol (VITAMIN D3) 25 MCG (1000 UNIT) tablet Take 2,000 Units by mouth every morning.    [provider]  diphenhydrAMINE (BENADRYL) 25 mg capsule Take 25 mg by mouth every 6 (six) hours as needed (allergies/sleep).    [provider]  ELIQUIS 2.5 MG TABS tablet TAKE ONE TABLET BY MOUTH TWICE DAILY 07/21/21   Camnitz, Andree Coss, MD  Ensure (ENSURE) Take 237 mLs by mouth daily.    [provider]  hydrALAZINE (APRESOLINE) 50 MG tablet TAKE ONE  TABLET BY MOUTH TWICE DAILY 10/20/21   Nahser, Deloris Ping, MD  NOVAFERRUM 125 MG/5ML LIQD Take by mouth daily. 11/10/21   [provider]  pantoprazole (PROTONIX) 40 MG tablet Take 40 mg by mouth in the morning and at bedtime.    [provider]  Polyethyl Glycol-Propyl Glycol 0.4-0.3 % SOLN Place 1 drop into both eyes every 8 (eight) hours as needed (dryness).    [provider]  polyethylene glycol (MIRALAX / GLYCOLAX) 17 g packet Take 17 g by mouth daily as needed (constipation.).    [provider]  sucralfate (CARAFATE) 1 GM/10ML suspension Take 10 mLs (1 g total) by mouth 4 (four) times daily. 10/10/21 01/08/22  Kathi Der, MD  torsemide (DEMADEX) 20 MG tablet Take 2 tablets (40 mg total) by mouth every morning for 3 days, THEN 1 tablet (20 mg total) every evening for 3 days, THEN 2 tablets (40 mg total) daily. 12/13/21 01/18/22  Jacklynn Ganong, FNP    Physical Exam: Vitals:   12/26/21 1451 12/26/21 1600 12/26/21 1625 12/26/21 1700  BP: (!) 136/57 (!) 145/59 (!) 144/82 (!) 157/63  Pulse: 90 100 92 100  Resp: 16 20 20 17   Temp:   98 F (36.7 C) 98.2 F (36.8 C)  TempSrc:      SpO2:  100% 98% 100% 100%   General:  Appears calm and comfortable and is in NAD Eyes:  PERRL, EOMI, normal lids, iris ENT:  grossly normal hearing, lips & tongue, mmm; appropriate dentition Neck:  no LAD, masses or thyromegaly Cardiovascular:  RRR, no m/r/g. Tr LE edema.  Respiratory:   CTA bilaterally with no wheezes/rales/rhonchi.  Normal respiratory effort. Abdomen:  soft, NT, ND Skin:  no rash or induration seen on limited exam Musculoskeletal:  grossly normal tone BUE/BLE, good ROM, no bony abnormality Lower extremity:  Tr LE edema.  Limited foot exam with no ulcerations.  2+ distal pulses. Psychiatric:  blunted mood and affect, speech fluent and appropriate, AOx3 Neurologic:  CN 2-12 grossly intact, moves all extremities in coordinated fashion   Radiological Exams on Admission: Independently reviewed - see discussion in A/P where applicable  No results found.  EKG:  Afib with ventricular paced rate 83, RBBB   Labs on Admission: I have personally reviewed the available labs and imaging studies at the time of the admission.  Pertinent labs:    Glucose 272 BUN 47/Creatinine 2.20/GFR 22 - stable BNP 279 Iron 18, Ferritin 10 Hgb 7.6; 10.7 on 1/31 Heme positive    Assessment and Plan: * Symptomatic anemia -Patient with recurrent esophagitis/stricture/ulcer and recurrent sx anemia presenting with the same -She was sent from the infusion clinic, sees them monthly for Aranesp or Feraheme infusion -She has had symptoms of SOB, fatigue the last few days and has noticed melena -Hgb 7.6, down from 10.7 on 1/31 -Currently normocytic -Recent iron testing with low iron and ferritin, c/w iron deficiency anemia (likely in conjunction with ABLA) -Heme testing is positive -Will observe on telemetry -Transfuse 1 unit PRBC to start and recheck Hgb afterwards. Goal Hgb is >8 given her heart history.  Acute upper GI bleed -Patient is presenting with melena and sx anemia, suggestive of upper  GI bleeding. -Patient has history of esophagitis, ulcer, stricture -The patient is not tachycardic with normal blood pressure, suggesting subacute volume loss.  -GI consulted by ED, will follow up recommendations -NPO after MN for possible EGD -LR at 75 mL/hr (held while giving blood and when not  NPO) -Start IV pantoprazole bolus and infusion -Zofran IV for nausea -Avoid NSAIDs and SQ heparin -Maintain IV access (2 large bore IVs if possible). -Hold Eliquis for now  Paroxysmal atrial fibrillation (HCC) -Rate controlled with Coreg, Amiodarone -Hold Eliquis - last dose was this AM so maybe EGD tomorrow afternoon vs. 3/9  Chronic combined systolic (congestive) and diastolic (congestive) heart failure (HCC) -09/2021 echo with ER 50-50% and grade 1 DD -Will limit IVF where possible and continue home diuretic for now -Appears to be compensated at this time  CKD (chronic kidney disease) stage 4, GFR 15-29 ml/min (HCC) -Advanced but stable CKD -Needs to continue to avoid nephrotoxic medications -Closely followed by Dr. Signe Colt  DNR (do not resuscitate) -I have discussed code status with the patient and her daughter and  they are in agreement that the patient would not desire resuscitation and would prefer to die a natural death should that situation arise. -She will need a gold out of facility DNR form at the time of discharge  Diabetes mellitus with complication (HCC) -Will check A1c -She does not appear to be taking medications for this issue currently -Cover with sensitive-scale SSI  Mixed hyperlipidemia -Continue Lipitor  Uncontrolled hypertension -Continue home Coreg, hydralazine     Advance Care Planning:   Code Status: DNR   Consults: GI  DVT Prophylaxis: SCDs  Family Communication: Daughter was present throughout evaluation  Severity of Illness: The appropriate patient status for this patient is OBSERVATION. Observation status is judged to be reasonable and  necessary in order to provide the required intensity of service to ensure the patient's safety. The patient's presenting symptoms, physical exam findings, and initial radiographic and laboratory data in the context of their medical condition is felt to place them at decreased risk for further clinical deterioration. Furthermore, it is anticipated that the patient will be medically stable for discharge from the hospital within 2 midnights of admission.   Author: Jonah Blue, MD 12/26/2021 6:00 PM  For on call review [email or web address].

## 2021-12-26 NOTE — Assessment & Plan Note (Signed)
-  Advanced but stable CKD ?-Needs to continue to avoid nephrotoxic medications ?-Closely followed by Dr. Hollie Salk ?

## 2021-12-26 NOTE — Assessment & Plan Note (Addendum)
S/p EGD: Esophageal ulcer-continue PPI twice a day indefinitely, continue sucralfate 1 g 4 times a day for 2 months. ?Esophageal stricture-dilated with 15 mm balloon, repeat EGD scheduled as an outpatient with Dr. Alessandra Bevels on 03/20/2022. ?Okay to resume Eliquis from Sunday, 12/31/2021. ?

## 2021-12-27 DIAGNOSIS — K2210 Ulcer of esophagus without bleeding: Secondary | ICD-10-CM | POA: Diagnosis not present

## 2021-12-27 DIAGNOSIS — Z66 Do not resuscitate: Secondary | ICD-10-CM | POA: Diagnosis not present

## 2021-12-27 DIAGNOSIS — M25559 Pain in unspecified hip: Secondary | ICD-10-CM

## 2021-12-27 DIAGNOSIS — K222 Esophageal obstruction: Secondary | ICD-10-CM | POA: Diagnosis not present

## 2021-12-27 DIAGNOSIS — K922 Gastrointestinal hemorrhage, unspecified: Secondary | ICD-10-CM | POA: Insufficient documentation

## 2021-12-27 DIAGNOSIS — K921 Melena: Secondary | ICD-10-CM | POA: Diagnosis not present

## 2021-12-27 DIAGNOSIS — E1122 Type 2 diabetes mellitus with diabetic chronic kidney disease: Secondary | ICD-10-CM | POA: Diagnosis not present

## 2021-12-27 DIAGNOSIS — K2289 Other specified disease of esophagus: Secondary | ICD-10-CM | POA: Diagnosis not present

## 2021-12-27 DIAGNOSIS — D62 Acute posthemorrhagic anemia: Secondary | ICD-10-CM | POA: Diagnosis not present

## 2021-12-27 DIAGNOSIS — Z7901 Long term (current) use of anticoagulants: Secondary | ICD-10-CM | POA: Diagnosis not present

## 2021-12-27 DIAGNOSIS — K2211 Ulcer of esophagus with bleeding: Secondary | ICD-10-CM | POA: Diagnosis not present

## 2021-12-27 DIAGNOSIS — E871 Hypo-osmolality and hyponatremia: Secondary | ICD-10-CM | POA: Diagnosis not present

## 2021-12-27 DIAGNOSIS — E1151 Type 2 diabetes mellitus with diabetic peripheral angiopathy without gangrene: Secondary | ICD-10-CM | POA: Diagnosis not present

## 2021-12-27 DIAGNOSIS — Z20822 Contact with and (suspected) exposure to covid-19: Secondary | ICD-10-CM | POA: Diagnosis not present

## 2021-12-27 DIAGNOSIS — J449 Chronic obstructive pulmonary disease, unspecified: Secondary | ICD-10-CM | POA: Diagnosis not present

## 2021-12-27 DIAGNOSIS — K3189 Other diseases of stomach and duodenum: Secondary | ICD-10-CM | POA: Diagnosis not present

## 2021-12-27 DIAGNOSIS — I428 Other cardiomyopathies: Secondary | ICD-10-CM | POA: Diagnosis not present

## 2021-12-27 DIAGNOSIS — R1310 Dysphagia, unspecified: Secondary | ICD-10-CM | POA: Diagnosis not present

## 2021-12-27 DIAGNOSIS — I130 Hypertensive heart and chronic kidney disease with heart failure and stage 1 through stage 4 chronic kidney disease, or unspecified chronic kidney disease: Secondary | ICD-10-CM | POA: Diagnosis not present

## 2021-12-27 DIAGNOSIS — I5042 Chronic combined systolic (congestive) and diastolic (congestive) heart failure: Secondary | ICD-10-CM | POA: Diagnosis not present

## 2021-12-27 DIAGNOSIS — D649 Anemia, unspecified: Secondary | ICD-10-CM | POA: Diagnosis not present

## 2021-12-27 LAB — TYPE AND SCREEN
ABO/RH(D): A POS
Antibody Screen: NEGATIVE
Unit division: 0

## 2021-12-27 LAB — GLUCOSE, CAPILLARY
Glucose-Capillary: 146 mg/dL — ABNORMAL HIGH (ref 70–99)
Glucose-Capillary: 149 mg/dL — ABNORMAL HIGH (ref 70–99)
Glucose-Capillary: 155 mg/dL — ABNORMAL HIGH (ref 70–99)

## 2021-12-27 LAB — CBC
HCT: 25.1 % — ABNORMAL LOW (ref 36.0–46.0)
HCT: 30.5 % — ABNORMAL LOW (ref 36.0–46.0)
Hemoglobin: 8 g/dL — ABNORMAL LOW (ref 12.0–15.0)
Hemoglobin: 9.8 g/dL — ABNORMAL LOW (ref 12.0–15.0)
MCH: 26.5 pg (ref 26.0–34.0)
MCH: 26.7 pg (ref 26.0–34.0)
MCHC: 31.9 g/dL (ref 30.0–36.0)
MCHC: 32.1 g/dL (ref 30.0–36.0)
MCV: 83.1 fL (ref 80.0–100.0)
MCV: 83.1 fL (ref 80.0–100.0)
Platelets: 217 10*3/uL (ref 150–400)
Platelets: 252 10*3/uL (ref 150–400)
RBC: 3.02 MIL/uL — ABNORMAL LOW (ref 3.87–5.11)
RBC: 3.67 MIL/uL — ABNORMAL LOW (ref 3.87–5.11)
RDW: 17.7 % — ABNORMAL HIGH (ref 11.5–15.5)
RDW: 17.7 % — ABNORMAL HIGH (ref 11.5–15.5)
WBC: 4.9 10*3/uL (ref 4.0–10.5)
WBC: 6.6 10*3/uL (ref 4.0–10.5)
nRBC: 0 % (ref 0.0–0.2)
nRBC: 0 % (ref 0.0–0.2)

## 2021-12-27 LAB — BASIC METABOLIC PANEL
Anion gap: 9 (ref 5–15)
BUN: 40 mg/dL — ABNORMAL HIGH (ref 8–23)
CO2: 23 mmol/L (ref 22–32)
Calcium: 8.8 mg/dL — ABNORMAL LOW (ref 8.9–10.3)
Chloride: 100 mmol/L (ref 98–111)
Creatinine, Ser: 1.81 mg/dL — ABNORMAL HIGH (ref 0.44–1.00)
GFR, Estimated: 27 mL/min — ABNORMAL LOW (ref >60)
Glucose, Bld: 165 mg/dL — ABNORMAL HIGH (ref 70–99)
Potassium: 3.4 mmol/L — ABNORMAL LOW (ref 3.5–5.1)
Sodium: 132 mmol/L — ABNORMAL LOW (ref 135–145)

## 2021-12-27 LAB — BPAM RBC
Blood Product Expiration Date: 202303182359
ISSUE DATE / TIME: 202303071659
Unit Type and Rh: 6200

## 2021-12-27 MED ORDER — LIDOCAINE 5 % EX PTCH
2.0000 | MEDICATED_PATCH | CUTANEOUS | Status: DC
  Administered 2021-12-27 – 2021-12-29 (×3): 2 via TRANSDERMAL
  Filled 2021-12-27 (×3): qty 2

## 2021-12-27 NOTE — Assessment & Plan Note (Addendum)
-  tylenol not helping. ?-avoidin nsaids ?-can not have norco ?-lidocaine patch improving ?-referral placed to ortho ?

## 2021-12-27 NOTE — Progress Notes (Signed)
NEW ADMISSION NOTE ?New Admission Note:  ?  ?Arrival Method: Bed ?Mental Orientation: A&O x 4 ?Telemetry:#  14 ?Assessment: Initiated ?Skin: intact ?IV: rt/lf arm ?Pain: none ?Tubes: none ?Safety Measures: Call light in place, Low bed position ?Admission: Initiated ?[facility] Orientation: Patient has been oriented to the room, unit and staff.  ?Family: None at bedside ?  ?Orders have been reviewed and implemented. Will continue to monitor the patient. Call light has been placed within reach and bed alarm has been activated.  ?

## 2021-12-27 NOTE — Progress Notes (Signed)
?  Transition of Care (TOC) Screening Note ? ? ?Patient Details  ?Name: Krista Mason ?Date of Birth: [birth year] ? ? ?Transition of Care (TOC) CM/SW Contact:    ?Tom-Johnson, Renea Ee, RN ?Phone Number: ?12/27/2021, 1:15 PM ? ?Patient is admitted for Symptomatic Anemia. Hgb is 7.6 from 10.7 last lab. 1 unit PRBC given. Eliquis on hold for EGD tomorrow. From home alone.Lives at [facility]. Has three children. Has a walker, rollator and wheelchair at home. Patient does not drive, family transports to and from appointments and does errands. PCP is at [place] at [facility]. Uses [facility] for medications and also [facility] on [address].  ?Transition of Care Department [facility]) has reviewed patient and no TOC needs or recommendations have been identified at this time. TOC will continue to monitor patient advancement through interdisciplinary progression rounds. If new patient transition needs arise, please place a TOC consult. ?  ?

## 2021-12-27 NOTE — Progress Notes (Signed)
?Progress Note ? ? ?PatientMarland Mason DIAMONDS LIPPARD BOF:[id number] DOB: 11/27/1935 DOA: 12/26/2021     0 ?DOS: the patient was seen and examined on 12/27/2021 ?  ?Brief hospital course: ?Krista Mason is a 86 y.o. female with medical history significant of anemia; afib; chronic combined CHF; DM; HTN; and HLD presenting with symptomatic anemia (sent by infusion clinic).  She has a h/o esophagitis/ulcer and esophageal stricture with recurrent anemia.  She receives infusions from the infusion clinic and she received one today and then was sent by Dr. Hollie Salk for blood transfusion.  She gets iron infusions or shots monthly for low Hgb.  When she came in today, her Hgb was low.  They called Dr. Hollie Salk and recommended blood transfusion.  She has ben feeling bad - no energy, SOB, fatigue, restlessness, her legs don't work.  Last transfusion was in December.  She has periodic dark stools, they have been black the last 3-4 days. ? ?Assessment and Plan: ?* Symptomatic anemia ?-Patient with recurrent esophagitis/stricture/ulcer and recurrent sx anemia presenting with the same ?-She was sent from the infusion clinic, sees them monthly for Aranesp or Feraheme infusion ?-She has had symptoms of SOB, fatigue the last few days and has noticed melena ?-Hgb 7.6, down from 10.7 on 1/31 ?-Recent iron testing with low iron and ferritin, c/w iron deficiency anemia (likely in conjunction with ABLA) ?-Heme testing is positive ?-Transfused 1 unit PRBC ?- Goal Hgb is >8 given her heart history. ? ?Acute upper GI bleed ?-Patient is presenting with melena and sx anemia, suggestive of upper GI bleeding. ?-Patient has history of esophagitis, ulcer, stricture ?-The patient is not tachycardic with normal blood pressure, suggesting subacute volume loss.  ?-GI consulted by ED-plan for EGD 3/9 ?-LR at 75 mL/hr (held while giving blood and when not NPO) ?-Start IV pantoprazole bolus and infusion ?-Zofran IV for nausea ?-Avoid NSAIDs and SQ heparin ?-Maintain IV access  (2 large bore IVs if possible). ?-Hold Eliquis for now ? ?Paroxysmal atrial fibrillation (HCC) ?-Rate controlled with Coreg, Amiodarone ?-Hold Eliquis - last dose was this AM so maybe EGD tomorrow afternoon vs. 3/9 ? ?Chronic combined systolic (congestive) and diastolic (congestive) heart failure ([place]) ?-09/2021 echo with ER 50-50% and grade 1 DD ?-Will limit IVF where possible and continue home diuretic for now ?-Appears to be compensated at this time ? ?CKD (chronic kidney disease) stage 4, GFR 15-29 ml/min (HCC) ?-Advanced but stable CKD ?-Needs to continue to avoid nephrotoxic medications ?-Closely followed by Dr. Hollie Salk ? ?Hip pain ?-tylenol not helping. ?-avoidin nsaids ?-can not have norco ?-lidocaine patch ? ?Diabetes mellitus with complication (HCC) ?-D7O: 7.4 ?-She does not appear to be taking medications for this issue currently ?-Cover with sensitive-scale SSI ? ?Mixed hyperlipidemia ?-Continue Lipitor ? ?Uncontrolled hypertension ?-Continue home Coreg, hydralazine ? ? ? ? ? ?Subjective: c/o left hip and knee pain- since recent rain ? ?Physical Exam: ?Vitals:  ? 12/26/21 2221 12/27/21 0300 12/27/21 0551 12/27/21 0925  ?BP: 140/69 (!) 145/55 (!) 149/54 (!) 157/67  ?Pulse: 94 70 79 73  ?Resp: '20 18 16 17  '$ ?Temp: 97.8 ?F (36.6 ?C) 97.7 ?F (36.5 ?C) 98 ?F (36.7 ?C) 98 ?F (36.7 ?C)  ?TempSrc: Oral Oral Oral Oral  ?SpO2: 98% 97% 98% 100%  ?Weight:      ?Height:      ? ? ?General: Appearance:    Well developed, well nourished female in no acute distress  ?   ?Lungs:     respirations unlabored  ?  Heart:    Normal heart rate.   ?MS:   All extremities are intact.  ?  ?Neurologic:   Awake, alert, oriented x 3. No apparent focal neurological           defect.   ?  ?Data Reviewed: ?Hgb 8 ?Cr stable at 1.8 ?K low at 3.4 ? ? ? ?Disposition: ?Status is: Observation ?The patient will require care spanning > 2 midnights and should be moved to inpatient because: needs EGD in AM ? Planned Discharge Destination:  tbd ? ?Time  spent: 45 minutes ? ?Author: ?Geradine Girt, DO ?12/27/2021 11:31 AM ? ?For on call review [email or web address].  ?

## 2021-12-27 NOTE — H&P (View-Only) (Signed)
Referring Provider: [facility] Primary Care Physician:  [facility] [place] Primary Gastroenterologist:  Dr. Alessandra Bevels  Reason for Consultation:  Symptomatic anemia  HPI: Krista Mason is a 86 y.o. female medical history significant of anemia; afib; chronic combined CHF; DM; HTN; and HLD resenting with acute on chronic anemia (sent by infusion clinic).  Gets monthly infusions for anemia. Was sent to ED by infusion clinic for low hgb (7.4 on arrival decreased from 10 last month).   Patient states over the last 3-5 days she has been experiencing shortness of breath on exertion and heaviness in her legs. States she has trouble walking from the living room to the kitchen. States she typically has intermittent dark stools due to being on oral iron, iron infusions, and iron shots. Last stool was Monday (3/6) and was solid, soft/formed, and dark. Patient is on Eliquis and last dose was 3/7 AM. She states she has had chronic dysphagia that hasn't gotten better or worsened with multiple previous EGDs. EGDs show benign stenosis not amenable to dilation. She states since her last EGD 09/2021 her nausea and vomiting has improved. Though recently she has had 3 episodes of vomiting when trying to swallow medicine. Denies NSAID use. Denies unintentional weight loss. Denies heartburn. Takes PPI '40mg'$  BID and carafate QID.  Multiple EGDs showing esophageal ulcer and esophageal stenosis: 09/2018, 10/2018, 04/2020, 06/2020, 02/2021, 05/2021, 09/2021.  02/2021: Benign-appearing esophageal stenosis, congested erythematous ulcerated mucosa in the esophagus, few mucosal papules found in the stomach, acute gastritis  8/22: Esophageal ulcer with no bleeding, benign-appearing esophageal stenosis, lesion not amenable to dilation so was not attempted.  3 hyperplastic gastric polyps, repeat EGD in 3 months  09/2021 for melena/dysphagia: esophageal ulcer, esophageal stenosis, medium sized hiatal hernia, no specimens  collected. repeat in 2 months.  Colonoscopy 07/2019: Fair prep, moderate diverticulosis in sigmoid and descending colon.  A 7 mm tubular adenoma in cecum, clips placed.  Past Medical History:  Diagnosis Date   Anemia    Atrial fibrillation (HCC)    Chronic combined systolic and diastolic CHF (congestive heart failure) ([place])    a. LV dysfcuntion felt to be related to PVCs   Chronic kidney disease, stage 4 (severe) (HCC)    Diabetes mellitus without complication (HCC)    GERD (gastroesophageal reflux disease)    Hx of echocardiogram    Echo (1/16):  EF 60-65%, no RWMA, Gr 1 DD, mod AI, mod MR, mild LAE   Hyperkalemia    Hyperlipidemia    Hypertension    NICM (nonischemic cardiomyopathy) ([place])    a. 12/2018 Echo: EF 35%, diff HK w/ septal-lateral dyssynchrony. Nl RV fxn; b. 07/2019 s/p MDT [id number] [name] CRT-P MRI SureScan device (ser #: [id number])   Orthostatic hypotension    PVC (premature ventricular contraction)    a. s/p PVC ablation in 11/2016   Rheumatic fever    Wears dentures     Past Surgical History:  Procedure Laterality Date   BALLOON DILATION N/A 07/12/2020   Procedure: BALLOON DILATION;  Surgeon: Ronald Lobo, MD;  Location: [facility];  Service: Endoscopy;  Laterality: N/A;   BIOPSY  08/20/2019   Procedure: BIOPSY;  Surgeon: Otis Brace, MD;  Location: [facility];  Service: Gastroenterology;;   BIV PACEMAKER INSERTION CRT-P N/A 07/24/2019   Procedure: BIV PACEMAKER INSERTION CRT-P;  Surgeon: Constance Haw, MD;  Location: [place] [facility];  Service: Cardiovascular;  Laterality: N/A;   COLONOSCOPY WITH PROPOFOL N/A 08/22/2019   Procedure:  COLONOSCOPY WITH PROPOFOL;  Surgeon: Laurence Spates, MD;  Location: [place];  Service: Endoscopy;  Laterality: N/A;   ESOPHAGOGASTRODUODENOSCOPY N/A 02/28/2021   Procedure: ESOPHAGOGASTRODUODENOSCOPY (EGD);  Surgeon: Wilford Corner, MD;  Location: [place];  Service: Endoscopy;  Laterality: N/A;    ESOPHAGOGASTRODUODENOSCOPY (EGD) WITH PROPOFOL N/A 09/25/2018   Procedure: ESOPHAGOGASTRODUODENOSCOPY (EGD) WITH PROPOFOL;  Surgeon: Ronald Lobo, MD;  [facility];  Service: Endoscopy;  Laterality: N/A;   ESOPHAGOGASTRODUODENOSCOPY (EGD) WITH PROPOFOL N/A 10/29/2018   Procedure: ESOPHAGOGASTRODUODENOSCOPY (EGD) WITH PROPOFOL;  Surgeon: Ronald Lobo, MD;  [facility];  Service: Endoscopy;  Laterality: N/A;   ESOPHAGOGASTRODUODENOSCOPY (EGD) WITH PROPOFOL N/A 08/20/2019   Procedure: ESOPHAGOGASTRODUODENOSCOPY (EGD) WITH PROPOFOL;  Surgeon: Otis Brace, MD;  Location: [place];  Service: Gastroenterology;  Laterality: N/A;   ESOPHAGOGASTRODUODENOSCOPY (EGD) WITH PROPOFOL N/A 05/03/2020   Procedure: ESOPHAGOGASTRODUODENOSCOPY (EGD) WITH PROPOFOL;  Surgeon: Ronald Lobo, MD;  [facility];  Service: Endoscopy;  Laterality: N/A;   ESOPHAGOGASTRODUODENOSCOPY (EGD) WITH PROPOFOL N/A 06/02/2020   Procedure: ESOPHAGOGASTRODUODENOSCOPY (EGD) WITH PROPOFOL and Savory Dilatation;  Surgeon: Clarene Essex, MD;  [facility];  Service: Gastroenterology;  Laterality: N/A;   ESOPHAGOGASTRODUODENOSCOPY (EGD) WITH PROPOFOL N/A 07/12/2020   Procedure: ESOPHAGOGASTRODUODENOSCOPY (EGD) WITH PROPOFOL with dilatation;  Surgeon: Ronald Lobo, MD;  [facility];  Service: Endoscopy;  Laterality: N/A;   ESOPHAGOGASTRODUODENOSCOPY (EGD) WITH PROPOFOL N/A 05/31/2021   Procedure: ESOPHAGOGASTRODUODENOSCOPY (EGD) WITH PROPOFOL;  Surgeon: Otis Brace, MD;  [facility];  Service: Gastroenterology;  Laterality: N/A;   ESOPHAGOGASTRODUODENOSCOPY (EGD) WITH PROPOFOL N/A 10/10/2021   Procedure: ESOPHAGOGASTRODUODENOSCOPY (EGD) WITH PROPOFOL;  Surgeon: Otis Brace, MD;  [facility];  Service: Gastroenterology;  Laterality: N/A;   HEMOSTASIS CLIP PLACEMENT  08/22/2019   Procedure: HEMOSTASIS CLIP PLACEMENT;  Surgeon: Laurence Spates, MD;   Location: [facility];  Service: Endoscopy;;   KNEE SURGERY Left 2009   OVARIAN CYST REMOVAL     POLYPECTOMY  08/22/2019   Procedure: POLYPECTOMY;  Surgeon: Laurence Spates, MD;  Location: [place];  Service: Endoscopy;;   POLYPECTOMY  05/31/2021   Procedure: POLYPECTOMY;  Surgeon: Otis Brace, MD;  [facility]: [facility];  Service: Gastroenterology;;   PVC ABLATION N/A 11/30/2016   Procedure: PVC Ablation;  Surgeon: Will [name], MD;  Location: [place] [facility];  Service: Cardiovascular;  Laterality: N/A;   RIGHT HEART CATH N/A 01/31/2018   Procedure: RIGHT HEART CATH;  Surgeon: Jolaine Artist, MD;  Location: [place] [facility];  Service: Cardiovascular;  Laterality: N/A;   RIGHT/LEFT HEART CATH AND CORONARY ANGIOGRAPHY N/A 01/25/2017   Procedure: Right/Left Heart Cath and Coronary Angiography;  Surgeon: Jolaine Artist, MD;  Location: [place] [facility];  Service: Cardiovascular;  Laterality: N/A;   SAVORY DILATION N/A 09/25/2018   Procedure: SAVORY DILATION;  Surgeon: Ronald Lobo, MD;  [facility];  Service: Endoscopy;  Laterality: N/A;   SAVORY DILATION N/A 10/29/2018   Procedure: SAVORY DILATION;  Surgeon: Ronald Lobo, MD;  Location: [facility];  Service: Endoscopy;  Laterality: N/A;   SAVORY DILATION N/A 08/20/2019   Procedure: SAVORY DILATION;  Surgeon: Otis Brace, MD;  Location: [facility];  Service: Gastroenterology;  Laterality: N/A;   SAVORY DILATION N/A 05/03/2020   Procedure: SAVORY DILATION;  Surgeon: Ronald Lobo, MD;  Location: [facility];  Service: Endoscopy;  Laterality: N/A;  Patient needs ultraslim pediatric upper endoscope/     no fluoro per dr. Cristal Deer DILATION N/A 06/02/2020   Procedure: SAVORY DILATION w/ FLURO;  Surgeon: Clarene Essex, MD;  Location: [facility];  Service: Gastroenterology;  Laterality: N/A;    Prior to Admission medications   Medication Sig Start Date End Date Taking? Authorizing  Provider  acetaminophen (TYLENOL) 500 MG tablet Take 1,000 mg by mouth every 6 (six) hours as needed for moderate pain, headache or mild pain.   Yes [provider]  amiodarone (PACERONE) 100 MG tablet Take 100 mg by mouth every other day.   Yes [provider]  atorvastatin (LIPITOR) 20 MG tablet Take 20 mg by mouth every other day.   Yes [provider]  carvedilol (COREG) 12.5 MG tablet TAKE ONE TABLET BY MOUTH TWICE DAILY WITH A MEAL Patient taking differently: Take 12.5 mg by mouth 2 (two) times daily with a meal. 07/21/21  Yes Bensimhon, Shaune Pascal, MD  cholecalciferol (VITAMIN D3) 25 MCG (1000 UNIT) tablet Take 2,000 Units by mouth every morning.   Yes [provider]  diphenhydrAMINE (BENADRYL) 25 mg capsule Take 25 mg by mouth every 6 (six) hours as needed (allergies/sleep).   Yes [provider]  ELIQUIS 2.5 MG TABS tablet TAKE ONE TABLET BY MOUTH TWICE DAILY Patient taking differently: Take 2.5 mg by mouth 2 (two) times daily. 07/21/21  Yes Camnitz, Will Hassell Done, MD  Ensure (ENSURE) Take 237 mLs by mouth daily.   Yes [provider]  hydrALAZINE (APRESOLINE) 50 MG tablet TAKE ONE TABLET BY MOUTH TWICE DAILY Patient taking differently: Take 50 mg by mouth 2 (two) times daily. 10/20/21  Yes Nahser, Wonda Cheng, MD  Menthol, Topical Analgesic, (ICY HOT EX) Apply 1 application. topically daily as needed (Knee and Hip Pain).   Yes [provider]  NOVAFERRUM 125 MG/5ML LIQD Take 5 mLs by mouth daily. 11/10/21  Yes [provider]  pantoprazole (PROTONIX) 40 MG tablet Take 40 mg by mouth in the morning and at bedtime.   Yes [provider]  Polyethyl Glycol-Propyl Glycol 0.4-0.3 % SOLN Place 1 drop into both eyes every 8 (eight) hours as needed (dryness).   Yes [provider]  polyethylene glycol (MIRALAX / GLYCOLAX) 17 g packet Take 17 g by mouth daily as needed (constipation.).   Yes [provider]   sucralfate (CARAFATE) 1 GM/10ML suspension Take 10 mLs (1 g total) by mouth 4 (four) times daily. 10/10/21 01/08/22 Yes Brahmbhatt, Parag, MD  torsemide (DEMADEX) 20 MG tablet Take 2 tablets (40 mg total) by mouth every morning for 3 days, THEN 1 tablet (20 mg total) every evening for 3 days, THEN 2 tablets (40 mg total) daily. Patient taking differently: Take 2 tablets (40 mg total) by mouth every day 12/13/21 01/18/22 Yes Milford, Maricela Bo, FNP    Scheduled Meds:  [START ON 12/28/2021] amiodarone  100 mg Oral QODAY   [START ON 12/28/2021] atorvastatin  20 mg Oral QODAY   carvedilol  12.5 mg Oral BID WC   hydrALAZINE  50 mg Oral BID   insulin aspart  0-5 Units Subcutaneous QHS   insulin aspart  0-9 Units Subcutaneous TID WC   [START ON 12/30/2021] pantoprazole  40 mg Intravenous Q12H   sodium chloride flush  3 mL Intravenous Q12H   sucralfate  1 g Oral QID   torsemide  40 mg Oral Daily   Continuous Infusions:  lactated ringers 75 mL/hr at 12/26/21 2124   pantoprazole 8 mg/hr (12/26/21 1653)   PRN Meds:.acetaminophen **OR** acetaminophen, hydrALAZINE, ondansetron **OR** ondansetron (ZOFRAN) IV, polyvinyl alcohol  Allergies as of 12/26/2021 -  Review Complete 12/26/2021  Allergen Reaction Noted   Epoetin alfa Nausea Only and Other (See Comments) 12/08/2019   Penicillins Itching and Rash 12/15/2012   Retacrit [epoetin (alfa)] Other (See Comments) 12/08/2019   Crestor [rosuvastatin] Nausea Only 02/22/2021   Hydrocodone Itching and Other (See Comments) 02/22/2021   Influenza vac split quad Swelling and Other (See Comments) 02/22/2021   Tape Other (See Comments) 02/27/2021   Cefaclor Itching 02/28/2012   Crestor [rosuvastatin calcium] Nausea Only 10/23/2016   Influenza vaccines Swelling and Other (See Comments) 09/17/2018   Lidocaine hcl Palpitations and Other (See Comments) 10/23/2016   Nsaids Other (See Comments) 12/15/2012   Percocet [oxycodone-acetaminophen] Nausea And Vomiting  02/28/2012   Prednisone Other (See Comments) 12/15/2012    Family History  Problem Relation Age of Onset   Heart attack Father    Heart disease Father    Cancer Father    Hypertension Father    Hypertension Brother    Stroke Brother    Hypertension Brother    Diabetes Son    Hyperlipidemia Son    Hypertension Son     Social History   Socioeconomic History   Marital status: Widowed    Spouse name: Not on file   Number of children: Not on file   Years of education: Not on file   Highest education level: Not on file  Occupational History   Not on file  Tobacco Use   Smoking status: Former    Packs/day: 0.75    Years: 60.00    Pack years: 45.00    Types: Cigarettes    Quit date: 11/04/2016    Years since quitting: 5.1   Smokeless tobacco: Never  Vaping Use   Vaping Use: Never used  Substance and Sexual Activity   Alcohol use: No   Drug use: No   Sexual activity: Not on file  Other Topics Concern   Not on file  Social History Narrative   Lives at [facility].   Social Determinants of Health   Financial Resource Strain: Not on file  Food Insecurity: Not on file  Transportation Needs: Not on file  Physical Activity: Not on file  Stress: Not on file  Social Connections: Not on file  Intimate Partner Violence: Not on file    Review of Systems: Review of Systems  Constitutional:  Positive for malaise/fatigue. Negative for chills, fever and weight loss.  HENT:  Negative for hearing loss and tinnitus.   Eyes:  Negative for pain and discharge.  Respiratory:  Positive for shortness of breath. Negative for cough.   Cardiovascular:  Positive for palpitations. Negative for chest pain.  Gastrointestinal:  Positive for melena and vomiting. Negative for abdominal pain, blood in stool, constipation, diarrhea, heartburn and nausea.  Genitourinary:  Negative for dysuria and urgency.  Musculoskeletal:  Negative for myalgias and neck pain.  Skin:  Negative  for itching and rash.  Neurological:  Negative for seizures and loss of consciousness.  Psychiatric/Behavioral:  Negative for substance abuse. The patient is not nervous/anxious.     Physical Exam:Physical Exam Constitutional:      Appearance: She is obese.  HENT:     Head: Normocephalic and atraumatic.     Nose: Nose normal. No congestion.     Mouth/Throat:     Pharynx: Oropharynx is clear.  Eyes:     Extraocular Movements: Extraocular movements intact.     Comments: Conjunctival pallor  Cardiovascular:     Rate and Rhythm: Normal rate and regular  rhythm.  Pulmonary:     Effort: Pulmonary effort is normal. No respiratory distress.  Abdominal:     General: Abdomen is flat. Bowel sounds are normal. There is no distension.     Palpations: Abdomen is soft. There is no mass.     Tenderness: There is no abdominal tenderness. There is no guarding or rebound.     Hernia: No hernia is present.  Musculoskeletal:        General: No swelling. Normal range of motion.     Cervical back: Normal range of motion and neck supple.  Skin:    General: Skin is warm and dry.  Neurological:     General: No focal deficit present.     Mental Status: She is alert and oriented to person, place, and time.  Psychiatric:        Mood and Affect: Mood normal.        Behavior: Behavior normal.        Thought Content: Thought content normal.        Judgment: Judgment normal.    Vital signs: Vitals:   12/27/21 0300 12/27/21 0551  BP: (!) 145/55 (!) 149/54  Pulse: 70 79  Resp: 18 16  Temp: 97.7 F (36.5 C) 98 F (36.7 C)  SpO2: 97% 98%   Last BM Date : 12/26/21    GI:  Lab Results: Recent Labs    12/26/21 1019 12/26/21 1156 12/27/21 0424  WBC  --  6.7 4.9  HGB 7.4* 7.6* 8.0*  HCT  --  25.3* 25.1*  PLT  --  270 217   BMET Recent Labs    12/26/21 1017 12/27/21 0424  NA 132* 132*  K 4.1 3.4*  CL 97* 100  CO2 26 23  GLUCOSE 272* 165*  BUN 47* 40*  CREATININE 2.20* 1.81*  CALCIUM  9.5 8.8*   LFT No results for input(s): PROT, ALBUMIN, AST, ALT, ALKPHOS, BILITOT, BILIDIR, IBILI in the last 72 hours. PT/INR No results for input(s): LABPROT, INR in the last 72 hours.   Studies/Results: No results found.  Impression: Symptomatic anemia; hx of esophageal ulcer, acute on chronic anemia - Most recent EGD 09/2021 done for same: sophageal ulcer, esophageal stenosis, medium sized hiatal hernia, no specimens collected. repeat in 2 months. - hgb 8.0, s/p 1 unit PRBCs - BUN 40, Cr. 1.81  Hypokalemia/Hyponatremia - K 3.4 - Sodium: 132  CKD stage 4  Paroxysmal atrial fibrillation - on Eliquis, last dose 3/7 AM  CHF - ECHO 09/2021: EF 50-55%  Plan: Plan for EGD tomorrow. I thoroughly discussed the procedures to include nature, alternatives, benefits, and risks including but not limited to bleeding, perforation, infection, anesthesia/cardiac and pulmonary complications. Patient provides understanding and gave verbal consent to proceed. Continue Protonix drip Can have clear liquid diet as tolerated NPO at midnight Continue anti-emetics and supportive care as needed. [facility] will follow.       LOS: 0 days   Hazelee Harbold Radford Pax  PA-C 12/27/2021, 8:19 AM  Contact #  [phone]

## 2021-12-27 NOTE — Hospital Course (Signed)
Krista Mason is a 86 y.o. female with medical history significant of anemia; afib; chronic combined CHF; DM; HTN; and HLD presenting with symptomatic anemia (sent by infusion clinic).  She has a h/o esophagitis/ulcer and esophageal stricture with recurrent anemia.  She receives infusions from the infusion clinic and she received one today and then was sent by Dr. Hollie Salk for blood transfusion.  She gets iron infusions or shots monthly for low Hgb.  When she came in today, her Hgb was low.  They called Dr. Hollie Salk and recommended blood transfusion.  She has ben feeling bad - no energy, SOB, fatigue, restlessness, her legs don't work.  Last transfusion was in December.  She has periodic dark stools, they have been black the last 3-4 days. ?

## 2021-12-27 NOTE — Consult Note (Signed)
Referring Provider: [facility] Primary Care Physician:  [facility] [place] Primary Gastroenterologist:  Dr. Alessandra Bevels  Reason for Consultation:  Symptomatic anemia  HPI: Krista Mason is a 86 y.o. female medical history significant of anemia; afib; chronic combined CHF; DM; HTN; and HLD resenting with acute on chronic anemia (sent by infusion clinic).  Gets monthly infusions for anemia. Was sent to ED by infusion clinic for low hgb (7.4 on arrival decreased from 10 last month).   Patient states over the last 3-5 days she has been experiencing shortness of breath on exertion and heaviness in her legs. States she has trouble walking from the living room to the kitchen. States she typically has intermittent dark stools due to being on oral iron, iron infusions, and iron shots. Last stool was Monday (3/6) and was solid, soft/formed, and dark. Patient is on Eliquis and last dose was 3/7 AM. She states she has had chronic dysphagia that hasn't gotten better or worsened with multiple previous EGDs. EGDs show benign stenosis not amenable to dilation. She states since her last EGD 09/2021 her nausea and vomiting has improved. Though recently she has had 3 episodes of vomiting when trying to swallow medicine. Denies NSAID use. Denies unintentional weight loss. Denies heartburn. Takes PPI '40mg'$  BID and carafate QID.  Multiple EGDs showing esophageal ulcer and esophageal stenosis: 09/2018, 10/2018, 04/2020, 06/2020, 02/2021, 05/2021, 09/2021.  02/2021: Benign-appearing esophageal stenosis, congested erythematous ulcerated mucosa in the esophagus, few mucosal papules found in the stomach, acute gastritis  8/22: Esophageal ulcer with no bleeding, benign-appearing esophageal stenosis, lesion not amenable to dilation so was not attempted.  3 hyperplastic gastric polyps, repeat EGD in 3 months  09/2021 for melena/dysphagia: esophageal ulcer, esophageal stenosis, medium sized hiatal hernia, no specimens  collected. repeat in 2 months.  Colonoscopy 07/2019: Fair prep, moderate diverticulosis in sigmoid and descending colon.  A 7 mm tubular adenoma in cecum, clips placed.  Past Medical History:  Diagnosis Date   Anemia    Atrial fibrillation (HCC)    Chronic combined systolic and diastolic CHF (congestive heart failure) ([place])    a. LV dysfcuntion felt to be related to PVCs   Chronic kidney disease, stage 4 (severe) (HCC)    Diabetes mellitus without complication (HCC)    GERD (gastroesophageal reflux disease)    Hx of echocardiogram    Echo (1/16):  EF 60-65%, no RWMA, Gr 1 DD, mod AI, mod MR, mild LAE   Hyperkalemia    Hyperlipidemia    Hypertension    NICM (nonischemic cardiomyopathy) ([place])    a. 12/2018 Echo: EF 35%, diff HK w/ septal-lateral dyssynchrony. Nl RV fxn; b. 07/2019 s/p MDT [id number] [name] CRT-P MRI SureScan device (ser #: [id number])   Orthostatic hypotension    PVC (premature ventricular contraction)    a. s/p PVC ablation in 11/2016   Rheumatic fever    Wears dentures     Past Surgical History:  Procedure Laterality Date   BALLOON DILATION N/A 07/12/2020   Procedure: BALLOON DILATION;  Surgeon: Ronald Lobo, MD;  Location: [facility];  Service: Endoscopy;  Laterality: N/A;   BIOPSY  08/20/2019   Procedure: BIOPSY;  Surgeon: Otis Brace, MD;  Location: [place];  Service: Gastroenterology;;   BIV PACEMAKER INSERTION CRT-P N/A 07/24/2019   Procedure: BIV PACEMAKER INSERTION CRT-P;  Surgeon: Constance Haw, MD;  Location: [place] [facility];  Service: Cardiovascular;  Laterality: N/A;   COLONOSCOPY WITH PROPOFOL N/A 08/22/2019   Procedure:  COLONOSCOPY WITH PROPOFOL;  Surgeon: Laurence Spates, MD;  Location: [place];  Service: Endoscopy;  Laterality: N/A;   ESOPHAGOGASTRODUODENOSCOPY N/A 02/28/2021   Procedure: ESOPHAGOGASTRODUODENOSCOPY (EGD);  Surgeon: Wilford Corner, MD;  Location: [place];  Service: Endoscopy;  Laterality: N/A;    ESOPHAGOGASTRODUODENOSCOPY (EGD) WITH PROPOFOL N/A 09/25/2018   Procedure: ESOPHAGOGASTRODUODENOSCOPY (EGD) WITH PROPOFOL;  Surgeon: Ronald Lobo, MD;  [facility];  Service: Endoscopy;  Laterality: N/A;   ESOPHAGOGASTRODUODENOSCOPY (EGD) WITH PROPOFOL N/A 10/29/2018   Procedure: ESOPHAGOGASTRODUODENOSCOPY (EGD) WITH PROPOFOL;  Surgeon: Ronald Lobo, MD;  [facility];  Service: Endoscopy;  Laterality: N/A;   ESOPHAGOGASTRODUODENOSCOPY (EGD) WITH PROPOFOL N/A 08/20/2019   Procedure: ESOPHAGOGASTRODUODENOSCOPY (EGD) WITH PROPOFOL;  Surgeon: Otis Brace, MD;  Location: [place];  Service: Gastroenterology;  Laterality: N/A;   ESOPHAGOGASTRODUODENOSCOPY (EGD) WITH PROPOFOL N/A 05/03/2020   Procedure: ESOPHAGOGASTRODUODENOSCOPY (EGD) WITH PROPOFOL;  Surgeon: Ronald Lobo, MD;  [facility];  Service: Endoscopy;  Laterality: N/A;   ESOPHAGOGASTRODUODENOSCOPY (EGD) WITH PROPOFOL N/A 06/02/2020   Procedure: ESOPHAGOGASTRODUODENOSCOPY (EGD) WITH PROPOFOL and Savory Dilatation;  Surgeon: Clarene Essex, MD;  [facility];  Service: Gastroenterology;  Laterality: N/A;   ESOPHAGOGASTRODUODENOSCOPY (EGD) WITH PROPOFOL N/A 07/12/2020   Procedure: ESOPHAGOGASTRODUODENOSCOPY (EGD) WITH PROPOFOL with dilatation;  Surgeon: Ronald Lobo, MD;  [facility];  Service: Endoscopy;  Laterality: N/A;   ESOPHAGOGASTRODUODENOSCOPY (EGD) WITH PROPOFOL N/A 05/31/2021   Procedure: ESOPHAGOGASTRODUODENOSCOPY (EGD) WITH PROPOFOL;  Surgeon: Otis Brace, MD;  [facility];  Service: Gastroenterology;  Laterality: N/A;   ESOPHAGOGASTRODUODENOSCOPY (EGD) WITH PROPOFOL N/A 10/10/2021   Procedure: ESOPHAGOGASTRODUODENOSCOPY (EGD) WITH PROPOFOL;  Surgeon: Otis Brace, MD;  [facility];  Service: Gastroenterology;  Laterality: N/A;   HEMOSTASIS CLIP PLACEMENT  08/22/2019   Procedure: HEMOSTASIS CLIP PLACEMENT;  Surgeon: Laurence Spates, MD;   Location: [facility];  Service: Endoscopy;;   KNEE SURGERY Left 2009   OVARIAN CYST REMOVAL     POLYPECTOMY  08/22/2019   Procedure: POLYPECTOMY;  Surgeon: Laurence Spates, MD;  Location: [place];  Service: Endoscopy;;   POLYPECTOMY  05/31/2021   Procedure: POLYPECTOMY;  Surgeon: Otis Brace, MD;  [facility];  Service: Gastroenterology;;   PVC ABLATION N/A 11/30/2016   Procedure: PVC Ablation;  Surgeon: Will [name], MD;  Location: [place] [facility];  Service: Cardiovascular;  Laterality: N/A;   RIGHT HEART CATH N/A 01/31/2018   Procedure: RIGHT HEART CATH;  Surgeon: Jolaine Artist, MD;  Location: [place] [facility];  Service: Cardiovascular;  Laterality: N/A;   RIGHT/LEFT HEART CATH AND CORONARY ANGIOGRAPHY N/A 01/25/2017   Procedure: Right/Left Heart Cath and Coronary Angiography;  Surgeon: Jolaine Artist, MD;  Location: [place] [facility];  Service: Cardiovascular;  Laterality: N/A;   SAVORY DILATION N/A 09/25/2018   Procedure: SAVORY DILATION;  Surgeon: Ronald Lobo, MD;  [facility];  Service: Endoscopy;  Laterality: N/A;   SAVORY DILATION N/A 10/29/2018   Procedure: SAVORY DILATION;  Surgeon: Ronald Lobo, MD;  [facility];  Service: Endoscopy;  Laterality: N/A;   SAVORY DILATION N/A 08/20/2019   Procedure: SAVORY DILATION;  Surgeon: Otis Brace, MD;  Location: [facility];  Service: Gastroenterology;  Laterality: N/A;   SAVORY DILATION N/A 05/03/2020   Procedure: SAVORY DILATION;  Surgeon: Ronald Lobo, MD;  Location: [facility];  Service: Endoscopy;  Laterality: N/A;  Patient needs ultraslim pediatric upper endoscope/     no fluoro per dr. Cristal Deer DILATION N/A 06/02/2020   Procedure: SAVORY DILATION w/ FLURO;  Surgeon: Clarene Essex, MD;  Location: [facility];  Service: Gastroenterology;  Laterality: N/A;    Prior to Admission medications   Medication Sig Start Date End Date Taking? Authorizing  Provider  acetaminophen (TYLENOL) 500 MG tablet Take 1,000 mg by mouth every 6 (six) hours as needed for moderate pain, headache or mild pain.   Yes [provider]  amiodarone (PACERONE) 100 MG tablet Take 100 mg by mouth every other day.   Yes [provider]  atorvastatin (LIPITOR) 20 MG tablet Take 20 mg by mouth every other day.   Yes [provider]  carvedilol (COREG) 12.5 MG tablet TAKE ONE TABLET BY MOUTH TWICE DAILY WITH A MEAL Patient taking differently: Take 12.5 mg by mouth 2 (two) times daily with a meal. 07/21/21  Yes Bensimhon, Shaune Pascal, MD  cholecalciferol (VITAMIN D3) 25 MCG (1000 UNIT) tablet Take 2,000 Units by mouth every morning.   Yes [provider]  diphenhydrAMINE (BENADRYL) 25 mg capsule Take 25 mg by mouth every 6 (six) hours as needed (allergies/sleep).   Yes [provider]  ELIQUIS 2.5 MG TABS tablet TAKE ONE TABLET BY MOUTH TWICE DAILY Patient taking differently: Take 2.5 mg by mouth 2 (two) times daily. 07/21/21  Yes Camnitz, Will Hassell Done, MD  Ensure (ENSURE) Take 237 mLs by mouth daily.   Yes [provider]  hydrALAZINE (APRESOLINE) 50 MG tablet TAKE ONE TABLET BY MOUTH TWICE DAILY Patient taking differently: Take 50 mg by mouth 2 (two) times daily. 10/20/21  Yes Nahser, Wonda Cheng, MD  Menthol, Topical Analgesic, (ICY HOT EX) Apply 1 application. topically daily as needed (Knee and Hip Pain).   Yes [provider]  NOVAFERRUM 125 MG/5ML LIQD Take 5 mLs by mouth daily. 11/10/21  Yes [provider]  pantoprazole (PROTONIX) 40 MG tablet Take 40 mg by mouth in the morning and at bedtime.   Yes [provider]  Polyethyl Glycol-Propyl Glycol 0.4-0.3 % SOLN Place 1 drop into both eyes every 8 (eight) hours as needed (dryness).   Yes [provider]  polyethylene glycol (MIRALAX / GLYCOLAX) 17 g packet Take 17 g by mouth daily as needed (constipation.).   Yes [provider]   sucralfate (CARAFATE) 1 GM/10ML suspension Take 10 mLs (1 g total) by mouth 4 (four) times daily. 10/10/21 01/08/22 Yes Brahmbhatt, Parag, MD  torsemide (DEMADEX) 20 MG tablet Take 2 tablets (40 mg total) by mouth every morning for 3 days, THEN 1 tablet (20 mg total) every evening for 3 days, THEN 2 tablets (40 mg total) daily. Patient taking differently: Take 2 tablets (40 mg total) by mouth every day 12/13/21 01/18/22 Yes Milford, Maricela Bo, FNP    Scheduled Meds:  [START ON 12/28/2021] amiodarone  100 mg Oral QODAY   [START ON 12/28/2021] atorvastatin  20 mg Oral QODAY   carvedilol  12.5 mg Oral BID WC   hydrALAZINE  50 mg Oral BID   insulin aspart  0-5 Units Subcutaneous QHS   insulin aspart  0-9 Units Subcutaneous TID WC   [START ON 12/30/2021] pantoprazole  40 mg Intravenous Q12H   sodium chloride flush  3 mL Intravenous Q12H   sucralfate  1 g Oral QID   torsemide  40 mg Oral Daily   Continuous Infusions:  lactated ringers 75 mL/hr at 12/26/21 2124   pantoprazole 8 mg/hr (12/26/21 1653)   PRN Meds:.acetaminophen **OR** acetaminophen, hydrALAZINE, ondansetron **OR** ondansetron (ZOFRAN) IV, polyvinyl alcohol  Allergies as of 12/26/2021 -  Review Complete 12/26/2021  Allergen Reaction Noted   Epoetin alfa Nausea Only and Other (See Comments) 12/08/2019   Penicillins Itching and Rash 12/15/2012   Retacrit [epoetin (alfa)] Other (See Comments) 12/08/2019   Crestor [rosuvastatin] Nausea Only 02/22/2021   Hydrocodone Itching and Other (See Comments) 02/22/2021   Influenza vac split quad Swelling and Other (See Comments) 02/22/2021   Tape Other (See Comments) 02/27/2021   Cefaclor Itching 02/28/2012   Crestor [rosuvastatin calcium] Nausea Only 10/23/2016   Influenza vaccines Swelling and Other (See Comments) 09/17/2018   Lidocaine hcl Palpitations and Other (See Comments) 10/23/2016   Nsaids Other (See Comments) 12/15/2012   Percocet [oxycodone-acetaminophen] Nausea And Vomiting  02/28/2012   Prednisone Other (See Comments) 12/15/2012    Family History  Problem Relation Age of Onset   Heart attack Father    Heart disease Father    Cancer Father    Hypertension Father    Hypertension Brother    Stroke Brother    Hypertension Brother    Diabetes Son    Hyperlipidemia Son    Hypertension Son     Social History   Socioeconomic History   Marital status: Widowed    Spouse name: Not on file   Number of children: Not on file   Years of education: Not on file   Highest education level: Not on file  Occupational History   Not on file  Tobacco Use   Smoking status: Former    Packs/day: 0.75    Years: 60.00    Pack years: 45.00    Types: Cigarettes    Quit date: 11/04/2016    Years since quitting: 5.1   Smokeless tobacco: Never  Vaping Use   Vaping Use: Never used  Substance and Sexual Activity   Alcohol use: No   Drug use: No   Sexual activity: Not on file  Other Topics Concern   Not on file  Social History Narrative   Lives at [facility].   Social Determinants of Health   Financial Resource Strain: Not on file  Food Insecurity: Not on file  Transportation Needs: Not on file  Physical Activity: Not on file  Stress: Not on file  Social Connections: Not on file  Intimate Partner Violence: Not on file    Review of Systems: Review of Systems  Constitutional:  Positive for malaise/fatigue. Negative for chills, fever and weight loss.  HENT:  Negative for hearing loss and tinnitus.   Eyes:  Negative for pain and discharge.  Respiratory:  Positive for shortness of breath. Negative for cough.   Cardiovascular:  Positive for palpitations. Negative for chest pain.  Gastrointestinal:  Positive for melena and vomiting. Negative for abdominal pain, blood in stool, constipation, diarrhea, heartburn and nausea.  Genitourinary:  Negative for dysuria and urgency.  Musculoskeletal:  Negative for myalgias and neck pain.  Skin:  Negative  for itching and rash.  Neurological:  Negative for seizures and loss of consciousness.  Psychiatric/Behavioral:  Negative for substance abuse. The patient is not nervous/anxious.     Physical Exam:Physical Exam Constitutional:      Appearance: She is obese.  HENT:     Head: Normocephalic and atraumatic.     Nose: Nose normal. No congestion.     Mouth/Throat:     Pharynx: Oropharynx is clear.  Eyes:     Extraocular Movements: Extraocular movements intact.     Comments: Conjunctival pallor  Cardiovascular:     Rate and Rhythm: Normal rate and regular  rhythm.  Pulmonary:     Effort: Pulmonary effort is normal. No respiratory distress.  Abdominal:     General: Abdomen is flat. Bowel sounds are normal. There is no distension.     Palpations: Abdomen is soft. There is no mass.     Tenderness: There is no abdominal tenderness. There is no guarding or rebound.     Hernia: No hernia is present.  Musculoskeletal:        General: No swelling. Normal range of motion.     Cervical back: Normal range of motion and neck supple.  Skin:    General: Skin is warm and dry.  Neurological:     General: No focal deficit present.     Mental Status: She is alert and oriented to person, place, and time.  Psychiatric:        Mood and Affect: Mood normal.        Behavior: Behavior normal.        Thought Content: Thought content normal.        Judgment: Judgment normal.    Vital signs: Vitals:   12/27/21 0300 12/27/21 0551  BP: (!) 145/55 (!) 149/54  Pulse: 70 79  Resp: 18 16  Temp: 97.7 F (36.5 C) 98 F (36.7 C)  SpO2: 97% 98%   Last BM Date : 12/26/21    GI:  Lab Results: Recent Labs    12/26/21 1019 12/26/21 1156 12/27/21 0424  WBC  --  6.7 4.9  HGB 7.4* 7.6* 8.0*  HCT  --  25.3* 25.1*  PLT  --  270 217   BMET Recent Labs    12/26/21 1017 12/27/21 0424  NA 132* 132*  K 4.1 3.4*  CL 97* 100  CO2 26 23  GLUCOSE 272* 165*  BUN 47* 40*  CREATININE 2.20* 1.81*  CALCIUM  9.5 8.8*   LFT No results for input(s): PROT, ALBUMIN, AST, ALT, ALKPHOS, BILITOT, BILIDIR, IBILI in the last 72 hours. PT/INR No results for input(s): LABPROT, INR in the last 72 hours.   Studies/Results: No results found.  Impression: Symptomatic anemia; hx of esophageal ulcer, acute on chronic anemia - Most recent EGD 09/2021 done for same: sophageal ulcer, esophageal stenosis, medium sized hiatal hernia, no specimens collected. repeat in 2 months. - hgb 8.0, s/p 1 unit PRBCs - BUN 40, Cr. 1.81  Hypokalemia/Hyponatremia - K 3.4 - Sodium: 132  CKD stage 4  Paroxysmal atrial fibrillation - on Eliquis, last dose 3/7 AM  CHF - ECHO 09/2021: EF 50-55%  Plan: Plan for EGD tomorrow. I thoroughly discussed the procedures to include nature, alternatives, benefits, and risks including but not limited to bleeding, perforation, infection, anesthesia/cardiac and pulmonary complications. Patient provides understanding and gave verbal consent to proceed. Continue Protonix drip Can have clear liquid diet as tolerated NPO at midnight Continue anti-emetics and supportive care as needed. [facility] will follow.       LOS: 0 days   Notnamed Croucher Radford Pax  PA-C 12/27/2021, 8:19 AM  Contact #  [phone]

## 2021-12-27 NOTE — Plan of Care (Signed)
  Problem: Education: Goal: Knowledge of General Education information will improve Description Including pain rating scale, medication(s)/side effects and non-pharmacologic comfort measures Outcome: Progressing   

## 2021-12-28 ENCOUNTER — Encounter (HOSPITAL_COMMUNITY)
Admission: EM | Disposition: A | Discharge status: Home / Self Care | Payer: Self-pay | Source: Home / Self Care | Attending: Internal Medicine

## 2021-12-28 ENCOUNTER — Inpatient Hospital Stay (HOSPITAL_COMMUNITY): Payer: Medicare HMO | Admitting: Anesthesiology

## 2021-12-28 ENCOUNTER — Encounter (HOSPITAL_COMMUNITY): Payer: Self-pay | Admitting: Internal Medicine

## 2021-12-28 DIAGNOSIS — K222 Esophageal obstruction: Secondary | ICD-10-CM

## 2021-12-28 DIAGNOSIS — K2210 Ulcer of esophagus without bleeding: Secondary | ICD-10-CM

## 2021-12-28 DIAGNOSIS — J449 Chronic obstructive pulmonary disease, unspecified: Secondary | ICD-10-CM

## 2021-12-28 DIAGNOSIS — E1151 Type 2 diabetes mellitus with diabetic peripheral angiopathy without gangrene: Secondary | ICD-10-CM

## 2021-12-28 HISTORY — PX: ESOPHAGOGASTRODUODENOSCOPY (EGD) WITH PROPOFOL: SHX5813

## 2021-12-28 LAB — BASIC METABOLIC PANEL
Anion gap: 9 (ref 5–15)
BUN: 30 mg/dL — ABNORMAL HIGH (ref 8–23)
CO2: 25 mmol/L (ref 22–32)
Calcium: 9 mg/dL (ref 8.9–10.3)
Chloride: 102 mmol/L (ref 98–111)
Creatinine, Ser: 1.82 mg/dL — ABNORMAL HIGH (ref 0.44–1.00)
GFR, Estimated: 27 mL/min — ABNORMAL LOW (ref >60)
Glucose, Bld: 147 mg/dL — ABNORMAL HIGH (ref 70–99)
Potassium: 3.5 mmol/L (ref 3.5–5.1)
Sodium: 136 mmol/L (ref 135–145)

## 2021-12-28 LAB — CBC
HCT: 26.8 % — ABNORMAL LOW (ref 36.0–46.0)
HCT: 30.3 % — ABNORMAL LOW (ref 36.0–46.0)
Hemoglobin: 8.7 g/dL — ABNORMAL LOW (ref 12.0–15.0)
Hemoglobin: 9.2 g/dL — ABNORMAL LOW (ref 12.0–15.0)
MCH: 27 pg (ref 26.0–34.0)
MCH: 25.9 pg — ABNORMAL LOW (ref 26.0–34.0)
MCHC: 32.5 g/dL (ref 30.0–36.0)
MCHC: 30.4 g/dL (ref 30.0–36.0)
MCV: 83.2 fL (ref 80.0–100.0)
MCV: 85.4 fL (ref 80.0–100.0)
Platelets: 232 10*3/uL (ref 150–400)
Platelets: 252 10*3/uL (ref 150–400)
RBC: 3.22 MIL/uL — ABNORMAL LOW (ref 3.87–5.11)
RBC: 3.55 MIL/uL — ABNORMAL LOW (ref 3.87–5.11)
RDW: 17.4 % — ABNORMAL HIGH (ref 11.5–15.5)
RDW: 17.4 % — ABNORMAL HIGH (ref 11.5–15.5)
WBC: 5 10*3/uL (ref 4.0–10.5)
WBC: 5.4 10*3/uL (ref 4.0–10.5)
nRBC: 0 % (ref 0.0–0.2)
nRBC: 0 % (ref 0.0–0.2)

## 2021-12-28 LAB — GLUCOSE, CAPILLARY
Glucose-Capillary: 150 mg/dL — ABNORMAL HIGH (ref 70–99)
Glucose-Capillary: 144 mg/dL — ABNORMAL HIGH (ref 70–99)
Glucose-Capillary: 135 mg/dL — ABNORMAL HIGH (ref 70–99)
Glucose-Capillary: 204 mg/dL — ABNORMAL HIGH (ref 70–99)
Glucose-Capillary: 116 mg/dL — ABNORMAL HIGH (ref 70–99)

## 2021-12-28 SURGERY — ESOPHAGOGASTRODUODENOSCOPY (EGD) WITH PROPOFOL
Anesthesia: Monitor Anesthesia Care

## 2021-12-28 MED ORDER — PROPOFOL 10 MG/ML IV BOLUS
INTRAVENOUS | Status: DC | PRN
  Administered 2021-12-28: 25 mg via INTRAVENOUS

## 2021-12-28 MED ORDER — PANTOPRAZOLE SODIUM 40 MG PO TBEC
40.0000 mg | DELAYED_RELEASE_TABLET | Freq: Two times a day (BID) | ORAL | Status: DC
  Administered 2021-12-28 – 2021-12-29 (×2): 40 mg via ORAL
  Filled 2021-12-28 (×2): qty 1

## 2021-12-28 MED ORDER — LIDOCAINE 5 % EX PTCH: 2.0000 | MEDICATED_PATCH | CUTANEOUS | 0 refills | Status: DC

## 2021-12-28 MED ORDER — SODIUM CHLORIDE 0.9 % IV SOLN: INTRAVENOUS | Status: DC

## 2021-12-28 MED ORDER — PROPOFOL 500 MG/50ML IV EMUL
INTRAVENOUS | Status: DC | PRN
  Administered 2021-12-28: 100 ug/kg/min via INTRAVENOUS

## 2021-12-28 MED ORDER — SUCRALFATE 1 GM/10ML PO SUSP: 1.0000 g | Freq: Four times a day (QID) | ORAL | 0 refills | Status: DC

## 2021-12-28 MED ORDER — PHENYLEPHRINE 40 MCG/ML (10ML) SYRINGE FOR IV PUSH (FOR BLOOD PRESSURE SUPPORT)
PREFILLED_SYRINGE | INTRAVENOUS | Status: DC | PRN
  Administered 2021-12-28: 80 ug via INTRAVENOUS

## 2021-12-28 MED ORDER — SODIUM CHLORIDE 0.9 % IV SOLN: INTRAVENOUS | Status: DC | PRN

## 2021-12-28 MED ORDER — APIXABAN 2.5 MG PO TABS: 2.5000 mg | ORAL_TABLET | Freq: Two times a day (BID) | ORAL | 1 refills | Status: DC

## 2021-12-28 SURGICAL SUPPLY — 15 items

## 2021-12-28 NOTE — Op Note (Addendum)
[facility] ?Patient Name: Krista Mason ?Procedure Date : 12/28/2021 ?MRN: [id number] ?Attending MD: [name] , MD ?Date of Birth: [birth year] ?CSN: [id number] ?Age: 86 ?Admit Type: Inpatient ?Procedure:                Upper GI endoscopy ?Indications:              Acute post hemorrhagic anemia, Dysphagia, Melena,  ?                          Follow-up of esophageal stricture ?Providers:                [name], MD, Dulcy Fanny, [facility],  ?                          Technician ?Referring MD:             [facility] ?Medicines:                Monitored Anesthesia Care ?Complications:            No immediate complications. Estimated blood loss:  ?                          Minimal. ?Estimated Blood Loss:     Estimated blood loss was minimal. ?Procedure:                Pre-Anesthesia Assessment: ?                          - Prior to the procedure, a History and Physical  ?                          was performed, and patient medications and  ?                          allergies were reviewed. The patient's tolerance of  ?                          previous anesthesia was also reviewed. The risks  ?                          and benefits of the procedure and the sedation  ?                          options and risks were discussed with the patient.  ?                          All questions were answered, and informed consent  ?                          was obtained. Prior Anticoagulants: The patient has  ?                          taken Eliquis (apixaban), last dose was 1 day prior  ?  to procedure. ASA Grade Assessment: III - A patient  ?                          with severe systemic disease. After reviewing the  ?                          risks and benefits, the patient was deemed in  ?                          satisfactory condition to undergo the procedure. ?                          After obtaining informed consent, the endoscope was  ?                          passed  under direct vision. Throughout the  ?                          procedure, the patient's blood pressure, pulse, and  ?                          oxygen saturations were monitored continuously. The  ?                          [id number]-[id number] ([id number]) Olympus endoscope was introduced  ?                          through the mouth, and advanced to the second part  ?                          of duodenum. The upper GI endoscopy was  ?                          accomplished without difficulty. The patient  ?                          tolerated the procedure well. ?Scope In: ?Scope Out: ?Findings: ?     Few linear and superficial esophageal ulcers with no bleeding and no  ?     stigmata of recent bleeding were found 25 to 35 cm from the incisors. ?     One superficial esophageal ulcer with no bleeding and no stigmata of  ?     recent bleeding was found 35 to 40 cm from the incisors. The lesion was  ?     30 mm in largest dimension. ?     One benign-appearing, intrinsic moderate (circumferential scarring or  ?     stenosis; an endoscope may pass) stenosis was found. The stenosis was  ?     traversed. A TTS dilator was passed through the scope. Dilation with a  ?     15-16.5-18 mm x 8 cm CRE balloon dilator was performed to 15 mm for 30  ?     seconds. The dilation site was examined following endoscope reinsertion  ?     and showed mild mucosal disruption and moderate improvement in luminal  ?  narrowing. ?     The stricture appeared to be located below the large esophageal ulcer  ?     and was successfully dilated. ?     There were esophageal mucosal changes suggestive of short-segment  ?     Barrett's esophagus present in the lower third of the esophagus. The  ?     maximum longitudinal extent of these mucosal changes was 1 cm in length.  ?     Biopsies were not obtained today. ?     The entire examined stomach was normal. ?     The cardia and gastric fundus were normal on retroflexion. ?     Diffuse moderate mucosal changes  characterized by dark discoloration  ?     ?related to iron use, were found in the entire duodenum. ?Impression:               - Esophageal ulcers with no bleeding and no  ?                          stigmata of recent bleeding. ?                          - Esophageal ulcer with no bleeding and no stigmata  ?                          of recent bleeding. ?                          - Benign-appearing esophageal stenosis. Dilated. ?                          - Esophageal mucosal changes suggestive of  ?                          short-segment Barrett's esophagus. ?                          - Normal stomach. ?                          - Mucosal changes in the duodenum. ?                          - No specimens collected. ?Moderate Sedation: ?     Patient did not receive moderate sedation for this procedure, but  ?     instead received monitored anesthesia care. ?Recommendation:           - Advance diet as tolerated. ?                          - Use Protonix (pantoprazole) 40 mg PO BID  ?                          indefinitely. ?                          - Use sucralfate tablets 1 gram PO QID for 2 months. ?                          -  Resume Eliquis (apixaban) at prior dose in 2 days. ?Procedure Code(s):        --- Professional --- ?                          [phone], Esophagogastroduodenoscopy, flexible,  ?                          transoral; with transendoscopic balloon dilation of  ?                          esophagus (less than 30 mm diameter) ?Diagnosis Code(s):        --- Professional --- ?                          K22.10, Ulcer of esophagus without bleeding ?                          K22.2, Esophageal obstruction ?                          K22.8, Other specified diseases of esophagus ?                          K31.89, Other diseases of stomach and duodenum ?                          D62, Acute posthemorrhagic anemia ?                          R13.10, Dysphagia, unspecified ?                          K92.1, Melena  (includes Hematochezia) ?CPT copyright 2019 [employer] Association. All rights reserved. ?The codes documented in this report are preliminary and upon coder review may  ?be revised to meet current compliance requirements. ?[name], MD ?12/28/2021 1:22:27 PM ?This report has been signed electronically. ?Number of Addenda: 0 ?

## 2021-12-28 NOTE — Discharge Instructions (Addendum)
[facility] has PCP office: [facility]: Dr. Nani Ravens is great. Heard great things about Dr. Randel Pigg ? ?I also placed a referral to orthopedics (no office at [facility] unfortunately) ?

## 2021-12-28 NOTE — Anesthesia Preprocedure Evaluation (Addendum)
Anesthesia Evaluation  ?Patient identified by MRN, date of birth, ID band ?Patient awake ? ? ? ?Reviewed: ?Allergy & Precautions, NPO status , Patient's Chart, lab work & pertinent test results, reviewed documented beta blocker date and time  ? ?Airway ?Mallampati: III ? ?TM Distance: >3 FB ?Neck ROM: Full ? ?Mouth opening: Limited Mouth Opening ? Dental ? ?(+) Edentulous Upper, Edentulous Lower, Lower Dentures, Upper Dentures ?  ?Pulmonary ?COPD, former smoker,  ?  ?Pulmonary exam normal ?breath sounds clear to auscultation ? ? ? ? ? ? Cardiovascular ?hypertension, Pt. on home beta blockers and Pt. on medications ?+ Peripheral Vascular Disease and +CHF  ?Normal cardiovascular exam+ dysrhythmias (s/p PVC ablation 2018) Atrial Fibrillation + pacemaker  ?Rhythm:Irregular Rate:Normal ? ?TTE 2022 ?1. Left ventricular ejection fraction, by estimation, is 50 to 55%. The  ?left ventricle has low normal function. The left ventricle has no regional  ?wall motion abnormalities. Left ventricular diastolic parameters are  ?consistent with Grade I diastolic  ?dysfunction (impaired relaxation).  ??2. Right ventricular systolic function is normal. The right ventricular  ?size is normal. There is normal pulmonary artery systolic pressure.  ??3. Left atrial size was severely dilated.  ??4. A small pericardial effusion is present. The pericardial effusion is  ?posterior to the left ventricle.  ??5. The mitral valve is normal in structure. Moderate mitral valve  ?regurgitation. No evidence of mitral stenosis.  ??6. The aortic valve is tricuspid. There is mild calcification of the  ?aortic valve. Aortic valve regurgitation is mild. Aortic valve  ?sclerosis/calcification is present, without any evidence of aortic  ?stenosis.  ??7. The inferior vena cava is normal in size with greater than 50%  ?respiratory variability, suggesting right atrial pressure of 3 mmHg.  ?  ?Neuro/Psych ?negative neurological  ROS ? negative psych ROS  ? GI/Hepatic ?Neg liver ROS, GERD  ,  ?Endo/Other  ?diabetes ? Renal/GU ?Renal InsufficiencyRenal disease  ?negative genitourinary ?  ?Musculoskeletal ?negative musculoskeletal ROS ?(+)  ? Abdominal ?  ?Peds ? Hematology ? ?(+) Blood dyscrasia, anemia , Lab Results ?     Component                Value               Date                 ?     WBC                      5.0                 12/28/2021           ?     HGB                      8.7 (L)             12/28/2021           ?     HCT                      26.8 (L)            12/28/2021           ?     MCV                      83.2  12/28/2021           ?     PLT                      232                 12/28/2021           ?   ?Anesthesia Other Findings ? ? Reproductive/Obstetrics ? ?  ? ? ? ? ? ? ? ? ? ? ? ? ? ?  ?  ? ? ? ? ? ? ?Anesthesia Physical ?Anesthesia Plan ? ?ASA: 3 ? ?Anesthesia Plan: MAC  ? ?Post-op Pain Management:   ? ?Induction: Intravenous ? ?PONV Risk Score and Plan: 2 and Propofol infusion and Treatment may vary due to age or medical condition ? ?Airway Management Planned: Natural Airway ? ?Additional Equipment:  ? ?Intra-op Plan:  ? ?Post-operative Plan:  ? ?Informed Consent: I have reviewed the patients History and Physical, chart, labs and discussed the procedure including the risks, benefits and alternatives for the proposed anesthesia with the patient or authorized representative who has indicated his/her understanding and acceptance.  ? ?Patient has DNR.  ?Discussed DNR with patient and Suspend DNR. ?  ?Dental advisory given ? ?Plan Discussed with: CRNA ? ?Anesthesia Plan Comments:   ? ? ? ? ? ? ?Anesthesia Quick Evaluation ? ?

## 2021-12-28 NOTE — ED Provider Notes (Signed)
[facility] Provider Note   CSN: [id number] Arrival date & time: 12/26/21  1047     History  Chief Complaint  Patient presents with   abnormal labs    Krista Mason is a 86 y.o. female.  Krista Mason is a 86 y.o. female with history of cardiomyopathy, A-fib on anticoagulation, CHF, stage IV CKD, chronic anemia, diabetes, hyperlipidemia, hypertension, who presents to the emergency department for evaluation of low hemoglobin.  Patient was at clinic today for Aranesp injection for chronic anemia, they checked lab work during this appointment and her hemoglobin was noted to be 7.4.  Patient reports over the past 3 to 4 days she has had significantly increased fatigue and dyspnea on exertion.  She denies chest pain.  She reports that over the past 2 years she has intermittently had dark tarry stools but has noticed these more persistently over the last few days.  Denies any abdominal pain, no vomiting or hematemesis.  Has required blood transfusions in the past, most recently in December.  Also complains of some chronic hip and knee pain, no new trauma or injury.Daughter at bedside helps to provide history.  Patient on Eliquis.  The history is provided by the patient and a relative.      Home Medications Prior to Admission medications   Medication Sig Start Date End Date Taking? Authorizing Provider  acetaminophen (TYLENOL) 500 MG tablet Take 1,000 mg by mouth every 6 (six) hours as needed for moderate pain, headache or mild pain.   Yes [provider]  amiodarone (PACERONE) 100 MG tablet Take 100 mg by mouth every other day.   Yes [provider]  atorvastatin (LIPITOR) 20 MG tablet Take 20 mg by mouth every other day.   Yes [provider]  carvedilol (COREG) 12.5 MG tablet TAKE ONE TABLET BY MOUTH TWICE DAILY WITH A MEAL Patient taking differently: Take 12.5 mg by mouth 2 (two) times daily with a meal. 07/21/21  Yes Bensimhon, Shaune Pascal, MD  cholecalciferol (VITAMIN D3) 25 MCG (1000 UNIT) tablet Take 2,000 Units by mouth every morning.   Yes [provider]  diphenhydrAMINE (BENADRYL) 25 mg capsule Take 25 mg by mouth every 6 (six) hours as needed (allergies/sleep).   Yes [provider]  ELIQUIS 2.5 MG TABS tablet TAKE ONE TABLET BY MOUTH TWICE DAILY Patient taking differently: Take 2.5 mg by mouth 2 (two) times daily. 07/21/21  Yes Camnitz, Will Hassell Done, MD  Ensure (ENSURE) Take 237 mLs by mouth daily.   Yes [provider]  hydrALAZINE (APRESOLINE) 50 MG tablet TAKE ONE TABLET BY MOUTH TWICE DAILY Patient taking differently: Take 50 mg by mouth 2 (two) times daily. 10/20/21  Yes Nahser, Wonda Cheng, MD  Menthol, Topical Analgesic, (ICY HOT EX) Apply 1 application. topically daily as needed (Knee and Hip Pain).   Yes [provider]  NOVAFERRUM 125 MG/5ML LIQD Take 5 mLs by mouth daily. 11/10/21  Yes [provider]  pantoprazole (PROTONIX) 40 MG tablet Take 40 mg by mouth in the morning and at bedtime.   Yes [provider]  Polyethyl Glycol-Propyl Glycol 0.4-0.3 % SOLN Place 1 drop into both eyes every 8 (eight) hours as needed (dryness).   Yes [provider]  polyethylene glycol (MIRALAX / GLYCOLAX) 17 g packet Take 17 g by mouth daily as needed (constipation.).   Yes [provider]  sucralfate (CARAFATE) 1 GM/10ML suspension Take 10 mLs (1 g total) by  mouth 4 (four) times daily. 10/10/21 01/08/22 Yes Brahmbhatt, Parag, MD  torsemide (DEMADEX) 20 MG tablet Take 2 tablets (40 mg total) by mouth every morning for 3 days, THEN 1 tablet (20 mg total) every evening for 3 days, THEN 2 tablets (40 mg total) daily. Patient taking differently: Take 2 tablets (40 mg total) by mouth every day 12/13/21 01/18/22 Yes Milford, Maricela Bo, FNP      Allergies    Epoetin alfa, Penicillins, Retacrit [epoetin (alfa)], Crestor [rosuvastatin], Hydrocodone, Influenza vac split quad,  Tape, Cefaclor, Crestor [rosuvastatin calcium], Influenza vaccines, Lidocaine hcl, Nsaids, Percocet [oxycodone-acetaminophen], and Prednisone    Review of Systems   Review of Systems  Constitutional:  Positive for fatigue. Negative for chills and fever.  HENT: Negative.    Respiratory:  Positive for shortness of breath. Negative for cough.   Cardiovascular:  Negative for chest pain and leg swelling.  Gastrointestinal:  Positive for blood in stool. Negative for abdominal pain, nausea and vomiting.  Genitourinary:  Negative for dysuria and frequency.  Musculoskeletal:  Negative for arthralgias and myalgias.  Neurological:  Positive for weakness (Generalized) and light-headedness. Negative for syncope and headaches.  All other systems reviewed and are negative.  Physical Exam Updated Vital Signs BP (!) 179/66    Pulse (!) 57    Temp 97.9 F (36.6 C) (Temporal)    Resp 19    Ht '5\' 5"'$  (1.651 m)    Wt 67.5 kg    SpO2 99%    BMI 24.76 kg/m  Physical Exam Vitals and nursing note reviewed.  Constitutional:      General: She is not in acute distress.    Appearance: Normal appearance. She is well-developed. She is not diaphoretic.     Comments: Elderly female, alert, chronically ill-appearing but in no acute distress.  HENT:     Head: Normocephalic and atraumatic.     Mouth/Throat:     Mouth: Mucous membranes are moist.     Pharynx: Oropharynx is clear.  Eyes:     General:        Right eye: No discharge.        Left eye: No discharge.  Cardiovascular:     Rate and Rhythm: Normal rate and regular rhythm.     Pulses: Normal pulses.     Heart sounds: Normal heart sounds.  Pulmonary:     Effort: Pulmonary effort is normal. No respiratory distress.     Breath sounds: Normal breath sounds. No wheezing or rales.     Comments: Respirations equal and unlabored, patient able to speak in full sentences, lungs clear to auscultation bilaterally  Abdominal:     General: Bowel sounds are normal.  There is no distension.     Palpations: Abdomen is soft. There is no mass.     Tenderness: There is no abdominal tenderness. There is no guarding.     Comments: Abdomen soft, nondistended, nontender to palpation in all quadrants without guarding or peritoneal signs  Genitourinary:    Comments: Chaperone present during rectal exam, no rectal tone, melana noted in rectal vault Musculoskeletal:        General: No deformity.     Cervical back: Neck supple.  Skin:    General: Skin is warm and dry.     Capillary Refill: Capillary refill takes less than 2 seconds.     Coloration: Skin is pale.  Neurological:     Mental Status: She is alert and oriented to person, place, and time.  Coordination: Coordination normal.     Comments: Speech is clear, able to follow commands CN III-XII intact Normal strength in upper and lower extremities bilaterally including dorsiflexion and plantar flexion, strong and equal grip strength Sensation normal to light and sharp touch Moves extremities without ataxia, coordination intact  Psychiatric:        Mood and Affect: Mood normal.        Behavior: Behavior normal.    ED Results / Procedures / Treatments   Labs (all labs ordered are listed, but only abnormal results are displayed) Labs Reviewed  CBC WITH DIFFERENTIAL/PLATELET - Abnormal; Notable for the following components:      Result Value   RBC 2.87 (*)    Hemoglobin 7.6 (*)    HCT 25.3 (*)    RDW 17.8 (*)    All other components within normal limits  CBC - Abnormal; Notable for the following components:   RBC 3.02 (*)    Hemoglobin 8.0 (*)    HCT 25.1 (*)    RDW 17.7 (*)    All other components within normal limits  BASIC METABOLIC PANEL - Abnormal; Notable for the following components:   Sodium 132 (*)    Potassium 3.4 (*)    Glucose, Bld 165 (*)    BUN 40 (*)    Creatinine, Ser 1.81 (*)    Calcium 8.8 (*)    GFR, Estimated 27 (*)    All other components within normal limits   HEMOGLOBIN A1C - Abnormal; Notable for the following components:   Hgb A1c MFr Bld 7.4 (*)    All other components within normal limits  CBC - Abnormal; Notable for the following components:   RBC 3.67 (*)    Hemoglobin 9.8 (*)    HCT 30.5 (*)    RDW 17.7 (*)    All other components within normal limits  CBC - Abnormal; Notable for the following components:   RBC 3.22 (*)    Hemoglobin 8.7 (*)    HCT 26.8 (*)    RDW 17.4 (*)    All other components within normal limits  GLUCOSE, CAPILLARY - Abnormal; Notable for the following components:   Glucose-Capillary 149 (*)    All other components within normal limits  BASIC METABOLIC PANEL - Abnormal; Notable for the following components:   Glucose, Bld 147 (*)    BUN 30 (*)    Creatinine, Ser 1.82 (*)    GFR, Estimated 27 (*)    All other components within normal limits  GLUCOSE, CAPILLARY - Abnormal; Notable for the following components:   Glucose-Capillary 146 (*)    All other components within normal limits  GLUCOSE, CAPILLARY - Abnormal; Notable for the following components:   Glucose-Capillary 155 (*)    All other components within normal limits  GLUCOSE, CAPILLARY - Abnormal; Notable for the following components:   Glucose-Capillary 150 (*)    All other components within normal limits  GLUCOSE, CAPILLARY - Abnormal; Notable for the following components:   Glucose-Capillary 144 (*)    All other components within normal limits  POC OCCULT BLOOD, ED - Abnormal; Notable for the following components:   Fecal Occult Bld POSITIVE (*)    All other components within normal limits  CBG MONITORING, ED - Abnormal; Notable for the following components:   Glucose-Capillary 155 (*)    All other components within normal limits  RESP PANEL BY RT-PCR (FLU A&B, COVID) ARPGX2  CBC  TYPE AND SCREEN  PREPARE RBC (  CROSSMATCH)    EKG EKG Interpretation  Date/Time:  Tuesday December 26 2021 17:32:14 EST Ventricular Rate:  83 PR  Interval:  59 QRS Duration: 142 QT Interval:  445 QTC Calculation: 481 R Axis:   254 Text Interpretation: VENTRICULAR PACED RHYTHM Supraventricular bigeminy Short PR interval Right bundle branch block Anterolateral infarct, old Confirmed by Blanchie Dessert [id number]) on 12/27/2021 1:18:24 PM  Radiology No results found.  Procedures .Critical Care Performed by: Jacqlyn Larsen, PA-C Authorized by: Jacqlyn Larsen, PA-C   Critical care provider statement:    Critical care time (minutes):  45   Critical care was necessary to treat or prevent imminent or life-threatening deterioration of the following conditions:  Circulatory failure (Anemia requiring blood transfusion)   Critical care was time spent personally by me on the following activities:  Development of treatment plan with patient or surrogate, discussions with consultants, evaluation of patient's response to treatment, examination of patient, ordering and review of laboratory studies, ordering and review of radiographic studies, ordering and performing treatments and interventions, pulse oximetry, re-evaluation of patient's condition and review of old charts   Care discussed with: admitting provider      Medications Ordered in ED Medications  pantoprozole (PROTONIX) 80 mg /NS 100 mL infusion (0 mg/hr Intravenous Stopped 12/28/21 1151)  pantoprazole (PROTONIX) injection 40 mg ( Intravenous Automatically Held 01/07/22 2200)  lactated ringers infusion (0 mLs Intravenous Stopped 12/28/21 1150)  acetaminophen (TYLENOL) tablet 650 mg ( Oral MAR Hold 12/28/21 1202)    Or  acetaminophen (TYLENOL) suppository 650 mg ( Rectal MAR Hold 12/28/21 1202)  ondansetron (ZOFRAN) tablet 4 mg ( Oral MAR Hold 12/28/21 1202)    Or  ondansetron (ZOFRAN) injection 4 mg ( Intravenous MAR Hold 12/28/21 1202)  hydrALAZINE (APRESOLINE) injection 5 mg ( Intravenous MAR Hold 12/28/21 1202)  insulin aspart (novoLOG) injection 0-9 Units ( Subcutaneous Automatically Held 01/12/22  1700)  insulin aspart (novoLOG) injection 0-5 Units ( Subcutaneous Automatically Held 01/11/22 2200)  sodium chloride flush (NS) 0.9 % injection 3 mL ( Intravenous Automatically Held 01/11/22 2200)  amiodarone (PACERONE) tablet 100 mg ( Oral Automatically Held 01/05/22 1000)  atorvastatin (LIPITOR) tablet 20 mg ( Oral Automatically Held 01/05/22 1000)  carvedilol (COREG) tablet 12.5 mg ( Oral Automatically Held 01/11/22 1700)  hydrALAZINE (APRESOLINE) tablet 50 mg ( Oral Automatically Held 01/11/22 2200)  sucralfate (CARAFATE) 1 GM/10ML suspension 1 g ( Oral Automatically Held 01/11/22 2200)  torsemide (DEMADEX) tablet 40 mg ( Oral Automatically Held 01/12/22 1000)  polyvinyl alcohol (LIQUIFILM TEARS) 1.4 % ophthalmic solution 1 drop ( Both Eyes MAR Hold 12/28/21 1202)  lidocaine (LIDODERM) 5 % 2 patch ( Transdermal Automatically Held 01/12/22 1015)  0.9 %  sodium chloride infusion (has no administration in time range)  0.9 %  sodium chloride infusion (has no administration in time range)  acetaminophen (TYLENOL) tablet 1,000 mg (1,000 mg Oral Given 12/26/21 1620)  0.9 %  sodium chloride infusion (0 mL/hr Intravenous Stopped 12/26/21 1938)  pantoprazole (PROTONIX) 80 mg /NS 100 mL IVPB (0 mg Intravenous Stopped 12/26/21 1722)    ED Course/ Medical Decision Making/ A&P                            This patient presents to the ED for concern of low hemoglobin, this involves an extensive number of treatment options, and is a complaint that carries with it a high risk of complications and morbidity.  The differential  diagnosis includes anemia due to CKD, iron deficiency, GI bleeding.  Increased risk as patient is on blood thinner   Co morbidities that complicate the patient evaluation  A-fib on anticoagulation, CHF, CKD   Additional history obtained:  Additional history obtained from daughter at bedside External records from outside source obtained and reviewed including recent follow-up with cardiology  and clinic visit earlier today for Aranesp injection   Lab Tests:  I Ordered, and personally interpreted labs.  The pertinent results include: Hemoglobin 7.6, no leukocytosis and normal platelets, metabolic panel done at outpatient clinic prior to arrival, creatinine at baseline, glucose 272, no other significant electrolyte derangements, iron significantly low at 18, BNP slightly elevated as well at 279.  Hemoccult is positive, type and screen pending.    Cardiac Monitoring:  The patient was maintained on a cardiac monitor.  I personally viewed and interpreted the cardiac monitored which showed an underlying rhythm of: Ventricularly paced rhythm with bigeminy   Medicines ordered and prescription drug management:  I ordered medication including Protonix drip for upper GI bleed I have reviewed the patients home medicines and have made adjustments as needed    Critical Interventions:  Blood transfusion   Consultations Obtained:  I requested consultation with Dr. Therisa Doyne with [facility],  and discussed lab and imaging findings as well as pertinent plan - they recommend: Medicine admission, will see patient in consult to determine plan for endoscopy, for now recommend holding Eliquis and keeping patient on clear liquid diet. I requested consultation with hospitalist for admission, case discussed with Dr. Karmen Bongo who will see and admit the patient.   Problem List / ED Course:  Patient sent in from clinic for low hemoglobin which is consistent on labs here in the ED, also found to have heme positive stools. 1 unit transfusion PRBCs as well as Protonix drip ordered. Will require admission for further evaluation of GI bleeding and monitoring of hemoglobin, may require additional transfusion.   Disposition: Admission          Final Clinical Impression(s) / ED Diagnoses Final diagnoses:  Symptomatic anemia  Upper GI bleeding    Rx / DC Orders ED Discharge Orders      None         Jacqlyn Larsen, [place] 12/28/21 1344    Regan Lemming, MD 12/29/21 1157

## 2021-12-28 NOTE — Anesthesia Procedure Notes (Signed)
Procedure Name: [place] ?Date/Time: 12/28/2021 1:00 PM ?Performed by: Reece Agar, CRNA ?Pre-anesthesia Checklist: Patient identified, Emergency Drugs available, Suction available and Patient being monitored ?Patient Re-evaluated:Patient Re-evaluated prior to induction ?Oxygen Delivery Method: Nasal cannula ? ? ? ? ?

## 2021-12-28 NOTE — Progress Notes (Signed)
Discharge held due to dizziness with ambulation.  Will check orthostatics.  Ambulate again in the AM and plan for discharge. ?Eulogio Bear

## 2021-12-28 NOTE — Plan of Care (Signed)
  Problem: Activity: Goal: Risk for activity intolerance will decrease Outcome: Progressing   Problem: Elimination: Goal: Will not experience complications related to urinary retention Outcome: Progressing   

## 2021-12-28 NOTE — TOC Transition Note (Signed)
Transition of Care (TOC) - CM/SW Discharge Note ? ? ?Patient Details  ?Name: Krista Mason ?MRN: [id number] ?Date of Birth: [birth year] ? ?Transition of Care (TOC) CM/SW Contact:  ?Tom-Johnson, Renea Ee, RN ?Phone Number: ?12/28/2021, 2:52 PM ? ? ?Clinical Narrative:    ? ?Patient I scheduled for discharge today. No recommendations and TOC needs noted. Daughter to transport at discharge. No further TOC needs noted. ? ?  ?  ? ? ?Patient Goals and CMS Choice ?  ?  ?  ? ?Discharge Placement ?  ?           ?  ?  ?  ?  ? ?Discharge Plan and Services ?  ?  ?           ?  ?  ?  ?  ?  ?  ?  ?  ?  ?  ? ?Social Determinants of Health (SDOH) Interventions ?  ? ? ?Readmission Risk Interventions ?No flowsheet data found. ? ? ? ? ?

## 2021-12-28 NOTE — Transfer of Care (Signed)
Immediate Anesthesia Transfer of Care Note ? ?Patient: Krista Mason ? ?Procedure(s) Performed: ESOPHAGOGASTRODUODENOSCOPY (EGD) WITH PROPOFOL ?Balloon dilation wire-guided ? ?Patient Location: PACU ? ?Anesthesia Type:MAC ? ?Level of Consciousness: awake and alert  ? ?Airway & Oxygen Therapy: Patient Spontanous Breathing and Patient connected to nasal cannula oxygen ? ?Post-op Assessment: Report given to RN and Post -op Vital signs reviewed and stable ? ?Post vital signs: Reviewed and stable ? ?Last Vitals:  ?Vitals Value Taken Time  ?BP 119/51 12/28/21 1327  ?Temp    ?Pulse 72 12/28/21 1326  ?Resp 17 12/28/21 1326  ?SpO2 99 % 12/28/21 1326  ?Vitals shown include unvalidated device data. ? ?Last Pain:  ?Vitals:  ? 12/28/21 1212  ?TempSrc: Temporal  ?PainSc: 0-No pain  ?   ? ?  ? ?Complications: No notable events documented. ?

## 2021-12-28 NOTE — Interval H&P Note (Signed)
History and Physical Interval Note: ?85/female with anemia, dysphagia, history of esophageal ulcer and stricture, last dose of Eliquis on 12/26/21 for an EGD  with possible balloon dilation. ? ?12/28/2021 ?12:47 PM ? ?Krista Mason  has presented today for EGD with possible balloon dilation with the diagnosis of anemia, history of esophageal ulcer,on eliquis.  The various methods of treatment have been discussed with the patient and family. After consideration of risks, benefits and other options for treatment, the patient has consented to  Procedure(s): ?ESOPHAGOGASTRODUODENOSCOPY (EGD) WITH PROPOFOL (N/A) as a surgical intervention.  The patient's history has been reviewed, patient examined, no change in status, stable for surgery.  I have reviewed the patient's chart and labs.  Questions were answered to the patient's satisfaction.   ? ? ?[name] ? ? ?

## 2021-12-29 DIAGNOSIS — D649 Anemia, unspecified: Secondary | ICD-10-CM | POA: Diagnosis not present

## 2021-12-29 DIAGNOSIS — K3189 Other diseases of stomach and duodenum: Secondary | ICD-10-CM | POA: Diagnosis not present

## 2021-12-29 DIAGNOSIS — K222 Esophageal obstruction: Secondary | ICD-10-CM | POA: Diagnosis not present

## 2021-12-29 DIAGNOSIS — R1310 Dysphagia, unspecified: Secondary | ICD-10-CM | POA: Diagnosis not present

## 2021-12-29 DIAGNOSIS — K921 Melena: Secondary | ICD-10-CM | POA: Diagnosis not present

## 2021-12-29 DIAGNOSIS — D62 Acute posthemorrhagic anemia: Secondary | ICD-10-CM | POA: Diagnosis not present

## 2021-12-29 DIAGNOSIS — K2289 Other specified disease of esophagus: Secondary | ICD-10-CM | POA: Diagnosis not present

## 2021-12-29 DIAGNOSIS — K2210 Ulcer of esophagus without bleeding: Secondary | ICD-10-CM | POA: Diagnosis not present

## 2021-12-29 LAB — GLUCOSE, CAPILLARY
Glucose-Capillary: 160 mg/dL — ABNORMAL HIGH (ref 70–99)
Glucose-Capillary: 174 mg/dL — ABNORMAL HIGH (ref 70–99)

## 2021-12-29 NOTE — Progress Notes (Signed)
Patient stated understanding for her discharge instructions. All questions were answered. ?

## 2021-12-29 NOTE — Progress Notes (Addendum)
[facility] Progress Note ? ?Krista Mason 86 y.o. [birth year] ? ?CC:  Anemia, dysphagia ? ? ?Subjective: ?Patient states she is doing well today. States she ate 1/3 of a hamburger yesterday after procedure and thinks she should've taken smaller bites as she felt food was stuck in her stomach. This morning with her breakfast, she states her dysphagia is improved. Denies nausea/vomiting. Denies abdominal pain. ? ?ROS : Review of Systems  ?Respiratory:  Negative for shortness of breath.   ?Cardiovascular:  Negative for chest pain and palpitations.  ?Gastrointestinal:  Negative for abdominal pain, blood in stool, constipation, diarrhea, heartburn, melena, nausea and vomiting.   ? ? ?Objective: ?Vital signs in last 24 hours: ?Vitals:  ? 12/29/21 0545 12/29/21 0915  ?BP: (!) 129/51 (!) 146/60  ?Pulse: 81 79  ?Resp: 18 17  ?Temp: 98.9 ?F (37.2 ?C) 98 ?F (36.7 ?C)  ?SpO2: 97% 100%  ? ? ?Physical Exam: ? ?General:  Alert, cooperative, no distress, sitting comfortably in chair  ?Head:  Normocephalic, without obvious abnormality, atraumatic  ?Eyes:  Anicteric sclera, EOM's intact, conjunctival pallor  ?Lungs:   Clear to auscultation bilaterally, respirations unlabored  ?Heart:  Regular rate and rhythm, S1, S2 normal  ?Abdomen:   Soft, non-tender, bowel sounds active all four quadrants,  no masses,   ?Extremities: Extremities normal, atraumatic, no  edema  ?Pulses: 2+ and symmetric  ? ? ?Lab Results: ?Recent Labs  ?  12/27/21 ?0424 12/28/21 ?2423  ?NA 132* 136  ?K 3.4* 3.5  ?CL 100 102  ?CO2 23 25  ?GLUCOSE 165* 147*  ?BUN 40* 30*  ?CREATININE 1.81* 1.82*  ?CALCIUM 8.8* 9.0  ? ?No results for input(s): AST, ALT, ALKPHOS, BILITOT, PROT, ALBUMIN in the last 72 hours. ?Recent Labs  ?  12/26/21 ?1156 12/27/21 ?0424 12/28/21 ?5361 12/28/21 ?1636  ?WBC 6.7   < > 5.0 5.4  ?NEUTROABS 4.7  --   --   --   ?HGB 7.6*   < > 8.7* 9.2*  ?HCT 25.3*   < > 26.8* 30.3*  ?MCV 88.2   < > 83.2 85.4  ?PLT 270   < > 232 252  ? < > = values  in this interval not displayed.  ? ?No results for input(s): LABPROT, INR in the last 72 hours. ? ? ? ?Assessment ?Symptomatic anemia; hx of esophageal ulcer, acute on chronic anemia ?- EGD 3/9: Esophageal ulcers with no bleeding, benign-appearing esophageal stenosis-dilated, esophageal mucosal changes suggestive of Barrett's normal stomach.  Mucosal changes in duodenum.  No specimens collected. ?- hgb 9.2, improving ?- BUN 30, Cr. 1.82 ?  ?Hypokalemia/Hyponatremia ?- K 3.5, improving ?- Sodium: 136, improving ?  ?CKD stage 4 ?  ?Paroxysmal atrial fibrillation ?- on Eliquis, last dose 3/7 AM ?  ?CHF ?- ECHO 09/2021: EF 50-55% ? ? ?Plan: ?Successful EGD with dilation yesterday, improving anemia and improving dysphagia. Discussed with patient importance of taking small bites, chewing food thoroughly and taking time with mastication. ?Continue Protonix 40 Mg p.o. twice daily indefinitely ?Continue sucralfate 1 g p.o. 4 times daily for 2 months ?Can resume Eliquis at prior dose Sunday ?Scheduled for outpatient EGD May 30th with Dr. Alessandra Bevels ?Patient can be discharged from GI standpoint. [facility] will sign off. Please contact us if we can be of any further assistance during this hospital stay.  ? ?Garnette Scheuermann PA-C ?12/29/2021, 9:21 AM ? ?Contact #  [phone]  ?

## 2021-12-29 NOTE — Anesthesia Postprocedure Evaluation (Signed)
Anesthesia Post Note ? ?Patient: Krista Mason ? ?Procedure(s) Performed: ESOPHAGOGASTRODUODENOSCOPY (EGD) WITH PROPOFOL ?Balloon dilation wire-guided ? ?  ? ?Patient location during evaluation: PACU ?Anesthesia Type: MAC ?Level of consciousness: awake and alert ?Pain management: pain level controlled ?Vital Signs Assessment: post-procedure vital signs reviewed and stable ?Respiratory status: spontaneous breathing, nonlabored ventilation, respiratory function stable and patient connected to nasal cannula oxygen ?Cardiovascular status: stable and blood pressure returned to baseline ?Postop Assessment: no apparent nausea or vomiting ?Anesthetic complications: no ? ? ?No notable events documented. ? ?Last Vitals:  ?Vitals:  ? 12/28/21 2036 12/29/21 0545  ?BP: (!) 130/54 (!) 129/51  ?Pulse: (!) 52 81  ?Resp: 18 18  ?Temp: 36.9 ?C 37.2 ?C  ?SpO2: 98% 97%  ?  ?Last Pain:  ?Vitals:  ? 12/29/21 0545  ?TempSrc: Oral  ?PainSc:   ? ? ?  ?  ?  ?  ?  ?  ? ?[name] [name] ? ? ? ? ?

## 2021-12-29 NOTE — Care Management Important Message (Signed)
Important Message ? ?Patient Details  ?Name: Krista Mason ?MRN: [id number] ?Date of Birth: 06-03-1936 ? ? ?Medicare Important Message Given:  Yes ? ? ? ? ?Challen Spainhour ?12/29/2021, 2:30 PM ?

## 2021-12-29 NOTE — Discharge Summary (Signed)
Physician Discharge Summary   Patient: Krista Mason MRN: [id number] DOB: 1936-03-02  Admit date:     12/26/2021  Discharge date: 12/29/21  Discharge Physician: Geradine Girt   PCP: [facility] [place]   Recommendations at discharge:    D/c on 3/9 held due to dizziness with ambulation Esophageal ulcer-continue PPI twice a day indefinitely, continue sucralfate 1 g 4 times a day for 2 months. Esophageal stricture-dilated with 15 mm balloon, repeat EGD scheduled as an outpatient with Dr. Alessandra Bevels on 03/20/2022. Okay to resume Eliquis from "Sunday, 12/31/2021.  Discharge Diagnoses: Principal Problem:   Symptomatic anemia Active Problems:   Acute upper GI bleed   CKD (chronic kidney disease) stage 4, GFR 15-29 ml/min (HCC)   Chronic combined systolic (congestive) and diastolic (congestive) heart failure (HCC)   Paroxysmal atrial fibrillation (HCC)   Uncontrolled hypertension   Mixed hyperlipidemia   Diabetes mellitus with complication (HCC)   DNR (do not resuscitate)   Hip pain    Hospital Course: Krista Mason is a 85 y.o. female with medical history significant of anemia; afib; chronic combined CHF; DM; HTN; and HLD presenting with symptomatic anemia (sent by infusion clinic).  She has a h/o esophagitis/ulcer and esophageal stricture with recurrent anemia.  She receives infusions from the infusion clinic and she received one today and then was sent by Dr. Upton for blood transfusion.  She gets iron infusions or shots monthly for low Hgb.  When she came in today, her Hgb was low.  They called Dr. Upton and recommended blood transfusion.  She has ben feeling bad - no energy, SOB, fatigue, restlessness, her legs don't work.  Last transfusion was in December.  She has periodic dark stools, they have been black the last 3-4 days.  Assessment and Plan: * Symptomatic anemia -Hgb stable  Acute upper GI bleed S/p EGD: Esophageal ulcer-continue PPI twice a day  indefinitely, continue sucralfate 1 g 4 times a day for 2 months. Esophageal stricture-dilated with 15 mm balloon, repeat EGD scheduled as an outpatient with Dr. Brahmbhatt on 03/20/2022. Okay to resume Eliquis from Sunday, 12/31/2021.  Paroxysmal atrial fibrillation (HCC) -Rate controlled with Coreg, Amiodarone -s/p EGD -resume eliquis on Sunday  Chronic combined systolic (congestive) and diastolic (congestive) heart failure (HCC) -09/2021 echo with ER 50-50% and grade 1 DD -Appears to be compensated at this time  CKD (chronic kidney disease) stage 4, GFR 15-29 ml/min (HCC) -Advanced but stable CKD -Needs to continue to avoid nephrotoxic medications -Closely followed by Dr. Upton  Hip pain -tylenol not helping. -avoidin nsaids -can not have norco -lidocaine patch improving -referral placed to ortho  Diabetes mellitus with complication (HCC) -A1c: 7.4 -She does not appear to be taking medications for this issue currently -Cover with sensitive-scale SSI  Mixed hyperlipidemia -Continue Lipitor  Uncontrolled hypertension -Continue home Coreg, hydralazine     Consultants: GI  Disposition: Home Diet recommendation:  Discharge Diet Orders (From admission, onward)     Start     Ordered   12/28/21 0000  Diet general        03"$ /09/23 1446           Cardiac diet DISCHARGE MEDICATION: Allergies as of 12/29/2021       Reactions   Epoetin Alfa Nausea Only, Other (See Comments)   Procrit and Epogen: "Chills, low grade fever, body aches, fatigue, nausea for 3 days"   Penicillins Itching, Rash   Amoxicillin ok- IM pen is what gives reaction  Did it involve swelling of the face/tongue/throat, SOB, or low BP? No Did it involve sudden or severe rash/hives, skin peeling, or any reaction on the inside of your mouth or nose? No Did you need to seek medical attention at a hospital or doctor's office? No When did it last happen?      10 + year If all above answers are NO, may  proceed with cephalosporin use. Other reaction(s): rash, itching   Retacrit [epoetin (alfa)] Other (See Comments)   Chills, low grade fever, body aches, fatigue, nausea for 3 days.   Crestor [rosuvastatin] Nausea Only   Hydrocodone Itching, Other (See Comments)   And the patient "got mean"   Influenza Vac Split Quad Swelling, Other (See Comments)   Local swelling and fever   Tape Other (See Comments)   The skin tears easily   Cefaclor Itching   Crestor [rosuvastatin Calcium] Nausea Only   Influenza Vaccines Swelling, Other (See Comments)   Swelling and redness at injection site, fever   Lidocaine Hcl Palpitations, Other (See Comments)   Heart racing if Lidocaine is with EPI   Nsaids Other (See Comments)   Pt states she is not taking because of kidney function   Percocet [oxycodone-acetaminophen] Nausea And Vomiting   Prednisone Other (See Comments)   Dizziness, spiked blood sugar        Medication List     TAKE these medications    acetaminophen 500 MG tablet Commonly known as: TYLENOL Take 1,000 mg by mouth every 6 (six) hours as needed for moderate pain, headache or mild pain.   amiodarone 100 MG tablet Commonly known as: PACERONE Take 100 mg by mouth every other day.   apixaban 2.5 MG Tabs tablet Commonly known as: Eliquis Take 1 tablet (2.5 mg total) by mouth 2 (two) times daily. Start taking on: December 31, 2021 What changed:  how much to take These instructions start on December 31, 2021. If you are unsure what to do until then, ask your doctor or other care provider.   atorvastatin 20 MG tablet Commonly known as: LIPITOR Take 20 mg by mouth every other day.   carvedilol 12.5 MG tablet Commonly known as: COREG TAKE ONE TABLET BY MOUTH TWICE DAILY WITH A MEAL   cholecalciferol 25 MCG (1000 UNIT) tablet Commonly known as: VITAMIN D3 Take 2,000 Units by mouth every morning.   diphenhydrAMINE 25 mg capsule Commonly known as: BENADRYL Take 25 mg by mouth every  6 (six) hours as needed (allergies/sleep).   Ensure Take 237 mLs by mouth daily.   hydrALAZINE 50 MG tablet Commonly known as: APRESOLINE TAKE ONE TABLET BY MOUTH TWICE DAILY   ICY HOT EX Apply 1 application. topically daily as needed (Knee and Hip Pain).   lidocaine 5 % Commonly known as: LIDODERM Place 2 patches onto the skin daily. Remove & Discard patch within 12 hours or as directed by MD   NovaFerrum 125 MG/5ML Liqd Generic drug: Polysaccharide Iron Complex Take 5 mLs by mouth daily.   pantoprazole 40 MG tablet Commonly known as: PROTONIX Take 40 mg by mouth in the morning and at bedtime.   Polyethyl Glycol-Propyl Glycol 0.4-0.3 % Soln Place 1 drop into both eyes every 8 (eight) hours as needed (dryness).   polyethylene glycol 17 g packet Commonly known as: MIRALAX / GLYCOLAX Take 17 g by mouth daily as needed (constipation.).   sucralfate 1 GM/10ML suspension Commonly known as: Carafate Take 10 mLs (1 g total) by mouth 4 (  four) times daily.   torsemide 20 MG tablet Commonly known as: DEMADEX Take 2 tablets (40 mg total) by mouth every morning for 3 days, THEN 1 tablet (20 mg total) every evening for 3 days, THEN 2 tablets (40 mg total) daily. Start taking on: December 13, 2021 What changed: See the new instructions.        Follow-up Information     [facility] Follow up in 1 week(s).   Specialty: Family Medicine Why: needs referral to ortho for left hip/knee pain Contact information: [place] [address] [phone]         Constance Haw, MD .   Specialty: Cardiology Contact information: [place] [address] [phone]         Bensimhon, Shaune Pascal, MD .   Specialty: Cardiology Contact information: [address] [place] [address] [phone]         Otis Brace, MD Follow up.   Specialty: Gastroenterology Why: EGD  5/23 Contact information: [address] [place] [address] [phone]                Discharge Exam: Danley Danker Weights   12/26/21 2210  Weight: 67.5 kg   In bed, NAD  Condition at discharge: good  The results of significant diagnostics from this hospitalization (including imaging, microbiology, ancillary and laboratory) are listed below for reference.   Imaging Studies: No results found.  Microbiology: Results for orders placed or performed during the hospital encounter of 12/26/21  Resp Panel by RT-PCR (Flu A&B, Covid) Nasopharyngeal Swab     Status: None   Collection Time: 12/26/21 10:47 AM   Specimen: Nasopharyngeal Swab; Nasopharyngeal(NP) swabs in vial transport medium  Result Value Ref Range Status   SARS Coronavirus 2 by RT PCR NEGATIVE NEGATIVE Final    Comment: (NOTE) SARS-CoV-2 target nucleic acids are NOT DETECTED.  The SARS-CoV-2 RNA is generally detectable in upper respiratory specimens during the acute phase of infection. The lowest concentration of SARS-CoV-2 viral copies this assay can detect is 138 copies/mL. A negative result does not preclude SARS-Cov-2 infection and should not be used as the sole basis for treatment or other patient management decisions. A negative result may occur with  improper specimen collection/handling, submission of specimen other than nasopharyngeal swab, presence of viral mutation(s) within the areas targeted by this assay, and inadequate number of viral copies(<138 copies/mL). A negative result must be combined with clinical observations, patient history, and epidemiological information. The expected result is Negative.  Fact Sheet for Patients:  EntrepreneurPulse.com.au  Fact Sheet for Healthcare Providers:  IncredibleEmployment.be  This test is no t yet approved or cleared by the [place] FDA and  has been authorized for detection and/or diagnosis of  SARS-CoV-2 by FDA under an Emergency Use Authorization (EUA). This EUA will remain  in effect (meaning this test can be used) for the duration of the COVID-19 declaration under Section 564(b)(1) of the Act, 21 U.S.C.section 360bbb-3(b)(1), unless the authorization is terminated  or revoked sooner.       Influenza A by PCR NEGATIVE NEGATIVE Final   Influenza B by PCR NEGATIVE NEGATIVE Final    Comment: (NOTE) The Xpert Xpress SARS-CoV-2/FLU/RSV plus assay is intended as an aid in the diagnosis of influenza from Nasopharyngeal swab specimens and should not be used as a sole basis for treatment. Nasal washings and aspirates are unacceptable for Xpert  Xpress SARS-CoV-2/FLU/RSV testing.  Fact Sheet for Patients: EntrepreneurPulse.com.au  Fact Sheet for Healthcare Providers: IncredibleEmployment.be  This test is not yet approved or cleared by the [place] FDA and has been authorized for detection and/or diagnosis of SARS-CoV-2 by FDA under an Emergency Use Authorization (EUA). This EUA will remain in effect (meaning this test can be used) for the duration of the COVID-19 declaration under Section 564(b)(1) of the Act, 21 U.S.C. section 360bbb-3(b)(1), unless the authorization is terminated or revoked.  Performed at [facility], [place] [address] [place], [place] [address]     Labs: CBC: Recent Labs  Lab 12/26/21 1156 12/27/21 0424 12/27/21 1625 12/28/21 0556 12/28/21 1636  WBC 6.7 4.9 6.6 5.0 5.4  NEUTROABS 4.7  --   --   --   --   HGB 7.6* 8.0* 9.8* 8.7* 9.2*  HCT 25.3* 25.1* 30.5* 26.8* 30.3*  MCV 88.2 83.1 83.1 83.2 85.4  PLT 270 217 252 232 568   Basic Metabolic Panel: Recent Labs  Lab 12/26/21 1017 12/27/21 0424 12/28/21 0556  NA 132* 132* 136  K 4.1 3.4* 3.5  CL 97* 100 102  CO2 '26 23 25  '$ GLUCOSE 272* 165* 147*  BUN 47* 40* 30*  CREATININE 2.20* 1.81* 1.82*  CALCIUM 9.5 8.8* 9.0   Liver Function  Tests: No results for input(s): AST, ALT, ALKPHOS, BILITOT, PROT, ALBUMIN in the last 168 hours. CBG: Recent Labs  Lab 12/28/21 1325 12/28/21 1655 12/28/21 2036 12/29/21 0732 12/29/21 1142  GLUCAP 135* 204* 116* 160* 174*    Discharge time spent: greater than 30 minutes.  Signed: Geradine Girt, DO [facility] 12/29/2021

## 2021-12-29 NOTE — Plan of Care (Signed)
?  Problem: Education: ?Goal: Knowledge of General Education information will improve ?Description: Including pain rating scale, medication(s)/side effects and non-pharmacologic comfort measures ?Outcome: Adequate for Discharge ?  ?Problem: Activity: ?Goal: Risk for activity intolerance will decrease ?Outcome: Adequate for Discharge ?  ?Problem: Nutrition: ?Goal: Adequate nutrition will be maintained ?Outcome: Adequate for Discharge ?  ?Problem: Elimination: ?Goal: Will not experience complications related to bowel motility ?Outcome: Adequate for Discharge ?Goal: Will not experience complications related to urinary retention ?Outcome: Adequate for Discharge ?  ?

## 2022-01-01 ENCOUNTER — Encounter (HOSPITAL_COMMUNITY): Payer: Self-pay | Admitting: Gastroenterology

## 2022-01-09 ENCOUNTER — Encounter (HOSPITAL_COMMUNITY)
Admission: RE | Admit: 2022-01-09 | Discharge: 2022-01-09 | Disposition: A | Discharge status: Home / Self Care | Payer: Medicare HMO | Source: Ambulatory Visit | Attending: Nephrology | Admitting: Nephrology

## 2022-01-09 DIAGNOSIS — N183 Chronic kidney disease, stage 3 unspecified: Secondary | ICD-10-CM | POA: Diagnosis present

## 2022-01-09 DIAGNOSIS — I5022 Chronic systolic (congestive) heart failure: Secondary | ICD-10-CM | POA: Diagnosis not present

## 2022-01-09 DIAGNOSIS — N1831 Chronic kidney disease, stage 3a: Secondary | ICD-10-CM | POA: Diagnosis present

## 2022-01-09 MED ORDER — SODIUM CHLORIDE 0.9 % IV SOLN
510.0000 mg | INTRAVENOUS | Status: DC
  Administered 2022-01-09: 510 mg via INTRAVENOUS
  Filled 2022-01-09: qty 510

## 2022-01-18 ENCOUNTER — Other Ambulatory Visit (HOSPITAL_COMMUNITY): Payer: Self-pay | Admitting: Cardiology

## 2022-01-18 ENCOUNTER — Other Ambulatory Visit (HOSPITAL_COMMUNITY): Payer: Self-pay | Admitting: Internal Medicine

## 2022-01-23 ENCOUNTER — Ambulatory Visit (HOSPITAL_COMMUNITY)
Admission: RE | Admit: 2022-01-23 | Discharge: 2022-01-23 | Disposition: A | Discharge status: Home / Self Care | Payer: Medicare HMO | Source: Ambulatory Visit | Attending: Nephrology | Admitting: Nephrology

## 2022-01-23 VITALS — BP 136/53 | HR 72 | Temp 97.3°F | Resp 18 | Wt 145.1 lb

## 2022-01-23 DIAGNOSIS — D631 Anemia in chronic kidney disease: Secondary | ICD-10-CM | POA: Insufficient documentation

## 2022-01-23 DIAGNOSIS — N184 Chronic kidney disease, stage 4 (severe): Secondary | ICD-10-CM | POA: Diagnosis not present

## 2022-01-23 LAB — POCT HEMOGLOBIN-HEMACUE: Hemoglobin: 8.3 g/dL — ABNORMAL LOW (ref 12.0–15.0)

## 2022-01-23 MED ORDER — DARBEPOETIN ALFA 100 MCG/0.5ML IJ SOSY
100.0000 ug | PREFILLED_SYRINGE | INTRAMUSCULAR | Status: DC
  Administered 2022-01-23: 100 ug via SUBCUTANEOUS

## 2022-01-23 MED ORDER — SODIUM CHLORIDE 0.9 % IV SOLN
510.0000 mg | INTRAVENOUS | Status: DC
  Administered 2022-01-23: 510 mg via INTRAVENOUS
  Filled 2022-01-23: qty 510

## 2022-01-23 MED ORDER — DARBEPOETIN ALFA 100 MCG/0.5ML IJ SOSY
PREFILLED_SYRINGE | INTRAMUSCULAR | Status: AC
  Filled 2022-01-23: qty 0.5

## 2022-01-30 ENCOUNTER — Ambulatory Visit (INDEPENDENT_AMBULATORY_CARE_PROVIDER_SITE_OTHER): Payer: Medicare HMO

## 2022-01-30 DIAGNOSIS — I428 Other cardiomyopathies: Secondary | ICD-10-CM | POA: Diagnosis not present

## 2022-01-30 LAB — CUP PACEART REMOTE DEVICE CHECK
Battery Remaining Longevity: 89 mo
Battery Voltage: 2.98 V
Brady Statistic AP VP Percent: 68.26 %
Brady Statistic AP VS Percent: 0.11 %
Brady Statistic AS VP Percent: 25.17 %
Brady Statistic AS VS Percent: 6.46 %
Brady Statistic RA Percent Paced: 68.49 %
Brady Statistic RV Percent Paced: 93.07 %
Date Time Interrogation Session: 20230410225711
Implantable Lead Implant Date: 20201002
Implantable Lead Implant Date: 20201002
Implantable Lead Implant Date: 20201002
Implantable Lead Location: 753858
Implantable Lead Location: 753859
Implantable Lead Location: 753860
Implantable Lead Model: 4598
Implantable Lead Model: 5076
Implantable Lead Model: 5076
Implantable Pulse Generator Implant Date: 20201002
Lead Channel Impedance Value: 323 Ohm
Lead Channel Impedance Value: 342 Ohm
Lead Channel Impedance Value: 380 Ohm
Lead Channel Impedance Value: 380 Ohm
Lead Channel Impedance Value: 418 Ohm
Lead Channel Impedance Value: 475 Ohm
Lead Channel Impedance Value: 475 Ohm
Lead Channel Impedance Value: 513 Ohm
Lead Channel Impedance Value: 589 Ohm
Lead Channel Impedance Value: 589 Ohm
Lead Channel Impedance Value: 608 Ohm
Lead Channel Impedance Value: 608 Ohm
Lead Channel Impedance Value: 646 Ohm
Lead Channel Impedance Value: 665 Ohm
Lead Channel Pacing Threshold Amplitude: 0.5 V
Lead Channel Pacing Threshold Amplitude: 0.625 V
Lead Channel Pacing Threshold Amplitude: 2.5 V
Lead Channel Pacing Threshold Pulse Width: 0.4 ms
Lead Channel Pacing Threshold Pulse Width: 0.4 ms
Lead Channel Pacing Threshold Pulse Width: 0.4 ms
Lead Channel Sensing Intrinsic Amplitude: 2.125 mV
Lead Channel Sensing Intrinsic Amplitude: 2.125 mV
Lead Channel Sensing Intrinsic Amplitude: 31.625 mV
Lead Channel Sensing Intrinsic Amplitude: 31.625 mV
Lead Channel Setting Pacing Amplitude: 1.5 V
Lead Channel Setting Pacing Amplitude: 2 V
Lead Channel Setting Pacing Amplitude: 2.25 V
Lead Channel Setting Pacing Pulse Width: 0.4 ms
Lead Channel Setting Pacing Pulse Width: 0.4 ms
Lead Channel Setting Sensing Sensitivity: 1.2 mV

## 2022-02-08 DIAGNOSIS — I5022 Chronic systolic (congestive) heart failure: Secondary | ICD-10-CM | POA: Diagnosis not present

## 2022-02-08 DIAGNOSIS — M19041 Primary osteoarthritis, right hand: Secondary | ICD-10-CM | POA: Diagnosis not present

## 2022-02-08 DIAGNOSIS — I129 Hypertensive chronic kidney disease with stage 1 through stage 4 chronic kidney disease, or unspecified chronic kidney disease: Secondary | ICD-10-CM | POA: Diagnosis not present

## 2022-02-08 DIAGNOSIS — D509 Iron deficiency anemia, unspecified: Secondary | ICD-10-CM | POA: Diagnosis not present

## 2022-02-08 DIAGNOSIS — N189 Chronic kidney disease, unspecified: Secondary | ICD-10-CM | POA: Diagnosis not present

## 2022-02-08 DIAGNOSIS — I4891 Unspecified atrial fibrillation: Secondary | ICD-10-CM | POA: Diagnosis not present

## 2022-02-08 DIAGNOSIS — J449 Chronic obstructive pulmonary disease, unspecified: Secondary | ICD-10-CM | POA: Diagnosis not present

## 2022-02-08 DIAGNOSIS — N184 Chronic kidney disease, stage 4 (severe): Secondary | ICD-10-CM | POA: Diagnosis not present

## 2022-02-08 DIAGNOSIS — K2210 Ulcer of esophagus without bleeding: Secondary | ICD-10-CM | POA: Diagnosis not present

## 2022-02-16 NOTE — Progress Notes (Signed)
Remote pacemaker transmission.   

## 2022-02-20 ENCOUNTER — Encounter (HOSPITAL_COMMUNITY)
Admission: RE | Admit: 2022-02-20 | Discharge: 2022-02-20 | Disposition: A | Discharge status: Home / Self Care | Payer: Medicare HMO | Source: Ambulatory Visit | Attending: Nephrology | Admitting: Nephrology

## 2022-02-20 VITALS — BP 127/99 | HR 69 | Temp 97.5°F | Resp 18

## 2022-02-20 DIAGNOSIS — N184 Chronic kidney disease, stage 4 (severe): Secondary | ICD-10-CM | POA: Insufficient documentation

## 2022-02-20 LAB — IRON AND TIBC
Iron: 28 ug/dL (ref 28–170)
Saturation Ratios: 7 % — ABNORMAL LOW (ref 10.4–31.8)
TIBC: 392 ug/dL (ref 250–450)
UIBC: 364 ug/dL

## 2022-02-20 LAB — POCT HEMOGLOBIN-HEMACUE: Hemoglobin: 9 g/dL — ABNORMAL LOW (ref 12.0–15.0)

## 2022-02-20 LAB — FERRITIN: Ferritin: 74 ng/mL (ref 11–307)

## 2022-02-20 MED ORDER — DARBEPOETIN ALFA 100 MCG/0.5ML IJ SOSY
PREFILLED_SYRINGE | INTRAMUSCULAR | Status: AC
  Filled 2022-02-20: qty 0.5

## 2022-02-20 MED ORDER — DARBEPOETIN ALFA 100 MCG/0.5ML IJ SOSY
100.0000 ug | PREFILLED_SYRINGE | INTRAMUSCULAR | Status: DC
  Administered 2022-02-20: 100 ug via SUBCUTANEOUS

## 2022-02-26 ENCOUNTER — Other Ambulatory Visit (HOSPITAL_COMMUNITY): Payer: Self-pay | Admitting: *Deleted

## 2022-02-27 ENCOUNTER — Encounter (HOSPITAL_COMMUNITY)
Admission: RE | Admit: 2022-02-27 | Discharge: 2022-02-27 | Disposition: A | Discharge status: Home / Self Care | Payer: Medicare HMO | Source: Ambulatory Visit | Attending: Nephrology | Admitting: Nephrology

## 2022-02-27 DIAGNOSIS — N184 Chronic kidney disease, stage 4 (severe): Secondary | ICD-10-CM | POA: Diagnosis not present

## 2022-02-27 MED ORDER — SODIUM CHLORIDE 0.9 % IV SOLN
510.0000 mg | INTRAVENOUS | Status: DC
  Administered 2022-02-27: 510 mg via INTRAVENOUS
  Filled 2022-02-27: qty 510

## 2022-03-06 ENCOUNTER — Encounter (HOSPITAL_COMMUNITY)
Admission: RE | Admit: 2022-03-06 | Discharge: 2022-03-06 | Disposition: A | Discharge status: Home / Self Care | Payer: Medicare HMO | Source: Ambulatory Visit | Attending: Nephrology | Admitting: Nephrology

## 2022-03-06 DIAGNOSIS — N184 Chronic kidney disease, stage 4 (severe): Secondary | ICD-10-CM | POA: Diagnosis not present

## 2022-03-06 MED ORDER — SODIUM CHLORIDE 0.9 % IV SOLN
510.0000 mg | INTRAVENOUS | Status: DC
  Administered 2022-03-06: 510 mg via INTRAVENOUS
  Filled 2022-03-06: qty 510

## 2022-03-09 ENCOUNTER — Encounter (HOSPITAL_COMMUNITY): Payer: Self-pay | Admitting: Gastroenterology

## 2022-03-09 NOTE — Progress Notes (Signed)
Attempted to obtain medical history via telephone, unable to reach at this time. HIPAA compliant voicemail message left requesting return call to pre surgical testing department. 

## 2022-03-20 ENCOUNTER — Ambulatory Visit (HOSPITAL_COMMUNITY)
Admission: RE | Admit: 2022-03-20 | Discharge: 2022-03-20 | Disposition: A | Discharge status: Home / Self Care | Payer: Medicare HMO | Source: Ambulatory Visit | Attending: Gastroenterology | Admitting: Gastroenterology

## 2022-03-20 ENCOUNTER — Other Ambulatory Visit: Payer: Self-pay

## 2022-03-20 ENCOUNTER — Ambulatory Visit (HOSPITAL_COMMUNITY): Payer: Medicare HMO | Admitting: Anesthesiology

## 2022-03-20 ENCOUNTER — Encounter (HOSPITAL_COMMUNITY): Payer: Self-pay | Admitting: Gastroenterology

## 2022-03-20 ENCOUNTER — Encounter (HOSPITAL_COMMUNITY)
Admission: RE | Disposition: A | Discharge status: Home / Self Care | Payer: Self-pay | Source: Ambulatory Visit | Attending: Gastroenterology

## 2022-03-20 ENCOUNTER — Ambulatory Visit (HOSPITAL_BASED_OUTPATIENT_CLINIC_OR_DEPARTMENT_OTHER): Payer: Medicare HMO | Admitting: Anesthesiology

## 2022-03-20 DIAGNOSIS — D649 Anemia, unspecified: Secondary | ICD-10-CM | POA: Diagnosis not present

## 2022-03-20 DIAGNOSIS — K2100 Gastro-esophageal reflux disease with esophagitis, without bleeding: Secondary | ICD-10-CM

## 2022-03-20 DIAGNOSIS — Z87891 Personal history of nicotine dependence: Secondary | ICD-10-CM

## 2022-03-20 DIAGNOSIS — D759 Disease of blood and blood-forming organs, unspecified: Secondary | ICD-10-CM | POA: Insufficient documentation

## 2022-03-20 DIAGNOSIS — E1151 Type 2 diabetes mellitus with diabetic peripheral angiopathy without gangrene: Secondary | ICD-10-CM | POA: Insufficient documentation

## 2022-03-20 DIAGNOSIS — N184 Chronic kidney disease, stage 4 (severe): Secondary | ICD-10-CM | POA: Insufficient documentation

## 2022-03-20 DIAGNOSIS — I5042 Chronic combined systolic (congestive) and diastolic (congestive) heart failure: Secondary | ICD-10-CM | POA: Diagnosis not present

## 2022-03-20 DIAGNOSIS — K222 Esophageal obstruction: Secondary | ICD-10-CM

## 2022-03-20 DIAGNOSIS — K219 Gastro-esophageal reflux disease without esophagitis: Secondary | ICD-10-CM | POA: Insufficient documentation

## 2022-03-20 DIAGNOSIS — J449 Chronic obstructive pulmonary disease, unspecified: Secondary | ICD-10-CM | POA: Diagnosis not present

## 2022-03-20 DIAGNOSIS — K449 Diaphragmatic hernia without obstruction or gangrene: Secondary | ICD-10-CM

## 2022-03-20 DIAGNOSIS — R1310 Dysphagia, unspecified: Secondary | ICD-10-CM | POA: Diagnosis not present

## 2022-03-20 DIAGNOSIS — E1122 Type 2 diabetes mellitus with diabetic chronic kidney disease: Secondary | ICD-10-CM | POA: Diagnosis not present

## 2022-03-20 DIAGNOSIS — Z7901 Long term (current) use of anticoagulants: Secondary | ICD-10-CM | POA: Insufficient documentation

## 2022-03-20 DIAGNOSIS — I110 Hypertensive heart disease with heart failure: Secondary | ICD-10-CM

## 2022-03-20 DIAGNOSIS — I4891 Unspecified atrial fibrillation: Secondary | ICD-10-CM | POA: Diagnosis not present

## 2022-03-20 DIAGNOSIS — I509 Heart failure, unspecified: Secondary | ICD-10-CM | POA: Diagnosis not present

## 2022-03-20 DIAGNOSIS — I130 Hypertensive heart and chronic kidney disease with heart failure and stage 1 through stage 4 chronic kidney disease, or unspecified chronic kidney disease: Secondary | ICD-10-CM | POA: Insufficient documentation

## 2022-03-20 HISTORY — PX: ESOPHAGOGASTRODUODENOSCOPY (EGD) WITH PROPOFOL: SHX5813

## 2022-03-20 HISTORY — PX: BALLOON DILATION: SHX5330

## 2022-03-20 SURGERY — ESOPHAGOGASTRODUODENOSCOPY (EGD) WITH PROPOFOL
Anesthesia: Monitor Anesthesia Care

## 2022-03-20 MED ORDER — PROPOFOL 10 MG/ML IV BOLUS
INTRAVENOUS | Status: AC
Start: 2022-03-20 — End: ?
  Filled 2022-03-20: qty 20

## 2022-03-20 MED ORDER — SODIUM CHLORIDE 0.9 % IV SOLN: INTRAVENOUS | Status: DC

## 2022-03-20 MED ORDER — LIDOCAINE 2% (20 MG/ML) 5 ML SYRINGE
INTRAMUSCULAR | Status: DC | PRN
  Administered 2022-03-20: 80 mg via INTRAVENOUS

## 2022-03-20 MED ORDER — PROPOFOL 10 MG/ML IV BOLUS
INTRAVENOUS | Status: DC | PRN
  Administered 2022-03-20 (×2): 20 mg via INTRAVENOUS
  Administered 2022-03-20: 50 mg via INTRAVENOUS
  Administered 2022-03-20: 20 mg via INTRAVENOUS

## 2022-03-20 SURGICAL SUPPLY — 15 items

## 2022-03-20 NOTE — Transfer of Care (Signed)
Immediate Anesthesia Transfer of Care Note  Patient: Erisa C Atlas  Procedure(s) Performed: ESOPHAGOGASTRODUODENOSCOPY (EGD) WITH PROPOFOL BALLOON DILATION  Patient Location: PACU  Anesthesia Type:MAC  Level of Consciousness: awake, alert  and oriented  Airway & Oxygen Therapy: Patient Spontanous Breathing and Patient connected to face mask oxygen  Post-op Assessment: Report given to RN and Post -op Vital signs reviewed and stable  Post vital signs: Reviewed and stable  Last Vitals:  Vitals Value Taken Time  BP    Temp    Pulse 69 03/20/22 1027  Resp 13 03/20/22 1027  SpO2 100 % 03/20/22 1027  Vitals shown include unvalidated device data.  Last Pain:  Vitals:   03/20/22 0914  TempSrc: Temporal  PainSc: 0-No pain         Complications: No notable events documented.

## 2022-03-20 NOTE — H&P (Signed)
Primary Care Physician:  [facility] @ [place] Primary Gastroenterologist:  [facility]  Reason for Visit : Outpatient EGD for dysphagia and follow-up on esophageal ulcer  HPI: Krista Mason is a 86 y.o. female with past medical history of atrial fibrillation on Eliquis, history of esophageal stricture requiring multiple dilation, history of CHF was admitted to the hospital in March 2023 with symptomatic anemia.  EGD at that time showed esophageal ulcer as well as lower esophageal stricture which was dilated with 15 mm balloon.  Patient continues to have intermittent trouble swallowing solid food and pills.  Occasional pain while swallowing.  Denies any abdominal pain, diarrhea or constipation.  Holding Eliquis for 2 days.  Past Medical History:  Diagnosis Date   Anemia    Atrial fibrillation (HCC)    Chronic combined systolic and diastolic CHF (congestive heart failure) ([place])    a. LV dysfcuntion felt to be related to PVCs   Chronic kidney disease, stage 4 (severe) (HCC)    Diabetes mellitus without complication (HCC)    GERD (gastroesophageal reflux disease)    Hx of echocardiogram    Echo (1/16):  EF 60-65%, no RWMA, Gr 1 DD, mod AI, mod MR, mild LAE   Hyperkalemia    Hyperlipidemia    Hypertension    NICM (nonischemic cardiomyopathy) ([place])    a. 12/2018 Echo: EF 35%, diff HK w/ septal-lateral dyssynchrony. Nl RV fxn; b. 07/2019 s/p MDT [id number] [name] CRT-P MRI SureScan device (ser #: [id number])   Orthostatic hypotension    PVC (premature ventricular contraction)    a. s/p PVC ablation in 11/2016   Rheumatic fever    Wears dentures     Past Surgical History:  Procedure Laterality Date   BALLOON DILATION N/A 07/12/2020   Procedure: BALLOON DILATION;  Surgeon: Ronald Lobo, MD;  Location: [facility];  Service: Endoscopy;  Laterality: N/A;   BIOPSY  08/20/2019   Procedure: BIOPSY;  Surgeon: Otis Brace, MD;  Location: [facility];  Service:  Gastroenterology;;   BIV PACEMAKER INSERTION CRT-P N/A 07/24/2019   Procedure: BIV PACEMAKER INSERTION CRT-P;  Surgeon: Constance Haw, MD;  Location: [facility];  Service: Cardiovascular;  Laterality: N/A;   COLONOSCOPY WITH PROPOFOL N/A 08/22/2019   Procedure: COLONOSCOPY WITH PROPOFOL;  Surgeon: Laurence Spates, MD;  Location: [facility];  Service: Endoscopy;  Laterality: N/A;   ESOPHAGOGASTRODUODENOSCOPY N/A 02/28/2021   Procedure: ESOPHAGOGASTRODUODENOSCOPY (EGD);  Surgeon: Wilford Corner, MD;  Location: [facility];  Service: Endoscopy;  Laterality: N/A;   ESOPHAGOGASTRODUODENOSCOPY (EGD) WITH PROPOFOL N/A 09/25/2018   Procedure: ESOPHAGOGASTRODUODENOSCOPY (EGD) WITH PROPOFOL;  Surgeon: Ronald Lobo, MD;  [facility];  Service: Endoscopy;  Laterality: N/A;   ESOPHAGOGASTRODUODENOSCOPY (EGD) WITH PROPOFOL N/A 10/29/2018   Procedure: ESOPHAGOGASTRODUODENOSCOPY (EGD) WITH PROPOFOL;  Surgeon: Ronald Lobo, MD;  [facility];  Service: Endoscopy;  Laterality: N/A;   ESOPHAGOGASTRODUODENOSCOPY (EGD) WITH PROPOFOL N/A 08/20/2019   Procedure: ESOPHAGOGASTRODUODENOSCOPY (EGD) WITH PROPOFOL;  Surgeon: Otis Brace, MD;  Location: [facility];  Service: Gastroenterology;  Laterality: N/A;   ESOPHAGOGASTRODUODENOSCOPY (EGD) WITH PROPOFOL N/A 05/03/2020   Procedure: ESOPHAGOGASTRODUODENOSCOPY (EGD) WITH PROPOFOL;  Surgeon: Ronald Lobo, MD;  [facility];  Service: Endoscopy;  Laterality: N/A;   ESOPHAGOGASTRODUODENOSCOPY (EGD) WITH PROPOFOL N/A 06/02/2020   Procedure: ESOPHAGOGASTRODUODENOSCOPY (EGD) WITH PROPOFOL and Savory Dilatation;  Surgeon: Clarene Essex, MD;  [facility];  Service: Gastroenterology;  Laterality: N/A;   ESOPHAGOGASTRODUODENOSCOPY (EGD) WITH PROPOFOL N/A 07/12/2020   Procedure: ESOPHAGOGASTRODUODENOSCOPY (EGD) WITH  PROPOFOL with dilatation;  Surgeon: Ronald Lobo, MD;  [facility];  Service: Endoscopy;   Laterality: N/A;   ESOPHAGOGASTRODUODENOSCOPY (EGD) WITH PROPOFOL N/A 05/31/2021   Procedure: ESOPHAGOGASTRODUODENOSCOPY (EGD) WITH PROPOFOL;  Surgeon: Otis Brace, MD;  [facility];  Service: Gastroenterology;  Laterality: N/A;   ESOPHAGOGASTRODUODENOSCOPY (EGD) WITH PROPOFOL N/A 10/10/2021   Procedure: ESOPHAGOGASTRODUODENOSCOPY (EGD) WITH PROPOFOL;  Surgeon: Otis Brace, MD;  [facility];  Service: Gastroenterology;  Laterality: N/A;   ESOPHAGOGASTRODUODENOSCOPY (EGD) WITH PROPOFOL N/A 12/28/2021   Procedure: ESOPHAGOGASTRODUODENOSCOPY (EGD) WITH PROPOFOL;  Surgeon: [name], MD;  Location: [facility];  Service: Gastroenterology;  Laterality: N/A;   HEMOSTASIS CLIP PLACEMENT  08/22/2019   Procedure: HEMOSTASIS CLIP PLACEMENT;  Surgeon: Laurence Spates, MD;  Location: [facility];  Service: Endoscopy;;   KNEE SURGERY Left 2009   OVARIAN CYST REMOVAL     POLYPECTOMY  08/22/2019   Procedure: POLYPECTOMY;  Surgeon: Laurence Spates, MD;  Location: [facility];  Service: Endoscopy;;   POLYPECTOMY  05/31/2021   Procedure: POLYPECTOMY;  Surgeon: Otis Brace, MD;  [facility];  Service: Gastroenterology;;   PVC ABLATION N/A 11/30/2016   Procedure: PVC Ablation;  Surgeon: Will [name], MD;  Location: [place] [facility];  Service: Cardiovascular;  Laterality: N/A;   RIGHT HEART CATH N/A 01/31/2018   Procedure: RIGHT HEART CATH;  Surgeon: Jolaine Artist, MD;  Location: [facility];  Service: Cardiovascular;  Laterality: N/A;   RIGHT/LEFT HEART CATH AND CORONARY ANGIOGRAPHY N/A 01/25/2017   Procedure: Right/Left Heart Cath and Coronary Angiography;  Surgeon: Jolaine Artist, MD;  Location: [facility];  Service: Cardiovascular;  Laterality: N/A;   SAVORY DILATION N/A 09/25/2018   Procedure: SAVORY DILATION;  Surgeon: Ronald Lobo, MD;  [facility];  Service: Endoscopy;  Laterality: N/A;   SAVORY DILATION N/A  10/29/2018   Procedure: SAVORY DILATION;  Surgeon: Ronald Lobo, MD;  [facility];  Service: Endoscopy;  Laterality: N/A;   SAVORY DILATION N/A 08/20/2019   Procedure: SAVORY DILATION;  Surgeon: Otis Brace, MD;  [facility];  Service: Gastroenterology;  Laterality: N/A;   SAVORY DILATION N/A 05/03/2020   Procedure: SAVORY DILATION;  Surgeon: Ronald Lobo, MD;  [facility];  Service: Endoscopy;  Laterality: N/A;  Patient needs ultraslim pediatric upper endoscope/     no fluoro per dr. Cristal Deer DILATION N/A 06/02/2020   Procedure: SAVORY DILATION w/ Burtis Junes;  Surgeon: Clarene Essex, MD;  [facility];  Service: Gastroenterology;  Laterality: N/A;    Prior to Admission medications   Medication Sig Start Date End Date Taking? Authorizing Provider  acetaminophen (TYLENOL) 500 MG tablet Take 500 mg by mouth at bedtime.   Yes [provider]  amiodarone (PACERONE) 100 MG tablet TAKE ONE TABLET BY MOUTH EVERY OTHER DAY Patient taking differently: Take 100 mg by mouth 3 (three) times a week. 01/18/22  Yes Camnitz, Ocie Doyne, MD  apixaban (ELIQUIS) 2.5 MG TABS tablet Take 1 tablet (2.5 mg total) by mouth 2 (two) times daily. 12/31/21  Yes Vann, Jessica U, DO  atorvastatin (LIPITOR) 20 MG tablet Take 20 mg by mouth every other day.   Yes [provider]  carvedilol (COREG) 12.5 MG tablet TAKE ONE TABLET BY MOUTH TWICE DAILY WITH A MEAL 01/18/22  Yes Bensimhon, Shaune Pascal, MD  cholecalciferol (VITAMIN D3) 25 MCG (1000 UNIT) tablet Take 1,000 Units by mouth every morning.   Yes [provider]  Cyanocobalamin (VITAMIN  B-12) 2500 MCG SUBL Place 2,500 mcg under the tongue 3 (three) times a week.   Yes [provider]  Ensure (ENSURE) Take 237 mLs by mouth daily.   Yes [provider]  hydrALAZINE (APRESOLINE) 50 MG tablet TAKE ONE TABLET BY MOUTH TWICE DAILY Patient taking differently: Take 50 mg by mouth 2  (two) times daily. 10/20/21  Yes Nahser, Wonda Cheng, MD  lidocaine (LIDODERM) 5 % Place 2 patches onto the skin daily. Remove & Discard patch within 12 hours or as directed by MD Patient taking differently: Place 1 patch onto the skin daily as needed (pain). Remove & Discard patch within 12 hours or as directed by MD 12/29/21  Yes Eulogio Bear U, DO  pantoprazole (PROTONIX) 40 MG tablet Take 40 mg by mouth in the morning and at bedtime.   Yes [provider]  Polyethyl Glycol-Propyl Glycol 0.4-0.3 % SOLN Place 1 drop into both eyes every 8 (eight) hours as needed (dryness).   Yes [provider]  polyethylene glycol (MIRALAX / GLYCOLAX) 17 g packet Take 17 g by mouth daily as needed (constipation.).   Yes [provider]  sucralfate (CARAFATE) 1 GM/10ML suspension Take 10 mLs (1 g total) by mouth 4 (four) times daily. 12/28/21 03/28/22 Yes Vann, Jessica U, DO  torsemide (DEMADEX) 20 MG tablet Take 2 tablets (40 mg total) by mouth every morning for 3 days, THEN 1 tablet (20 mg total) every evening for 3 days, THEN 2 tablets (40 mg total) daily. 12/13/21 03/20/22 Yes Milford, Maricela Bo, FNP  diphenhydrAMINE (BENADRYL) 25 mg capsule Take 25 mg by mouth every 6 (six) hours as needed (allergies/sleep).    [provider]    Scheduled Meds: Continuous Infusions:  sodium chloride     PRN Meds:.  Allergies as of 12/19/2021 - Review Complete 12/13/2021  Allergen Reaction Noted   Epoetin alfa Nausea Only and Other (See Comments) 12/08/2019   Penicillins Itching and Rash 12/15/2012   Retacrit [epoetin (alfa)] Other (See Comments) 12/08/2019   Crestor [rosuvastatin] Nausea Only 02/22/2021   Hydrocodone Itching and Other (See Comments) 02/22/2021   Influenza vac split quad Swelling and Other (See Comments) 02/22/2021   Tape Other (See Comments) 02/27/2021   Cefaclor Itching 02/28/2012   Crestor [rosuvastatin calcium] Nausea Only 10/23/2016   Influenza vaccines Swelling and  Other (See Comments) 09/17/2018   Lidocaine hcl Palpitations and Other (See Comments) 10/23/2016   Nsaids  12/15/2012   Percocet [oxycodone-acetaminophen] Nausea And Vomiting 02/28/2012   Prednisone Other (See Comments) 12/15/2012    Family History  Problem Relation Age of Onset   Heart attack Father    Heart disease Father    Cancer Father    Hypertension Father    Hypertension Brother    Stroke Brother    Hypertension Brother    Diabetes Son    Hyperlipidemia Son    Hypertension Son     Social History   Socioeconomic History   Marital status: Widowed    Spouse name: Not on file   Number of children: Not on file   Years of education: Not on file   Highest education level: Not on file  Occupational History   Not on file  Tobacco Use   Smoking status: Former    Packs/day: 0.75    Years: 60.00    Pack years: 45.00    Types: Cigarettes    Quit date: 11/04/2016    Years since quitting: 5.3   Smokeless tobacco: Never  Vaping Use   Vaping Use: Never used  Substance and Sexual Activity   Alcohol use: No   Drug use: No   Sexual activity: Not on file  Other Topics Concern   Not on file  Social History Narrative   Lives at [facility].   Social Determinants of Health   Financial Resource Strain: Not on file  Food Insecurity: Not on file  Transportation Needs: Not on file  Physical Activity: Not on file  Stress: Not on file  Social Connections: Not on file  Intimate Partner Violence: Not on file    Review of Systems: All negative except as stated above in HPI.  Physical Exam: Vital signs: Vitals:   03/20/22 0914  BP: (!) 161/94  Pulse: 71  Resp: 18  Temp: 97.8 F (36.6 C)  SpO2: 100%     General:   Alert,  Well-developed, well-nourished, pleasant and cooperative in NAD Lungs:  Clear throughout to auscultation.   No wheezes, crackles, or rhonchi. No acute distress. Heart:  Regular rate and rhythm; no murmurs, clicks, rubs,  or  gallops. Abdomen: Soft, nontender, nondistended, bowel sounds present, no peritoneal signs Rectal:  Deferred  GI:  Lab Results: No results for input(s): WBC, HGB, HCT, PLT in the last 72 hours. BMET No results for input(s): NA, K, CL, CO2, GLUCOSE, BUN, CREATININE, CALCIUM in the last 72 hours. LFT No results for input(s): PROT, ALBUMIN, AST, ALT, ALKPHOS, BILITOT, BILIDIR, IBILI in the last 72 hours. PT/INR No results for input(s): LABPROT, INR in the last 72 hours.   Studies/Results: No results found.  Impression/Plan: -Esophageal stricture -History of esophageal ulcer -Atrial fibrillation -Eliquis on hold for 2 days  Recommendation -------------------------- -Proceed with EGD with possible dilation.  Risks (bleeding, infection, bowel perforation that could require surgery, sedation-related changes in cardiopulmonary systems), benefits (identification and possible treatment of source of symptoms, exclusion of certain causes of symptoms), and alternatives (watchful waiting, radiographic imaging studies, empiric medical treatment)  were explained to patient/family in detail and patient wishes to proceed.     LOS: 0 days   Otis Brace  MD, FACP 03/20/2022, 9:38 AM  Contact #  [phone]

## 2022-03-20 NOTE — Discharge Instructions (Signed)

## 2022-03-20 NOTE — Anesthesia Preprocedure Evaluation (Addendum)
Anesthesia Evaluation  Patient identified by MRN, date of birth, ID band Patient awake    Reviewed: Allergy & Precautions, NPO status , Patient's Chart, lab work & pertinent test results  Airway Mallampati: II  TM Distance: >3 FB Neck ROM: Full    Dental  (+) Edentulous Upper, Edentulous Lower   Pulmonary COPD, former smoker,    Pulmonary exam normal breath sounds clear to auscultation       Cardiovascular hypertension, + Peripheral Vascular Disease and +CHF (EF 35%)  + dysrhythmias (on eliquis) Atrial Fibrillation + pacemaker  Rhythm:Irregular Rate:Normal     Neuro/Psych negative neurological ROS  negative psych ROS   GI/Hepatic Neg liver ROS, GERD  ,  Endo/Other  negative endocrine ROSdiabetes  Renal/GU Renal disease  negative genitourinary   Musculoskeletal negative musculoskeletal ROS (+)   Abdominal   Peds negative pediatric ROS (+)  Hematology  (+) Blood dyscrasia, anemia ,   Anesthesia Other Findings   Reproductive/Obstetrics negative OB ROS                           Anesthesia Physical Anesthesia Plan  ASA: 4  Anesthesia Plan: MAC   Post-op Pain Management:    Induction:   PONV Risk Score and Plan: 2 and Propofol infusion and Treatment may vary due to age or medical condition  Airway Management Planned: Natural Airway and Nasal Cannula  Additional Equipment: None  Intra-op Plan:   Post-operative Plan:   Informed Consent: I have reviewed the patients History and Physical, chart, labs and discussed the procedure including the risks, benefits and alternatives for the proposed anesthesia with the patient or authorized representative who has indicated his/her understanding and acceptance.   Patient has DNR.  Discussed DNR with patient and Suspend DNR.     Plan Discussed with: CRNA and Anesthesiologist  Anesthesia Plan Comments:        Anesthesia Quick  Evaluation

## 2022-03-20 NOTE — Anesthesia Postprocedure Evaluation (Signed)
Anesthesia Post Note  Patient: Krista Mason  Procedure(s) Performed: ESOPHAGOGASTRODUODENOSCOPY (EGD) WITH PROPOFOL BALLOON DILATION     Patient location during evaluation: PACU Anesthesia Type: MAC Level of consciousness: awake and alert Pain management: pain level controlled Vital Signs Assessment: post-procedure vital signs reviewed and stable Respiratory status: spontaneous breathing, nonlabored ventilation and respiratory function stable Cardiovascular status: blood pressure returned to baseline Postop Assessment: no apparent nausea or vomiting Anesthetic complications: no   No notable events documented.  Last Vitals:  Vitals:   03/20/22 1040 03/20/22 1050  BP: (!) 153/54 (!) 166/34  Pulse: 69 70  Resp: 19 (!) 23  Temp:    SpO2: 100% 99%    Last Pain:  Vitals:   03/20/22 1050  TempSrc:   PainSc: 0-No pain                 Marthenia Rolling

## 2022-03-20 NOTE — Op Note (Signed)
[facility] Patient Name: Krista Mason Procedure Date: 03/20/2022 MRN: [id number] Attending MD: Otis Brace , MD Date of Birth: 1936/06/14 CSN: [id number] Age: 86 Admit Type: Outpatient Procedure:                Upper GI endoscopy Indications:              For therapy of esophageal stricture Providers:                Otis Brace, MD, Allayne Gitelman, RN, Benetta Spar, Technician Referring MD:              Medicines:                Sedation Administered by an Anesthesia Professional Complications:            No immediate complications. Estimated Blood Loss:     Estimated blood loss was minimal. Procedure:                Pre-Anesthesia Assessment:                           - Prior to the procedure, a History and Physical                            was performed, and patient medications and                            allergies were reviewed. The patient's tolerance of                            previous anesthesia was also reviewed. The risks                            and benefits of the procedure and the sedation                            options and risks were discussed with the patient.                            All questions were answered, and informed consent                            was obtained. Prior Anticoagulants: The patient has                            taken Eliquis (apixaban), last dose was 2 days                            prior to procedure. ASA Grade Assessment: III - A                            patient with severe systemic disease. After  reviewing the risks and benefits, the patient was                            deemed in satisfactory condition to undergo the                            procedure.                           After obtaining informed consent, the endoscope was                            passed under direct vision. Throughout the                            procedure, the  patient's blood pressure, pulse, and                            oxygen saturations were monitored continuously. The                            [id number] ([id number] Olympus endoscope was introduced                            through the mouth, and advanced to the second part                            of duodenum. The upper GI endoscopy was technically                            difficult and complex due to narrowing. The patient                            tolerated the procedure well. Scope In: Scope Out: Findings:      LA Grade D (one or more mucosal breaks involving at least 75% of       esophageal circumference) esophagitis with no bleeding was found in the       distal esophagus.      One benign-appearing, intrinsic severe (stenosis; an endoscope cannot       pass) stenosis was found in the distal esophagus. This stenosis measured       6 mm (inner diameter). The stenosis was traversed after dilation. The       dilation site was examined following endoscope reinsertion and showed       moderate mucosal disruption, moderate improvement in luminal narrowing       and no perforation.      A small hiatal hernia was present.      No gross lesions were noted in the entire examined stomach.      The duodenal bulb, first portion of the duodenum and second portion of       the duodenum were normal. Impression:               - LA Grade D reflux esophagitis with no bleeding.                           -  Benign-appearing esophageal stenosis.                           - Small hiatal hernia.                           - No gross lesions in the stomach.                           - Normal duodenal bulb, first portion of the                            duodenum and second portion of the duodenum.                           - No specimens collected. Moderate Sedation:      Moderate (conscious) sedation was personally administered by an       anesthesia professional. The following parameters were  monitored: oxygen       saturation, heart rate, blood pressure, and response to care. Recommendation:           - Patient has a contact number available for                            emergencies. The signs and symptoms of potential                            delayed complications were discussed with the                            patient. Return to normal activities tomorrow.                            Written discharge instructions were provided to the                            patient.                           - Soft diet.                           - Continue present medications.                           - Use Protonix (pantoprazole) 40 mg PO BID.                           - Use sucralfate tablets 1 gram PO BID.                           - Resume Eliquis (apixaban) at prior dose in 2 days.                           - Repeat upper endoscopy in 2 months for  retreatment.                           - Return to GI office in 6 months. Procedure Code(s):        --- Professional ---                           [phone], Esophagogastroduodenoscopy, flexible,                            transoral; diagnostic, including collection of                            specimen(s) by brushing or washing, when performed                            (separate procedure) Diagnosis Code(s):        --- Professional ---                           K21.00, Gastro-esophageal reflux disease with                            esophagitis, without bleeding                           K22.2, Esophageal obstruction                           K44.9, Diaphragmatic hernia without obstruction or                            gangrene CPT copyright 2019 [employer] Association. All rights reserved. The codes documented in this report are preliminary and upon coder review may  be revised to meet current compliance requirements. Otis Brace, MD Otis Brace, MD 03/20/2022 10:32:33 AM Number of  Addenda: 0

## 2022-03-22 ENCOUNTER — Encounter (HOSPITAL_COMMUNITY): Payer: Medicare HMO

## 2022-03-27 ENCOUNTER — Ambulatory Visit (HOSPITAL_COMMUNITY)
Admission: RE | Admit: 2022-03-27 | Discharge: 2022-03-27 | Disposition: A | Discharge status: Home / Self Care | Payer: Medicare HMO | Source: Ambulatory Visit | Attending: Nephrology | Admitting: Nephrology

## 2022-03-27 VITALS — BP 150/60 | HR 68 | Temp 96.7°F | Resp 16

## 2022-03-27 DIAGNOSIS — N184 Chronic kidney disease, stage 4 (severe): Secondary | ICD-10-CM | POA: Diagnosis present

## 2022-03-27 LAB — IRON AND TIBC
Iron: 54 ug/dL (ref 28–170)
Saturation Ratios: 17 % (ref 10.4–31.8)
TIBC: 312 ug/dL (ref 250–450)
UIBC: 258 ug/dL

## 2022-03-27 LAB — POCT HEMOGLOBIN-HEMACUE: Hemoglobin: 10.9 g/dL — ABNORMAL LOW (ref 12.0–15.0)

## 2022-03-27 LAB — FERRITIN: Ferritin: 182 ng/mL (ref 11–307)

## 2022-03-27 MED ORDER — DARBEPOETIN ALFA 100 MCG/0.5ML IJ SOSY
PREFILLED_SYRINGE | INTRAMUSCULAR | Status: AC
  Administered 2022-03-27: 100 ug via SUBCUTANEOUS
  Filled 2022-03-27: qty 0.5

## 2022-03-27 MED ORDER — DARBEPOETIN ALFA 100 MCG/0.5ML IJ SOSY: 100.0000 ug | PREFILLED_SYRINGE | INTRAMUSCULAR | Status: DC

## 2022-04-12 ENCOUNTER — Other Ambulatory Visit: Payer: Self-pay | Admitting: Gastroenterology

## 2022-04-12 ENCOUNTER — Encounter (HOSPITAL_BASED_OUTPATIENT_CLINIC_OR_DEPARTMENT_OTHER): Payer: Self-pay | Admitting: *Deleted

## 2022-04-12 ENCOUNTER — Inpatient Hospital Stay (HOSPITAL_BASED_OUTPATIENT_CLINIC_OR_DEPARTMENT_OTHER)
Admission: EM | Admit: 2022-04-12 | Discharge: 2022-04-16 | DRG: 381 | Disposition: A | Discharge status: Home Health | Payer: Medicare HMO | Attending: Internal Medicine | Admitting: Internal Medicine

## 2022-04-12 ENCOUNTER — Emergency Department (HOSPITAL_BASED_OUTPATIENT_CLINIC_OR_DEPARTMENT_OTHER): Payer: Medicare HMO

## 2022-04-12 DIAGNOSIS — E1122 Type 2 diabetes mellitus with diabetic chronic kidney disease: Secondary | ICD-10-CM | POA: Diagnosis not present

## 2022-04-12 DIAGNOSIS — Z7901 Long term (current) use of anticoagulants: Secondary | ICD-10-CM | POA: Diagnosis not present

## 2022-04-12 DIAGNOSIS — Z823 Family history of stroke: Secondary | ICD-10-CM | POA: Diagnosis not present

## 2022-04-12 DIAGNOSIS — D649 Anemia, unspecified: Secondary | ICD-10-CM | POA: Diagnosis not present

## 2022-04-12 DIAGNOSIS — I480 Paroxysmal atrial fibrillation: Secondary | ICD-10-CM | POA: Diagnosis present

## 2022-04-12 DIAGNOSIS — K222 Esophageal obstruction: Secondary | ICD-10-CM | POA: Diagnosis not present

## 2022-04-12 DIAGNOSIS — N179 Acute kidney failure, unspecified: Secondary | ICD-10-CM | POA: Diagnosis present

## 2022-04-12 DIAGNOSIS — E1165 Type 2 diabetes mellitus with hyperglycemia: Secondary | ICD-10-CM | POA: Diagnosis not present

## 2022-04-12 DIAGNOSIS — Z8249 Family history of ischemic heart disease and other diseases of the circulatory system: Secondary | ICD-10-CM

## 2022-04-12 DIAGNOSIS — K921 Melena: Secondary | ICD-10-CM | POA: Diagnosis not present

## 2022-04-12 DIAGNOSIS — K922 Gastrointestinal hemorrhage, unspecified: Secondary | ICD-10-CM | POA: Diagnosis not present

## 2022-04-12 DIAGNOSIS — Z809 Family history of malignant neoplasm, unspecified: Secondary | ICD-10-CM | POA: Diagnosis not present

## 2022-04-12 DIAGNOSIS — N184 Chronic kidney disease, stage 4 (severe): Secondary | ICD-10-CM | POA: Diagnosis not present

## 2022-04-12 DIAGNOSIS — Z833 Family history of diabetes mellitus: Secondary | ICD-10-CM

## 2022-04-12 DIAGNOSIS — I130 Hypertensive heart and chronic kidney disease with heart failure and stage 1 through stage 4 chronic kidney disease, or unspecified chronic kidney disease: Secondary | ICD-10-CM | POA: Diagnosis present

## 2022-04-12 DIAGNOSIS — I951 Orthostatic hypotension: Secondary | ICD-10-CM | POA: Diagnosis present

## 2022-04-12 DIAGNOSIS — D631 Anemia in chronic kidney disease: Secondary | ICD-10-CM | POA: Diagnosis not present

## 2022-04-12 DIAGNOSIS — K2289 Other specified disease of esophagus: Secondary | ICD-10-CM | POA: Diagnosis not present

## 2022-04-12 DIAGNOSIS — R0602 Shortness of breath: Secondary | ICD-10-CM | POA: Diagnosis not present

## 2022-04-12 DIAGNOSIS — I5042 Chronic combined systolic (congestive) and diastolic (congestive) heart failure: Secondary | ICD-10-CM | POA: Diagnosis present

## 2022-04-12 DIAGNOSIS — R1310 Dysphagia, unspecified: Secondary | ICD-10-CM | POA: Diagnosis not present

## 2022-04-12 DIAGNOSIS — K2211 Ulcer of esophagus with bleeding: Principal | ICD-10-CM | POA: Diagnosis present

## 2022-04-12 DIAGNOSIS — R0600 Dyspnea, unspecified: Secondary | ICD-10-CM | POA: Diagnosis not present

## 2022-04-12 DIAGNOSIS — E785 Hyperlipidemia, unspecified: Secondary | ICD-10-CM | POA: Diagnosis present

## 2022-04-12 DIAGNOSIS — Z87891 Personal history of nicotine dependence: Secondary | ICD-10-CM

## 2022-04-12 DIAGNOSIS — Z79899 Other long term (current) drug therapy: Secondary | ICD-10-CM | POA: Diagnosis not present

## 2022-04-12 DIAGNOSIS — D62 Acute posthemorrhagic anemia: Secondary | ICD-10-CM | POA: Diagnosis not present

## 2022-04-12 DIAGNOSIS — Z66 Do not resuscitate: Secondary | ICD-10-CM | POA: Diagnosis not present

## 2022-04-12 DIAGNOSIS — I428 Other cardiomyopathies: Secondary | ICD-10-CM | POA: Diagnosis present

## 2022-04-12 LAB — CBC WITH DIFFERENTIAL/PLATELET
Abs Immature Granulocytes: 0.03 10*3/uL (ref 0.00–0.07)
Basophils Absolute: 0 10*3/uL (ref 0.0–0.1)
Basophils Relative: 0 %
Eosinophils Absolute: 0.1 10*3/uL (ref 0.0–0.5)
Eosinophils Relative: 1 %
HCT: 28.2 % — ABNORMAL LOW (ref 36.0–46.0)
Hemoglobin: 9 g/dL — ABNORMAL LOW (ref 12.0–15.0)
Immature Granulocytes: 1 %
Lymphocytes Relative: 16 %
Lymphs Abs: 1 10*3/uL (ref 0.7–4.0)
MCH: 30.1 pg (ref 26.0–34.0)
MCHC: 31.9 g/dL (ref 30.0–36.0)
MCV: 94.3 fL (ref 80.0–100.0)
Monocytes Absolute: 0.4 10*3/uL (ref 0.1–1.0)
Monocytes Relative: 7 %
Neutro Abs: 4.3 10*3/uL (ref 1.7–7.7)
Neutrophils Relative %: 75 %
Platelets: 224 10*3/uL (ref 150–400)
RBC: 2.99 MIL/uL — ABNORMAL LOW (ref 3.87–5.11)
RDW: 19.5 % — ABNORMAL HIGH (ref 11.5–15.5)
WBC: 5.8 10*3/uL (ref 4.0–10.5)
nRBC: 0.3 % — ABNORMAL HIGH (ref 0.0–0.2)

## 2022-04-12 LAB — COMPREHENSIVE METABOLIC PANEL
ALT: 12 U/L (ref 0–44)
AST: 14 U/L — ABNORMAL LOW (ref 15–41)
Albumin: 3.8 g/dL (ref 3.5–5.0)
Alkaline Phosphatase: 75 U/L (ref 38–126)
Anion gap: 11 (ref 5–15)
BUN: 53 mg/dL — ABNORMAL HIGH (ref 8–23)
CO2: 27 mmol/L (ref 22–32)
Calcium: 9.8 mg/dL (ref 8.9–10.3)
Chloride: 92 mmol/L — ABNORMAL LOW (ref 98–111)
Creatinine, Ser: 2.17 mg/dL — ABNORMAL HIGH (ref 0.44–1.00)
GFR, Estimated: 22 mL/min — ABNORMAL LOW (ref >60)
Glucose, Bld: 421 mg/dL — ABNORMAL HIGH (ref 70–99)
Potassium: 3.9 mmol/L (ref 3.5–5.1)
Sodium: 130 mmol/L — ABNORMAL LOW (ref 135–145)
Total Bilirubin: 0.5 mg/dL (ref 0.3–1.2)
Total Protein: 7 g/dL (ref 6.5–8.1)

## 2022-04-12 LAB — CBG MONITORING, ED: Glucose-Capillary: 225 mg/dL — ABNORMAL HIGH (ref 70–99)

## 2022-04-12 LAB — TROPONIN I (HIGH SENSITIVITY)
Troponin I (High Sensitivity): 16 ng/L (ref <18)
Troponin I (High Sensitivity): 14 ng/L (ref <18)

## 2022-04-12 LAB — HEMOGLOBIN AND HEMATOCRIT, BLOOD
HCT: 27.7 % — ABNORMAL LOW (ref 36.0–46.0)
Hemoglobin: 9.1 g/dL — ABNORMAL LOW (ref 12.0–15.0)

## 2022-04-12 LAB — BRAIN NATRIURETIC PEPTIDE: B Natriuretic Peptide: 195.5 pg/mL — ABNORMAL HIGH (ref 0.0–100.0)

## 2022-04-12 LAB — OCCULT BLOOD X 1 CARD TO LAB, STOOL: Fecal Occult Bld: POSITIVE — AB

## 2022-04-12 MED ORDER — ONDANSETRON HCL 4 MG/2ML IJ SOLN: 4.0000 mg | Freq: Four times a day (QID) | INTRAMUSCULAR | Status: DC | PRN

## 2022-04-12 MED ORDER — ACETAMINOPHEN 325 MG PO TABS
650.0000 mg | ORAL_TABLET | Freq: Four times a day (QID) | ORAL | Status: DC | PRN
  Administered 2022-04-14: 650 mg via ORAL
  Filled 2022-04-12: qty 2

## 2022-04-12 MED ORDER — PANTOPRAZOLE SODIUM 40 MG IV SOLR
40.0000 mg | Freq: Once | INTRAVENOUS | Status: AC
Start: 2022-04-12 — End: 2022-04-12
  Administered 2022-04-12: 40 mg via INTRAVENOUS
  Filled 2022-04-12: qty 10

## 2022-04-12 MED ORDER — HYDROXYZINE HCL 25 MG PO TABS
25.0000 mg | ORAL_TABLET | Freq: Once | ORAL | Status: AC
Start: 2022-04-12 — End: 2022-04-12
  Administered 2022-04-12: 25 mg via ORAL
  Filled 2022-04-12: qty 1

## 2022-04-12 MED ORDER — POLYETHYL GLYCOL-PROPYL GLYCOL 0.4-0.3 % OP SOLN: 1.0000 [drp] | Freq: Three times a day (TID) | OPHTHALMIC | Status: DC | PRN

## 2022-04-12 MED ORDER — HYDRALAZINE HCL 50 MG PO TABS
50.0000 mg | ORAL_TABLET | Freq: Two times a day (BID) | ORAL | Status: DC
  Administered 2022-04-12 – 2022-04-16 (×8): 50 mg via ORAL
  Filled 2022-04-12 (×8): qty 1

## 2022-04-12 MED ORDER — PANTOPRAZOLE INFUSION (NEW) - SIMPLE MED
8.0000 mg/h | INTRAVENOUS | Status: DC
  Administered 2022-04-12 – 2022-04-14 (×5): 8 mg/h via INTRAVENOUS
  Filled 2022-04-12 (×2): qty 80
  Filled 2022-04-12: qty 100
  Filled 2022-04-12: qty 80
  Filled 2022-04-12 (×2): qty 100
  Filled 2022-04-12: qty 80
  Filled 2022-04-12: qty 100

## 2022-04-12 MED ORDER — SUCRALFATE 1 GM/10ML PO SUSP
1.0000 g | Freq: Three times a day (TID) | ORAL | Status: DC
  Administered 2022-04-12 – 2022-04-16 (×14): 1 g via ORAL
  Filled 2022-04-12 (×14): qty 10

## 2022-04-12 MED ORDER — AMIODARONE HCL 200 MG PO TABS
100.0000 mg | ORAL_TABLET | ORAL | Status: DC
  Administered 2022-04-13 – 2022-04-16 (×2): 100 mg via ORAL
  Filled 2022-04-12 (×2): qty 1

## 2022-04-12 MED ORDER — PANTOPRAZOLE SODIUM 40 MG IV SOLR: 40.0000 mg | Freq: Two times a day (BID) | INTRAVENOUS | Status: DC

## 2022-04-12 MED ORDER — ACETAMINOPHEN 650 MG RE SUPP: 650.0000 mg | Freq: Four times a day (QID) | RECTAL | Status: DC | PRN

## 2022-04-12 MED ORDER — PANTOPRAZOLE SODIUM 40 MG IV SOLR
40.0000 mg | Freq: Once | INTRAVENOUS | Status: AC
  Administered 2022-04-12: 40 mg via INTRAVENOUS
  Filled 2022-04-12: qty 10

## 2022-04-12 MED ORDER — POLYVINYL ALCOHOL 1.4 % OP SOLN
1.0000 [drp] | Freq: Three times a day (TID) | OPHTHALMIC | Status: DC | PRN
  Filled 2022-04-12: qty 15

## 2022-04-12 MED ORDER — ATORVASTATIN CALCIUM 10 MG PO TABS
20.0000 mg | ORAL_TABLET | ORAL | Status: DC
  Administered 2022-04-13 – 2022-04-15 (×2): 20 mg via ORAL
  Filled 2022-04-12 (×2): qty 2

## 2022-04-12 MED ORDER — ONDANSETRON HCL 4 MG PO TABS: 4.0000 mg | ORAL_TABLET | Freq: Four times a day (QID) | ORAL | Status: DC | PRN

## 2022-04-12 MED ORDER — CARVEDILOL 12.5 MG PO TABS
12.5000 mg | ORAL_TABLET | Freq: Two times a day (BID) | ORAL | Status: DC
  Administered 2022-04-13 – 2022-04-16 (×6): 12.5 mg via ORAL
  Filled 2022-04-12 (×6): qty 1

## 2022-04-12 NOTE — ED Notes (Signed)
ED Provider at bedside. 

## 2022-04-12 NOTE — ED Notes (Signed)
Pt. Has noted O2 at 2liters n/c and is in no distress at present time.  Pt. Aware of where she will be transported and why.

## 2022-04-12 NOTE — Assessment & Plan Note (Signed)
Stop eliquis Cont amiodarone Cont Coreg

## 2022-04-12 NOTE — ED Triage Notes (Signed)
86yo female presents today to the ED with complaints of shortness of breath "on and off" since fathers day. Also feels week, with exertion with have chest aching as well. Appears weak and required assistance to exam room via wc

## 2022-04-12 NOTE — Progress Notes (Signed)
Plan of Care Note for accepted transfer   Patient: Krista Mason MRN: [id number]   DOA: 04/12/2022  Facility requesting transfer: [facility] Requesting Provider: Maryan Rued Reason for transfer: GI bleed Facility course: Krista Mason is a 86 y.o. female with medical history significant of anemia; afib on Eliquis; chronic combined CHF; DM; HTN; and HLD presenting with symptomatic anemia (sent by infusion clinic).  She has a h/o esophagitis/ulcer and esophageal stricture with recurrent anemia.  She was last admitted from 3/7-10 with esophageal ulcer on PPI, Carafate and also with esophageal stricture that was dilated and scheduled for repeat EGD on 5/30 - which showed grade D esophagitis and benign-appearing intrinsic severe stenosis in the distal esophagus.  She presented today with recurrent GI bleeding.  Black stools all week.  Tired, SOB.  Dropped Hgb 2 grams in 2 weeks.  She is profoundly SOB with any movement, improves with rest.  She does not look fluid overloaded.  Given Protonix.  GI does not plan to scope but will consult.       Plan of care: The patient is accepted for admission to Telemetry unit, at [facility] or [facility].  Author: Karmen Bongo, MD 04/12/2022  Check [email or web address] for on-call coverage.  Nursing staff, Please call [place] number on Amion as soon as patient's arrival, so appropriate admitting provider can evaluate the pt.

## 2022-04-12 NOTE — ED Notes (Signed)
Occult Blood Card obtained by ED P

## 2022-04-12 NOTE — Consult Note (Incomplete)
Referring Provider: [facility] Primary Care Physician:  [facility] [place] Primary Gastroenterologist:  Dr. Alessandra Bevels  Reason for Consultation: Symptomatic anemia  HPI: Krista Mason is a 86 y.o. female  medical history significant of anemia; afib on Eliquis; chronic combined CHF; DM; HTN; and HLD presents for evaluation of symptomatic anemia, acute on chronic.  Recently admitted 3/7 to 12/29/2021 with esophageal ulcer on PPI and Carafate.  EGD during that time showed nonbleeding esophageal ulcers and benign-appearing esophageal stenosis that was dilated.  Repeat EGD 5/30 showed grade D esophagitis and benign-appearing intrinsic severe stenosis in the distal esophagus.Scheduled for repeat EGD 8/1 at [facility] with Dr. Boneta Lucks to [place] ED with dark stools, fatigue, shortness of breath. Transferred for admission. Hgb on admission 9.0 (baseline appears 8-10).    Multiple EGDs showing esophageal ulcer and esophageal stenosis: 09/2018, 10/2018, 04/2020, 06/2020, 02/2021, 05/2021, 09/2021. ---- Colonoscopy 07/2019: Fair prep, moderate diverticulosis in sigmoid and descending colon.  A 7 mm tubular adenoma in cecum, clips placed.   -- 02/2021: Benign-appearing esophageal stenosis, congested erythematous ulcerated mucosa in the esophagus, few mucosal papules found in the stomach, acute gastritis   -- 8/22: Esophageal ulcer with no bleeding, benign-appearing esophageal stenosis, lesion not amenable to dilation so was not attempted.  3 hyperplastic gastric polyps, repeat EGD in 3 months  -- 09/2021 for melena/dysphagia: esophageal ulcer, esophageal stenosis, medium sized hiatal hernia, no specimens collected. repeat in 2 months.  -- 12/2021 for dysphagia and melena: Esophageal ulcers with no bleeding, benign-appearing esophageal stenosis-dilated, esophageal mucosal changes suggestive of short segment Barrett's, normal stomach, no specimens collected  -- 03/20/2022 for therapy of  esophageal stricture: LA grade D reflux esophagitis with no bleeding, benign-appearing esophageal stenosis, small hiatal hernia, no specimens collected (repeat EGD in 2 months for treatment)     Past Medical History:  Diagnosis Date   Anemia    Atrial fibrillation (HCC)    Chronic combined systolic and diastolic CHF (congestive heart failure) ([place])    a. LV dysfcuntion felt to be related to PVCs   Chronic kidney disease, stage 4 (severe) (HCC)    Diabetes mellitus without complication (HCC)    GERD (gastroesophageal reflux disease)    Hx of echocardiogram    Echo (1/16):  EF 60-65%, no RWMA, Gr 1 DD, mod AI, mod MR, mild LAE   Hyperkalemia    Hyperlipidemia    Hypertension    NICM (nonischemic cardiomyopathy) ([place])    a. 12/2018 Echo: EF 35%, diff HK w/ septal-lateral dyssynchrony. Nl RV fxn; b. 07/2019 s/p MDT [id number] [name] CRT-P MRI SureScan device (ser #: [id number])   Orthostatic hypotension    PVC (premature ventricular contraction)    a. s/p PVC ablation in 11/2016   Rheumatic fever    Wears dentures     Past Surgical History:  Procedure Laterality Date   BALLOON DILATION N/A 07/12/2020   Procedure: BALLOON DILATION;  Surgeon: Ronald Lobo, MD;  Location: [facility];  Service: Endoscopy;  Laterality: N/A;   BALLOON DILATION N/A 03/20/2022   Procedure: BALLOON DILATION;  Surgeon: Otis Brace, MD;  Location: [facility];  Service: Gastroenterology;  Laterality: N/A;   BIOPSY  08/20/2019   Procedure: BIOPSY;  Surgeon: Otis Brace, MD;  Location: [place] [facility];  Service: Gastroenterology;;   BIV PACEMAKER INSERTION CRT-P N/A 07/24/2019   Procedure: BIV PACEMAKER INSERTION CRT-P;  Surgeon: Constance Haw, MD;  Location: [place] [facility];  Service: Cardiovascular;  Laterality: N/A;  COLONOSCOPY WITH PROPOFOL N/A 08/22/2019   Procedure: COLONOSCOPY WITH PROPOFOL;  Surgeon: Laurence Spates, MD;  Location: [place];  Service: Endoscopy;  Laterality:  N/A;   ESOPHAGOGASTRODUODENOSCOPY N/A 02/28/2021   Procedure: ESOPHAGOGASTRODUODENOSCOPY (EGD);  Surgeon: Wilford Corner, MD;  Location: [place];  Service: Endoscopy;  Laterality: N/A;   ESOPHAGOGASTRODUODENOSCOPY (EGD) WITH PROPOFOL N/A 09/25/2018   Procedure: ESOPHAGOGASTRODUODENOSCOPY (EGD) WITH PROPOFOL;  Surgeon: Ronald Lobo, MD;  [facility];  Service: Endoscopy;  Laterality: N/A;   ESOPHAGOGASTRODUODENOSCOPY (EGD) WITH PROPOFOL N/A 10/29/2018   Procedure: ESOPHAGOGASTRODUODENOSCOPY (EGD) WITH PROPOFOL;  Surgeon: Ronald Lobo, MD;  [facility];  Service: Endoscopy;  Laterality: N/A;   ESOPHAGOGASTRODUODENOSCOPY (EGD) WITH PROPOFOL N/A 08/20/2019   Procedure: ESOPHAGOGASTRODUODENOSCOPY (EGD) WITH PROPOFOL;  Surgeon: Otis Brace, MD;  Location: [place];  Service: Gastroenterology;  Laterality: N/A;   ESOPHAGOGASTRODUODENOSCOPY (EGD) WITH PROPOFOL N/A 05/03/2020   Procedure: ESOPHAGOGASTRODUODENOSCOPY (EGD) WITH PROPOFOL;  Surgeon: Ronald Lobo, MD;  [facility];  Service: Endoscopy;  Laterality: N/A;   ESOPHAGOGASTRODUODENOSCOPY (EGD) WITH PROPOFOL N/A 06/02/2020   Procedure: ESOPHAGOGASTRODUODENOSCOPY (EGD) WITH PROPOFOL and Savory Dilatation;  Surgeon: Clarene Essex, MD;  [facility];  Service: Gastroenterology;  Laterality: N/A;   ESOPHAGOGASTRODUODENOSCOPY (EGD) WITH PROPOFOL N/A 07/12/2020   Procedure: ESOPHAGOGASTRODUODENOSCOPY (EGD) WITH PROPOFOL with dilatation;  Surgeon: Ronald Lobo, MD;  [facility];  Service: Endoscopy;  Laterality: N/A;   ESOPHAGOGASTRODUODENOSCOPY (EGD) WITH PROPOFOL N/A 05/31/2021   Procedure: ESOPHAGOGASTRODUODENOSCOPY (EGD) WITH PROPOFOL;  Surgeon: Otis Brace, MD;  [facility];  Service: Gastroenterology;  Laterality: N/A;   ESOPHAGOGASTRODUODENOSCOPY (EGD) WITH PROPOFOL N/A 10/10/2021   Procedure: ESOPHAGOGASTRODUODENOSCOPY (EGD) WITH PROPOFOL;  Surgeon: Otis Brace, MD;  [facility];  Service: Gastroenterology;  Laterality: N/A;   ESOPHAGOGASTRODUODENOSCOPY (EGD) WITH PROPOFOL N/A 12/28/2021   Procedure: ESOPHAGOGASTRODUODENOSCOPY (EGD) WITH PROPOFOL;  Surgeon: [name], MD;  Location: [place];  Service: Gastroenterology;  Laterality: N/A;   ESOPHAGOGASTRODUODENOSCOPY (EGD) WITH PROPOFOL N/A 03/20/2022   Procedure: ESOPHAGOGASTRODUODENOSCOPY (EGD) WITH PROPOFOL;  Surgeon: Otis Brace, MD;  [facility];  Service: Gastroenterology;  Laterality: N/A;   HEMOSTASIS CLIP PLACEMENT  08/22/2019   Procedure: HEMOSTASIS CLIP PLACEMENT;  Surgeon: Laurence Spates, MD;  [facility];  Service: Endoscopy;;   KNEE SURGERY Left 2009   OVARIAN CYST REMOVAL     POLYPECTOMY  08/22/2019   Procedure: POLYPECTOMY;  Surgeon: Laurence Spates, MD;  Location: [place];  Service: Endoscopy;;   POLYPECTOMY  05/31/2021   Procedure: POLYPECTOMY;  Surgeon: Otis Brace, MD;  [facility];  Service: Gastroenterology;;   PVC ABLATION N/A 11/30/2016   Procedure: PVC Ablation;  Surgeon: Will [name], MD;  Location: [place] [facility];  Service: Cardiovascular;  Laterality: N/A;   RIGHT HEART CATH N/A 01/31/2018   Procedure: RIGHT HEART CATH;  Surgeon: Jolaine Artist, MD;  Location: [place] [facility];  Service: Cardiovascular;  Laterality: N/A;   RIGHT/LEFT HEART CATH AND CORONARY ANGIOGRAPHY N/A 01/25/2017   Procedure: Right/Left Heart Cath and Coronary Angiography;  Surgeon: Jolaine Artist, MD;  Location: [place] [facility];  Service: Cardiovascular;  Laterality: N/A;   SAVORY DILATION N/A 09/25/2018   Procedure: SAVORY DILATION;  Surgeon: Ronald Lobo, MD;  [facility];  Service: Endoscopy;  Laterality: N/A;   SAVORY DILATION N/A 10/29/2018   Procedure: SAVORY DILATION;  Surgeon: Ronald Lobo, MD;  [facility];  Service: Endoscopy;  Laterality: N/A;   SAVORY DILATION N/A 08/20/2019    Procedure: SAVORY DILATION;  Surgeon: Otis Brace, MD;  [facility];  Service: Gastroenterology;  Laterality: N/A;   SAVORY DILATION N/A 05/03/2020   Procedure: SAVORY DILATION;  Surgeon: Ronald Lobo, MD;  [facility];  Service: Endoscopy;  Laterality: N/A;  Patient needs ultraslim pediatric upper endoscope/     no fluoro per dr. Cristal Deer DILATION N/A 06/02/2020   Procedure: SAVORY DILATION w/ Burtis Junes;  Surgeon: Clarene Essex, MD;  [facility];  Service: Gastroenterology;  Laterality: N/A;    Prior to Admission medications   Medication Sig Start Date End Date Taking? Authorizing Provider  acetaminophen (TYLENOL) 500 MG tablet Take 500 mg by mouth at bedtime.    [provider]  amiodarone (PACERONE) 100 MG tablet TAKE ONE TABLET BY MOUTH EVERY OTHER DAY Patient taking differently: Take 100 mg by mouth 3 (three) times a week. 01/18/22   Camnitz, Ocie Doyne, MD  apixaban (ELIQUIS) 2.5 MG TABS tablet Take 1 tablet (2.5 mg total) by mouth 2 (two) times daily. 12/31/21   Geradine Girt, DO  atorvastatin (LIPITOR) 20 MG tablet Take 20 mg by mouth every other day.    [provider]  carvedilol (COREG) 12.5 MG tablet TAKE ONE TABLET BY MOUTH TWICE DAILY WITH A MEAL 01/18/22   Bensimhon, Shaune Pascal, MD  cholecalciferol (VITAMIN D3) 25 MCG (1000 UNIT) tablet Take 1,000 Units by mouth every morning.    [provider]  Cyanocobalamin (VITAMIN B-12) 2500 MCG SUBL Place 2,500 mcg under the tongue 3 (three) times a week.    [provider]  diphenhydrAMINE (BENADRYL) 25 mg capsule Take 25 mg by mouth every 6 (six) hours as needed (allergies/sleep).    [provider]  Ensure (ENSURE) Take 237 mLs by mouth daily.    [provider]  hydrALAZINE (APRESOLINE) 50 MG tablet TAKE ONE TABLET BY MOUTH TWICE DAILY Patient taking differently: Take 50 mg by mouth 2 (two) times daily. 10/20/21   Nahser, Wonda Cheng, MD   lidocaine (LIDODERM) 5 % Place 2 patches onto the skin daily. Remove & Discard patch within 12 hours or as directed by MD Patient taking differently: Place 1 patch onto the skin daily as needed (pain). Remove & Discard patch within 12 hours or as directed by MD 12/29/21   Geradine Girt, DO  pantoprazole (PROTONIX) 40 MG tablet Take 40 mg by mouth in the morning and at bedtime.    [provider]  Polyethyl Glycol-Propyl Glycol 0.4-0.3 % SOLN Place 1 drop into both eyes every 8 (eight) hours as needed (dryness).    [provider]  polyethylene glycol (MIRALAX / GLYCOLAX) 17 g packet Take 17 g by mouth daily as needed (constipation.).    [provider]  sucralfate (CARAFATE) 1 GM/10ML suspension Take 10 mLs (1 g total) by mouth 4 (four) times daily. 12/28/21 03/28/22  Geradine Girt, DO  torsemide (DEMADEX) 20 MG tablet Take 2 tablets (40 mg total) by mouth every morning for 3 days, THEN 1 tablet (20 mg total) every evening for 3 days, THEN 2 tablets (40 mg total) daily. 12/13/21 03/20/22  Rafael Bihari, FNP    Scheduled Meds: Continuous Infusions: PRN Meds:.  Allergies as of 04/12/2022 - Review Complete 04/12/2022  Allergen Reaction Noted   Epoetin alfa Nausea Only and Other (See Comments) 12/08/2019   Penicillins Itching and Rash 12/15/2012   Retacrit [epoetin (alfa)] Other (See Comments) 12/08/2019   Crestor [rosuvastatin] Nausea Only 02/22/2021   Hydrocodone Itching  and Other (See Comments) 02/22/2021   Influenza vac split quad Swelling and Other (See Comments) 02/22/2021   Tape Other (See Comments) 02/27/2021   Cefaclor Itching 02/28/2012   Crestor [rosuvastatin calcium] Nausea Only 10/23/2016   Influenza vaccines Swelling and Other (See Comments) 09/17/2018   Lidocaine hcl Palpitations and Other (See Comments) 10/23/2016   Nsaids Other (See Comments) 12/15/2012   Percocet [oxycodone-acetaminophen] Nausea And Vomiting 02/28/2012   Prednisone Other (See  Comments) 12/15/2012    Family History  Problem Relation Age of Onset   Heart attack Father    Heart disease Father    Cancer Father    Hypertension Father    Hypertension Brother    Stroke Brother    Hypertension Brother    Diabetes Son    Hyperlipidemia Son    Hypertension Son     Social History   Socioeconomic History   Marital status: Widowed    Spouse name: Not on file   Number of children: Not on file   Years of education: Not on file   Highest education level: Not on file  Occupational History   Not on file  Tobacco Use   Smoking status: Former    Packs/day: 0.75    Years: 60.00    Total pack years: 45.00    Types: Cigarettes    Quit date: 11/04/2016    Years since quitting: 5.4   Smokeless tobacco: Never  Vaping Use   Vaping Use: Never used  Substance and Sexual Activity   Alcohol use: No   Drug use: No   Sexual activity: Not on file  Other Topics Concern   Not on file  Social History Narrative   Lives at [facility].   Social Determinants of Health   Financial Resource Strain: Not on file  Food Insecurity: Not on file  Transportation Needs: Not on file  Physical Activity: Not on file  Stress: Not on file  Social Connections: Not on file  Intimate Partner Violence: Not on file    Review of Systems: All negative except as stated above in HPI.  Physical Exam: Vital signs: Vitals:   04/12/22 1315 04/12/22 1413  BP: (!) 177/62 (!) 150/62  Pulse: 77 70  Resp: 17 13  Temp:    SpO2: 99% 100%        GI:  Lab Results: Recent Labs    04/12/22 0947  WBC 5.8  HGB 9.0*  HCT 28.2*  PLT 224   BMET Recent Labs    04/12/22 0947  NA 130*  K 3.9  CL 92*  CO2 27  GLUCOSE 421*  BUN 53*  CREATININE 2.17*  CALCIUM 9.8   LFT Recent Labs    04/12/22 0947  PROT 7.0  ALBUMIN 3.8  AST 14*  ALT 12  ALKPHOS 75  BILITOT 0.5   PT/INR No results for input(s): "LABPROT", "INR" in the last 72  hours.   Studies/Results: DG Chest Portable 1 View  Result Date: 04/12/2022 CLINICAL DATA:  Dyspnea on exertion EXAM: PORTABLE CHEST 1 VIEW COMPARISON:  10/03/2021 chest radiograph. FINDINGS: Stable configuration of 3 lead left subclavian pacemaker. Stable cardiomediastinal silhouette with mild cardiomegaly. No pneumothorax. No pleural effusion. Stable calcified left suprahilar granuloma. No pulmonary edema. No acute consolidative airspace disease. IMPRESSION: Mild cardiomegaly without pulmonary edema. No active pulmonary disease. Electronically Signed   By: Ilona Sorrel M.D.   On: 04/12/2022 10:09    Impression: Symptomatic anemia, acute on chronic ---  EGD 12/2021 for  dysphagia and melena: Esophageal ulcers with no bleeding, benign-appearing esophageal stenosis-dilated, esophageal mucosal changes suggestive of short segment Barrett's, normal stomach, no specimens collected -- EGD 03/20/2022 for therapy of esophageal stricture: LA grade D reflux esophagitis with no bleeding, benign-appearing esophageal stenosis, small hiatal hernia, no specimens collected (repeat EGD in 2 months for treatment)  - hgb 9.0 (baseline anemia 9-10)   Plan:   LOS: 0 days   Madesyn Ast Radford Pax  PA-C 04/12/2022, 2:39 PM  Contact #  [phone]

## 2022-04-12 NOTE — ED Notes (Signed)
Called [facility] spoke with Ander Purpura will call GI dr. After 10 minutes will call hospitalist

## 2022-04-12 NOTE — Assessment & Plan Note (Addendum)
Due to GIB. 1. H/h now and repeat CBC in AM 2. Type and screen 3. Tele monitor 4. Pt asking about venofer infusion 1. Will check iron level

## 2022-04-12 NOTE — Progress Notes (Signed)
Pt arrived to room 5M19 via [facility] from [place]. Received report from [place], [place]. Pt alert and oriented X4. Call bell within reach.

## 2022-04-12 NOTE — Assessment & Plan Note (Addendum)
CXR neg for pulm edema, doesn't appear to be in acute fluid overload. 1. Hold torsemide for the moment in setting of UGIB 2. Cont coreg and hydralazine

## 2022-04-12 NOTE — H&P (Signed)
History and Physical    Patient: Krista Mason NGE:[id number] DOB: Nov 14, 1935 DOA: 04/12/2022 DOS: the patient was seen and examined on 04/12/2022 PCP: [facility] [place]  Patient coming from: Home  Chief Complaint:  Chief Complaint  Patient presents with   Shortness of Breath   HPI: Krista Mason is a 86 y.o. female with medical history significant of anemia; afib on Eliquis; chronic combined CHF; DM; HTN; and HLD presenting with symptomatic anemia (sent by infusion clinic).  She has a h/o esophagitis/ulcer and esophageal stricture with recurrent anemia.  She was last admitted from 3/7-10 with esophageal ulcer, treated with PPI and Carafate.  Also had esophageal stricture that was dilated.  Repeat EGD was performed on 5/30 - which showed grade D esophagitis and benign-appearing intrinsic severe stenosis in the distal esophagus.  Pt presents to ED today with c/o black stools all week, feeling tired, SOB.    Work up in ED showed that she had dropped Hgb 2 grams in past 2 weeks.  She is profoundly SOB with any movement, improves with rest.  Per EDP: She does not look fluid overloaded.  Given Protonix.  They spoke with GI who do not plan to re-scope but will consult.      Review of Systems: As mentioned in the history of present illness. All other systems reviewed and are negative. Past Medical History:  Diagnosis Date   Anemia    Atrial fibrillation (HCC)    Chronic combined systolic and diastolic CHF (congestive heart failure) (HCC)    a. LV dysfcuntion felt to be related to PVCs   Chronic kidney disease, stage 4 (severe) (HCC)    Diabetes mellitus without complication (HCC)    GERD (gastroesophageal reflux disease)    Hx of echocardiogram    Echo (1/16):  EF 60-65%, no RWMA, Gr 1 DD, mod AI, mod MR, mild LAE   Hyperkalemia    Hyperlipidemia    Hypertension    NICM (nonischemic cardiomyopathy) (HCC)    a. 12/2018 Echo: EF 35%, diff HK w/ septal-lateral  dyssynchrony. Nl RV fxn; b. 07/2019 s/p MDT [id number] Paulene Floor CRT-P MRI SureScan device (ser #: [id number])   Orthostatic hypotension    PVC (premature ventricular contraction)    a. s/p PVC ablation in 11/2016   Rheumatic fever    Wears dentures    Past Surgical History:  Procedure Laterality Date   BALLOON DILATION N/A 07/12/2020   Procedure: BALLOON DILATION;  Surgeon: Bernette Redbird, MD;  Location: [facility];  Service: Endoscopy;  Laterality: N/A;   BALLOON DILATION N/A 03/20/2022   Procedure: BALLOON DILATION;  Surgeon: Kathi Der, MD;  Location: [facility];  Service: Gastroenterology;  Laterality: N/A;   BIOPSY  08/20/2019   Procedure: BIOPSY;  Surgeon: Kathi Der, MD;  Location: [facility];  Service: Gastroenterology;;   BIV PACEMAKER INSERTION CRT-P N/A 07/24/2019   Procedure: BIV PACEMAKER INSERTION CRT-P;  Surgeon: Regan Lemming, MD;  Location: [facility];  Service: Cardiovascular;  Laterality: N/A;   COLONOSCOPY WITH PROPOFOL N/A 08/22/2019   Procedure: COLONOSCOPY WITH PROPOFOL;  Surgeon: Carman Ching, MD;  Location: [facility] ENDOSCOPY;  Service: Endoscopy;  Laterality: N/A;   ESOPHAGOGASTRODUODENOSCOPY N/A 02/28/2021   Procedure: ESOPHAGOGASTRODUODENOSCOPY (EGD);  Surgeon: Charlott Rakes, MD;  Location[facility]OSCOPY;  Service: Endoscopy;  Laterality: N/A;   ESOPHAGOGASTRODUODENOSCOPY (EGD) WITH PROPOFOL N/A 09/25/2018   Procedure: ESOPHAGOGASTRODUODENOSCOPY (EGD) WITH PROPOFOL;  Surgeon: Bernette Redbird, MD;  Location: [facility];  Service: Endoscopy;  Laterality: N/A;   ESOPHAGOGASTRODUODENOSCOPY (EGD) WITH PROPOFOL N/A 10/29/2018   Procedure: ESOPHAGOGASTRODUODENOSCOPY (EGD) WITH PROPOFOL;  Surgeon: Bernette Redbird, MD;  Location: [facility];  Service: Endoscopy;  Laterality: N/A;   ESOPHAGOGASTRODUODENOSCOPY (EGD) WITH PROPOFOL N/A 08/20/2019   Procedure: ESOPHAGOGASTRODUODENOSCOPY (EGD) WITH PROPOFOL;  Surgeon: Kathi Der, MD;   Location: [facility];  Service: Gastroenterology;  Laterality: N/A;   ESOPHAGOGASTRODUODENOSCOPY (EGD) WITH PROPOFOL N/A 05/03/2020   Procedure: ESOPHAGOGASTRODUODENOSCOPY (EGD) WITH PROPOFOL;  Surgeon: Bernette Redbird, MD;  Location: [facility];  Service: Endoscopy;  Laterality: N/A;   ESOPHAGOGASTRODUODENOSCOPY (EGD) WITH PROPOFOL N/A 06/02/2020   Procedure: ESOPHAGOGASTRODUODENOSCOPY (EGD) WITH PROPOFOL and Savory Dilatation;  Surgeon: Vida Rigger, MD;  Location: [facility];  Service: Gastroenterology;  Laterality: N/A;   ESOPHAGOGASTRODUODENOSCOPY (EGD) WITH PROPOFOL N/A 07/12/2020   Procedure: ESOPHAGOGASTRODUODENOSCOPY (EGD) WITH PROPOFOL with dilatation;  Surgeon: Bernette Redbird, MD;  Location: [facility];  Service: Endoscopy;  Laterality: N/A;   ESOPHAGOGASTRODUODENOSCOPY (EGD) WITH PROPOFOL N/A 05/31/2021   Procedure: ESOPHAGOGASTRODUODENOSCOPY (EGD) WITH PROPOFOL;  Surgeon: Kathi Der, MD;  Location: [facility];  Service: Gastroenterology;  Laterality: N/A;   ESOPHAGOGASTRODUODENOSCOPY (EGD) WITH PROPOFOL N/A 10/10/2021   Procedure: ESOPHAGOGASTRODUODENOSCOPY (EGD) WITH PROPOFOL;  Surgeon: Kathi Der, MD;  Location: [facility];  Service: Gastroenterology;  Laterality: N/A;   ESOPHAGOGASTRODUODENOSCOPY (EGD) WITH PROPOFOL N/A 12/28/2021   Procedure: ESOPHAGOGASTRODUODENOSCOPY (EGD) WITH PROPOFOL;  Surgeon: Kerin Salen, MD;  Location: [facility] ENDOSCOPY;  Service: Gastroenterology;  Laterality: N/A;   ESOPHAGOGASTRODUODENOSCOPY (EGD) WITH PROPOFOL N/A 03/20/2022   Procedure: ESOPHAGOGASTRODUODENOSCOPY (EGD) WITH PROPOFOL;  Surgeon: Kathi Der, MD;  Location: [facility];  Service: Gastroenterology;  Laterality: N/A;   HEMOSTASIS CLIP PLACEMENT  08/22/2019   Procedure: HEMOSTASIS CLIP PLACEMENT;  Surgeon: Carman Ching, MD;  Location: [facility] ENDOSCOPY;  Service: Endoscopy;;   KNEE SURGERY Left 2009   OVARIAN CYST REMOVAL     POLYPECTOMY  08/22/2019   Procedure:  POLYPECTOMY;  Surgeon: Carman Ching, MD;  Location: [facility] ENDOSCOPY;  Service: Endoscopy;;   POLYPECTOMY  05/31/2021   Procedure: POLYPECTOMY;  Surgeon: Kathi Der, MD;  Location: [facility];  Service: Gastroenterology;;   PVC ABLATION N/A 11/30/2016   Procedure: PVC Ablation;  Surgeon: Will Jorja Loa, MD;  [facility];  Service: Cardiovascular;  Laterality: N/A;   RIGHT HEART CATH N/A 01/31/2018   Procedure: RIGHT HEART CATH;  Surgeon: Dolores Patty, MD;  [facility]: [facility];  Service: Cardiovascular;  Laterality: N/A;   RIGHT/LEFT HEART CATH AND CORONARY ANGIOGRAPHY N/A 01/25/2017   Procedure: Right/Left Heart Cath and Coronary Angiography;  Surgeon: Dolores Patty, MD;  Location: [facility] INVASIVE CV LAB;  Service: Cardiovascular;  Laterality: N/A;   SAVORY DILATION N/A 09/25/2018   Procedure: SAVORY DILATION;  Surgeon: Bernette Redbird, MD;  Location: [facility];  Service: Endoscopy;  Laterality: N/A;   SAVORY DILATION N/A 10/29/2018   Procedure: SAVORY DILATION;  Surgeon: Bernette Redbird, MD;  Location: [facility];  Service: Endoscopy;  Laterality: N/A;   SAVORY DILATION N/A 08/20/2019   Procedure: SAVORY DILATION;  Surgeon: Kathi Der, MD;  Location: [facility];  Service: Gastroenterology;  Laterality: N/A;   SAVORY DILATION N/A 05/03/2020   Procedure: SAVORY DILATION;  Surgeon: Bernette Redbird, MD;  Location: [facility];  Service: Endoscopy;  Laterality: N/A;  Patient needs ultraslim pediatric upper endoscope/     no fluoro per dr. Dennie Maizes DILATION N/A 06/02/2020   Procedure: SAVORY DILATION w/ Anne Shutter;  Surgeon: Vida Rigger, MD;  Location: [facility];  Service:  Gastroenterology;  Laterality: N/A;   Social History:  reports that she quit smoking about 5 years ago. Her smoking use included cigarettes. She has a 45.00 pack-year smoking history. She has never used smokeless tobacco. She reports that she does not drink alcohol and does  not use drugs.  Allergies  Allergen Reactions   Epoetin Alfa Nausea Only and Other (See Comments)    Procrit and Epogen: "Chills, low grade fever, body aches, fatigue, nausea for 3 days"    Penicillins Itching and Rash    Amoxicillin ok- IM pen is what gives reaction  Did it involve swelling of the face/tongue/throat, SOB, or low BP? No Did it involve sudden or severe rash/hives, skin peeling, or any reaction on the inside of your mouth or nose? No Did you need to seek medical attention at a hospital or doctor's office? No When did it last happen?      10 + year If all above answers are "NO", may proceed with cephalosporin use.  Other reaction(s): rash, itching   Retacrit [Epoetin (Alfa)] Other (See Comments)    Chills, low grade fever, body aches, fatigue, nausea for 3 days.   Crestor [Rosuvastatin] Nausea Only   Hydrocodone Itching and Other (See Comments)    And the patient "got mean"   Influenza Vac Split Quad Swelling and Other (See Comments)    Local swelling and fever   Tape Other (See Comments)    The skin tears easily   Cefaclor Itching   Crestor [Rosuvastatin Calcium] Nausea Only   Influenza Vaccines Swelling and Other (See Comments)    Swelling and redness at injection site, fever   Lidocaine Hcl Palpitations and Other (See Comments)    Heart racing if Lidocaine is with EPI   Nsaids Other (See Comments)    Pt states she is not taking because of kidney function    Percocet [Oxycodone-Acetaminophen] Nausea And Vomiting   Prednisone Other (See Comments)    Dizziness, spiked blood sugar     Family History  Problem Relation Age of Onset   Heart attack Father    Heart disease Father    Cancer Father    Hypertension Father    Hypertension Brother    Stroke Brother    Hypertension Brother    Diabetes Son    Hyperlipidemia Son    Hypertension Son     Prior to Admission medications   Medication Sig Start Date End Date Taking? Authorizing Provider   acetaminophen (TYLENOL) 500 MG tablet Take 500 mg by mouth at bedtime.   Yes [provider]  amiodarone (PACERONE) 100 MG tablet TAKE ONE TABLET BY MOUTH EVERY OTHER DAY Patient taking differently: Take 100 mg by mouth 3 (three) times a week. 01/18/22  Yes Camnitz, Andree Coss, MD  apixaban (ELIQUIS) 2.5 MG TABS tablet Take 1 tablet (2.5 mg total) by mouth 2 (two) times daily. 12/31/21  Yes Vann, Jessica U, DO  atorvastatin (LIPITOR) 20 MG tablet Take 20 mg by mouth every other day.   Yes [provider]  carvedilol (COREG) 12.5 MG tablet TAKE ONE TABLET BY MOUTH TWICE DAILY WITH A MEAL Patient taking differently: Take 12.5 mg by mouth 2 (two) times daily with a meal. 01/18/22  Yes Bensimhon, Bevelyn Buckles, MD  cholecalciferol (VITAMIN D3) 25 MCG (1000 UNIT) tablet Take 1,000 Units by mouth every morning.   Yes [provider]  Cyanocobalamin (VITAMIN B-12) 2500 MCG SUBL Place 2,500 mcg under the tongue every other day.  Yes [provider]  diphenhydrAMINE (BENADRYL) 25 mg capsule Take 25 mg by mouth every 6 (six) hours as needed (allergies/sleep).   Yes [provider]  Ensure (ENSURE) Take 237 mLs by mouth daily.   Yes [provider]  hydrALAZINE (APRESOLINE) 50 MG tablet TAKE ONE TABLET BY MOUTH TWICE DAILY Patient taking differently: Take 50 mg by mouth 2 (two) times daily. 10/20/21  Yes Nahser, Deloris Ping, MD  lidocaine (LIDODERM) 5 % Place 2 patches onto the skin daily. Remove & Discard patch within 12 hours or as directed by MD Patient taking differently: Place 1 patch onto the skin daily as needed (pain). 12/29/21  Yes Vann, Jessica U, DO  pantoprazole (PROTONIX) 40 MG tablet Take 40 mg by mouth in the morning and at bedtime.   Yes [provider]  Polyethyl Glycol-Propyl Glycol 0.4-0.3 % SOLN Place 1 drop into both eyes every 8 (eight) hours as needed (dryness).   Yes [provider]  polyethylene glycol (MIRALAX / GLYCOLAX)  17 g packet Take 17 g by mouth daily as needed for mild constipation (constipation.).   Yes [provider]  sucralfate (CARAFATE) 1 GM/10ML suspension Take 10 mLs (1 g total) by mouth 4 (four) times daily. Patient taking differently: Take 1 g by mouth 2 (two) times daily. 12/28/21 04/12/22 Yes Vann, Jessica U, DO  torsemide (DEMADEX) 20 MG tablet Take 2 tablets (40 mg total) by mouth every morning for 3 days, THEN 1 tablet (20 mg total) every evening for 3 days, THEN 2 tablets (40 mg total) daily. 12/13/21 04/12/22 Yes Jacklynn Ganong, FNP    Physical Exam: Vitals:   04/12/22 1545 04/12/22 1615 04/12/22 1715 04/12/22 1845  BP: (!) 122/57 (!) 132/53 (!) 142/66 (!) 155/61  Pulse: 81 76 71 66  Resp: 18 16 16 18   Temp:    (!) 97.5 F (36.4 C)  TempSrc:    Oral  SpO2: 100% 100% 100% 100%  Weight:      Height:       Constitutional: NAD, calm, comfortable Eyes: PERRL, lids and conjunctivae normal ENMT: Mucous membranes are moist. Posterior pharynx clear of any exudate or lesions.Normal dentition.  Neck: normal, supple, no masses, no thyromegaly Respiratory: clear to auscultation bilaterally, no wheezing, no crackles. Normal respiratory effort. No accessory muscle use.  Cardiovascular: Regular rate and rhythm, no murmurs / rubs / gallops. No extremity edema. 2+ pedal pulses. No carotid bruits.  Abdomen: no tenderness, no masses palpated. No hepatosplenomegaly. Bowel sounds positive.  Musculoskeletal: no clubbing / cyanosis. No joint deformity upper and lower extremities. Good ROM, no contractures. Normal muscle tone.  Skin: no rashes, lesions, ulcers. No induration Neurologic: CN 2-12 grossly intact. Sensation intact, DTR normal. Strength 5/5 in all 4.  Psychiatric: Normal judgment and insight. Alert and oriented x 3. Normal mood.   Data Reviewed:       Latest Ref Rng & Units 04/12/2022    9:47 AM 03/27/2022    9:47 AM 02/20/2022    9:58 AM  CBC  WBC 4.0 - 10.5 K/uL 5.8      Hemoglobin 12.0 - 15.0 g/dL 9.0  95.6  9.0   Hematocrit 36.0 - 46.0 % 28.2     Platelets 150 - 400 K/uL 224         Latest Ref Rng & Units 04/12/2022    9:47 AM 12/28/2021    5:56 AM 12/27/2021    4:24 AM  BMP  Glucose 70 - 99  mg/dL 578  469  629   BUN 8 - 23 mg/dL 53  30  40   Creatinine 0.44 - 1.00 mg/dL 5.28  4.13  2.44   Sodium 135 - 145 mmol/L 130  136  132   Potassium 3.5 - 5.1 mmol/L 3.9  3.5  3.4   Chloride 98 - 111 mmol/L 92  102  100   CO2 22 - 32 mmol/L 27  25  23    Calcium 8.9 - 10.3 mg/dL 9.8  9.0  8.8      Assessment and Plan: * Acute upper GI bleed Recurrent UGIB, likely due to esophagitis as demonstrated on EGD 5/30 in setting of eliquis use. Stop eliquis Protonix bolus and gtt Carafate QID [facility] to consult, contacted by EDP -> doesn't sound like they are planning re-scope at this time though. Clear liquid diet for now  Symptomatic anemia Due to GIB. H/h now and repeat CBC in AM Type and screen Tele monitor Pt asking about venofer infusion Will check iron level  Paroxysmal atrial fibrillation (HCC) Stop eliquis Cont amiodarone Cont Coreg  Chronic combined systolic (congestive) and diastolic (congestive) heart failure (HCC) CXR neg for pulm edema, doesn't appear to be in acute fluid overload. Hold torsemide for the moment in setting of UGIB Cont coreg and hydralazine  CKD (chronic kidney disease) stage 4, GFR 15-29 ml/min (HCC) Chronic and baseline. Repeat BMP in AM.      Advance Care Planning:   Code Status: DNR  Consults: EDP spoke with [facility]  Family Communication: No family in room  Severity of Illness: The appropriate patient status for this patient is INPATIENT. Inpatient status is judged to be reasonable and necessary in order to provide the required intensity of service to ensure the patient's safety. The patient's presenting symptoms, physical exam findings, and initial radiographic and laboratory data in the context of their  chronic comorbidities is felt to place them at high risk for further clinical deterioration. Furthermore, it is not anticipated that the patient will be medically stable for discharge from the hospital within 2 midnights of admission.   * I certify that at the point of admission it is my clinical judgment that the patient will require inpatient hospital care spanning beyond 2 midnights from the point of admission due to high intensity of service, high risk for further deterioration and high frequency of surveillance required.*  Author: Hillary Bow., DO 04/12/2022 7:46 PM  For on call review [email or web address].

## 2022-04-13 DIAGNOSIS — K922 Gastrointestinal hemorrhage, unspecified: Secondary | ICD-10-CM | POA: Diagnosis not present

## 2022-04-13 LAB — BASIC METABOLIC PANEL
Anion gap: 8 (ref 5–15)
BUN: 40 mg/dL — ABNORMAL HIGH (ref 8–23)
CO2: 28 mmol/L (ref 22–32)
Calcium: 9.2 mg/dL (ref 8.9–10.3)
Chloride: 99 mmol/L (ref 98–111)
Creatinine, Ser: 1.95 mg/dL — ABNORMAL HIGH (ref 0.44–1.00)
GFR, Estimated: 25 mL/min — ABNORMAL LOW (ref >60)
Glucose, Bld: 216 mg/dL — ABNORMAL HIGH (ref 70–99)
Potassium: 3.7 mmol/L (ref 3.5–5.1)
Sodium: 135 mmol/L (ref 135–145)

## 2022-04-13 LAB — GLUCOSE, CAPILLARY
Glucose-Capillary: 263 mg/dL — ABNORMAL HIGH (ref 70–99)
Glucose-Capillary: 158 mg/dL — ABNORMAL HIGH (ref 70–99)

## 2022-04-13 LAB — CBC
HCT: 26.3 % — ABNORMAL LOW (ref 36.0–46.0)
Hemoglobin: 8.4 g/dL — ABNORMAL LOW (ref 12.0–15.0)
MCH: 30.3 pg (ref 26.0–34.0)
MCHC: 31.9 g/dL (ref 30.0–36.0)
MCV: 94.9 fL (ref 80.0–100.0)
Platelets: 190 10*3/uL (ref 150–400)
RBC: 2.77 MIL/uL — ABNORMAL LOW (ref 3.87–5.11)
RDW: 18.8 % — ABNORMAL HIGH (ref 11.5–15.5)
WBC: 4.4 10*3/uL (ref 4.0–10.5)
nRBC: 0 % (ref 0.0–0.2)

## 2022-04-13 LAB — HEMOGLOBIN AND HEMATOCRIT, BLOOD
HCT: 27.4 % — ABNORMAL LOW (ref 36.0–46.0)
HCT: 26.6 % — ABNORMAL LOW (ref 36.0–46.0)
HCT: 25.4 % — ABNORMAL LOW (ref 36.0–46.0)
Hemoglobin: 8.6 g/dL — ABNORMAL LOW (ref 12.0–15.0)
Hemoglobin: 8.9 g/dL — ABNORMAL LOW (ref 12.0–15.0)
Hemoglobin: 8.1 g/dL — ABNORMAL LOW (ref 12.0–15.0)

## 2022-04-13 LAB — IRON AND TIBC
Iron: 21 ug/dL — ABNORMAL LOW (ref 28–170)
Saturation Ratios: 7 % — ABNORMAL LOW (ref 10.4–31.8)
TIBC: 314 ug/dL (ref 250–450)
UIBC: 293 ug/dL

## 2022-04-13 MED ORDER — SODIUM CHLORIDE 0.9 % IV BOLUS
500.0000 mL | Freq: Once | INTRAVENOUS | Status: AC
  Administered 2022-04-13: 500 mL via INTRAVENOUS

## 2022-04-13 MED ORDER — INSULIN ASPART 100 UNIT/ML IJ SOLN
0.0000 [IU] | Freq: Every day | INTRAMUSCULAR | Status: DC
  Administered 2022-04-14: 1 [IU] via SUBCUTANEOUS
  Administered 2022-04-15: 2 [IU] via SUBCUTANEOUS

## 2022-04-13 MED ORDER — SODIUM CHLORIDE 0.9 % IV SOLN
250.0000 mg | Freq: Every day | INTRAVENOUS | Status: AC
  Administered 2022-04-13 – 2022-04-14 (×2): 250 mg via INTRAVENOUS
  Filled 2022-04-13 (×2): qty 20

## 2022-04-13 MED ORDER — INSULIN ASPART 100 UNIT/ML IJ SOLN
0.0000 [IU] | Freq: Three times a day (TID) | INTRAMUSCULAR | Status: DC
  Administered 2022-04-13 – 2022-04-14 (×2): 5 [IU] via SUBCUTANEOUS
  Administered 2022-04-14: 1 [IU] via SUBCUTANEOUS
  Administered 2022-04-14 – 2022-04-15 (×2): 2 [IU] via SUBCUTANEOUS
  Administered 2022-04-15: 3 [IU] via SUBCUTANEOUS
  Administered 2022-04-15: 1 [IU] via SUBCUTANEOUS
  Administered 2022-04-16: 2 [IU] via SUBCUTANEOUS

## 2022-04-13 NOTE — Evaluation (Signed)
Physical Therapy Evaluation Patient Details Name: Krista Mason MRN: [id number] DOB: 1936/02/08 Today's Date: 04/13/2022  History of Present Illness  86 y.o. female  presenting to ED 6/22 with symptomatic anemia (sent by infusion clinic) c/o black stools all week, feeling tired, SOB. Admitted for treatment of Acute upper GI Bleed. NWG:NFAOZH; afib on Eliquis; COVID hospitalization chronic combined CHF; DM; HTN; and HLD  Clinical Impression  PTA pt living in Senior Living facility in apartment with level entry. Pt uses assistive device for independent ambulation and is independent in bathing and dressing, facility provides meals in dining room and housekeeping. Pt is currently limited in safe mobility by slight dizziness with positional change in presence of generalized weakness and decreased balance. Pt is min guard for bed mobility, and transfers. PT recommending HHPT at discharge (pt prefers [place]). PT will continue to follow acutely.     Recommendations for follow up therapy are one component of a multi-disciplinary discharge planning process, led by the attending physician.  Recommendations may be updated based on patient status, additional functional criteria and insurance authorization.  Follow Up Recommendations Home health PT (pt prefers [place])      Assistance Recommended at Discharge Intermittent Supervision/Assistance  Patient can return home with the following  Assistance with cooking/housework;Assist for transportation;Help with stairs or ramp for entrance;A little help with walking and/or transfers;A little help with bathing/dressing/bathroom    Equipment Recommendations None recommended by PT     Functional Status Assessment Patient has had a recent decline in their functional status and demonstrates the ability to make significant improvements in function in a reasonable and predictable amount of time.     Precautions / Restrictions Precautions Precautions:  Fall Restrictions Weight Bearing Restrictions: No      Mobility  Bed Mobility Overal bed mobility: Needs Assistance Bed Mobility: Supine to Sit     Supine to sit: Min guard, HOB elevated     General bed mobility comments: min guard for safety, increased time and effort to pull to EoB, slight dizziness when she arrives    Transfers Overall transfer level: Needs assistance Equipment used: Rolling walker (2 wheels) Transfers: Sit to/from Stand, Bed to chair/wheelchair/BSC Sit to Stand: Min guard   Step pivot transfers: Min guard       General transfer comment: min guard for power up and steadying, slight dizziness with standing but dissipates, min guard with stepping to recliner    Ambulation/Gait               General Gait Details: ambulation deferred due to dizziness with transfer, will refer to Mobility Team for daily mobilization        Balance Overall balance assessment: Mild deficits observed, not formally tested                                           Pertinent Vitals/Pain Pain Assessment Pain Assessment: No/denies pain    Home Living Family/patient expects to be discharged to:: Private residence Living Arrangements: Alone Available Help at Discharge: Friend(s);Available PRN/intermittently Type of Home: Apartment Home Access: Level entry       Home Layout: One level Home Equipment: Agricultural consultant (2 wheels);Rollator (4 wheels);Wheelchair - manual;Shower seat;Grab bars - toilet;Grab bars - tub/shower;Hand held shower head      Prior Function Prior Level of Function : Independent/Modified Independent  Mobility Comments: uses wheelchair in bathroom, RW in room and Rollator to go to dining room ADLs Comments: independent in bathing and dressing, facility provides meals and housekeeping, pays to have her laundry done     Hand Dominance   Dominant Hand: Left    Extremity/Trunk Assessment   Upper Extremity  Assessment Upper Extremity Assessment: Generalized weakness    Lower Extremity Assessment Lower Extremity Assessment: Generalized weakness       Communication   Communication: No difficulties  Cognition Arousal/Alertness: Awake/alert Behavior During Therapy: WFL for tasks assessed/performed Overall Cognitive Status: Within Functional Limits for tasks assessed                                          General Comments General comments (skin integrity, edema, etc.): Pt on 2L O2 via [place] on entry, however Edon not fully inserted, removed and pt found to be able to maintain SpO2 of 98%O2 on RA.        Assessment/Plan    PT Assessment Patient needs continued PT services  PT Problem List Decreased activity tolerance;Decreased balance;Decreased mobility;Decreased strength;Cardiopulmonary status limiting activity       PT Treatment Interventions DME instruction;Gait training;Functional mobility training;Therapeutic activities;Therapeutic exercise;Balance training;Cognitive remediation;Patient/family education    PT Goals (Current goals can be found in the Care Plan section)  Acute Rehab PT Goals Patient Stated Goal: get back home PT Goal Formulation: With patient Time For Goal Achievement: 04/27/22 Potential to Achieve Goals: Good    Frequency Min 3X/week        AM-PAC PT "6 Clicks" Mobility  Outcome Measure Help needed turning from your back to your side while in a flat bed without using bedrails?: None Help needed moving from lying on your back to sitting on the side of a flat bed without using bedrails?: None Help needed moving to and from a bed to a chair (including a wheelchair)?: A Little Help needed standing up from a chair using your arms (e.g., wheelchair or bedside chair)?: A Little Help needed to walk in hospital room?: A Little Help needed climbing 3-5 steps with a railing? : A Lot 6 Click Score: 19    End of Session   Activity Tolerance:  Patient tolerated treatment well Patient left: in chair;with call bell/phone within reach;with chair alarm set Nurse Communication: Mobility status;Other (comment) (IV bleeding RN notified) PT Visit Diagnosis: Unsteadiness on feet (R26.81);Muscle weakness (generalized) (M62.81);Difficulty in walking, not elsewhere classified (R26.2)    Time: 1610-9604 PT Time Calculation (min) (ACUTE ONLY): 32 min   Charges:   PT Evaluation $PT Eval Moderate Complexity: 1 Mod PT Treatments $Therapeutic Activity: 8-22 mins        Waylyn Tenbrink B. Beverely Risen PT, DPT Acute Rehabilitation Services Please use secure chat or  Call Office [phone]   Elon Alas [facility] 04/13/2022, 4:13 PM

## 2022-04-14 ENCOUNTER — Other Ambulatory Visit: Payer: Self-pay | Admitting: Cardiology

## 2022-04-14 DIAGNOSIS — K922 Gastrointestinal hemorrhage, unspecified: Secondary | ICD-10-CM | POA: Diagnosis not present

## 2022-04-14 LAB — BASIC METABOLIC PANEL
Anion gap: 9 (ref 5–15)
BUN: 30 mg/dL — ABNORMAL HIGH (ref 8–23)
CO2: 23 mmol/L (ref 22–32)
Calcium: 8.9 mg/dL (ref 8.9–10.3)
Chloride: 102 mmol/L (ref 98–111)
Creatinine, Ser: 1.67 mg/dL — ABNORMAL HIGH (ref 0.44–1.00)
GFR, Estimated: 30 mL/min — ABNORMAL LOW (ref >60)
Glucose, Bld: 163 mg/dL — ABNORMAL HIGH (ref 70–99)
Potassium: 3.6 mmol/L (ref 3.5–5.1)
Sodium: 134 mmol/L — ABNORMAL LOW (ref 135–145)

## 2022-04-14 LAB — CBC
HCT: 24.8 % — ABNORMAL LOW (ref 36.0–46.0)
Hemoglobin: 7.7 g/dL — ABNORMAL LOW (ref 12.0–15.0)
MCH: 29.4 pg (ref 26.0–34.0)
MCHC: 31 g/dL (ref 30.0–36.0)
MCV: 94.7 fL (ref 80.0–100.0)
Platelets: 179 10*3/uL (ref 150–400)
RBC: 2.62 MIL/uL — ABNORMAL LOW (ref 3.87–5.11)
RDW: 18.6 % — ABNORMAL HIGH (ref 11.5–15.5)
WBC: 3.7 10*3/uL — ABNORMAL LOW (ref 4.0–10.5)
nRBC: 0 % (ref 0.0–0.2)

## 2022-04-14 LAB — GLUCOSE, CAPILLARY
Glucose-Capillary: 186 mg/dL — ABNORMAL HIGH (ref 70–99)
Glucose-Capillary: 256 mg/dL — ABNORMAL HIGH (ref 70–99)
Glucose-Capillary: 138 mg/dL — ABNORMAL HIGH (ref 70–99)
Glucose-Capillary: 201 mg/dL — ABNORMAL HIGH (ref 70–99)

## 2022-04-14 LAB — HEMOGLOBIN AND HEMATOCRIT, BLOOD
HCT: 26.8 % — ABNORMAL LOW (ref 36.0–46.0)
HCT: 25.7 % — ABNORMAL LOW (ref 36.0–46.0)
HCT: 26.3 % — ABNORMAL LOW (ref 36.0–46.0)
Hemoglobin: 8.3 g/dL — ABNORMAL LOW (ref 12.0–15.0)
Hemoglobin: 7.9 g/dL — ABNORMAL LOW (ref 12.0–15.0)
Hemoglobin: 8.5 g/dL — ABNORMAL LOW (ref 12.0–15.0)

## 2022-04-14 MED ORDER — LIDOCAINE 5 % EX PTCH
1.0000 | MEDICATED_PATCH | CUTANEOUS | Status: DC
Start: 2022-04-14 — End: 2022-04-16
  Administered 2022-04-14 – 2022-04-15 (×2): 1 via TRANSDERMAL
  Filled 2022-04-14 (×2): qty 1

## 2022-04-14 MED ORDER — PANTOPRAZOLE SODIUM 40 MG IV SOLR
40.0000 mg | Freq: Two times a day (BID) | INTRAVENOUS | Status: DC
  Administered 2022-04-14 – 2022-04-16 (×4): 40 mg via INTRAVENOUS
  Filled 2022-04-14 (×4): qty 10

## 2022-04-14 NOTE — Progress Notes (Signed)
Patient getting bath when I went to see her.  --Continue supportive care. --Pantoprazole change gtt --> IV q 12 hours today --Advance to mechanical soft diet. --Ideally, off Eliquis indefinitely, otherwise will likely have recurrent melena/anemia; this decision (whether or not it's ok to stop Eliquis indefinitely) will need to be decision of cardiology/hospitalist team. --[facility] will follow.

## 2022-04-15 DIAGNOSIS — K922 Gastrointestinal hemorrhage, unspecified: Secondary | ICD-10-CM | POA: Diagnosis not present

## 2022-04-15 LAB — PREPARE RBC (CROSSMATCH)

## 2022-04-15 LAB — GLUCOSE, CAPILLARY
Glucose-Capillary: 160 mg/dL — ABNORMAL HIGH (ref 70–99)
Glucose-Capillary: 245 mg/dL — ABNORMAL HIGH (ref 70–99)
Glucose-Capillary: 124 mg/dL — ABNORMAL HIGH (ref 70–99)
Glucose-Capillary: 207 mg/dL — ABNORMAL HIGH (ref 70–99)

## 2022-04-15 LAB — CBC
HCT: 24.7 % — ABNORMAL LOW (ref 36.0–46.0)
Hemoglobin: 7.7 g/dL — ABNORMAL LOW (ref 12.0–15.0)
MCH: 29.5 pg (ref 26.0–34.0)
MCHC: 31.2 g/dL (ref 30.0–36.0)
MCV: 94.6 fL (ref 80.0–100.0)
Platelets: 186 10*3/uL (ref 150–400)
RBC: 2.61 MIL/uL — ABNORMAL LOW (ref 3.87–5.11)
RDW: 18.6 % — ABNORMAL HIGH (ref 11.5–15.5)
WBC: 3.8 10*3/uL — ABNORMAL LOW (ref 4.0–10.5)
nRBC: 0 % (ref 0.0–0.2)

## 2022-04-15 LAB — BASIC METABOLIC PANEL
Anion gap: 4 — ABNORMAL LOW (ref 5–15)
BUN: 28 mg/dL — ABNORMAL HIGH (ref 8–23)
CO2: 23 mmol/L (ref 22–32)
Calcium: 9.1 mg/dL (ref 8.9–10.3)
Chloride: 105 mmol/L (ref 98–111)
Creatinine, Ser: 1.81 mg/dL — ABNORMAL HIGH (ref 0.44–1.00)
GFR, Estimated: 27 mL/min — ABNORMAL LOW (ref >60)
Glucose, Bld: 160 mg/dL — ABNORMAL HIGH (ref 70–99)
Potassium: 3.8 mmol/L (ref 3.5–5.1)
Sodium: 132 mmol/L — ABNORMAL LOW (ref 135–145)

## 2022-04-15 LAB — MAGNESIUM: Magnesium: 2.5 mg/dL — ABNORMAL HIGH (ref 1.7–2.4)

## 2022-04-15 MED ORDER — SODIUM CHLORIDE 0.9% IV SOLUTION: Freq: Once | INTRAVENOUS | Status: AC

## 2022-04-15 NOTE — Progress Notes (Signed)
PROGRESS NOTE    Krista Mason  WUJ:[id number] DOB: Jan 20, 1936 DOA: 04/12/2022 PCP: [facility] [place]    Brief Narrative:   Krista Mason is a 86 y.o. female with past medical history significant for paroxysmal atrial fibrillation on Eliquis, chronic combined systolic/diastolic congestive heart failure, type 2 diabetes mellitus, essential hypertension, hyperlipidemia, CKD stage IV, anemia of chronic medical disease who presented to [facility] ED on 6/22 with complaint of shortness of breath, weakness in the setting of dark stools over the last week.  Patient with history of esophagitis/ulcer with esophageal stricture; previously admitted from 3/7-10 with esophageal ulcer treated with PPI and Carafate in which esophageal stricture was dilated during hospitalization.  Repeat EGD on 5/30 showed grade D esophagitis and benign appearing intrinsic severe stenosis in the distal esophagus.  In the ED, temperature 97.8 F, HR 76, RR 13, BP 152/56, SPO2 100% on room air.  WBC 5.8, hemoglobin 9.0, platelets 11/25/2022.  Sodium 130, potassium 3.9, chloride 92, CO2 27, glucose 421, BUN 53, creatinine 2.17, AST 14, ALT 12, total bilirubin 0.5.  BNP 195.5.  High sensitive troponin 16>14; within normal limits.  FOBT positive.  Chest x-ray with mild cardiomegaly without pulmonary edema, no active pulmonary disease process.  EKG with ventricular paced rhythm.  [facility] consulted.  Patient was transferred to [facility] under the hospitalist service for further evaluation of GI bleed/symptomatic anemia.  Assessment & Plan:    GI bleed Symptomatic anemia Patient presenting to the ED with 1 week history of dark tarry stools.  History of esophagitis/esophageal ulcer in the past.  Follows with [facility] gastroenterology, Dr. Levora Angel.  Complicated by her history of anticoagulant use due to her underlying atrial fibrillation as well as her chronic anemia. --[facility] following;  appreciate assistance --Hgb 9.0>9.1>8.4>8.6>8.9>8.1>7.7>8.3>7.7 --Holding Eliquis; for at least 2 weeks but likely indefinitely given recurrent bleed --Protonix 40mg  IV BID --Carafate TIDAC/HS --Dysphagia 3 diet --Transfuse 1 unit PRBC today, repeat H&H following transfusion (goal >8.0 given cardiac history) --CBC in am  Orthostatic hypotension Patient with 33 point drop in her systolic blood pressure from sitting to standing on admission.  Repeat orthostatic vital signs this yesterday improved with decreased symptoms. --Continue to hold home torsemide --Orthostatic vital signs daily  Paroxysmal atrial fibrillation on anticoagulation Follows with cardiology, Dr. Gala Romney outpatient.  EKG on admission with ventricular paced rhythm.  On anticoagulation outpatient with Eliquis. --Holding Eliquis as above --Continue carvedilol 12.5 mg p.o. twice daily --Continue amiodarone 100 mg 3 times weekly Monday/Wednesday/Friday  Chronic combined systolic diastolic congestive heart failure, compensated Follows with cardiology heart failure team outpatient, Dr. Gala Romney.  Home medications include carvedilol 12.5 mg p.o. twice daily, torsemide 40 mg p.o. daily.  No pulmonary edema appreciated on chest x-ray on admission. --Continue carvedilol --Holding torsemide for orthostatic hypotension --Strict I's and O's Daily weights  Type 2 diabetes mellitus, with hyperglycemia Diet controlled at home.  Hemoglobin A1c 7.4 on 12/26/2021. --Sensitive SSI for coverage --CBGs qAC/HS --Repeat hemoglobin A1c in a.m.  Essential hypertension --Carvedilol 12.5 mg p.o. twice daily --Hydralazine 50 mg p.o. twice daily  Hyperlipidemia --Atorvastatin 20 mg p.o. every other day  CKD stage IV Baseline creatinine 1.9-2.3.  Follows with nephrology outpatient, Dr. Signe Colt.  Creatinine 1.81 this morning, stable. --Avoid nephrotoxins, renal dose all medications --BMP daily  Anemia of chronic medical/renal  disease Patient receives intermittent iron transfusions outpatient by nephrology, Dr. Signe Colt.  Anemia panel with iron 21, TIBC 314, saturation 7. --Ferrlecit  250mg  IV daily x 2 --Repeat CBC in am  DVT prophylaxis: SCDs Start: 04/12/22 1928    Code Status: DNR Family Communication: No family present at bedside this morning  Disposition Plan:  Level of care: Telemetry Medical Status is: Inpatient Remains inpatient appropriate because: Continue to closely monitor hemoglobin, anticipate discharge back to [facility] independent living facility with home health and likely 1-2 days    Consultants:  [facility]  Procedures:  None  Antimicrobials:  None   Subjective: Patient seen and examined at bedside, resting comfortably.  Sitting in bedside chair.  Reported bowel movements earlier this morning, denies any blood in the stool.  Hemoglobin down to 7.7 this morning, will transfuse 1 unit PRBC.  No other questions or concerns at this time.  Denies headache, no visual changes, no chest pain, no palpitations, no fever/chills/night sweats, no nausea/vomiting/diarrhea, no cough/congestion, no focal weakness, no paresthesias.  No acute events overnight per nursing staff  Objective: Vitals:   04/15/22 0520 04/15/22 0848 04/15/22 1046 04/15/22 1103  BP: (!) 144/62 (!) 155/52 (!) 141/43 (!) 144/56  Pulse: 73 72 70 70  Resp: 16 18 19 18   Temp: (!) 97.5 F (36.4 C) 97.6 F (36.4 C) 98.1 F (36.7 C) 98.3 F (36.8 C)  TempSrc: Oral Oral Oral Oral  SpO2: 98% 100% 100% 100%  Weight:      Height:        Intake/Output Summary (Last 24 hours) at 04/15/2022 1343 Last data filed at 04/15/2022 1046 Gross per 24 hour  Intake 796.73 ml  Output 300 ml  Net 496.73 ml   Filed Weights   04/12/22 0934  Weight: 67.8 kg    Examination:  Physical Exam: GEN: NAD, alert and oriented x 3, elderly in appearance HEENT: NCAT, PERRL, EOMI, sclera clear, MMM PULM: CTAB w/o wheezes/crackles, normal  respiratory effort, on room air at rest with SPO2 98%. CV: RRR w/o M/G/R GI: abd soft, NTND, NABS, no R/G/M MSK: no peripheral edema, muscle strength globally intact 5/5 bilateral upper/lower extremities NEURO: CN II-XII intact, no focal deficits, sensation to light touch intact PSYCH: normal mood/affect Integumentary: dry/intact, no rashes or wounds    Data Reviewed: I have personally reviewed following labs and imaging studies  CBC: Recent Labs  Lab 04/12/22 0947 04/12/22 1929 04/13/22 0310 04/13/22 0925 04/14/22 0131 04/14/22 0749 04/14/22 1258 04/14/22 2136 04/15/22 0212  WBC 5.8  --  4.4  --  3.7*  --   --   --  3.8*  NEUTROABS 4.3  --   --   --   --   --   --   --   --   HGB 9.0*   < > 8.4*   < > 7.7* 8.3* 7.9* 8.5* 7.7*  HCT 28.2*   < > 26.3*   < > 24.8* 26.8* 25.7* 26.3* 24.7*  MCV 94.3  --  94.9  --  94.7  --   --   --  94.6  PLT 224  --  190  --  179  --   --   --  186   < > = values in this interval not displayed.   Basic Metabolic Panel: Recent Labs  Lab 04/12/22 0947 04/13/22 0310 04/14/22 0131 04/15/22 0212  NA 130* 135 134* 132*  K 3.9 3.7 3.6 3.8  CL 92* 99 102 105  CO2 27 28 23 23   GLUCOSE 421* 216* 163* 160*  BUN 53* 40* 30* 28*  CREATININE 2.17* 1.95*  1.67* 1.81*  CALCIUM 9.8 9.2 8.9 9.1  MG  --   --   --  2.5*   GFR: Estimated Creatinine Clearance: 20.8 mL/min (A) (by C-G formula based on SCr of 1.81 mg/dL (H)). Liver Function Tests: Recent Labs  Lab 04/12/22 0947  AST 14*  ALT 12  ALKPHOS 75  BILITOT 0.5  PROT 7.0  ALBUMIN 3.8   No results for input(s): "LIPASE", "AMYLASE" in the last 168 hours. No results for input(s): "AMMONIA" in the last 168 hours. Coagulation Profile: No results for input(s): "INR", "PROTIME" in the last 168 hours. Cardiac Enzymes: No results for input(s): "CKTOTAL", "CKMB", "CKMBINDEX", "TROPONINI" in the last 168 hours. BNP (last 3 results) No results for input(s): "PROBNP" in the last 8760  hours. HbA1C: No results for input(s): "HGBA1C" in the last 72 hours. CBG: Recent Labs  Lab 04/14/22 1143 04/14/22 1628 04/14/22 2116 04/15/22 0715 04/15/22 1137  GLUCAP 256* 138* 201* 160* 245*   Lipid Profile: No results for input(s): "CHOL", "HDL", "LDLCALC", "TRIG", "CHOLHDL", "LDLDIRECT" in the last 72 hours. Thyroid Function Tests: No results for input(s): "TSH", "T4TOTAL", "FREET4", "T3FREE", "THYROIDAB" in the last 72 hours. Anemia Panel: Recent Labs    04/13/22 0310  TIBC 314  IRON 21*   Sepsis Labs: No results for input(s): "PROCALCITON", "LATICACIDVEN" in the last 168 hours.  No results found for this or any previous visit (from the past 240 hour(s)).       Radiology Studies: No results found.      Scheduled Meds:  amiodarone  100 mg Oral Once per day on Mon Wed Fri   atorvastatin  20 mg Oral QODAY   carvedilol  12.5 mg Oral BID WC   hydrALAZINE  50 mg Oral BID   insulin aspart  0-5 Units Subcutaneous QHS   insulin aspart  0-9 Units Subcutaneous TID WC   lidocaine  1 patch Transdermal Q24H   pantoprazole (PROTONIX) IV  40 mg Intravenous Q12H   sucralfate  1 g Oral TID AC & HS   Continuous Infusions:     LOS: 3 days    Time spent: 48 minutes spent on chart review, discussion with nursing staff, consultants, updating family and interview/physical exam; more than 50% of that time was spent in counseling and/or coordination of care.    Alvira Philips [place], DO [facility] Available via Epic secure chat 7am-7pm After these hours, please refer to coverage provider listed on [email or web address] 04/15/2022, 1:43 PM

## 2022-04-16 DIAGNOSIS — K922 Gastrointestinal hemorrhage, unspecified: Secondary | ICD-10-CM | POA: Diagnosis not present

## 2022-04-16 DIAGNOSIS — Z7901 Long term (current) use of anticoagulants: Secondary | ICD-10-CM | POA: Diagnosis not present

## 2022-04-16 DIAGNOSIS — K921 Melena: Secondary | ICD-10-CM | POA: Diagnosis not present

## 2022-04-16 DIAGNOSIS — D62 Acute posthemorrhagic anemia: Secondary | ICD-10-CM | POA: Diagnosis not present

## 2022-04-16 LAB — TYPE AND SCREEN
ABO/RH(D): A POS
Antibody Screen: NEGATIVE
Unit division: 0

## 2022-04-16 LAB — BASIC METABOLIC PANEL
Anion gap: 10 (ref 5–15)
BUN: 25 mg/dL — ABNORMAL HIGH (ref 8–23)
CO2: 22 mmol/L (ref 22–32)
Calcium: 9.5 mg/dL (ref 8.9–10.3)
Chloride: 103 mmol/L (ref 98–111)
Creatinine, Ser: 1.83 mg/dL — ABNORMAL HIGH (ref 0.44–1.00)
GFR, Estimated: 27 mL/min — ABNORMAL LOW (ref >60)
Glucose, Bld: 148 mg/dL — ABNORMAL HIGH (ref 70–99)
Potassium: 3.9 mmol/L (ref 3.5–5.1)
Sodium: 135 mmol/L (ref 135–145)

## 2022-04-16 LAB — CBC
HCT: 29.2 % — ABNORMAL LOW (ref 36.0–46.0)
Hemoglobin: 9.3 g/dL — ABNORMAL LOW (ref 12.0–15.0)
MCH: 29.4 pg (ref 26.0–34.0)
MCHC: 31.8 g/dL (ref 30.0–36.0)
MCV: 92.4 fL (ref 80.0–100.0)
Platelets: 187 10*3/uL (ref 150–400)
RBC: 3.16 MIL/uL — ABNORMAL LOW (ref 3.87–5.11)
RDW: 19.4 % — ABNORMAL HIGH (ref 11.5–15.5)
WBC: 5 10*3/uL (ref 4.0–10.5)
nRBC: 0 % (ref 0.0–0.2)

## 2022-04-16 LAB — BPAM RBC
Blood Product Expiration Date: 202307102359
ISSUE DATE / TIME: 202306251040
Unit Type and Rh: 6200

## 2022-04-16 LAB — HEMOGLOBIN A1C
Hgb A1c MFr Bld: 7.8 % — ABNORMAL HIGH (ref 4.8–5.6)
Mean Plasma Glucose: 177.16 mg/dL

## 2022-04-16 LAB — GLUCOSE, CAPILLARY: Glucose-Capillary: 195 mg/dL — ABNORMAL HIGH (ref 70–99)

## 2022-04-16 MED ORDER — SUCRALFATE 1 GM/10ML PO SUSP: 1.0000 g | Freq: Four times a day (QID) | ORAL | 2 refills | Status: DC

## 2022-04-16 MED ORDER — PANTOPRAZOLE SODIUM 40 MG PO TBEC
40.0000 mg | DELAYED_RELEASE_TABLET | Freq: Two times a day (BID) | ORAL | 2 refills | Status: DC
Start: 2022-04-16 — End: 2022-05-21

## 2022-04-17 DIAGNOSIS — Z87891 Personal history of nicotine dependence: Secondary | ICD-10-CM | POA: Diagnosis not present

## 2022-04-17 DIAGNOSIS — I951 Orthostatic hypotension: Secondary | ICD-10-CM | POA: Diagnosis not present

## 2022-04-17 DIAGNOSIS — I5042 Chronic combined systolic (congestive) and diastolic (congestive) heart failure: Secondary | ICD-10-CM | POA: Diagnosis not present

## 2022-04-17 DIAGNOSIS — D631 Anemia in chronic kidney disease: Secondary | ICD-10-CM | POA: Diagnosis not present

## 2022-04-17 DIAGNOSIS — M1990 Unspecified osteoarthritis, unspecified site: Secondary | ICD-10-CM | POA: Diagnosis not present

## 2022-04-17 DIAGNOSIS — I480 Paroxysmal atrial fibrillation: Secondary | ICD-10-CM | POA: Diagnosis not present

## 2022-04-17 DIAGNOSIS — E1122 Type 2 diabetes mellitus with diabetic chronic kidney disease: Secondary | ICD-10-CM | POA: Diagnosis not present

## 2022-04-17 DIAGNOSIS — N184 Chronic kidney disease, stage 4 (severe): Secondary | ICD-10-CM | POA: Diagnosis not present

## 2022-04-17 DIAGNOSIS — E785 Hyperlipidemia, unspecified: Secondary | ICD-10-CM | POA: Diagnosis not present

## 2022-04-17 DIAGNOSIS — K254 Chronic or unspecified gastric ulcer with hemorrhage: Secondary | ICD-10-CM | POA: Diagnosis not present

## 2022-04-17 DIAGNOSIS — J449 Chronic obstructive pulmonary disease, unspecified: Secondary | ICD-10-CM | POA: Diagnosis not present

## 2022-04-17 DIAGNOSIS — I493 Ventricular premature depolarization: Secondary | ICD-10-CM | POA: Diagnosis not present

## 2022-04-17 DIAGNOSIS — I130 Hypertensive heart and chronic kidney disease with heart failure and stage 1 through stage 4 chronic kidney disease, or unspecified chronic kidney disease: Secondary | ICD-10-CM | POA: Diagnosis not present

## 2022-04-17 DIAGNOSIS — Z950 Presence of cardiac pacemaker: Secondary | ICD-10-CM | POA: Diagnosis not present

## 2022-04-17 DIAGNOSIS — K222 Esophageal obstruction: Secondary | ICD-10-CM | POA: Diagnosis not present

## 2022-04-17 DIAGNOSIS — I428 Other cardiomyopathies: Secondary | ICD-10-CM | POA: Diagnosis not present

## 2022-04-17 DIAGNOSIS — K2100 Gastro-esophageal reflux disease with esophagitis, without bleeding: Secondary | ICD-10-CM | POA: Diagnosis not present

## 2022-04-20 ENCOUNTER — Encounter (HOSPITAL_COMMUNITY): Payer: Self-pay

## 2022-04-20 ENCOUNTER — Ambulatory Visit (HOSPITAL_COMMUNITY)
Admission: RE | Admit: 2022-04-20 | Discharge: 2022-04-20 | Disposition: A | Discharge status: Home / Self Care | Payer: Medicare HMO | Source: Ambulatory Visit | Attending: Family Medicine | Admitting: Family Medicine

## 2022-04-20 VITALS — BP 118/70 | HR 68 | Wt 145.6 lb

## 2022-04-20 DIAGNOSIS — I10 Essential (primary) hypertension: Secondary | ICD-10-CM | POA: Diagnosis not present

## 2022-04-20 DIAGNOSIS — N184 Chronic kidney disease, stage 4 (severe): Secondary | ICD-10-CM | POA: Diagnosis not present

## 2022-04-20 DIAGNOSIS — J984 Other disorders of lung: Secondary | ICD-10-CM | POA: Diagnosis not present

## 2022-04-20 DIAGNOSIS — I428 Other cardiomyopathies: Secondary | ICD-10-CM | POA: Insufficient documentation

## 2022-04-20 DIAGNOSIS — Z79899 Other long term (current) drug therapy: Secondary | ICD-10-CM | POA: Insufficient documentation

## 2022-04-20 DIAGNOSIS — E1122 Type 2 diabetes mellitus with diabetic chronic kidney disease: Secondary | ICD-10-CM | POA: Diagnosis not present

## 2022-04-20 DIAGNOSIS — I447 Left bundle-branch block, unspecified: Secondary | ICD-10-CM

## 2022-04-20 DIAGNOSIS — D500 Iron deficiency anemia secondary to blood loss (chronic): Secondary | ICD-10-CM

## 2022-04-20 DIAGNOSIS — I493 Ventricular premature depolarization: Secondary | ICD-10-CM

## 2022-04-20 DIAGNOSIS — I130 Hypertensive heart and chronic kidney disease with heart failure and stage 1 through stage 4 chronic kidney disease, or unspecified chronic kidney disease: Secondary | ICD-10-CM | POA: Insufficient documentation

## 2022-04-20 DIAGNOSIS — I452 Bifascicular block: Secondary | ICD-10-CM | POA: Diagnosis not present

## 2022-04-20 DIAGNOSIS — I739 Peripheral vascular disease, unspecified: Secondary | ICD-10-CM | POA: Diagnosis not present

## 2022-04-20 DIAGNOSIS — I5022 Chronic systolic (congestive) heart failure: Secondary | ICD-10-CM | POA: Diagnosis not present

## 2022-04-20 DIAGNOSIS — Z4502 Encounter for adjustment and management of automatic implantable cardiac defibrillator: Secondary | ICD-10-CM | POA: Diagnosis not present

## 2022-04-20 DIAGNOSIS — I2510 Atherosclerotic heart disease of native coronary artery without angina pectoris: Secondary | ICD-10-CM

## 2022-04-20 DIAGNOSIS — I480 Paroxysmal atrial fibrillation: Secondary | ICD-10-CM

## 2022-04-20 DIAGNOSIS — D472 Monoclonal gammopathy: Secondary | ICD-10-CM | POA: Diagnosis not present

## 2022-04-20 DIAGNOSIS — I5042 Chronic combined systolic (congestive) and diastolic (congestive) heart failure: Secondary | ICD-10-CM | POA: Diagnosis not present

## 2022-04-20 LAB — BASIC METABOLIC PANEL
Anion gap: 13 (ref 5–15)
BUN: 34 mg/dL — ABNORMAL HIGH (ref 8–23)
CO2: 24 mmol/L (ref 22–32)
Calcium: 9.6 mg/dL (ref 8.9–10.3)
Chloride: 96 mmol/L — ABNORMAL LOW (ref 98–111)
Creatinine, Ser: 2.44 mg/dL — ABNORMAL HIGH (ref 0.44–1.00)
GFR, Estimated: 19 mL/min — ABNORMAL LOW (ref >60)
Glucose, Bld: 346 mg/dL — ABNORMAL HIGH (ref 70–99)
Potassium: 4.3 mmol/L (ref 3.5–5.1)
Sodium: 133 mmol/L — ABNORMAL LOW (ref 135–145)

## 2022-04-20 LAB — CBC
HCT: 34.1 % — ABNORMAL LOW (ref 36.0–46.0)
Hemoglobin: 10.5 g/dL — ABNORMAL LOW (ref 12.0–15.0)
MCH: 29.2 pg (ref 26.0–34.0)
MCHC: 30.8 g/dL (ref 30.0–36.0)
MCV: 94.7 fL (ref 80.0–100.0)
Platelets: 229 10*3/uL (ref 150–400)
RBC: 3.6 MIL/uL — ABNORMAL LOW (ref 3.87–5.11)
RDW: 18.2 % — ABNORMAL HIGH (ref 11.5–15.5)
WBC: 8.2 10*3/uL (ref 4.0–10.5)
nRBC: 0 % (ref 0.0–0.2)

## 2022-04-20 LAB — BRAIN NATRIURETIC PEPTIDE: B Natriuretic Peptide: 101.1 pg/mL — ABNORMAL HIGH (ref 0.0–100.0)

## 2022-04-20 MED ORDER — TORSEMIDE 20 MG PO TABS: 40.0000 mg | ORAL_TABLET | Freq: Every day | ORAL | 6 refills | Status: DC

## 2022-04-20 NOTE — Patient Instructions (Addendum)
Thank you for coming in today   Labs were done today, if any labs are abnormal the clinic will call you No news is good news  STAY OFF Eliquis  CONTINUE Torsemide 40 mg 2 tablets daily   Your physician recommends that you schedule a follow-up appointment in:  3-4 months with Dr. Haroldine Laws  At the [facility], you and your health needs are our priority. As part of our continuing mission to provide you with exceptional heart care, we have created designated Provider Care Teams. These Care Teams include your primary Cardiologist (physician) and Advanced Practice Providers (APPs- Physician Assistants and Nurse Practitioners) who all work together to provide you with the care you need, when you need it.   You may see any of the following providers on your designated Care Team at your next follow up: Dr Glori Bickers Dr Haynes Kerns, NP Lyda Jester, [place] [facility] [place], [place] Audry Riles, PharmD   Please be sure to bring in all your medications bottles to every appointment.   If you have any questions or concerns before your next appointment please send [place] a message through [place] or call our office at [phone].    TO LEAVE A MESSAGE FOR THE NURSE SELECT OPTION 2, PLEASE LEAVE A MESSAGE INCLUDING: YOUR NAME DATE OF BIRTH CALL BACK NUMBER REASON FOR CALL**this is important as we prioritize the call backs  YOU WILL RECEIVE A CALL BACK THE SAME DAY AS [place] AS YOU CALL BEFORE 4:00 PM

## 2022-04-20 NOTE — Addendum Note (Signed)
Encounter addended by: Rafael Bihari, FNP on: 04/20/2022 3:20 PM  Actions taken: Clinical Note Signed

## 2022-04-20 NOTE — Progress Notes (Addendum)
Advanced Heart Failure Clinic Note    HF Cardiologist: Dr Vaughan Browner Nephrologist: Dr Hollie Salk  HPI: Krista Mason is a 86 y.o. female with a history of HTN, HL, CKD 3, moderate mitral regurgitation, DM, and chronic systolic heart failure .  Admitted 4/19 with dyspnea. ECHO had gone down to 35-40% (previously 45%). Had RHC with normal filling pressures and cardiac output. Creatinine peaked at 2.1 so lasix was held for few days after discharge. Myeloma panel - 0.3 M protein noted. PYP scan was negative for TTR. Has seen Dr. Beryle Beams and not felt to have myeloma.   Has had CPX test 5/209 which showed - submax test with moderate to severe functional limitation due to HF, lung disease and deconditioning.    Echo 3/20 EF 35-40% moderate MR. RV ok   Had GI bleed in 10/20. EGD with grade D esophagitis and stricture  Underwent MDT CRT-D on 07/24/19 subsequently found to have episodes of AF.  Amiodarone started.   Echo 3/21 EF 45-50%   Diagnosed with Covid in early May. Admitted to hospital in 5/22 with acute on chronic anemia secondary to upper GI bleeding: EGD 02/28/21 per Dr. Michail Sermon noted severe distal ulcerated esophagitis w/ a stricture.  Eliquis held for 2 weeks. Discharged to SNF. Remains at [facility].   Follow up 12/22 Hgb 6.6 -> 7.2. Got 1u RBC and IV iron. EGD with esophageal ulcer and moderate stricture. Continued with iron infusions.  Echo 10/12/21 EF 45-50% moderate MR.   Follow up 2/23, NYHA III in setting of anemia. Volume up and torsemide increased.  Admitted 3/23 with upper GIB. Eliquis held, underwent EGD showing stricture and dilated. HF compensated.   Admitted 6/23 with GIB. Received 1 U PRBCs. Eliquis stopped (x 2 weeks). Hgb improved from 7.7.>>9.3 on discharge. No changes to GDMT. Discharged home, weight 149 lbs.  Today she returns for post hospital HF follow up with her son. Overall feeling fine, mildly fatigued when she pushes herself. Remains at assisted living  facility. She has mild SOB walking with her walker on flat ground. Remains off Eliquis and no further bleeding in stools.  Denies palpitations, CP, dizziness, edema, or PND/Orthopnea. Appetite ok. No fever or chills. Weight at home 143 pounds. Taking all medications, has to crush them due to eso strictures. Planning repeat EGD next month.   Cardiac studies:  - Echo (12/22): EF 45-50%, moderate MR  - CPX (5/19) FVC 1.97 (73%)      FEV1 1.24 (61%)        FEV1/FVC 63 (81%)        MVV 49 (59%)       Resting HR: 58 Peak HR: 110   (78% age predicted max HR) BP rest: 152/66 BP peak: 214/70 Peak VO2: 9.6 (57% predicted peak VO2) VE/VCO2 slope:  50 OUES: 0.87 Peak RER: 0.93 VE/MVV:  70% O2pulse:  6   (75% predicted O2pulse)   - PYP (4/19): scan negative for TTR  - RHC 4/19  RA = 3 RV = 29/3 PA = 25/12 (16) PCW = 8 Fick cardiac output/index = 5.3/3.0 PVR = 1.5 WU Ao sat = 97% PA sat = 65%, 67%   1. Normal filling pressures and outputs  - LHC 4/18  Heavily calcified but non-obstructive CAD. 50% RCA and 30% LAD.    - ECHO 4/19 EF 35% Severe concentric LVH with diffuse hypokinesis and paradoxical   septal motion. Thickened aortic and mitral valve leaflets. Mild   aortic regurgitation and moderate mitral  regurgitation. Trivial   posteriorly located pericardial effusion.   Evaluation for cardiac amyloidosis should be considered .  - ECHO 2018, the LVEF is improved from   35-40% up to 40-45%. There is persistent moderate mitral   regurgitation.  Review of systems complete and found to be negative unless listed in HPI.    SH:  Social History   Socioeconomic History   Marital status: Widowed    Spouse name: Not on file   Number of children: Not on file   Years of education: Not on file   Highest education level: Not on file  Occupational History   Not on file  Tobacco Use   Smoking status: Former    Packs/day: 0.75    Years: 60.00    Total pack years: 45.00    Types:  Cigarettes    Quit date: 11/04/2016    Years since quitting: 5.4   Smokeless tobacco: Never  Vaping Use   Vaping Use: Never used  Substance and Sexual Activity   Alcohol use: No   Drug use: No   Sexual activity: Not on file  Other Topics Concern   Not on file  Social History Narrative   [facility]ives at [facility].   Social Determinants of Health   Financial Resource Strain: Not on file  Food Insecurity: Not on file  Transportation Needs: Not on file  Physical Activity: Not on file  Stress: Not on file  Social Connections: Not on file  Intimate Partner Violence: Not on file   FH:  Family History  Problem Relation Age of Onset   Heart attack Father    Heart disease Father    Cancer Father    Hypertension Father    Hypertension Brother    Stroke Brother    Hypertension Brother    Diabetes Son    Hyperlipidemia Son    Hypertension Son    Past Medical History:  Diagnosis Date   Anemia    Atrial fibrillation ([place])    Chronic combined systolic and diastolic CHF (congestive heart failure) ([place])    a. LV dysfcuntion felt to be related to PVCs   Chronic kidney disease, stage 4 (severe) (H[facility])    Diabetes mellitus without complication (HCC)    GERD (gastroesophageal reflux disease)    Hx of echocardiogram    Echo (1/16):  EF 60-65%, no RWMA, Gr 1 DD, mod AI, mod MR, mild LAE   Hyperkalemia    Hyperlipidemia    Hypertension    NICM (nonischemic cardiomyopathy) ([place])    a. 12/2018 Echo: EF 35%, diff HK w/ septal-lateral dyssynchrony. Nl RV fxn; b. 07/2019 s/p MDT [id number] [name] CRT-P MRI SureScan device (ser #: [id number])   Orthostatic hypotension    PVC (premature ventricular contraction)    a. s/p PVC ablation in 11/2016   Rheumatic fever    Wears dentures    Current Outpatient Medications  Medication Sig Dispense Refill   acetaminophen (TYLENOL) 500 MG tablet Take 500 mg by mouth at bedtime.     amiodarone (PACERONE) 100 MG tablet Take 100 mg by  mouth 3 (three) times a week.     atorvastatin (LIPITOR) 20 MG tablet Take 20 mg by mouth every other day.     carvedilol (COREG) 12.5 MG tablet TAKE ONE TABLET BY MOUTH TWICE DAILY WITH A MEAL 60 tablet 5   cholecalciferol (VITAMIN D3) 25 MCG (1000 UNIT) tablet Take 1,000 Units by mouth every morning.     Cyanocobalamin (VITAMIN  B 12 PO) Take 1 tablet by mouth daily in the afternoon.     diphenhydrAMINE (BENADRYL) 25 mg capsule Take 25 mg by mouth every 6 (six) hours as needed (allergies/sleep).     Ensure (ENSURE) Take 237 mLs by mouth daily.     HM LIDOCAINE PATCH EX Apply 5 % topically as needed.     hydrALAZINE (APRESOLINE) 50 MG tablet TAKE ONE TABLET BY MOUTH TWICE DAILY 180 tablet 3   pantoprazole (PROTONIX) 40 MG tablet Take 1 tablet (40 mg total) by mouth in the morning and at bedtime. 60 tablet 2   Polyethyl Glycol-Propyl Glycol 0.4-0.3 % SOLN Place 1 drop into both eyes every 8 (eight) hours as needed (dryness).     polyethylene glycol (MIRALAX / GLYCOLAX) 17 g packet Take 17 g by mouth daily as needed for mild constipation (constipation.).     sucralfate (CARAFATE) 1 GM/10ML suspension Take 10 mLs (1 g total) by mouth 4 (four) times daily. 1200 mL 2   torsemide (DEMADEX) 20 MG tablet Take 2 tablets (40 mg total) by mouth every morning for 3 days, THEN 1 tablet (20 mg total) every evening for 3 days, THEN 2 tablets (40 mg total) daily. 60 tablet 3   No current facility-administered medications for this encounter.   BP 118/70   Pulse 68   Wt 66 kg (145 lb 9.6 oz)   SpO2 100%   BMI 24.23 kg/m   Wt Readings from Last 3 Encounters:  04/20/22 66 kg (145 lb 9.6 oz)  04/12/22 67.8 kg (149 lb 7.6 oz)  03/20/22 65.8 kg (145 lb)   PHYSICAL EXAM: General:  NAD. No resp difficulty, elderly, walked into clinic with RW HEENT: Normal Neck: Supple. No JVD. Carotids 2+ bilat; no bruits. No lymphadenopathy or thryomegaly appreciated. Cor: PMI nondisplaced. Regular rate & rhythm. No rubs,  gallops or murmurs. Lungs: Clear Abdomen: Soft, nontender, nondistended. No hepatosplenomegaly. No bruits or masses. Good bowel sounds. Extremities: No cyanosis, clubbing, rash, edema Neuro: Alert & oriented x 3, cranial nerves grossly intact. Moves all 4 extremities w/o difficulty. Affect pleasant.  ICD interrogation: unable to interrogate device in clinic.  ECG: a-paced 72 bpm (personally reviewed).  ASSESSMENT & PLAN: 1. Chronic combined CHF: NICM s/p PVC ablation. Now with LBBB. Echo 8/18 EF 40% (unchanged from prior).  - Echo 01/2018 LVEF 35-40%, Moderate MR, LVH. Concern for amyloid.  - PYP scan negative for TTR amyloid. Has MGUS and has seen Dr. Beryle Beams but low suspicion for AL amyloid/myeloma. 24 hour urine with no Mspike - Echo 3/20 EF stable at 35-40% with moderate MR - CPX 5/19 poor efforts RER 0.9 but suggests severe multifactorial limitation but HF predominates. Unfortunately, not candidate for advanced therapies due to age and comorbidities. - s/p CRT-D on 07/24/19. ICD interrogation as above. - Echo 3/21 EF 45-50%.  - Echo 10/12/21 EF 45-50% moderate MR - much improved with CRT. NYHA II, volume looks OK today on exam. - Continue torsemide 40 mg daily.  - Continue carvedilol 12.5 mg bid. - Continue hydralazine 50 bid. Having trouble swallowing pills, will see if we an arrange po suspension. - No ACE/ARB/ARNI/spiro/SGLT2i with CKD IV - BMET/BNP today.  2. CAD - Last cath 01/2017 with heavily calcified but non-obstructive CAD. 50% RCA and 30% LAD.  - No current angina. - CT chest stable with old granulomatous disease. - Continue statin.  3. PVCs - s/p ablation. On low-dose amiodarone therapy. PVC burden low. - none on ECG today. -  Denies palpitations.   4. LBBB - EF 35-40% on echo 3/20 + moderate MR. LBBB ~146m. - s/p CRT 07/24/19.  - EF 45-50% on echo 3/21 and 10/12/21.  6. CKD IV - Baseline creatinine 1.7-1.9.  - Following with Dr UHollie Salk - Check labs  today.  7. HTN - Well-controlled. - Stable.  8. PAD - ABIs showed moderate disease BLE. Has seen [facility]. Medical therapy for now.  - No change.   9. PAF - Previously on Eliquis, now on hold with GIB. - Quiescent on amio.  - With frequent hospitalizations 2/2 GIB, she will need to stop Eliquis indefinitely. Discussed with Dr. BHaroldine Laws - Not candidate for [facility] due to CKD and frailty.  10. Iron-deficient anemia with recurrent GI bleeding - multifactorial with CKD and GIB, previously on Eliquis. - 2 recent admissions with recurrent GIB. - Stop Eliquis as above. - Getting feraheme and RBC transfusions per GI and PCP. - Ideally keep hgb > 8.0. - CBC today.  Follow up in 3-4 months with Dr. BHaroldine Laws  JRafael Bihari FNP  9:34 AM

## 2022-04-23 ENCOUNTER — Telehealth (HOSPITAL_COMMUNITY): Payer: Self-pay | Admitting: Surgery

## 2022-04-23 MED ORDER — TORSEMIDE 20 MG PO TABS: ORAL_TABLET | ORAL | 6 refills | Status: DC

## 2022-04-23 NOTE — Telephone Encounter (Signed)
-----   Message from Rafael Bihari, [place] sent at 04/20/2022  5:02 PM EDT ----- SCr elevated. Please decrease to 40 mg daily alternating with 20 mg every other day.  Repeat BMET in 7-10 days

## 2022-04-23 NOTE — Telephone Encounter (Signed)
-----   Message from Rafael Bihari, [place] sent at 04/20/2022  5:02 PM EDT ----- SCr elevated. Please decrease to 40 mg daily alternating with 20 mg every other day.  Repeat BMET in 7-10 days

## 2022-04-23 NOTE — Telephone Encounter (Signed)
I attempted to reach patient regarding results and recommendations per provider.  I left a message for a return call.  Clarifying with provider that the reduction is for Demadex dosage.

## 2022-04-23 NOTE — Telephone Encounter (Signed)
I called patient to review results and recommendations per provider.  She will have repeat labwork at scheduled Nephrology appt --Dr. Hollie Salk in 2 weeks because of transportation challenges and preference.  She will request that those be forwarded to our clinic as well. Medication list updated in [facility].

## 2022-04-25 ENCOUNTER — Other Ambulatory Visit (HOSPITAL_COMMUNITY): Payer: Self-pay | Admitting: Surgery

## 2022-04-25 ENCOUNTER — Telehealth (HOSPITAL_COMMUNITY): Payer: Self-pay | Admitting: Surgery

## 2022-04-25 ENCOUNTER — Encounter (HOSPITAL_COMMUNITY): Payer: Medicare HMO

## 2022-04-25 DIAGNOSIS — I5022 Chronic systolic (congestive) heart failure: Secondary | ICD-10-CM

## 2022-04-25 NOTE — Telephone Encounter (Signed)
Ms Lick called back and requested to repeat lab work at her infusion appt.  I have entered orders for labwork for her upcoming appt 05/01/2022.

## 2022-05-01 ENCOUNTER — Ambulatory Visit (INDEPENDENT_AMBULATORY_CARE_PROVIDER_SITE_OTHER): Payer: Medicare HMO

## 2022-05-01 ENCOUNTER — Ambulatory Visit (HOSPITAL_COMMUNITY)
Admission: RE | Admit: 2022-05-01 | Discharge: 2022-05-01 | Disposition: A | Discharge status: Home / Self Care | Payer: Medicare HMO | Source: Ambulatory Visit | Attending: Nephrology | Admitting: Nephrology

## 2022-05-01 VITALS — BP 179/66 | HR 69 | Temp 98.0°F | Resp 18

## 2022-05-01 DIAGNOSIS — I428 Other cardiomyopathies: Secondary | ICD-10-CM

## 2022-05-01 DIAGNOSIS — N184 Chronic kidney disease, stage 4 (severe): Secondary | ICD-10-CM | POA: Diagnosis present

## 2022-05-01 LAB — CUP PACEART REMOTE DEVICE CHECK
Battery Remaining Longevity: 85 mo
Battery Voltage: 2.98 V
Brady Statistic AP VP Percent: 72.61 %
Brady Statistic AP VS Percent: 0.05 %
Brady Statistic AS VP Percent: 20.63 %
Brady Statistic AS VS Percent: 6.7 %
Brady Statistic RA Percent Paced: 72.34 %
Brady Statistic RV Percent Paced: 93.24 %
Date Time Interrogation Session: 20230710225603
Implantable Lead Implant Date: 20201002
Implantable Lead Implant Date: 20201002
Implantable Lead Implant Date: 20201002
Implantable Lead Location: 753858
Implantable Lead Location: 753859
Implantable Lead Location: 753860
Implantable Lead Model: 4598
Implantable Lead Model: 5076
Implantable Lead Model: 5076
Implantable Pulse Generator Implant Date: 20201002
Lead Channel Impedance Value: 304 Ohm
Lead Channel Impedance Value: 323 Ohm
Lead Channel Impedance Value: 361 Ohm
Lead Channel Impedance Value: 399 Ohm
Lead Channel Impedance Value: 399 Ohm
Lead Channel Impedance Value: 456 Ohm
Lead Channel Impedance Value: 456 Ohm
Lead Channel Impedance Value: 532 Ohm
Lead Channel Impedance Value: 551 Ohm
Lead Channel Impedance Value: 589 Ohm
Lead Channel Impedance Value: 589 Ohm
Lead Channel Impedance Value: 608 Ohm
Lead Channel Impedance Value: 608 Ohm
Lead Channel Impedance Value: 646 Ohm
Lead Channel Pacing Threshold Amplitude: 0.625 V
Lead Channel Pacing Threshold Amplitude: 0.625 V
Lead Channel Pacing Threshold Amplitude: 2.75 V
Lead Channel Pacing Threshold Pulse Width: 0.4 ms
Lead Channel Pacing Threshold Pulse Width: 0.4 ms
Lead Channel Pacing Threshold Pulse Width: 0.4 ms
Lead Channel Sensing Intrinsic Amplitude: 2.375 mV
Lead Channel Sensing Intrinsic Amplitude: 2.375 mV
Lead Channel Sensing Intrinsic Amplitude: 31.625 mV
Lead Channel Sensing Intrinsic Amplitude: 31.625 mV
Lead Channel Setting Pacing Amplitude: 1.5 V
Lead Channel Setting Pacing Amplitude: 2 V
Lead Channel Setting Pacing Amplitude: 2.25 V
Lead Channel Setting Pacing Pulse Width: 0.4 ms
Lead Channel Setting Pacing Pulse Width: 0.4 ms
Lead Channel Setting Sensing Sensitivity: 1.2 mV

## 2022-05-01 LAB — IRON AND TIBC
Iron: 60 ug/dL (ref 28–170)
Saturation Ratios: 17 % (ref 10.4–31.8)
TIBC: 349 ug/dL (ref 250–450)
UIBC: 289 ug/dL

## 2022-05-01 LAB — FERRITIN: Ferritin: 178 ng/mL (ref 11–307)

## 2022-05-01 LAB — BASIC METABOLIC PANEL
Anion gap: 12 (ref 5–15)
BUN: 42 mg/dL — ABNORMAL HIGH (ref 8–23)
CO2: 27 mmol/L (ref 22–32)
Calcium: 10.3 mg/dL (ref 8.9–10.3)
Chloride: 94 mmol/L — ABNORMAL LOW (ref 98–111)
Creatinine, Ser: 2.17 mg/dL — ABNORMAL HIGH (ref 0.44–1.00)
GFR, Estimated: 22 mL/min — ABNORMAL LOW (ref >60)
Glucose, Bld: 297 mg/dL — ABNORMAL HIGH (ref 70–99)
Potassium: 4.6 mmol/L (ref 3.5–5.1)
Sodium: 133 mmol/L — ABNORMAL LOW (ref 135–145)

## 2022-05-01 LAB — POCT HEMOGLOBIN-HEMACUE: Hemoglobin: 11 g/dL — ABNORMAL LOW (ref 12.0–15.0)

## 2022-05-01 MED ORDER — DARBEPOETIN ALFA 100 MCG/0.5ML IJ SOSY
100.0000 ug | PREFILLED_SYRINGE | INTRAMUSCULAR | Status: DC
  Administered 2022-05-01: 100 ug via SUBCUTANEOUS

## 2022-05-01 MED ORDER — DARBEPOETIN ALFA 100 MCG/0.5ML IJ SOSY
PREFILLED_SYRINGE | INTRAMUSCULAR | Status: AC
  Filled 2022-05-01: qty 0.5

## 2022-05-10 ENCOUNTER — Encounter (HOSPITAL_BASED_OUTPATIENT_CLINIC_OR_DEPARTMENT_OTHER): Payer: Self-pay | Admitting: Emergency Medicine

## 2022-05-10 ENCOUNTER — Emergency Department (HOSPITAL_BASED_OUTPATIENT_CLINIC_OR_DEPARTMENT_OTHER): Payer: Medicare HMO

## 2022-05-10 ENCOUNTER — Emergency Department (HOSPITAL_BASED_OUTPATIENT_CLINIC_OR_DEPARTMENT_OTHER)
Admission: EM | Admit: 2022-05-10 | Discharge: 2022-05-10 | Disposition: A | Discharge status: Home / Self Care | Payer: Medicare HMO | Source: Home / Self Care | Attending: Emergency Medicine | Admitting: Emergency Medicine

## 2022-05-10 DIAGNOSIS — R059 Cough, unspecified: Secondary | ICD-10-CM | POA: Diagnosis not present

## 2022-05-10 DIAGNOSIS — J189 Pneumonia, unspecified organism: Secondary | ICD-10-CM | POA: Diagnosis not present

## 2022-05-10 DIAGNOSIS — B9789 Other viral agents as the cause of diseases classified elsewhere: Secondary | ICD-10-CM | POA: Diagnosis not present

## 2022-05-10 DIAGNOSIS — A413 Sepsis due to Hemophilus influenzae: Secondary | ICD-10-CM | POA: Diagnosis not present

## 2022-05-10 DIAGNOSIS — J449 Chronic obstructive pulmonary disease, unspecified: Secondary | ICD-10-CM | POA: Diagnosis not present

## 2022-05-10 DIAGNOSIS — J069 Acute upper respiratory infection, unspecified: Secondary | ICD-10-CM | POA: Insufficient documentation

## 2022-05-10 DIAGNOSIS — Z20822 Contact with and (suspected) exposure to covid-19: Secondary | ICD-10-CM | POA: Insufficient documentation

## 2022-05-10 LAB — CBC WITH DIFFERENTIAL/PLATELET
Abs Immature Granulocytes: 0.1 10*3/uL — ABNORMAL HIGH (ref 0.00–0.07)
Basophils Absolute: 0 10*3/uL (ref 0.0–0.1)
Basophils Relative: 0 %
Eosinophils Absolute: 0 10*3/uL (ref 0.0–0.5)
Eosinophils Relative: 0 %
HCT: 34 % — ABNORMAL LOW (ref 36.0–46.0)
Hemoglobin: 10.9 g/dL — ABNORMAL LOW (ref 12.0–15.0)
Immature Granulocytes: 1 %
Lymphocytes Relative: 6 %
Lymphs Abs: 0.8 10*3/uL (ref 0.7–4.0)
MCH: 29.7 pg (ref 26.0–34.0)
MCHC: 32.1 g/dL (ref 30.0–36.0)
MCV: 92.6 fL (ref 80.0–100.0)
Monocytes Absolute: 0.7 10*3/uL (ref 0.1–1.0)
Monocytes Relative: 5 %
Neutro Abs: 12.5 10*3/uL — ABNORMAL HIGH (ref 1.7–7.7)
Neutrophils Relative %: 88 %
Platelets: 206 10*3/uL (ref 150–400)
RBC: 3.67 MIL/uL — ABNORMAL LOW (ref 3.87–5.11)
RDW: 16.3 % — ABNORMAL HIGH (ref 11.5–15.5)
WBC: 14.1 10*3/uL — ABNORMAL HIGH (ref 4.0–10.5)
nRBC: 0 % (ref 0.0–0.2)

## 2022-05-10 LAB — COMPREHENSIVE METABOLIC PANEL
ALT: 9 U/L (ref 0–44)
AST: 17 U/L (ref 15–41)
Albumin: 3.1 g/dL — ABNORMAL LOW (ref 3.5–5.0)
Alkaline Phosphatase: 66 U/L (ref 38–126)
Anion gap: 10 (ref 5–15)
BUN: 35 mg/dL — ABNORMAL HIGH (ref 8–23)
CO2: 26 mmol/L (ref 22–32)
Calcium: 9.6 mg/dL (ref 8.9–10.3)
Chloride: 96 mmol/L — ABNORMAL LOW (ref 98–111)
Creatinine, Ser: 2.22 mg/dL — ABNORMAL HIGH (ref 0.44–1.00)
GFR, Estimated: 21 mL/min — ABNORMAL LOW (ref >60)
Glucose, Bld: 311 mg/dL — ABNORMAL HIGH (ref 70–99)
Potassium: 3.9 mmol/L (ref 3.5–5.1)
Sodium: 132 mmol/L — ABNORMAL LOW (ref 135–145)
Total Bilirubin: 0.8 mg/dL (ref 0.3–1.2)
Total Protein: 7.1 g/dL (ref 6.5–8.1)

## 2022-05-10 LAB — RESP PANEL BY RT-PCR (FLU A&B, COVID) ARPGX2
Influenza A by PCR: NEGATIVE
Influenza B by PCR: NEGATIVE
SARS Coronavirus 2 by RT PCR: NEGATIVE

## 2022-05-10 LAB — LIPASE, BLOOD: Lipase: 28 U/L (ref 11–51)

## 2022-05-10 MED ORDER — SODIUM CHLORIDE 0.9 % IV BOLUS
1000.0000 mL | Freq: Once | INTRAVENOUS | Status: AC
  Administered 2022-05-10: 1000 mL via INTRAVENOUS

## 2022-05-10 MED ORDER — ONDANSETRON HCL 4 MG/2ML IJ SOLN
4.0000 mg | Freq: Once | INTRAMUSCULAR | Status: AC
  Administered 2022-05-10: 4 mg via INTRAVENOUS
  Filled 2022-05-10: qty 2

## 2022-05-10 MED ORDER — BENZONATATE 100 MG PO CAPS: 100.0000 mg | ORAL_CAPSULE | Freq: Three times a day (TID) | ORAL | 0 refills | Status: DC

## 2022-05-10 MED ORDER — ONDANSETRON 4 MG PO TBDP: ORAL_TABLET | ORAL | 0 refills | Status: DC

## 2022-05-10 NOTE — ED Notes (Signed)
Daughter en route to pick up for discharge.

## 2022-05-10 NOTE — Discharge Instructions (Signed)
Take tylenol 2 pills 4 times a day.  Drink plenty of fluids.  Return for worsening shortness of breath, headache, confusion. Follow up with your family doctor.     

## 2022-05-10 NOTE — ED Triage Notes (Signed)
Cough and low grade fever since Sunday. Also endorses nausea

## 2022-05-10 NOTE — ED Provider Notes (Signed)
[facility] Provider Note   CSN: [id number] Arrival date & time: 05/10/22  1134     History  Chief Complaint  Patient presents with   Cough    Krista Mason is a 86 y.o. female.  86 yo F with a chief complaints of coughing congested nausea.  This been going on for about 4 5 days now.  Has been not eating at all because she does not feel like it.  Denies any abdominal pain denies vomiting denies diarrhea.  Not more short of breath than normal.  She felt like her home pulse ox has been lower than it typically is.   Cough      Home Medications Prior to Admission medications   Medication Sig Start Date End Date Taking? Authorizing Provider  benzonatate (TESSALON) 100 MG capsule Take 1 capsule (100 mg total) by mouth every 8 (eight) hours. 05/10/22  Yes Deno Etienne, DO  ondansetron (ZOFRAN-ODT) 4 MG disintegrating tablet '4mg'$  ODT q4 hours prn nausea/vomit 05/10/22  Yes Deno Etienne, DO  acetaminophen (TYLENOL) 500 MG tablet Take 500 mg by mouth at bedtime.    [provider]  amiodarone (PACERONE) 100 MG tablet Take 100 mg by mouth 3 (three) times a week.    [provider]  atorvastatin (LIPITOR) 20 MG tablet Take 20 mg by mouth every other day.    [provider]  carvedilol (COREG) 12.5 MG tablet TAKE ONE TABLET BY MOUTH TWICE DAILY WITH A MEAL 01/18/22   Bensimhon, Shaune Pascal, MD  cholecalciferol (VITAMIN D3) 25 MCG (1000 UNIT) tablet Take 1,000 Units by mouth every morning.    [provider]  Cyanocobalamin (VITAMIN B 12 PO) Take 1 tablet by mouth daily in the afternoon.    [provider]  diphenhydrAMINE (BENADRYL) 25 mg capsule Take 25 mg by mouth every 6 (six) hours as needed (allergies/sleep).    [provider]  Ensure (ENSURE) Take 237 mLs by mouth daily.    [provider]  HM LIDOCAINE PATCH EX Apply 5 % topically as needed.    [provider]  hydrALAZINE (APRESOLINE) 50 MG  tablet TAKE ONE TABLET BY MOUTH TWICE DAILY 10/20/21   Nahser, Wonda Cheng, MD  pantoprazole (PROTONIX) 40 MG tablet Take 1 tablet (40 mg total) by mouth in the morning and at bedtime. 04/16/22 07/15/22  [place] Donnamarie Poag, DO  Polyethyl Glycol-Propyl Glycol 0.4-0.3 % SOLN Place 1 drop into both eyes every 8 (eight) hours as needed (dryness).    [provider]  polyethylene glycol (MIRALAX / GLYCOLAX) 17 g packet Take 17 g by mouth daily as needed for mild constipation (constipation.).    [provider]  sucralfate (CARAFATE) 1 GM/10ML suspension Take 10 mLs (1 g total) by mouth 4 (four) times daily. 04/16/22 07/15/22  [place] Donnamarie Poag, DO  torsemide (DEMADEX) 20 MG tablet Please take 40 mg alternating daily with 20 mg every other day. 04/23/22   Rafael Bihari, FNP      Allergies    Epoetin alfa, Penicillins, Retacrit [epoetin (alfa)], Crestor [rosuvastatin], Hydrocodone, Influenza vac split quad, Tape, Cefaclor, Crestor [rosuvastatin calcium], Influenza vaccines, Lidocaine hcl, Nsaids, Percocet [oxycodone-acetaminophen], and Prednisone    Review of Systems   Review of Systems  Respiratory:  Positive for cough.     Physical Exam Updated Vital Signs BP 129/81   Pulse 72   Temp 99 F (37.2 C)   Resp 18   Ht '5\' 5"'$  (1.651  m)   Wt 65.3 kg   SpO2 97%   BMI 23.96 kg/m  Physical Exam Vitals and nursing note reviewed.  Constitutional:      General: She is not in acute distress.    Appearance: She is well-developed. She is not diaphoretic.  HENT:     Head: Normocephalic and atraumatic.  Eyes:     Pupils: Pupils are equal, round, and reactive to light.  Cardiovascular:     Rate and Rhythm: Normal rate and regular rhythm.     Heart sounds: No murmur heard.    No friction rub. No gallop.  Pulmonary:     Effort: Pulmonary effort is normal.     Breath sounds: No wheezing or rales.     Comments: Coarse breath sounds in all fields.  Abdominal:     General: There is no  distension.     Palpations: Abdomen is soft.     Tenderness: There is no abdominal tenderness.  Musculoskeletal:        General: No tenderness.     Cervical back: Normal range of motion and neck supple.  Skin:    General: Skin is warm and dry.  Neurological:     Mental Status: She is alert and oriented to person, place, and time.  Psychiatric:        Behavior: Behavior normal.     ED Results / Procedures / Treatments   Labs (all labs ordered are listed, but only abnormal results are displayed) Labs Reviewed  CBC WITH DIFFERENTIAL/PLATELET - Abnormal; Notable for the following components:      Result Value   WBC 14.1 (*)    RBC 3.67 (*)    Hemoglobin 10.9 (*)    HCT 34.0 (*)    RDW 16.3 (*)    Neutro Abs 12.5 (*)    Abs Immature Granulocytes 0.10 (*)    All other components within normal limits  COMPREHENSIVE METABOLIC PANEL - Abnormal; Notable for the following components:   Sodium 132 (*)    Chloride 96 (*)    Glucose, Bld 311 (*)    BUN 35 (*)    Creatinine, Ser 2.22 (*)    Albumin 3.1 (*)    GFR, Estimated 21 (*)    All other components within normal limits  RESP PANEL BY RT-PCR (FLU A&B, COVID) [id number]  LIPASE, BLOOD    EKG None  Radiology DG Chest 2 View  Result Date: 05/10/2022 CLINICAL DATA:  Productive cough x4 days EXAM: CHEST - 2 VIEW COMPARISON:  Previous studies including the examination of 04/12/2022 FINDINGS: Transverse diameter of Elnoria Howard is increased. Biventricular pacer leads are noted in place. Low position of the diaphragms suggests COPD. Calcified nodes are seen in mediastinum. There are no new infiltrates or signs of pulmonary edema. There is no pleural effusion or pneumothorax. IMPRESSION: Cardiomegaly. COPD. There are no new infiltrates or signs of pulmonary edema. Electronically Signed   By: Elmer Picker M.D.   On: 05/10/2022 12:19    Procedures Procedures    Medications Ordered in ED Medications  sodium chloride 0.9 % bolus 1,000 mL  (0 mLs Intravenous Stopped 05/10/22 1350)  ondansetron (ZOFRAN) injection 4 mg (4 mg Intravenous Given 05/10/22 1300)    ED Course/ Medical Decision Making/ A&P                           Medical Decision Making Amount and/or Complexity of Data Reviewed Labs: ordered. Radiology: ordered.  Risk Prescription drug management.   86 yo F with a cc of cough, fatigue, decreased oral intake. Bolus of iv fluids, labs.    Chest x-ray independently interpreted by me without focal infiltrate.  Patient is feeling a bit better on reassessment.  Blood work without change from baseline.  CKD, mild leukocytosis.  Mild hyperglycemia.  LFTs and lipase are unremarkable.  I discussed results with the patient.  We will treat as a viral syndrome.  Have her follow-up with her family doctor.  1:56 PM:  I have discussed the diagnosis/risks/treatment options with the patient.  Evaluation and diagnostic testing in the emergency department does not suggest an emergent condition requiring admission or immediate intervention beyond what has been performed at this time.  They will follow up with  PCP. We also discussed returning to the ED immediately if new or worsening sx occur. We discussed the sx which are most concerning (e.g., sudden worsening pain, fever, inability to tolerate by mouth) that necessitate immediate return. Medications administered to the patient during their visit and any new prescriptions provided to the patient are listed below.  Medications given during this visit Medications  sodium chloride 0.9 % bolus 1,000 mL (0 mLs Intravenous Stopped 05/10/22 1350)  ondansetron (ZOFRAN) injection 4 mg (4 mg Intravenous Given 05/10/22 1300)     The patient appears reasonably screen and/or stabilized for discharge and I doubt any other medical condition or other [facility] requiring further screening, evaluation, or treatment in the ED at this time prior to discharge.            Final Clinical Impression(s)  / ED Diagnoses Final diagnoses:  Viral URI with cough    Rx / DC Orders ED Discharge Orders          Ordered    benzonatate (TESSALON) 100 MG capsule  Every 8 hours        05/10/22 1334    ondansetron (ZOFRAN-ODT) 4 MG disintegrating tablet        05/10/22 [place]pit, Bowie, DO 05/10/22 1356

## 2022-05-13 ENCOUNTER — Emergency Department (HOSPITAL_COMMUNITY): Payer: Medicare HMO

## 2022-05-13 ENCOUNTER — Encounter (HOSPITAL_COMMUNITY): Payer: Self-pay

## 2022-05-13 ENCOUNTER — Inpatient Hospital Stay (HOSPITAL_COMMUNITY)
Admission: EM | Admit: 2022-05-13 | Discharge: 2022-05-21 | DRG: 871 | Disposition: A | Discharge status: Hospice | Payer: Medicare HMO | Attending: Internal Medicine | Admitting: Internal Medicine

## 2022-05-13 ENCOUNTER — Other Ambulatory Visit: Payer: Self-pay

## 2022-05-13 DIAGNOSIS — I428 Other cardiomyopathies: Secondary | ICD-10-CM | POA: Diagnosis not present

## 2022-05-13 DIAGNOSIS — E1165 Type 2 diabetes mellitus with hyperglycemia: Secondary | ICD-10-CM | POA: Diagnosis not present

## 2022-05-13 DIAGNOSIS — N179 Acute kidney failure, unspecified: Secondary | ICD-10-CM | POA: Diagnosis present

## 2022-05-13 DIAGNOSIS — I081 Rheumatic disorders of both mitral and tricuspid valves: Secondary | ICD-10-CM | POA: Diagnosis present

## 2022-05-13 DIAGNOSIS — I447 Left bundle-branch block, unspecified: Secondary | ICD-10-CM | POA: Diagnosis present

## 2022-05-13 DIAGNOSIS — I5041 Acute combined systolic (congestive) and diastolic (congestive) heart failure: Secondary | ICD-10-CM | POA: Diagnosis not present

## 2022-05-13 DIAGNOSIS — Z888 Allergy status to other drugs, medicaments and biological substances status: Secondary | ICD-10-CM

## 2022-05-13 DIAGNOSIS — R6520 Severe sepsis without septic shock: Secondary | ICD-10-CM | POA: Diagnosis present

## 2022-05-13 DIAGNOSIS — Z885 Allergy status to narcotic agent status: Secondary | ICD-10-CM

## 2022-05-13 DIAGNOSIS — R638 Other symptoms and signs concerning food and fluid intake: Secondary | ICD-10-CM | POA: Diagnosis not present

## 2022-05-13 DIAGNOSIS — Z515 Encounter for palliative care: Secondary | ICD-10-CM | POA: Diagnosis not present

## 2022-05-13 DIAGNOSIS — E1122 Type 2 diabetes mellitus with diabetic chronic kidney disease: Secondary | ICD-10-CM | POA: Diagnosis present

## 2022-05-13 DIAGNOSIS — R0602 Shortness of breath: Secondary | ICD-10-CM | POA: Diagnosis not present

## 2022-05-13 DIAGNOSIS — K219 Gastro-esophageal reflux disease without esophagitis: Secondary | ICD-10-CM | POA: Diagnosis present

## 2022-05-13 DIAGNOSIS — R778 Other specified abnormalities of plasma proteins: Secondary | ICD-10-CM | POA: Diagnosis present

## 2022-05-13 DIAGNOSIS — E785 Hyperlipidemia, unspecified: Secondary | ICD-10-CM | POA: Diagnosis not present

## 2022-05-13 DIAGNOSIS — R918 Other nonspecific abnormal finding of lung field: Secondary | ICD-10-CM | POA: Diagnosis not present

## 2022-05-13 DIAGNOSIS — I517 Cardiomegaly: Secondary | ICD-10-CM | POA: Diagnosis not present

## 2022-05-13 DIAGNOSIS — I10 Essential (primary) hypertension: Secondary | ICD-10-CM | POA: Diagnosis present

## 2022-05-13 DIAGNOSIS — D509 Iron deficiency anemia, unspecified: Secondary | ICD-10-CM | POA: Diagnosis not present

## 2022-05-13 DIAGNOSIS — N184 Chronic kidney disease, stage 4 (severe): Secondary | ICD-10-CM | POA: Diagnosis not present

## 2022-05-13 DIAGNOSIS — Z950 Presence of cardiac pacemaker: Secondary | ICD-10-CM

## 2022-05-13 DIAGNOSIS — Z884 Allergy status to anesthetic agent status: Secondary | ICD-10-CM

## 2022-05-13 DIAGNOSIS — A413 Sepsis due to Hemophilus influenzae: Principal | ICD-10-CM | POA: Diagnosis present

## 2022-05-13 DIAGNOSIS — Z8719 Personal history of other diseases of the digestive system: Secondary | ICD-10-CM

## 2022-05-13 DIAGNOSIS — Z7189 Other specified counseling: Secondary | ICD-10-CM | POA: Diagnosis not present

## 2022-05-13 DIAGNOSIS — I898 Other specified noninfective disorders of lymphatic vessels and lymph nodes: Secondary | ICD-10-CM | POA: Diagnosis not present

## 2022-05-13 DIAGNOSIS — Y95 Nosocomial condition: Secondary | ICD-10-CM | POA: Diagnosis present

## 2022-05-13 DIAGNOSIS — J439 Emphysema, unspecified: Secondary | ICD-10-CM | POA: Diagnosis present

## 2022-05-13 DIAGNOSIS — I130 Hypertensive heart and chronic kidney disease with heart failure and stage 1 through stage 4 chronic kidney disease, or unspecified chronic kidney disease: Secondary | ICD-10-CM | POA: Diagnosis present

## 2022-05-13 DIAGNOSIS — I2781 Cor pulmonale (chronic): Secondary | ICD-10-CM | POA: Diagnosis present

## 2022-05-13 DIAGNOSIS — Z87891 Personal history of nicotine dependence: Secondary | ICD-10-CM

## 2022-05-13 DIAGNOSIS — E782 Mixed hyperlipidemia: Secondary | ICD-10-CM | POA: Diagnosis not present

## 2022-05-13 DIAGNOSIS — Z743 Need for continuous supervision: Secondary | ICD-10-CM | POA: Diagnosis not present

## 2022-05-13 DIAGNOSIS — Z823 Family history of stroke: Secondary | ICD-10-CM

## 2022-05-13 DIAGNOSIS — Z66 Do not resuscitate: Secondary | ICD-10-CM | POA: Diagnosis not present

## 2022-05-13 DIAGNOSIS — D631 Anemia in chronic kidney disease: Secondary | ICD-10-CM | POA: Diagnosis not present

## 2022-05-13 DIAGNOSIS — Z20822 Contact with and (suspected) exposure to covid-19: Secondary | ICD-10-CM | POA: Diagnosis not present

## 2022-05-13 DIAGNOSIS — J189 Pneumonia, unspecified organism: Secondary | ICD-10-CM | POA: Diagnosis present

## 2022-05-13 DIAGNOSIS — I5043 Acute on chronic combined systolic (congestive) and diastolic (congestive) heart failure: Secondary | ICD-10-CM | POA: Diagnosis present

## 2022-05-13 DIAGNOSIS — N170 Acute kidney failure with tubular necrosis: Secondary | ICD-10-CM | POA: Diagnosis not present

## 2022-05-13 DIAGNOSIS — R0902 Hypoxemia: Secondary | ICD-10-CM | POA: Diagnosis not present

## 2022-05-13 DIAGNOSIS — E1169 Type 2 diabetes mellitus with other specified complication: Secondary | ICD-10-CM | POA: Diagnosis not present

## 2022-05-13 DIAGNOSIS — Z833 Family history of diabetes mellitus: Secondary | ICD-10-CM

## 2022-05-13 DIAGNOSIS — R0689 Other abnormalities of breathing: Secondary | ICD-10-CM | POA: Diagnosis not present

## 2022-05-13 DIAGNOSIS — J9601 Acute respiratory failure with hypoxia: Secondary | ICD-10-CM | POA: Diagnosis present

## 2022-05-13 DIAGNOSIS — T380X5A Adverse effect of glucocorticoids and synthetic analogues, initial encounter: Secondary | ICD-10-CM | POA: Diagnosis not present

## 2022-05-13 DIAGNOSIS — Z8249 Family history of ischemic heart disease and other diseases of the circulatory system: Secondary | ICD-10-CM

## 2022-05-13 DIAGNOSIS — A419 Sepsis, unspecified organism: Secondary | ICD-10-CM

## 2022-05-13 DIAGNOSIS — D500 Iron deficiency anemia secondary to blood loss (chronic): Secondary | ICD-10-CM | POA: Diagnosis present

## 2022-05-13 DIAGNOSIS — Z880 Allergy status to penicillin: Secondary | ICD-10-CM

## 2022-05-13 DIAGNOSIS — I480 Paroxysmal atrial fibrillation: Secondary | ICD-10-CM | POA: Diagnosis present

## 2022-05-13 DIAGNOSIS — Z7901 Long term (current) use of anticoagulants: Secondary | ICD-10-CM

## 2022-05-13 DIAGNOSIS — Z79899 Other long term (current) drug therapy: Secondary | ICD-10-CM

## 2022-05-13 DIAGNOSIS — Z86010 Personal history of colonic polyps: Secondary | ICD-10-CM

## 2022-05-13 DIAGNOSIS — I110 Hypertensive heart disease with heart failure: Secondary | ICD-10-CM | POA: Diagnosis not present

## 2022-05-13 DIAGNOSIS — K5900 Constipation, unspecified: Secondary | ICD-10-CM | POA: Diagnosis not present

## 2022-05-13 DIAGNOSIS — R1900 Intra-abdominal and pelvic swelling, mass and lump, unspecified site: Secondary | ICD-10-CM | POA: Diagnosis not present

## 2022-05-13 DIAGNOSIS — Z887 Allergy status to serum and vaccine status: Secondary | ICD-10-CM

## 2022-05-13 DIAGNOSIS — E118 Type 2 diabetes mellitus with unspecified complications: Secondary | ICD-10-CM | POA: Diagnosis present

## 2022-05-13 DIAGNOSIS — J80 Acute respiratory distress syndrome: Secondary | ICD-10-CM | POA: Diagnosis not present

## 2022-05-13 LAB — I-STAT VENOUS BLOOD GAS, ED
Acid-Base Excess: 1 mmol/L (ref 0.0–2.0)
Bicarbonate: 26.9 mmol/L (ref 20.0–28.0)
Calcium, Ion: 1.23 mmol/L (ref 1.15–1.40)
HCT: 32 % — ABNORMAL LOW (ref 36.0–46.0)
Hemoglobin: 10.9 g/dL — ABNORMAL LOW (ref 12.0–15.0)
O2 Saturation: 91 %
Potassium: 4.1 mmol/L (ref 3.5–5.1)
Sodium: 133 mmol/L — ABNORMAL LOW (ref 135–145)
TCO2: 28 mmol/L (ref 22–32)
pCO2, Ven: 46.3 mmHg (ref 44–60)
pH, Ven: 7.373 (ref 7.25–7.43)
pO2, Ven: 63 mmHg — ABNORMAL HIGH (ref 32–45)

## 2022-05-13 LAB — COMPREHENSIVE METABOLIC PANEL
ALT: 12 U/L (ref 0–44)
AST: 20 U/L (ref 15–41)
Albumin: 2.5 g/dL — ABNORMAL LOW (ref 3.5–5.0)
Alkaline Phosphatase: 75 U/L (ref 38–126)
Anion gap: 11 (ref 5–15)
BUN: 42 mg/dL — ABNORMAL HIGH (ref 8–23)
CO2: 25 mmol/L (ref 22–32)
Calcium: 9.4 mg/dL (ref 8.9–10.3)
Chloride: 96 mmol/L — ABNORMAL LOW (ref 98–111)
Creatinine, Ser: 2.65 mg/dL — ABNORMAL HIGH (ref 0.44–1.00)
GFR, Estimated: 17 mL/min — ABNORMAL LOW (ref >60)
Glucose, Bld: 293 mg/dL — ABNORMAL HIGH (ref 70–99)
Potassium: 4.2 mmol/L (ref 3.5–5.1)
Sodium: 132 mmol/L — ABNORMAL LOW (ref 135–145)
Total Bilirubin: 0.8 mg/dL (ref 0.3–1.2)
Total Protein: 6.6 g/dL (ref 6.5–8.1)

## 2022-05-13 LAB — PROTIME-INR
INR: 1.2 (ref 0.8–1.2)
Prothrombin Time: 15.3 seconds — ABNORMAL HIGH (ref 11.4–15.2)

## 2022-05-13 LAB — TROPONIN I (HIGH SENSITIVITY)
Troponin I (High Sensitivity): 48 ng/L — ABNORMAL HIGH (ref <18)
Troponin I (High Sensitivity): 34 ng/L — ABNORMAL HIGH (ref <18)

## 2022-05-13 LAB — CBG MONITORING, ED: Glucose-Capillary: 289 mg/dL — ABNORMAL HIGH (ref 70–99)

## 2022-05-13 LAB — CBC WITH DIFFERENTIAL/PLATELET
Abs Immature Granulocytes: 0.1 10*3/uL — ABNORMAL HIGH (ref 0.00–0.07)
Basophils Absolute: 0.1 10*3/uL (ref 0.0–0.1)
Basophils Relative: 0 %
Eosinophils Absolute: 0 10*3/uL (ref 0.0–0.5)
Eosinophils Relative: 0 %
HCT: 32.8 % — ABNORMAL LOW (ref 36.0–46.0)
Hemoglobin: 10.2 g/dL — ABNORMAL LOW (ref 12.0–15.0)
Immature Granulocytes: 1 %
Lymphocytes Relative: 4 %
Lymphs Abs: 0.6 10*3/uL — ABNORMAL LOW (ref 0.7–4.0)
MCH: 29.7 pg (ref 26.0–34.0)
MCHC: 31.1 g/dL (ref 30.0–36.0)
MCV: 95.3 fL (ref 80.0–100.0)
Monocytes Absolute: 0.9 10*3/uL (ref 0.1–1.0)
Monocytes Relative: 6 %
Neutro Abs: 13.6 10*3/uL — ABNORMAL HIGH (ref 1.7–7.7)
Neutrophils Relative %: 89 %
Platelets: 222 10*3/uL (ref 150–400)
RBC: 3.44 MIL/uL — ABNORMAL LOW (ref 3.87–5.11)
RDW: 16.5 % — ABNORMAL HIGH (ref 11.5–15.5)
WBC: 15.3 10*3/uL — ABNORMAL HIGH (ref 4.0–10.5)
nRBC: 0 % (ref 0.0–0.2)

## 2022-05-13 LAB — URINALYSIS, ROUTINE W REFLEX MICROSCOPIC
Bilirubin Urine: NEGATIVE
Glucose, UA: NEGATIVE mg/dL
Hgb urine dipstick: NEGATIVE
Ketones, ur: NEGATIVE mg/dL
Leukocytes,Ua: NEGATIVE
Nitrite: NEGATIVE
Protein, ur: NEGATIVE mg/dL
Specific Gravity, Urine: 1.012 (ref 1.005–1.030)
pH: 5 (ref 5.0–8.0)

## 2022-05-13 LAB — BRAIN NATRIURETIC PEPTIDE: B Natriuretic Peptide: 1351.3 pg/mL — ABNORMAL HIGH (ref 0.0–100.0)

## 2022-05-13 LAB — LACTIC ACID, PLASMA
Lactic Acid, Venous: 2.1 mmol/L (ref 0.5–1.9)
Lactic Acid, Venous: 1.7 mmol/L (ref 0.5–1.9)

## 2022-05-13 LAB — PROCALCITONIN: Procalcitonin: 3.89 ng/mL

## 2022-05-13 LAB — STREP PNEUMONIAE URINARY ANTIGEN: Strep Pneumo Urinary Antigen: NEGATIVE

## 2022-05-13 LAB — APTT: aPTT: 32 seconds (ref 24–36)

## 2022-05-13 LAB — D-DIMER, QUANTITATIVE: D-Dimer, Quant: 2.95 ug/mL-FEU — ABNORMAL HIGH (ref 0.00–0.50)

## 2022-05-13 LAB — MAGNESIUM: Magnesium: 2.1 mg/dL (ref 1.7–2.4)

## 2022-05-13 MED ORDER — VANCOMYCIN HCL 750 MG/150ML IV SOLN: 750.0000 mg | INTRAVENOUS | Status: DC

## 2022-05-13 MED ORDER — SODIUM CHLORIDE 0.9 % IV SOLN
2.0000 g | Freq: Once | INTRAVENOUS | Status: AC
  Administered 2022-05-13: 2 g via INTRAVENOUS
  Filled 2022-05-13: qty 12.5

## 2022-05-13 MED ORDER — SUCRALFATE 1 GM/10ML PO SUSP
1.0000 g | Freq: Four times a day (QID) | ORAL | Status: DC
  Administered 2022-05-14 – 2022-05-21 (×27): 1 g via ORAL
  Filled 2022-05-13 (×28): qty 10

## 2022-05-13 MED ORDER — ONDANSETRON HCL 4 MG/2ML IJ SOLN
4.0000 mg | Freq: Four times a day (QID) | INTRAMUSCULAR | Status: DC | PRN
  Administered 2022-05-16 – 2022-05-19 (×3): 4 mg via INTRAVENOUS
  Filled 2022-05-13 (×4): qty 2

## 2022-05-13 MED ORDER — LEVALBUTEROL HCL 0.63 MG/3ML IN NEBU: 0.6300 mg | INHALATION_SOLUTION | Freq: Four times a day (QID) | RESPIRATORY_TRACT | Status: DC | PRN

## 2022-05-13 MED ORDER — POLYVINYL ALCOHOL 1.4 % OP SOLN: 1.0000 [drp] | Freq: Three times a day (TID) | OPHTHALMIC | Status: DC | PRN

## 2022-05-13 MED ORDER — PANTOPRAZOLE SODIUM 40 MG IV SOLR
40.0000 mg | Freq: Two times a day (BID) | INTRAVENOUS | Status: DC
  Administered 2022-05-13: 40 mg via INTRAVENOUS
  Filled 2022-05-13: qty 10

## 2022-05-13 MED ORDER — AMIODARONE HCL 200 MG PO TABS
100.0000 mg | ORAL_TABLET | ORAL | Status: DC
  Administered 2022-05-14 – 2022-05-18 (×3): 100 mg via ORAL
  Filled 2022-05-13 (×4): qty 1

## 2022-05-13 MED ORDER — FUROSEMIDE 10 MG/ML IJ SOLN
40.0000 mg | Freq: Once | INTRAMUSCULAR | Status: AC
  Administered 2022-05-13: 40 mg via INTRAVENOUS
  Filled 2022-05-13: qty 4

## 2022-05-13 MED ORDER — ONDANSETRON HCL 4 MG PO TABS: 4.0000 mg | ORAL_TABLET | Freq: Four times a day (QID) | ORAL | Status: DC | PRN

## 2022-05-13 MED ORDER — SODIUM CHLORIDE 0.9 % IV SOLN: 250.0000 mL | INTRAVENOUS | Status: DC | PRN

## 2022-05-13 MED ORDER — SODIUM CHLORIDE 0.9 % IV SOLN
2.0000 g | INTRAVENOUS | Status: DC
  Administered 2022-05-14: 2 g via INTRAVENOUS
  Filled 2022-05-13: qty 12.5

## 2022-05-13 MED ORDER — SODIUM CHLORIDE 0.9% FLUSH: 3.0000 mL | Freq: Two times a day (BID) | INTRAVENOUS | Status: DC

## 2022-05-13 MED ORDER — GUAIFENESIN ER 600 MG PO TB12
600.0000 mg | ORAL_TABLET | Freq: Two times a day (BID) | ORAL | Status: DC
  Administered 2022-05-13 – 2022-05-18 (×10): 600 mg via ORAL
  Filled 2022-05-13 (×10): qty 1

## 2022-05-13 MED ORDER — ONDANSETRON HCL 4 MG/2ML IJ SOLN
4.0000 mg | Freq: Once | INTRAMUSCULAR | Status: AC
  Administered 2022-05-13: 4 mg via INTRAVENOUS
  Filled 2022-05-13: qty 2

## 2022-05-13 MED ORDER — HYDRALAZINE HCL 20 MG/ML IJ SOLN
5.0000 mg | Freq: Three times a day (TID) | INTRAMUSCULAR | Status: DC | PRN
Start: 2022-05-13 — End: 2022-05-14

## 2022-05-13 MED ORDER — VANCOMYCIN HCL IN DEXTROSE 1-5 GM/200ML-% IV SOLN
1000.0000 mg | Freq: Once | INTRAVENOUS | Status: AC
  Administered 2022-05-13: 1000 mg via INTRAVENOUS
  Filled 2022-05-13: qty 200

## 2022-05-13 MED ORDER — SODIUM CHLORIDE 0.9% FLUSH: 3.0000 mL | INTRAVENOUS | Status: DC | PRN

## 2022-05-13 MED ORDER — INSULIN ASPART 100 UNIT/ML IJ SOLN
0.0000 [IU] | Freq: Three times a day (TID) | INTRAMUSCULAR | Status: DC
  Administered 2022-05-14 (×2): 3 [IU] via SUBCUTANEOUS
  Administered 2022-05-14: 2 [IU] via SUBCUTANEOUS
  Administered 2022-05-15: 4 [IU] via SUBCUTANEOUS
  Administered 2022-05-15 (×2): 3 [IU] via SUBCUTANEOUS
  Administered 2022-05-16: 2 [IU] via SUBCUTANEOUS
  Administered 2022-05-16 (×2): 3 [IU] via SUBCUTANEOUS
  Administered 2022-05-17: 1 [IU] via SUBCUTANEOUS
  Administered 2022-05-17 (×2): 2 [IU] via SUBCUTANEOUS
  Administered 2022-05-18: 4 [IU] via SUBCUTANEOUS
  Administered 2022-05-18: 2 [IU] via SUBCUTANEOUS
  Administered 2022-05-18: 4 [IU] via SUBCUTANEOUS
  Administered 2022-05-19: 1 [IU] via SUBCUTANEOUS

## 2022-05-13 MED ORDER — ACETAMINOPHEN 650 MG RE SUPP: 650.0000 mg | Freq: Four times a day (QID) | RECTAL | Status: DC | PRN

## 2022-05-13 MED ORDER — GUAIFENESIN-DM 100-10 MG/5ML PO SYRP
5.0000 mL | ORAL_SOLUTION | ORAL | Status: DC | PRN
  Administered 2022-05-14 – 2022-05-19 (×8): 5 mL via ORAL
  Filled 2022-05-13 (×8): qty 5

## 2022-05-13 MED ORDER — METOPROLOL TARTRATE 5 MG/5ML IV SOLN
2.5000 mg | Freq: Once | INTRAVENOUS | Status: AC
  Administered 2022-05-13: 2.5 mg via INTRAVENOUS
  Filled 2022-05-13: qty 5

## 2022-05-13 MED ORDER — FUROSEMIDE 10 MG/ML IJ SOLN: 80.0000 mg | Freq: Once | INTRAMUSCULAR | Status: DC

## 2022-05-13 MED ORDER — ACETAMINOPHEN 325 MG PO TABS
650.0000 mg | ORAL_TABLET | Freq: Four times a day (QID) | ORAL | Status: DC | PRN
  Administered 2022-05-15 – 2022-05-16 (×2): 650 mg via ORAL
  Filled 2022-05-13 (×2): qty 2

## 2022-05-13 MED ORDER — HEPARIN SODIUM (PORCINE) 5000 UNIT/ML IJ SOLN
5000.0000 [IU] | Freq: Three times a day (TID) | INTRAMUSCULAR | Status: DC
  Administered 2022-05-13 – 2022-05-19 (×17): 5000 [IU] via SUBCUTANEOUS
  Filled 2022-05-13 (×18): qty 1

## 2022-05-13 NOTE — H&P (Addendum)
History and Physical    PatientMarland Kitchen EVANGELINA DELANCEY Mason:[id number] DOB: [birth year] DOA: 05/13/2022 DOS: the patient was seen and examined on 05/13/2022 PCP: Lawerance Cruel, MD  Patient coming from:  [facility] independent living  uses walker    Chief Complaint: shortness of breath/cough  HPI: Krista Mason is a 86 y.o. female with medical history significant of    anemia; afib on Eliquis; chronic combined CHF; DM; HTN, CKD stage IV, and HLD presenting with shortness of breath x 1 week that has progressively gotten worse. Found to be 74% on RA with EMS and was placed on cpap. She states her shortness of breath has progressed and yesterday it was severe and she was working to breath. She has a congested cough, but no production. She has had mild fever to 100.2 for the past week. She has had nausea and some post tussive emesis.   She feels like her abdomen is swollen, not her legs. Weight today 143 pounds, interesting her discharge weight in June was 149 pounds.   Denies any vision changes/headaches, chest pain, but does endorse palpitations, abdominal pain, diarrhea, dysuria or leg swelling. She has had poor po intake over the past week.    Admitted on 04/12/22 for GI bleed/symptomatic anemia. Hx of esophagitis and esophageal ulcer in the past. Received 1 unit PRBC. Hgb at time of d/c was 9.3. has outpatient EGD scheduled for 8/1. Eliquis held and to resume after 2 weeks after meeting with cardiology. Cardiology decided to keep off eliquis with hx of recurrent GI bleeds.   She does not smoke or drink alcohol.   ER Course:  vitals: afebrile, bp: 108/89 HR: 98,  RR: 23, oxygen: 97%  Pertinent labs: wbc: 15.3, hgb: 10.2, sodium: 132, BUN: 42, creatinine: 2.65, troponin 48>   BNP: 1351, lactic acid: 2.1,  CXR: very extensive and new heterogeneous and consolidation airspace opacity of the RUL, concerning for infection.  In ED: started on cefepime and vanc and given '40mg'$  of lasix. Started on bipap then  placed on HFNC. TRH asked to admit.     Review of Systems: As mentioned in the history of present illness. All other systems reviewed and are negative. Past Medical History:  Diagnosis Date   Anemia    Atrial fibrillation (HCC)    Chronic combined systolic and diastolic CHF (congestive heart failure) ([place])    a. LV dysfcuntion felt to be related to PVCs   Chronic kidney disease, stage 4 (severe) (HCC)    Diabetes mellitus without complication (HCC)    GERD (gastroesophageal reflux disease)    Hx of echocardiogram    Echo (1/16):  EF 60-65%, no RWMA, Gr 1 DD, mod AI, mod MR, mild LAE   Hyperkalemia    Hyperlipidemia    Hypertension    NICM (nonischemic cardiomyopathy) ([place])    a. 12/2018 Echo: EF 35%, diff HK w/ septal-lateral dyssynchrony. Nl RV fxn; b. 07/2019 s/p MDT [id number] [name] CRT-P MRI SureScan device (ser #: [id number])   Orthostatic hypotension    PVC (premature ventricular contraction)    a. s/p PVC ablation in 11/2016   Rheumatic fever    Wears dentures    Past Surgical History:  Procedure Laterality Date   BALLOON DILATION N/A 07/12/2020   Procedure: BALLOON DILATION;  Surgeon: Ronald Lobo, MD;  Location: [facility];  Service: Endoscopy;  Laterality: N/A;   BALLOON DILATION N/A 03/20/2022   Procedure: BALLOON DILATION;  Surgeon: Otis Brace, MD;  [facility]: [facility];  Service: Gastroenterology;  Laterality: N/A;   BIOPSY  08/20/2019   Procedure: BIOPSY;  Surgeon: Otis Brace, MD;  Location: [place] [facility];  Service: Gastroenterology;;   BIV PACEMAKER INSERTION CRT-P N/A 07/24/2019   Procedure: BIV PACEMAKER INSERTION CRT-P;  Surgeon: Constance Haw, MD;  Location: [place] [facility];  Service: Cardiovascular;  Laterality: N/A;   COLONOSCOPY WITH PROPOFOL N/A 08/22/2019   Procedure: COLONOSCOPY WITH PROPOFOL;  Surgeon: Laurence Spates, MD;  Location: [place];  Service: Endoscopy;  Laterality: N/A;   ESOPHAGOGASTRODUODENOSCOPY N/A  02/28/2021   Procedure: ESOPHAGOGASTRODUODENOSCOPY (EGD);  Surgeon: Wilford Corner, MD;  Location: [place];  Service: Endoscopy;  Laterality: N/A;   ESOPHAGOGASTRODUODENOSCOPY (EGD) WITH PROPOFOL N/A 09/25/2018   Procedure: ESOPHAGOGASTRODUODENOSCOPY (EGD) WITH PROPOFOL;  Surgeon: Ronald Lobo, MD;  [facility];  Service: Endoscopy;  Laterality: N/A;   ESOPHAGOGASTRODUODENOSCOPY (EGD) WITH PROPOFOL N/A 10/29/2018   Procedure: ESOPHAGOGASTRODUODENOSCOPY (EGD) WITH PROPOFOL;  Surgeon: Ronald Lobo, MD;  [facility];  Service: Endoscopy;  Laterality: N/A;   ESOPHAGOGASTRODUODENOSCOPY (EGD) WITH PROPOFOL N/A 08/20/2019   Procedure: ESOPHAGOGASTRODUODENOSCOPY (EGD) WITH PROPOFOL;  Surgeon: Otis Brace, MD;  Location: [place];  Service: Gastroenterology;  Laterality: N/A;   ESOPHAGOGASTRODUODENOSCOPY (EGD) WITH PROPOFOL N/A 05/03/2020   Procedure: ESOPHAGOGASTRODUODENOSCOPY (EGD) WITH PROPOFOL;  Surgeon: Ronald Lobo, MD;  [facility];  Service: Endoscopy;  Laterality: N/A;   ESOPHAGOGASTRODUODENOSCOPY (EGD) WITH PROPOFOL N/A 06/02/2020   Procedure: ESOPHAGOGASTRODUODENOSCOPY (EGD) WITH PROPOFOL and Savory Dilatation;  Surgeon: Clarene Essex, MD;  [facility];  Service: Gastroenterology;  Laterality: N/A;   ESOPHAGOGASTRODUODENOSCOPY (EGD) WITH PROPOFOL N/A 07/12/2020   Procedure: ESOPHAGOGASTRODUODENOSCOPY (EGD) WITH PROPOFOL with dilatation;  Surgeon: Ronald Lobo, MD;  [facility];  Service: Endoscopy;  Laterality: N/A;   ESOPHAGOGASTRODUODENOSCOPY (EGD) WITH PROPOFOL N/A 05/31/2021   Procedure: ESOPHAGOGASTRODUODENOSCOPY (EGD) WITH PROPOFOL;  Surgeon: Otis Brace, MD;  [facility];  Service: Gastroenterology;  Laterality: N/A;   ESOPHAGOGASTRODUODENOSCOPY (EGD) WITH PROPOFOL N/A 10/10/2021   Procedure: ESOPHAGOGASTRODUODENOSCOPY (EGD) WITH PROPOFOL;  Surgeon: Otis Brace, MD;  [facility];   Service: Gastroenterology;  Laterality: N/A;   ESOPHAGOGASTRODUODENOSCOPY (EGD) WITH PROPOFOL N/A 12/28/2021   Procedure: ESOPHAGOGASTRODUODENOSCOPY (EGD) WITH PROPOFOL;  Surgeon: [name], MD;  Location: [place];  Service: Gastroenterology;  Laterality: N/A;   ESOPHAGOGASTRODUODENOSCOPY (EGD) WITH PROPOFOL N/A 03/20/2022   Procedure: ESOPHAGOGASTRODUODENOSCOPY (EGD) WITH PROPOFOL;  Surgeon: Otis Brace, MD;  [facility];  Service: Gastroenterology;  Laterality: N/A;   HEMOSTASIS CLIP PLACEMENT  08/22/2019   Procedure: HEMOSTASIS CLIP PLACEMENT;  Surgeon: Laurence Spates, MD;  Location: [facility];  Service: Endoscopy;;   KNEE SURGERY Left 2009   OVARIAN CYST REMOVAL     POLYPECTOMY  08/22/2019   Procedure: POLYPECTOMY;  Surgeon: Laurence Spates, MD;  Location: [place];  Service: Endoscopy;;   POLYPECTOMY  05/31/2021   Procedure: POLYPECTOMY;  Surgeon: Otis Brace, MD;  [facility];  Service: Gastroenterology;;   PVC ABLATION N/A 11/30/2016   Procedure: PVC Ablation;  Surgeon: Will [name], MD;  Location: [place] [facility];  Service: Cardiovascular;  Laterality: N/A;   RIGHT HEART CATH N/A 01/31/2018   Procedure: RIGHT HEART CATH;  Surgeon: Jolaine Artist, MD;  Location: [place] [facility];  Service: Cardiovascular;  Laterality: N/A;   RIGHT/LEFT HEART CATH AND CORONARY ANGIOGRAPHY N/A 01/25/2017   Procedure: Right/Left Heart Cath and Coronary Angiography;  Surgeon: Jolaine Artist, MD;  Location: [place] [facility];  Service: Cardiovascular;  Laterality: N/A;   SAVORY DILATION  N/A 09/25/2018   Procedure: SAVORY DILATION;  Surgeon: Ronald Lobo, MD;  [facility];  Service: Endoscopy;  Laterality: N/A;   SAVORY DILATION N/A 10/29/2018   Procedure: SAVORY DILATION;  Surgeon: Ronald Lobo, MD;  [facility];  Service: Endoscopy;  Laterality: N/A;   SAVORY DILATION N/A 08/20/2019   Procedure: SAVORY DILATION;  Surgeon:  Otis Brace, MD;  Location: [facility];  Service: Gastroenterology;  Laterality: N/A;   SAVORY DILATION N/A 05/03/2020   Procedure: SAVORY DILATION;  Surgeon: Ronald Lobo, MD;  [facility];  Service: Endoscopy;  Laterality: N/A;  Patient needs ultraslim pediatric upper endoscope/     no fluoro per dr. Cristal Deer DILATION N/A 06/02/2020   Procedure: SAVORY DILATION w/ Burtis Junes;  Surgeon: Clarene Essex, MD;  [facility];  Service: Gastroenterology;  Laterality: N/A;   Social History:  reports that she quit smoking about 5 years ago. Her smoking use included cigarettes. She has a 45.00 pack-year smoking history. She has never used smokeless tobacco. She reports that she does not drink alcohol and does not use drugs.  Allergies  Allergen Reactions   Penicillins Itching and Rash    Amoxicillin ok- IM pen is what gives reaction  Did it involve swelling of the face/tongue/throat, SOB, or low BP? No Did it involve sudden or severe rash/hives, skin peeling, or any reaction on the inside of your mouth or nose? No Did you need to seek medical attention at a hospital or doctor's office? No When did it last happen?      10 + year If all above answers are "NO", may proceed with cephalosporin use.  Other reaction(s): rash, itching   Retacrit [Epoetin (Alfa)] Other (See Comments)    Chills, low grade fever, body aches, fatigue, nausea for 3 days.   Crestor [Rosuvastatin] Nausea Only   Hydrocodone Itching and Other (See Comments)    And the patient "got mean"   Influenza Vac Split Quad Swelling and Other (See Comments)    Local swelling and fever   Tape Other (See Comments)    The skin tears easily   Cefaclor Itching   Crestor [Rosuvastatin Calcium] Nausea Only   Influenza Vaccines Swelling and Other (See Comments)    Swelling and redness at injection site, fever   Lidocaine Hcl Palpitations and Other (See Comments)    Heart racing if Lidocaine is with EPI    Nsaids Other (See Comments)    Pt states she is not taking because of kidney function    Percocet [Oxycodone-Acetaminophen] Nausea And Vomiting   Prednisone Other (See Comments)    Dizziness, spiked blood sugar     Family History  Problem Relation Age of Onset   Heart attack Father    Heart disease Father    Cancer Father    Hypertension Father    Hypertension Brother    Stroke Brother    Hypertension Brother    Diabetes Son    Hyperlipidemia Son    Hypertension Son     Prior to Admission medications   Medication Sig Start Date End Date Taking? Authorizing Provider  acetaminophen (TYLENOL) 500 MG tablet Take 500 mg by mouth at bedtime.    [provider]  amiodarone (PACERONE) 100 MG tablet Take 100 mg by mouth 3 (three) times a week.    [provider]  atorvastatin (LIPITOR) 20 MG tablet Take 20 mg by mouth every other day.    [provider]  benzonatate Lavella Lemons)  100 MG capsule Take 1 capsule (100 mg total) by mouth every 8 (eight) hours. 05/10/22   Deno Etienne, DO  carvedilol (COREG) 12.5 MG tablet TAKE ONE TABLET BY MOUTH TWICE DAILY WITH A MEAL 01/18/22   Bensimhon, Shaune Pascal, MD  cholecalciferol (VITAMIN D3) 25 MCG (1000 UNIT) tablet Take 1,000 Units by mouth every morning.    [provider]  Cyanocobalamin (VITAMIN B 12 PO) Take 1 tablet by mouth daily in the afternoon.    [provider]  diphenhydrAMINE (BENADRYL) 25 mg capsule Take 25 mg by mouth every 6 (six) hours as needed (allergies/sleep).    [provider]  Ensure (ENSURE) Take 237 mLs by mouth daily.    [provider]  HM LIDOCAINE PATCH EX Apply 5 % topically as needed.    [provider]  hydrALAZINE (APRESOLINE) 50 MG tablet TAKE ONE TABLET BY MOUTH TWICE DAILY 10/20/21   Nahser, Wonda Cheng, MD  ondansetron (ZOFRAN-ODT) 4 MG disintegrating tablet '4mg'$  ODT q4 hours prn nausea/vomit 05/10/22   Deno Etienne, DO  pantoprazole (PROTONIX) 40 MG  tablet Take 1 tablet (40 mg total) by mouth in the morning and at bedtime. 04/16/22 07/15/22  [place] Donnamarie Poag, DO  Polyethyl Glycol-Propyl Glycol 0.4-0.3 % SOLN Place 1 drop into both eyes every 8 (eight) hours as needed (dryness).    [provider]  polyethylene glycol (MIRALAX / GLYCOLAX) 17 g packet Take 17 g by mouth daily as needed for mild constipation (constipation.).    [provider]  sucralfate (CARAFATE) 1 GM/10ML suspension Take 10 mLs (1 g total) by mouth 4 (four) times daily. 04/16/22 07/15/22  [place] Donnamarie Poag, DO  torsemide (DEMADEX) 20 MG tablet Please take 40 mg alternating daily with 20 mg every other day. 04/23/22   Rafael Bihari, FNP    Physical Exam: Vitals:   05/13/22 1545 05/13/22 1615 05/13/22 1630 05/13/22 1645  BP: 140/60 133/76 (!) 135/96 (!) 131/52  Pulse:  63 81 (!) 51  Resp: (!) 27 (!) 25 (!) 25 (!) 28  Temp:      TempSrc:      SpO2: 96% 99% 99% 99%  Weight:      Height:       General:  Appears calm and comfortable and is in NAD. Dyspnea with talking Eyes:  PERRL, EOMI, normal lids, iris ENT:  grossly normal hearing, lips & tongue, mmm; appropriate dentition Neck:  no LAD, masses or thyromegaly; no carotid bruits Cardiovascular:  irregular, no m/r/g. No LE edema.  Respiratory:   course breath sounds throughout. Decreased air movement in RUL. Dyspnea with talking and mild retractions.  Abdomen:  soft, NT, mildly distended, NABS Back:   normal alignment, no CVAT Skin:  no rash or induration seen on limited exam Musculoskeletal:  grossly normal tone BUE/BLE, good ROM, no bony abnormality Lower extremity:  No LE edema.  Limited foot exam with no ulcerations.  2+ distal pulses. Psychiatric:  grossly normal mood and affect, speech fluent and appropriate, AOx3 Neurologic:  CN 2-12 grossly intact, moves all extremities in coordinated fashion, sensation intact   Radiological Exams on Admission: Independently reviewed - see discussion in A/P  where applicable  DG Chest Port 1 View  Result Date: 05/13/2022 CLINICAL DATA:  Shortness of breath, possible sepsis EXAM: PORTABLE CHEST 1 VIEW COMPARISON:  05/10/2022 FINDINGS: Cardiomegaly. Left chest multi lead pacer. Calcified AP window lymph nodes. Very extensive new heterogeneous and consolidative airspace opacity of the right upper lobe.  The visualized skeletal structures are unremarkable. IMPRESSION: 1. Very extensive new heterogeneous and consolidative airspace opacity of the right upper lobe, concerning for infection. 2.  Cardiomegaly. Electronically Signed   By: Delanna Ahmadi M.D.   On: 05/13/2022 15:50    EKG: Independently reviewed.  NSR with rate 102 mobitz type 2; nonspecific ST changes with no evidence of acute ischemia Repeat: A-v paced rhythm, rate of 100   Labs on Admission: I have personally reviewed the available labs and imaging studies at the time of the admission.  Pertinent labs:   wbc: 15.3,  hgb: 10.2,  sodium: 132,  BUN: 42,  creatinine: 2.65, troponin 48>    BNP: 1351,  lactic acid: 2.1  Assessment and Plan: Active Problems:   Acute hypoxemic respiratory failure (HCC)   sepsis secondary to hospital-acquired pneumonia   Acute on chronic combined systolic and diastolic CHF (congestive heart failure) (HCC)   Acute renal failure superimposed on stage 4 chronic kidney disease (HCC)   Elevated troponin   Paroxysmal atrial fibrillation (HCC)   Diabetes mellitus with complication (HCC)   Uncontrolled hypertension   Mixed hyperlipidemia   Microcytic anemia   Sepsis (HCC)    Assessment and Plan: Acute hypoxemic respiratory failure ([place]) 86 year old presenting with acute respiratory failure with hypoxia with oxygen to 74% on room air, dyspnea/tachypnea secondary to multiple medical issues -admit to progressive -bipap>HFNC, tolerating well -likely secondary to acute on chronic systolic and diastolic CHF as well as HCAP.  -diurese -HCAP antibiotics -has hx  of PAF and off eliquis since June. PE on differential; however,  can not CTA due to her kidneys and anticoagulation has been contraindicated due to her frequent GI bleeds. unfortunately she has multiple issues that will elevate her d-dimer with sepsis/renal disease so this alone will not help with imaging. Discussed with patient. I do have an echo pending as well and if no improvement consider v/q scan.  -wean oxygen as tolerated  -NPO for now   sepsis secondary to hospital-acquired pneumonia Recently hospitalized for GI bleed in June. Meets sepsis criteria with leukocytosis, tachycardia, tachypnea CXR with RUL pneumonia, leukocytosis, cough and worsening shortness of breath and respiratory failure Check urinary antigens Blood cultures pending Sputum culture pending  Continue cefepime/vanc and tailor over next 24+ hours PCT pending Covid/RVP pending  xoponex prn -mucinex BID (she may not be able to swallow this, may be too large) -robitussin DM  -IS to bedside     Acute on chronic combined systolic and diastolic CHF (congestive heart failure) ([place]) Presenting with respiratory failure, BNP>1000, shortness of breath and CXR with congestion as well as abdominal swelling. Weight stable.  -given '40mg'$  of IV lasix in ED. Will not give more, follow output.  -? Cardio renal syndrome or pre renal from poor PO intake and ATN from  sepsis  -discussed with cardiology on call and felt more sepsis/PNA related. '40mg'$  lasix only and heart failure team will follow tomorrow.  Last echo: 09/2021 with EF of 50-55% and grade 1 diastolic dysfunction. Severely dilated left atria normal RVF.  Strict I/o Daily weights Repeat echo pending  cardiology consulted as she is tenuous   Acute renal failure superimposed on stage 4 chronic kidney disease (HCC) Baseline creatinine 1.9-2.3 and up to 2.6 today ? Prerenal/ATN from sepsis/poor PO intake or possible cardio-renal syndrome Given '40mg'$  of lasix in ED. Will not  give more at this time.  UA pending, has not urinated all day  Strict I/O Hold/avoid nephrotoxic  drugs -trend and follow renal function closely  -no improvement, nephrology consult.   Elevated troponin Likely secondary to demand ischemia in setting of CHF exacerbation/pneumonia and respiratory failure  She is having no chest pain Trend troponin, continue telemetry Echo pending    Paroxysmal atrial fibrillation ([place]) With frequent hospitalizations 2/2 GIB, she will need to stop Eliquis indefinitely-per cardiology  Continue her amiodarone MWF holding coreg for now with soft bp  Diabetes mellitus with complication (HCC) Y6R of 7.8 in 03/2022 SSI and accuchecks qac/hs   Uncontrolled hypertension Blood pressure initially soft, but readings have consistently been stable, mildly elevated.  Home medication includes: Carvedilol 12.5 mg p.o. twice daily, Hydralazine 50 mg p.o. twice daily, torsemide. Hold home meds for now with soft bp and acute on chronic CHF PRN parameters placed.    Mixed hyperlipidemia Continue Atorvastatin 20 mg p.o. every other day  Microcytic anemia Patient receives intermittent iron transfusions outpatient by nephrology, Dr. Hollie Salk.   Anemia panel done during June hospitalization with iron 21, TIBC 314, saturation 7.  She received Ferrlecit '250mg'$  IV daily x 2.  hgb stable today at 10.9, continue to follow.  Will do heparin VTE prophylaxis, follow closely  Outpatient follow-up with nephrology/infusion clinic.    Advance Care Planning:   Code Status: DNR   Consults: cardiology:   DVT Prophylaxis: heparin   Family Communication: updated daughter by phone: Hans Eden   Severity of Illness: The appropriate patient status for this patient is INPATIENT. Inpatient status is judged to be reasonable and necessary in order to provide the required intensity of service to ensure the patient's safety. The patient's presenting symptoms, physical exam findings, and  initial radiographic and laboratory data in the context of their chronic comorbidities is felt to place them at high risk for further clinical deterioration. Furthermore, it is not anticipated that the patient will be medically stable for discharge from the hospital within 2 midnights of admission.   * I certify that at the point of admission it is my clinical judgment that the patient will require inpatient hospital care spanning beyond 2 midnights from the point of admission due to high intensity of service, high risk for further deterioration and high frequency of surveillance required.*  Author: Orma Flaming, MD 05/13/2022 7:35 PM  For on call review [email or web address].

## 2022-05-13 NOTE — ED Notes (Signed)
Pt reports feeling shob x 1 week which has progressively gotten worse. Pt reports dyspnea at rest.

## 2022-05-13 NOTE — Assessment & Plan Note (Addendum)
Right upper lobe pneumonia,due to Hemophilus  Complicated with severe sepsis present on admission.  Acute hypoxemic respiratory failure.   Patient continue very weak and deconditioned, persistent dyspnea.  02 saturation is 99 on 5 L per HFNC  RR in th 20's  Wbc worsening at 15,2  Follow up chest radiograph with persistent large infiltrate at the right upper lobe.   Patient with rapid decline in her physical functional status. Despite medical therapy she continue to decline.  She has decided to transition to comfort care. Will continue supportive 02 per [place], analgesics and bronchodilators.

## 2022-05-13 NOTE — Assessment & Plan Note (Addendum)
Likely secondary to demand ischemia in setting of CHF exacerbation/pneumonia and respiratory failure  She is having no chest pain Trend troponin, continue telemetry Echo pending

## 2022-05-13 NOTE — Assessment & Plan Note (Signed)
Continue Atorvastatin 20 mg p.o. every other day

## 2022-05-13 NOTE — ED Notes (Signed)
Pt reports no home O2 use. RT placing pt on heated high flow at this time.

## 2022-05-13 NOTE — Progress Notes (Signed)
Patient arrived by EMS on CPAP.  Patient was taken off of CPAP and placed on bipap.  Patient was tolerating well however patient hen began stating that she felt sick.  Patient was removed from bipap and once patient was removed, patient began to vomit.  Patient was then placed on heated high flow at 25L and 60%.  Patient is currently tolerating well at this time.  Will continue to monitor.

## 2022-05-13 NOTE — ED Provider Notes (Signed)
[place] EMERGENCY DEPARTMENT Provider Note   CSN: [id number] Arrival date & time:        History  Chief Complaint  Patient presents with   Respiratory Distress    Krista Mason is a 86 y.o. female.  HPI  Patient is an 86 year old female with a history of A-fib on Eliquis, combined systolic/diastolic heart failure (EF 50-55% 09/2021, she takes furosemide 40 mg daily), DM2, HTN, HLD, CKD stage IV (baseline creatinine 2.1), esophageal strictures, anemia of chronic disease who presents emergency department for evaluation of respiratory distress.  According to EMS they were called out to the patient's residence in the setting of respiratory distress.  Patient was having some associated nausea.  Upon arrival her O2 saturations were 74% on room air and she was started on CPAP which bumped her saturations up to 94%.  She was given x4 sublingual nitro.  She does have a history of pulmonary edema.  Patient states that over the past several days she has been having intermittent fevers at home highest 100.2.  She reports intermittent cough and continuing to feel short of breath.  Patient was admitted to the hospital from 6/22 through 04/16/2022 here at [facility] in the setting of the asymptomatic GI bleed in the setting of 1 week of tarry stools.  She follows with a full gastroenterology Dr.Brahmbhatt.  At that time patient's Eliquis was discontinued for least 2 weeks in the setting of her GI bleed until such time that she followed up with her cardiologist Dr.Bensimhon.  She was discharged on Protonix 40 mg twice daily and Carafate 3 times daily.  Of note she is also taking carvedilol 12.5 mg twice daily, amiodarone '100mg'$  3 times daily every Monday Wednesday Friday.  Per old records patient was evaluated again at [facility] on 05/10/2022 in the setting of coughing, congestion and nausea for 4 to 5 days.  She had a leukocytosis at that time of 14.1.  Chest x-ray at that time  showed no new infiltrates or signs of pulmonary edema.     Home Medications Prior to Admission medications   Medication Sig Start Date End Date Taking? Authorizing Provider  acetaminophen (TYLENOL) 500 MG tablet Take 1,000 mg by mouth every 6 (six) hours as needed for fever.   Yes [provider]  amiodarone (PACERONE) 100 MG tablet Take 100 mg by mouth every Monday, Wednesday, and Friday.   Yes [provider]  benzonatate (TESSALON) 100 MG capsule Take 1 capsule (100 mg total) by mouth every 8 (eight) hours. 05/10/22  Yes Deno Etienne, DO  carvedilol (COREG) 12.5 MG tablet TAKE ONE TABLET BY MOUTH TWICE DAILY WITH A MEAL Patient taking differently: Take 12.5 mg by mouth 2 (two) times daily with a meal. 01/18/22  Yes Bensimhon, Shaune Pascal, MD  cholecalciferol (VITAMIN D3) 25 MCG (1000 UNIT) tablet Take 1,000 Units by mouth every morning.   Yes [provider]  Cyanocobalamin (VITAMIN B 12 PO) Take 1 tablet by mouth daily in the afternoon.   Yes [provider]  diphenhydrAMINE (BENADRYL) 25 mg capsule Take 25 mg by mouth every 6 (six) hours as needed for allergies or itching (allergies/sleep).   Yes [provider]  Ensure (ENSURE) Take 237 mLs by mouth daily.   Yes [provider]  HM LIDOCAINE PATCH EX Apply 5 % topically daily as needed (pain).   Yes [provider]  hydrALAZINE (APRESOLINE) 50 MG tablet TAKE ONE TABLET BY MOUTH TWICE  DAILY Patient taking differently: Take 50 mg by mouth 2 (two) times daily. 10/20/21  Yes Nahser, Wonda Cheng, MD  ondansetron (ZOFRAN-ODT) 4 MG disintegrating tablet '4mg'$  ODT q4 hours prn nausea/vomit Patient taking differently: Take 4 mg by mouth every 8 (eight) hours as needed for vomiting or nausea. 05/10/22  Yes Deno Etienne, DO  pantoprazole (PROTONIX) 40 MG tablet Take 1 tablet (40 mg total) by mouth in the morning and at bedtime. 04/16/22 07/15/22 Yes [place] Eric J, DO  Polyethyl Glycol-Propyl Glycol  0.4-0.3 % SOLN Place 1 drop into both eyes every 8 (eight) hours as needed (dryness).   Yes [provider]  polyethylene glycol (MIRALAX / GLYCOLAX) 17 g packet Take 17 g by mouth daily as needed for mild constipation (constipation.).   Yes [provider]  sucralfate (CARAFATE) 1 GM/10ML suspension Take 10 mLs (1 g total) by mouth 4 (four) times daily. 04/16/22 07/15/22 Yes [place] Eric J, DO  torsemide (DEMADEX) 20 MG tablet Please take 40 mg alternating daily with 20 mg every other day. Patient taking differently: Take 40-60 mg by mouth See admin instructions. Takes 40 mg daily and take additional 20 mg if needed. 04/23/22  Yes [place], Maricela Bo, FNP      Allergies    Penicillins, Retacrit [epoetin (alfa)], Crestor [rosuvastatin], Hydrocodone, Influenza vac split quad, Tape, Cefaclor, Crestor [rosuvastatin calcium], Influenza vaccines, Lidocaine hcl, Nsaids, Percocet [oxycodone-acetaminophen], and Prednisone    Review of Systems   Review of Systems  Physical Exam Updated Vital Signs BP 119/74   Pulse (!) 104   Temp 99 F (37.2 C) (Temporal)   Resp (!) 25   Ht '5\' 5"'$  (1.651 m)   Wt 65.3 kg   SpO2 97%   BMI 23.96 kg/m  Physical Exam Vitals and nursing note reviewed.  Constitutional:      General: She is in acute distress.     Appearance: She is well-developed. She is obese.  HENT:     Head: Normocephalic and atraumatic.     Right Ear: External ear normal.     Left Ear: External ear normal.     Nose: Nose normal.     Mouth/Throat:     Mouth: Mucous membranes are moist.     Pharynx: Oropharynx is clear.  Eyes:     Extraocular Movements: Extraocular movements intact.     Conjunctiva/sclera: Conjunctivae normal.     Pupils: Pupils are equal, round, and reactive to light.  Neck:     Comments: CPAP in place Cardiovascular:     Rate and Rhythm: Regular rhythm. Bradycardia present.     Heart sounds: No murmur heard. Pulmonary:     Effort: Respiratory distress  present.     Breath sounds: No stridor. Wheezing (Upper lung fields) and rales (Lower lung fields) present.  Abdominal:     General: There is distension.     Palpations: Abdomen is soft.     Tenderness: There is no abdominal tenderness. There is no guarding or rebound.  Musculoskeletal:        General: No swelling.     Cervical back: Neck supple.     Right lower leg: No edema.     Left lower leg: No edema.  Lymphadenopathy:     Cervical: No cervical adenopathy.  Skin:    General: Skin is warm and dry.     Capillary Refill: Capillary refill takes less than 2 seconds.     Coloration: Skin is not jaundiced.  Neurological:  General: No focal deficit present.     Mental Status: She is alert and oriented to person, place, and time.  Psychiatric:        Mood and Affect: Mood normal.     ED Results / Procedures / Treatments   Labs (all labs ordered are listed, but only abnormal results are displayed) Labs Reviewed  LACTIC ACID, PLASMA - Abnormal; Notable for the following components:      Result Value   Lactic Acid, Venous 2.1 (*)    All other components within normal limits  COMPREHENSIVE METABOLIC PANEL - Abnormal; Notable for the following components:   Sodium 132 (*)    Chloride 96 (*)    Glucose, Bld 293 (*)    BUN 42 (*)    Creatinine, Ser 2.65 (*)    Albumin 2.5 (*)    GFR, Estimated 17 (*)    All other components within normal limits  CBC WITH DIFFERENTIAL/PLATELET - Abnormal; Notable for the following components:   WBC 15.3 (*)    RBC 3.44 (*)    Hemoglobin 10.2 (*)    HCT 32.8 (*)    RDW 16.5 (*)    Neutro Abs 13.6 (*)    Lymphs Abs 0.6 (*)    Abs Immature Granulocytes 0.10 (*)    All other components within normal limits  PROTIME-INR - Abnormal; Notable for the following components:   Prothrombin Time 15.3 (*)    All other components within normal limits  BRAIN NATRIURETIC PEPTIDE - Abnormal; Notable for the following components:   B Natriuretic Peptide  1,351.3 (*)    All other components within normal limits  D-DIMER, QUANTITATIVE - Abnormal; Notable for the following components:   D-Dimer, Quant 2.95 (*)    All other components within normal limits  I-STAT VENOUS BLOOD GAS, ED - Abnormal; Notable for the following components:   pO2, Ven 63 (*)    Sodium 133 (*)    HCT 32.0 (*)    Hemoglobin 10.9 (*)    All other components within normal limits  CBG MONITORING, ED - Abnormal; Notable for the following components:   Glucose-Capillary 289 (*)    All other components within normal limits  TROPONIN I (HIGH SENSITIVITY) - Abnormal; Notable for the following components:   Troponin I (High Sensitivity) 48 (*)    All other components within normal limits  TROPONIN I (HIGH SENSITIVITY) - Abnormal; Notable for the following components:   Troponin I (High Sensitivity) 34 (*)    All other components within normal limits  CULTURE, BLOOD (ROUTINE X 2)  CULTURE, BLOOD (ROUTINE X 2)  URINE CULTURE  RESPIRATORY PANEL BY PCR  SARS CORONAVIRUS 2 BY RT PCR  EXPECTORATED SPUTUM ASSESSMENT W GRAM STAIN, RFLX TO RESP C  MRSA NEXT GEN BY PCR, NASAL  LACTIC ACID, PLASMA  APTT  URINALYSIS, ROUTINE W REFLEX MICROSCOPIC  PROCALCITONIN  STREP PNEUMONIAE URINARY ANTIGEN  MAGNESIUM  PROCALCITONIN  BASIC METABOLIC PANEL  CBC  LEGIONELLA PNEUMOPHILA SEROGP 1 UR AG    EKG EKG Interpretation  Date/Time:  Sunday May 13 2022 15:23:58 EDT Ventricular Rate:  102 PR Interval:  152 QRS Duration: 130 QT Interval:  406 QTC Calculation: 486 R Axis:   248 Text Interpretation: Second degree AV block, Mobitz II Ventricular tachycardia, unsustained Right bundle branch block Probable inferior infarct, acute Lateral leads are also involved PVC new since previous Confirmed by Wandra Arthurs [phone]) on 05/13/2022 3:25:58 PM  Radiology DG Chest [facility] [phone]  Result Date: 05/13/2022 CLINICAL DATA:  Shortness of breath, possible sepsis EXAM: PORTABLE CHEST 1 VIEW  COMPARISON:  05/10/2022 FINDINGS: Cardiomegaly. Left chest multi lead pacer. Calcified AP window lymph nodes. Very extensive new heterogeneous and consolidative airspace opacity of the right upper lobe. The visualized skeletal structures are unremarkable. IMPRESSION: 1. Very extensive new heterogeneous and consolidative airspace opacity of the right upper lobe, concerning for infection. 2.  Cardiomegaly. Electronically Signed   By: Delanna Ahmadi M.D.   On: 05/13/2022 15:50    Procedures Procedures    Medications Ordered in ED Medications  ceFEPIme (MAXIPIME) 2 g in sodium chloride 0.9 % 100 mL IVPB (has no administration in time range)  heparin injection 5,000 Units (5,000 Units Subcutaneous Given 05/13/22 2157)  acetaminophen (TYLENOL) tablet 650 mg (has no administration in time range)    Or  acetaminophen (TYLENOL) suppository 650 mg (has no administration in time range)  ondansetron (ZOFRAN) tablet 4 mg (has no administration in time range)    Or  ondansetron (ZOFRAN) injection 4 mg (has no administration in time range)  guaiFENesin-dextromethorphan (ROBITUSSIN DM) 100-10 MG/5ML syrup 5 mL (has no administration in time range)  levalbuterol (XOPENEX) nebulizer solution 0.63 mg (has no administration in time range)  guaiFENesin (MUCINEX) 12 hr tablet 600 mg (600 mg Oral Given 05/13/22 2156)  pantoprazole (PROTONIX) injection 40 mg (40 mg Intravenous Given 05/13/22 2156)  amiodarone (PACERONE) tablet 100 mg (has no administration in time range)  polyvinyl alcohol (LIQUIFILM TEARS) 1.4 % ophthalmic solution 1 drop (has no administration in time range)  sucralfate (CARAFATE) 1 GM/10ML suspension 1 g (1 g Oral Not Given 05/13/22 2205)  hydrALAZINE (APRESOLINE) injection 5 mg (has no administration in time range)  insulin aspart (novoLOG) injection 0-6 Units (has no administration in time range)  vancomycin (VANCOREADY) IVPB 750 mg/150 mL (has no administration in time range)  ondansetron  (ZOFRAN) injection 4 mg (4 mg Intravenous Given 05/13/22 1537)  vancomycin (VANCOCIN) IVPB 1000 mg/200 mL premix (0 mg Intravenous Stopped 05/13/22 1929)  ceFEPIme (MAXIPIME) 2 g in sodium chloride 0.9 % 100 mL IVPB (0 g Intravenous Stopped 05/13/22 1743)  furosemide (LASIX) injection 40 mg (40 mg Intravenous Given 05/13/22 1823)  metoprolol tartrate (LOPRESSOR) injection 2.5 mg (2.5 mg Intravenous Given 05/13/22 2156)    ED Course/ Medical Decision Making/ A&P                           Medical Decision Making Problems Addressed: Acute on chronic combined systolic and diastolic CHF (congestive heart failure) ([place]): complicated acute illness or injury with systemic symptoms that poses a threat to life or bodily functions Hospital-acquired pneumonia: complicated acute illness or injury with systemic symptoms that poses a threat to life or bodily functions  Amount and/or Complexity of Data Reviewed Independent Historian: EMS External Data Reviewed: labs, radiology and notes. Labs: ordered. Radiology: ordered. ECG/medicine tests: ordered.  Risk Prescription drug management. Decision regarding hospitalization.   Patient is a 86 year old female presents emergency department as above.  On initial presentation patient's pulse is 42 with respirations 23%.  She is saturating 93% on CPAP.  Initial VBG showed a pH 7.373, CO2 46.3, bicarb 28.  In the circumstance of no CO2 retention patient was switched from BiPAP over to high flow nasal cannula.  In the setting of the patient's ongoing coughing, abdominal distention as well as shortness of breath suspicion is highest for fluid overload versus pneumonia.  Based on  laboratory work and imaging studies were ordered.  Baseline laboratory work showed a slight hyponatremia at 132, BUN 42 and creatinine 2.65 (elevated from her baseline 2.1).  Initial troponin 48, BNP elevated at 1351.  Evidence of leukocytosis of 15.3 with normocytic anemia of 10.2 stable from  prior.  Chest x-ray showed very extensive new heterogeneous and consolidative airspace opacity in the right upper lobe concerning for infection.  Initial lactate 2.1.  In this circumstance the patient was started on cefepime and vancomycin for broad coverage of suspected hospital-acquired pneumonia.  Blood cultures were drawn.  COVID flu swab pending.  Inpatient hospitalist team was contacted for this patient and was felt likely appropriate for stepdown care.  40 mg of IV Lasix was initiated in the setting of her acute on chronic CHF exacerbation.  Plan and findings were discussed with patient at bedside and he verbalized understanding was agreeable.  Please see inpatient provider notes for additional details regarding this patient's ongoing hospital course and management.        Final Clinical Impression(s) / ED Diagnoses Final diagnoses:  Hospital-acquired pneumonia  Acute on chronic combined systolic and diastolic CHF (congestive heart failure) [facility])    Rx / DC Orders ED Discharge Orders     None         Nelta Numbers, MD 05/14/22 0015    Drenda Freeze, MD 05/22/22 [phone]

## 2022-05-13 NOTE — Assessment & Plan Note (Addendum)
86 year old presenting with acute respiratory failure with hypoxia with oxygen to 74% on room air, dyspnea/tachypnea secondary to multiple medical issues -admit to progressive -bipap>HFNC, tolerating well -likely secondary to acute on chronic systolic and diastolic CHF as well as HCAP.  -diurese -HCAP antibiotics -has hx of PAF and off eliquis since June. PE on differential; however,  can not CTA due to her kidneys and anticoagulation has been contraindicated due to her frequent GI bleeds. unfortunately she has multiple issues that will elevate her d-dimer with sepsis/renal disease so this alone will not help with imaging. Discussed with patient. I do have an echo pending as well and if no improvement consider v/q scan.  -wean oxygen as tolerated  -NPO for now

## 2022-05-13 NOTE — Assessment & Plan Note (Addendum)
Echocardiogram with mild reduction in systolic function 40 to 45%, global hypkinesis, internal cavity with mild dilatation, LVH, RV systolic function preserved, RVSP 53,9, small pericardial effusion. Moderate to severe mitral valve regurgitation, moderate tricuspid regurgitation.  Acute on chronic core pulmonale.   Patient received palliative diuresis during her last days of hospitalization.

## 2022-05-13 NOTE — Assessment & Plan Note (Addendum)
Renal function with serum cr at 2,25 with K at 3,8 and serum bicarbonate at 30. Na is 137,  Plan to continue diuresis with furosemide and follow up renal function and electrolytes in am.

## 2022-05-13 NOTE — Assessment & Plan Note (Signed)
Blood pressure initially soft, but readings have consistently been stable, mildly elevated.  Home medication includes: Carvedilol 12.5 mg p.o. twice daily, Hydralazine 50 mg p.o. twice daily, torsemide. Hold home meds for now with soft bp and acute on chronic CHF PRN parameters placed.

## 2022-05-13 NOTE — Assessment & Plan Note (Addendum)
Steroid induced hyperglycemia with fasting insulin today 318 mg/dl Continue insulin sliding scale for glucose cover and monitoring.  Basal insulin 30 units and pre meal 6 units of short acting.   Systemic steroids have been discontinued.   Continue with statin therapy.

## 2022-05-13 NOTE — Progress Notes (Addendum)
Pharmacy Antibiotic Note  Krista Mason is a 86 y.o. female admitted on 05/13/2022 presenting in respiratory distress, concern for pna.  Pharmacy has been consulted for cefepime dosing.  Plan: Cefepime 2g IV every 24 hours Monitor renal function, clinical progression to narrow  Addendum: To continue vancomycin, 1000 mg given in ED Vancomycin 750 mg IV q 48h (eAUC 492, SCr 2.65) Add MRSA PCR Vancomycin levels as needed   Height: '5\' 5"'$  (165.1 cm) Weight: 65.3 kg (144 lb) IBW/kg (Calculated) : 57  Temp (24hrs), Avg:98 F (36.7 C), Min:98 F (36.7 C), Max:98 F (36.7 C)  Recent Labs  Lab 05/10/22 1231 05/13/22 1519 05/13/22 1527  WBC 14.1* 15.3*  --   CREATININE 2.22* 2.65*  --   LATICACIDVEN  --   --  2.1*    Estimated Creatinine Clearance: 14.2 mL/min (A) (by C-G formula based on SCr of 2.65 mg/dL (H)).    Allergies  Allergen Reactions   Epoetin Alfa Nausea Only and Other (See Comments)    Procrit and Epogen: "Chills, low grade fever, body aches, fatigue, nausea for 3 days"    Penicillins Itching and Rash    Amoxicillin ok- IM pen is what gives reaction  Did it involve swelling of the face/tongue/throat, SOB, or low BP? No Did it involve sudden or severe rash/hives, skin peeling, or any reaction on the inside of your mouth or nose? No Did you need to seek medical attention at a hospital or doctor's office? No When did it last happen?      10 + year If all above answers are "NO", may proceed with cephalosporin use.  Other reaction(s): rash, itching   Retacrit [Epoetin (Alfa)] Other (See Comments)    Chills, low grade fever, body aches, fatigue, nausea for 3 days.   Crestor [Rosuvastatin] Nausea Only   Hydrocodone Itching and Other (See Comments)    And the patient "got mean"   Influenza Vac Split Quad Swelling and Other (See Comments)    Local swelling and fever   Tape Other (See Comments)    The skin tears easily   Cefaclor Itching   Crestor [Rosuvastatin  Calcium] Nausea Only   Influenza Vaccines Swelling and Other (See Comments)    Swelling and redness at injection site, fever   Lidocaine Hcl Palpitations and Other (See Comments)    Heart racing if Lidocaine is with EPI   Nsaids Other (See Comments)    Pt states she is not taking because of kidney function    Percocet [Oxycodone-Acetaminophen] Nausea And Vomiting   Prednisone Other (See Comments)    Dizziness, spiked blood sugar     Bertis Ruddy, PharmD Clinical Pharmacist ED Pharmacist Phone # [phone] 05/13/2022 5:46 PM

## 2022-05-13 NOTE — Assessment & Plan Note (Addendum)
Anemia of chronic disease.  Continue close follow up on cell count.

## 2022-05-13 NOTE — ED Triage Notes (Signed)
Pt BIB EMS due to resp distress. Pt also nauseous. Pt received 4 SL nitros. Pt was 74% on RA and 94% on CPAP. Pt has hx of pulmonary edema. Pt axox4.

## 2022-05-13 NOTE — Assessment & Plan Note (Addendum)
Patient with intermittent paced rhythm, underlying atrial fibrillation with rate controlled.  Continue amiodarone every other day M-W-F.  No anticoagulation due to recent GI bleed.  Continue telemetry monitoring.

## 2022-05-14 ENCOUNTER — Inpatient Hospital Stay (HOSPITAL_COMMUNITY): Payer: Medicare HMO

## 2022-05-14 DIAGNOSIS — J189 Pneumonia, unspecified organism: Secondary | ICD-10-CM

## 2022-05-14 DIAGNOSIS — I5043 Acute on chronic combined systolic (congestive) and diastolic (congestive) heart failure: Secondary | ICD-10-CM

## 2022-05-14 DIAGNOSIS — Y95 Nosocomial condition: Secondary | ICD-10-CM | POA: Diagnosis not present

## 2022-05-14 LAB — ECHOCARDIOGRAM COMPLETE
AR max vel: 2.48 cm2
AV Area VTI: 2.61 cm2
AV Area mean vel: 2.52 cm2
AV Mean grad: 4 mmHg
AV Peak grad: 7.4 mmHg
Ao pk vel: 1.36 m/s
Area-P 1/2: 4.49 cm2
Calc EF: 40.7 %
Height: 65 in
MV M vel: 5.19 m/s
MV Peak grad: 107.7 mmHg
MV VTI: 1.96 cm2
Radius: 0.45 cm
S' Lateral: 2.8 cm
Single Plane A2C EF: 51.1 %
Single Plane A4C EF: 29 %
Weight: 2304 oz

## 2022-05-14 LAB — GLUCOSE, CAPILLARY
Glucose-Capillary: 298 mg/dL — ABNORMAL HIGH (ref 70–99)
Glucose-Capillary: 275 mg/dL — ABNORMAL HIGH (ref 70–99)

## 2022-05-14 LAB — CBC
HCT: 31.7 % — ABNORMAL LOW (ref 36.0–46.0)
Hemoglobin: 10 g/dL — ABNORMAL LOW (ref 12.0–15.0)
MCH: 29.4 pg (ref 26.0–34.0)
MCHC: 31.5 g/dL (ref 30.0–36.0)
MCV: 93.2 fL (ref 80.0–100.0)
Platelets: 227 10*3/uL (ref 150–400)
RBC: 3.4 MIL/uL — ABNORMAL LOW (ref 3.87–5.11)
RDW: 16.5 % — ABNORMAL HIGH (ref 11.5–15.5)
WBC: 10.7 10*3/uL — ABNORMAL HIGH (ref 4.0–10.5)
nRBC: 0 % (ref 0.0–0.2)

## 2022-05-14 LAB — BASIC METABOLIC PANEL WITH GFR
Anion gap: 12 (ref 5–15)
BUN: 45 mg/dL — ABNORMAL HIGH (ref 8–23)
CO2: 25 mmol/L (ref 22–32)
Calcium: 9.3 mg/dL (ref 8.9–10.3)
Chloride: 97 mmol/L — ABNORMAL LOW (ref 98–111)
Creatinine, Ser: 2.52 mg/dL — ABNORMAL HIGH (ref 0.44–1.00)
GFR, Estimated: 18 mL/min — ABNORMAL LOW
Glucose, Bld: 282 mg/dL — ABNORMAL HIGH (ref 70–99)
Potassium: 4 mmol/L (ref 3.5–5.1)
Sodium: 134 mmol/L — ABNORMAL LOW (ref 135–145)

## 2022-05-14 LAB — CBG MONITORING, ED
Glucose-Capillary: 276 mg/dL — ABNORMAL HIGH (ref 70–99)
Glucose-Capillary: 234 mg/dL — ABNORMAL HIGH (ref 70–99)

## 2022-05-14 LAB — MRSA NEXT GEN BY PCR, NASAL: MRSA by PCR Next Gen: NOT DETECTED

## 2022-05-14 LAB — PROCALCITONIN: Procalcitonin: 3.61 ng/mL

## 2022-05-14 MED ORDER — CARVEDILOL 12.5 MG PO TABS
12.5000 mg | ORAL_TABLET | Freq: Two times a day (BID) | ORAL | Status: DC
Start: 2022-05-14 — End: 2022-05-19
  Administered 2022-05-14 – 2022-05-19 (×11): 12.5 mg via ORAL
  Filled 2022-05-14 (×11): qty 1

## 2022-05-14 MED ORDER — INSULIN DETEMIR 100 UNIT/ML ~~LOC~~ SOLN
15.0000 [IU] | Freq: Every day | SUBCUTANEOUS | Status: DC
  Administered 2022-05-14: 15 [IU] via SUBCUTANEOUS
  Filled 2022-05-14 (×2): qty 0.15

## 2022-05-14 MED ORDER — METHYLPREDNISOLONE SODIUM SUCC 125 MG IJ SOLR
60.0000 mg | Freq: Two times a day (BID) | INTRAMUSCULAR | Status: DC
  Administered 2022-05-14 – 2022-05-17 (×7): 60 mg via INTRAVENOUS
  Filled 2022-05-14 (×7): qty 2

## 2022-05-14 MED ORDER — IPRATROPIUM-ALBUTEROL 0.5-2.5 (3) MG/3ML IN SOLN
3.0000 mL | Freq: Four times a day (QID) | RESPIRATORY_TRACT | Status: DC
Start: 2022-05-14 — End: 2022-05-20
  Administered 2022-05-14 – 2022-05-20 (×21): 3 mL via RESPIRATORY_TRACT
  Filled 2022-05-14 (×20): qty 3

## 2022-05-14 MED ORDER — INSULIN ASPART 100 UNIT/ML IJ SOLN
3.0000 [IU] | Freq: Three times a day (TID) | INTRAMUSCULAR | Status: DC
  Administered 2022-05-14 (×2): 3 [IU] via SUBCUTANEOUS

## 2022-05-14 MED ORDER — PANTOPRAZOLE SODIUM 40 MG PO TBEC
40.0000 mg | DELAYED_RELEASE_TABLET | Freq: Every day | ORAL | Status: DC
  Administered 2022-05-14: 40 mg via ORAL
  Filled 2022-05-14: qty 1

## 2022-05-14 MED ORDER — FUROSEMIDE 10 MG/ML IJ SOLN
40.0000 mg | Freq: Once | INTRAMUSCULAR | Status: AC
Start: 2022-05-14 — End: 2022-05-14
  Administered 2022-05-14: 40 mg via INTRAVENOUS
  Filled 2022-05-14: qty 4

## 2022-05-14 NOTE — Progress Notes (Signed)
Ms. Mino admitted to [phone]. Alert and oriented x4. No complaints of pain. Oriented to room, call light and bed controls. Skin assessed with Desiray. CHG bath completed. VSS. Bed in lowest position.

## 2022-05-14 NOTE — Progress Notes (Addendum)
PROGRESS NOTE    Krista Mason  EGB:[id number] DOB: [birth year] DOA: 05/13/2022 PCP: Lawerance Cruel, MD  86/F with history of chronic combined CHF last echo with EF 50%, paroxysmal A-fib on Eliquis, emphysema, former smoker, type 2 diabetes mellitus, CKD 4 presented to the ED with worsening shortness of breath X 1 week, she did not developed low-grade fever cough and URI last week, then had worsening productive cough was seen at an ER, treated supportively and discharged home, back to the ED with worsening dyspnea, reports mild abdominal distention, no leg swelling, weight is largely unchanged.  Recent admission 04/12/2022 for GI bleed and symptomatic anemia suspected to be from esophagitis and prior esophageal ulcer, treated with supportive care, blood transfusion and Eliquis discontinued, seen by cardiology in follow-up recently, decision made not to restart Eliquis on account of recurrent bleeds. In the ED she was hypoxic, 74% on room air, placed on high flow nasal cannula, BNP was 1371, troponin was 48, WBC was 15.3, creatinine was 2.6, chest x-ray noted extensive heterogenous consolidation, airspace opacity in right upper lobe concerning for infection   Subjective: -Continues to have productive cough, starting to feel a little better  Assessment and Plan:  Acute hypoxemic respiratory failure (HCC) Primarily secondary to pneumonia and COPD exacerbation at this time Sepsis poa -Former smoker, emphysema noted on multiple prior CT chests -Required BiPAP on admission, now on 20 L O2 via HFNC -Continue vancomycin and cefepime today, follow-up cultures and MRSA PCR, follow-up COVID/flu panel -Add IV steroids, DuoNebs, flutter valve, Mucinex -Wean O2 as tolerated  Acute on chronic combined systolic and diastolic CHF -Weight unchanged from clinic visit a month ago, BNP elevated>1000 -echo: 09/2021 with EF of 50-55% and grade 1 diastolic dysfunction -Got Lasix in the ED last night, does not  appear overtly volume overloaded at this time, repeat 1 dose of IV Lasix  Acute renal failure superimposed on stage 4 chronic kidney disease (HCC) Baseline creatinine 1.9-2.3 and up to 2.6  -Stable, monitor  Elevated troponin Likely secondary to demand ischemia  -No ACS  Paroxysmal atrial fibrillation ([place]) With frequent hospitalizations 2/2 GIB, she will need to stop Eliquis indefinitely-per cardiology  -Heart rate elevated, restart carvedilol, resume amiodarone   Diabetes mellitus with complication (HCC) P7T of 7.8 in 03/2022 -CBGs elevated while on steroids, start Levemir, add meal coverage insulin SSI and accuchecks qac/hs   Uncontrolled hypertension Blood pressure initially soft, but stable now -Hold hydralazine, restart Coreg -Attempt to Lasix X1 if BP tolerates  Mixed hyperlipidemia Continue Atorvastatin 20 mg p.o. every other day  Iron deficiency anemia Recurrent GI bleeds Patient receives intermittent iron transfusions outpatient by nephrology, Dr. Hollie Salk.   Anemia panel done during June hospitalization with iron 21, TIBC 314, saturation 7.  She received Ferrlecit '250mg'$  IV daily x 2.  -Hemoglobin stable, monitor  Elevated D-dimer -Clinically do not suspect acute PE, symptoms are secondary to pneumonia/COPD exacerbation -Monitor clinically  DVT prophylaxis: Heparin subcutaneous Code Status: DNR Family Communication: Discussed with patient in detail, no family at bedside Disposition Plan: To be determined  Consultants:    Procedures:   Antimicrobials:    Objective: Vitals:   05/14/22 0830 05/14/22 0831 05/14/22 0900 05/14/22 0912  BP: 125/69 123/66 125/90 125/90  Pulse: (!) 113 (!) 112 80 (!) 113  Resp: (!) 48 (!) 35 (!) 34 (!) 31  Temp:      TempSrc:      SpO2: 94% 93% 96% 93%  Weight:  Height:        Intake/Output Summary (Last 24 hours) at 05/14/2022 1018 Last data filed at 05/13/2022 1813 Gross per 24 hour  Intake 103.68 ml  Output --   Net 103.68 ml   Filed Weights   05/13/22 1527  Weight: 65.3 kg    Examination:  General exam: Chronically ill female sitting up in bed, AAOx3, no distress HEENT: Positive JVD CVS: S1-S2, irregular rhythm Lungs: Scattered rhonchi, rare expiratory wheezes Abdomen: Soft, nontender, bowel sounds present Extremities: No edema  Skin: No rashes on exposed skin Psychiatry:  Mood & affect appropriate.     Data Reviewed:   CBC: Recent Labs  Lab 05/10/22 1231 05/13/22 1519 05/13/22 1545 05/14/22 0405  WBC 14.1* 15.3*  --  10.7*  NEUTROABS 12.5* 13.6*  --   --   HGB 10.9* 10.2* 10.9* 10.0*  HCT 34.0* 32.8* 32.0* 31.7*  MCV 92.6 95.3  --  93.2  PLT 206 222  --  384   Basic Metabolic Panel: Recent Labs  Lab 05/10/22 1231 05/13/22 1519 05/13/22 1545 05/13/22 1836 05/14/22 0405  NA 132* 132* 133*  --  134*  K 3.9 4.2 4.1  --  4.0  CL 96* 96*  --   --  97*  CO2 26 25  --   --  25  GLUCOSE 311* 293*  --   --  282*  BUN 35* 42*  --   --  45*  CREATININE 2.22* 2.65*  --   --  2.52*  CALCIUM 9.6 9.4  --   --  9.3  MG  --   --   --  2.1  --    GFR: Estimated Creatinine Clearance: 15 mL/min (A) (by C-G formula based on SCr of 2.52 mg/dL (H)). Liver Function Tests: Recent Labs  Lab 05/10/22 1231 05/13/22 1519  AST 17 20  ALT 9 12  ALKPHOS 66 75  BILITOT 0.8 0.8  PROT 7.1 6.6  ALBUMIN 3.1* 2.5*   Recent Labs  Lab 05/10/22 1231  LIPASE 28   No results for input(s): "AMMONIA" in the last 168 hours. Coagulation Profile: Recent Labs  Lab 05/13/22 1519  INR 1.2   Cardiac Enzymes: No results for input(s): "CKTOTAL", "CKMB", "CKMBINDEX", "TROPONINI" in the last 168 hours. BNP (last 3 results) No results for input(s): "PROBNP" in the last 8760 hours. HbA1C: No results for input(s): "HGBA1C" in the last 72 hours. CBG: Recent Labs  Lab 05/13/22 2214 05/14/22 0737  GLUCAP 289* 276*   Lipid Profile: No results for input(s): "CHOL", "HDL", "LDLCALC", "TRIG",  "CHOLHDL", "LDLDIRECT" in the last 72 hours. Thyroid Function Tests: No results for input(s): "TSH", "T4TOTAL", "FREET4", "T3FREE", "THYROIDAB" in the last 72 hours. Anemia Panel: No results for input(s): "VITAMINB12", "FOLATE", "FERRITIN", "TIBC", "IRON", "RETICCTPCT" in the last 72 hours. Urine analysis:    Component Value Date/Time   COLORURINE YELLOW 05/13/2022 2207   APPEARANCEUR CLEAR 05/13/2022 2207   LABSPEC 1.012 05/13/2022 2207   PHURINE 5.0 05/13/2022 2207   GLUCOSEU NEGATIVE 05/13/2022 2207   HGBUR NEGATIVE 05/13/2022 2207   BILIRUBINUR NEGATIVE 05/13/2022 2207   KETONESUR NEGATIVE 05/13/2022 2207   PROTEINUR NEGATIVE 05/13/2022 2207   UROBILINOGEN 0.2 04/15/2012 1451   NITRITE NEGATIVE 05/13/2022 2207   LEUKOCYTESUR NEGATIVE 05/13/2022 2207   Sepsis Labs: '@LABRCNTIP'$ (procalcitonin:4,lacticidven:4)  ) Recent Results (from the past 240 hour(s))  Resp Panel by RT-PCR (Flu A&B, Covid) Anterior Nasal Swab     Status: None   Collection Time: 05/10/22  12:30 PM   Specimen: Anterior Nasal Swab  Result Value Ref Range Status   SARS Coronavirus 2 by RT PCR NEGATIVE NEGATIVE Final    Comment: (NOTE) SARS-CoV-2 target nucleic acids are NOT DETECTED.  The SARS-CoV-2 RNA is generally detectable in upper respiratory specimens during the acute phase of infection. The lowest concentration of SARS-CoV-2 viral copies this assay can detect is 138 copies/mL. A negative result does not preclude SARS-Cov-2 infection and should not be used as the sole basis for treatment or other patient management decisions. A negative result may occur with  improper specimen collection/handling, submission of specimen other than nasopharyngeal swab, presence of viral mutation(s) within the areas targeted by this assay, and inadequate number of viral copies(<138 copies/mL). A negative result must be combined with clinical observations, patient history, and epidemiological information. The expected  result is Negative.  Fact Sheet for Patients:  EntrepreneurPulse.com.au  Fact Sheet for Healthcare Providers:  IncredibleEmployment.be  This test is no t yet approved or cleared by the [place] FDA and  has been authorized for detection and/or diagnosis of SARS-CoV-2 by FDA under an Emergency Use Authorization (EUA). This EUA will remain  in effect (meaning this test can be used) for the duration of the COVID-19 declaration under Section 564(b)(1) of the Act, 21 U.S.C.section 360bbb-3(b)(1), unless the authorization is terminated  or revoked sooner.       Influenza A by PCR NEGATIVE NEGATIVE Final   Influenza B by PCR NEGATIVE NEGATIVE Final    Comment: (NOTE) The Xpert Xpress SARS-CoV-2/FLU/RSV plus assay is intended as an aid in the diagnosis of influenza from Nasopharyngeal swab specimens and should not be used as a sole basis for treatment. Nasal washings and aspirates are unacceptable for Xpert Xpress SARS-CoV-2/FLU/RSV testing.  Fact Sheet for Patients: EntrepreneurPulse.com.au  Fact Sheet for Healthcare Providers: IncredibleEmployment.be  This test is not yet approved or cleared by the [place] FDA and has been authorized for detection and/or diagnosis of SARS-CoV-2 by FDA under an Emergency Use Authorization (EUA). This EUA will remain in effect (meaning this test can be used) for the duration of the COVID-19 declaration under Section 564(b)(1) of the Act, 21 U.S.C. section 360bbb-3(b)(1), unless the authorization is terminated or revoked.  Performed at [facility], [address] [place], [place] [address]   MRSA Next Gen by PCR, Nasal     Status: None   Collection Time: 05/13/22 10:16 PM   Specimen: Nasal Mucosa; Nasal Swab  Result Value Ref Range Status   MRSA by PCR Next Gen NOT DETECTED NOT DETECTED Final    Comment: (NOTE) The GeneXpert MRSA Assay (FDA approved  for NASAL specimens only), is one component of a comprehensive MRSA colonization surveillance program. It is not intended to diagnose MRSA infection nor to guide or monitor treatment for MRSA infections. Test performance is not FDA approved in patients less than 27 years old. Performed at [facility], [address] [place], [place] [address]      Radiology Studies: DG Chest Port 1 View  Result Date: 05/13/2022 CLINICAL DATA:  Shortness of breath, possible sepsis EXAM: PORTABLE CHEST 1 VIEW COMPARISON:  05/10/2022 FINDINGS: Cardiomegaly. Left chest multi lead pacer. Calcified AP window lymph nodes. Very extensive new heterogeneous and consolidative airspace opacity of the right upper lobe. The visualized skeletal structures are unremarkable. IMPRESSION: 1. Very extensive new heterogeneous and consolidative airspace opacity of the right upper lobe, concerning for infection. 2.  Cardiomegaly. Electronically Signed  By: Delanna Ahmadi M.D.   On: 05/13/2022 15:50     Scheduled Meds:  amiodarone  100 mg Oral Q M,W,F   guaiFENesin  600 mg Oral BID   heparin  5,000 Units Subcutaneous Q8H   insulin aspart  0-6 Units Subcutaneous TID WC   ipratropium-albuterol  3 mL Nebulization QID   methylPREDNISolone (SOLU-MEDROL) injection  60 mg Intravenous Q12H   pantoprazole (PROTONIX) IV  40 mg Intravenous Q12H   sucralfate  1 g Oral QID   Continuous Infusions:  ceFEPime (MAXIPIME) IV     [START ON 05/15/2022] vancomycin       LOS: 1 day    Time spent: 34mn    PDomenic Polite MD [facility]   05/14/2022, 10:18 AM

## 2022-05-14 NOTE — Progress Notes (Signed)
Heart rate is too high for accurate echo at this time. 

## 2022-05-14 NOTE — Progress Notes (Signed)
  Echocardiogram 2D Echocardiogram has been performed.  Bobbye Charleston 05/14/2022, 2:07 PM

## 2022-05-15 DIAGNOSIS — J189 Pneumonia, unspecified organism: Secondary | ICD-10-CM | POA: Diagnosis not present

## 2022-05-15 DIAGNOSIS — Y95 Nosocomial condition: Secondary | ICD-10-CM | POA: Diagnosis not present

## 2022-05-15 LAB — CBC
HCT: 31.5 % — ABNORMAL LOW (ref 36.0–46.0)
Hemoglobin: 9.9 g/dL — ABNORMAL LOW (ref 12.0–15.0)
MCH: 29.2 pg (ref 26.0–34.0)
MCHC: 31.4 g/dL (ref 30.0–36.0)
MCV: 92.9 fL (ref 80.0–100.0)
Platelets: 231 10*3/uL (ref 150–400)
RBC: 3.39 MIL/uL — ABNORMAL LOW (ref 3.87–5.11)
RDW: 16.4 % — ABNORMAL HIGH (ref 11.5–15.5)
WBC: 10.4 10*3/uL (ref 4.0–10.5)
nRBC: 0.2 % (ref 0.0–0.2)

## 2022-05-15 LAB — BLOOD CULTURE ID PANEL (REFLEXED) - BCID2

## 2022-05-15 LAB — URINE CULTURE: Culture: 20000 — AB

## 2022-05-15 LAB — BASIC METABOLIC PANEL
Anion gap: 14 (ref 5–15)
BUN: 62 mg/dL — ABNORMAL HIGH (ref 8–23)
CO2: 21 mmol/L — ABNORMAL LOW (ref 22–32)
Calcium: 9.6 mg/dL (ref 8.9–10.3)
Chloride: 96 mmol/L — ABNORMAL LOW (ref 98–111)
Creatinine, Ser: 2.53 mg/dL — ABNORMAL HIGH (ref 0.44–1.00)
GFR, Estimated: 18 mL/min — ABNORMAL LOW (ref >60)
Glucose, Bld: 342 mg/dL — ABNORMAL HIGH (ref 70–99)
Potassium: 4 mmol/L (ref 3.5–5.1)
Sodium: 131 mmol/L — ABNORMAL LOW (ref 135–145)

## 2022-05-15 LAB — GLUCOSE, CAPILLARY
Glucose-Capillary: 292 mg/dL — ABNORMAL HIGH (ref 70–99)
Glucose-Capillary: 302 mg/dL — ABNORMAL HIGH (ref 70–99)
Glucose-Capillary: 276 mg/dL — ABNORMAL HIGH (ref 70–99)
Glucose-Capillary: 149 mg/dL — ABNORMAL HIGH (ref 70–99)

## 2022-05-15 LAB — LEGIONELLA PNEUMOPHILA SEROGP 1 UR AG: L. pneumophila Serogp 1 Ur Ag: NEGATIVE

## 2022-05-15 LAB — PROCALCITONIN: Procalcitonin: 2.81 ng/mL

## 2022-05-15 MED ORDER — FUROSEMIDE 10 MG/ML IJ SOLN
60.0000 mg | Freq: Once | INTRAMUSCULAR | Status: AC
  Administered 2022-05-15: 60 mg via INTRAVENOUS

## 2022-05-15 MED ORDER — FUROSEMIDE 10 MG/ML IJ SOLN
INTRAMUSCULAR | Status: AC
  Filled 2022-05-15: qty 8

## 2022-05-15 MED ORDER — PANTOPRAZOLE 2 MG/ML SUSPENSION
40.0000 mg | Freq: Every day | ORAL | Status: DC
  Administered 2022-05-15 – 2022-05-21 (×6): 40 mg via ORAL
  Filled 2022-05-15 (×7): qty 20

## 2022-05-15 MED ORDER — SODIUM CHLORIDE 0.9 % IV SOLN
2.0000 g | Freq: Three times a day (TID) | INTRAVENOUS | Status: DC
  Administered 2022-05-15 – 2022-05-19 (×12): 2 g via INTRAVENOUS
  Filled 2022-05-15 (×13): qty 2000

## 2022-05-15 MED ORDER — INSULIN DETEMIR 100 UNIT/ML ~~LOC~~ SOLN
25.0000 [IU] | Freq: Every day | SUBCUTANEOUS | Status: DC
  Administered 2022-05-15 – 2022-05-16 (×2): 25 [IU] via SUBCUTANEOUS
  Filled 2022-05-15 (×2): qty 0.25

## 2022-05-15 MED ORDER — INSULIN ASPART 100 UNIT/ML IJ SOLN
5.0000 [IU] | Freq: Three times a day (TID) | INTRAMUSCULAR | Status: DC
  Administered 2022-05-15 – 2022-05-16 (×4): 5 [IU] via SUBCUTANEOUS

## 2022-05-15 NOTE — Inpatient Diabetes Management (Signed)
Inpatient Diabetes Program Recommendations  AACE/ADA: New Consensus Statement on Inpatient Glycemic Control   Target Ranges:  Prepandial:   less than 140 mg/dL      Peak postprandial:   less than 180 mg/dL (1-2 hours)      Critically ill patients:  140 - 180 mg/dL    Latest Reference Range & Units 05/15/22 03:21  Glucose 70 - 99 mg/dL 342 (H)    Latest Reference Range & Units 05/14/22 07:37 05/14/22 13:00 05/14/22 18:31 05/14/22 21:06  Glucose-Capillary 70 - 99 mg/dL 276 (H) 234 (H) 298 (H) 275 (H)   Review of Glycemic Control  Diabetes history: DM2 Outpatient Diabetes medications: None Current orders for Inpatient glycemic control: Levemir 15 units daily, Novolog 3 units TID with meals, Novolog 0-6 units TID with meals; Solumedrol 60 mg Q12H  Inpatient Diabetes Program Recommendations:    Insulin: If steroids are continued as ordered, please consider increasing Levemir to 18 units daily, increasing meal coverage to Novolog 5 units TID with meals, and adding Novolog 0-5 units QHS for bedtime correction.   Thanks, Barnie Alderman, RN, MSN, [place] Diabetes Coordinator Inpatient Diabetes Program [phone] (Team Pager from 8am to [place])

## 2022-05-15 NOTE — Progress Notes (Signed)
PROGRESS NOTE    Krista Mason  PIR:[id number] DOB: Nov 27, 1935 DOA: 05/13/2022 PCP: Lawerance Cruel, MD  86/F with history of chronic combined CHF last echo with EF 50%, paroxysmal A-fib on Eliquis, emphysema, former smoker, type 2 diabetes mellitus, CKD 4 presented to the ED with worsening shortness of breath X 1 week, she did not developed low-grade fever cough and URI last week, then had worsening productive cough was seen at an ER, treated supportively and discharged home, back to the ED with worsening dyspnea, reports mild abdominal distention, no leg swelling, weight is largely unchanged.  Recent admission 04/12/2022 for GI bleed and symptomatic anemia suspected to be from esophagitis and prior esophageal ulcer, treated with supportive care, Eliquis discontinued, seen by cardiology in follow-up recently, decision made not to restart Eliquis on account of recurrent bleeds. In the ED she was hypoxic, 74% on room air, placed on high flow nasal cannula, BNP was 1371, troponin was 48, WBC was 15.3, creatinine was 2.6, chest x-ray noted extensive heterogenous consolidation, airspace opacity in right upper lobe concerning for infection. -Required 20 L high flow [place] on admission, slowly improving on antibiotics steroids nebs and IV Lasix   Subjective: -Continues to have productive cough, starting to feel a little better  Assessment and Plan:  Acute hypoxemic respiratory failure (HCC) Primarily secondary to pneumonia and COPD exacerbation at this time Sepsis poa -Former smoker, emphysema noted on multiple prior CT chest -Required BiPAP on admission, yesterday was on 20 L O2 via HFNC -Continue IV cefepime today, vancomycin discontinued yesterday, PCR is negative, blood cultures 1 out of 2 with coag negative staph consistent with contamination - -follow-up COVID/flu panel -Continue IV steroids today, pulmonary toilet-DuoNebs, flutter valve, Mucinex -Add IV steroids, DuoNebs, flutter valve,  Mucinex -Wean O2 as tolerated, down to 10 L this morning -PT OT eval tomorrow when respiratory status is more stable  Acute on chronic combined systolic and diastolic CHF -Weight unchanged from clinic visit a month ago, BNP elevated>1000 -echo: 09/2021 with EF of 50-55% and grade 1 diastolic dysfunction -Clinically appears close to euvolemic, will repeat IV Lasix x1 to keep net negative  Acute renal failure superimposed on stage 4 chronic kidney disease (HCC) Baseline creatinine 1.9-2.3 and up to 2.6  -Now 2.5, monitor, avoid hypotension  Elevated troponin Likely secondary to demand ischemia  -No ACS  Paroxysmal atrial fibrillation (HCC) With frequent hospitalizations 2/2 GIB, decision was made to stop Eliquis indefinitely-per cardiology follow-up recently -Heart rate elevated, continue carvedilol, amiodarone  Diabetes mellitus with complication (HCC) Y3K of 7.8 in 03/2022 -CBGs elevated while on steroids, increase Levemir, continue meal coverage  Uncontrolled hypertension Blood pressure initially soft, but stable now -Hold hydralazine, restart Coreg -Diuretics  Mixed hyperlipidemia Continue Atorvastatin 20 mg p.o. every other day  Iron deficiency anemia Recurrent GI bleeds Patient receives intermittent iron transfusions outpatient by nephrology, Dr. Hollie Salk.   Anemia panel done during June hospitalization with iron 21, TIBC 314, saturation 7.  She received Ferrlecit '250mg'$  IV daily x 2.  -Hemoglobin stable, monitor  Elevated D-dimer -Clinically do not suspect acute PE, symptoms are secondary to pneumonia/COPD exacerbation -Monitor clinically  DVT prophylaxis: Heparin subcutaneous Code Status: DNR Family Communication: Discussed with patient in detail, no family at bedside Disposition Plan: To be determined, may need SNF  Consultants:    Procedures:   Antimicrobials:    Objective: Vitals:   05/15/22 0313 05/15/22 0805 05/15/22 1129 05/15/22 1139  BP:  (!) 141/74  (!) 141/74 (!) 142/73  Pulse:  (!) 104  100  Resp:  20  18  Temp: 98.5 F (36.9 C) 97.6 F (36.4 C)  97.8 F (36.6 C)  TempSrc: Oral Oral  Oral  SpO2:  98%  94%  Weight:      Height:        Intake/Output Summary (Last 24 hours) at 05/15/2022 1149 Last data filed at 05/14/2022 2200 Gross per 24 hour  Intake 240 ml  Output --  Net 240 ml   Filed Weights   05/13/22 1527  Weight: 65.3 kg    Examination:  General exam: Chronically ill elderly female sitting up in bed, AAO x3, mild distress HEENT: Positive JVD CVS: S1-S2, irregularly irregular rhythm Lungs: Scattered rhonchi and few expiratory wheezes Abdomen: Soft, nontender, bowel sounds present Extremities: No edema Skin: No rashes on exposed skin Psychiatry:  Mood & affect appropriate.     Data Reviewed:   CBC: Recent Labs  Lab 05/10/22 1231 05/13/22 1519 05/13/22 1545 05/14/22 0405 05/15/22 0321  WBC 14.1* 15.3*  --  10.7* 10.4  NEUTROABS 12.5* 13.6*  --   --   --   HGB 10.9* 10.2* 10.9* 10.0* 9.9*  HCT 34.0* 32.8* 32.0* 31.7* 31.5*  MCV 92.6 95.3  --  93.2 92.9  PLT 206 222  --  227 676   Basic Metabolic Panel: Recent Labs  Lab 05/10/22 1231 05/13/22 1519 05/13/22 1545 05/13/22 1836 05/14/22 0405 05/15/22 0321  NA 132* 132* 133*  --  134* 131*  K 3.9 4.2 4.1  --  4.0 4.0  CL 96* 96*  --   --  97* 96*  CO2 26 25  --   --  25 21*  GLUCOSE 311* 293*  --   --  282* 342*  BUN 35* 42*  --   --  45* 62*  CREATININE 2.22* 2.65*  --   --  2.52* 2.53*  CALCIUM 9.6 9.4  --   --  9.3 9.6  MG  --   --   --  2.1  --   --    GFR: Estimated Creatinine Clearance: 14.9 mL/min (A) (by C-G formula based on SCr of 2.53 mg/dL (H)). Liver Function Tests: Recent Labs  Lab 05/10/22 1231 05/13/22 1519  AST 17 20  ALT 9 12  ALKPHOS 66 75  BILITOT 0.8 0.8  PROT 7.1 6.6  ALBUMIN 3.1* 2.5*   Recent Labs  Lab 05/10/22 1231  LIPASE 28   No results for input(s): "AMMONIA" in the last 168 hours. Coagulation  Profile: Recent Labs  Lab 05/13/22 1519  INR 1.2   Cardiac Enzymes: No results for input(s): "CKTOTAL", "CKMB", "CKMBINDEX", "TROPONINI" in the last 168 hours. BNP (last 3 results) No results for input(s): "PROBNP" in the last 8760 hours. HbA1C: No results for input(s): "HGBA1C" in the last 72 hours. CBG: Recent Labs  Lab 05/14/22 1300 05/14/22 1831 05/14/22 2106 05/15/22 0823 05/15/22 1143  GLUCAP 234* 298* 275* 292* 302*   Lipid Profile: No results for input(s): "CHOL", "HDL", "LDLCALC", "TRIG", "CHOLHDL", "LDLDIRECT" in the last 72 hours. Thyroid Function Tests: No results for input(s): "TSH", "T4TOTAL", "FREET4", "T3FREE", "THYROIDAB" in the last 72 hours. Anemia Panel: No results for input(s): "VITAMINB12", "FOLATE", "FERRITIN", "TIBC", "IRON", "RETICCTPCT" in the last 72 hours. Urine analysis:    Component Value Date/Time   COLORURINE YELLOW 05/13/2022 2207   APPEARANCEUR CLEAR 05/13/2022 2207   LABSPEC 1.012 05/13/2022 2207   PHURINE 5.0 05/13/2022 2207   GLUCOSEU NEGATIVE  05/13/2022 2207   [place] 05/13/2022 2207   BILIRUBINUR NEGATIVE 05/13/2022 2207   KETONESUR NEGATIVE 05/13/2022 2207   PROTEINUR NEGATIVE 05/13/2022 2207   UROBILINOGEN 0.2 04/15/2012 1451   NITRITE NEGATIVE 05/13/2022 2207   LEUKOCYTESUR NEGATIVE 05/13/2022 2207   Sepsis Labs: '@LABRCNTIP'$ (procalcitonin:4,lacticidven:4)  ) Recent Results (from the past 240 hour(s))  Resp Panel by RT-PCR (Flu A&B, Covid) Anterior Nasal Swab     Status: None   Collection Time: 05/10/22 12:30 PM   Specimen: Anterior Nasal Swab  Result Value Ref Range Status   SARS Coronavirus 2 by RT PCR NEGATIVE NEGATIVE Final    Comment: (NOTE) SARS-CoV-2 target nucleic acids are NOT DETECTED.  The SARS-CoV-2 RNA is generally detectable in upper respiratory specimens during the acute phase of infection. The lowest concentration of SARS-CoV-2 viral copies this assay can detect is 138 copies/mL. A negative  result does not preclude SARS-Cov-2 infection and should not be used as the sole basis for treatment or other patient management decisions. A negative result may occur with  improper specimen collection/handling, submission of specimen other than nasopharyngeal swab, presence of viral mutation(s) within the areas targeted by this assay, and inadequate number of viral copies(<138 copies/mL). A negative result must be combined with clinical observations, patient history, and epidemiological information. The expected result is Negative.  Fact Sheet for Patients:  EntrepreneurPulse.com.au  Fact Sheet for Healthcare Providers:  IncredibleEmployment.be  This test is no t yet approved or cleared by the [place] FDA and  has been authorized for detection and/or diagnosis of SARS-CoV-2 by FDA under an Emergency Use Authorization (EUA). This EUA will remain  in effect (meaning this test can be used) for the duration of the COVID-19 declaration under Section 564(b)(1) of the Act, 21 U.S.C.section 360bbb-3(b)(1), unless the authorization is terminated  or revoked sooner.       Influenza A by PCR NEGATIVE NEGATIVE Final   Influenza B by PCR NEGATIVE NEGATIVE Final    Comment: (NOTE) The Xpert Xpress SARS-CoV-2/FLU/RSV plus assay is intended as an aid in the diagnosis of influenza from Nasopharyngeal swab specimens and should not be used as a sole basis for treatment. Nasal washings and aspirates are unacceptable for Xpert Xpress SARS-CoV-2/FLU/RSV testing.  Fact Sheet for Patients: EntrepreneurPulse.com.au  Fact Sheet for Healthcare Providers: IncredibleEmployment.be  This test is not yet approved or cleared by the [place] FDA and has been authorized for detection and/or diagnosis of SARS-CoV-2 by FDA under an Emergency Use Authorization (EUA). This EUA will remain in effect (meaning this test can be used)  for the duration of the COVID-19 declaration under Section 564(b)(1) of the Act, 21 U.S.C. section 360bbb-3(b)(1), unless the authorization is terminated or revoked.  Performed at [facility], [address] [place], [place] [address]   Blood Culture (routine x 2)     Status: None (Preliminary result)   Collection Time: 05/13/22  3:24 PM   Specimen: BLOOD  Result Value Ref Range Status   Specimen Description BLOOD SITE NOT SPECIFIED  Final   Special Requests   Final    BOTTLES DRAWN AEROBIC AND ANAEROBIC Blood Culture results may not be optimal due to an inadequate volume of blood received in culture bottles   Culture   Final    NO GROWTH 2 DAYS Performed at [facility], [place] [address], [place], [place] [address]    Report Status PENDING  Incomplete  Blood Culture (routine x 2)  Status: None (Preliminary result)   Collection Time: 05/13/22  3:25 PM   Specimen: BLOOD  Result Value Ref Range Status   Specimen Description BLOOD SITE NOT SPECIFIED  Final   Special Requests   Final    BOTTLES DRAWN AEROBIC AND ANAEROBIC Blood Culture adequate volume   Culture  Setup Time   Final    GRAM POSITIVE COCCI AEROBIC BOTTLE ONLY Organism ID to follow CRITICAL RESULT CALLED TO, READ BACK BY AND VERIFIED WITH: T RUDISILL,PHARMD'@0400'$  05/15/22 [place]    Culture   Final    NO GROWTH 2 DAYS Performed at [facility], [place] [address] [place], [place] [address]    Report Status PENDING  Incomplete  Blood Culture ID Panel (Reflexed)     Status: Abnormal   Collection Time: 05/13/22  3:25 PM  Result Value Ref Range Status   Enterococcus faecalis NOT DETECTED NOT DETECTED Final   Enterococcus Faecium NOT DETECTED NOT DETECTED Final   Listeria monocytogenes NOT DETECTED NOT DETECTED Final   Staphylococcus species DETECTED (A) NOT DETECTED Final    Comment: CRITICAL RESULT CALLED TO, READ BACK BY AND VERIFIED WITH: T RUDISILL,PHARMD'@0400'$  05/15/22 [place]    Staphylococcus  aureus (BCID) NOT DETECTED NOT DETECTED Final   Staphylococcus epidermidis DETECTED (A) NOT DETECTED Final    Comment: CRITICAL RESULT CALLED TO, READ BACK BY AND VERIFIED WITH: T RUDILSILL,PHARMD'@0400'$  05/15/22 [place]    Staphylococcus lugdunensis NOT DETECTED NOT DETECTED Final   Streptococcus species NOT DETECTED NOT DETECTED Final   Streptococcus agalactiae NOT DETECTED NOT DETECTED Final   Streptococcus pneumoniae NOT DETECTED NOT DETECTED Final   Streptococcus pyogenes NOT DETECTED NOT DETECTED Final   A.calcoaceticus-baumannii NOT DETECTED NOT DETECTED Final   Bacteroides fragilis NOT DETECTED NOT DETECTED Final   Enterobacterales NOT DETECTED NOT DETECTED Final   Enterobacter cloacae complex NOT DETECTED NOT DETECTED Final   Escherichia coli NOT DETECTED NOT DETECTED Final   Klebsiella aerogenes NOT DETECTED NOT DETECTED Final   Klebsiella oxytoca NOT DETECTED NOT DETECTED Final   Klebsiella pneumoniae NOT DETECTED NOT DETECTED Final   Proteus species NOT DETECTED NOT DETECTED Final   Salmonella species NOT DETECTED NOT DETECTED Final   Serratia marcescens NOT DETECTED NOT DETECTED Final   Haemophilus influenzae NOT DETECTED NOT DETECTED Final   Neisseria meningitidis NOT DETECTED NOT DETECTED Final   Pseudomonas aeruginosa NOT DETECTED NOT DETECTED Final   Stenotrophomonas maltophilia NOT DETECTED NOT DETECTED Final   Candida albicans NOT DETECTED NOT DETECTED Final   Candida auris NOT DETECTED NOT DETECTED Final   Candida glabrata NOT DETECTED NOT DETECTED Final   Candida krusei NOT DETECTED NOT DETECTED Final   Candida parapsilosis NOT DETECTED NOT DETECTED Final   Candida tropicalis NOT DETECTED NOT DETECTED Final   Cryptococcus neoformans/gattii NOT DETECTED NOT DETECTED Final   Methicillin resistance mecA/C NOT DETECTED NOT DETECTED Final    Comment: Performed at [facility], [address] [place], [place] [address]  Urine Culture     Status: Abnormal   Collection  Time: 05/13/22 10:07 PM   Specimen: In/Out Cath Urine  Result Value Ref Range Status   Specimen Description IN/OUT CATH URINE  Final   Special Requests   Final    NONE Performed at [facility], [place] [address], [place], [place] [address]    Culture 20,000 COLONIES/mL ENTEROCOCCUS FAECALIS (A)  Final   Report Status 05/15/2022 FINAL  Final   Organism ID, Bacteria ENTEROCOCCUS FAECALIS (A)  Final      Susceptibility   Enterococcus faecalis - MIC*    AMPICILLIN <=2 SENSITIVE Sensitive     NITROFURANTOIN <=16 SENSITIVE Sensitive     VANCOMYCIN 1 SENSITIVE Sensitive     * 20,000 COLONIES/mL ENTEROCOCCUS FAECALIS  MRSA Next Gen by PCR, Nasal     Status: None   Collection Time: 05/13/22 10:16 PM   Specimen: Nasal Mucosa; Nasal Swab  Result Value Ref Range Status   MRSA by PCR Next Gen NOT DETECTED NOT DETECTED Final    Comment: (NOTE) The GeneXpert MRSA Assay (FDA approved for NASAL specimens only), is one component of a comprehensive MRSA colonization surveillance program. It is not intended to diagnose MRSA infection nor to guide or monitor treatment for MRSA infections. Test performance is not FDA approved in patients less than 52 years old. Performed at [facility], [place] [address] [place], [place] [address]      Radiology Studies: ECHOCARDIOGRAM COMPLETE  Result Date: 05/14/2022    ECHOCARDIOGRAM REPORT   Patient Name:   Krista Mason Bryden Date of Exam: 05/14/2022 Medical Rec #:  [id number]     Height:       65.0 in Accession #:    [id number]    Weight:       144.0 lb Date of Birth:  October 28, 1935     BSA:          1.720 m Patient Age:    54 years      BP:           125/90 mmHg Patient Gender: F             HR:           87 bpm. Exam Location:  Inpatient Procedure: 2D Echo, 3D Echo, Cardiac Doppler and Color Doppler Indications:    I50.40* Unspecified combined systolic (congestive) and diastolic                 (congestive) heart failure  History:        Patient has prior history  of Echocardiogram examinations, most                 recent 10/12/2021. CHF and Cardiomyopathy, Abnormal ECG and                 Pacemaker, Arrythmias:PVC and ATACH, Signs/Symptoms:Chest Pain;                 Risk Factors:Hypertension, Dyslipidemia, Diabetes and Current                 Smoker.  Sonographer:    Roseanna Rainbow RDCS Referring Phys: [id number] [place]  1. Left ventricular ejection fraction, by estimation, is 40 to 45%. The left ventricle has mildly decreased function. The left ventricle demonstrates global hypokinesis. The left ventricular internal cavity size was mildly dilated. There is mild left ventricular hypertrophy. Left ventricular diastolic parameters are indeterminate.  2. PPM leads in RA/RV . Right ventricular systolic function is normal. The right ventricular size is normal. There is moderately elevated pulmonary artery systolic pressure.  3. Left atrial size was mildly dilated.  4. A small pericardial effusion is present. The pericardial effusion is posterior to the left ventricle.  5. The mitral valve is abnormal. Moderate to severe mitral valve regurgitation. No evidence of mitral stenosis.  6. Tricuspid valve regurgitation is moderate.  7. The aortic valve is tricuspid. There is moderate calcification of the aortic valve. There is  moderate thickening of the aortic valve. Aortic valve regurgitation is mild. Aortic valve sclerosis/calcification is present, without any evidence of aortic stenosis.  8. The inferior vena cava is dilated in size with >50% respiratory variability, suggesting right atrial pressure of 8 mmHg. FINDINGS  Left Ventricle: Left ventricular ejection fraction, by estimation, is 40 to 45%. The left ventricle has mildly decreased function. The left ventricle demonstrates global hypokinesis. The left ventricular internal cavity size was mildly dilated. There is  mild left ventricular hypertrophy. Left ventricular diastolic parameters are indeterminate. Right  Ventricle: PPM leads in RA/RV. The right ventricular size is normal. No increase in right ventricular wall thickness. Right ventricular systolic function is normal. There is moderately elevated pulmonary artery systolic pressure. The tricuspid regurgitant velocity is 3.12 m/s, and with an assumed right atrial pressure of 15 mmHg, the estimated right ventricular systolic pressure is 14.7 mmHg. Left Atrium: Left atrial size was mildly dilated. Right Atrium: Right atrial size was normal in size. Pericardium: A small pericardial effusion is present. The pericardial effusion is posterior to the left ventricle. Mitral Valve: The mitral valve is abnormal. There is moderate thickening of the mitral valve leaflet(s). There is moderate calcification of the mitral valve leaflet(s). Mild mitral annular calcification. Moderate to severe mitral valve regurgitation. No evidence of mitral valve stenosis. MV peak gradient, 15.8 mmHg. The mean mitral valve gradient is 6.0 mmHg. Tricuspid Valve: The tricuspid valve is normal in structure. Tricuspid valve regurgitation is moderate . No evidence of tricuspid stenosis. Aortic Valve: The aortic valve is tricuspid. There is moderate calcification of the aortic valve. There is moderate thickening of the aortic valve. Aortic valve regurgitation is mild. Aortic valve sclerosis/calcification is present, without any evidence of aortic stenosis. Aortic valve mean gradient measures 4.0 mmHg. Aortic valve peak gradient measures 7.4 mmHg. Aortic valve area, by VTI measures 2.61 cm. Pulmonic Valve: The pulmonic valve was normal in structure. Pulmonic valve regurgitation is mild. No evidence of pulmonic stenosis. Aorta: The aortic root is normal in size and structure. Venous: The inferior vena cava is dilated in size with greater than 50% respiratory variability, suggesting right atrial pressure of 8 mmHg. IAS/Shunts: No atrial level shunt detected by color flow Doppler. Additional Comments: A  device lead is visualized.  LEFT VENTRICLE PLAX 2D LVIDd:         3.90 cm LVIDs:         2.80 cm LV PW:         2.20 cm LV IVS:        1.30 cm LVOT diam:     1.90 cm     3D Volume EF: LV SV:         62          3D EF:        34 % LV SV Index:   36          LV EDV:       123 ml LVOT Area:     2.84 cm    LV ESV:       81 ml                            LV SV:        42 ml  LV Volumes (MOD) LV vol d, MOD A2C: 84.0 ml LV vol d, MOD A4C: 93.8 ml LV vol s, MOD A2C: 41.1 ml LV vol s, MOD A4C: 66.6  ml LV SV MOD A2C:     42.9 ml LV SV MOD A4C:     93.8 ml LV SV MOD BP:      36.7 ml RIGHT VENTRICLE             IVC RV S prime:     11.24 cm/s  IVC diam: 2.00 cm TAPSE (M-mode): 1.5 cm LEFT ATRIUM              Index        RIGHT ATRIUM           Index LA diam:        4.00 cm  2.33 cm/m   RA Area:     13.90 cm LA Vol (A2C):   100.0 ml 58.13 ml/m  RA Volume:   33.10 ml  19.24 ml/m LA Vol (A4C):   57.8 ml  33.60 ml/m LA Biplane Vol: 77.1 ml  44.82 ml/m  AORTIC VALVE                    PULMONIC VALVE AV Area (Vmax):    2.48 cm     PV Vmax:          3.03 m/s AV Area (Vmean):   2.52 cm     PV Peak grad:     36.7 mmHg AV Area (VTI):     2.61 cm     PR End Diast Vel: 2.32 msec AV Vmax:           136.00 cm/s AV Vmean:          96.000 cm/s AV VTI:            0.238 m AV Peak Grad:      7.4 mmHg AV Mean Grad:      4.0 mmHg LVOT Vmax:         119.00 cm/s LVOT Vmean:        85.400 cm/s LVOT VTI:          0.219 m LVOT/AV VTI ratio: 0.92  AORTA Ao Root diam: 3.70 cm Ao Asc diam:  3.70 cm MITRAL VALVE                  TRICUSPID VALVE MV Area (PHT): 4.49 cm       TR Peak grad:   38.9 mmHg MV Area VTI:   1.96 cm       TR Vmax:        312.00 cm/s MV Peak grad:  15.8 mmHg MV Mean grad:  6.0 mmHg       SHUNTS MV Vmax:       1.99 m/s       Systemic VTI:  0.22 m MV Vmean:      109.0 cm/s     Systemic Diam: 1.90 cm MV Decel Time: 169 msec MR Peak grad:    107.7 mmHg MR Mean grad:    71.0 mmHg MR Vmax:         519.00 cm/s MR Vmean:        401.0  cm/s MR PISA:         1.27 cm MR PISA Eff ROA: 9 mm MR PISA Radius:  0.45 cm MV E velocity: 176.00 cm/s Jenkins Rouge MD Electronically signed by Jenkins Rouge MD Signature Date/Time: 05/14/2022/2:14:16 PM    Final    DG Chest Port 1 View  Result Date: 05/13/2022 CLINICAL DATA:  Shortness of breath, possible sepsis EXAM: PORTABLE CHEST 1  VIEW COMPARISON:  05/10/2022 FINDINGS: Cardiomegaly. Left chest multi lead pacer. Calcified AP window lymph nodes. Very extensive new heterogeneous and consolidative airspace opacity of the right upper lobe. The visualized skeletal structures are unremarkable. IMPRESSION: 1. Very extensive new heterogeneous and consolidative airspace opacity of the right upper lobe, concerning for infection. 2.  Cardiomegaly. Electronically Signed   By: Delanna Ahmadi M.D.   On: 05/13/2022 15:50     Scheduled Meds:  amiodarone  100 mg Oral Q M,W,F   carvedilol  12.5 mg Oral BID WC   guaiFENesin  600 mg Oral BID   heparin  5,000 Units Subcutaneous Q8H   insulin aspart  0-6 Units Subcutaneous TID WC   insulin aspart  5 Units Subcutaneous TID WC   insulin detemir  25 Units Subcutaneous Daily   ipratropium-albuterol  3 mL Nebulization QID   methylPREDNISolone (SOLU-MEDROL) injection  60 mg Intravenous Q12H   pantoprazole sodium  40 mg Oral Q1200   sucralfate  1 g Oral QID   Continuous Infusions:  ceFEPime (MAXIPIME) IV 2 g (05/14/22 1826)     LOS: 2 days    Time spent: 17mn    PDomenic Polite MD [facility]   05/15/2022, 11:49 AM

## 2022-05-15 NOTE — Progress Notes (Signed)
PHARMACY - PHYSICIAN COMMUNICATION CRITICAL VALUE ALERT - BLOOD CULTURE IDENTIFICATION (BCID)  Krista Mason is an 86 y.o. female who presented to [facility] on 05/13/2022 with a chief complaint of shortness of breath/cough  Assessment: Staphylococcus epidermidis in 1/4  Name of physician (or Provider) Contacted: Hal Hope  Current antibiotics: vancomycin and cefepime  Changes to prescribed antibiotics recommended:  Patient is on recommended antibiotics - No changes needed  Results for orders placed or performed during the hospital encounter of 05/13/22  Blood Culture ID Panel (Reflexed) (Collected: 05/13/2022  3:25 PM)  Result Value Ref Range   Enterococcus faecalis NOT DETECTED NOT DETECTED   Enterococcus Faecium NOT DETECTED NOT DETECTED   Listeria monocytogenes NOT DETECTED NOT DETECTED   Staphylococcus species DETECTED (A) NOT DETECTED   Staphylococcus aureus (BCID) NOT DETECTED NOT DETECTED   Staphylococcus epidermidis DETECTED (A) NOT DETECTED   Staphylococcus lugdunensis NOT DETECTED NOT DETECTED   Streptococcus species NOT DETECTED NOT DETECTED   Streptococcus agalactiae NOT DETECTED NOT DETECTED   Streptococcus pneumoniae NOT DETECTED NOT DETECTED   Streptococcus pyogenes NOT DETECTED NOT DETECTED   A.calcoaceticus-baumannii NOT DETECTED NOT DETECTED   Bacteroides fragilis NOT DETECTED NOT DETECTED   Enterobacterales NOT DETECTED NOT DETECTED   Enterobacter cloacae complex NOT DETECTED NOT DETECTED   Escherichia coli NOT DETECTED NOT DETECTED   Klebsiella aerogenes NOT DETECTED NOT DETECTED   Klebsiella oxytoca NOT DETECTED NOT DETECTED   Klebsiella pneumoniae NOT DETECTED NOT DETECTED   Proteus species NOT DETECTED NOT DETECTED   Salmonella species NOT DETECTED NOT DETECTED   Serratia marcescens NOT DETECTED NOT DETECTED   Haemophilus influenzae NOT DETECTED NOT DETECTED   Neisseria meningitidis NOT DETECTED NOT DETECTED   Pseudomonas aeruginosa NOT DETECTED NOT  DETECTED   Stenotrophomonas maltophilia NOT DETECTED NOT DETECTED   Candida albicans NOT DETECTED NOT DETECTED   Candida auris NOT DETECTED NOT DETECTED   Candida glabrata NOT DETECTED NOT DETECTED   Candida krusei NOT DETECTED NOT DETECTED   Candida parapsilosis NOT DETECTED NOT DETECTED   Candida tropicalis NOT DETECTED NOT DETECTED   Cryptococcus neoformans/gattii NOT DETECTED NOT DETECTED   Methicillin resistance mecA/C NOT DETECTED NOT DETECTED    Nicole Kindred L Joanthony Hamza 05/15/2022  4:06 AM

## 2022-05-15 NOTE — Progress Notes (Signed)
PHARMACY - PHYSICIAN COMMUNICATION CRITICAL VALUE ALERT - BLOOD CULTURE IDENTIFICATION (BCID)  Assessment: 62 YOF presenting 7/23 with progressive SOB with low-grade fever, and productive cough. PMH significant for emphysema.   Suspected source(s): PNA/COPD exacerbation  Culture Result(s): 1/4 bottles with gram positive cocci. Another set later identified as Haemophilus influenzae (beta-lactamase negative)  BCID Result(s): Staph epi on BCID- likely contaminant   Name of physician (or Provider) Contacted: Domenic Polite, MD   Changes to prescribed antibiotics recommended:  STOP Cefepime 2g IV Q24H  START Ampicillin 2g IV Q8H  Recommendation accepted by provider   Adria Dill, PharmD PGY-2 Infectious Diseases Resident  05/15/2022 1:29 PM

## 2022-05-15 NOTE — Care Management (Signed)
  Transition of Care (TOC) Screening Note   Patient Details  Name: Krista Mason Date of Birth: 12/19/1935   Transition of Care [facility]) CM/SW Contact:    Carles Collet, RN Phone Number: 05/15/2022, 4:52 PM    Transition of Care Department [facility]) has reviewed patient and we will continue to monitor patient advancement through interdisciplinary progression rounds.    Admitted from [facility]

## 2022-05-16 DIAGNOSIS — Y95 Nosocomial condition: Secondary | ICD-10-CM | POA: Diagnosis not present

## 2022-05-16 DIAGNOSIS — J189 Pneumonia, unspecified organism: Secondary | ICD-10-CM | POA: Diagnosis not present

## 2022-05-16 LAB — BASIC METABOLIC PANEL
Anion gap: 11 (ref 5–15)
BUN: 73 mg/dL — ABNORMAL HIGH (ref 8–23)
CO2: 26 mmol/L (ref 22–32)
Calcium: 9.7 mg/dL (ref 8.9–10.3)
Chloride: 94 mmol/L — ABNORMAL LOW (ref 98–111)
Creatinine, Ser: 2.43 mg/dL — ABNORMAL HIGH (ref 0.44–1.00)
GFR, Estimated: 19 mL/min — ABNORMAL LOW (ref >60)
Glucose, Bld: 292 mg/dL — ABNORMAL HIGH (ref 70–99)
Potassium: 4.2 mmol/L (ref 3.5–5.1)
Sodium: 131 mmol/L — ABNORMAL LOW (ref 135–145)

## 2022-05-16 LAB — GLUCOSE, CAPILLARY
Glucose-Capillary: 253 mg/dL — ABNORMAL HIGH (ref 70–99)
Glucose-Capillary: 252 mg/dL — ABNORMAL HIGH (ref 70–99)
Glucose-Capillary: 231 mg/dL — ABNORMAL HIGH (ref 70–99)
Glucose-Capillary: 178 mg/dL — ABNORMAL HIGH (ref 70–99)

## 2022-05-16 LAB — CBC
HCT: 30.6 % — ABNORMAL LOW (ref 36.0–46.0)
Hemoglobin: 9.8 g/dL — ABNORMAL LOW (ref 12.0–15.0)
MCH: 29.3 pg (ref 26.0–34.0)
MCHC: 32 g/dL (ref 30.0–36.0)
MCV: 91.6 fL (ref 80.0–100.0)
Platelets: 272 10*3/uL (ref 150–400)
RBC: 3.34 MIL/uL — ABNORMAL LOW (ref 3.87–5.11)
RDW: 16.2 % — ABNORMAL HIGH (ref 11.5–15.5)
WBC: 15.6 10*3/uL — ABNORMAL HIGH (ref 4.0–10.5)
nRBC: 0.1 % (ref 0.0–0.2)

## 2022-05-16 MED ORDER — FUROSEMIDE 10 MG/ML IJ SOLN
60.0000 mg | Freq: Once | INTRAMUSCULAR | Status: AC
  Administered 2022-05-16: 60 mg via INTRAVENOUS
  Filled 2022-05-16: qty 6

## 2022-05-16 MED ORDER — INSULIN ASPART 100 UNIT/ML IJ SOLN
6.0000 [IU] | Freq: Three times a day (TID) | INTRAMUSCULAR | Status: DC
  Administered 2022-05-16 – 2022-05-19 (×8): 6 [IU] via SUBCUTANEOUS

## 2022-05-16 MED ORDER — INSULIN DETEMIR 100 UNIT/ML ~~LOC~~ SOLN
30.0000 [IU] | Freq: Every day | SUBCUTANEOUS | Status: DC
  Administered 2022-05-17 – 2022-05-19 (×3): 30 [IU] via SUBCUTANEOUS
  Filled 2022-05-16 (×3): qty 0.3

## 2022-05-16 MED ORDER — INSULIN DETEMIR 100 UNIT/ML ~~LOC~~ SOLN
10.0000 [IU] | Freq: Once | SUBCUTANEOUS | Status: AC
  Administered 2022-05-16: 10 [IU] via SUBCUTANEOUS
  Filled 2022-05-16: qty 0.1

## 2022-05-16 NOTE — Inpatient Diabetes Management (Signed)
Inpatient Diabetes Program Recommendations  AACE/ADA: New Consensus Statement on Inpatient Glycemic Control   Target Ranges:  Prepandial:   less than 140 mg/dL      Peak postprandial:   less than 180 mg/dL (1-2 hours)      Critically ill patients:  140 - 180 mg/dL    Latest Reference Range & Units 05/15/22 08:23 05/15/22 11:43 05/15/22 15:33 05/15/22 21:23 05/16/22 07:27  Glucose-Capillary 70 - 99 mg/dL 292 (H) 302 (H) 276 (H) 149 (H) 253 (H)   Review of Glycemic Control  Diabetes history: DM2 Outpatient Diabetes medications: None Current orders for Inpatient glycemic control: Levemir 25 units daily, Novolog 5 units TID with meals, Novolog 0-6 units TID with meals; Solumedrol 60 mg Q12H   Inpatient Diabetes Program Recommendations:     Insulin: If steroids are continued as ordered, please consider increasing Levemir to 28 units daily, increasing meal coverage to Novolog 8 units TID with meals, and adding Novolog 0-5 units QHS for bedtime correction.    Thanks, Barnie Alderman, RN, MSN, [place] Diabetes Coordinator Inpatient Diabetes Program [phone] (Team Pager from 8am to [place])

## 2022-05-16 NOTE — Progress Notes (Signed)
PROGRESS NOTE    Krista Mason  EPP:[id number] DOB: [birth year] DOA: 05/13/2022 PCP: Lawerance Cruel, MD  86/F with history of chronic combined CHF last echo with EF 50%, paroxysmal A-fib on Eliquis, emphysema, former smoker, type 2 diabetes mellitus, CKD 4 presented to the ED with worsening shortness of breath X 1 week, she did not developed low-grade fever cough and URI last week, then had worsening productive cough was seen at an ER, treated supportively and discharged home, back to the ED with worsening dyspnea, reports mild abdominal distention, no leg swelling, weight is largely unchanged.  Recent admission 04/12/2022 for GI bleed and symptomatic anemia suspected to be from esophagitis and prior esophageal ulcer, treated with supportive care, Eliquis discontinued, seen by cardiology in follow-up recently, decision made not to restart Eliquis on account of recurrent bleeds. In the ED she was hypoxic, 74% on room air, placed on high flow nasal cannula, BNP was 1371, troponin was 48, WBC was 15.3, creatinine was 2.6, chest x-ray noted extensive heterogenous consolidation, airspace opacity in right upper lobe concerning for infection. -Required 20 L high flow [place] on admission, slowly improving on antibiotics steroids nebs and IV Lasix   Subjective: -Breathing a little better, reports that she is tired of being sick for the last 5 years, her quality of life is terrible  Assessment and Plan:  Acute hypoxemic respiratory failure ([place]) Primarily secondary to pneumonia and COPD exacerbation at this time Sepsis poa -Former smoker, emphysema noted on multiple prior CT chest -Required BiPAP on admission, was on 20 L O2 via HFNC initially -Continue IV cefepime today, vancomycin discontinued yesterday, PCR is negative, blood cultures 1 out of 2 with haemophilus influenza A, antibiotics switched to ampicillin, staph epi and other set is likely contaminant -Slow improvement only, continue IV steroids  again today, pulmonary toilet with DuoNebs, flutter valve, Mucinex -Wean O2 as tolerated, down to 6 L this morning -PT OT eval tomorrow -Out of bed to chair -Patient reports ongoing clinical and functional decline for 4 to 5 years, will request palliative consult for goals of care  Acute on chronic combined systolic and diastolic CHF -Weight unchanged from clinic visit a month ago, BNP elevated>1000 -echo: 09/2021 with EF of 50-55% and grade 1 diastolic dysfunction -Clinically appears close to euvolemic, given IV Lasix for the last 2 days, repeated dose today  Acute renal failure superimposed on stage 4 chronic kidney disease (HCC) Baseline creatinine 1.9-2.3 and up to 2.6  -Now 2.4, monitor, avoid hypotension  Elevated troponin Likely secondary to demand ischemia  -No ACS  Paroxysmal atrial fibrillation ([place]) With frequent hospitalizations 2/2 GIB, decision was made to stop Eliquis indefinitely-per cardiology follow-up recently -Heart rate elevated, continue carvedilol, amiodarone  Diabetes mellitus with complication (HCC) S0Y of 7.8 in 03/2022 -CBGs elevated while on steroids, increase Levemir, increase meal coverage  Uncontrolled hypertension Blood pressure initially soft, but stable now -Hold hydralazine, restarted Coreg -Diuretics  Mixed hyperlipidemia Continue Atorvastatin 20 mg p.o. every other day  Iron deficiency anemia Recurrent GI bleeds Patient receives intermittent iron transfusions outpatient by nephrology, Dr. Hollie Salk.   Anemia panel done during June hospitalization with iron 21, TIBC 314, saturation 7.  She received Ferrlecit '250mg'$  IV daily x 2.  -Hemoglobin stable, monitor  Elevated D-dimer -Clinically do not suspect acute PE, symptoms are secondary to pneumonia/COPD exacerbation -Monitor clinically  DVT prophylaxis: Heparin subcutaneous Code Status: DNR Family Communication: No family at bedside, will update daughter Disposition Plan: Anticipate need for  SNF  Consultants:    Procedures:   Antimicrobials:    Objective: Vitals:   05/16/22 0728 05/16/22 0735 05/16/22 0809 05/16/22 1145  BP: (!) 162/84   127/65  Pulse: (!) 106  (!) 103 (!) 102  Resp: 20  (!) 29 20  Temp:    97.7 F (36.5 C)  TempSrc:    Oral  SpO2: 94% 96% 98% 92%  Weight:      Height:        Intake/Output Summary (Last 24 hours) at 05/16/2022 1200 Last data filed at 05/16/2022 0800 Gross per 24 hour  Intake --  Output 850 ml  Net -850 ml   Filed Weights   05/13/22 1527  Weight: 65.3 kg    Examination:  General exam: Chronically ill female sitting up in bed, AAOx3, less distress HEENT: Positive JVD CVS: S1-S2, irregularly irregular rhythm Lungs: Scattered rhonchi, few expiratory wheezes Abdomen: Soft, nontender, bowel sounds present Extremities: No edema  Skin: No rashes on exposed skin Psychiatry:  Mood & affect appropriate.   Data Reviewed:   CBC: Recent Labs  Lab 05/10/22 1231 05/13/22 1519 05/13/22 1545 05/14/22 0405 05/15/22 0321 05/16/22 0300  WBC 14.1* 15.3*  --  10.7* 10.4 15.6*  NEUTROABS 12.5* 13.6*  --   --   --   --   HGB 10.9* 10.2* 10.9* 10.0* 9.9* 9.8*  HCT 34.0* 32.8* 32.0* 31.7* 31.5* 30.6*  MCV 92.6 95.3  --  93.2 92.9 91.6  PLT 206 222  --  227 231 703   Basic Metabolic Panel: Recent Labs  Lab 05/10/22 1231 05/13/22 1519 05/13/22 1545 05/13/22 1836 05/14/22 0405 05/15/22 0321 05/16/22 0300  NA 132* 132* 133*  --  134* 131* 131*  K 3.9 4.2 4.1  --  4.0 4.0 4.2  CL 96* 96*  --   --  97* 96* 94*  CO2 26 25  --   --  25 21* 26  GLUCOSE 311* 293*  --   --  282* 342* 292*  BUN 35* 42*  --   --  45* 62* 73*  CREATININE 2.22* 2.65*  --   --  2.52* 2.53* 2.43*  CALCIUM 9.6 9.4  --   --  9.3 9.6 9.7  MG  --   --   --  2.1  --   --   --    GFR: Estimated Creatinine Clearance: 15.5 mL/min (A) (by C-G formula based on SCr of 2.43 mg/dL (H)). Liver Function Tests: Recent Labs  Lab 05/10/22 1231 05/13/22 1519   AST 17 20  ALT 9 12  ALKPHOS 66 75  BILITOT 0.8 0.8  PROT 7.1 6.6  ALBUMIN 3.1* 2.5*   Recent Labs  Lab 05/10/22 1231  LIPASE 28   No results for input(s): "AMMONIA" in the last 168 hours. Coagulation Profile: Recent Labs  Lab 05/13/22 1519  INR 1.2   Cardiac Enzymes: No results for input(s): "CKTOTAL", "CKMB", "CKMBINDEX", "TROPONINI" in the last 168 hours. BNP (last 3 results) No results for input(s): "PROBNP" in the last 8760 hours. HbA1C: No results for input(s): "HGBA1C" in the last 72 hours. CBG: Recent Labs  Lab 05/15/22 1143 05/15/22 1533 05/15/22 2123 05/16/22 0727 05/16/22 1147  GLUCAP 302* 276* 149* 253* 252*   Lipid Profile: No results for input(s): "CHOL", "HDL", "LDLCALC", "TRIG", "CHOLHDL", "LDLDIRECT" in the last 72 hours. Thyroid Function Tests: No results for input(s): "TSH", "T4TOTAL", "FREET4", "T3FREE", "THYROIDAB" in the last 72 hours. Anemia Panel: No results for input(s): "  VITAMINB12", "FOLATE", "FERRITIN", "TIBC", "IRON", "RETICCTPCT" in the last 72 hours. Urine analysis:    Component Value Date/Time   COLORURINE YELLOW 05/13/2022 2207   APPEARANCEUR CLEAR 05/13/2022 2207   LABSPEC 1.012 05/13/2022 2207   PHURINE 5.0 05/13/2022 2207   GLUCOSEU NEGATIVE 05/13/2022 2207   HGBUR NEGATIVE 05/13/2022 2207   BILIRUBINUR NEGATIVE 05/13/2022 2207   KETONESUR NEGATIVE 05/13/2022 2207   PROTEINUR NEGATIVE 05/13/2022 2207   UROBILINOGEN 0.2 04/15/2012 1451   NITRITE NEGATIVE 05/13/2022 2207   LEUKOCYTESUR NEGATIVE 05/13/2022 2207   Sepsis Labs: '@LABRCNTIP'$ (procalcitonin:4,lacticidven:4)  ) Recent Results (from the past 240 hour(s))  Resp Panel by RT-PCR (Flu A&B, Covid) Anterior Nasal Swab     Status: None   Collection Time: 05/10/22 12:30 PM   Specimen: Anterior Nasal Swab  Result Value Ref Range Status   SARS Coronavirus 2 by RT PCR NEGATIVE NEGATIVE Final    Comment: (NOTE) SARS-CoV-2 target nucleic acids are NOT DETECTED.  The  SARS-CoV-2 RNA is generally detectable in upper respiratory specimens during the acute phase of infection. The lowest concentration of SARS-CoV-2 viral copies this assay can detect is 138 copies/mL. A negative result does not preclude SARS-Cov-2 infection and should not be used as the sole basis for treatment or other patient management decisions. A negative result may occur with  improper specimen collection/handling, submission of specimen other than nasopharyngeal swab, presence of viral mutation(s) within the areas targeted by this assay, and inadequate number of viral copies(<138 copies/mL). A negative result must be combined with clinical observations, patient history, and epidemiological information. The expected result is Negative.  Fact Sheet for Patients:  EntrepreneurPulse.com.au  Fact Sheet for Healthcare Providers:  IncredibleEmployment.be  This test is no t yet approved or cleared by the [place] FDA and  has been authorized for detection and/or diagnosis of SARS-CoV-2 by FDA under an Emergency Use Authorization (EUA). This EUA will remain  in effect (meaning this test can be used) for the duration of the COVID-19 declaration under Section 564(b)(1) of the Act, 21 U.S.C.section 360bbb-3(b)(1), unless the authorization is terminated  or revoked sooner.       Influenza A by PCR NEGATIVE NEGATIVE Final   Influenza B by PCR NEGATIVE NEGATIVE Final    Comment: (NOTE) The Xpert Xpress SARS-CoV-2/FLU/RSV plus assay is intended as an aid in the diagnosis of influenza from Nasopharyngeal swab specimens and should not be used as a sole basis for treatment. Nasal washings and aspirates are unacceptable for Xpert Xpress SARS-CoV-2/FLU/RSV testing.  Fact Sheet for Patients: EntrepreneurPulse.com.au  Fact Sheet for Healthcare Providers: IncredibleEmployment.be  This test is not yet approved or  cleared by the [place] FDA and has been authorized for detection and/or diagnosis of SARS-CoV-2 by FDA under an Emergency Use Authorization (EUA). This EUA will remain in effect (meaning this test can be used) for the duration of the COVID-19 declaration under Section 564(b)(1) of the Act, 21 U.S.C. section 360bbb-3(b)(1), unless the authorization is terminated or revoked.  Performed at [facility], [address] [place], [place] [address]   Blood Culture (routine x 2)     Status: Abnormal   Collection Time: 05/13/22  3:24 PM   Specimen: BLOOD  Result Value Ref Range Status   Specimen Description BLOOD SITE NOT SPECIFIED  Final   Special Requests   Final    BOTTLES DRAWN AEROBIC AND ANAEROBIC Blood Culture results may not be optimal due to an inadequate volume of blood received in culture  bottles   Culture (A)  Final    HAEMOPHILUS INFLUENZAE BETA LACTAMASE NEGATIVE CRITICAL RESULT CALLED TO, READ BACK BY AND VERIFIED WITH: A. PAYTES PHARMD, AT 0865 05/15/22 D. Allen NOTIFIED Referred to [facility] in [place], [place] for serotyping. Performed at [facility], [place] [address] [place], [place] [address]    Report Status 05/16/2022 FINAL  Final  Blood Culture (routine x 2)     Status: Abnormal (Preliminary result)   Collection Time: 05/13/22  3:25 PM   Specimen: BLOOD  Result Value Ref Range Status   Specimen Description BLOOD SITE NOT SPECIFIED  Final   Special Requests   Final    BOTTLES DRAWN AEROBIC AND ANAEROBIC Blood Culture adequate volume   Culture  Setup Time   Final    GRAM POSITIVE COCCI IN BOTH AEROBIC AND ANAEROBIC BOTTLES CRITICAL RESULT CALLED TO, READ BACK BY AND VERIFIED WITH: T RUDISILL,PHARMD'@0400'$  05/15/22 [place]    Culture (A)  Final    STAPHYLOCOCCUS EPIDERMIDIS THE SIGNIFICANCE OF ISOLATING THIS ORGANISM FROM A SINGLE SET OF BLOOD CULTURES WHEN MULTIPLE SETS ARE DRAWN IS UNCERTAIN. PLEASE NOTIFY THE MICROBIOLOGY  DEPARTMENT WITHIN ONE WEEK IF SPECIATION AND SENSITIVITIES ARE REQUIRED. Performed at [facility], [place] [address], [place], [place] [address]    Report Status PENDING  Incomplete  Blood Culture ID Panel (Reflexed)     Status: Abnormal   Collection Time: 05/13/22  3:25 PM  Result Value Ref Range Status   Enterococcus faecalis NOT DETECTED NOT DETECTED Final   Enterococcus Faecium NOT DETECTED NOT DETECTED Final   Listeria monocytogenes NOT DETECTED NOT DETECTED Final   Staphylococcus species DETECTED (A) NOT DETECTED Final    Comment: CRITICAL RESULT CALLED TO, READ BACK BY AND VERIFIED WITH: T RUDISILL,PHARMD'@0400'$  05/15/22 [place]    Staphylococcus aureus (BCID) NOT DETECTED NOT DETECTED Final   Staphylococcus epidermidis DETECTED (A) NOT DETECTED Final    Comment: CRITICAL RESULT CALLED TO, READ BACK BY AND VERIFIED WITH: T RUDILSILL,PHARMD'@0400'$  05/15/22 [place]    Staphylococcus lugdunensis NOT DETECTED NOT DETECTED Final   Streptococcus species NOT DETECTED NOT DETECTED Final   Streptococcus agalactiae NOT DETECTED NOT DETECTED Final   Streptococcus pneumoniae NOT DETECTED NOT DETECTED Final   Streptococcus pyogenes NOT DETECTED NOT DETECTED Final   A.calcoaceticus-baumannii NOT DETECTED NOT DETECTED Final   Bacteroides fragilis NOT DETECTED NOT DETECTED Final   Enterobacterales NOT DETECTED NOT DETECTED Final   Enterobacter cloacae complex NOT DETECTED NOT DETECTED Final   Escherichia coli NOT DETECTED NOT DETECTED Final   Klebsiella aerogenes NOT DETECTED NOT DETECTED Final   Klebsiella oxytoca NOT DETECTED NOT DETECTED Final   Klebsiella pneumoniae NOT DETECTED NOT DETECTED Final   Proteus species NOT DETECTED NOT DETECTED Final   Salmonella species NOT DETECTED NOT DETECTED Final   Serratia marcescens NOT DETECTED NOT DETECTED Final   Haemophilus influenzae NOT DETECTED NOT DETECTED Final   Neisseria meningitidis NOT DETECTED NOT DETECTED Final   Pseudomonas aeruginosa NOT  DETECTED NOT DETECTED Final   Stenotrophomonas maltophilia NOT DETECTED NOT DETECTED Final   Candida albicans NOT DETECTED NOT DETECTED Final   Candida auris NOT DETECTED NOT DETECTED Final   Candida glabrata NOT DETECTED NOT DETECTED Final   Candida krusei NOT DETECTED NOT DETECTED Final   Candida parapsilosis NOT DETECTED NOT DETECTED Final   Candida tropicalis NOT DETECTED NOT DETECTED Final   Cryptococcus neoformans/gattii NOT DETECTED NOT DETECTED Final   Methicillin resistance mecA/C NOT  DETECTED NOT DETECTED Final    Comment: Performed at [facility], [place] [address], [place], [place] [address]  Urine Culture     Status: Abnormal   Collection Time: 05/13/22 10:07 PM   Specimen: In/Out Cath Urine  Result Value Ref Range Status   Specimen Description IN/OUT CATH URINE  Final   Special Requests   Final    NONE Performed at [facility], [place] [address] [place] [address] [place], [place] [address]    Culture 20,000 COLONIES/mL ENTEROCOCCUS FAECALIS (A)  Final   Report Status 05/15/2022 FINAL  Final   Organism ID, Bacteria ENTEROCOCCUS FAECALIS (A)  Final      Susceptibility   Enterococcus faecalis - MIC*    AMPICILLIN <=2 SENSITIVE Sensitive     NITROFURANTOIN <=16 SENSITIVE Sensitive     VANCOMYCIN 1 SENSITIVE Sensitive     * 20,000 COLONIES/mL ENTEROCOCCUS FAECALIS  MRSA Next Gen by PCR, Nasal     Status: None   Collection Time: 05/13/22 10:16 PM   Specimen: Nasal Mucosa; Nasal Swab  Result Value Ref Range Status   MRSA by PCR Next Gen NOT DETECTED NOT DETECTED Final    Comment: (NOTE) The GeneXpert MRSA Assay (FDA approved for NASAL specimens only), is one component of a comprehensive MRSA colonization surveillance program. It is not intended to diagnose MRSA infection nor to guide or monitor treatment for MRSA infections. Test performance is not FDA approved in patients less than 82 years old. Performed at [facility], [place] [address], [place], [place] [address]       Radiology Studies: ECHOCARDIOGRAM COMPLETE  Result Date: 05/14/2022    ECHOCARDIOGRAM REPORT   Patient Name:   DORIS GRUHN Hone Date of Exam: 05/14/2022 Medical Rec #:  [id number]     Height:       65.0 in Accession #:    [id number]    Weight:       144.0 lb Date of Birth:  [birth year]     BSA:          1.720 m Patient Age:    68 years      BP:           125/90 mmHg Patient Gender: F             HR:           87 bpm. Exam Location:  Inpatient Procedure: 2D Echo, 3D Echo, Cardiac Doppler and Color Doppler Indications:    I50.40* Unspecified combined systolic (congestive) and diastolic                 (congestive) heart failure  History:        Patient has prior history of Echocardiogram examinations, most                 recent 10/12/2021. CHF and Cardiomyopathy, Abnormal ECG and                 Pacemaker, Arrythmias:PVC and ATACH, Signs/Symptoms:Chest Pain;                 Risk Factors:Hypertension, Dyslipidemia, Diabetes and Current                 Smoker.  Sonographer:    Roseanna Rainbow RDCS Referring Phys: [id number] [place]  1. Left ventricular ejection fraction, by estimation, is 40 to 45%. The left ventricle has mildly decreased function. The left ventricle demonstrates global hypokinesis. The left ventricular internal cavity  size was mildly dilated. There is mild left ventricular hypertrophy. Left ventricular diastolic parameters are indeterminate.  2. PPM leads in RA/RV . Right ventricular systolic function is normal. The right ventricular size is normal. There is moderately elevated pulmonary artery systolic pressure.  3. Left atrial size was mildly dilated.  4. A small pericardial effusion is present. The pericardial effusion is posterior to the left ventricle.  5. The mitral valve is abnormal. Moderate to severe mitral valve regurgitation. No evidence of mitral stenosis.  6. Tricuspid valve regurgitation is moderate.  7. The aortic valve is tricuspid. There is moderate calcification of the  aortic valve. There is moderate thickening of the aortic valve. Aortic valve regurgitation is mild. Aortic valve sclerosis/calcification is present, without any evidence of aortic stenosis.  8. The inferior vena cava is dilated in size with >50% respiratory variability, suggesting right atrial pressure of 8 mmHg. FINDINGS  Left Ventricle: Left ventricular ejection fraction, by estimation, is 40 to 45%. The left ventricle has mildly decreased function. The left ventricle demonstrates global hypokinesis. The left ventricular internal cavity size was mildly dilated. There is  mild left ventricular hypertrophy. Left ventricular diastolic parameters are indeterminate. Right Ventricle: PPM leads in RA/RV. The right ventricular size is normal. No increase in right ventricular wall thickness. Right ventricular systolic function is normal. There is moderately elevated pulmonary artery systolic pressure. The tricuspid regurgitant velocity is 3.12 m/s, and with an assumed right atrial pressure of 15 mmHg, the estimated right ventricular systolic pressure is 81.1 mmHg. Left Atrium: Left atrial size was mildly dilated. Right Atrium: Right atrial size was normal in size. Pericardium: A small pericardial effusion is present. The pericardial effusion is posterior to the left ventricle. Mitral Valve: The mitral valve is abnormal. There is moderate thickening of the mitral valve leaflet(s). There is moderate calcification of the mitral valve leaflet(s). Mild mitral annular calcification. Moderate to severe mitral valve regurgitation. No evidence of mitral valve stenosis. MV peak gradient, 15.8 mmHg. The mean mitral valve gradient is 6.0 mmHg. Tricuspid Valve: The tricuspid valve is normal in structure. Tricuspid valve regurgitation is moderate . No evidence of tricuspid stenosis. Aortic Valve: The aortic valve is tricuspid. There is moderate calcification of the aortic valve. There is moderate thickening of the aortic valve. Aortic  valve regurgitation is mild. Aortic valve sclerosis/calcification is present, without any evidence of aortic stenosis. Aortic valve mean gradient measures 4.0 mmHg. Aortic valve peak gradient measures 7.4 mmHg. Aortic valve area, by VTI measures 2.61 cm. Pulmonic Valve: The pulmonic valve was normal in structure. Pulmonic valve regurgitation is mild. No evidence of pulmonic stenosis. Aorta: The aortic root is normal in size and structure. Venous: The inferior vena cava is dilated in size with greater than 50% respiratory variability, suggesting right atrial pressure of 8 mmHg. IAS/Shunts: No atrial level shunt detected by color flow Doppler. Additional Comments: A device lead is visualized.  LEFT VENTRICLE PLAX 2D LVIDd:         3.90 cm LVIDs:         2.80 cm LV PW:         2.20 cm LV IVS:        1.30 cm LVOT diam:     1.90 cm     3D Volume EF: LV SV:         62          3D EF:        34 % LV SV Index:  36          LV EDV:       123 ml LVOT Area:     2.84 cm    LV ESV:       81 ml                            LV SV:        42 ml  LV Volumes (MOD) LV vol d, MOD A2C: 84.0 ml LV vol d, MOD A4C: 93.8 ml LV vol s, MOD A2C: 41.1 ml LV vol s, MOD A4C: 66.6 ml LV SV MOD A2C:     42.9 ml LV SV MOD A4C:     93.8 ml LV SV MOD BP:      36.7 ml RIGHT VENTRICLE             IVC RV S prime:     11.24 cm/s  IVC diam: 2.00 cm TAPSE (M-mode): 1.5 cm LEFT ATRIUM              Index        RIGHT ATRIUM           Index LA diam:        4.00 cm  2.33 cm/m   RA Area:     13.90 cm LA Vol (A2C):   100.0 ml 58.13 ml/m  RA Volume:   33.10 ml  19.24 ml/m LA Vol (A4C):   57.8 ml  33.60 ml/m LA Biplane Vol: 77.1 ml  44.82 ml/m  AORTIC VALVE                    PULMONIC VALVE AV Area (Vmax):    2.48 cm     PV Vmax:          3.03 m/s AV Area (Vmean):   2.52 cm     PV Peak grad:     36.7 mmHg AV Area (VTI):     2.61 cm     PR End Diast Vel: 2.32 msec AV Vmax:           136.00 cm/s AV Vmean:          96.000 cm/s AV VTI:            0.238 m AV  Peak Grad:      7.4 mmHg AV Mean Grad:      4.0 mmHg LVOT Vmax:         119.00 cm/s LVOT Vmean:        85.400 cm/s LVOT VTI:          0.219 m LVOT/AV VTI ratio: 0.92  AORTA Ao Root diam: 3.70 cm Ao Asc diam:  3.70 cm MITRAL VALVE                  TRICUSPID VALVE MV Area (PHT): 4.49 cm       TR Peak grad:   38.9 mmHg MV Area VTI:   1.96 cm       TR Vmax:        312.00 cm/s MV Peak grad:  15.8 mmHg MV Mean grad:  6.0 mmHg       SHUNTS MV Vmax:       1.99 m/s       Systemic VTI:  0.22 m MV Vmean:      109.0 cm/s     Systemic Diam: 1.90 cm MV Decel Time: 169  msec MR Peak grad:    107.7 mmHg MR Mean grad:    71.0 mmHg MR Vmax:         519.00 cm/s MR Vmean:        401.0 cm/s MR PISA:         1.27 cm MR PISA Eff ROA: 9 mm MR PISA Radius:  0.45 cm MV E velocity: 176.00 cm/s Jenkins Rouge MD Electronically signed by Jenkins Rouge MD Signature Date/Time: 05/14/2022/2:14:16 PM    Final      Scheduled Meds:  amiodarone  100 mg Oral Q M,W,F   carvedilol  12.5 mg Oral BID WC   guaiFENesin  600 mg Oral BID   heparin  5,000 Units Subcutaneous Q8H   insulin aspart  0-6 Units Subcutaneous TID WC   insulin aspart  5 Units Subcutaneous TID WC   insulin detemir  25 Units Subcutaneous Daily   ipratropium-albuterol  3 mL Nebulization QID   methylPREDNISolone (SOLU-MEDROL) injection  60 mg Intravenous Q12H   pantoprazole sodium  40 mg Oral Q1200   sucralfate  1 g Oral QID   Continuous Infusions:  ampicillin (OMNIPEN) IV 2 g (05/16/22 1100)     LOS: 3 days    Time spent: 85mn    PDomenic Polite MD [facility]   05/16/2022, 12:00 PM

## 2022-05-16 NOTE — Care Management Important Message (Signed)
Important Message  Patient Details  Name: Krista Mason MRN: [id number] Date of Birth: 07-04-1936   Medicare Important Message Given:  Yes     Orbie Pyo 05/16/2022, 3:28 PM

## 2022-05-17 DIAGNOSIS — J189 Pneumonia, unspecified organism: Secondary | ICD-10-CM | POA: Diagnosis not present

## 2022-05-17 DIAGNOSIS — Y95 Nosocomial condition: Secondary | ICD-10-CM | POA: Diagnosis not present

## 2022-05-17 LAB — GLUCOSE, CAPILLARY
Glucose-Capillary: 187 mg/dL — ABNORMAL HIGH (ref 70–99)
Glucose-Capillary: 209 mg/dL — ABNORMAL HIGH (ref 70–99)
Glucose-Capillary: 240 mg/dL — ABNORMAL HIGH (ref 70–99)
Glucose-Capillary: 172 mg/dL — ABNORMAL HIGH (ref 70–99)

## 2022-05-17 LAB — CBC
HCT: 32.2 % — ABNORMAL LOW (ref 36.0–46.0)
Hemoglobin: 10.1 g/dL — ABNORMAL LOW (ref 12.0–15.0)
MCH: 28.9 pg (ref 26.0–34.0)
MCHC: 31.4 g/dL (ref 30.0–36.0)
MCV: 92 fL (ref 80.0–100.0)
Platelets: 298 10*3/uL (ref 150–400)
RBC: 3.5 MIL/uL — ABNORMAL LOW (ref 3.87–5.11)
RDW: 16.2 % — ABNORMAL HIGH (ref 11.5–15.5)
WBC: 13.6 10*3/uL — ABNORMAL HIGH (ref 4.0–10.5)
nRBC: 0.2 % (ref 0.0–0.2)

## 2022-05-17 LAB — CULTURE, BLOOD (ROUTINE X 2): Special Requests: ADEQUATE

## 2022-05-17 LAB — BASIC METABOLIC PANEL
Anion gap: 11 (ref 5–15)
BUN: 82 mg/dL — ABNORMAL HIGH (ref 8–23)
CO2: 26 mmol/L (ref 22–32)
Calcium: 9.7 mg/dL (ref 8.9–10.3)
Chloride: 99 mmol/L (ref 98–111)
Creatinine, Ser: 2.28 mg/dL — ABNORMAL HIGH (ref 0.44–1.00)
GFR, Estimated: 21 mL/min — ABNORMAL LOW (ref >60)
Glucose, Bld: 193 mg/dL — ABNORMAL HIGH (ref 70–99)
Potassium: 3.7 mmol/L (ref 3.5–5.1)
Sodium: 136 mmol/L (ref 135–145)

## 2022-05-17 MED ORDER — METHYLPREDNISOLONE SODIUM SUCC 40 MG IJ SOLR
40.0000 mg | Freq: Two times a day (BID) | INTRAMUSCULAR | Status: AC
  Administered 2022-05-17: 40 mg via INTRAVENOUS
  Filled 2022-05-17: qty 1

## 2022-05-17 MED ORDER — FUROSEMIDE 10 MG/ML IJ SOLN
60.0000 mg | Freq: Once | INTRAMUSCULAR | Status: AC
  Administered 2022-05-17: 60 mg via INTRAVENOUS
  Filled 2022-05-17: qty 6

## 2022-05-17 MED ORDER — PREDNISONE 20 MG PO TABS
40.0000 mg | ORAL_TABLET | Freq: Every day | ORAL | Status: DC
  Administered 2022-05-18 – 2022-05-19 (×2): 40 mg via ORAL
  Filled 2022-05-17 (×2): qty 2

## 2022-05-17 NOTE — Progress Notes (Addendum)
PROGRESS NOTE    ESSENCE MERLE  SWH:[id number] DOB: 05-Jan-1936 DOA: 05/13/2022 PCP: Lawerance Cruel, MD  86/F with history of chronic combined CHF last echo with EF 50%, paroxysmal A-fib on Eliquis, emphysema, former smoker, type 2 diabetes mellitus, CKD 4 presented to the ED with worsening shortness of breath X 1 week, she did developed low-grade fever cough and URI last week, then had worsening productive cough was seen at an ER, treated supportively and discharged home, back to the ED with worsening dyspnea.   Recent admission 04/12/2022 for GI bleed and symptomatic anemia suspected to be from esophagitis and prior esophageal ulcer, treated with supportive care, Eliquis discontinued, seen by cardiology in follow-up recently, decision made not to restart Eliquis on account of recurrent bleeds. In the ED she was hypoxic, 74% on room air, placed on high flow nasal cannula, BNP was 1371, troponin was 48, WBC was 15.3, creatinine was 2.6, chest x-ray noted extensive heterogenous consolidation, airspace opacity in right upper lobe concerning for infection. -Required 20 L high flow [place] on admission, slowly improving on antibiotics steroids nebs and IV Lasix   Subjective: -Feels better today, breathing slowly improving, down to 5 L O2  Assessment and Plan:  Acute hypoxemic respiratory failure (HCC) Primarily secondary to pneumonia and COPD exacerbation at this time Sepsis poa -Former smoker, emphysema noted on multiple prior CT chest -Required BiPAP on admission, was on 20 L O2 via HFNC initially -Treated initially with cefepime, briefly vancomycin as well, blood cultures 1 out of 2 with haemophilus influenza A, antibiotics switched to ampicillin, staph epi and other set is likely contaminant -Continues to have slow improvement, cut down IV steroids will transition to prednisone tomorrow, pulmonary toilet with DuoNebs, flutter valve, Mucinex -Wean O2 as tolerated, down to 5 L -PT OT eval  pending -Patient reports ongoing clinical and functional decline for 4 to 5 years, will request palliative consult for goals of care  Acute on chronic combined systolic and diastolic CHF -Weight unchanged from clinic visit a month ago, BNP elevated>1000 -echo: 09/2021 with EF of 50-55% and grade 1 diastolic dysfunction, EF 66-59% now -Clinically appears close to euvolemic, repeated dose of IV Lasix today, has received intermittent IV diuretics this admission  Acute renal failure superimposed on stage 4 chronic kidney disease (HCC) Baseline creatinine 1.9-2.3 and up to 2.6  -Now 2.2, avoid hypotension  Elevated troponin Likely secondary to demand ischemia  -No ACS  Paroxysmal atrial fibrillation ([place]) With frequent hospitalizations 2/2 GIB, decision was made to stop Eliquis indefinitely-per cardiology follow-up recently -Heart rate elevated, continue carvedilol, amiodarone  Diabetes mellitus with complication (HCC) D3T of 7.8 in 03/2022 -CBGs elevated while on steroids, increase Levemir and meal coverage yesterday  Uncontrolled hypertension Blood pressure initially soft, but stable now -Restart hydralazine, continue Coreg -Diuretics  Mixed hyperlipidemia Continue Atorvastatin 20 mg p.o. every other day  Iron deficiency anemia Recurrent GI bleeds Patient receives intermittent iron transfusions outpatient by nephrology, Dr. Hollie Salk.   Anemia panel done during June hospitalization with iron 21, TIBC 314, saturation 7.  She received Ferrlecit '250mg'$  IV daily x 2.  -Hemoglobin stable, monitor  Elevated D-dimer -Clinically do not suspect acute PE, symptoms are secondary to pneumonia/COPD exacerbation, clinically improving -Monitor clinically  DVT prophylaxis: Heparin subcutaneous Code Status: DNR Family Communication: No family at bedside, called and updated daughter 7/26 Disposition Plan: Anticipate need for SNF  Consultants: Palliative care   Procedures:   Antimicrobials:     Objective: Vitals:  05/17/22 0424 05/17/22 0500 05/17/22 0745 05/17/22 0824  BP: (!) 154/75  (!) 157/80   Pulse: (!) 104  (!) 105   Resp: 20  20   Temp: 98.4 F (36.9 C)  97.7 F (36.5 C)   TempSrc: Oral  Oral   SpO2: 93%   94%  Weight:  67.4 kg    Height:        Intake/Output Summary (Last 24 hours) at 05/17/2022 1133 Last data filed at 05/17/2022 0500 Gross per 24 hour  Intake --  Output 1550 ml  Net -1550 ml   Filed Weights   05/13/22 1527 05/17/22 0500  Weight: 65.3 kg 67.4 kg    Examination:  General exam: Chronically ill female sitting up in bed, AAOx3, less distress today HEENT: Positive JVD CVS: S1-S2, regular rhythm today Lungs: Few scattered rhonchi, no expiratory wheezes Abdomen: Soft, nontender, bowel sounds present Extremities: No edema Skin: No rashes on exposed skin Psychiatry:  Mood & affect appropriate.   Data Reviewed:   CBC: Recent Labs  Lab 05/10/22 1231 05/13/22 1519 05/13/22 1545 05/14/22 0405 05/15/22 0321 05/16/22 0300 05/17/22 0350  WBC 14.1* 15.3*  --  10.7* 10.4 15.6* 13.6*  NEUTROABS 12.5* 13.6*  --   --   --   --   --   HGB 10.9* 10.2* 10.9* 10.0* 9.9* 9.8* 10.1*  HCT 34.0* 32.8* 32.0* 31.7* 31.5* 30.6* 32.2*  MCV 92.6 95.3  --  93.2 92.9 91.6 92.0  PLT 206 222  --  227 231 272 767   Basic Metabolic Panel: Recent Labs  Lab 05/13/22 1519 05/13/22 1545 05/13/22 1836 05/14/22 0405 05/15/22 0321 05/16/22 0300 05/17/22 0350  NA 132* 133*  --  134* 131* 131* 136  K 4.2 4.1  --  4.0 4.0 4.2 3.7  CL 96*  --   --  97* 96* 94* 99  CO2 25  --   --  25 21* 26 26  GLUCOSE 293*  --   --  282* 342* 292* 193*  BUN 42*  --   --  45* 62* 73* 82*  CREATININE 2.65*  --   --  2.52* 2.53* 2.43* 2.28*  CALCIUM 9.4  --   --  9.3 9.6 9.7 9.7  MG  --   --  2.1  --   --   --   --    GFR: Estimated Creatinine Clearance: 16.5 mL/min (A) (by C-G formula based on SCr of 2.28 mg/dL (H)). Liver Function Tests: Recent Labs  Lab  05/10/22 1231 05/13/22 1519  AST 17 20  ALT 9 12  ALKPHOS 66 75  BILITOT 0.8 0.8  PROT 7.1 6.6  ALBUMIN 3.1* 2.5*   Recent Labs  Lab 05/10/22 1231  LIPASE 28   No results for input(s): "AMMONIA" in the last 168 hours. Coagulation Profile: Recent Labs  Lab 05/13/22 1519  INR 1.2   Cardiac Enzymes: No results for input(s): "CKTOTAL", "CKMB", "CKMBINDEX", "TROPONINI" in the last 168 hours. BNP (last 3 results) No results for input(s): "PROBNP" in the last 8760 hours. HbA1C: No results for input(s): "HGBA1C" in the last 72 hours. CBG: Recent Labs  Lab 05/16/22 0727 05/16/22 1147 05/16/22 1555 05/16/22 2156 05/17/22 0747  GLUCAP 253* 252* 231* 178* 187*   Lipid Profile: No results for input(s): "CHOL", "HDL", "LDLCALC", "TRIG", "CHOLHDL", "LDLDIRECT" in the last 72 hours. Thyroid Function Tests: No results for input(s): "TSH", "T4TOTAL", "FREET4", "T3FREE", "THYROIDAB" in the last 72 hours. Anemia Panel: No  results for input(s): "VITAMINB12", "FOLATE", "FERRITIN", "TIBC", "IRON", "RETICCTPCT" in the last 72 hours. Urine analysis:    Component Value Date/Time   COLORURINE YELLOW 05/13/2022 2207   APPEARANCEUR CLEAR 05/13/2022 2207   LABSPEC 1.012 05/13/2022 2207   PHURINE 5.0 05/13/2022 2207   GLUCOSEU NEGATIVE 05/13/2022 2207   HGBUR NEGATIVE 05/13/2022 2207   BILIRUBINUR NEGATIVE 05/13/2022 2207   KETONESUR NEGATIVE 05/13/2022 2207   PROTEINUR NEGATIVE 05/13/2022 2207   UROBILINOGEN 0.2 04/15/2012 1451   NITRITE NEGATIVE 05/13/2022 2207   LEUKOCYTESUR NEGATIVE 05/13/2022 2207   Sepsis Labs: '@LABRCNTIP'$ (procalcitonin:4,lacticidven:4)  ) Recent Results (from the past 240 hour(s))  Resp Panel by RT-PCR (Flu A&B, Covid) Anterior Nasal Swab     Status: None   Collection Time: 05/10/22 12:30 PM   Specimen: Anterior Nasal Swab  Result Value Ref Range Status   SARS Coronavirus 2 by RT PCR NEGATIVE NEGATIVE Final    Comment: (NOTE) SARS-CoV-2 target nucleic  acids are NOT DETECTED.  The SARS-CoV-2 RNA is generally detectable in upper respiratory specimens during the acute phase of infection. The lowest concentration of SARS-CoV-2 viral copies this assay can detect is 138 copies/mL. A negative result does not preclude SARS-Cov-2 infection and should not be used as the sole basis for treatment or other patient management decisions. A negative result may occur with  improper specimen collection/handling, submission of specimen other than nasopharyngeal swab, presence of viral mutation(s) within the areas targeted by this assay, and inadequate number of viral copies(<138 copies/mL). A negative result must be combined with clinical observations, patient history, and epidemiological information. The expected result is Negative.  Fact Sheet for Patients:  EntrepreneurPulse.com.au  Fact Sheet for Healthcare Providers:  IncredibleEmployment.be  This test is no t yet approved or cleared by the [place] FDA and  has been authorized for detection and/or diagnosis of SARS-CoV-2 by FDA under an Emergency Use Authorization (EUA). This EUA will remain  in effect (meaning this test can be used) for the duration of the COVID-19 declaration under Section 564(b)(1) of the Act, 21 U.S.C.section 360bbb-3(b)(1), unless the authorization is terminated  or revoked sooner.       Influenza A by PCR NEGATIVE NEGATIVE Final   Influenza B by PCR NEGATIVE NEGATIVE Final    Comment: (NOTE) The Xpert Xpress SARS-CoV-2/FLU/RSV plus assay is intended as an aid in the diagnosis of influenza from Nasopharyngeal swab specimens and should not be used as a sole basis for treatment. Nasal washings and aspirates are unacceptable for Xpert Xpress SARS-CoV-2/FLU/RSV testing.  Fact Sheet for Patients: EntrepreneurPulse.com.au  Fact Sheet for Healthcare Providers: IncredibleEmployment.be  This  test is not yet approved or cleared by the [place] FDA and has been authorized for detection and/or diagnosis of SARS-CoV-2 by FDA under an Emergency Use Authorization (EUA). This EUA will remain in effect (meaning this test can be used) for the duration of the COVID-19 declaration under Section 564(b)(1) of the Act, 21 U.S.C. section 360bbb-3(b)(1), unless the authorization is terminated or revoked.  Performed at [facility], [address] [place], [place] [address]   Blood Culture (routine x 2)     Status: Abnormal   Collection Time: 05/13/22  3:24 PM   Specimen: BLOOD  Result Value Ref Range Status   Specimen Description BLOOD SITE NOT SPECIFIED  Final   Special Requests   Final    BOTTLES DRAWN AEROBIC AND ANAEROBIC Blood Culture results may not be optimal due to an inadequate volume of blood  received in culture bottles   Culture (A)  Final    HAEMOPHILUS INFLUENZAE BETA LACTAMASE NEGATIVE CRITICAL RESULT CALLED TO, READ BACK BY AND VERIFIED WITH: A. PAYTES PHARMD, AT 2440 05/15/22 [place] NOTIFIED Referred to [facility] in [place], [place] for serotyping. Performed at [facility], [place] [address] [place], [place] [address]    Report Status 05/16/2022 FINAL  Final  Blood Culture (routine x 2)     Status: Abnormal   Collection Time: 05/13/22  3:25 PM   Specimen: BLOOD  Result Value Ref Range Status   Specimen Description BLOOD SITE NOT SPECIFIED  Final   Special Requests   Final    BOTTLES DRAWN AEROBIC AND ANAEROBIC Blood Culture adequate volume   Culture  Setup Time   Final    GRAM POSITIVE COCCI IN BOTH AEROBIC AND ANAEROBIC BOTTLES CRITICAL RESULT CALLED TO, READ BACK BY AND VERIFIED WITH: T RUDISILL,PHARMD'@0400'$  05/15/22 [place]    Culture (A)  Final    STAPHYLOCOCCUS EPIDERMIDIS THE SIGNIFICANCE OF ISOLATING THIS ORGANISM FROM A SINGLE SET OF BLOOD CULTURES WHEN MULTIPLE SETS ARE DRAWN IS UNCERTAIN. PLEASE NOTIFY THE  MICROBIOLOGY DEPARTMENT WITHIN ONE WEEK IF SPECIATION AND SENSITIVITIES ARE REQUIRED. Performed at [facility], [place] [address] [place], [place] [address]    Report Status 05/17/2022 FINAL  Final  Blood Culture ID Panel (Reflexed)     Status: Abnormal   Collection Time: 05/13/22  3:25 PM  Result Value Ref Range Status   Enterococcus faecalis NOT DETECTED NOT DETECTED Final   Enterococcus Faecium NOT DETECTED NOT DETECTED Final   Listeria monocytogenes NOT DETECTED NOT DETECTED Final   Staphylococcus species DETECTED (A) NOT DETECTED Final    Comment: CRITICAL RESULT CALLED TO, READ BACK BY AND VERIFIED WITH: T RUDISILL,PHARMD'@0400'$  05/15/22 [place]    Staphylococcus aureus (BCID) NOT DETECTED NOT DETECTED Final   Staphylococcus epidermidis DETECTED (A) NOT DETECTED Final    Comment: CRITICAL RESULT CALLED TO, READ BACK BY AND VERIFIED WITH: T RUDILSILL,PHARMD'@0400'$  05/15/22 [place]    Staphylococcus lugdunensis NOT DETECTED NOT DETECTED Final   Streptococcus species NOT DETECTED NOT DETECTED Final   Streptococcus agalactiae NOT DETECTED NOT DETECTED Final   Streptococcus pneumoniae NOT DETECTED NOT DETECTED Final   Streptococcus pyogenes NOT DETECTED NOT DETECTED Final   A.calcoaceticus-baumannii NOT DETECTED NOT DETECTED Final   Bacteroides fragilis NOT DETECTED NOT DETECTED Final   Enterobacterales NOT DETECTED NOT DETECTED Final   Enterobacter cloacae complex NOT DETECTED NOT DETECTED Final   Escherichia coli NOT DETECTED NOT DETECTED Final   Klebsiella aerogenes NOT DETECTED NOT DETECTED Final   Klebsiella oxytoca NOT DETECTED NOT DETECTED Final   Klebsiella pneumoniae NOT DETECTED NOT DETECTED Final   Proteus species NOT DETECTED NOT DETECTED Final   Salmonella species NOT DETECTED NOT DETECTED Final   Serratia marcescens NOT DETECTED NOT DETECTED Final   Haemophilus influenzae NOT DETECTED NOT DETECTED Final   Neisseria meningitidis NOT DETECTED NOT DETECTED Final   Pseudomonas  aeruginosa NOT DETECTED NOT DETECTED Final   Stenotrophomonas maltophilia NOT DETECTED NOT DETECTED Final   Candida albicans NOT DETECTED NOT DETECTED Final   Candida auris NOT DETECTED NOT DETECTED Final   Candida glabrata NOT DETECTED NOT DETECTED Final   Candida krusei NOT DETECTED NOT DETECTED Final   Candida parapsilosis NOT DETECTED NOT DETECTED Final   Candida tropicalis NOT DETECTED NOT DETECTED Final   Cryptococcus neoformans/gattii NOT DETECTED NOT DETECTED Final   Methicillin resistance  mecA/C NOT DETECTED NOT DETECTED Final    Comment: Performed at [facility], [place] [address], [place], [place] [address]  Urine Culture     Status: Abnormal   Collection Time: 05/13/22 10:07 PM   Specimen: In/Out Cath Urine  Result Value Ref Range Status   Specimen Description IN/OUT CATH URINE  Final   Special Requests   Final    NONE Performed at [facility], [place] [address] [place], [place] [address]    Culture 20,000 COLONIES/mL ENTEROCOCCUS FAECALIS (A)  Final   Report Status 05/15/2022 FINAL  Final   Organism ID, Bacteria ENTEROCOCCUS FAECALIS (A)  Final      Susceptibility   Enterococcus faecalis - MIC*    AMPICILLIN <=2 SENSITIVE Sensitive     NITROFURANTOIN <=16 SENSITIVE Sensitive     VANCOMYCIN 1 SENSITIVE Sensitive     * 20,000 COLONIES/mL ENTEROCOCCUS FAECALIS  MRSA Next Gen by PCR, Nasal     Status: None   Collection Time: 05/13/22 10:16 PM   Specimen: Nasal Mucosa; Nasal Swab  Result Value Ref Range Status   MRSA by PCR Next Gen NOT DETECTED NOT DETECTED Final    Comment: (NOTE) The GeneXpert MRSA Assay (FDA approved for NASAL specimens only), is one component of a comprehensive MRSA colonization surveillance program. It is not intended to diagnose MRSA infection nor to guide or monitor treatment for MRSA infections. Test performance is not FDA approved in patients less than 33 years old. Performed at [facility], [place] [address] [place],  [place] [address]      Radiology Studies: No results found.   Scheduled Meds:  amiodarone  100 mg Oral Q M,W,F   carvedilol  12.5 mg Oral BID WC   guaiFENesin  600 mg Oral BID   heparin  5,000 Units Subcutaneous Q8H   insulin aspart  0-6 Units Subcutaneous TID WC   insulin aspart  6 Units Subcutaneous TID WC   insulin detemir  30 Units Subcutaneous Daily   ipratropium-albuterol  3 mL Nebulization QID   methylPREDNISolone (SOLU-MEDROL) injection  60 mg Intravenous Q12H   pantoprazole sodium  40 mg Oral Q1200   sucralfate  1 g Oral QID   Continuous Infusions:  ampicillin (OMNIPEN) IV 2 g (05/17/22 0850)     LOS: 4 days    Time spent: 41mn    PDomenic Polite MD [facility]   05/17/2022, 11:33 AM

## 2022-05-17 NOTE — Evaluation (Signed)
Occupational Therapy Evaluation Patient Details Name: Krista Mason MRN: [id number] DOB: [birth year] Today's Date: 05/17/2022   History of Present Illness 86 yo female returns to [facility] on 7/23 for onset of SOB with worsening, fever, cough and emesis.  She is dx with PNA sepsis, respiratory failure, CHF, elevated troponin, and is in acute renal failure.  Previously admitted 6/22 for transfusion after esophageal ulcer, off Eliquis; mild HTN.  PMHx:  HTN, CHF, PAF, CKD4, anemia, Covid, HLD,   Clinical Impression   Pt lives alone, uses a w/c and rollator in her apartment and typically walks to the dining room. She functions modified independently in ADLs and is dependent in housekeeping and meal prep. Pt presents with decreased activity tolerance on 5L 02, generalized weakness and impaired standing balance. She needs up to moderate assistance for mobility and set up to min assist for ADLs. Pt prefers to go home at discharge and may be able to set up assistance. Recommending SNF meantime.      Recommendations for follow up therapy are one component of a multi-disciplinary discharge planning process, led by the attending physician.  Recommendations may be updated based on patient status, additional functional criteria and insurance authorization.   Follow Up Recommendations  Skilled nursing-short term rehab (<3 hours/day)    Assistance Recommended at Discharge Frequent or constant Supervision/Assistance  Patient can return home with the following A lot of help with walking and/or transfers;A little help with bathing/dressing/bathroom;Assistance with cooking/housework;Assist for transportation;Help with stairs or ramp for entrance    Functional Status Assessment  Patient has had a recent decline in their functional status and demonstrates the ability to make significant improvements in function in a reasonable and predictable amount of time.  Equipment Recommendations  None recommended by OT     Recommendations for Other Services       Precautions / Restrictions Precautions Precautions: Fall Precaution Comments: monitor HR and O2 sats Restrictions Weight Bearing Restrictions: No      Mobility Bed Mobility                    Transfers Overall transfer level: Needs assistance Equipment used: Rolling walker (2 wheels), 1 person hand held assist Transfers: Sit to/from Stand Sit to Stand: Mod assist     Step pivot transfers: Min assist     General transfer comment: mod to power up then min to sidestep to chair      Balance Overall balance assessment: Needs assistance Sitting-balance support: Feet supported Sitting balance-Leahy Scale: Fair     Standing balance support: Bilateral upper extremity supported, During functional activity Standing balance-Leahy Scale: Poor                             ADL either performed or assessed with clinical judgement   ADL Overall ADL's : Needs assistance/impaired Eating/Feeding: Independent;Sitting   Grooming: Set up;Sitting   Upper Body Bathing: Set up;Sitting   Lower Body Bathing: Minimal assistance;Sit to/from stand   Upper Body Dressing : Set up;Sitting   Lower Body Dressing: Minimal assistance;Sit to/from stand       Toileting- Water quality scientist and Hygiene: Minimal assistance;Sit to/from stand       Functional mobility during ADLs: Minimal assistance;Rolling walker (2 wheels) General ADL Comments: Pt is well versed in energy conservation strategies and pursed lip breathing.     Vision Ability to See in Adequate Light: 0 Adequate Patient Visual Report: No change from baseline  Perception     Praxis      Pertinent Vitals/Pain Pain Assessment Pain Assessment: No/denies pain     Hand Dominance Left   Extremity/Trunk Assessment Upper Extremity Assessment Upper Extremity Assessment: Generalized weakness;Overall [facility] for tasks assessed   Lower Extremity  Assessment Lower Extremity Assessment: Defer to PT evaluation   Cervical / Trunk Assessment Cervical / Trunk Assessment: Kyphotic   Communication Communication Communication: No difficulties   Cognition Arousal/Alertness: Awake/alert Behavior During Therapy: WFL for tasks assessed/performed Overall Cognitive Status: Within Functional Limits for tasks assessed                                       General Comments  Pt is up to walk to chair only, unable to take steps away from transition to chair over fatigue.  Desatting before PT started to move her on 4L at times, and intransition dropped to 85%. Had to have 5L to return to 93% at rest, but is variable    Exercises     Shoulder Instructions      Home Living Family/patient expects to be discharged to:: Private residence Living Arrangements: Alone Available Help at Discharge: Friend(s);Available PRN/intermittently;Family Type of Home: Apartment Home Access: Level entry;Elevator     Home Layout: One level     Bathroom Shower/Tub: Teacher, early years/pre: Handicapped height     Home Equipment: Conservation officer, nature (2 wheels);Rollator (4 wheels);Wheelchair - manual;Shower seat;Grab bars - toilet;Grab bars - tub/shower;Hand held shower head          Prior Functioning/Environment Prior Level of Function : Independent/Modified Independent             Mobility Comments: Rollator or w/c in apartment, rollator to dining room ADLs Comments: facility does meals and laundry, pt sits to shower        OT Problem List: Decreased activity tolerance;Impaired balance (sitting and/or standing);Cardiopulmonary status limiting activity;Decreased strength      OT Treatment/Interventions: Self-care/ADL training;DME and/or AE instruction;Patient/family education;Balance training;Therapeutic activities    OT Goals(Current goals can be found in the care plan section) Acute Rehab OT Goals OT Goal Formulation:  With patient Time For Goal Achievement: 05/31/22 Potential to Achieve Goals: Fair ADL Goals Pt Will Perform Grooming: with supervision;standing Pt Will Perform Lower Body Bathing: with supervision;sit to/from stand Pt Will Perform Lower Body Dressing: with supervision;sit to/from stand Pt Will Transfer to Toilet: with supervision;ambulating;bedside commode Pt Will Perform Toileting - Clothing Manipulation and hygiene: with supervision;sit to/from stand  OT Frequency: Min 2X/week    Co-evaluation              AM-PAC OT "6 Clicks" Daily Activity     Outcome Measure Help from another person eating meals?: None Help from another person taking care of personal grooming?: A Little Help from another person toileting, which includes using toliet, bedpan, or urinal?: A Little Help from another person bathing (including washing, rinsing, drying)?: A Little Help from another person to put on and taking off regular upper body clothing?: A Little Help from another person to put on and taking off regular lower body clothing?: A Little 6 Click Score: 19   End of Session Equipment Utilized During Treatment: Rolling walker (2 wheels);Gait belt;Oxygen  Activity Tolerance: Patient tolerated treatment well Patient left: in chair;with call bell/phone within reach;with chair alarm set  OT Visit Diagnosis: Unsteadiness on feet (R26.81);Other abnormalities of gait and  mobility (R26.89);Muscle weakness (generalized) (M62.81);Other (comment) (decreased activity tolerance)                Time: 2575-0518 OT Time Calculation (min): 21 min Charges:  OT General Charges $OT Visit: 1 Visit OT Evaluation $OT Eval Moderate Complexity: [place], OTR/L Acute Rehabilitation Services Office: [phone]  Malka So 05/17/2022, 3:21 PM

## 2022-05-17 NOTE — NC FL2 (Signed)
[place] MEDICAID FL2 LEVEL OF CARE SCREENING TOOL     IDENTIFICATION  Patient Name: Krista Mason Birthdate: 02-15-1936 Sex: female Admission Date (Current Location): 05/13/2022  [facility] and [place] Number:  Herbalist and Address:  [facility] [place], [place] [address] [place], [place] [address]      Provider Number: [id number]  Attending Physician Name and Address:  Domenic Polite, MD  Relative Name and Phone Number:       Current Level of Care: Hospital Recommended Level of Care: [place] Prior Approval Number:    Date Approved/Denied:   PASRR Number: [id number]  Discharge Plan: SNF    Current Diagnoses: Patient Active Problem List   Diagnosis Date Noted   Elevated troponin 05/13/2022   Sepsis ([place]) 05/13/2022   sepsis secondary to hospital-acquired pneumonia 05/13/2022   Recurrent gastrointestinal hemorrhage 04/12/2022   Hip pain 12/27/2021   GI bleed 12/27/2021   Symptomatic anemia 12/26/2021   DNR (do not resuscitate) 12/26/2021   Paroxysmal atrial fibrillation ([place]) 09/10/2019   Secondary hypercoagulable state ([place]) 09/10/2019   Acute upper GI bleed 08/18/2019   Nonischemic cardiomyopathy ([place]) 07/24/2019   Acute hypoxemic respiratory failure ([place])    COPD  GOLD 0  01/17/2017   Chronic combined systolic (congestive) and diastolic (congestive) heart failure ([place]) 11/13/2016   PVC (premature ventricular contraction) 11/13/2016   Mixed hyperlipidemia 11/05/2016   Acute renal failure superimposed on stage 4 chronic kidney disease ([place]) 11/05/2016   Tobacco abuse 11/05/2016   Multifocal PVCs 11/05/2016   Moderate mitral regurgitation 11/05/2016   Acute on chronic combined systolic and diastolic CHF (congestive heart failure) ([place]) 11/05/2016   Microcytic anemia 11/05/2016   Diabetes mellitus with complication ([place])    Uncontrolled hypertension 06/18/2016   Multifocal atrial tachycardia ([place]) 11/26/2014   PVD  (peripheral vascular disease) ([place]) 12/15/2012    Orientation RESPIRATION BLADDER Height & Weight     Self, Time, Situation, Place  O2 (Nasal cannula 4-5L, weaning down prior to dc) Continent, External catheter Weight: 148 lb 9.4 oz (67.4 kg) Height:  '5\' 5"'$  (165.1 cm)  BEHAVIORAL SYMPTOMS/MOOD NEUROLOGICAL BOWEL NUTRITION STATUS      Continent Diet (See dc summary)  AMBULATORY STATUS COMMUNICATION OF NEEDS Skin   Limited Assist Verbally Normal                       Personal Care Assistance Level of Assistance  Bathing, Feeding, Dressing Bathing Assistance: Limited assistance Feeding assistance: Independent Dressing Assistance: Limited assistance     Functional Limitations Info             SPECIAL CARE FACTORS FREQUENCY  PT (By licensed PT), OT (By licensed OT)     PT Frequency: 5x/week OT Frequency: 5x/week            Contractures      Additional Factors Info  Code Status, Allergies, Insulin Sliding Scale Code Status Info: DNR Allergies Info: Penicillins, Retacrit (Epoetin (Alfa)), Crestor (Rosuvastatin), Hydrocodone, Influenza Vac Split Quad, Tape, Cefaclor, Crestor (Rosuvastatin Calcium), Influenza Vaccines, Lidocaine Hcl, Nsaids, Percocet (Oxycodone-acetaminophen), Prednisone   Insulin Sliding Scale Info: See dc summary       Current Medications (05/17/2022):  This is the current hospital active medication list Current Facility-Administered Medications  Medication Dose Route Frequency Provider Last Rate Last Admin   acetaminophen (TYLENOL) tablet 650 mg  650 mg Oral Q6H PRN Orma Flaming, MD   650 mg at 05/16/22 [phone]  Or   acetaminophen (TYLENOL) suppository 650 mg  650 mg Rectal Q6H PRN Orma Flaming, MD       amiodarone (PACERONE) tablet 100 mg  100 mg Oral Q M,W,F Orma Flaming, MD   100 mg at 05/16/22 0834   ampicillin (OMNIPEN) 2 g in sodium chloride 0.9 % 100 mL IVPB  2 g Intravenous Q8H Domenic Polite, MD 300 mL/hr at 05/17/22 0850 2 g at  05/17/22 0850   carvedilol (COREG) tablet 12.5 mg  12.5 mg Oral BID WC Domenic Polite, MD   12.5 mg at 05/17/22 0842   guaiFENesin (MUCINEX) 12 hr tablet 600 mg  600 mg Oral BID Orma Flaming, MD   600 mg at 05/17/22 0842   guaiFENesin-dextromethorphan (ROBITUSSIN DM) 100-10 MG/5ML syrup 5 mL  5 mL Oral Q4H PRN Orma Flaming, MD   5 mL at 05/17/22 0510   heparin injection 5,000 Units  5,000 Units Subcutaneous Q8H Orma Flaming, MD   5,000 Units at 05/17/22 1345   insulin aspart (novoLOG) injection 0-6 Units  0-6 Units Subcutaneous TID WC Orma Flaming, MD   2 Units at 05/17/22 1230   insulin aspart (novoLOG) injection 6 Units  6 Units Subcutaneous TID WC Domenic Polite, MD   6 Units at 05/17/22 1230   insulin detemir (LEVEMIR) injection 30 Units  30 Units Subcutaneous Daily Domenic Polite, MD   30 Units at 05/17/22 0843   ipratropium-albuterol (DUONEB) 0.5-2.5 (3) MG/3ML nebulizer solution 3 mL  3 mL Nebulization QID Domenic Polite, MD   3 mL at 05/17/22 1222   levalbuterol (XOPENEX) nebulizer solution 0.63 mg  0.63 mg Nebulization Q6H PRN Orma Flaming, MD       methylPREDNISolone sodium succinate (SOLU-MEDROL) 40 mg/mL injection 40 mg  40 mg Intravenous Q12H Domenic Polite, MD       ondansetron [facility]) tablet 4 mg  4 mg Oral Q6H PRN Orma Flaming, MD       Or   ondansetron [facility]) injection 4 mg  4 mg Intravenous Q6H PRN Orma Flaming, MD   4 mg at 05/16/22 1813   pantoprazole sodium (PROTONIX) 40 mg/20 mL oral suspension 40 mg  40 mg Oral Q1200 Orma Flaming, MD   40 mg at 05/17/22 1228   polyvinyl alcohol (LIQUIFILM TEARS) 1.4 % ophthalmic solution 1 drop  1 drop Both Eyes Q8H PRN Orma Flaming, MD       Derrill Memo ON 05/18/2022] predniSONE (DELTASONE) tablet 40 mg  40 mg Oral Q breakfast Domenic Polite, MD       sucralfate (CARAFATE) 1 GM/10ML suspension 1 g  1 g Oral QID Orma Flaming, MD   1 g at 05/17/22 1345     Discharge Medications: Please see discharge summary for a  list of discharge medications.  Relevant Imaging Results:  Relevant Lab Results:   Additional Information SS# [id number]  Benard Halsted, LCSW

## 2022-05-17 NOTE — Evaluation (Addendum)
Physical Therapy Evaluation Patient Details Name: Krista Mason MRN: [id number] DOB: April 01, 1936 Today's Date: 05/17/2022  History of Present Illness  86 yo female returns to [place] on 7/23 for onset of SOB with worsening, fever, cough and emesis.  She is dx with PNA sepsis, respiratory failure, CHF, elevated troponin, and is in acute renal failure.  Previously admitted 6/22 for transfusion after esophageal ulcer, off Eliquis; mild HTN.  PMHx:  HTN, CHF, PAF, CKD4, anemia, Covid, HLD,  Clinical Impression  Pt was seen for assessment of tolerance for movement with 4L O2, and had to increase to 5L to maintain consistent sats above 88%.  Her ability to move was slowed down by getting sats under control and finally did not have then energy to try to walk.  Pt needs assistance for all parts of mobility and is in an IL arrangement.  Per pt the staff cannot make time for any part of  her care.  Given that she requires help for all elements of care, will recommend SNF for now to manage safety and to ensure 24/7 help is set.  Her facility is not involved with validating her independence, per pt, so will focus on standing balance, strengthening LE's, and gait quality to get her home when ready with the interim plan for rehab.  Follow acute pt goals as are outlined below.     Recommendations for follow up therapy are one component of a multi-disciplinary discharge planning process, led by the attending physician.  Recommendations may be updated based on patient status, additional functional criteria and insurance authorization.  Follow Up Recommendations Skilled nursing-short term rehab (<3 hours/day) Can patient physically be transported by private vehicle: No    Assistance Recommended at Discharge Frequent or constant Supervision/Assistance  Patient can return home with the following  A lot of help with walking and/or transfers;A lot of help with bathing/dressing/bathroom;Assistance with  cooking/housework;Assist for transportation;Help with stairs or ramp for entrance    Equipment Recommendations None recommended by PT  Recommendations for Other Services       Functional Status Assessment Patient has had a recent decline in their functional status and demonstrates the ability to make significant improvements in function in a reasonable and predictable amount of time.     Precautions / Restrictions Precautions Precautions: Fall Precaution Comments: monitor HR and O2 sats Restrictions Weight Bearing Restrictions: No      Mobility  Bed Mobility Overal bed mobility: Needs Assistance Bed Mobility: Supine to Sit     Supine to sit: Min assist     General bed mobility comments: min assist to support trunk to get off bed    Transfers Overall transfer level: Needs assistance Equipment used: Rolling walker (2 wheels), 1 person hand held assist Transfers: Sit to/from Stand, Bed to chair/wheelchair/BSC Sit to Stand: Mod assist   Step pivot transfers: Min assist       General transfer comment: mod to power up then min to sidestep to chair    Ambulation/Gait               General Gait Details: pt declined to try to walk today  Stairs            Wheelchair Mobility    Modified Rankin (Stroke Patients Only)       Balance Overall balance assessment: Needs assistance Sitting-balance support: Bilateral upper extremity supported, Feet supported Sitting balance-Leahy Scale: Fair     Standing balance support: Bilateral upper extremity supported, During functional activity Standing  balance-Leahy Scale: Poor Standing balance comment: unsteady and requires minor assistance to remain balanced                             Pertinent Vitals/Pain Pain Assessment Pain Assessment: No/denies pain    Home Living Family/patient expects to be discharged to:: Private residence Living Arrangements: Alone Available Help at Discharge:  Friend(s);Available PRN/intermittently Type of Home: Apartment Home Access: Level entry       Home Layout: One level Home Equipment: Conservation officer, nature (2 wheels);Rollator (4 wheels);Wheelchair - manual;Shower seat;Grab bars - toilet;Grab bars - tub/shower;Hand held shower head      Prior Function Prior Level of Function : Independent/Modified Independent             Mobility Comments: RW in room, rollator in hall and ADLs Comments: facility does meals and laundry     Hand Dominance   Dominant Hand: Left    Extremity/Trunk Assessment   Upper Extremity Assessment Upper Extremity Assessment: Defer to OT evaluation    Lower Extremity Assessment Lower Extremity Assessment: Generalized weakness    Cervical / Trunk Assessment Cervical / Trunk Assessment: Kyphotic  Communication   Communication: No difficulties  Cognition Arousal/Alertness: Awake/alert Behavior During Therapy: WFL for tasks assessed/performed Overall Cognitive Status: Within Functional Limits for tasks assessed                                 General Comments: pt is able to answer questions about her care        General Comments General comments (skin integrity, edema, etc.): Pt is up to walk to chair only, unable to take steps away from transition to chair over fatigue.  Desatting before PT started to move her on 4L at times, and intransition dropped to 85%. Had to have 5L to return to 93% at rest, but is variable    Exercises     Assessment/Plan    PT Assessment Patient needs continued PT services  PT Problem List Decreased strength;Decreased range of motion;Decreased activity tolerance;Decreased balance;Decreased mobility;Decreased coordination;Cardiopulmonary status limiting activity       PT Treatment Interventions DME instruction;Gait training;Functional mobility training;Therapeutic activities;Therapeutic exercise;Balance training;Neuromuscular re-education;Patient/family  education    PT Goals (Current goals can be found in the Care Plan section)  Acute Rehab PT Goals Patient Stated Goal: to get home to IL at [facility] PT Goal Formulation: With patient Time For Goal Achievement: 05/31/22 Potential to Achieve Goals: Good    Frequency Min 3X/week     Co-evaluation               AM-PAC PT "6 Clicks" Mobility  Outcome Measure Help needed turning from your back to your side while in a flat bed without using bedrails?: A Little Help needed moving from lying on your back to sitting on the side of a flat bed without using bedrails?: A Little Help needed moving to and from a bed to a chair (including a wheelchair)?: A Little Help needed standing up from a chair using your arms (e.g., wheelchair or bedside chair)?: A Lot Help needed to walk in hospital room?: A Little Help needed climbing 3-5 steps with a railing? : Total 6 Click Score: 15    End of Session Equipment Utilized During Treatment: Oxygen Activity Tolerance: Patient limited by fatigue;Treatment limited secondary to medical complications (Comment) Patient left: in chair;with call bell/phone within reach;with  chair alarm set Nurse Communication: Mobility status PT Visit Diagnosis: Unsteadiness on feet (R26.81);Muscle weakness (generalized) (M62.81);Difficulty in walking, not elsewhere classified (R26.2)    Time: 1125-1204 PT Time Calculation (min) (ACUTE ONLY): 39 min   Charges:   PT Evaluation $PT Eval Moderate Complexity: 1 Mod PT Treatments $Therapeutic Exercise: 8-22 mins $Therapeutic Activity: 8-22 mins       Ramond Dial 05/17/2022, 1:39 PM  Mee Hives, PT PhD Acute Rehab Dept. Number: [place] [facility] [place]

## 2022-05-18 ENCOUNTER — Inpatient Hospital Stay (HOSPITAL_COMMUNITY): Payer: Medicare HMO

## 2022-05-18 DIAGNOSIS — I5043 Acute on chronic combined systolic (congestive) and diastolic (congestive) heart failure: Secondary | ICD-10-CM | POA: Diagnosis not present

## 2022-05-18 DIAGNOSIS — E1169 Type 2 diabetes mellitus with other specified complication: Secondary | ICD-10-CM | POA: Diagnosis not present

## 2022-05-18 DIAGNOSIS — Z515 Encounter for palliative care: Secondary | ICD-10-CM | POA: Diagnosis not present

## 2022-05-18 DIAGNOSIS — D509 Iron deficiency anemia, unspecified: Secondary | ICD-10-CM

## 2022-05-18 DIAGNOSIS — Z7189 Other specified counseling: Secondary | ICD-10-CM

## 2022-05-18 DIAGNOSIS — Z66 Do not resuscitate: Secondary | ICD-10-CM

## 2022-05-18 DIAGNOSIS — E785 Hyperlipidemia, unspecified: Secondary | ICD-10-CM

## 2022-05-18 DIAGNOSIS — I480 Paroxysmal atrial fibrillation: Secondary | ICD-10-CM | POA: Diagnosis not present

## 2022-05-18 DIAGNOSIS — J189 Pneumonia, unspecified organism: Secondary | ICD-10-CM | POA: Diagnosis not present

## 2022-05-18 LAB — CBC WITH DIFFERENTIAL/PLATELET
Abs Immature Granulocytes: 0.59 10*3/uL — ABNORMAL HIGH (ref 0.00–0.07)
Basophils Absolute: 0.1 10*3/uL (ref 0.0–0.1)
Basophils Relative: 0 %
Eosinophils Absolute: 0 10*3/uL (ref 0.0–0.5)
Eosinophils Relative: 0 %
HCT: 33.6 % — ABNORMAL LOW (ref 36.0–46.0)
Hemoglobin: 10.8 g/dL — ABNORMAL LOW (ref 12.0–15.0)
Immature Granulocytes: 4 %
Lymphocytes Relative: 2 %
Lymphs Abs: 0.4 10*3/uL — ABNORMAL LOW (ref 0.7–4.0)
MCH: 29.3 pg (ref 26.0–34.0)
MCHC: 32.1 g/dL (ref 30.0–36.0)
MCV: 91.1 fL (ref 80.0–100.0)
Monocytes Absolute: 0.5 10*3/uL (ref 0.1–1.0)
Monocytes Relative: 3 %
Neutro Abs: 13.7 10*3/uL — ABNORMAL HIGH (ref 1.7–7.7)
Neutrophils Relative %: 91 %
Platelets: 335 10*3/uL (ref 150–400)
RBC: 3.69 MIL/uL — ABNORMAL LOW (ref 3.87–5.11)
RDW: 16.4 % — ABNORMAL HIGH (ref 11.5–15.5)
WBC: 15.2 10*3/uL — ABNORMAL HIGH (ref 4.0–10.5)
nRBC: 0 % (ref 0.0–0.2)

## 2022-05-18 LAB — BASIC METABOLIC PANEL
Anion gap: 10 (ref 5–15)
BUN: 83 mg/dL — ABNORMAL HIGH (ref 8–23)
CO2: 30 mmol/L (ref 22–32)
Calcium: 9.8 mg/dL (ref 8.9–10.3)
Chloride: 97 mmol/L — ABNORMAL LOW (ref 98–111)
Creatinine, Ser: 2.25 mg/dL — ABNORMAL HIGH (ref 0.44–1.00)
GFR, Estimated: 21 mL/min — ABNORMAL LOW (ref >60)
Glucose, Bld: 318 mg/dL — ABNORMAL HIGH (ref 70–99)
Potassium: 3.8 mmol/L (ref 3.5–5.1)
Sodium: 137 mmol/L (ref 135–145)

## 2022-05-18 LAB — CBC
HCT: 33.3 % — ABNORMAL LOW (ref 36.0–46.0)
Hemoglobin: 10.3 g/dL — ABNORMAL LOW (ref 12.0–15.0)
MCH: 28.7 pg (ref 26.0–34.0)
MCHC: 30.9 g/dL (ref 30.0–36.0)
MCV: 92.8 fL (ref 80.0–100.0)
Platelets: 328 10*3/uL (ref 150–400)
RBC: 3.59 MIL/uL — ABNORMAL LOW (ref 3.87–5.11)
RDW: 16.4 % — ABNORMAL HIGH (ref 11.5–15.5)
WBC: 13.9 10*3/uL — ABNORMAL HIGH (ref 4.0–10.5)
nRBC: 0.1 % (ref 0.0–0.2)

## 2022-05-18 LAB — GLUCOSE, CAPILLARY
Glucose-Capillary: 347 mg/dL — ABNORMAL HIGH (ref 70–99)
Glucose-Capillary: 302 mg/dL — ABNORMAL HIGH (ref 70–99)
Glucose-Capillary: 226 mg/dL — ABNORMAL HIGH (ref 70–99)
Glucose-Capillary: 199 mg/dL — ABNORMAL HIGH (ref 70–99)

## 2022-05-18 MED ORDER — BISACODYL 5 MG PO TBEC: 5.0000 mg | DELAYED_RELEASE_TABLET | Freq: Every day | ORAL | Status: DC | PRN

## 2022-05-18 MED ORDER — POLYETHYLENE GLYCOL 3350 17 G PO PACK
17.0000 g | PACK | Freq: Every day | ORAL | Status: DC
  Administered 2022-05-19 – 2022-05-21 (×2): 17 g via ORAL
  Filled 2022-05-18 (×3): qty 1

## 2022-05-18 MED ORDER — GUAIFENESIN 100 MG/5ML PO LIQD
5.0000 mL | Freq: Two times a day (BID) | ORAL | Status: DC
  Administered 2022-05-18 – 2022-05-21 (×5): 5 mL via ORAL
  Filled 2022-05-18 (×8): qty 5

## 2022-05-18 MED ORDER — FUROSEMIDE 10 MG/ML IJ SOLN
60.0000 mg | Freq: Two times a day (BID) | INTRAMUSCULAR | Status: DC
  Administered 2022-05-18 – 2022-05-21 (×6): 60 mg via INTRAVENOUS
  Filled 2022-05-18 (×6): qty 6

## 2022-05-18 NOTE — Consult Note (Cosign Needed Addendum)
Palliative Care Consult Note                                  Date: 05/18/2022   Patient Name: Krista Mason  DOB: [birth year]  MRN: [id number]  Age / Sex: 86 y.o., female  PCP: Lawerance Cruel, MD Referring Physician: Tawni Millers  Reason for Consultation: Establishing goals of care  HPI/Patient Profile: 86 y.o. female  with past medical history of atrial fibrillation (not on [facility] due to history of GI bleed), chronic diastolic heart failure, pacemaker placement, CKD stage IV, type 2 diabetes, chronic anemia, hypertension, and hyperlipidemia who [facility] ED on 05/13/2022 with shortness of breath.  Chest x-ray showed dense alveolar infiltrate at the right upper lobe.  Initially with high oxygen requirements requiring BiPAP and 20 L/min per HFNC.  Admitted to [facility] with pneumonia and acute respiratory failure. Palliative medicine has been asked to assist with goals of care discussions.   Past Medical History:  Diagnosis Date   Anemia    Atrial fibrillation (HCC)    Chronic combined systolic and diastolic CHF (congestive heart failure) ([place])    a. LV dysfcuntion felt to be related to PVCs   Chronic kidney disease, stage 4 (severe) (HCC)    Diabetes mellitus without complication (HCC)    GERD (gastroesophageal reflux disease)    Hx of echocardiogram    Echo (1/16):  EF 60-65%, no RWMA, Gr 1 DD, mod AI, mod MR, mild LAE   Hyperkalemia    Hyperlipidemia    Hypertension    NICM (nonischemic cardiomyopathy) ([place])    a. 12/2018 Echo: EF 35%, diff HK w/ septal-lateral dyssynchrony. Nl RV fxn; b. 07/2019 s/p MDT [id number] [name] CRT-P MRI SureScan device (ser #: [id number])   Orthostatic hypotension    PVC (premature ventricular contraction)    a. s/p PVC ablation in 2016/12/05   Rheumatic fever    Wears dentures     Subjective:   I have reviewed medical records including EPIC notes, labs and imaging.   I met with  patient at bedside to discuss diagnosis, prognosis, GOC, EOL wishes, disposition, and options.  She reports significant dyspnea even at rest.  She reports feeling "awful" and having very poor appetite.  She has been eating and drinking very little.  I introduced Palliative Medicine as specialized medical care for people living with serious illness. It focuses on providing relief from the symptoms and stress of a serious illness.   A brief life review was discussed.  Davon was born and raised in [place].  After high school, she received a scholarship and went to nursing school in [place].  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  She is widowed -her husband passed away in 12/05/97.  She has 3 children - daughter/Sonya and son/Tim live close by, and son/Richard lives in [place].  She also has 7 grandchildren and 1 great grandchild.  Tyronda lives alone in her home in [place].  Prior to the admission, she was ambulatory with a walker.  She was independent with her ADLs.  However, she tells me that the last 5 years have been difficult as her overall health and functional status has declined has declined.  We discussed patient's current illness and what it means in the larger context of her ongoing co-morbidities. Current clinical status was reviewed.  We Discussed that she has multiple medical problems including chronic heart failure, anemia, and a-fib.  Reviewed the natural trajectory of chronic illness, emphasizing that it is non-curable and progressive. Discussed that chronic illness results in decreased functional status over time, as patients do not usually return to previous baseline after having a major illness.   Created space and opportunity for patient to explore thoughts and feelings regarding current medical situation. Values and goals of care important to patient were attempted to be elicited.  Rossetta tells me that she is overall tired.  She  feels that she is declining and tells me that she is "ready to die".  She seems to be at peace with this.  She feels that her quality of life is not good.  She is tired of being "in and out of the hospital".  The difference between full scope medical intervention and comfort care was considered.  I reviewed the concept of a comfort path, emphasizing that this path involves de-escalating and stopping full scope medical interventions, allowing a natural course to occur. Discussed that the goal is comfort and dignity rather than cure/prolonging life.   Eliabeth is clear that she is ready to transition to comfort care.  She tells me that she has already shared this with her children, but wants to have a meeting to discuss with them further prior to making any changes.  I told her I would call her daughter Davy Pique and schedule a meeting for tomorrow.  I later spoke with her daughter Davy Pique by phone and scheduled a family meeting for tomorrow.   Review of Systems  Constitutional:  Positive for appetite change and fatigue.  Respiratory:  Positive for shortness of breath.     Objective:   Primary Diagnoses: Present on Admission:  Acute renal failure superimposed on stage 4 chronic kidney disease (HCC)  Paroxysmal atrial fibrillation (HCC)  Diabetes mellitus with complication (HCC)  Acute hypoxemic respiratory failure (HCC)  Uncontrolled hypertension  Mixed hyperlipidemia  (Resolved) Acute respiratory failure with hypoxia (HCC)  Microcytic anemia   Physical Exam Vitals reviewed.  Constitutional:      General: She is in acute distress.     Appearance: She is ill-appearing.  Pulmonary:     Effort: Pulmonary effort is normal. Tachypnea present.  Neurological:     Mental Status: She is alert and oriented to person, place, and time.     Motor: Weakness present.     Vital Signs:  BP (!) 158/83 (BP Location: Right Arm)   Pulse (!) 107   Temp 97.6 F (36.4 C) (Axillary)   Resp 18   Ht _0   (1.651 m)   Wt 67.4 kg   SpO2 95%   BMI 24.73 kg/m   Palliative Assessment/Data: PPS 30%     Assessment & Plan:   SUMMARY OF RECOMMENDATIONS   DNR/DNI as previously documented Continue current interventions for now Patient is adamant that she is ready to transition to comfort care Family meeting scheduled for tomorrow 7/28 at 11:30 AM  Primary Decision Maker: PATIENT  Prognosis:  Overall poor  Discharge Planning:  To Be Determined     Thank you for allowing [place] to participate in the care of [place] [facility] [place]   Signed by: Elie Confer, NP Palliative Medicine Team  Team Phone # [phone]  For individual providers, please see AMION

## 2022-05-18 NOTE — TOC Initial Note (Addendum)
Transition of Care [facility]) - Initial/Assessment Note    Patient Details  Name: Krista Mason MRN: [id number] Date of Birth: 05-Oct-1936  Transition of Care [facility]) CM/SW Contact:    Barton Fanny, [place] Work Phone Number: 05/18/2022, 12:00 PM  Clinical Narrative:                 MSW intern spoke with patient at bedside regarding the possible discharge plan to a skilled nursing facility. Patient advised she was in agreement, but did not wish to go back to [place] as she had a very bad experience there a few years back. MSW intern left a list of possible SNFs for patient to review. Patient asked MSW intern to contact her daughter as well and update her.  12:13 pm: MSW intern contacted daughter, Davy Pique and updated her regarding the possible discharge to a SNF. Daughter provided email and MSW intern will email list to her of accepted SNFs. Daughter asked MSW intern about palliative care consult. MSW intern advised there was a consult in and ordered.   Expected Discharge Plan: Skilled Nursing Facility Barriers to Discharge: Continued Medical Work up   Patient Goals and CMS Choice        Expected Discharge Plan and Services Expected Discharge Plan: [place]       Living arrangements for the past 2 months: Single Family Home                                      Prior Living Arrangements/Services Living arrangements for the past 2 months: Single Family Home Lives with:: Self Patient language and need for interpreter reviewed:: Yes Do you feel safe going back to the place where you live?: Yes      Need for Family Participation in Patient Care: Yes (Comment) Care giver support system in place?: Yes (comment)   Criminal Activity/Legal Involvement Pertinent to Current Situation/Hospitalization: No - Comment as needed  Activities of Daily Living   ADL Screening (condition at time of admission) Patient's cognitive ability adequate to safely complete daily  activities?: Yes Is the patient deaf or have difficulty hearing?: No Does the patient have difficulty seeing, even when wearing glasses/contacts?: No Does the patient have difficulty concentrating, remembering, or making decisions?: No Patient able to express need for assistance with ADLs?: Yes Does the patient have difficulty dressing or bathing?: No Independently performs ADLs?: Yes (appropriate for developmental age) Does the patient have difficulty walking or climbing stairs?: No Weakness of Legs: None Weakness of Arms/Hands: None  Permission Sought/Granted                  Emotional Assessment Appearance:: Appears stated age Attitude/Demeanor/Rapport: Reactive, Gracious Affect (typically observed): Accepting Orientation: : Oriented to Self, Oriented to Place, Oriented to  Time, Oriented to Situation Alcohol / Substance Use: Not Applicable    Admission diagnosis:  Hospital-acquired pneumonia [J18.9, Y95] Acute respiratory failure with hypoxia (HCC) [J96.01] Acute on chronic combined systolic and diastolic CHF (congestive heart failure) (HCC) [I50.43] Patient Active Problem List   Diagnosis Date Noted   Elevated troponin 05/13/2022   Sepsis ([place]) 05/13/2022   sepsis secondary to hospital-acquired pneumonia 05/13/2022   Recurrent gastrointestinal hemorrhage 04/12/2022   Hip pain 12/27/2021   GI bleed 12/27/2021   Symptomatic anemia 12/26/2021   DNR (do not resuscitate) 12/26/2021   Paroxysmal atrial fibrillation ([place]) 09/10/2019   Secondary hypercoagulable state ([place])  09/10/2019   Acute upper GI bleed 08/18/2019   Nonischemic cardiomyopathy ([place]) 07/24/2019   Acute hypoxemic respiratory failure ([place])    COPD  GOLD 0  01/17/2017   Chronic combined systolic (congestive) and diastolic (congestive) heart failure ([place]) 11/13/2016   PVC (premature ventricular contraction) 11/13/2016   Mixed hyperlipidemia 11/05/2016   Acute renal failure superimposed on stage 4 chronic  kidney disease ([place]) 11/05/2016   Tobacco abuse 11/05/2016   Multifocal PVCs 11/05/2016   Moderate mitral regurgitation 11/05/2016   Acute on chronic combined systolic and diastolic CHF (congestive heart failure) ([place]) 11/05/2016   Microcytic anemia 11/05/2016   Diabetes mellitus with complication ([place])    Uncontrolled hypertension 06/18/2016   Multifocal atrial tachycardia ([place]) 11/26/2014   PVD (peripheral vascular disease) ([place]) 12/15/2012   PCP:  Lawerance Cruel, MD Pharmacy:   [facility] - [place], [place] - [address] [place] [address] Phone: [phone] Fax: [phone]     Social Determinants of Health (SDOH) Interventions    Readmission Risk Interventions     No data to display

## 2022-05-18 NOTE — Progress Notes (Addendum)
Progress Note   Patient: Krista Mason JXB:[id number] DOB: 1936/03/11 DOA: 05/13/2022     5 DOS: the patient was seen and examined on 05/18/2022   Brief hospital course: Mrs. Ashworth was admitted to the hospital with the working diagnosis of pneumonia, complicated with acute hypoxemic respiratory failure.   86 yo female with the past medical history of chronic anemia, atrial fibrillation, diastolic heart failure, hypertension, chronic kidney disease stage IV, and dyslipidemia who presented with dyspnea. Patient with worsening symptoms for 7 days, including dyspnea and cough, associated elevated temperature up to 100.2. EMS was called and she was found hypoxic with 02 saturation 74%, she was placed on C pap and transported to the ED.  Recent hospitalization for GI bleed and symptomatic anemia 06/23.   On her initial physical examination her blood pressure was 108/89, HR 98 and RR 23 with 02 saturation 97% on supplemental 02. Lungs with coarse breath sounds bilaterally, decreased air movement right upper lobe, heart with S1 and S2 present irregularly irregular with no gallops, abdomen not distended, and no lower extremity edema.   Na 132, K 4,2 CL 96, bicarbonate 25, glucose 293 bun 42 cr 2,65  BNP 1,351 Lactic acid 2.1 Wbc  15.3 hgb 10.2 plt 222    Blood cultures, positive for haemophilus influenzae and staphylococcus epidermidis.  Sars covid 19 negative  Urine analysis with SG 1,012 negative leukocytes   Chest radiograph with dense alveolar infiltrate at the right upper lobe. No effusions, pacemaker in place with one atrial and one ventricular lead.   EKG 102 bpm, left axis deviation and left bundle branch block, intermittent a sensed and v paced, with intermittent atrial fibrillation, positive PAC and PVC, no significant ST segment or T wave changes.   Patient was placed on broad spectrum antibiotic therapy. Initially with high oxygen requirements requiring Bipap and 20 L/min per HFNC.    Slowly improving oxygenation. Antibiotic therapy narrowed to ampicillin.      Assessment and Plan: * sepsis secondary to hospital-acquired pneumonia Right upper lobe pneumonia,due to Hemophilus  Complicated with severe sepsis present on admission.  Acute hypoxemic respiratory failure.   Patient continue very weak and deconditioned, persistent dyspnea.  02 saturation is 97 on 5 L per HFNC  RR in th 20's  Wbc continue elevated at 13,9   Plan to continue antibiotic therapy with ampicillin.  Bronchodilator therapy and air way clearing techniques Out of bed to chair as tolerated.  Continue oxymetry monitoring and supplemental 02 per [place] to keep 02 saturation 92% or greater.  Systemic steroids have been discontinued.  Follow up chest radiograph today.   Acute on chronic combined systolic and diastolic CHF (congestive heart failure) (HCC) Echocardiogram with mild reduction in systolic function 40 to 13%, global hypkinesis, internal cavity with mild dilatation, LVH, RV systolic function preserved, RVSP 53,9, small pericardial effusion. Moderate to severe mitral valve regurgitation, moderate tricuspid regurgitation.  Acute on chronic core pulmonale.   Urine output is 1,300 ml.  Systolic blood pressure is 140 to 150 mmHg.   Plan to continue carvedilol.  Resume diuresis with furosemide 60 mg IV q12 hrs.        Acute renal failure superimposed on stage 4 chronic kidney disease ([place]) Renal function with serum cr at 2,25 with K at 3,8 and serum bicarbonate at 30. Na is 137,  Plan to continue diuresis with furosemide and follow up renal function and electrolytes in am.    Paroxysmal atrial fibrillation [facility]) Patient with intermittent  paced rhythm, underlying atrial fibrillation with rate controlled.  Continue amiodarone every other day M-W-F.  No anticoagulation due to recent GI bleed.  Continue telemetry monitoring.   Type 2 diabetes mellitus with hyperlipidemia (HCC) Steroid  induced hyperglycemia with fasting insulin today 318 mg/dl Continue insulin sliding scale for glucose cover and monitoring.  Basal insulin 30 units and pre meal 6 units of short acting.   Systemic steroids have been discontinued.   Continue with statin therapy.    Microcytic anemia Anemia of chronic disease.  Continue close follow up on cell count.         Subjective: Patient continue to have dyspnea, very weak and deconditioned, very poor appetite, today with nausea and vomiting.   Physical Exam: Vitals:   05/18/22 0333 05/18/22 0806 05/18/22 0810 05/18/22 1200  BP: (!) 142/95 (!) 142/53 (!) 142/53 (!) 158/83  Pulse: (!) 103 100 (!) 112 (!) 107  Resp: '19 19 18 18  '$ Temp: 97.7 F (36.5 C) 97.8 F (36.6 C)  97.6 F (36.4 C)  TempSrc: Oral Oral  Axillary  SpO2: 94% 97% 100% 95%  Weight:      Height:       Neurology awake and alert ENT with mild pallor Cardiovascular with S1 and S2 present with positive systolic murmur at the aped Positive JVD No lower extremity edema Respiratory with bilateral rales and rhonchi, no wheezing Abdomen distended and tympanic  Data Reviewed:    Family Communication: no family at the bedside   Disposition: Status is: Inpatient Remains inpatient appropriate because: respiratory failure   Planned Discharge Destination: Home    Author: Tawni Millers, MD 05/18/2022 2:44 PM  For on call review [email or web address].

## 2022-05-18 NOTE — Plan of Care (Signed)
?  Problem: Activity: ?Goal: Capacity to carry out activities will improve ?Outcome: Progressing ?  ?

## 2022-05-18 NOTE — Hospital Course (Signed)
Krista Mason was admitted to the hospital with the working diagnosis of pneumonia, complicated with acute hypoxemic respiratory failure.   86 yo female with the past medical history of chronic anemia, atrial fibrillation, diastolic heart failure, hypertension, chronic kidney disease stage IV, and dyslipidemia who presented with dyspnea. Patient with worsening symptoms for 7 days, including dyspnea and cough, associated elevated temperature up to 100.2. EMS was called and she was found hypoxic with 02 saturation 74%, she was placed on C pap and transported to the ED.  Recent hospitalization for GI bleed and symptomatic anemia 06/23.   On her initial physical examination her blood pressure was 108/89, HR 98 and RR 23 with 02 saturation 97% on supplemental 02. Lungs with coarse breath sounds bilaterally, decreased air movement right upper lobe, heart with S1 and S2 present irregularly irregular with no gallops, abdomen not distended, and no lower extremity edema.   Na 132, K 4,2 CL 96, bicarbonate 25, glucose 293 bun 42 cr 2,65  BNP 1,351 Lactic acid 2.1 Wbc  15.3 hgb 10.2 plt 222    Blood cultures, positive for haemophilus influenzae and staphylococcus epidermidis.  Sars covid 19 negative  Urine analysis with SG 1,012 negative leukocytes   Chest radiograph with dense alveolar infiltrate at the right upper lobe. No effusions, pacemaker in place with one atrial and one ventricular lead.   EKG 102 bpm, left axis deviation and left bundle branch block, intermittent a sensed and v paced, with intermittent atrial fibrillation, positive PAC and PVC, no significant ST segment or T wave changes.   Patient was placed on broad spectrum antibiotic therapy. Initially with high oxygen requirements requiring Bipap and 20 L/min per HFNC.   Slowly improving oxygenation. Antibiotic therapy narrowed to ampicillin.   Patient very weak and deconditioned, rapid worsening physical functional status. She has decided to  transition to hospice care.

## 2022-05-18 NOTE — Progress Notes (Signed)
PT Cancellation Note  Patient Details Name: Krista Mason MRN: [id number] DOB: 25-Mar-1936   Cancelled Treatment:    Reason Eval/Treat Not Completed: Other (comment).  Made two attempts to see and pt is not feeling well, very discouraged about how things are going.  Follow up with her for another time as pt can allow.   Ramond Dial 05/18/2022, 1:53 PM  Mee Hives, PT PhD Acute Rehab Dept. Number: [place]

## 2022-05-19 DIAGNOSIS — R638 Other symptoms and signs concerning food and fluid intake: Secondary | ICD-10-CM

## 2022-05-19 DIAGNOSIS — N184 Chronic kidney disease, stage 4 (severe): Secondary | ICD-10-CM

## 2022-05-19 DIAGNOSIS — I5043 Acute on chronic combined systolic (congestive) and diastolic (congestive) heart failure: Secondary | ICD-10-CM | POA: Diagnosis not present

## 2022-05-19 DIAGNOSIS — N179 Acute kidney failure, unspecified: Secondary | ICD-10-CM

## 2022-05-19 DIAGNOSIS — D509 Iron deficiency anemia, unspecified: Secondary | ICD-10-CM | POA: Diagnosis not present

## 2022-05-19 DIAGNOSIS — J189 Pneumonia, unspecified organism: Secondary | ICD-10-CM | POA: Diagnosis not present

## 2022-05-19 DIAGNOSIS — I480 Paroxysmal atrial fibrillation: Secondary | ICD-10-CM | POA: Diagnosis not present

## 2022-05-19 LAB — BASIC METABOLIC PANEL
Anion gap: 10 (ref 5–15)
BUN: 83 mg/dL — ABNORMAL HIGH (ref 8–23)
CO2: 30 mmol/L (ref 22–32)
Calcium: 9.6 mg/dL (ref 8.9–10.3)
Chloride: 98 mmol/L (ref 98–111)
Creatinine, Ser: 1.97 mg/dL — ABNORMAL HIGH (ref 0.44–1.00)
GFR, Estimated: 25 mL/min — ABNORMAL LOW (ref >60)
Glucose, Bld: 239 mg/dL — ABNORMAL HIGH (ref 70–99)
Potassium: 3.7 mmol/L (ref 3.5–5.1)
Sodium: 138 mmol/L (ref 135–145)

## 2022-05-19 LAB — MAGNESIUM: Magnesium: 2.7 mg/dL — ABNORMAL HIGH (ref 1.7–2.4)

## 2022-05-19 MED ORDER — GLYCOPYRROLATE 1 MG PO TABS: 1.0000 mg | ORAL_TABLET | ORAL | Status: DC | PRN

## 2022-05-19 MED ORDER — BIOTENE DRY MOUTH MT LIQD: 15.0000 mL | OROMUCOSAL | Status: DC | PRN

## 2022-05-19 MED ORDER — HYDROMORPHONE HCL 1 MG/ML IJ SOLN
0.5000 mg | INTRAMUSCULAR | Status: DC
  Administered 2022-05-19 – 2022-05-20 (×3): 1 mg via INTRAVENOUS
  Filled 2022-05-19 (×3): qty 1

## 2022-05-19 MED ORDER — HYDROMORPHONE HCL 1 MG/ML IJ SOLN
0.5000 mg | INTRAMUSCULAR | Status: DC | PRN
  Administered 2022-05-21: 1 mg via INTRAVENOUS
  Filled 2022-05-19: qty 1

## 2022-05-19 MED ORDER — LORAZEPAM 2 MG/ML IJ SOLN: 1.0000 mg | INTRAMUSCULAR | Status: DC | PRN

## 2022-05-19 MED ORDER — LORAZEPAM 1 MG PO TABS: 1.0000 mg | ORAL_TABLET | ORAL | Status: DC | PRN

## 2022-05-19 MED ORDER — GLYCOPYRROLATE 0.2 MG/ML IJ SOLN: 0.2000 mg | INTRAMUSCULAR | Status: DC | PRN

## 2022-05-19 MED ORDER — BISACODYL 10 MG RE SUPP
10.0000 mg | Freq: Every day | RECTAL | Status: DC | PRN
  Administered 2022-05-19: 10 mg via RECTAL
  Filled 2022-05-19: qty 1

## 2022-05-19 MED ORDER — LORAZEPAM 2 MG/ML PO CONC: 1.0000 mg | ORAL | Status: DC | PRN

## 2022-05-19 NOTE — Progress Notes (Signed)
Palliative Medicine Progress Note   Patient Name: Krista Mason       Date: 05/19/2022 DOB: 1936-05-16  Age: 86 y.o. MRN#: [id number] Attending Physician: Tawni Millers,* Primary Care Physician: Lawerance Cruel, MD Admit Date: 05/13/2022    HPI/Patient Profile: 86 y.o. female  with past medical history of atrial fibrillation (not on [facility] due to history of GI bleed), chronic diastolic heart failure, pacemaker placement, CKD stage IV, type 2 diabetes, chronic anemia, hypertension, and hyperlipidemia who presented to the [facility] ED on 05/13/2022 with shortness of breath.  Chest x-ray showed dense alveolar infiltrate at the right upper lobe.  Initially with high oxygen requirements requiring BiPAP and 20 L/min per HFNC.  Admitted to [facility] with pneumonia and acute respiratory failure. Palliative medicine has been asked to assist with goals of care discussions.    Subjective (Family Meeting): I met today at bedside with Mahum and her family.  Present in the room are daughter Davy Pique, son Octavia Bruckner, and their spouses.  Her son Delfino Lovett and his spouse are present by speaker phone.  We reviewed Esma's course over the past several months.  We discussed that she has multiple medical problems including chronic heart failure, anemia, and a-fib.  Reviewed the natural trajectory of chronic illness, emphasizing that it is non-curable and progressive. Discussed that chronic illness results in decreased functional status over time, as patients do not usually return to previous baseline after having a major illness.   Provided space and opportunity for patient and family to express feelings regarding her current medical situation.  [name] is quite adamant that she is tired and has significant discomfort due to her  illnesses.  She has dyspnea even at rest and is eating/drinking very little.  She reports having struggled over the past several months and is tired of being in and out of the hospital.  She is adamant she does not want to go to SNF for rehab.  The difference between full scope medical intervention and comfort care was considered.  I reviewed the concept of a comfort path, emphasizing that this path involves de-escalating and stopping full scope medical interventions, allowing a natural course to occur. Discussed that the goal is comfort and dignity rather than cure/prolonging life. Discussed transitioning to comfort care while in the hospital, and what that would look like--keeping  her clean and dry, no labs, no artificial hydration or feeding, no antibiotics, minimizing of medications, and medication for pain and dyspnea.  I provided education on the indications for opioids to manage pain and dyspnea at end-of-life.  [name] is clear that she is ready to transition to comfort care.  She states that she does not want to prolong the situation.  Her family is understandably emotional, but ultimately supports her decision.  Emotional support provided throughout discussion.  Discussed option to transfer to residential hospice facility for a more peaceful setting at end-of-life.  Patient and family are open to this, and want to discuss further tomorrow.   Objective:  Physical Exam Vitals reviewed.  Constitutional:      General: She is not in acute distress.    Appearance: She is ill-appearing.  Pulmonary:     Effort: Pulmonary effort is normal. Tachypnea present.  Neurological:     Mental Status: She is alert.     Motor: Weakness present.             Vital Signs: BP (!) 156/82 (BP Location: Right Arm)   Pulse (!) 102   Temp 98.7 F (37.1 C) (Oral)   Resp 20   Ht _0  (1.651 m)   Wt 68.4 kg   SpO2 99%   BMI 25.09 kg/m  SpO2: SpO2: 99 % O2 Device: O2 Device: High Flow Nasal Cannula O2  Flow Rate: O2 Flow Rate (L/min): 5 L/min    LBM: Last BM Date : 05/13/22     Palliative Assessment/Data: PPS 20%     Palliative Medicine Assessment & Plan   Assessment: Principal Problem:   sepsis secondary to hospital-acquired pneumonia Active Problems:   Acute renal failure superimposed on stage 4 chronic kidney disease (HCC)   Acute on chronic combined systolic and diastolic CHF (congestive heart failure) (HCC)   Microcytic anemia   Type 2 diabetes mellitus with hyperlipidemia (HCC)   Paroxysmal atrial fibrillation (HCC)    Recommendations/Plan: Full comfort measures initiated DNR/DNI as previously documented Added orders for symptom management at EOL Discontinue labs, antibiotics, and cardiac monitoring Minimize medications Transfer to 6N PMT will continue to follow  Symptom Management:  Scheduled Dilaudid 0.5 to 1 mg IV every 4 hours PRN Dilaudid 0.5 to 1 mg IV every 4 hours as needed for breakthrough pain or dyspnea Lorazepam (ATIVAN) prn for anxiety Glycopyrrolate (ROBINUL) for excessive secretions Ondansetron (ZOFRAN) prn for nausea Polyvinyl alcohol (LIQUIFILM TEARS) prn for dry eyes Antiseptic oral rinse (BIOTENE) prn for dry mouth   Prognosis:  < 2 weeks  Discharge Planning: To Be Determined  Care plan was discussed with Dr. Cathlean Sauer and patient's RN   Thank you for allowing the Palliative Medicine Team to assist in the care of this patient.   Greater than 50%  of this time was spent counseling and coordinating care related to the above assessment and plan.  Total time: 45 minutes   Lavena Bullion, NP Palliative Medicine   Please contact Palliative Medicine Team phone at [phone] for questions and concerns.  For individual provider, see AMION.

## 2022-05-19 NOTE — Progress Notes (Signed)
Nutrition Brief Note  RD received a consult for assessment of nutrition requirements/status. Chart reviewed. Pt has transitioned to comfort care. Currently has a diet order in, allow pt to eat as will.   No further nutrition interventions planned at this time. Please re-consult as needed.   Hermina Barters RD, LDN Clinical Dietitian See Shea Evans for contact information.

## 2022-05-19 NOTE — Progress Notes (Signed)
Progress Note   Patient: Krista Mason SKA:[id number] DOB: May 24, 1936 DOA: 05/13/2022     6 DOS: the patient was seen and examined on 05/19/2022   Brief hospital course: Mrs. Snooks was admitted to the hospital with the working diagnosis of pneumonia, complicated with acute hypoxemic respiratory failure.   86 yo female with the past medical history of chronic anemia, atrial fibrillation, diastolic heart failure, hypertension, chronic kidney disease stage IV, and dyslipidemia who presented with dyspnea. Patient with worsening symptoms for 7 days, including dyspnea and cough, associated elevated temperature up to 100.2. EMS was called and she was found hypoxic with 02 saturation 74%, she was placed on C pap and transported to the ED.  Recent hospitalization for GI bleed and symptomatic anemia 06/23.   On her initial physical examination her blood pressure was 108/89, HR 98 and RR 23 with 02 saturation 97% on supplemental 02. Lungs with coarse breath sounds bilaterally, decreased air movement right upper lobe, heart with S1 and S2 present irregularly irregular with no gallops, abdomen not distended, and no lower extremity edema.   Na 132, K 4,2 CL 96, bicarbonate 25, glucose 293 bun 42 cr 2,65  BNP 1,351 Lactic acid 2.1 Wbc  15.3 hgb 10.2 plt 222    Blood cultures, positive for haemophilus influenzae and staphylococcus epidermidis.  Sars covid 19 negative  Urine analysis with SG 1,012 negative leukocytes   Chest radiograph with dense alveolar infiltrate at the right upper lobe. No effusions, pacemaker in place with one atrial and one ventricular lead.   EKG 102 bpm, left axis deviation and left bundle branch block, intermittent a sensed and v paced, with intermittent atrial fibrillation, positive PAC and PVC, no significant ST segment or T wave changes.   Patient was placed on broad spectrum antibiotic therapy. Initially with high oxygen requirements requiring Bipap and 20 L/min per HFNC.    Slowly improving oxygenation. Antibiotic therapy narrowed to ampicillin.      Assessment and Plan: * sepsis secondary to hospital-acquired pneumonia Right upper lobe pneumonia,due to Hemophilus  Complicated with severe sepsis present on admission.  Acute hypoxemic respiratory failure.   Patient continue very weak and deconditioned, persistent dyspnea.  02 saturation is 99 on 5 L per HFNC  RR in th 20's  Wbc worsening at 15,2  Follow up chest radiograph with persistent large infiltrate at the right upper lobe.   Patient with rapid decline in her physical functional status. Despite medical therapy she continue to decline.  She has decided to transition to comfort care. Will continue supportive 02 per [place], analgesics and bronchodilators.    Acute on chronic combined systolic and diastolic CHF (congestive heart failure) (HCC) Echocardiogram with mild reduction in systolic function 40 to 20%, global hypkinesis, internal cavity with mild dilatation, LVH, RV systolic function preserved, RVSP 53,9, small pericardial effusion. Moderate to severe mitral valve regurgitation, moderate tricuspid regurgitation.  Acute on chronic core pulmonale.   Urine output is 1,900 ml.  Systolic blood pressure is 148 to 150 mmHg.   Continue palliative diuresis with furosemide.  Poor prognosis.        Acute renal failure superimposed on stage 4 chronic kidney disease ([place]) Renal function with serum cr at 1,9 with K at 3.7 and serum bicarbonate at 30.  Continue palliative diuresis with furosemide.   Paroxysmal atrial fibrillation (HCC)  Discontinue telemetry monitoring.  Patient has transitioned to comfort care, amiodarone has been discontinued.   Type 2 diabetes mellitus with hyperlipidemia (HCC) Insulin  therapy has been discontinued  Not further candidate for statin therapy.    Microcytic anemia Anemia of chronic disease.        Subjective: Patient feeling tired and fatigued. Positive  constipation. She expressed agreement in comfort measures.   Physical Exam: Vitals:   05/19/22 0331 05/19/22 0556 05/19/22 0816 05/19/22 0907  BP: (!) 148/96   (!) 156/82  Pulse: 100  (!) 115 (!) 102  Resp: 20  (!) 24 20  Temp: 97.7 F (36.5 C)   98.7 F (37.1 C)  TempSrc: Oral   Oral  SpO2: 98%  96% 99%  Weight:  68.4 kg    Height:       Neurology awake and alert, very deconditioned and ill looking appearing ENT with mild pallor Cardiovascular with S1 and S2 present, tachycardic and irregular Respiratory with bilateral rhonchi Abdomen not distended No lower extremity edema  Data Reviewed:    Family Communication: I spoke over the phone with the patient's daughter about patient's  condition, plan of care, prognosis and all questions were addressed.   Disposition: Status is: Inpatient Remains inpatient appropriate because: comfort care imminent death   Planned Discharge Destination:  possible residential hospice      Author: Tawni Millers, MD 05/19/2022 4:24 PM  For on call review [email or web address].

## 2022-05-19 NOTE — Progress Notes (Signed)
Gave report to [place] on 6N21; patient is ready to transfer and room ready for transfer. Patient family aware of the new room.

## 2022-05-20 DIAGNOSIS — I5043 Acute on chronic combined systolic (congestive) and diastolic (congestive) heart failure: Secondary | ICD-10-CM | POA: Diagnosis not present

## 2022-05-20 DIAGNOSIS — Z515 Encounter for palliative care: Secondary | ICD-10-CM | POA: Diagnosis not present

## 2022-05-20 DIAGNOSIS — Y95 Nosocomial condition: Secondary | ICD-10-CM | POA: Diagnosis not present

## 2022-05-20 DIAGNOSIS — N179 Acute kidney failure, unspecified: Secondary | ICD-10-CM | POA: Diagnosis not present

## 2022-05-20 DIAGNOSIS — J189 Pneumonia, unspecified organism: Secondary | ICD-10-CM | POA: Diagnosis not present

## 2022-05-20 MED ORDER — HYDROMORPHONE HCL 1 MG/ML IJ SOLN
0.5000 mg | INTRAMUSCULAR | Status: DC
  Administered 2022-05-20 – 2022-05-21 (×3): 1 mg via INTRAVENOUS
  Filled 2022-05-20 (×3): qty 1

## 2022-05-20 NOTE — TOC Progression Note (Signed)
Transition of Care [facility]) - Progression Note    Patient Details  Name: Krista Mason MRN: [id number] Date of Birth: Sep 18, 1936  Transition of Care [facility]) CM/SW Contact  Marcelline Deist Donavan Foil, [place] Phone Number: 05/20/2022, 3:31 PM  Clinical Narrative:    [facility] [place] [facility] for residential hospice care/placement. CSW spoke with pt's daughter, Davy Pique, who agrees to above.    Expected Discharge Plan: [place] Barriers to Discharge: Continued Medical Work up  Expected Discharge Plan and Services Expected Discharge Plan: [place] In-house Referral: Clinical Social Work   Post Acute Care Choice: Hospice Living arrangements for the past 2 months: Single Family Home                                       Social Determinants of Health (SDOH) Interventions    Readmission Risk Interventions     No data to display

## 2022-05-20 NOTE — Progress Notes (Signed)
Pt has been reviewed for admission to [facility] in [place]. I have talked to her daughter Davy Pique and she and her family are in agreement for comfort care.  Pt will be presented to our MD in the morning for approval.  Feel free to contact Hospice liaison at [phone] with any questions. Lin Landsman, RN BSN [place].

## 2022-05-20 NOTE — Progress Notes (Addendum)
Palliative Medicine Inpatient Follow Up Note  HPI: 86 y.o. female  with past medical history of atrial fibrillation (not on AC due to history of GI bleed), chronic diastolic heart failure, pacemaker placement, CKD stage IV, type 2 diabetes, chronic anemia, hypertension, and hyperlipidemia who presented to the [facility] ED on 05/13/2022 with shortness of breath.  Chest x-ray showed dense alveolar infiltrate at the right upper lobe.  Initially with high oxygen requirements requiring BiPAP and 20 L/min per HFNC.  Admitted to [facility] with pneumonia and acute respiratory failure. Palliative medicine has been asked to assist with goals of care discussions.  Today's Discussion 05/20/2022  *Please note that this is a verbal dictation therefore any spelling or grammatical errors are due to the "[employer]" system interpretation.  Chart reviewed inclusive of vital signs, progress notes, laboratory results, and diagnostic images.   Has received dilaudid 24m once in the last 24 hours.   I met with Krista Mason at bedside she was joined by her son, daughter-in-law, and 2 grandchildren.    Krista Mason endorses complaints of back pain and request repositioning which was done with the NT. She also shares some "heart flutters". We discussed her current health state and her goals for comfort.  Reviewed that presently she is receiving medications such as Dilaudid and Ativan to alleviate her shortness of breath and anxiety.    Both Krista Mason and her family both feel that their questions had been answered thoroughly yesterday and the goals are for family to visit and spend time with her.  Palliative Support Provided.   Objective Assessment: Vital Signs Vitals:   05/20/22 0438 05/20/22 0831  BP: (!) 148/91   Pulse: (!) 110 75  Resp: 20 17  Temp: 97.8 F (36.6 C)   SpO2: 96% 97%    Intake/Output Summary (Last 24 hours) at 05/20/2022 1127 Last data filed at 05/20/2022 1111 Gross per 24 hour  Intake --  Output 500 ml   Net -500 ml   Last Weight  Most recent update: 05/19/2022  5:58 AM    Weight  68.4 kg (150 lb 12.7 oz)            Gen:  Elderly caucasian F in NAD HEENT: moist mucous membranes CV: Irregular rate and rhythm PULM: ON 3LPM [name], unlabored breathing ABD: soft/nontender/nondistended/normal bowel sounds EXT: BLE edema Neuro: Alert and oriented x3  SUMMARY OF RECOMMENDATIONS   DNAR/DNI Full comfort measures initiated Added orders for symptom management at EOL Patient appears stable for transition to inpatient hospice --> Have alerted TOC PMT will continue to follow   Symptom Management:  Scheduled Dilaudid 0.5 to 1 mg IV every 4 hours PRN Dilaudid 0.5 to 1 mg IV every 4 hours as needed for breakthrough pain or dyspnea Lorazepam (ATIVAN) prn for anxiety Glycopyrrolate (ROBINUL) for excessive secretions Ondansetron (ZOFRAN) prn for nausea Polyvinyl alcohol (LIQUIFILM TEARS) prn for dry eyes Antiseptic oral rinse (BIOTENE) prn for dry mouth  Billing based on MDM: High  Problems Addressed: One acute or chronic illness or injury that poses a threat to life or bodily function  Amount and/or Complexity of Data: Category 3:Discussion of management or test interpretation with external physician/other qualified health care professional/appropriate source (not separately reported)  Risks: Decision not to resuscitate or to de-escalate care because of poor prognosis ______________________________________________________________________________________ [place] Cell Phone: [phone] utilize secure chat with additional questions, if there is no response within 30 minutes please call the above phone  number  Palliative Medicine Team providers are available by phone from 7am to 7pm daily and can be reached through the team cell phone.  Should this patient require assistance outside of these hours, please call the patient's attending  physician.

## 2022-05-20 NOTE — Plan of Care (Signed)
  Problem: Pain Managment: Goal: General experience of comfort will improve Outcome: Progressing   Problem: Safety: Goal: Ability to remain free from injury will improve Outcome: Progressing   Problem: Nutrition: Goal: Adequate nutrition will be maintained Outcome: Not Progressing   

## 2022-05-20 NOTE — TOC Progression Note (Signed)
Transition of Care (TOC) - Progression Note   CSW advised today that pt will now be needing residential hospice at dc. CSW spoke with pt's daughter, Davy Pique, by phone today who is agreeable to seeking bed at [facility] and/or [facility]. Referral placed to both and pt is being assessed- await updates on bed availability.   Patient Details  Name: Krista Mason MRN: [id number] Date of Birth: [birth year]  Transition of Care [facility]) CM/SW Contact  Marcelline Deist Donavan Foil, [place] Phone Number: 05/20/2022, 12:09 PM  Clinical Narrative:       Expected Discharge Plan: [place] Barriers to Discharge: Continued Medical Work up  Expected Discharge Plan and Services Expected Discharge Plan: [place] In-house Referral: Clinical Social Work   Post Acute Care Choice: Hospice Living arrangements for the past 2 months: Single Family Home                                       Social Determinants of Health (SDOH) Interventions    Readmission Risk Interventions     No data to display

## 2022-05-20 NOTE — Progress Notes (Addendum)
Progress Note   Patient: Krista Mason HKV:[id number] DOB: October 14, 1936 DOA: 05/13/2022     7 DOS: the patient was seen and examined on 05/20/2022   Brief hospital course: Mrs. Parmar was admitted to the hospital with the working diagnosis of pneumonia, complicated with acute hypoxemic respiratory failure.   86 yo female with the past medical history of chronic anemia, atrial fibrillation, diastolic heart failure, hypertension, chronic kidney disease stage IV, and dyslipidemia who presented with dyspnea. Patient with worsening symptoms for 7 days, including dyspnea and cough, associated elevated temperature up to 100.2. EMS was called and she was found hypoxic with 02 saturation 74%, she was placed on C pap and transported to the ED.  Recent hospitalization for GI bleed and symptomatic anemia 06/23.   On her initial physical examination her blood pressure was 108/89, HR 98 and RR 23 with 02 saturation 97% on supplemental 02. Lungs with coarse breath sounds bilaterally, decreased air movement right upper lobe, heart with S1 and S2 present irregularly irregular with no gallops, abdomen not distended, and no lower extremity edema.   Na 132, K 4,2 CL 96, bicarbonate 25, glucose 293 bun 42 cr 2,65  BNP 1,351 Lactic acid 2.1 Wbc  15.3 hgb 10.2 plt 222    Blood cultures, positive for haemophilus influenzae and staphylococcus epidermidis.  Sars covid 19 negative  Urine analysis with SG 1,012 negative leukocytes   Chest radiograph with dense alveolar infiltrate at the right upper lobe. No effusions, pacemaker in place with one atrial and one ventricular lead.   EKG 102 bpm, left axis deviation and left bundle branch block, intermittent a sensed and v paced, with intermittent atrial fibrillation, positive PAC and PVC, no significant ST segment or T wave changes.   Patient was placed on broad spectrum antibiotic therapy. Initially with high oxygen requirements requiring Bipap and 20 L/min per HFNC.    Slowly improving oxygenation. Antibiotic therapy narrowed to ampicillin.   Patient very weak and deconditioned, rapid worsening physical functional status. She has decided to transition to hospice care.   Assessment and Plan: * sepsis secondary to hospital-acquired pneumonia Right upper lobe pneumonia,due to Hemophilus  Complicated with severe sepsis present on admission.  Acute hypoxemic respiratory failure.   Follow up chest radiograph with worsening infiltrates.   Patient has been transitioned to comfort care, continue with hydromorphone as needed for dyspnea and palliative supplemental 02 per [place]  Acute on chronic combined systolic and diastolic CHF (congestive heart failure) (HCC) Echocardiogram with mild reduction in systolic function 40 to 56%, global hypkinesis, internal cavity with mild dilatation, LVH, RV systolic function preserved, RVSP 53,9, small pericardial effusion. Moderate to severe mitral valve regurgitation, moderate tricuspid regurgitation.  Acute on chronic core pulmonale.   Continue palliative diuresis with furosemide.         Acute renal failure superimposed on stage 4 chronic kidney disease ([place]) No further blood work.   Paroxysmal atrial fibrillation (HCC) Comfort care, telemetry has been discontinued.   Type 2 diabetes mellitus with hyperlipidemia (HCC) Insulin therapy has been discontinued  Not further candidate for statin therapy.    Microcytic anemia Anemia of chronic disease.          Subjective: Patient has transitioned to comfort measures, her dyspnea is better controlled with opiate analgesics.   Physical Exam: Vitals:   05/19/22 0907 05/19/22 2046 05/20/22 0438 05/20/22 0831  BP: (!) 156/82 (!) 146/78 (!) 148/91   Pulse: (!) 102 (!) 107 (!) 110 75  Resp:  $'20 20 20 17  'x$ Temp: 98.7 F (37.1 C) (!) 97.5 F (36.4 C) 97.8 F (36.6 C)   TempSrc: Oral Oral Oral   SpO2: 99% 96% 96% 97%  Weight:      Height:       Neurology  somnolent but easy to arouse ENT with mild pallor Cardiovascular with S1 and S2 present  Lungs with no wheezing Abdomen not distended No lower extremity edema  Data Reviewed:   Family Communication: I spoke with patient's son and family members at the bedside, we talked in detail about patient's condition, plan of care and prognosis and all questions were addressed.   Disposition: Status is: Inpatient Remains inpatient appropriate because: pending transfer to residential hospice   Planned Discharge Destination:  hospice     Author: Tawni Millers, MD 05/20/2022 3:51 PM  For on call review [email or web address].

## 2022-05-21 DIAGNOSIS — I480 Paroxysmal atrial fibrillation: Secondary | ICD-10-CM

## 2022-05-21 DIAGNOSIS — I5043 Acute on chronic combined systolic (congestive) and diastolic (congestive) heart failure: Secondary | ICD-10-CM | POA: Diagnosis not present

## 2022-05-21 DIAGNOSIS — E1169 Type 2 diabetes mellitus with other specified complication: Secondary | ICD-10-CM | POA: Diagnosis not present

## 2022-05-21 DIAGNOSIS — N179 Acute kidney failure, unspecified: Secondary | ICD-10-CM | POA: Diagnosis not present

## 2022-05-21 DIAGNOSIS — J189 Pneumonia, unspecified organism: Secondary | ICD-10-CM | POA: Diagnosis not present

## 2022-05-21 NOTE — Progress Notes (Signed)
Called [employer] to have pacemaker turned off. Rep will call me back.

## 2022-05-21 NOTE — Plan of Care (Signed)
  Problem: Clinical Measurements: Goal: Ability to maintain a body temperature in the normal range will improve Outcome: Progressing

## 2022-05-21 NOTE — Progress Notes (Signed)
Manufacturing engineer [facility]) Hospital Liaison Note  Referral received for patient/family interest in [facility]. Spoke with daughter, family lives in [place]. Their facility is closer, and they have a bed today.   Please call with any questions or concerns. Thank you  Roselee Nova, [place] [facility] Liaison [phone]

## 2022-05-21 NOTE — Progress Notes (Addendum)
To report to [place] at [facility] Per Chris-IV to remain in place

## 2022-05-21 NOTE — Progress Notes (Signed)
Patient TH:YHOOIL C Dilling      DOB: October 04, 1936      NZV:[id number]      Palliative Medicine Team    Subjective: Bedside symptom check completed. Multiple family members bedside at time of visit.   Physical exam: Patient resting in bed with eyes closed at time of visit. Breathing even, slightly labored with nasal cannula applied, no excessive secretions noted. Patient denies pain at this time, noted to be slightly diaphoretic. Family and patient endorse she just got pain medication and that she was working harder to breathe prior to receiving. Patient becoming more drowsy during visit, with effect from analgesic.    Assessment and plan: Family without concerns or needs at this time. They are preparing for residential hospice transfer. Patient remains stable for transfer this morning. Bedside RN without needs or concerns this morning. Will continue to follow for any changes or advances.    Thank you for allowing the Palliative Medicine Team to assist in the care of this patient.     Damian Leavell, MSN, RN Palliative Medicine Team Team Phone: [phone]  This phone is monitored 7a-7p, please reach out to attending physician outside of these hours for urgent needs.

## 2022-05-21 NOTE — Discharge Summary (Addendum)
Physician Discharge Summary   Patient: Krista Mason MRN: [id number] DOB: April 16, 1936  Admit date:     05/13/2022  Discharge date: 05/21/22  Discharge Physician: Jimmy Picket Laconda Basich   PCP: Lawerance Cruel, MD   Recommendations at discharge:    Patient with very poor prognosis, rapid decline despite aggressive medical therapy. She has been transitioned to comfort care per her and her family request.  Stable to be transferred to residential hospice.   I spoke with patient's daughter at the bedside, we talked in detail about patient's condition, plan of care and prognosis and all questions were addressed.   Discharge Diagnoses: Principal Problem:   sepsis secondary to hospital-acquired pneumonia Active Problems:   Acute on chronic combined systolic and diastolic CHF (congestive heart failure) (HCC)   Acute renal failure superimposed on stage 4 chronic kidney disease (HCC)   Paroxysmal atrial fibrillation (HCC)   Type 2 diabetes mellitus with hyperlipidemia (HCC)   Microcytic anemia  Resolved Problems:   * No resolved hospital problems. [facility] Course: Krista Mason was admitted to the hospital with the working diagnosis of pneumonia, complicated with acute hypoxemic respiratory failure.   86 yo female with the past medical history of chronic anemia, atrial fibrillation, diastolic heart failure, hypertension, chronic kidney disease stage IV, and dyslipidemia who presented with dyspnea. Patient with worsening symptoms for 7 days, including dyspnea and cough, associated elevated temperature up to 100.2. EMS was called and she was found hypoxic with 02 saturation 74%, she was placed on C pap and transported to the ED.  Recent hospitalization for GI bleed and symptomatic anemia 06/23.   On her initial physical examination her blood pressure was 108/89, HR 98 and RR 23 with 02 saturation 97% on supplemental 02. Lungs with coarse breath sounds bilaterally, decreased air movement right  upper lobe, heart with S1 and S2 present irregularly irregular with no gallops, abdomen not distended, and no lower extremity edema.   Na 132, K 4,2 CL 96, bicarbonate 25, glucose 293 bun 42 cr 2,65  BNP 1,351 Lactic acid 2.1 Wbc  15.3 hgb 10.2 plt 222    Blood cultures, positive for haemophilus influenzae and staphylococcus epidermidis.  Sars covid 19 negative  Urine analysis with SG 1,012 negative leukocytes   Chest radiograph with dense alveolar infiltrate at the right upper lobe. No effusions, pacemaker in place with one atrial and one ventricular lead.   EKG 102 bpm, left axis deviation and left bundle branch block, intermittent a sensed and v paced, with intermittent atrial fibrillation, positive PAC and PVC, no significant ST segment or T wave changes.   Patient was placed on broad spectrum antibiotic therapy. Initially with high oxygen requirements requiring Bipap and 20 L/min per HFNC.   Slowly improving oxygenation but not back to baseline. Antibiotic therapy narrowed to ampicillin.   Patient very weak and deconditioned, rapid worsening physical functional status. Follow up chest radiograph with worsening pneumonia.   Very poor prognosis due to her rapid decline and poor response to aggressive medical therapy. She decided to transition to comfort care, continue care at a residential hospice.   Assessment and Plan: * sepsis secondary to hospital-acquired pneumonia Right upper lobe pneumonia,due to Hemophilus  Complicated with severe sepsis present on admission.  Acute hypoxemic respiratory failure.   Follow up chest radiograph with worsening infiltrates.   Patient has been transitioned to comfort care, continue with hydromorphone as needed for dyspnea and palliative supplemental 02 per [place]  Acute on chronic  combined systolic and diastolic CHF (congestive heart failure) (HCC) Echocardiogram with mild reduction in systolic function 40 to 03%, global hypkinesis, internal  cavity with mild dilatation, LVH, RV systolic function preserved, RVSP 53,9, small pericardial effusion. Moderate to severe mitral valve regurgitation, moderate tricuspid regurgitation.  Acute on chronic core pulmonale.   Patient received palliative diuresis during her last days of hospitalization.   Acute renal failure superimposed on stage 4 chronic kidney disease ([place]) No further blood work.   Paroxysmal atrial fibrillation (HCC) Comfort care, telemetry has been discontinued.   Type 2 diabetes mellitus with hyperlipidemia (HCC) Insulin therapy has been discontinued  Not further candidate for statin therapy.   Microcytic anemia Anemia of chronic disease.     Consultants: Palliative care  Procedures performed: none   Disposition:  residential hospice  Diet recommendation:  Regular diet DISCHARGE MEDICATION: Allergies as of 05/21/2022       Reactions   Retacrit [epoetin (alfa)] Other (See Comments)   Chills, low grade fever, body aches, fatigue, nausea for 3 days.   Crestor [rosuvastatin] Nausea Only   Hydrocodone Itching, Other (See Comments)   And the patient "got mean"   Influenza Vac Split Quad Swelling, Other (See Comments)   Local swelling and fever   Tape Other (See Comments)   The skin tears easily   Cefaclor Itching   Crestor [rosuvastatin Calcium] Nausea Only   Influenza Vaccines Swelling, Other (See Comments)   Swelling and redness at injection site, fever   Lidocaine Hcl Palpitations, Other (See Comments)   Heart racing if Lidocaine is with EPI   Nsaids Other (See Comments)   Pt states she is not taking because of kidney function   Percocet [oxycodone-acetaminophen] Nausea And Vomiting   Prednisone Other (See Comments)   Dizziness, spiked blood sugar        Medication List     STOP taking these medications    acetaminophen 500 MG tablet Commonly known as: TYLENOL   amiodarone 100 MG tablet Commonly known as: PACERONE   benzonatate 100 MG  capsule Commonly known as: TESSALON   carvedilol 12.5 MG tablet Commonly known as: COREG   cholecalciferol 25 MCG (1000 UNIT) tablet Commonly known as: VITAMIN D3   diphenhydrAMINE 25 mg capsule Commonly known as: BENADRYL   Ensure   HM LIDOCAINE PATCH EX   hydrALAZINE 50 MG tablet Commonly known as: APRESOLINE   ondansetron 4 MG disintegrating tablet Commonly known as: ZOFRAN-ODT   pantoprazole 40 MG tablet Commonly known as: PROTONIX   Polyethyl Glycol-Propyl Glycol 0.4-0.3 % Soln   polyethylene glycol 17 g packet Commonly known as: MIRALAX / GLYCOLAX   sucralfate 1 GM/10ML suspension Commonly known as: Carafate   torsemide 20 MG tablet Commonly known as: DEMADEX   VITAMIN B 12 PO        Discharge Exam: Filed Weights   05/13/22 1527 05/17/22 0500 05/19/22 0556  Weight: 65.3 kg 67.4 kg 68.4 kg   BP (!) 144/84 (BP Location: Left Arm)   Pulse (!) 110   Temp (!) 97.4 F (36.3 C) (Oral)   Resp 14   Ht '5\' 5"'$  (1.651 m)   Wt 68.4 kg   SpO2 98%   BMI 25.09 kg/m   Patient deconditioned  Neurology somnolent but easy to arouse ENT with mild pallor Cardiovascular with S1 and S2 present irregular Respiratory with bilateral rhonchi and rales Abdomen not distended No lower extremity edema   Condition at discharge: stable  The results of significant diagnostics  from this hospitalization (including imaging, microbiology, ancillary and laboratory) are listed below for reference.   Imaging Studies: DG Chest 1 View  Result Date: 05/18/2022 CLINICAL DATA:  Follow-up pneumonia, shortness of breath EXAM: CHEST  1 VIEW COMPARISON:  05/13/2022 FINDINGS: Persistent right upper lobe airspace disease with improved aeration. Interval development of bilateral lower lobe airspace disease. No pleural effusion or pneumothorax. Stable cardiomegaly. Three lead cardiac pacemaker. Calcified left hilar lymph node likely reflecting sequela prior granulomatous disease. No acute  osseous abnormality. IMPRESSION: 1. Persistent right upper lobe pneumonia with improved aeration compared with the prior exam. Interval development of bilateral lower lobe airspace disease concerning for pneumonia. Electronically Signed   By: Kathreen Devoid M.D.   On: 05/18/2022 15:51   ECHOCARDIOGRAM COMPLETE  Result Date: 05/14/2022    ECHOCARDIOGRAM REPORT   Patient Name:   TAMICO MUNDO [place] Date of Exam: 05/14/2022 Medical Rec #:  [id number]     Height:       65.0 in Accession #:    [id number]    Weight:       144.0 lb Date of Birth:  1936-04-22     BSA:          1.720 m Patient Age:    59 years      BP:           125/90 mmHg Patient Gender: F             HR:           87 bpm. Exam Location:  Inpatient Procedure: 2D Echo, 3D Echo, Cardiac Doppler and Color Doppler Indications:    I50.40* Unspecified combined systolic (congestive) and diastolic                 (congestive) heart failure  History:        Patient has prior history of Echocardiogram examinations, most                 recent 10/12/2021. CHF and Cardiomyopathy, Abnormal ECG and                 Pacemaker, Arrythmias:PVC and ATACH, Signs/Symptoms:Chest Pain;                 Risk Factors:Hypertension, Dyslipidemia, Diabetes and Current                 Smoker.  Sonographer:    Roseanna Rainbow RDCS Referring Phys: [id number] [place]  1. Left ventricular ejection fraction, by estimation, is 40 to 45%. The left ventricle has mildly decreased function. The left ventricle demonstrates global hypokinesis. The left ventricular internal cavity size was mildly dilated. There is mild left ventricular hypertrophy. Left ventricular diastolic parameters are indeterminate.  2. PPM leads in RA/RV . Right ventricular systolic function is normal. The right ventricular size is normal. There is moderately elevated pulmonary artery systolic pressure.  3. Left atrial size was mildly dilated.  4. A small pericardial effusion is present. The pericardial effusion is  posterior to the left ventricle.  5. The mitral valve is abnormal. Moderate to severe mitral valve regurgitation. No evidence of mitral stenosis.  6. Tricuspid valve regurgitation is moderate.  7. The aortic valve is tricuspid. There is moderate calcification of the aortic valve. There is moderate thickening of the aortic valve. Aortic valve regurgitation is mild. Aortic valve sclerosis/calcification is present, without any evidence of aortic stenosis.  8. The inferior vena cava is dilated in size with >50% respiratory variability,  suggesting right atrial pressure of 8 mmHg. FINDINGS  Left Ventricle: Left ventricular ejection fraction, by estimation, is 40 to 45%. The left ventricle has mildly decreased function. The left ventricle demonstrates global hypokinesis. The left ventricular internal cavity size was mildly dilated. There is  mild left ventricular hypertrophy. Left ventricular diastolic parameters are indeterminate. Right Ventricle: PPM leads in RA/RV. The right ventricular size is normal. No increase in right ventricular wall thickness. Right ventricular systolic function is normal. There is moderately elevated pulmonary artery systolic pressure. The tricuspid regurgitant velocity is 3.12 m/s, and with an assumed right atrial pressure of 15 mmHg, the estimated right ventricular systolic pressure is 16.1 mmHg. Left Atrium: Left atrial size was mildly dilated. Right Atrium: Right atrial size was normal in size. Pericardium: A small pericardial effusion is present. The pericardial effusion is posterior to the left ventricle. Mitral Valve: The mitral valve is abnormal. There is moderate thickening of the mitral valve leaflet(s). There is moderate calcification of the mitral valve leaflet(s). Mild mitral annular calcification. Moderate to severe mitral valve regurgitation. No evidence of mitral valve stenosis. MV peak gradient, 15.8 mmHg. The mean mitral valve gradient is 6.0 mmHg. Tricuspid Valve: The  tricuspid valve is normal in structure. Tricuspid valve regurgitation is moderate . No evidence of tricuspid stenosis. Aortic Valve: The aortic valve is tricuspid. There is moderate calcification of the aortic valve. There is moderate thickening of the aortic valve. Aortic valve regurgitation is mild. Aortic valve sclerosis/calcification is present, without any evidence of aortic stenosis. Aortic valve mean gradient measures 4.0 mmHg. Aortic valve peak gradient measures 7.4 mmHg. Aortic valve area, by VTI measures 2.61 cm. Pulmonic Valve: The pulmonic valve was normal in structure. Pulmonic valve regurgitation is mild. No evidence of pulmonic stenosis. Aorta: The aortic root is normal in size and structure. Venous: The inferior vena cava is dilated in size with greater than 50% respiratory variability, suggesting right atrial pressure of 8 mmHg. IAS/Shunts: No atrial level shunt detected by color flow Doppler. Additional Comments: A device lead is visualized.  LEFT VENTRICLE PLAX 2D LVIDd:         3.90 cm LVIDs:         2.80 cm LV PW:         2.20 cm LV IVS:        1.30 cm LVOT diam:     1.90 cm     3D Volume EF: LV SV:         62          3D EF:        34 % LV SV Index:   36          LV EDV:       123 ml LVOT Area:     2.84 cm    LV ESV:       81 ml                            LV SV:        42 ml  LV Volumes (MOD) LV vol d, MOD A2C: 84.0 ml LV vol d, MOD A4C: 93.8 ml LV vol s, MOD A2C: 41.1 ml LV vol s, MOD A4C: 66.6 ml LV SV MOD A2C:     42.9 ml LV SV MOD A4C:     93.8 ml LV SV MOD BP:      36.7 ml RIGHT VENTRICLE  IVC RV S prime:     11.24 cm/s  IVC diam: 2.00 cm TAPSE (M-mode): 1.5 cm LEFT ATRIUM              Index        RIGHT ATRIUM           Index LA diam:        4.00 cm  2.33 cm/m   RA Area:     13.90 cm LA Vol (A2C):   100.0 ml 58.13 ml/m  RA Volume:   33.10 ml  19.24 ml/m LA Vol (A4C):   57.8 ml  33.60 ml/m LA Biplane Vol: 77.1 ml  44.82 ml/m  AORTIC VALVE                    PULMONIC  VALVE AV Area (Vmax):    2.48 cm     PV Vmax:          3.03 m/s AV Area (Vmean):   2.52 cm     PV Peak grad:     36.7 mmHg AV Area (VTI):     2.61 cm     PR End Diast Vel: 2.32 msec AV Vmax:           136.00 cm/s AV Vmean:          96.000 cm/s AV VTI:            0.238 m AV Peak Grad:      7.4 mmHg AV Mean Grad:      4.0 mmHg LVOT Vmax:         119.00 cm/s LVOT Vmean:        85.400 cm/s LVOT VTI:          0.219 m LVOT/AV VTI ratio: 0.92  AORTA Ao Root diam: 3.70 cm Ao Asc diam:  3.70 cm MITRAL VALVE                  TRICUSPID VALVE MV Area (PHT): 4.49 cm       TR Peak grad:   38.9 mmHg MV Area VTI:   1.96 cm       TR Vmax:        312.00 cm/s MV Peak grad:  15.8 mmHg MV Mean grad:  6.0 mmHg       SHUNTS MV Vmax:       1.99 m/s       Systemic VTI:  0.22 m MV Vmean:      109.0 cm/s     Systemic Diam: 1.90 cm MV Decel Time: 169 msec MR Peak grad:    107.7 mmHg MR Mean grad:    71.0 mmHg MR Vmax:         519.00 cm/s MR Vmean:        401.0 cm/s MR PISA:         1.27 cm MR PISA Eff ROA: 9 mm MR PISA Radius:  0.45 cm MV E velocity: 176.00 cm/s Jenkins Rouge MD Electronically signed by Jenkins Rouge MD Signature Date/Time: 05/14/2022/2:14:16 PM    Final    DG Chest Port 1 View  Result Date: 05/13/2022 CLINICAL DATA:  Shortness of breath, possible sepsis EXAM: PORTABLE CHEST 1 VIEW COMPARISON:  05/10/2022 FINDINGS: Cardiomegaly. Left chest multi lead pacer. Calcified AP window lymph nodes. Very extensive new heterogeneous and consolidative airspace opacity of the right upper lobe. The visualized skeletal structures are unremarkable. IMPRESSION: 1. Very extensive new heterogeneous and consolidative airspace opacity of  the right upper lobe, concerning for infection. 2.  Cardiomegaly. Electronically Signed   By: Delanna Ahmadi M.D.   On: 05/13/2022 15:50   DG Chest 2 View  Result Date: 05/10/2022 CLINICAL DATA:  Productive cough x4 days EXAM: CHEST - 2 VIEW COMPARISON:  Previous studies including the examination of  04/12/2022 FINDINGS: Transverse diameter of Elnoria Howard is increased. Biventricular pacer leads are noted in place. Low position of the diaphragms suggests COPD. Calcified nodes are seen in mediastinum. There are no new infiltrates or signs of pulmonary edema. There is no pleural effusion or pneumothorax. IMPRESSION: Cardiomegaly. COPD. There are no new infiltrates or signs of pulmonary edema. Electronically Signed   By: Elmer Picker M.D.   On: 05/10/2022 12:19   CUP PACEART REMOTE DEVICE CHECK  Result Date: 05/01/2022 LV auto pacing thresholds continue to measure above LV output at 2.75v, likely inaccurate. Seen in device clinic for correlation on 08/08/21, auto that day 2.25v with manual 1.75v. Currently, programmed 2.25v. Next remote 38 days- JJB   Microbiology: Results for orders placed or performed during the hospital encounter of 05/13/22  Blood Culture (routine x 2)     Status: Abnormal   Collection Time: 05/13/22  3:24 PM   Specimen: BLOOD  Result Value Ref Range Status   Specimen Description BLOOD SITE NOT SPECIFIED  Final   Special Requests   Final    BOTTLES DRAWN AEROBIC AND ANAEROBIC Blood Culture results may not be optimal due to an inadequate volume of blood received in culture bottles   Culture (A)  Final    HAEMOPHILUS INFLUENZAE BETA LACTAMASE NEGATIVE CRITICAL RESULT CALLED TO, READ BACK BY AND VERIFIED WITH: A. PAYTES PHARMD, AT 5885 05/15/22 D. [place] NOTIFIED Referred to [facility] in [place], [place] for serotyping. Performed at [facility], [place] [address], [place], [place] [address]    Report Status 05/16/2022 FINAL  Final  Blood Culture (routine x 2)     Status: Abnormal   Collection Time: 05/13/22  3:25 PM   Specimen: BLOOD  Result Value Ref Range Status   Specimen Description BLOOD SITE NOT SPECIFIED  Final   Special Requests   Final    BOTTLES DRAWN AEROBIC AND ANAEROBIC Blood Culture adequate volume   Culture  Setup Time   Final     GRAM POSITIVE COCCI IN BOTH AEROBIC AND ANAEROBIC BOTTLES CRITICAL RESULT CALLED TO, READ BACK BY AND VERIFIED WITH: T RUDISILL,PHARMD'@0400'$  05/15/22 [place]    Culture (A)  Final    STAPHYLOCOCCUS EPIDERMIDIS THE SIGNIFICANCE OF ISOLATING THIS ORGANISM FROM A SINGLE SET OF BLOOD CULTURES WHEN MULTIPLE SETS ARE DRAWN IS UNCERTAIN. PLEASE NOTIFY THE MICROBIOLOGY DEPARTMENT WITHIN ONE WEEK IF SPECIATION AND SENSITIVITIES ARE REQUIRED. Performed at [facility], [place] [address] [place], [place] [address]    Report Status 05/17/2022 FINAL  Final  Blood Culture ID Panel (Reflexed)     Status: Abnormal   Collection Time: 05/13/22  3:25 PM  Result Value Ref Range Status   Enterococcus faecalis NOT DETECTED NOT DETECTED Final   Enterococcus Faecium NOT DETECTED NOT DETECTED Final   Listeria monocytogenes NOT DETECTED NOT DETECTED Final   Staphylococcus species DETECTED (A) NOT DETECTED Final    Comment: CRITICAL RESULT CALLED TO, READ BACK BY AND VERIFIED WITH: T RUDISILL,PHARMD'@0400'$  05/15/22 [place]    Staphylococcus aureus (BCID) NOT DETECTED NOT DETECTED Final   Staphylococcus epidermidis DETECTED (A) NOT DETECTED Final    Comment: CRITICAL RESULT CALLED  TO, READ BACK BY AND VERIFIED WITH: T RUDILSILL,PHARMD'@0400'$  05/15/22 [place]    Staphylococcus lugdunensis NOT DETECTED NOT DETECTED Final   Streptococcus species NOT DETECTED NOT DETECTED Final   Streptococcus agalactiae NOT DETECTED NOT DETECTED Final   Streptococcus pneumoniae NOT DETECTED NOT DETECTED Final   Streptococcus pyogenes NOT DETECTED NOT DETECTED Final   A.calcoaceticus-baumannii NOT DETECTED NOT DETECTED Final   Bacteroides fragilis NOT DETECTED NOT DETECTED Final   Enterobacterales NOT DETECTED NOT DETECTED Final   Enterobacter cloacae complex NOT DETECTED NOT DETECTED Final   Escherichia coli NOT DETECTED NOT DETECTED Final   Klebsiella aerogenes NOT DETECTED NOT DETECTED Final   Klebsiella oxytoca NOT DETECTED NOT DETECTED  Final   Klebsiella pneumoniae NOT DETECTED NOT DETECTED Final   Proteus species NOT DETECTED NOT DETECTED Final   Salmonella species NOT DETECTED NOT DETECTED Final   Serratia marcescens NOT DETECTED NOT DETECTED Final   Haemophilus influenzae NOT DETECTED NOT DETECTED Final   Neisseria meningitidis NOT DETECTED NOT DETECTED Final   Pseudomonas aeruginosa NOT DETECTED NOT DETECTED Final   Stenotrophomonas maltophilia NOT DETECTED NOT DETECTED Final   Candida albicans NOT DETECTED NOT DETECTED Final   Candida auris NOT DETECTED NOT DETECTED Final   Candida glabrata NOT DETECTED NOT DETECTED Final   Candida krusei NOT DETECTED NOT DETECTED Final   Candida parapsilosis NOT DETECTED NOT DETECTED Final   Candida tropicalis NOT DETECTED NOT DETECTED Final   Cryptococcus neoformans/gattii NOT DETECTED NOT DETECTED Final   Methicillin resistance mecA/C NOT DETECTED NOT DETECTED Final    Comment: Performed at [facility], [address], [place], [place] [address]  Urine Culture     Status: Abnormal   Collection Time: 05/13/22 10:07 PM   Specimen: In/Out Cath Urine  Result Value Ref Range Status   Specimen Description IN/OUT CATH URINE  Final   Special Requests   Final    NONE Performed at [facility], [place] [address] [place], [place] [address]    Culture 20,000 COLONIES/mL ENTEROCOCCUS FAECALIS (A)  Final   Report Status 05/15/2022 FINAL  Final   Organism ID, Bacteria ENTEROCOCCUS FAECALIS (A)  Final      Susceptibility   Enterococcus faecalis - MIC*    AMPICILLIN <=2 SENSITIVE Sensitive     NITROFURANTOIN <=16 SENSITIVE Sensitive     VANCOMYCIN 1 SENSITIVE Sensitive     * 20,000 COLONIES/mL ENTEROCOCCUS FAECALIS  MRSA Next Gen by PCR, Nasal     Status: None   Collection Time: 05/13/22 10:16 PM   Specimen: Nasal Mucosa; Nasal Swab  Result Value Ref Range Status   MRSA by PCR Next Gen NOT DETECTED NOT DETECTED Final    Comment: (NOTE) The GeneXpert MRSA Assay (FDA  approved for NASAL specimens only), is one component of a comprehensive MRSA colonization surveillance program. It is not intended to diagnose MRSA infection nor to guide or monitor treatment for MRSA infections. Test performance is not FDA approved in patients less than 32 years old. Performed at [facility], [place] [address], [place], [place] [address]     Labs: CBC: Recent Labs  Lab 05/15/22 0321 05/16/22 0300 05/17/22 0350 05/18/22 0551 05/18/22 1523  WBC 10.4 15.6* 13.6* 13.9* 15.2*  NEUTROABS  --   --   --   --  13.7*  HGB 9.9* 9.8* 10.1* 10.3* 10.8*  HCT 31.5* 30.6* 32.2* 33.3* 33.6*  MCV 92.9 91.6 92.0 92.8 91.1  PLT 231 272 298 328 335  Basic Metabolic Panel: Recent Labs  Lab 05/15/22 0321 05/16/22 0300 05/17/22 0350 05/18/22 0551 05/19/22 0307  NA 131* 131* 136 137 138  K 4.0 4.2 3.7 3.8 3.7  CL 96* 94* 99 97* 98  CO2 21* '26 26 30 30  '$ GLUCOSE 342* 292* 193* 318* 239*  BUN 62* 73* 82* 83* 83*  CREATININE 2.53* 2.43* 2.28* 2.25* 1.97*  CALCIUM 9.6 9.7 9.7 9.8 9.6  MG  --   --   --   --  2.7*   Liver Function Tests: No results for input(s): "AST", "ALT", "ALKPHOS", "BILITOT", "PROT", "ALBUMIN" in the last 168 hours. CBG: Recent Labs  Lab 05/17/22 2137 05/18/22 0805 05/18/22 1212 05/18/22 1613 05/18/22 2157  GLUCAP 172* 347* 302* 226* 199*    Discharge time spent: greater than 30 minutes.  Signed: Tawni Millers, MD [facility] 05/21/2022

## 2022-05-21 NOTE — Plan of Care (Signed)
Problem: Education: Goal: Ability to demonstrate management of disease process will improve Outcome: Adequate for Discharge Goal: Ability to verbalize understanding of medication therapies will improve Outcome: Adequate for Discharge Goal: Individualized Educational Video(s) Outcome: Adequate for Discharge   Problem: Activity: Goal: Capacity to carry out activities will improve Outcome: Adequate for Discharge   Problem: Cardiac: Goal: Ability to achieve and maintain adequate cardiopulmonary perfusion will improve Outcome: Adequate for Discharge   Problem: Activity: Goal: Ability to tolerate increased activity will improve Outcome: Adequate for Discharge   Problem: Clinical Measurements: Goal: Ability to maintain a body temperature in the normal range will improve 05/21/2022 1206 by Santa Lighter, RN Outcome: Adequate for Discharge 05/21/2022 1102 by Santa Lighter, RN Outcome: Progressing   Problem: Respiratory: Goal: Ability to maintain adequate ventilation will improve Outcome: Adequate for Discharge Goal: Ability to maintain a clear airway will improve Outcome: Adequate for Discharge   Problem: Education: Goal: Ability to describe self-care measures that may prevent or decrease complications (Diabetes Survival Skills Education) will improve Outcome: Adequate for Discharge Goal: Individualized Educational Video(s) Outcome: Adequate for Discharge   Problem: Coping: Goal: Ability to adjust to condition or change in health will improve Outcome: Adequate for Discharge   Problem: Fluid Volume: Goal: Ability to maintain a balanced intake and output will improve Outcome: Adequate for Discharge   Problem: Health Behavior/Discharge Planning: Goal: Ability to identify and utilize available resources and services will improve Outcome: Adequate for Discharge Goal: Ability to manage health-related needs will improve Outcome: Adequate for Discharge   Problem:  Metabolic: Goal: Ability to maintain appropriate glucose levels will improve Outcome: Adequate for Discharge   Problem: Nutritional: Goal: Maintenance of adequate nutrition will improve Outcome: Adequate for Discharge Goal: Progress toward achieving an optimal weight will improve Outcome: Adequate for Discharge   Problem: Skin Integrity: Goal: Risk for impaired skin integrity will decrease Outcome: Adequate for Discharge   Problem: Tissue Perfusion: Goal: Adequacy of tissue perfusion will improve Outcome: Adequate for Discharge   Problem: Education: Goal: Knowledge of General Education information will improve Description: Including pain rating scale, medication(s)/side effects and non-pharmacologic comfort measures Outcome: Adequate for Discharge   Problem: Health Behavior/Discharge Planning: Goal: Ability to manage health-related needs will improve Outcome: Adequate for Discharge   Problem: Clinical Measurements: Goal: Ability to maintain clinical measurements within normal limits will improve Outcome: Adequate for Discharge Goal: Will remain free from infection Outcome: Adequate for Discharge Goal: Diagnostic test results will improve Outcome: Adequate for Discharge Goal: Respiratory complications will improve Outcome: Adequate for Discharge Goal: Cardiovascular complication will be avoided Outcome: Adequate for Discharge   Problem: Activity: Goal: Risk for activity intolerance will decrease Outcome: Adequate for Discharge   Problem: Nutrition: Goal: Adequate nutrition will be maintained Outcome: Adequate for Discharge   Problem: Coping: Goal: Level of anxiety will decrease Outcome: Adequate for Discharge   Problem: Elimination: Goal: Will not experience complications related to bowel motility Outcome: Adequate for Discharge Goal: Will not experience complications related to urinary retention Outcome: Adequate for Discharge   Problem: Pain Managment: Goal:  General experience of comfort will improve Outcome: Adequate for Discharge   Problem: Safety: Goal: Ability to remain free from injury will improve Outcome: Adequate for Discharge   Problem: Skin Integrity: Goal: Risk for impaired skin integrity will decrease Outcome: Adequate for Discharge   Problem: Acute Rehab PT Goals(only PT should resolve) Goal: Pt Will Go Supine/Side To Sit Outcome: Adequate for Discharge Goal: Patient Will Transfer Sit To/From Stand Outcome: Adequate for  Discharge Goal: Pt Will Transfer Bed To Chair/Chair To Bed Outcome: Adequate for Discharge Goal: Pt Will Perform Standing Balance Or Pre-Gait Outcome: Adequate for Discharge Goal: Pt Will Ambulate Outcome: Adequate for Discharge   Problem: Acute Rehab OT Goals (only OT should resolve) Goal: Pt. Will Perform Grooming Outcome: Adequate for Discharge Goal: Pt. Will Perform Lower Body Bathing Outcome: Adequate for Discharge Goal: Pt. Will Perform Lower Body Dressing Outcome: Adequate for Discharge Goal: Pt. Will Transfer To Toilet Outcome: Adequate for Discharge Goal: Pt. Will Perform Toileting-Clothing Manipulation Outcome: Adequate for Discharge   Problem: Acute Rehab OT Goals (only OT should resolve) Goal: Pt. Will Perform Grooming Outcome: Adequate for Discharge Goal: Pt. Will Perform Lower Body Bathing Outcome: Adequate for Discharge Goal: Pt. Will Perform Lower Body Dressing Outcome: Adequate for Discharge Goal: Pt. Will Transfer To Toilet Outcome: Adequate for Discharge Goal: Pt. Will Perform Toileting-Clothing Manipulation Outcome: Adequate for Discharge

## 2022-05-21 NOTE — Progress Notes (Signed)
  This pt was referred to hospice services for end of life care at the [facility] in [facility]. I have been able to speak with the daughter and she confirmed interest. We did have a bed to offer and the pt's family has accepted this. We can accept the pt today.   The paperwork has been completed and our MD Dr. Micheline Rough has accepted the pt for services.   Ambulance can be arranged for first available and the nurse number to call report is [place] RN [phone]

## 2022-05-22 ENCOUNTER — Ambulatory Visit (HOSPITAL_COMMUNITY): Admit: 2022-05-22 | Payer: Medicare HMO | Admitting: Gastroenterology

## 2022-05-22 ENCOUNTER — Encounter (HOSPITAL_COMMUNITY): Payer: Self-pay

## 2022-05-22 SURGERY — ESOPHAGOGASTRODUODENOSCOPY (EGD) WITH PROPOFOL
Anesthesia: Monitor Anesthesia Care

## 2022-05-23 NOTE — Progress Notes (Signed)
Remote pacemaker transmission.   

## 2022-05-29 ENCOUNTER — Encounter (HOSPITAL_COMMUNITY): Payer: Medicare HMO

## 2022-06-05 LAB — CULTURE, BLOOD (ROUTINE X 2)

## 2022-06-06 ENCOUNTER — Encounter: Payer: Medicare HMO | Admitting: Cardiology

## 2022-06-22 DEATH — deceased

## 2022-07-24 ENCOUNTER — Encounter (HOSPITAL_COMMUNITY): Payer: Self-pay
